# Patient Record
Sex: Female | Born: 1975
Health system: Southern US, Community
[De-identification: ages and names within clinical notes are randomized; demographics above are authoritative.]

## PROBLEM LIST (undated history)

## (undated) DIAGNOSIS — L409 Psoriasis, unspecified: Secondary | ICD-10-CM

## (undated) DIAGNOSIS — R87629 Unspecified abnormal cytological findings in specimens from vagina: Secondary | ICD-10-CM

## (undated) DIAGNOSIS — J45909 Unspecified asthma, uncomplicated: Secondary | ICD-10-CM

## (undated) DIAGNOSIS — R0602 Shortness of breath: Secondary | ICD-10-CM

## (undated) DIAGNOSIS — T8859XA Other complications of anesthesia, initial encounter: Secondary | ICD-10-CM

## (undated) DIAGNOSIS — K219 Gastro-esophageal reflux disease without esophagitis: Secondary | ICD-10-CM

## (undated) DIAGNOSIS — M1990 Unspecified osteoarthritis, unspecified site: Secondary | ICD-10-CM

## (undated) DIAGNOSIS — E119 Type 2 diabetes mellitus without complications: Secondary | ICD-10-CM

## (undated) DIAGNOSIS — N3010 Interstitial cystitis (chronic) without hematuria: Secondary | ICD-10-CM

## (undated) DIAGNOSIS — Z8669 Personal history of other diseases of the nervous system and sense organs: Secondary | ICD-10-CM

## (undated) DIAGNOSIS — T7840XA Allergy, unspecified, initial encounter: Secondary | ICD-10-CM

## (undated) DIAGNOSIS — J302 Other seasonal allergic rhinitis: Secondary | ICD-10-CM

## (undated) DIAGNOSIS — R011 Cardiac murmur, unspecified: Secondary | ICD-10-CM

## (undated) DIAGNOSIS — D860 Sarcoidosis of lung: Secondary | ICD-10-CM

## (undated) DIAGNOSIS — Z87442 Personal history of urinary calculi: Secondary | ICD-10-CM

## (undated) DIAGNOSIS — D219 Benign neoplasm of connective and other soft tissue, unspecified: Secondary | ICD-10-CM

## (undated) DIAGNOSIS — E785 Hyperlipidemia, unspecified: Secondary | ICD-10-CM

## (undated) DIAGNOSIS — Z8719 Personal history of other diseases of the digestive system: Secondary | ICD-10-CM

## (undated) DIAGNOSIS — L309 Dermatitis, unspecified: Secondary | ICD-10-CM

## (undated) DIAGNOSIS — N63 Unspecified lump in unspecified breast: Secondary | ICD-10-CM

## (undated) DIAGNOSIS — F419 Anxiety disorder, unspecified: Secondary | ICD-10-CM

## (undated) HISTORY — DX: Benign neoplasm of connective and other soft tissue, unspecified: D21.9

## (undated) HISTORY — DX: Hyperlipidemia, unspecified: E78.5

## (undated) HISTORY — DX: Unspecified abnormal cytological findings in specimens from vagina: R87.629

## (undated) HISTORY — DX: Other seasonal allergic rhinitis: J30.2

## (undated) HISTORY — DX: Dermatitis, unspecified: L30.9

## (undated) HISTORY — DX: Allergy, unspecified, initial encounter: T78.40XA

## (undated) HISTORY — DX: Anxiety disorder, unspecified: F41.9

## (undated) HISTORY — PX: BREAST CYST ASPIRATION: SHX578

---

## 1995-12-28 HISTORY — PX: DILATION AND CURETTAGE OF UTERUS: SHX78

## 1998-08-20 ENCOUNTER — Emergency Department (HOSPITAL_COMMUNITY): Admission: EM | Admit: 1998-08-20 | Discharge: 1998-08-20 | Payer: Self-pay

## 1998-09-28 ENCOUNTER — Emergency Department (HOSPITAL_COMMUNITY): Admission: EM | Admit: 1998-09-28 | Discharge: 1998-09-28 | Payer: Self-pay | Admitting: Emergency Medicine

## 1998-12-26 ENCOUNTER — Emergency Department (HOSPITAL_COMMUNITY): Admission: EM | Admit: 1998-12-26 | Discharge: 1998-12-26 | Payer: Self-pay | Admitting: Emergency Medicine

## 1999-07-03 ENCOUNTER — Emergency Department (HOSPITAL_COMMUNITY): Admission: EM | Admit: 1999-07-03 | Discharge: 1999-07-03 | Payer: Self-pay

## 1999-09-07 ENCOUNTER — Emergency Department (HOSPITAL_COMMUNITY): Admission: EM | Admit: 1999-09-07 | Discharge: 1999-09-07 | Payer: Self-pay | Admitting: Emergency Medicine

## 1999-09-07 ENCOUNTER — Encounter: Payer: Self-pay | Admitting: Emergency Medicine

## 2000-04-10 ENCOUNTER — Encounter: Payer: Self-pay | Admitting: Emergency Medicine

## 2000-04-10 ENCOUNTER — Emergency Department (HOSPITAL_COMMUNITY): Admission: EM | Admit: 2000-04-10 | Discharge: 2000-04-10 | Payer: Self-pay | Admitting: Emergency Medicine

## 2000-04-11 ENCOUNTER — Emergency Department (HOSPITAL_COMMUNITY): Admission: EM | Admit: 2000-04-11 | Discharge: 2000-04-11 | Payer: Self-pay | Admitting: Emergency Medicine

## 2000-04-16 ENCOUNTER — Emergency Department (HOSPITAL_COMMUNITY): Admission: EM | Admit: 2000-04-16 | Discharge: 2000-04-16 | Payer: Self-pay

## 2000-06-20 ENCOUNTER — Emergency Department (HOSPITAL_COMMUNITY): Admission: EM | Admit: 2000-06-20 | Discharge: 2000-06-20 | Payer: Self-pay

## 2000-12-13 ENCOUNTER — Emergency Department (HOSPITAL_COMMUNITY): Admission: EM | Admit: 2000-12-13 | Discharge: 2000-12-13 | Payer: Self-pay | Admitting: *Deleted

## 2000-12-15 ENCOUNTER — Emergency Department (HOSPITAL_COMMUNITY): Admission: EM | Admit: 2000-12-15 | Discharge: 2000-12-15 | Payer: Self-pay | Admitting: Emergency Medicine

## 2001-03-06 ENCOUNTER — Emergency Department (HOSPITAL_COMMUNITY): Admission: EM | Admit: 2001-03-06 | Discharge: 2001-03-06 | Payer: Self-pay | Admitting: Emergency Medicine

## 2002-05-23 ENCOUNTER — Emergency Department (HOSPITAL_COMMUNITY): Admission: EM | Admit: 2002-05-23 | Discharge: 2002-05-23 | Payer: Self-pay | Admitting: Emergency Medicine

## 2002-08-09 ENCOUNTER — Inpatient Hospital Stay (HOSPITAL_COMMUNITY): Admission: AD | Admit: 2002-08-09 | Discharge: 2002-08-09 | Payer: Self-pay | Admitting: *Deleted

## 2003-09-12 ENCOUNTER — Emergency Department (HOSPITAL_COMMUNITY): Admission: EM | Admit: 2003-09-12 | Discharge: 2003-09-12 | Payer: Self-pay | Admitting: Emergency Medicine

## 2004-03-29 ENCOUNTER — Emergency Department (HOSPITAL_COMMUNITY): Admission: AD | Admit: 2004-03-29 | Discharge: 2004-03-29 | Payer: Self-pay | Admitting: Family Medicine

## 2004-07-31 ENCOUNTER — Emergency Department (HOSPITAL_COMMUNITY): Admission: EM | Admit: 2004-07-31 | Discharge: 2004-07-31 | Payer: Self-pay | Admitting: Family Medicine

## 2004-08-23 ENCOUNTER — Emergency Department (HOSPITAL_COMMUNITY): Admission: EM | Admit: 2004-08-23 | Discharge: 2004-08-23 | Payer: Self-pay | Admitting: Emergency Medicine

## 2004-08-23 ENCOUNTER — Inpatient Hospital Stay (HOSPITAL_COMMUNITY): Admission: AD | Admit: 2004-08-23 | Discharge: 2004-08-23 | Payer: Self-pay | Admitting: Obstetrics and Gynecology

## 2004-10-06 ENCOUNTER — Emergency Department (HOSPITAL_COMMUNITY): Admission: EM | Admit: 2004-10-06 | Discharge: 2004-10-06 | Payer: Self-pay | Admitting: Emergency Medicine

## 2004-10-19 ENCOUNTER — Emergency Department (HOSPITAL_COMMUNITY): Admission: EM | Admit: 2004-10-19 | Discharge: 2004-10-19 | Payer: Self-pay | Admitting: Emergency Medicine

## 2004-11-04 ENCOUNTER — Emergency Department (HOSPITAL_COMMUNITY): Admission: EM | Admit: 2004-11-04 | Discharge: 2004-11-04 | Payer: Self-pay | Admitting: Emergency Medicine

## 2004-12-25 ENCOUNTER — Emergency Department (HOSPITAL_COMMUNITY): Admission: EM | Admit: 2004-12-25 | Discharge: 2004-12-25 | Payer: Self-pay | Admitting: Emergency Medicine

## 2004-12-30 ENCOUNTER — Ambulatory Visit (HOSPITAL_COMMUNITY): Admission: RE | Admit: 2004-12-30 | Discharge: 2004-12-30 | Payer: Self-pay | Admitting: Family Medicine

## 2004-12-30 ENCOUNTER — Emergency Department (HOSPITAL_COMMUNITY): Admission: EM | Admit: 2004-12-30 | Discharge: 2004-12-30 | Payer: Self-pay | Admitting: Family Medicine

## 2005-01-07 ENCOUNTER — Ambulatory Visit: Payer: Self-pay | Admitting: Internal Medicine

## 2005-02-12 ENCOUNTER — Emergency Department (HOSPITAL_COMMUNITY): Admission: EM | Admit: 2005-02-12 | Discharge: 2005-02-12 | Payer: Self-pay | Admitting: Emergency Medicine

## 2005-03-16 ENCOUNTER — Ambulatory Visit: Payer: Self-pay | Admitting: Internal Medicine

## 2005-04-13 ENCOUNTER — Ambulatory Visit: Payer: Self-pay | Admitting: Internal Medicine

## 2005-11-20 ENCOUNTER — Emergency Department (HOSPITAL_COMMUNITY): Admission: EM | Admit: 2005-11-20 | Discharge: 2005-11-20 | Payer: Self-pay | Admitting: Emergency Medicine

## 2006-01-06 ENCOUNTER — Ambulatory Visit: Payer: Self-pay | Admitting: Internal Medicine

## 2006-01-10 ENCOUNTER — Ambulatory Visit: Payer: Self-pay | Admitting: *Deleted

## 2006-01-31 ENCOUNTER — Emergency Department (HOSPITAL_COMMUNITY): Admission: EM | Admit: 2006-01-31 | Discharge: 2006-01-31 | Payer: Self-pay | Admitting: Emergency Medicine

## 2006-02-25 ENCOUNTER — Inpatient Hospital Stay (HOSPITAL_COMMUNITY): Admission: AD | Admit: 2006-02-25 | Discharge: 2006-02-25 | Payer: Self-pay | Admitting: *Deleted

## 2006-02-28 ENCOUNTER — Ambulatory Visit: Payer: Self-pay | Admitting: Internal Medicine

## 2006-03-04 ENCOUNTER — Encounter: Admission: RE | Admit: 2006-03-04 | Discharge: 2006-03-04 | Payer: Self-pay | Admitting: Family Medicine

## 2006-04-15 ENCOUNTER — Encounter: Admission: RE | Admit: 2006-04-15 | Discharge: 2006-04-15 | Payer: Self-pay | Admitting: Internal Medicine

## 2006-05-09 ENCOUNTER — Emergency Department (HOSPITAL_COMMUNITY): Admission: EM | Admit: 2006-05-09 | Discharge: 2006-05-09 | Payer: Self-pay | Admitting: Family Medicine

## 2006-06-24 ENCOUNTER — Emergency Department (HOSPITAL_COMMUNITY): Admission: EM | Admit: 2006-06-24 | Discharge: 2006-06-24 | Payer: Self-pay | Admitting: Emergency Medicine

## 2006-09-12 ENCOUNTER — Emergency Department (HOSPITAL_COMMUNITY): Admission: EM | Admit: 2006-09-12 | Discharge: 2006-09-12 | Payer: Self-pay | Admitting: Family Medicine

## 2006-09-17 ENCOUNTER — Emergency Department (HOSPITAL_COMMUNITY): Admission: EM | Admit: 2006-09-17 | Discharge: 2006-09-17 | Payer: Self-pay | Admitting: Emergency Medicine

## 2006-10-24 ENCOUNTER — Emergency Department (HOSPITAL_COMMUNITY): Admission: EM | Admit: 2006-10-24 | Discharge: 2006-10-24 | Payer: Self-pay | Admitting: Emergency Medicine

## 2006-12-01 ENCOUNTER — Ambulatory Visit: Payer: Self-pay | Admitting: Internal Medicine

## 2006-12-11 ENCOUNTER — Emergency Department (HOSPITAL_COMMUNITY): Admission: EM | Admit: 2006-12-11 | Discharge: 2006-12-11 | Payer: Self-pay | Admitting: Emergency Medicine

## 2007-01-27 ENCOUNTER — Emergency Department (HOSPITAL_COMMUNITY): Admission: EM | Admit: 2007-01-27 | Discharge: 2007-01-27 | Payer: Self-pay | Admitting: Family Medicine

## 2007-03-24 ENCOUNTER — Emergency Department (HOSPITAL_COMMUNITY): Admission: EM | Admit: 2007-03-24 | Discharge: 2007-03-24 | Payer: Self-pay | Admitting: Emergency Medicine

## 2007-04-10 ENCOUNTER — Emergency Department (HOSPITAL_COMMUNITY): Admission: EM | Admit: 2007-04-10 | Discharge: 2007-04-10 | Payer: Self-pay | Admitting: Emergency Medicine

## 2007-10-28 ENCOUNTER — Emergency Department (HOSPITAL_COMMUNITY): Admission: EM | Admit: 2007-10-28 | Discharge: 2007-10-28 | Payer: Self-pay | Admitting: Emergency Medicine

## 2007-11-24 ENCOUNTER — Emergency Department (HOSPITAL_COMMUNITY): Admission: EM | Admit: 2007-11-24 | Discharge: 2007-11-24 | Payer: Self-pay | Admitting: Emergency Medicine

## 2008-01-29 ENCOUNTER — Ambulatory Visit: Payer: Self-pay | Admitting: Family Medicine

## 2008-02-05 ENCOUNTER — Ambulatory Visit: Payer: Self-pay | Admitting: Internal Medicine

## 2008-02-05 LAB — CONVERTED CEMR LAB
ALT: 31 units/L (ref 0–35)
Albumin: 4.3 g/dL (ref 3.5–5.2)
Amphetamine Screen, Ur: NEGATIVE
Barbiturate Quant, Ur: NEGATIVE
Benzodiazepines.: NEGATIVE
Chloride: 103 meq/L (ref 96–112)
Creatinine, Ser: 0.61 mg/dL (ref 0.40–1.20)
Creatinine,U: 255.7 mg/dL
Eosinophils Absolute: 0.4 10*3/uL (ref 0.0–0.7)
HCT: 36.1 % (ref 36.0–46.0)
Hemoglobin: 11.6 g/dL — ABNORMAL LOW (ref 12.0–15.0)
Lymphocytes Relative: 20 % (ref 12–46)
Lymphs Abs: 0.9 10*3/uL (ref 0.7–4.0)
MCV: 80.6 fL (ref 78.0–100.0)
Marijuana Metabolite: NEGATIVE
Monocytes Absolute: 0.4 10*3/uL (ref 0.1–1.0)
Phencyclidine (PCP): NEGATIVE
Platelets: 324 10*3/uL (ref 150–400)
Potassium: 3.7 meq/L (ref 3.5–5.3)
RDW: 13.6 % (ref 11.5–15.5)
Sodium: 138 meq/L (ref 135–145)
Total Protein: 8.2 g/dL (ref 6.0–8.3)

## 2008-03-11 ENCOUNTER — Ambulatory Visit: Payer: Self-pay | Admitting: Internal Medicine

## 2008-04-05 ENCOUNTER — Emergency Department (HOSPITAL_COMMUNITY): Admission: EM | Admit: 2008-04-05 | Discharge: 2008-04-05 | Payer: Self-pay | Admitting: Family Medicine

## 2008-04-15 ENCOUNTER — Encounter: Payer: Self-pay | Admitting: Family Medicine

## 2008-04-15 ENCOUNTER — Ambulatory Visit: Payer: Self-pay | Admitting: Internal Medicine

## 2008-04-15 LAB — CONVERTED CEMR LAB
AST: 12 units/L (ref 0–37)
Alkaline Phosphatase: 53 units/L (ref 39–117)
BUN: 18 mg/dL (ref 6–23)
Glucose, Bld: 83 mg/dL (ref 70–99)
Potassium: 3.8 meq/L (ref 3.5–5.3)

## 2008-04-24 ENCOUNTER — Emergency Department (HOSPITAL_COMMUNITY): Admission: EM | Admit: 2008-04-24 | Discharge: 2008-04-25 | Payer: Self-pay | Admitting: Emergency Medicine

## 2008-05-22 ENCOUNTER — Ambulatory Visit: Payer: Self-pay | Admitting: Internal Medicine

## 2008-05-22 ENCOUNTER — Encounter: Payer: Self-pay | Admitting: Family Medicine

## 2008-05-22 LAB — CONVERTED CEMR LAB
AST: 17 units/L (ref 0–37)
Albumin: 4.2 g/dL (ref 3.5–5.2)
Anti Nuclear Antibody(ANA): NEGATIVE
CO2: 23 meq/L (ref 19–32)
Creatinine, Ser: 0.65 mg/dL (ref 0.40–1.20)
Hep B Core Total Ab: NEGATIVE
Hep B S Ab: POSITIVE — AB
Potassium: 3.5 meq/L (ref 3.5–5.3)
Sodium: 141 meq/L (ref 135–145)
Total Protein: 7 g/dL (ref 6.0–8.3)

## 2008-06-12 ENCOUNTER — Ambulatory Visit: Payer: Self-pay | Admitting: Internal Medicine

## 2008-07-03 ENCOUNTER — Ambulatory Visit: Payer: Self-pay | Admitting: Emergency Medicine

## 2008-07-03 DIAGNOSIS — E119 Type 2 diabetes mellitus without complications: Secondary | ICD-10-CM | POA: Insufficient documentation

## 2008-07-03 DIAGNOSIS — D869 Sarcoidosis, unspecified: Secondary | ICD-10-CM

## 2008-07-04 ENCOUNTER — Ambulatory Visit: Payer: Self-pay | Admitting: Emergency Medicine

## 2008-07-08 ENCOUNTER — Ambulatory Visit: Payer: Self-pay | Admitting: Cardiology

## 2008-07-15 ENCOUNTER — Encounter: Payer: Self-pay | Admitting: Family Medicine

## 2008-07-15 ENCOUNTER — Ambulatory Visit: Payer: Self-pay | Admitting: Internal Medicine

## 2008-07-15 LAB — CONVERTED CEMR LAB
ALT: 29 units/L (ref 0–35)
AST: 15 units/L (ref 0–37)
Alkaline Phosphatase: 47 units/L (ref 39–117)
Calcium: 9.2 mg/dL (ref 8.4–10.5)
Creatinine, Ser: 0.63 mg/dL (ref 0.40–1.20)
Glucose, Bld: 133 mg/dL — ABNORMAL HIGH (ref 70–99)
Hemoglobin: 12 g/dL (ref 12.0–15.0)
Lymphs Abs: 1.3 10*3/uL (ref 0.7–4.0)
MCHC: 32.4 g/dL (ref 30.0–36.0)
Neutrophils Relative %: 71 % (ref 43–77)
Potassium: 3.5 meq/L (ref 3.5–5.3)
RBC: 4.14 M/uL (ref 3.87–5.11)
Sodium: 140 meq/L (ref 135–145)
Total Bilirubin: 0.8 mg/dL (ref 0.3–1.2)
Total Protein: 6.5 g/dL (ref 6.0–8.3)
WBC: 6.5 10*3/uL (ref 4.0–10.5)

## 2008-07-17 ENCOUNTER — Ambulatory Visit: Payer: Self-pay | Admitting: Internal Medicine

## 2008-07-17 ENCOUNTER — Encounter: Payer: Self-pay | Admitting: Emergency Medicine

## 2008-07-18 ENCOUNTER — Ambulatory Visit (HOSPITAL_COMMUNITY): Admission: RE | Admit: 2008-07-18 | Discharge: 2008-07-18 | Payer: Self-pay | Admitting: Family Medicine

## 2008-08-14 ENCOUNTER — Ambulatory Visit: Payer: Self-pay | Admitting: Emergency Medicine

## 2008-08-14 LAB — CONVERTED CEMR LAB: ds DNA Ab: 1 (ref <5)

## 2008-08-23 ENCOUNTER — Emergency Department (HOSPITAL_COMMUNITY): Admission: EM | Admit: 2008-08-23 | Discharge: 2008-08-23 | Payer: Self-pay | Admitting: Family Medicine

## 2008-08-26 ENCOUNTER — Ambulatory Visit: Payer: Self-pay | Admitting: Internal Medicine

## 2008-09-19 ENCOUNTER — Ambulatory Visit: Payer: Self-pay | Admitting: Family Medicine

## 2008-09-25 ENCOUNTER — Ambulatory Visit: Payer: Self-pay | Admitting: Emergency Medicine

## 2008-09-27 ENCOUNTER — Ambulatory Visit: Payer: Self-pay | Admitting: Family Medicine

## 2008-10-03 ENCOUNTER — Ambulatory Visit: Payer: Self-pay | Admitting: Internal Medicine

## 2008-10-22 ENCOUNTER — Ambulatory Visit (HOSPITAL_COMMUNITY): Admission: RE | Admit: 2008-10-22 | Discharge: 2008-10-22 | Payer: Self-pay | Admitting: Family Medicine

## 2008-10-23 ENCOUNTER — Ambulatory Visit: Payer: Self-pay | Admitting: Family Medicine

## 2008-10-25 ENCOUNTER — Ambulatory Visit: Payer: Self-pay | Admitting: Emergency Medicine

## 2008-11-11 ENCOUNTER — Ambulatory Visit: Payer: Self-pay | Admitting: Internal Medicine

## 2008-11-28 ENCOUNTER — Ambulatory Visit: Payer: Self-pay | Admitting: Internal Medicine

## 2008-12-25 ENCOUNTER — Emergency Department (HOSPITAL_COMMUNITY): Admission: EM | Admit: 2008-12-25 | Discharge: 2008-12-25 | Payer: Self-pay | Admitting: Emergency Medicine

## 2009-01-02 ENCOUNTER — Emergency Department (HOSPITAL_COMMUNITY): Admission: EM | Admit: 2009-01-02 | Discharge: 2009-01-02 | Payer: Self-pay | Admitting: Emergency Medicine

## 2009-01-10 ENCOUNTER — Ambulatory Visit: Payer: Self-pay | Admitting: Emergency Medicine

## 2009-01-14 ENCOUNTER — Ambulatory Visit: Payer: Self-pay | Admitting: Internal Medicine

## 2009-01-14 ENCOUNTER — Encounter: Payer: Self-pay | Admitting: Family Medicine

## 2009-01-14 LAB — CONVERTED CEMR LAB
ALT: 15 units/L (ref 0–35)
Albumin: 4 g/dL (ref 3.5–5.2)
Alkaline Phosphatase: 47 units/L (ref 39–117)
Chloride: 108 meq/L (ref 96–112)
Eosinophils Absolute: 0.2 10*3/uL (ref 0.0–0.7)
Eosinophils Relative: 4 % (ref 0–5)
Glucose, Bld: 110 mg/dL — ABNORMAL HIGH (ref 70–99)
HCT: 36.3 % (ref 36.0–46.0)
Hemoglobin: 11.3 g/dL — ABNORMAL LOW (ref 12.0–15.0)
Lymphocytes Relative: 19 % (ref 12–46)
MCHC: 31.1 g/dL (ref 30.0–36.0)
Monocytes Absolute: 0.6 10*3/uL (ref 0.1–1.0)
Platelets: 300 10*3/uL (ref 150–400)
TSH: 1.228 microintl units/mL (ref 0.350–4.50)
Total Protein: 6.9 g/dL (ref 6.0–8.3)

## 2009-01-22 ENCOUNTER — Ambulatory Visit: Payer: Self-pay | Admitting: Internal Medicine

## 2009-02-03 ENCOUNTER — Encounter: Payer: Self-pay | Admitting: Family Medicine

## 2009-02-03 ENCOUNTER — Ambulatory Visit: Payer: Self-pay | Admitting: Internal Medicine

## 2009-02-03 LAB — CONVERTED CEMR LAB
Chlamydia, DNA Probe: NEGATIVE
GC Probe Amp, Genital: NEGATIVE

## 2009-04-09 ENCOUNTER — Ambulatory Visit: Payer: Self-pay | Admitting: Emergency Medicine

## 2009-04-20 ENCOUNTER — Emergency Department (HOSPITAL_COMMUNITY): Admission: EM | Admit: 2009-04-20 | Discharge: 2009-04-20 | Payer: Self-pay | Admitting: Family Medicine

## 2009-05-21 ENCOUNTER — Ambulatory Visit: Payer: Self-pay | Admitting: Emergency Medicine

## 2009-05-21 DIAGNOSIS — R21 Rash and other nonspecific skin eruption: Secondary | ICD-10-CM

## 2009-06-02 ENCOUNTER — Ambulatory Visit: Payer: Self-pay | Admitting: Internal Medicine

## 2009-06-02 ENCOUNTER — Encounter: Payer: Self-pay | Admitting: Family Medicine

## 2009-06-02 LAB — CONVERTED CEMR LAB: Microalb, Ur: 0.5 mg/dL (ref 0.00–1.89)

## 2009-07-01 ENCOUNTER — Ambulatory Visit: Payer: Self-pay | Admitting: Internal Medicine

## 2009-07-22 ENCOUNTER — Emergency Department (HOSPITAL_COMMUNITY): Admission: EM | Admit: 2009-07-22 | Discharge: 2009-07-23 | Payer: Self-pay | Admitting: Emergency Medicine

## 2009-08-13 ENCOUNTER — Emergency Department (HOSPITAL_COMMUNITY): Admission: EM | Admit: 2009-08-13 | Discharge: 2009-08-13 | Payer: Self-pay | Admitting: Family Medicine

## 2009-08-20 ENCOUNTER — Ambulatory Visit: Payer: Self-pay | Admitting: Internal Medicine

## 2009-08-20 ENCOUNTER — Encounter: Payer: Self-pay | Admitting: Family Medicine

## 2009-08-20 LAB — CONVERTED CEMR LAB
ALT: 16 units/L (ref 0–35)
Albumin: 4.4 g/dL (ref 3.5–5.2)
Alkaline Phosphatase: 58 units/L (ref 39–117)
CO2: 23 meq/L (ref 19–32)
Calcium: 9.7 mg/dL (ref 8.4–10.5)
Chloride: 104 meq/L (ref 96–112)
Creatinine, Ser: 0.79 mg/dL (ref 0.40–1.20)
HCT: 36.5 % (ref 36.0–46.0)
Hemoglobin: 11.6 g/dL — ABNORMAL LOW (ref 12.0–15.0)
MCHC: 31.8 g/dL (ref 30.0–36.0)
MCV: 82.8 fL (ref 78.0–100.0)
Monocytes Absolute: 0.6 10*3/uL (ref 0.1–1.0)
Potassium: 3.7 meq/L (ref 3.5–5.3)
RBC: 4.41 M/uL (ref 3.87–5.11)
Sodium: 140 meq/L (ref 135–145)
Total Bilirubin: 1 mg/dL (ref 0.3–1.2)
Total Protein: 7.9 g/dL (ref 6.0–8.3)
WBC: 5.1 10*3/uL (ref 4.0–10.5)

## 2009-08-29 ENCOUNTER — Emergency Department (HOSPITAL_COMMUNITY): Admission: EM | Admit: 2009-08-29 | Discharge: 2009-08-29 | Payer: Self-pay | Admitting: Emergency Medicine

## 2009-09-29 ENCOUNTER — Ambulatory Visit: Payer: Self-pay | Admitting: Internal Medicine

## 2009-10-13 ENCOUNTER — Emergency Department (HOSPITAL_COMMUNITY): Admission: EM | Admit: 2009-10-13 | Discharge: 2009-10-13 | Payer: Self-pay | Admitting: Emergency Medicine

## 2009-11-03 ENCOUNTER — Ambulatory Visit: Payer: Self-pay | Admitting: Family Medicine

## 2009-11-03 LAB — CONVERTED CEMR LAB
Albumin: 4.3 g/dL (ref 3.5–5.2)
Alkaline Phosphatase: 50 units/L (ref 39–117)
Basophils Relative: 1 % (ref 0–1)
CO2: 21 meq/L (ref 19–32)
Calcium: 9.2 mg/dL (ref 8.4–10.5)
Creatinine, Ser: 0.77 mg/dL (ref 0.40–1.20)
Eosinophils Absolute: 0.1 10*3/uL (ref 0.0–0.7)
Folate: >20 ng/mL
Glucose, Bld: 95 mg/dL (ref 70–99)
HDL: 48 mg/dL (ref >39)
Hemoglobin: 11.2 g/dL — ABNORMAL LOW (ref 12.0–15.0)
Iron: 29 ug/dL — ABNORMAL LOW (ref 42–145)
Lymphocytes Relative: 22 % (ref 12–46)
Monocytes Relative: 10 % (ref 3–12)
Neutro Abs: 2.6 10*3/uL (ref 1.7–7.7)
Neutrophils Relative %: 64 % (ref 43–77)
Total Bilirubin: 0.5 mg/dL (ref 0.3–1.2)
WBC: 4 10*3/uL (ref 4.0–10.5)

## 2009-12-04 ENCOUNTER — Ambulatory Visit: Payer: Self-pay | Admitting: Family Medicine

## 2010-02-05 ENCOUNTER — Ambulatory Visit: Payer: Self-pay | Admitting: Internal Medicine

## 2010-03-19 ENCOUNTER — Ambulatory Visit: Payer: Self-pay | Admitting: Internal Medicine

## 2010-03-26 ENCOUNTER — Inpatient Hospital Stay (HOSPITAL_COMMUNITY): Admission: AD | Admit: 2010-03-26 | Discharge: 2010-03-26 | Payer: Self-pay | Admitting: Family Medicine

## 2010-04-10 ENCOUNTER — Ambulatory Visit: Payer: Self-pay | Admitting: Internal Medicine

## 2010-05-04 ENCOUNTER — Ambulatory Visit: Payer: Self-pay | Admitting: Internal Medicine

## 2010-05-08 ENCOUNTER — Ambulatory Visit: Payer: Self-pay | Admitting: Internal Medicine

## 2010-05-13 ENCOUNTER — Ambulatory Visit: Payer: Self-pay | Admitting: Obstetrics and Gynecology

## 2010-05-29 ENCOUNTER — Ambulatory Visit: Payer: Self-pay | Admitting: Internal Medicine

## 2010-06-09 ENCOUNTER — Ambulatory Visit (HOSPITAL_BASED_OUTPATIENT_CLINIC_OR_DEPARTMENT_OTHER): Admission: RE | Admit: 2010-06-09 | Discharge: 2010-06-09 | Payer: Self-pay | Admitting: Urology

## 2010-06-09 HISTORY — PX: OTHER SURGICAL HISTORY: SHX169

## 2010-06-22 ENCOUNTER — Ambulatory Visit: Payer: Self-pay | Admitting: Internal Medicine

## 2010-06-22 LAB — CONVERTED CEMR LAB
AST: 16 units/L (ref 0–37)
Alkaline Phosphatase: 60 units/L (ref 39–117)
Angiotensin 1 Converting Enzyme: 67 units/L (ref 9–67)
BUN: 8 mg/dL (ref 6–23)
CO2: 25 meq/L (ref 19–32)
CRP: 0.5 mg/dL (ref <0.6)
Chloride: 105 meq/L (ref 96–112)
Creatinine, Ser: 0.57 mg/dL (ref 0.40–1.20)
Hep A Total Ab: NEGATIVE
Hep B S Ab: POSITIVE — AB
Hgb A1c MFr Bld: 5.5 % (ref <5.7)
Total Bilirubin: 0.9 mg/dL (ref 0.3–1.2)
Total CK: 58 units/L (ref 7–177)

## 2010-07-16 ENCOUNTER — Ambulatory Visit: Payer: Self-pay | Admitting: Internal Medicine

## 2010-07-29 ENCOUNTER — Ambulatory Visit: Payer: Self-pay | Admitting: Obstetrics and Gynecology

## 2010-08-19 ENCOUNTER — Ambulatory Visit: Payer: Self-pay | Admitting: Obstetrics and Gynecology

## 2010-09-03 ENCOUNTER — Emergency Department (HOSPITAL_COMMUNITY): Admission: EM | Admit: 2010-09-03 | Discharge: 2010-09-03 | Payer: Self-pay | Admitting: Family Medicine

## 2010-11-10 ENCOUNTER — Emergency Department (HOSPITAL_COMMUNITY): Admission: EM | Admit: 2010-11-10 | Discharge: 2010-11-10 | Payer: Self-pay | Admitting: Family Medicine

## 2010-12-30 ENCOUNTER — Emergency Department (HOSPITAL_COMMUNITY)
Admission: EM | Admit: 2010-12-30 | Discharge: 2010-12-30 | Payer: Self-pay | Source: Home / Self Care | Admitting: Family Medicine

## 2011-01-15 ENCOUNTER — Emergency Department (HOSPITAL_COMMUNITY)
Admission: EM | Admit: 2011-01-15 | Discharge: 2011-01-15 | Payer: Self-pay | Source: Home / Self Care | Admitting: Family Medicine

## 2011-01-18 LAB — POCT URINALYSIS DIPSTICK
Bilirubin Urine: NEGATIVE
Nitrite: NEGATIVE
Protein, ur: NEGATIVE mg/dL
Specific Gravity, Urine: 1.02 (ref 1.005–1.030)
pH: 7 (ref 5.0–8.0)

## 2011-03-11 LAB — POCT RAPID STREP A (OFFICE): Streptococcus, Group A Screen (Direct): NEGATIVE

## 2011-03-15 LAB — POCT PREGNANCY, URINE: Preg Test, Ur: NEGATIVE

## 2011-03-21 LAB — DIFFERENTIAL
Basophils Absolute: 0 10*3/uL (ref 0.0–0.1)
Basophils Relative: 1 % (ref 0–1)
Monocytes Relative: 8 % (ref 3–12)
Neutro Abs: 3.3 10*3/uL (ref 1.7–7.7)
Neutrophils Relative %: 63 % (ref 43–77)

## 2011-03-21 LAB — WET PREP, GENITAL
Clue Cells Wet Prep HPF POC: NONE SEEN
Trich, Wet Prep: NONE SEEN

## 2011-03-21 LAB — CBC
MCHC: 33.6 g/dL (ref 30.0–36.0)
Platelets: 320 10*3/uL (ref 150–400)
RBC: 4.38 MIL/uL (ref 3.87–5.11)

## 2011-03-21 LAB — URINALYSIS, ROUTINE W REFLEX MICROSCOPIC
Bilirubin Urine: NEGATIVE
Hgb urine dipstick: NEGATIVE
Protein, ur: NEGATIVE mg/dL
Specific Gravity, Urine: 1.015 (ref 1.005–1.030)
Urobilinogen, UA: 0.2 mg/dL (ref 0.0–1.0)

## 2011-03-21 LAB — GC/CHLAMYDIA PROBE AMP, GENITAL: GC Probe Amp, Genital: NEGATIVE

## 2011-04-01 LAB — DIFFERENTIAL
Basophils Absolute: 0 10*3/uL (ref 0.0–0.1)
Basophils Relative: 1 % (ref 0–1)
Lymphocytes Relative: 16 % (ref 12–46)
Monocytes Absolute: 0.5 10*3/uL (ref 0.1–1.0)
Monocytes Relative: 10 % (ref 3–12)
Neutro Abs: 3.5 10*3/uL (ref 1.7–7.7)
Neutrophils Relative %: 71 % (ref 43–77)

## 2011-04-01 LAB — COMPREHENSIVE METABOLIC PANEL
ALT: 20 U/L (ref 0–35)
AST: 21 U/L (ref 0–37)
Albumin: 3.9 g/dL (ref 3.5–5.2)
Alkaline Phosphatase: 50 U/L (ref 39–117)
CO2: 26 mEq/L (ref 19–32)
Chloride: 104 mEq/L (ref 96–112)
Creatinine, Ser: 0.56 mg/dL (ref 0.4–1.2)
GFR calc Af Amer: >60 mL/min (ref >60)
GFR calc non Af Amer: >60 mL/min (ref >60)
Potassium: 3.6 mEq/L (ref 3.5–5.1)
Total Bilirubin: 1.2 mg/dL (ref 0.3–1.2)

## 2011-04-01 LAB — CBC
Hemoglobin: 11.8 g/dL — ABNORMAL LOW (ref 12.0–15.0)
RBC: 4.23 MIL/uL (ref 3.87–5.11)
RDW: 14.8 % (ref 11.5–15.5)

## 2011-04-01 LAB — URINALYSIS, ROUTINE W REFLEX MICROSCOPIC
Nitrite: NEGATIVE
Protein, ur: NEGATIVE mg/dL
Specific Gravity, Urine: 1.017 (ref 1.005–1.030)
Urobilinogen, UA: 0.2 mg/dL (ref 0.0–1.0)

## 2011-04-01 LAB — POCT I-STAT, CHEM 8
BUN: 5 mg/dL — ABNORMAL LOW (ref 6–23)
BUN: 8 mg/dL (ref 6–23)
Calcium, Ion: 1.11 mmol/L — ABNORMAL LOW (ref 1.12–1.32)
Calcium, Ion: 1.23 mmol/L (ref 1.12–1.32)
Chloride: 103 mEq/L (ref 96–112)
Chloride: 105 mEq/L (ref 96–112)
Creatinine, Ser: 0.5 mg/dL (ref 0.4–1.2)
Glucose, Bld: 79 mg/dL (ref 70–99)
HCT: 39 % (ref 36.0–46.0)
Potassium: 3.6 mEq/L (ref 3.5–5.1)
TCO2: 24 mmol/L (ref 0–100)

## 2011-04-01 LAB — POCT URINALYSIS DIP (DEVICE)
Bilirubin Urine: NEGATIVE
Glucose, UA: NEGATIVE mg/dL
Ketones, ur: NEGATIVE mg/dL
pH: 6.5 (ref 5.0–8.0)

## 2011-04-01 LAB — POCT PREGNANCY, URINE: Preg Test, Ur: NEGATIVE

## 2011-04-01 LAB — URINE MICROSCOPIC-ADD ON

## 2011-04-01 LAB — GC/CHLAMYDIA PROBE AMP, GENITAL
Chlamydia, DNA Probe: NEGATIVE
GC Probe Amp, Genital: NEGATIVE

## 2011-04-01 LAB — WET PREP, GENITAL

## 2011-04-01 LAB — PROTIME-INR: INR: 1.05 (ref 0.00–1.49)

## 2011-04-02 LAB — GC/CHLAMYDIA PROBE AMP, GENITAL: GC Probe Amp, Genital: NEGATIVE

## 2011-04-02 LAB — POCT URINALYSIS DIP (DEVICE)
Glucose, UA: NEGATIVE mg/dL
Nitrite: NEGATIVE
Protein, ur: 100 mg/dL — AB
Specific Gravity, Urine: >=1.03 (ref 1.005–1.030)
Urobilinogen, UA: 1 mg/dL (ref 0.0–1.0)
pH: 5.5 (ref 5.0–8.0)

## 2011-04-02 LAB — WET PREP, GENITAL

## 2011-05-14 NOTE — Assessment & Plan Note (Signed)
[facility]                             PULMONARY OFFICE NOTE   NAME:STEVENSONBlandina, Renaldo                    MRN:          [id number]  DATE:12/01/2006                            DOB:          1976/06/09    HISTORY:  This is a 35 year old black female seen in January 2006 for  left facial weakness consistent with a Bell's palsy associated with  probable neural sarcoid.  A tissue diagnosis was never made, but she had  classic chest x-ray findings, and had improved on prednisone by the time  I saw her.  I had recommended followup every 4 weeks when I last saw her  on April 09, 2005, but she never returned.  She does not remember how  long she took predinsone, but it was not for more than a couple of  months.  Since that time, she did well with no symptoms at all until  February 2007 when she developed rash over her abdomen which was itchy  and painful.  She has already been treated at [facility] for the  rash, but did not know what she took for it, and has not been back.  She  comes in now with increasing symptoms of coughing, dyspnea with  subjective wheeze over the last weeks.  No ocular or articular  complaints or new neurologic complaints, new rash, chest pains, fevers  or sweats.   PHYSICAL EXAMINATION:  She is a slightly hoarse ambulatory black female  in no acute distress.  She has stable vital signs with a weight of 166 pounds, actually up 22  pounds from previous visit.  HEENT:  Unremarkable.  Oropharynx clear.  Her facial muscles appear  symmetric.  NECK:  Supple without cervical adenopathy or tenderness.  Trachea is  midline.  No thyromegaly.  LUNG FIELDS:  Clear bilaterally to auscultation and percussion.  HEART:  Regular rhythm without murmur, gallop or rub.  ABDOMEN:  Soft and benign with a hyperpigmented area of maculopapular  nodular rash about the size of a palm located over the middle of her  abdomen.  There are no other areas of  significant rash.  EXTREMITIES:  Warm without calf tenderness, cyanosis or clubbing.   IMPRESSION:  1. Abdominal rash may well be sarcoid.  I have recommended Lotrisone      cream for now and return to [facility] for consideration for a      dermatologic evaluation.  2. Her symptoms are mostly upper airway in nature, and probably are      not related to sarcoid.  I recommended Tylenol No. 3 p.r.n. cough,      and we will restage her a CMET, CBC and ACE level with followup      here in 2 weeks.  3. She tells me she is having bloody stools when she should be having      her period.  The more I discussed this problem with her the more      confused I became as to exactly what she is talking about.  She      says she could not  be possibly pregnant because she has not had      intercourse in the last year, but does not understand where the      bleeding is coming from,      and only occurs monthly.  I have recommended [facility]      evaluation referral to GYN if needed.     Charlaine Dalton. Sherene Sires, MD, [facility]  Electronically Signed    MBW/MedQ  DD: 12/01/2006  DT: 12/01/2006  Job #: [id number]   cc:   [facility]

## 2011-06-03 ENCOUNTER — Institutional Professional Consult (permissible substitution): Payer: Self-pay | Admitting: Pulmonary Disease

## 2011-07-14 ENCOUNTER — Inpatient Hospital Stay (INDEPENDENT_AMBULATORY_CARE_PROVIDER_SITE_OTHER)
Admission: RE | Admit: 2011-07-14 | Discharge: 2011-07-14 | Disposition: A | Discharge status: Home / Self Care | Payer: Medicare Other | Source: Ambulatory Visit | Attending: Emergency Medicine | Admitting: Emergency Medicine

## 2011-07-14 DIAGNOSIS — G43009 Migraine without aura, not intractable, without status migrainosus: Secondary | ICD-10-CM

## 2011-07-20 ENCOUNTER — Encounter: Payer: Self-pay | Admitting: Pulmonary Disease

## 2011-07-22 ENCOUNTER — Ambulatory Visit (INDEPENDENT_AMBULATORY_CARE_PROVIDER_SITE_OTHER): Payer: Medicare Other | Admitting: Pulmonary Disease

## 2011-07-22 ENCOUNTER — Other Ambulatory Visit (INDEPENDENT_AMBULATORY_CARE_PROVIDER_SITE_OTHER): Payer: Medicare Other

## 2011-07-22 ENCOUNTER — Ambulatory Visit (INDEPENDENT_AMBULATORY_CARE_PROVIDER_SITE_OTHER)
Admission: RE | Admit: 2011-07-22 | Discharge: 2011-07-22 | Disposition: A | Discharge status: Home / Self Care | Payer: Medicare Other | Source: Ambulatory Visit | Attending: Pulmonary Disease | Admitting: Pulmonary Disease

## 2011-07-22 ENCOUNTER — Encounter: Payer: Self-pay | Admitting: Pulmonary Disease

## 2011-07-22 VITALS — BP 126/80 | HR 60 | Temp 98.5°F | Ht 69.0 in | Wt 198.8 lb

## 2011-07-22 DIAGNOSIS — J45909 Unspecified asthma, uncomplicated: Secondary | ICD-10-CM | POA: Insufficient documentation

## 2011-07-22 DIAGNOSIS — R0609 Other forms of dyspnea: Secondary | ICD-10-CM

## 2011-07-22 DIAGNOSIS — R21 Rash and other nonspecific skin eruption: Secondary | ICD-10-CM

## 2011-07-22 DIAGNOSIS — R0989 Other specified symptoms and signs involving the circulatory and respiratory systems: Secondary | ICD-10-CM

## 2011-07-22 DIAGNOSIS — R0600 Dyspnea, unspecified: Secondary | ICD-10-CM

## 2011-07-22 DIAGNOSIS — D869 Sarcoidosis, unspecified: Secondary | ICD-10-CM

## 2011-07-22 LAB — IGE: IgE (Immunoglobulin E), Serum: 71.8 IU/mL (ref 0.0–180.0)

## 2011-07-22 LAB — COMPREHENSIVE METABOLIC PANEL
AST: 18 U/L (ref 0–37)
Albumin: 4.3 g/dL (ref 3.5–5.2)
BUN: 6 mg/dL (ref 6–23)
Calcium: 9.1 mg/dL (ref 8.4–10.5)
Chloride: 101 mEq/L (ref 96–112)
Glucose, Bld: 85 mg/dL (ref 70–99)
Potassium: 3.5 mEq/L (ref 3.5–5.1)

## 2011-07-22 LAB — CBC WITH DIFFERENTIAL/PLATELET
Basophils Relative: 0.7 % (ref 0.0–3.0)
Eosinophils Relative: 5.9 % — ABNORMAL HIGH (ref 0.0–5.0)
Lymphocytes Relative: 24.1 % (ref 12.0–46.0)
Monocytes Relative: 8.1 % (ref 3.0–12.0)
Neutrophils Relative %: 61.2 % (ref 43.0–77.0)
RBC: 4.35 Mil/uL (ref 3.87–5.11)
WBC: 5.6 10*3/uL (ref 4.5–10.5)

## 2011-07-22 LAB — SEDIMENTATION RATE: Sed Rate: 45 mm/hr — ABNORMAL HIGH (ref 0–22)

## 2011-07-22 NOTE — Patient Instructions (Signed)
Chest xray and lab tests today Will schedule breathing test (PFT) Follow up in 2 weeks

## 2011-07-22 NOTE — Progress Notes (Signed)
Subjective:    Patient ID: Rhonda Hester, female    DOB: 09/10/1976, 35 y.o.   MRN: [id number]  HPI CC: [facility]  35 yo female with history of sarcoidosis for evaluation of cough and dyspnea.  She had left Bell's palsy associated with skin rash in 2006.  She had CT chest at that time and was told that she had sarcoidosis.  She never have tissue biopsy to confirm diagnosis.  She was followed by Dr. Sherene Sires in 2007 and was treated with prednisone.  She did not follow up pulmonary again until 2009 when she saw Dr. Delton Coombes.  She was treated with prednisone again.  There was concern from Dr. Delton Coombes about the patient's compliance and self-adjusting of her medications.  As a result Dr. Delton Coombes was reluctant to prescribe steroid sparing agents.  She was not seen in pulmonary office since 2010.  She has not been on prednisone recently.  She did feel like her breathing was better with prednisone.  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  She will also get short of breath and hear herself wheezing.  She is not bringing up much sputum.  She has been using ventolin, and this helps her wheeze/cough/breathing.  She has seasonal allergies, and transition of Spring to Summer seems to be the most difficult season for her.  She has been getting sinus congestion and drainage down her throat.  This seems to trigger her cough.  Seh stopped smoking two weeks ago.  She has pet dogs.  She denies recent travel or sick exposures.  She does get heartburn, and there was concern previously that her cough could be related to GERD.  She has some joint stiffness.  Past Medical History  Diagnosis Date  . Sarcoidosis 2006  . Seasonal allergic rhinitis   . Asthma   . Eczema   . Bell's palsy 2006     Family History  Problem Relation Age of Onset  . Brain cancer Maternal Grandmother      History   Social History  . Marital Status: Single    Spouse Name: N/A    Number of Children:  N/A  . Years of Education: N/A   Occupational History  . Not on file.   Social History Main Topics  . Smoking status: Former Smoker -- 0.3 packs/day for 6 years    Quit date: 06/11/2011  . Smokeless tobacco: Current User  . Alcohol Use: No  . Drug Use: No  . Sexually Active: Not on file   Other Topics Concern  . Not on file   Social History Narrative  . No narrative on file     No Known Allergies   Outpatient Prescriptions Prior to Visit  Medication Sig Dispense Refill  . albuterol (VENTOLIN HFA) 108 (90 BASE) MCG/ACT inhaler Inhale 2 puffs into the lungs every 6 (six) hours as needed.        . hydrOXYzine (ATARAX) 25 MG tablet Take 25 mg by mouth at bedtime.        . traMADol (ULTRAM) 50 MG tablet Take 50 mg by mouth 2 (two) times daily.        . Calcium Carbonate-Vitamin D (OYSTER SHELL CALCIUM 500 + D) 500-125 MG-UNIT TABS Take 1 tablet by mouth daily.        . Glycerin, Laxative, (FLEET LIQUID GLYCERIN SUPP) 5.6 GM/DOSE ENEM as directed.        . hydrochlorothiazide 25 MG tablet Take 12.5 mg  by mouth daily as needed.        . pantoprazole (PROTONIX) 40 MG tablet Take 40 mg by mouth daily.        . polyethylene glycol powder (GLYCOLAX/MIRALAX) powder Take 17 g by mouth daily.        . predniSONE (DELTASONE) 10 MG tablet Take 20 mg by mouth daily.            Review of Systems  Constitutional: Positive for appetite change and unexpected weight change.  HENT: Positive for dental problem.   Respiratory: Positive for cough and shortness of breath.   Cardiovascular: Positive for chest pain.  Gastrointestinal: Positive for abdominal pain.       Heartburn   Musculoskeletal: Positive for arthralgias.  Skin: Positive for rash.  Neurological: Positive for headaches.  Psychiatric/Behavioral: The patient is nervous/anxious.        Objective:   Physical Exam BP 126/80  Pulse 60  Temp(Src) 98.5 F (36.9 C) (Oral)  Ht 5\' 9"  (1.753 m)  Wt 198 lb 12.8 oz (90.175 kg)  BMI  29.36 kg/m2  SpO2 99%  General - healthy HEENT - PERRLA, EOMI, clear nasal discharge, no sinus tenderness, no oral exudate, no LAN, no thyromegaly Cardiac - s1s2 regular, no murmur, pulses symmetric Chest - no wheeze/rales/dullness Abd - soft, non-tender, no organomegaly Ext - no e/c/c Neuro - normal strength, CN intact Psych - normal mood, behavior Skin - no rashes    CT chest 12/30/04>>b/l hilar and mediastinal adenopathy with b/l nodular interstitial opacities CT chest 07/08/08>>decreased lymph nodes, scarring with volume loss PFT 07/04/08>>FEV1 2.55(83%), FEV1% 84, TLC 4.43(83%), DLCO 54%, no BD  Assessment & Plan:   Dyspnea She has a prior history of sarcoidosis.  She has symptoms of allergies, and her current symptoms are suggestive of asthma.    To further assess will arrange for chest xray, lab tests, and pulmonary function testing.  She can continue with prn ventolin for now.    Updated Medication List Outpatient Encounter Prescriptions as of 07/22/2011  Medication Sig Dispense Refill  . albuterol (VENTOLIN HFA) 108 (90 BASE) MCG/ACT inhaler Inhale 2 puffs into the lungs every 6 (six) hours as needed.        Marland Kitchen amoxicillin-clavulanate (AUGMENTIN) 875-125 MG per tablet Take 1 tablet by mouth 2 (two) times daily.        . hydrOXYzine (ATARAX) 25 MG tablet Take 25 mg by mouth at bedtime.        . traMADol (ULTRAM) 50 MG tablet Take 50 mg by mouth 2 (two) times daily.        Marland Kitchen triamcinolone (KENALOG) 0.025 % ointment Apply 1 application topically 2 (two) times daily. Or as directed       . DISCONTD: Calcium Carbonate-Vitamin D (OYSTER SHELL CALCIUM 500 + D) 500-125 MG-UNIT TABS Take 1 tablet by mouth daily.        Marland Kitchen DISCONTD: Glycerin, Laxative, (FLEET LIQUID GLYCERIN SUPP) 5.6 GM/DOSE ENEM as directed.        Marland Kitchen DISCONTD: hydrochlorothiazide 25 MG tablet Take 12.5 mg by mouth daily as needed.        Marland Kitchen DISCONTD: pantoprazole (PROTONIX) 40 MG tablet Take 40 mg by mouth daily.         Marland Kitchen DISCONTD: polyethylene glycol powder (GLYCOLAX/MIRALAX) powder Take 17 g by mouth daily.        Marland Kitchen DISCONTD: predniSONE (DELTASONE) 10 MG tablet Take 20 mg by mouth daily.

## 2011-07-22 NOTE — Assessment & Plan Note (Signed)
She has a prior history of sarcoidosis.  She has symptoms of allergies, and her current symptoms are suggestive of asthma.    To further assess will arrange for chest xray, lab tests, and pulmonary function testing.  She can continue with prn ventolin for now.

## 2011-07-23 LAB — ALLERGY FULL PROFILE
Bahia Grass: 8.71 kU/L — ABNORMAL HIGH (ref <0.35)
Bermuda Grass: 5.21 kU/L — ABNORMAL HIGH (ref <0.35)
Box Elder IgE: 9.15 kU/L — ABNORMAL HIGH (ref <0.35)
Candida Albicans: 0.18 kU/L (ref <0.35)
Curvularia lunata: <0.1 kU/L (ref <0.35)
Dog Dander: 0.28 kU/L (ref <0.35)
Fescue: 15.2 kU/L — ABNORMAL HIGH (ref <0.35)
G005 Rye, Perennial: 14.6 kU/L — ABNORMAL HIGH (ref <0.35)
Goldenrod: 2.58 kU/L — ABNORMAL HIGH (ref <0.35)
Helminthosporium halodes: <0.1 kU/L (ref <0.35)
IgE (Immunoglobulin E), Serum: 78.6 IU/mL (ref 0.0–180.0)
Sycamore Tree: 3.66 kU/L — ABNORMAL HIGH (ref <0.35)

## 2011-07-27 ENCOUNTER — Encounter: Payer: Self-pay | Admitting: Pulmonary Disease

## 2011-08-03 ENCOUNTER — Encounter: Payer: Self-pay | Admitting: Pulmonary Disease

## 2011-08-05 ENCOUNTER — Encounter: Payer: Self-pay | Admitting: Pulmonary Disease

## 2011-08-05 ENCOUNTER — Ambulatory Visit (INDEPENDENT_AMBULATORY_CARE_PROVIDER_SITE_OTHER): Payer: Medicare Other | Admitting: Pulmonary Disease

## 2011-08-05 VITALS — BP 114/76 | HR 84 | Temp 98.2°F | Ht 69.0 in | Wt 202.8 lb

## 2011-08-05 DIAGNOSIS — R0600 Dyspnea, unspecified: Secondary | ICD-10-CM

## 2011-08-05 DIAGNOSIS — D869 Sarcoidosis, unspecified: Secondary | ICD-10-CM

## 2011-08-05 DIAGNOSIS — R0989 Other specified symptoms and signs involving the circulatory and respiratory systems: Secondary | ICD-10-CM

## 2011-08-05 MED ORDER — FLUTICASONE-SALMETEROL 500-50 MCG/DOSE IN AEPB: 1.0000 | INHALATION_SPRAY | Freq: Two times a day (BID) | RESPIRATORY_TRACT | Status: DC

## 2011-08-05 MED ORDER — ZAFIRLUKAST 20 MG PO TABS: 20.0000 mg | ORAL_TABLET | Freq: Two times a day (BID) | ORAL | Status: DC

## 2011-08-05 MED ORDER — LORATADINE 10 MG PO TABS: 10.0000 mg | ORAL_TABLET | Freq: Every day | ORAL | Status: DC

## 2011-08-05 NOTE — Assessment & Plan Note (Addendum)
She has symptoms consistent with allergies and asthma.  Her recent RAST was positive and IgE was 71.8.  Will start her on advair, accolate, and claritin.  She can continue ventolin as needed.

## 2011-08-05 NOTE — Patient Instructions (Signed)
Advair one puff twice per day, and rinse mouth after using Accolate 20 mg twice per day Claritin 10 mg daily Will reschedule breathing test (PFT) Follow up in 4 weeks

## 2011-08-05 NOTE — Assessment & Plan Note (Signed)
Much of her current symptoms seem to be related to allergies and asthma.  She did not have significant change on recent chest xray.  Will treat her asthma first, and then re-assess whether she needs additional intervention for sarcoidosis.

## 2011-08-05 NOTE — Progress Notes (Signed)
  Subjective:    Patient ID: Rhonda Hester, female    DOB: 04-12-1976, 35 y.o.   MRN: [id number]  HPI CC: [facility]  35 yo female with history of sarcoidosis for evaluation of cough and dyspnea.  She continues to have cough and wheeze when exposed to dust or animals.  She is off prednisone.  She has been using her ventolin on a regular basis.   Review of Systems     Objective:   Physical Exam  BP 114/76  Pulse 84  Temp(Src) 98.2 F (36.8 C) (Oral)  Ht 5\' 9"  (1.753 m)  Wt 202 lb 12.8 oz (91.989 kg)  BMI 29.95 kg/m2  SpO2 98%  General - healthy  HEENT - PERRLA, EOMI, clear nasal discharge, no sinus tenderness, no oral exudate, no LAN, no thyromegaly  Cardiac - s1s2 regular, no murmur, pulses symmetric  Chest - b/l wheeze, no rales/dullness  Abd - soft, non-tender, no organomegaly  Ext - no e/c/c  Neuro - normal strength, CN intact  Psych - normal mood, behavior  Skin - no rashes  CHEST - 2 VIEW 07/22/11:  Comparison: Chest x-ray of 01/15/2011  Findings: Linear opacities in the left mid lung and right mid upper  lung field remain most consistent with areas of scarring. No  active infiltrate or effusion is seen. Mediastinal contours are  stable. The heart is within normal limits in size. No bony  abnormality is seen.   IMPRESSION:  Areas of scarring appear stable bilaterally. No active process is  seen.     Assessment & Plan:

## 2011-08-06 ENCOUNTER — Encounter: Payer: Self-pay | Admitting: Pulmonary Disease

## 2011-09-02 ENCOUNTER — Encounter: Payer: Self-pay | Admitting: Pulmonary Disease

## 2011-09-02 ENCOUNTER — Ambulatory Visit (INDEPENDENT_AMBULATORY_CARE_PROVIDER_SITE_OTHER): Payer: Medicare Other | Admitting: Pulmonary Disease

## 2011-09-02 VITALS — BP 122/78 | HR 90 | Temp 98.3°F | Ht 65.0 in | Wt 200.0 lb

## 2011-09-02 DIAGNOSIS — D869 Sarcoidosis, unspecified: Secondary | ICD-10-CM

## 2011-09-02 DIAGNOSIS — Z23 Encounter for immunization: Secondary | ICD-10-CM

## 2011-09-02 DIAGNOSIS — J45909 Unspecified asthma, uncomplicated: Secondary | ICD-10-CM

## 2011-09-02 LAB — PULMONARY FUNCTION TEST

## 2011-09-02 MED ORDER — FLUTICASONE-SALMETEROL 250-50 MCG/DOSE IN AEPB: 1.0000 | INHALATION_SPRAY | Freq: Two times a day (BID) | RESPIRATORY_TRACT | Status: DC

## 2011-09-02 NOTE — Progress Notes (Signed)
  Subjective:    Patient ID: Rhonda Hester, female    DOB: [birth year], 35 y.o.   MRN: [id number]  HPI 35 yo female former smoker with Asthma, Allergic rhinitis and Sarcoidosis.  Her breathing has been doing better.  She has not needed to use ventolin since she has been using advair and accolate.  She denies chest pain, fever, sputum, wheeze, or hemoptysis.  Past Medical History  Diagnosis Date  . Sarcoidosis 2006  . Seasonal allergic rhinitis   . Asthma   . Eczema   . Bell's palsy 2006    Family History  Problem Relation Age of Onset  . Brain cancer Maternal Grandmother     History   Social History  . Marital Status: Single   Social History Main Topics  . Smoking status: Former Smoker -- 0.3 packs/day for 6 years    Quit date: 06/11/2011  . Smokeless tobacco: Current User  . Alcohol Use: No  . Drug Use: No   No Known Allergies   Review of Systems     Objective:   Physical Exam  BP 122/78  Pulse 90  Temp(Src) 98.3 F (36.8 C) (Oral)  Ht 5\' 5"  (1.651 m)  Wt 200 lb (90.719 kg)  BMI 33.28 kg/m2  SpO2 97%  General - healthy  HEENT - PERRLA, EOMI, clear nasal discharge, no sinus tenderness, no oral exudate, no LAN, no thyromegaly  Cardiac - s1s2 regular, no murmur, pulses symmetric  Chest - b/l wheeze, no rales/dullness  Abd - soft, non-tender, no organomegaly  Ext - no e/c/c  Neuro - normal strength, CN intact  Psych - normal mood, behavior  Skin - no rashes  PFT 09/02/11>>FEV1 2.80(93%), FEV1% 82, TLC 4.74(89%), DLCO 59%, DLCO/VA 95%, no BD response.     Assessment & Plan:   Allergic asthma She has improved since being on advair and accolate.  Will see if she can tolerate reduction in dose of advair to 250/50 one puff bid.  Explained importance of rinsing her mouth after using advair.  She is to call if her symptoms get worse.  Sarcoidosis Her recent chest xray was stable, and PFT's did not show significant change from July 2009.  Will continue  clinical monitoring.    Updated Medication List Outpatient Encounter Prescriptions as of 09/02/2011  Medication Sig Dispense Refill  . albuterol (VENTOLIN HFA) 108 (90 BASE) MCG/ACT inhaler Inhale 2 puffs into the lungs every 6 (six) hours as needed.        . loratadine (CLARITIN) 10 MG tablet Take 1 tablet (10 mg total) by mouth daily.  30 tablet  2  . traMADol (ULTRAM) 50 MG tablet Take 50 mg by mouth as needed.       . triamcinolone (KENALOG) 0.025 % ointment Apply 1 application topically 2 (two) times daily. Or as directed       . zafirlukast (ACCOLATE) 20 MG tablet Take 1 tablet (20 mg total) by mouth 2 (two) times daily.  60 tablet  5  . DISCONTD: Fluticasone-Salmeterol (ADVAIR DISKUS) 500-50 MCG/DOSE AEPB Inhale 1 puff into the lungs 2 (two) times daily.  60 each  6  . Fluticasone-Salmeterol (ADVAIR DISKUS) 250-50 MCG/DOSE AEPB Inhale 1 puff into the lungs 2 (two) times daily.  1 each  11

## 2011-09-02 NOTE — Assessment & Plan Note (Signed)
She has improved since being on advair and accolate.  Will see if she can tolerate reduction in dose of advair to 250/50 one puff bid.  Explained importance of rinsing her mouth after using advair.  She is to call if her symptoms get worse.

## 2011-09-02 NOTE — Progress Notes (Signed)
PFT done today. 

## 2011-09-02 NOTE — Assessment & Plan Note (Signed)
Her recent chest xray was stable, and PFT's did not show significant change from July 2009.  Will continue clinical monitoring.

## 2011-09-02 NOTE — Patient Instructions (Signed)
Change advair to 250/50 one puff twice per day>>rinse mouth after each use Zafirlukast 20 mg twice per day Ventolin two puffs as needed for cough, wheeze, or chest congestion Follow up in 4 months Flu shot today

## 2011-09-17 ENCOUNTER — Encounter: Payer: Self-pay | Admitting: Pulmonary Disease

## 2011-10-05 LAB — DIFFERENTIAL
Basophils Absolute: 0
Basophils Relative: 0
Eosinophils Absolute: 0 — ABNORMAL LOW
Monocytes Absolute: 0.5
Neutro Abs: 9.1 — ABNORMAL HIGH

## 2011-10-05 LAB — CBC
Hemoglobin: 11.6 — ABNORMAL LOW
MCHC: 33.8
RDW: 14.2

## 2011-10-05 LAB — URINALYSIS, ROUTINE W REFLEX MICROSCOPIC
Bilirubin Urine: NEGATIVE
Glucose, UA: NEGATIVE
Hgb urine dipstick: NEGATIVE
Ketones, ur: NEGATIVE
Protein, ur: NEGATIVE
pH: 6

## 2011-10-05 LAB — BASIC METABOLIC PANEL
CO2: 29
Calcium: 9.4
Creatinine, Ser: 0.67
Glucose, Bld: 103 — ABNORMAL HIGH
Sodium: 138

## 2011-10-05 LAB — URINE CULTURE: Colony Count: 25000

## 2011-10-20 ENCOUNTER — Inpatient Hospital Stay (HOSPITAL_COMMUNITY)
Admission: AD | Admit: 2011-10-20 | Discharge: 2011-10-20 | Disposition: A | Discharge status: Home / Self Care | Payer: Medicare Other | Source: Ambulatory Visit | Attending: Obstetrics and Gynecology | Admitting: Obstetrics and Gynecology

## 2011-10-20 ENCOUNTER — Encounter (HOSPITAL_COMMUNITY): Payer: Self-pay | Admitting: *Deleted

## 2011-10-20 ENCOUNTER — Other Ambulatory Visit: Payer: Self-pay

## 2011-10-20 DIAGNOSIS — F410 Panic disorder [episodic paroxysmal anxiety] without agoraphobia: Secondary | ICD-10-CM | POA: Insufficient documentation

## 2011-10-20 MED ORDER — GI COCKTAIL ~~LOC~~
30.0000 mL | Freq: Once | ORAL | Status: AC
  Administered 2011-10-20: 30 mL via ORAL
  Filled 2011-10-20: qty 30

## 2011-10-20 NOTE — ED Provider Notes (Signed)
History     No chief complaint on file.  HPI Rhonda Hester 35 y.o. comes to [facility] with chest pain.  Had experienced "fluttering" in her chest when walking to the mailbox.  Came home to lie down and was feeling worse.  Had a sharp stabbing pain under left breast.  Currently stabbing pain has stopped.  Client not having pain.  Feels worse when lying down or when walking.  Client very worried. Tearful.  Had looked on line and saw that chest pain could be a heart attack.  She did not want to lie down and go to sleep and not wake up.  Came for evaluation.  Client of [facility].   OB History    Grav Para Term Preterm Abortions TAB SAB Ect Mult Living   1 0 0 0 1 0 1 0 0 0       Past Medical History  Diagnosis Date  . Sarcoidosis 2006  . Seasonal allergic rhinitis   . Asthma   . Eczema   . Bell's palsy 2006    Past Surgical History  Procedure Date  . Dilation and curettage of uterus 1997  . Cystoscopy 2011    Family History  Problem Relation Age of Onset  . Brain cancer Maternal Grandmother     History  Substance Use Topics  . Smoking status: Former Smoker -- 0.3 packs/day for 6 years    Quit date: 09/20/2011  . Smokeless tobacco: Current User  . Alcohol Use: No    Allergies: No Known Allergies  Prescriptions prior to admission  Medication Sig Dispense Refill  . albuterol (VENTOLIN HFA) 108 (90 BASE) MCG/ACT inhaler Inhale 2 puffs into the lungs every 6 (six) hours as needed.        . Fluticasone-Salmeterol (ADVAIR DISKUS) 250-50 MCG/DOSE AEPB Inhale 1 puff into the lungs 2 (two) times daily.  1 each  11  . loratadine (CLARITIN) 10 MG tablet Take 1 tablet (10 mg total) by mouth daily.  30 tablet  2  . traMADol (ULTRAM) 50 MG tablet Take 50 mg by mouth as needed.       . triamcinolone (KENALOG) 0.025 % ointment Apply 1 application topically 2 (two) times daily. Or as directed       . zafirlukast (ACCOLATE) 20 MG tablet Take 1 tablet (20 mg total) by mouth 2 (two) times  daily.  60 tablet  5    Review of Systems  Cardiovascular:       Fluttering in chest Periodic stabbing pain near left breast.   Physical Exam   Blood pressure 139/81, pulse 69, temperature 98.3 F (36.8 C), temperature source Oral, resp. rate 20, SpO2 99.00%.  Physical Exam  Nursing note and vitals reviewed. Constitutional: She is oriented to person, place, and time. She appears well-developed and well-nourished.  HENT:  Head: Normocephalic.  Eyes: EOM are normal.  Neck: Neck supple.  Cardiovascular: Normal rate and regular rhythm.   Respiratory: Effort normal and breath sounds normal.  GI: Soft.  Musculoskeletal: Normal range of motion.  Neurological: She is alert and oriented to person, place, and time.  Skin: Skin is warm and dry.  Psychiatric: She has a normal mood and affect.    MAU Course  Procedures EKG was normal  MDM Vital signs stable, EKG normal, Client has calmed down and respiratory rate is now 18.  Discussed with her and her mother the possibility of a panic attack.  They are in agreement.  Will send home. GI cocktail  given and client tolerated well but unsure if this has actually helped client.  Assessment and Plan  Panic or anxiety attack  Plan: Make an appointment at [facility] for follow up.   BURLESON,TERRI 10/20/2011, 6:54 PM   Nolene Bernheim, NP 10/20/11 1914  Nolene Bernheim, NP 10/20/11 1923

## 2011-10-20 NOTE — Progress Notes (Signed)
Pt states for the last month or 2 she feels like she is going to pass out, when she is talking. Pt states she has a lack of energy

## 2011-10-26 NOTE — ED Provider Notes (Signed)
Reviewed and agree.

## 2012-01-02 ENCOUNTER — Encounter (HOSPITAL_COMMUNITY): Payer: Self-pay | Admitting: *Deleted

## 2012-01-02 ENCOUNTER — Inpatient Hospital Stay (HOSPITAL_COMMUNITY)
Admission: AD | Admit: 2012-01-02 | Discharge: 2012-01-02 | Disposition: A | Discharge status: Home / Self Care | Payer: Medicare Other | Source: Ambulatory Visit | Attending: Obstetrics & Gynecology | Admitting: Obstetrics & Gynecology

## 2012-01-02 DIAGNOSIS — N390 Urinary tract infection, site not specified: Secondary | ICD-10-CM | POA: Insufficient documentation

## 2012-01-02 DIAGNOSIS — R1031 Right lower quadrant pain: Secondary | ICD-10-CM | POA: Insufficient documentation

## 2012-01-02 LAB — CBC
Hemoglobin: 13.1 g/dL (ref 12.0–15.0)
Platelets: 259 10*3/uL (ref 150–400)
RBC: 4.74 MIL/uL (ref 3.87–5.11)
WBC: 6 10*3/uL (ref 4.0–10.5)

## 2012-01-02 LAB — URINALYSIS, ROUTINE W REFLEX MICROSCOPIC
Bilirubin Urine: NEGATIVE
Ketones, ur: 15 mg/dL — AB
Nitrite: POSITIVE — AB
Protein, ur: >300 mg/dL — AB
Specific Gravity, Urine: >1.03 — ABNORMAL HIGH (ref 1.005–1.030)
Urobilinogen, UA: 2 mg/dL — ABNORMAL HIGH (ref 0.0–1.0)

## 2012-01-02 LAB — WET PREP, GENITAL: Clue Cells Wet Prep HPF POC: NONE SEEN

## 2012-01-02 LAB — URINE MICROSCOPIC-ADD ON

## 2012-01-02 MED ORDER — KETOROLAC TROMETHAMINE 60 MG/2ML IM SOLN
60.0000 mg | Freq: Once | INTRAMUSCULAR | Status: AC
  Administered 2012-01-02: 60 mg via INTRAMUSCULAR
  Filled 2012-01-02: qty 2

## 2012-01-02 MED ORDER — NITROFURANTOIN MONOHYD MACRO 100 MG PO CAPS: 100.0000 mg | ORAL_CAPSULE | Freq: Two times a day (BID) | ORAL | Status: AC

## 2012-01-02 NOTE — Progress Notes (Signed)
Pt reports feeling constipated x 1 month have some mucusy stool and discharge. Alternates between soft and hard.

## 2012-01-02 NOTE — Progress Notes (Addendum)
Pt reports having lower right abd pain and vomiting that started suddenly this morning. Pt reports vaginal bleeding and irritation  as well. Pain made her unable to get off the floor.

## 2012-01-02 NOTE — Progress Notes (Signed)
Pt reports having a large amount of "bleeding but she was not sure where the blood was coming from. No bleeding observed.

## 2012-01-02 NOTE — ED Provider Notes (Signed)
History     Chief Complaint  Patient presents with  . Abdominal Pain   HPI Rhonda Hester 36 y.o. MAU pregnancy test is negative.  Client had pain in RLQ today when she went to the bathroom.  Urine very dark.  Came to MAU for evaluation.  Teary at times with pain.  Client of Health serve.  Has been seen in MAU previously.   OB History    Grav Para Term Preterm Abortions TAB SAB Ect Mult Living   1 0 0 0 1 0 1 0 0 0       Past Medical History  Diagnosis Date  . Sarcoidosis 2006  . Seasonal allergic rhinitis   . Asthma   . Eczema   . Bell's palsy 2006    Past Surgical History  Procedure Date  . Dilation and curettage of uterus 1997  . Cystoscopy 2011    Family History  Problem Relation Age of Onset  . Brain cancer Maternal Grandmother     History  Substance Use Topics  . Smoking status: Current Some Day Smoker -- 0.3 packs/day for 6 years    Last Attempt to Quit: 09/20/2011  . Smokeless tobacco: Never Used  . Alcohol Use: No    Allergies: No Known Allergies  Prescriptions prior to admission  Medication Sig Dispense Refill  . albuterol (VENTOLIN HFA) 108 (90 BASE) MCG/ACT inhaler Inhale 2 puffs into the lungs every 6 (six) hours as needed. For shortness of breath      . ibuprofen (ADVIL,MOTRIN) 200 MG tablet Take 200 mg by mouth daily as needed. For pain       . loratadine (CLARITIN) 10 MG tablet Take 1 tablet (10 mg total) by mouth daily.  30 tablet  2  . traMADol (ULTRAM) 50 MG tablet Take 50 mg by mouth 2 (two) times daily as needed. For pain      . triamcinolone (KENALOG) 0.025 % ointment Apply 1 application topically 2 (two) times daily. Or as directed       . Fluticasone-Salmeterol (ADVAIR DISKUS) 250-50 MCG/DOSE AEPB Inhale 1 puff into the lungs 2 (two) times daily.  1 each  11    Review of Systems  Gastrointestinal: Positive for abdominal pain.  Genitourinary: Positive for dysuria.   Physical Exam   Blood pressure 124/70, pulse 80, temperature 98  F (36.7 C), temperature source Oral, resp. rate 18, last menstrual period 12/12/2011.  Physical Exam  Nursing note and vitals reviewed. Constitutional: She is oriented to person, place, and time. She appears well-developed and well-nourished.  HENT:  Head: Normocephalic.  Eyes: EOM are normal.  Neck: Neck supple.  GI: Soft. There is no tenderness.  Genitourinary:       Speculum exam: Vagina - Small amount of creamy discharge, no odor, no blood seen Cervix - No contact bleeding Bimanual exam: Cervix closed Uterus non tender, normal size Adnexa non tender, no masses bilaterally GC/Chlam, wet prep done Chaperone present for exam.  Musculoskeletal: Normal range of motion.  Neurological: She is alert and oriented to person, place, and time.  Skin: Skin is warm and dry.  Psychiatric: She has a normal mood and affect.    MAU Course  Procedures  MDM Results for orders placed during the hospital encounter of 01/02/12 (from the past 24 hour(s))  URINALYSIS, ROUTINE W REFLEX MICROSCOPIC     Status: Abnormal   Collection Time   01/02/12  1:15 PM      Component Value Range   Color,  Urine BROWN (*) YELLOW    APPearance TURBID (*) CLEAR    Specific Gravity, Urine >1.030 (*) 1.005 - 1.030    pH 7.0  5.0 - 8.0    Glucose, UA 250 (*) NEGATIVE (mg/dL)   Hgb urine dipstick LARGE (*) NEGATIVE    Bilirubin Urine NEGATIVE  NEGATIVE    Ketones, ur 15 (*) NEGATIVE (mg/dL)   Protein, ur >161 (*) NEGATIVE (mg/dL)   Urobilinogen, UA 2.0 (*) 0.0 - 1.0 (mg/dL)   Nitrite POSITIVE (*) NEGATIVE    Leukocytes, UA SMALL (*) NEGATIVE   URINE MICROSCOPIC-ADD ON     Status: Abnormal   Collection Time   01/02/12  1:15 PM      Component Value Range   Squamous Epithelial / LPF FEW (*) RARE    WBC, UA 3-6  <3 (WBC/hpf)   RBC / HPF TOO NUMEROUS TO COUNT  <3 (RBC/hpf)   Bacteria, UA MANY (*) RARE   POCT PREGNANCY, URINE     Status: Normal   Collection Time   01/02/12  1:25 PM      Component Value Range    Preg Test, Ur NEGATIVE    CBC     Status: Normal   Collection Time   01/02/12  1:40 PM      Component Value Range   WBC 6.0  4.0 - 10.5 (K/uL)   RBC 4.74  3.87 - 5.11 (MIL/uL)   Hemoglobin 13.1  12.0 - 15.0 (g/dL)   HCT 09.6  04.5 - 40.9 (%)   MCV 81.6  78.0 - 100.0 (fL)   MCH 27.6  26.0 - 34.0 (pg)   MCHC 33.9  30.0 - 36.0 (g/dL)   RDW 81.1  91.4 - 78.2 (%)   Platelets 259  150 - 400 (K/uL)  WET PREP, GENITAL     Status: Abnormal   Collection Time   01/02/12  2:20 PM      Component Value Range   Yeast, Wet Prep NONE SEEN  NONE SEEN    Trich, Wet Prep NONE SEEN  NONE SEEN    Clue Cells, Wet Prep NONE SEEN  NONE SEEN    WBC, Wet Prep HPF POC FEW (*) NONE SEEN    Toradol 60 mg IM for pain.  Assessment and Plan  UTI  Plan Will give macrobid bid x 5 days Drink at least 8 8-oz glasses of water every day. Get your prescription filled today and take twice a day See your doctor if you are feeling worse - fever, back ache or body aches.  BURLESON,TERRI 01/02/2012, 1:36 PM   Nolene Bernheim, NP 01/02/12 1454

## 2012-01-03 LAB — GC/CHLAMYDIA PROBE AMP, GENITAL: GC Probe Amp, Genital: NEGATIVE

## 2012-05-17 ENCOUNTER — Emergency Department (HOSPITAL_COMMUNITY)
Admission: EM | Admit: 2012-05-17 | Discharge: 2012-05-18 | Disposition: A | Discharge status: Home / Self Care | Payer: Medicare Other | Attending: Emergency Medicine | Admitting: Emergency Medicine

## 2012-05-17 ENCOUNTER — Encounter (HOSPITAL_COMMUNITY): Payer: Self-pay | Admitting: *Deleted

## 2012-05-17 DIAGNOSIS — D869 Sarcoidosis, unspecified: Secondary | ICD-10-CM | POA: Insufficient documentation

## 2012-05-17 DIAGNOSIS — N3090 Cystitis, unspecified without hematuria: Secondary | ICD-10-CM | POA: Insufficient documentation

## 2012-05-17 DIAGNOSIS — R112 Nausea with vomiting, unspecified: Secondary | ICD-10-CM | POA: Insufficient documentation

## 2012-05-17 DIAGNOSIS — M79609 Pain in unspecified limb: Secondary | ICD-10-CM | POA: Insufficient documentation

## 2012-05-17 DIAGNOSIS — L259 Unspecified contact dermatitis, unspecified cause: Secondary | ICD-10-CM | POA: Insufficient documentation

## 2012-05-17 DIAGNOSIS — R300 Dysuria: Secondary | ICD-10-CM | POA: Insufficient documentation

## 2012-05-17 DIAGNOSIS — R450 Nervousness: Secondary | ICD-10-CM | POA: Insufficient documentation

## 2012-05-17 DIAGNOSIS — J45909 Unspecified asthma, uncomplicated: Secondary | ICD-10-CM | POA: Insufficient documentation

## 2012-05-17 DIAGNOSIS — N898 Other specified noninflammatory disorders of vagina: Secondary | ICD-10-CM | POA: Insufficient documentation

## 2012-05-17 DIAGNOSIS — G8929 Other chronic pain: Secondary | ICD-10-CM | POA: Insufficient documentation

## 2012-05-17 DIAGNOSIS — F17200 Nicotine dependence, unspecified, uncomplicated: Secondary | ICD-10-CM | POA: Insufficient documentation

## 2012-05-17 DIAGNOSIS — R109 Unspecified abdominal pain: Secondary | ICD-10-CM | POA: Insufficient documentation

## 2012-05-17 DIAGNOSIS — R197 Diarrhea, unspecified: Secondary | ICD-10-CM | POA: Insufficient documentation

## 2012-05-17 DIAGNOSIS — M549 Dorsalgia, unspecified: Secondary | ICD-10-CM | POA: Insufficient documentation

## 2012-05-17 HISTORY — DX: Interstitial cystitis (chronic) without hematuria: N30.10

## 2012-05-17 LAB — URINALYSIS, ROUTINE W REFLEX MICROSCOPIC
Ketones, ur: NEGATIVE mg/dL
Leukocytes, UA: NEGATIVE
Nitrite: NEGATIVE
Specific Gravity, Urine: 1.04 — ABNORMAL HIGH (ref 1.005–1.030)
Urobilinogen, UA: 1 mg/dL (ref 0.0–1.0)
pH: 5.5 (ref 5.0–8.0)

## 2012-05-17 LAB — DIFFERENTIAL
Basophils Absolute: 0 10*3/uL (ref 0.0–0.1)
Basophils Relative: 1 % (ref 0–1)
Eosinophils Relative: 6 % — ABNORMAL HIGH (ref 0–5)
Lymphocytes Relative: 23 % (ref 12–46)
Neutro Abs: 3.1 10*3/uL (ref 1.7–7.7)

## 2012-05-17 LAB — CBC
MCHC: 33.5 g/dL (ref 30.0–36.0)
Platelets: 234 10*3/uL (ref 150–400)
RDW: 13.2 % (ref 11.5–15.5)
WBC: 5.3 10*3/uL (ref 4.0–10.5)

## 2012-05-17 MED ORDER — ONDANSETRON HCL 4 MG/2ML IJ SOLN
4.0000 mg | Freq: Once | INTRAMUSCULAR | Status: AC
  Administered 2012-05-17: 4 mg via INTRAVENOUS
  Filled 2012-05-17: qty 2

## 2012-05-17 MED ORDER — HYDROMORPHONE HCL PF 1 MG/ML IJ SOLN
1.0000 mg | Freq: Once | INTRAMUSCULAR | Status: AC
  Administered 2012-05-17: 1 mg via INTRAVENOUS
  Filled 2012-05-17: qty 1

## 2012-05-17 MED ORDER — ONDANSETRON HCL 4 MG/2ML IJ SOLN
4.0000 mg | Freq: Once | INTRAMUSCULAR | Status: AC
  Administered 2012-05-18: 4 mg via INTRAVENOUS

## 2012-05-17 MED ORDER — LORAZEPAM 1 MG PO TABS
1.0000 mg | ORAL_TABLET | Freq: Once | ORAL | Status: AC
  Administered 2012-05-18: 1 mg via ORAL
  Filled 2012-05-17: qty 1

## 2012-05-17 NOTE — ED Provider Notes (Signed)
History     CSN: [id number]  Arrival date & time 05/17/12  2128   First MD Initiated Contact with Patient 05/17/12 2220      Chief Complaint  Patient presents with  . Abdominal Pain  . Leg Pain    (Consider location/radiation/quality/duration/timing/severity/associated sxs/prior treatment) The history is provided by the patient. No language interpreter was used.  cc: 36 year old female coming in today with her usual cystitis chronic pain with dysuria. Patient is to [facility] for this problem as it is planned a procedure for June 14. Pain is suprapubic like prior episodes.  She sees nurse practitioner Myrtie Soman is there. Patient is also having lower back pain radiating to right thigh.   she has also had 2 episodes of nausea and vomiting. States that the pain started around 4:00 today. She has taking nothing for the pain. She also states she is on her period x2 days. Denies vaginal discharge fever. Past medical history of sarcoidosis asthma eczema Bell's palsy and interstitial cystitis. She is a smoker. Patient is tearful and anxious.   Past Medical History  Diagnosis Date  . Sarcoidosis 2006  . Seasonal allergic rhinitis   . Asthma   . Eczema   . Bell's palsy 2006  . Interstitial cystitis     Past Surgical History  Procedure Date  . Dilation and curettage of uterus 1997  . Cystoscopy 2011    Family History  Problem Relation Age of Onset  . Brain cancer Maternal Grandmother     History  Substance Use Topics  . Smoking status: Current Some Day Smoker -- 0.3 packs/day for 6 years    Last Attempt to Quit: 09/20/2011  . Smokeless tobacco: Never Used  . Alcohol Use: No    OB History    Grav Para Term Preterm Abortions TAB SAB Ect Mult Living   1 0 0 0 1 0 1 0 0 0       Review of Systems  Constitutional: Negative.  Negative for fever.  HENT: Negative.   Eyes: Negative.   Respiratory: Negative.   Cardiovascular: Negative.   Gastrointestinal: Positive for nausea,  vomiting, abdominal pain and diarrhea.  Genitourinary: Positive for dysuria and vaginal bleeding. Negative for urgency, hematuria, flank pain, vaginal discharge and pelvic pain.  Musculoskeletal: Positive for back pain. Negative for gait problem.  Skin: Negative.   Neurological: Negative.  Negative for dizziness, tremors, syncope, speech difficulty, weakness and headaches.  Psychiatric/Behavioral: The patient is nervous/anxious.   All other systems reviewed and are negative.    Allergies  Review of patient's allergies indicates no known allergies.  Home Medications   Current Outpatient Rx  Name Route Sig Dispense Refill  . ALBUTEROL SULFATE HFA 108 (90 BASE) MCG/ACT IN AERS Inhalation Inhale 2 puffs into the lungs every 6 (six) hours as needed. For shortness of breath    . FLUTICASONE-SALMETEROL 250-50 MCG/DOSE IN AEPB Inhalation Inhale 1 puff into the lungs 2 (two) times daily. 1 each 11  . PENTOSAN POLYSULFATE SODIUM 100 MG PO CAPS Oral Take 100 mg by mouth 2 (two) times daily.      BP 113/96  Pulse 64  Temp(Src) 97.9 F (36.6 C) (Oral)  Resp 14  SpO2 98%  LMP 05/15/2012  Physical Exam  Nursing note and vitals reviewed. Constitutional: She is oriented to person, place, and time. She appears well-developed and well-nourished.  HENT:  Head: Normocephalic and atraumatic.  Eyes: Conjunctivae and EOM are normal. Pupils are equal, round, and reactive to light.  Neck: Normal range of motion. Neck supple.  Cardiovascular: Normal rate.   Pulmonary/Chest: Effort normal and breath sounds normal. No respiratory distress. She has no wheezes.  Abdominal: Soft. Bowel sounds are normal. She exhibits no distension. There is no tenderness. There is no rebound and no guarding.       Suprapubic pain  Musculoskeletal: Normal range of motion. She exhibits no edema and no tenderness.  Neurological: She is alert and oriented to person, place, and time. She has normal reflexes.  Skin: Skin is  warm and dry.  Psychiatric: She has a normal mood and affect.    ED Course  Procedures (including critical care time)   Labs Reviewed  CBC  DIFFERENTIAL  URINALYSIS, ROUTINE W REFLEX MICROSCOPIC   No results found.   No diagnosis found.    MDM  Treated for pain with her usual cystitis episode.  Will follow up with urology tomorrow. Relief in the ER after toradol and dilaudid.  Ativan for anxiety.  Urine Culture pending. Feels better ready for discharge with mom.   Labs Reviewed  CBC - Abnormal; Notable for the following:    Hemoglobin 11.5 (*)    HCT 34.3 (*)    All other components within normal limits  DIFFERENTIAL - Abnormal; Notable for the following:    Eosinophils Relative 6 (*)    All other components within normal limits  URINALYSIS, ROUTINE W REFLEX MICROSCOPIC - Abnormal; Notable for the following:    Color, Urine AMBER (*) BIOCHEMICALS MAY BE AFFECTED BY COLOR   APPearance CLOUDY (*)    Specific Gravity, Urine 1.040 (*)    Bilirubin Urine SMALL (*)    All other components within normal limits  POCT PREGNANCY, URINE  URINE CULTURE         Remi Haggard, NP 05/18/12 1403

## 2012-05-17 NOTE — ED Notes (Addendum)
C/o bilateral lower abd/pelvic pain, also low back pain R>L and radiates down R leg, thinks it is related to "interstitial cystitis", onset this am around 1130. (denies: fever, nd, urinary or vaginal sx), LMP 2d ago. Vomited PTA. Pt restless.

## 2012-05-17 NOTE — ED Notes (Signed)
Pt reports bilat lower abd pain that began approx 1600 today, pt w/ recent dx of interstitial cystitis, states has been given medication for this however pain has not been relieved and gotten progressively worse. Pt currently on her menstrual cycle - states has not been having normal BMs, had an abnormal small one this a.m. However can not recall LNBM. Pt w/ some guarding and grimacing on assessment. Admits to x1 episode of diarrhea today, unsure of fever. Pt in no acute distress on assessment.

## 2012-05-18 LAB — URINE CULTURE
Colony Count: NO GROWTH
Culture  Setup Time: 201305230228
Culture: NO GROWTH

## 2012-05-18 MED ORDER — ONDANSETRON HCL 4 MG PO TABS: 4.0000 mg | ORAL_TABLET | Freq: Four times a day (QID) | ORAL | Status: AC

## 2012-05-18 MED ORDER — ONDANSETRON HCL 4 MG/2ML IJ SOLN
INTRAMUSCULAR | Status: AC
  Administered 2012-05-18: 4 mg via INTRAVENOUS
  Filled 2012-05-18: qty 2

## 2012-05-18 MED ORDER — KETOROLAC TROMETHAMINE 30 MG/ML IJ SOLN
30.0000 mg | Freq: Once | INTRAMUSCULAR | Status: AC
  Administered 2012-05-18: 30 mg via INTRAVENOUS
  Filled 2012-05-18: qty 1

## 2012-05-18 NOTE — ED Notes (Signed)
D/c instructions reviewed w/ pt and family - pt and family deny any further questions or concerns at present. Pt ambulating independently w/ steady gait on d/c - pt in no acute distress, A&Ox4 - pt's family at bedside to drive pt home.

## 2012-05-18 NOTE — Discharge Instructions (Signed)
Rhonda Hester you do not have a UTI today.  We gave dilaudid for pain, Zofran for nausea, and Toradol for pain while you're in the ER today. We also gave you Ativan for anxiety. Followup with your urologists. Make a list of questions you need answered so you can be clear on your treatment.  Return to the ER for severe pain or other concerns.

## 2012-05-19 NOTE — ED Provider Notes (Signed)
Medical screening examination/treatment/procedure(s) were performed by non-physician practitioner and as supervising physician I was immediately available for consultation/collaboration.   Asim Gersten, MD 05/19/12 0406 

## 2012-06-15 ENCOUNTER — Ambulatory Visit: Payer: Medicare Other | Admitting: Cardiology

## 2012-06-15 DIAGNOSIS — R0989 Other specified symptoms and signs involving the circulatory and respiratory systems: Secondary | ICD-10-CM

## 2012-06-15 NOTE — Progress Notes (Signed)
[facility] EKG 

## 2012-07-04 ENCOUNTER — Ambulatory Visit: Payer: Medicare Other | Attending: Adult Health | Admitting: Physical Therapy

## 2012-07-04 DIAGNOSIS — M2420 Disorder of ligament, unspecified site: Secondary | ICD-10-CM | POA: Insufficient documentation

## 2012-07-04 DIAGNOSIS — N949 Unspecified condition associated with female genital organs and menstrual cycle: Secondary | ICD-10-CM | POA: Insufficient documentation

## 2012-07-04 DIAGNOSIS — M629 Disorder of muscle, unspecified: Secondary | ICD-10-CM | POA: Insufficient documentation

## 2012-07-04 DIAGNOSIS — IMO0001 Reserved for inherently not codable concepts without codable children: Secondary | ICD-10-CM | POA: Insufficient documentation

## 2012-07-20 ENCOUNTER — Ambulatory Visit: Payer: Medicare Other | Admitting: Physical Therapy

## 2012-07-27 ENCOUNTER — Ambulatory Visit: Payer: Medicare Other | Attending: Adult Health | Admitting: Physical Therapy

## 2012-07-27 DIAGNOSIS — M629 Disorder of muscle, unspecified: Secondary | ICD-10-CM | POA: Insufficient documentation

## 2012-07-27 DIAGNOSIS — M2420 Disorder of ligament, unspecified site: Secondary | ICD-10-CM | POA: Insufficient documentation

## 2012-07-27 DIAGNOSIS — IMO0001 Reserved for inherently not codable concepts without codable children: Secondary | ICD-10-CM | POA: Insufficient documentation

## 2012-07-27 DIAGNOSIS — N949 Unspecified condition associated with female genital organs and menstrual cycle: Secondary | ICD-10-CM | POA: Insufficient documentation

## 2012-08-03 ENCOUNTER — Encounter: Payer: Medicare Other | Admitting: Physical Therapy

## 2012-08-10 ENCOUNTER — Encounter: Payer: Medicare Other | Admitting: Physical Therapy

## 2012-11-01 ENCOUNTER — Inpatient Hospital Stay (HOSPITAL_COMMUNITY): Payer: Medicare Other

## 2012-11-01 ENCOUNTER — Inpatient Hospital Stay (HOSPITAL_COMMUNITY)
Admission: AD | Admit: 2012-11-01 | Discharge: 2012-11-01 | Disposition: A | Discharge status: Home / Self Care | Payer: Medicare Other | Source: Ambulatory Visit | Attending: Emergency Medicine | Admitting: Emergency Medicine

## 2012-11-01 ENCOUNTER — Encounter (HOSPITAL_COMMUNITY): Payer: Self-pay | Admitting: *Deleted

## 2012-11-01 DIAGNOSIS — Z872 Personal history of diseases of the skin and subcutaneous tissue: Secondary | ICD-10-CM | POA: Insufficient documentation

## 2012-11-01 DIAGNOSIS — R209 Unspecified disturbances of skin sensation: Secondary | ICD-10-CM | POA: Insufficient documentation

## 2012-11-01 DIAGNOSIS — Z3202 Encounter for pregnancy test, result negative: Secondary | ICD-10-CM | POA: Insufficient documentation

## 2012-11-01 DIAGNOSIS — D869 Sarcoidosis, unspecified: Secondary | ICD-10-CM

## 2012-11-01 DIAGNOSIS — R200 Anesthesia of skin: Secondary | ICD-10-CM

## 2012-11-01 DIAGNOSIS — R0602 Shortness of breath: Secondary | ICD-10-CM | POA: Insufficient documentation

## 2012-11-01 DIAGNOSIS — F17200 Nicotine dependence, unspecified, uncomplicated: Secondary | ICD-10-CM | POA: Insufficient documentation

## 2012-11-01 DIAGNOSIS — Z79899 Other long term (current) drug therapy: Secondary | ICD-10-CM | POA: Insufficient documentation

## 2012-11-01 DIAGNOSIS — R0789 Other chest pain: Secondary | ICD-10-CM | POA: Insufficient documentation

## 2012-11-01 DIAGNOSIS — J45909 Unspecified asthma, uncomplicated: Secondary | ICD-10-CM | POA: Insufficient documentation

## 2012-11-01 DIAGNOSIS — G510 Bell's palsy: Secondary | ICD-10-CM | POA: Insufficient documentation

## 2012-11-01 DIAGNOSIS — R079 Chest pain, unspecified: Secondary | ICD-10-CM

## 2012-11-01 LAB — URINALYSIS, ROUTINE W REFLEX MICROSCOPIC
Bilirubin Urine: NEGATIVE
Ketones, ur: 15 mg/dL — AB
Leukocytes, UA: NEGATIVE
Nitrite: NEGATIVE
Specific Gravity, Urine: 1.011 (ref 1.005–1.030)
Urobilinogen, UA: 1 mg/dL (ref 0.0–1.0)
pH: 6.5 (ref 5.0–8.0)

## 2012-11-01 LAB — CBC WITH DIFFERENTIAL/PLATELET
Basophils Absolute: 0 10*3/uL (ref 0.0–0.1)
Basophils Relative: 1 % (ref 0–1)
Eosinophils Absolute: 0.2 10*3/uL (ref 0.0–0.7)
Hemoglobin: 11.6 g/dL — ABNORMAL LOW (ref 12.0–15.0)
MCH: 27.8 pg (ref 26.0–34.0)
MCHC: 32.8 g/dL (ref 30.0–36.0)
Neutro Abs: 3.3 10*3/uL (ref 1.7–7.7)
Neutrophils Relative %: 60 % (ref 43–77)
Platelets: 283 10*3/uL (ref 150–400)

## 2012-11-01 LAB — CK TOTAL AND CKMB (NOT AT ARMC)
CK, MB: 1.4 ng/mL (ref 0.3–4.0)
Relative Index: INVALID (ref 0.0–2.5)

## 2012-11-01 LAB — COMPREHENSIVE METABOLIC PANEL
ALT: 13 U/L (ref 0–35)
AST: 15 U/L (ref 0–37)
Calcium: 9.3 mg/dL (ref 8.4–10.5)
Creatinine, Ser: 0.6 mg/dL (ref 0.50–1.10)
GFR calc non Af Amer: >90 mL/min (ref >90)
Sodium: 136 mEq/L (ref 135–145)
Total Protein: 7.5 g/dL (ref 6.0–8.3)

## 2012-11-01 LAB — POCT PREGNANCY, URINE: Preg Test, Ur: NEGATIVE

## 2012-11-01 MED ORDER — PREDNISONE 20 MG PO TABS: 60.0000 mg | ORAL_TABLET | Freq: Every day | ORAL | Status: DC

## 2012-11-01 MED ORDER — IPRATROPIUM BROMIDE 0.02 % IN SOLN
0.5000 mg | Freq: Once | RESPIRATORY_TRACT | Status: AC
  Administered 2012-11-01: 0.5 mg via RESPIRATORY_TRACT
  Filled 2012-11-01: qty 2.5

## 2012-11-01 MED ORDER — ALBUTEROL SULFATE (5 MG/ML) 0.5% IN NEBU
2.5000 mg | INHALATION_SOLUTION | Freq: Once | RESPIRATORY_TRACT | Status: AC
  Administered 2012-11-01: 2.5 mg via RESPIRATORY_TRACT
  Filled 2012-11-01: qty 20

## 2012-11-01 MED ORDER — ALBUTEROL SULFATE HFA 108 (90 BASE) MCG/ACT IN AERS: 1.0000 | INHALATION_SPRAY | Freq: Four times a day (QID) | RESPIRATORY_TRACT | Status: DC | PRN

## 2012-11-01 MED ORDER — PREDNISONE 10 MG PO TABS: 40.0000 mg | ORAL_TABLET | Freq: Every day | ORAL | Status: AC

## 2012-11-01 MED ORDER — ALBUTEROL SULFATE HFA 108 (90 BASE) MCG/ACT IN AERS: 2.0000 | INHALATION_SPRAY | Freq: Once | RESPIRATORY_TRACT | Status: DC

## 2012-11-01 NOTE — MAU Note (Signed)
Pt able to converse and relay complaints with out pausing or any having any shortness of breath/resp distress.

## 2012-11-01 NOTE — Discharge Instructions (Signed)
Chest Pain (Nonspecific) Chest pain has many causes. Your pain could be caused by something serious, such as a heart attack or a blood clot in the lungs. It could also be caused by something less serious, such as a chest bruise or a virus. Follow up with your doctor. More lab tests or other studies may be needed to find the cause of your pain. Most of the time, nonspecific chest pain will improve within 2 to 3 days of rest and mild pain medicine. HOME CARE  For chest bruises, you may put ice on the sore area for 15 to 20 minutes, 3 to 4 times a day. Do this only if it makes you or your child feel better.  Put ice in a plastic bag.  Place a towel between the skin and the bag.  Rest for the next 2 to 3 days.  Go back to work if the pain improves.  See your doctor if the pain lasts longer than 1 to 2 weeks.  Only take medicine as told by your doctor.  Quit smoking if you smoke. GET HELP RIGHT AWAY IF:   There is more pain or pain that spreads to the arm, neck, jaw, back, or belly (abdomen).  You or your child has shortness of breath.  You or your child coughs more than usual or coughs up blood.  You or your child has very bad back or belly pain, feels sick to his or her stomach (nauseous), or throws up (vomits).  You or your child has very bad weakness.  You or your child passes out (faints).  You or your child has a temperature by mouth above 102 F (38.9 C), not controlled by medicine. Any of these problems may be serious and may be an emergency. Do not wait to see if the problems will go away. Get medical help right away. Call your local emergency services 911 in [place] Do not drive yourself to the hospital. MAKE SURE YOU:   Understand these instructions.  Will watch this condition.  Will get help right away if you or your child is not doing well or gets worse. Document Released: 05/31/2008 Document Revised: 03/06/2012 Document Reviewed: 05/31/2008 [facility] Patient  Information 2013 [place], [place].        RESOURCE GUIDE  Chronic Pain Problems: Contact [facility]  [phone] Patients need to be referred by their primary care doctor.  Insufficient Money for Medicine: Contact United Way:  call "211."   No Primary Care Doctor: - Call Health Connect  [phone] - can help you locate a primary care doctor that  accepts your insurance, provides certain services, etc. - Physician Referral Service- [phone]  Agencies that provide inexpensive medical care: - [facility]  [phone] - [facility]  [phone] - [facility]  [phone] - Women's Clinic  [phone] - [facility]  [phone] [facility]  [phone]  Medicaid-accepting [facility] Providers: - [facility]- [address]  [phone], Mon-Fri 9am-7pm, Sat 9am-1pm - [facility]- [address] [place], Suite [place]  [phone] - [facility]- [address] [place]  [phone] [facility]- [address]  [phone] - Renaye Rakers- [address] [place], [address], [phone]  Only accepts [place] patients after they have their name  applied to their card  Self Pay (no insurance) in [place]: - Sickle Cell  Patients: Dr Willey Blade, [facility]  [address] [place], [phone] - [facility]- [address] [place]  [phone]       Patrcia Dolly [facility] [place]- [address]       -     [facility]- see information above (Speak to [employer] if you do not have insurance)       -  [facility]- [address],  [phone]       -  [facility]- [address], [phone]       -  Dr Julio Sicks-  [address], [place], [phone]       -  [facility] - [address], [phone]       -  [facility]- [address], [phone], also [address] [phone]       -    [facility]- [address] [place], [phone], 1st & 3rd Saturday        every month, 10am-1pm  1) Find a Doctor and Pay Out of Pocket Although you won't have to find out who is covered by your insurance plan, it is a good idea to ask around and get recommendations. You will then need to call the office and see if the doctor you have chosen will accept you as a new patient and what types of options they offer for patients who are self-pay. Some doctors offer discounts or will set up payment plans for their patients who do not have insurance, but you will need to ask so you aren't surprised when you get to your appointment.  2) Contact Your Local Health Department Not all health departments have doctors that can see patients for sick visits, but many do, so it is worth a call to see if yours does. If you don't know where your local health department is, you can check in your phone book. The CDC also has a tool to help you locate your state's health department, and many state websites also have listings of all of their local health departments.  3) Find a Walk-in Clinic If your illness is not likely to be very severe or complicated, you may want to try a walk in clinic. These are popping up all over the country in pharmacies, drugstores, and shopping centers. They're usually staffed by nurse practitioners or physician assistants that have been trained to treat common illnesses and complaints. They're usually fairly quick and inexpensive. However, if you have serious medical issues or chronic medical problems, these are probably not your best option  STD Testing - [facility] Department of [facility], STD Clinic, [address], [place], phone [phone] or [phone].  Monday - Friday, call for an appointment. [facility] Department of [employer], STD  Clinic, [address], [place], phone [phone] or [phone].  Monday - Friday, call for an appointment.  Abuse/Neglect: [facility] Child Abuse Hotline 778-337-5355 [facility] Child Abuse Hotline 351 027 2828 (After Hours)  Emergency Shelter:  [facility] 864-612-6891  Maternity Homes: - [facility] [phone] - [facility] [phone]  MRSA Hotline #:   201-434-2223  [facility] Resources [facility] [place]  [employer] [facility] Dept. [address]                 [address]         [address] [place] [address]  Blondell Reveal Phone:  [phone]                                  Phone:  [phone]                   Phone:  [phone]  [facility], [phone] - [facility] [place]- [phone]       -     [facility] in [place], [address],             [phone], Insurance  [place] Child Abuse Hotline (626) 263-0715 or 9064419480 (After Hours)  Dental Assistance  If unable to pay or uninsured, contact:  [facility]. to become qualified for the adult dental clinic.  Patients with Medicaid: [facility] [phone] [address] Qu[address], [phone] [address] [phone]  If unable to pay, or uninsured, contact [facility] [phone] in [place], [phone] in [facility]) to become qualified for the adult dental clinic  Other Low-Cost Community Dental Services: - [facility]- [address] [place], [place], [place], [address], [phone], Ext. 123, 2nd and 4th Thursday of the month at 6:30am.  10 clients each day by appointment, can sometimes see walk-in patients if someone does not show for an appointment. [facility]- [address] [place], [place], [address], [phone] - [facility] [address] [place], [place], [address], [phone] - [facility]- [phone] [facility]- [phone] [facility] Health [phone]       Behavioral Health Resources in the [facility] - Eat  Intensive Outpatient Programs: [facility]      [address] [place], [place] [phone] Both a day and evening program       [facility]     [address]        [place], [place] [address] [phone]         ADS: [facility] Drug [facility] [address] [place] [phone]  [facility] ACCESS LINE: 305-703-1046 or [phone] [address] [place], [place] [address] [email or web address]  Behavioral Health Services  Substance Abuse Resources: - [facility] Drug [facility]  (440) [phone] - [facility] 531-629-9887 - [facility] ([phone] Floydene Flock [phone] - Residential & Outpatient Substance Abuse Program  ([phone]  Psychological Services: [facility]  ([phone] [facility] Services  (564) [phone] - [facility], [phone] [place]. [address] [place], ACCESS LINE: 463-762-6953 or (920)[phone], [email or web address]  Mobile Crisis Teams:  [facility]rapeutic Alternatives         Mobile Crisis Care Unit (972) [phone]             [facility] [address] Ginette Otto [phone]                                         Interventionist [address] [place] [address] [place] [phone]  Self-Help/Support Groups: Mental Health Assoc. of [employer] of support groups (951)241-4278 (call for more info)   Narcotics Anonymous (NA) Caring Services [address] [place] - 2 meetings at this location  Residential Treatment Programs:  [facility]      [address]        [place]       [phone]         [facility] [address] [place] [address] [place], [place]  [address] [phone]  [facility]  [address] [place], [place] [address] [phone] Admissions: 8am-3pm M-F  [facility]     [address]        [place], [place] [address]       [phone]         [facility] [address] [place], [place] [phone]  [facility] [address] [place], [place] [phone]  [facility] - Intensive Outpatient      [address]     [place], [place]       [phone]         [facility] (Addiction Recovery [facility]     [address] [place], [place] [phone] or [phone]  Residential Treatment Services (RTS), Medicaid [address] [place], [place] [phone]  [facility] [address]                                               [address] [place] [phone]  [facility] Resources: [place] [phone]               General Therapy                                                Angie Fava, PhD        [address] [place], [place] [address]         [phone]   Insurance  [facility] Behavioral   [address] [place], [place] [address] [phone]  [facility] [address] [place], [place] [address] [phone] Insurance/Medicaid/sponsorship through [facility]  Faith and Families                                              [address] [phone]  [place], [place] [address]    Therapy/tele-psych/case         [phone]          [facility] [address], [place]  [address]  Adolescent/group home/case management [phone]                                            Creola Corn PhD       General therapy       Insurance   [phone]         Dr. Lolly Mustache, Insurance, M-F [phone]

## 2012-11-01 NOTE — ED Notes (Signed)
Patient was placed on 2 L  for comfort only.

## 2012-11-01 NOTE — MAU Note (Signed)
Face is symmetrica; smile and tonguel.  No drag/ fall when raises arms.  Speech is clear.

## 2012-11-01 NOTE — ED Notes (Signed)
Patient states she has been having intermittent left sided chest pain described as sharp stabbing pain. She gets shortness of breath. She states she wakes up in the morning feeling like her heart is racing and she becomes anxious. States the chest pain increases with a deep and with activity.

## 2012-11-01 NOTE — ED Provider Notes (Signed)
History     CSN: [id number]  Arrival date & time 11/01/12  1223   None     Chief Complaint  Patient presents with  . facial numbness    . Chest Pain  . Shortness of Breath    (Consider location/radiation/quality/duration/timing/severity/associated sxs/prior treatment) HPI Presents as transfer [facility] hospital. Intermittent left sided chest pain for two months. Pain described as sharp, burn in intensity 5/10. The location of the patient's problem is left side chest with movement to middle.  Onset was two months ago with intermittent course since that time.   Modifying factors:  Worse with exertion, positions. Nothing makes it better.  Associated symptoms: anxiety. She states she wakes up in the morning and her heart beats fast. Also complains of some R side facial tingling.   Past Medical History  Diagnosis Date  . Sarcoidosis 2006  . Seasonal allergic rhinitis   . Asthma   . Eczema   . Bell's palsy 2006  . Interstitial cystitis     Past Surgical History  Procedure Date  . Dilation and curettage of uterus 1997  . Cystoscopy 2011    Family History  Problem Relation Age of Onset  . Brain cancer Maternal Grandmother     History  Substance Use Topics  . Smoking status: Current Some Day Smoker -- 0.3 packs/day for 6 years    Last Attempt to Quit: 09/20/2011  . Smokeless tobacco: Never Used  . Alcohol Use: No    OB History    Grav Para Term Preterm Abortions TAB SAB Ect Mult Living   1 0 0 0 1 0 1 0 0 0       Review of Systems Negative for respiratory distress.  Positive for cough. Clear.  Negative for dysuria. Negative for vomiting, diarrhea. LMP last week.  Positive for blood at end of stool. Has to strain.   All other systems reviewed and negative unless noted in HPI.    Allergies  Dust mite extract and Pollen extract  Home Medications   Current Outpatient Rx  Name  Route  Sig  Dispense  Refill  . IBUPROFEN 200 MG PO TABS   Oral   Take 200 mg  by mouth every 6 (six) hours as needed. pain         . ALBUTEROL SULFATE HFA 108 (90 BASE) MCG/ACT IN AERS   Inhalation   Inhale 2 puffs into the lungs every 6 (six) hours as needed. For shortness of breath           BP 121/67  Pulse 65  Resp 18  Ht 5\' 7"  (1.702 m)  Wt 150 lb (68.04 kg)  BMI 23.49 kg/m2  SpO2 100%  LMP 10/22/2012  Physical Exam Nursing note and vitals reviewed.  Constitutional: Pt is alert and appears stated age. Oropharynx: Airway open without erythema or exudate. Respiratory: No respiratory distress. Equal breathing bilaterally. CV: Extremities warm and well perfused. Neuro: No motor nor sensory deficit. Head: Normocephalic and atraumatic. Eyes: No conjunctivitis, no scleral icterus. Neck: Supple, no mass. Chest: Reproducible pain.  Abdomen: Soft, non-tender MSK: Extremities are atraumatic without deformity. Skin: No rash, no wounds.  ED Course  Procedures (including critical care time)  Labs Reviewed  COMPREHENSIVE METABOLIC PANEL - Abnormal; Notable for the following:    Potassium 3.2 (*)     Glucose, Bld 115 (*)     All other components within normal limits  URINALYSIS, ROUTINE W REFLEX MICROSCOPIC - Abnormal; Notable for the following:  Ketones, ur 15 (*)     All other components within normal limits  CBC WITH DIFFERENTIAL - Abnormal; Notable for the following:    Hemoglobin 11.6 (*)     HCT 35.4 (*)     All other components within normal limits  CK TOTAL AND CKMB  POCT PREGNANCY, URINE  TSH  T4, FREE   Dg Chest 2 View  11/01/2012  *RADIOLOGY REPORT*  Clinical Data: Chest pain and shortness of breath  CHEST - 2 VIEW  Comparison: 07/22/2011  Findings:  Normal heart size.  No pleural effusion or edema.  No airspace consolidation.  Bilateral upper lobe pulmonary parenchymal scarring appears similar to previous exam.  No airspace consolidation.  IMPRESSION:  1.  No acute cardiopulmonary abnormalities. 2.  Stable bilateral upper lobe  scarring.   Original Report Authenticated By: Signa Kell, M.D.      1. Right facial numbness   2. Chest pain   3. Sarcoidosis       MDM  36 y.o. female here chest pain, shortness of breath.  Pertinent past problems include sarcoidosis, asthma, bell's palsy. [facility] hospital due to concern for chest pain and facial weakness. Pt with no signs of acute neurologic event or stroke. Chest pain not c/w ACS. Low risk Well's/PERC neg. Pt given duoneb breathing treatment with resolution of chest pain. Also given steroid.  EKG ordered and interpreted by me: NSR, no axis deviation, no QRS widening, LBBB and no ST segment changes. Lab tests ordered and reviewed by me: CMP, CBC unremarkable. UA neg for infection. Upreg neg. TSH/T4 pending.  I independently viewed the following imaging studies and reviewed radiology's interpretation as summarized: CXR with no acute disease.  Course of care: Discussed results with patient who is not resting comfortably with no complaints. Feel pt is appropriate for d/c with close f/u. Counseling provided regarding diagnosis, treatment plan, follow up recommendations, and return precautions. Questions answered  Medical Decision Making discussed with ED attending Gwyneth Sprout, MD          Charm Barges, MD 11/01/12 [phone]

## 2012-11-01 NOTE — MAU Note (Signed)
With breasts have been having pain... Questioned chest pain states is both..  Pain on left side- throbbing burning in left breast.  Today was hurting straight through.  Chest pain  For past 3 days, rt side of face started going numb 2 days.  Last night became very nauseated.  Hx of bells palsy on rt side - "when her face shut down"

## 2012-11-01 NOTE — MAU Provider Note (Signed)
  History     CSN: [id number]  Arrival date and time: 11/01/12 1223   None     No chief complaint on file.  HPI  Rhonda Hester is a 36 y.o. G1P0010 who presents today with facial numbness and chest pain. She states that the facial numbness began about 2 days ago, and has gotten worse. She has had chest pain off and on for 2 months, but today after walking to pass out fliers it became much worse.   Past Medical History  Diagnosis Date  . Sarcoidosis 2006  . Seasonal allergic rhinitis   . Asthma   . Eczema   . Bell's palsy 2006  . Interstitial cystitis     Past Surgical History  Procedure Date  . Dilation and curettage of uterus 1997  . Cystoscopy 2011    Family History  Problem Relation Age of Onset  . Brain cancer Maternal Grandmother     History  Substance Use Topics  . Smoking status: Current Some Day Smoker -- 0.3 packs/day for 6 years    Last Attempt to Quit: 09/20/2011  . Smokeless tobacco: Never Used  . Alcohol Use: No    Allergies: No Known Allergies  Prescriptions prior to admission  Medication Sig Dispense Refill  . albuterol (VENTOLIN HFA) 108 (90 BASE) MCG/ACT inhaler Inhale 2 puffs into the lungs every 6 (six) hours as needed. For shortness of breath      . Fluticasone-Salmeterol (ADVAIR DISKUS) 250-50 MCG/DOSE AEPB Inhale 1 puff into the lungs 2 (two) times daily.  1 each  11  . pentosan polysulfate (ELMIRON) 100 MG capsule Take 100 mg by mouth 2 (two) times daily.        Review of Systems  Constitutional: Negative for fever.  Respiratory: Negative for cough.   Cardiovascular: Positive for chest pain.  Gastrointestinal: Negative for heartburn, nausea, vomiting and abdominal pain.  Neurological: Positive for sensory change. Negative for headaches.   Physical Exam   Blood pressure 121/67, pulse 65, resp. rate 18, last menstrual period 10/22/2012, SpO2 100.00%.  Physical Exam  Nursing note and vitals reviewed. Constitutional: She is  oriented to person, place, and time. She appears well-developed and well-nourished.  HENT:  Head: Normocephalic.       Right facial drooping and asymmetrical smile.   Neurological: She is alert and oriented to person, place, and time.  Skin: Skin is warm and dry.    MAU Course  Procedures  1258: Dr. Manus Gunning at [facility] ED notified, and will accept the patient.   Assessment and Plan  Right facial numbness Chest pain  Start IV [facility] CMP, CK, CKMB pedning  Tawnya Crook 11/01/2012, 1:03 PM

## 2012-11-02 LAB — TSH: TSH: 1.553 u[IU]/mL (ref 0.350–4.500)

## 2012-11-02 LAB — T4, FREE: Free T4: 1.22 ng/dL (ref 0.80–1.80)

## 2012-11-04 NOTE — ED Provider Notes (Signed)
I saw and evaluated the patient, reviewed the resident's note and I agree with the findings and plan. I have reviewed EKG and agree with the resident interpretation.  you Patient with a history of sarcoidosis coming in complaining of chest pain and wheezing. She has chest wall pain that is not concerning for cardiac nature. Her EKG is within normal limits and chest x-ray is negative. She is PERC negative. After breathing treatment patient is feeling much better. Secondly patient complains of being anxious, unable to sleep and weight loss. Will check thyroid studies which were negative. Patient encouraged to followup with her PMD  Gwyneth Sprout, MD 11/04/12 [phone]

## 2012-12-08 ENCOUNTER — Encounter (HOSPITAL_COMMUNITY): Payer: Self-pay | Admitting: *Deleted

## 2012-12-08 ENCOUNTER — Emergency Department (INDEPENDENT_AMBULATORY_CARE_PROVIDER_SITE_OTHER): Payer: Medicare Other

## 2012-12-08 ENCOUNTER — Emergency Department (INDEPENDENT_AMBULATORY_CARE_PROVIDER_SITE_OTHER)
Admission: EM | Admit: 2012-12-08 | Discharge: 2012-12-08 | Disposition: A | Discharge status: Home / Self Care | Payer: Medicare Other | Source: Home / Self Care

## 2012-12-08 DIAGNOSIS — L923 Foreign body granuloma of the skin and subcutaneous tissue: Secondary | ICD-10-CM

## 2012-12-08 DIAGNOSIS — R071 Chest pain on breathing: Secondary | ICD-10-CM

## 2012-12-08 DIAGNOSIS — R0789 Other chest pain: Secondary | ICD-10-CM

## 2012-12-08 DIAGNOSIS — R0781 Pleurodynia: Secondary | ICD-10-CM

## 2012-12-08 DIAGNOSIS — R079 Chest pain, unspecified: Secondary | ICD-10-CM

## 2012-12-08 MED ORDER — NAPROXEN 500 MG PO TBEC: 500.0000 mg | DELAYED_RELEASE_TABLET | Freq: Two times a day (BID) | ORAL | Status: DC
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
## 2012-12-08 NOTE — ED Provider Notes (Signed)
Medical screening examination/treatment/procedure(s) were performed by resident physician or non-physician practitioner and as supervising physician I was immediately available for consultation/collaboration.   Juelz Claar DOUGLAS MD.    Mazell Aylesworth D Gentle Hoge, MD 12/08/12 1948 

## 2013-01-28 ENCOUNTER — Other Ambulatory Visit: Payer: Self-pay | Admitting: Family Medicine

## 2013-02-21 ENCOUNTER — Encounter (HOSPITAL_COMMUNITY): Payer: Self-pay | Admitting: Emergency Medicine

## 2013-02-21 ENCOUNTER — Emergency Department (HOSPITAL_COMMUNITY)
Admit: 2013-02-21 | Discharge: 2013-02-21 | Disposition: A | Discharge status: Home / Self Care | Payer: Medicare Other | Attending: Emergency Medicine | Admitting: Emergency Medicine

## 2013-02-21 ENCOUNTER — Emergency Department (INDEPENDENT_AMBULATORY_CARE_PROVIDER_SITE_OTHER)
Admission: EM | Admit: 2013-02-21 | Discharge: 2013-02-21 | Disposition: A | Discharge status: Home / Self Care | Payer: Medicare Other | Source: Home / Self Care | Attending: Emergency Medicine | Admitting: Emergency Medicine

## 2013-02-21 DIAGNOSIS — W19XXXA Unspecified fall, initial encounter: Secondary | ICD-10-CM | POA: Insufficient documentation

## 2013-02-21 DIAGNOSIS — S63601A Unspecified sprain of right thumb, initial encounter: Secondary | ICD-10-CM

## 2013-02-21 DIAGNOSIS — S6390XA Sprain of unspecified part of unspecified wrist and hand, initial encounter: Secondary | ICD-10-CM

## 2013-02-21 DIAGNOSIS — M79609 Pain in unspecified limb: Secondary | ICD-10-CM | POA: Insufficient documentation

## 2013-02-21 MED ORDER — MELOXICAM 7.5 MG PO TABS: 7.5000 mg | ORAL_TABLET | Freq: Every day | ORAL | Status: DC

## 2013-02-21 NOTE — ED Provider Notes (Signed)
History     CSN: [id number]  Arrival date & time 02/21/13  1058   First MD Initiated Contact with Patient 02/21/13 1138      Chief Complaint  Patient presents with  . Finger Injury    (Consider location/radiation/quality/duration/timing/severity/associated sxs/prior treatment) HPI Comments: Patient presents urgent care describing about 2 weeks ago she slipped on ice and fell onto concrete hyperextended her right thumb since then her right thumb has been hurting and has been much more swollen than what it is today. He is being applying ice and had been taking some over-the-counter ibuprofen. Denies any numbness or tingling sensation pain is exacerbated with some movements.  The history is provided by the patient.    Past Medical History  Diagnosis Date  . Sarcoidosis 2006  . Seasonal allergic rhinitis   . Asthma   . Eczema   . Bell's palsy 2006  . Interstitial cystitis     Past Surgical History  Procedure Laterality Date  . Dilation and curettage of uterus  1997  . Cystoscopy  2011    Family History  Problem Relation Age of Onset  . Brain cancer Maternal Grandmother     History  Substance Use Topics  . Smoking status: Current Some Day Smoker -- 0.30 packs/day for 6 years    Last Attempt to Quit: 09/20/2011  . Smokeless tobacco: Never Used  . Alcohol Use: No    OB History   Grav Para Term Preterm Abortions TAB SAB Ect Mult Living   1 0 0 0 1 0 1 0 0 0       Review of Systems  Constitutional: Positive for activity change. Negative for fever, chills, diaphoresis, appetite change and fatigue.  Musculoskeletal: Positive for joint swelling. Negative for myalgias, back pain and arthralgias.  Skin: Negative for color change, pallor, rash and wound.  Neurological: Negative for weakness and numbness.    Allergies  Dust mite extract and Pollen extract  Home Medications   Current Outpatient Rx  Name  Route  Sig  Dispense  Refill  . albuterol (PROVENTIL  HFA;VENTOLIN HFA) 108 (90 BASE) MCG/ACT inhaler   Inhalation   Inhale 1-2 puffs into the lungs every 6 (six) hours as needed for wheezing.   1 Inhaler   0   . albuterol (VENTOLIN HFA) 108 (90 BASE) MCG/ACT inhaler   Inhalation   Inhale 2 puffs into the lungs every 6 (six) hours as needed. For shortness of breath         . meloxicam (MOBIC) 7.5 MG tablet   Oral   Take 1 tablet (7.5 mg total) by mouth daily. Take one tablet daily for 2 weeks   14 tablet   0     BP 117/84  Pulse 61  Temp(Src) 98 F (36.7 C) (Oral)  Resp 16  SpO2 100%  LMP 01/18/2013  Physical Exam  Nursing note and vitals reviewed. Constitutional: She appears well-developed and well-nourished.  Musculoskeletal: She exhibits tenderness.       Hands: Neurological: She is alert.  Skin: No rash noted. No erythema.    ED Course  Procedures (including critical care time)  Labs Reviewed - No data to display Dg Hand Complete Right  02/21/2013  *RADIOLOGY REPORT*  Clinical Data: Fall with pain.  RIGHT HAND - COMPLETE 3+ VIEW  Comparison: None.  Findings: No acute osseous or joint abnormality.  No degenerative changes.  IMPRESSION: Negative.   Original Report Authenticated By: Leanna Battles, M.D.  1. Thumb sprain, right, initial encounter       MDM  Right thumb contusion and sprain. Have placed patient in a thumb spica prescribed a anti-inflammatory medicine for 10-14 days and guided to followup with an orthopedic Dr. if pain was to persist for further evaluation and guided physical therapy.       Jimmie Molly, MD 02/21/13 2020

## 2013-02-21 NOTE — ED Notes (Signed)
Pt c/o right thumb inj x2 weeks Recalls slipping on ice and falling onto concrete Sx include: swelling and pain  Denies: head inj/LOC Took advil 45 minutes ago for the pain  She is alert w/no signs of acute distress.

## 2013-06-20 ENCOUNTER — Emergency Department (HOSPITAL_COMMUNITY): Payer: Medicare Other

## 2013-06-20 ENCOUNTER — Emergency Department (HOSPITAL_COMMUNITY)
Admission: EM | Admit: 2013-06-20 | Discharge: 2013-06-20 | Disposition: A | Discharge status: Home / Self Care | Payer: Medicare Other | Attending: Emergency Medicine | Admitting: Emergency Medicine

## 2013-06-20 ENCOUNTER — Encounter (HOSPITAL_COMMUNITY): Payer: Self-pay | Admitting: Emergency Medicine

## 2013-06-20 DIAGNOSIS — M25559 Pain in unspecified hip: Secondary | ICD-10-CM | POA: Insufficient documentation

## 2013-06-20 DIAGNOSIS — Z8619 Personal history of other infectious and parasitic diseases: Secondary | ICD-10-CM | POA: Insufficient documentation

## 2013-06-20 DIAGNOSIS — F17200 Nicotine dependence, unspecified, uncomplicated: Secondary | ICD-10-CM | POA: Insufficient documentation

## 2013-06-20 DIAGNOSIS — J45909 Unspecified asthma, uncomplicated: Secondary | ICD-10-CM | POA: Insufficient documentation

## 2013-06-20 DIAGNOSIS — Z87448 Personal history of other diseases of urinary system: Secondary | ICD-10-CM | POA: Insufficient documentation

## 2013-06-20 DIAGNOSIS — G8929 Other chronic pain: Secondary | ICD-10-CM | POA: Insufficient documentation

## 2013-06-20 DIAGNOSIS — Z872 Personal history of diseases of the skin and subcutaneous tissue: Secondary | ICD-10-CM | POA: Insufficient documentation

## 2013-06-20 DIAGNOSIS — M25569 Pain in unspecified knee: Secondary | ICD-10-CM | POA: Insufficient documentation

## 2013-06-20 DIAGNOSIS — Z8669 Personal history of other diseases of the nervous system and sense organs: Secondary | ICD-10-CM | POA: Insufficient documentation

## 2013-06-20 MED ORDER — HYDROCODONE-ACETAMINOPHEN 5-325 MG PO TABS
1.0000 | ORAL_TABLET | Freq: Once | ORAL | Status: AC
  Administered 2013-06-20: 1 via ORAL
  Filled 2013-06-20: qty 1

## 2013-06-20 MED ORDER — TRAMADOL HCL 50 MG PO TABS: 50.0000 mg | ORAL_TABLET | Freq: Four times a day (QID) | ORAL | Status: DC | PRN

## 2013-06-20 NOTE — ED Provider Notes (Signed)
History    CSN: [id number] Arrival date & time 06/20/13  1011  First MD Initiated Contact with Patient 06/20/13 1049     Chief Complaint  Patient presents with  . Leg Pain   (Consider location/radiation/quality/duration/timing/severity/associated sxs/prior Treatment) HPI Comments: 37 y.o. Female with PMHx of sarcoidosis, bell's palsy, eczema, and asthma presents to day complaining of recurrent right hip and right knee pain. Intermittent over the last 4 months. Denies injury or trauma. Localized. Described as sharp and severe at times. Better with rest. There has been no progression of pain. Pt has not sought medical tx and has taken no interventions.     Patient is a 37 y.o. female presenting with leg pain.  Leg Pain Associated symptoms: no fever and no neck pain    Past Medical History  Diagnosis Date  . Sarcoidosis 2006  . Seasonal allergic rhinitis   . Asthma   . Eczema   . Bell's palsy 2006  . Interstitial cystitis    Past Surgical History  Procedure Laterality Date  . Dilation and curettage of uterus  1997  . Cystoscopy  2011   Family History  Problem Relation Age of Onset  . Brain cancer Maternal Grandmother    History  Substance Use Topics  . Smoking status: Current Some Day Smoker -- 0.30 packs/day for 6 years    Types: Cigarettes    Last Attempt to Quit: 09/20/2011  . Smokeless tobacco: Never Used  . Alcohol Use: No   OB History   Grav Para Term Preterm Abortions TAB SAB Ect Mult Living   1 0 0 0 1 0 1 0 0 0      Review of Systems  Constitutional: Negative for fever and diaphoresis.  HENT: Negative for neck pain and neck stiffness.   Eyes: Negative for visual disturbance.  Respiratory: Negative for apnea, chest tightness and shortness of breath.   Cardiovascular: Negative for chest pain and palpitations.  Gastrointestinal: Negative for nausea, vomiting, diarrhea and constipation.  Genitourinary: Negative for dysuria.  Musculoskeletal: Positive for  arthralgias. Negative for gait problem.       Right hip, right knee  Skin: Negative for rash.  Neurological: Negative for dizziness, weakness, light-headedness, numbness and headaches.    Allergies  Citric acid; Dust mite extract; Pollen extract; and Nickel  Home Medications   Current Outpatient Rx  Name  Route  Sig  Dispense  Refill  . acetaminophen (TYLENOL) 500 MG tablet   Oral   Take 1,000 mg by mouth every 6 (six) hours as needed for pain.         Marland Kitchen albuterol (PROVENTIL HFA;VENTOLIN HFA) 108 (90 BASE) MCG/ACT inhaler   Inhalation   Inhale 1-2 puffs into the lungs every 6 (six) hours as needed for wheezing.   1 Inhaler   0   . ibuprofen (ADVIL,MOTRIN) 200 MG tablet   Oral   Take 400-600 mg by mouth every 6 (six) hours as needed for pain.         Marland Kitchen PRESCRIPTION MEDICATION   Topical   Apply 1 application topically daily.         . traMADol (ULTRAM) 50 MG tablet   Oral   Take 1 tablet (50 mg total) by mouth every 6 (six) hours as needed for pain.   30 tablet   0    BP 117/75  Pulse 61  Temp(Src) 98.4 F (36.9 C) (Oral)  Resp 18  SpO2 98%  LMP 06/14/2013 Physical Exam  Nursing  note and vitals reviewed. Constitutional: She is oriented to person, place, and time. She appears well-developed and well-nourished. No distress.  HENT:  Head: Normocephalic and atraumatic.  Eyes: Conjunctivae and EOM are normal. Pupils are equal, round, and reactive to light.  Neck: Normal range of motion. Neck supple.  No meningeal signs  Cardiovascular: Normal rate, regular rhythm and normal heart sounds.  Exam reveals no gallop and no friction rub.   No murmur heard. Pulmonary/Chest: Effort normal and breath sounds normal. No respiratory distress. She has no wheezes. She has no rales. She exhibits no tenderness.  Abdominal: Soft. Bowel sounds are normal. She exhibits no distension. There is no tenderness. There is no rebound and no guarding.  Musculoskeletal: Normal range of  motion. She exhibits no edema and no tenderness.  5/5 strength throughout. No swelling. No erythema. No warmth. No effusion. Good quadricep strength on straight leg raise. No joint laxity. Good ROM throughout  Neurological: She is alert and oriented to person, place, and time. No cranial nerve deficit.  Speech is clear and goal oriented, follows commands Sensation normal to light touch and two point discrimination Moves extremities without ataxia, coordination intact Normal gait and balance Normal strength in upper and lower extremities bilaterally including dorsiflexion and plantar flexion, strong and equal grip strength   Skin: Skin is warm and dry. She is not diaphoretic. No erythema.  Psychiatric: She has a normal mood and affect.    ED Course  Procedures (including critical care time) Labs Reviewed - No data to display Dg Hip Complete Right  06/20/2013   *RADIOLOGY REPORT*  Clinical Data: Right leg cramping.  No known injury.  RIGHT HIP - COMPLETE 2+ VIEW  Comparison: Right hip MRI 10/22/2008.  Findings: The mineralization and alignment are normal.  There is no evidence of acute fracture or dislocation.  There is no evidence of femoral head avascular necrosis.  The hip and sacroiliac joint spaces are maintained.  Left pelvic calcification is likely a phlebolith.  IMPRESSION: No acute osseous findings or significant arthropathic changes.   Original Report Authenticated By: Carey Bullocks, M.D.   Dg Knee Complete 4 Views Right  06/20/2013   *RADIOLOGY REPORT*  Clinical Data: Right leg cramping.  No known injury.  RIGHT KNEE - COMPLETE 4+ VIEW  Comparison: Radiographs 10/28/2007.  Findings: The mineralization and alignment are normal.  There is no evidence of acute fracture or dislocation.  The joint spaces are maintained.  There is no significant knee joint effusion.  IMPRESSION: Stable normal right knee radiographs.   Original Report Authenticated By: Carey Bullocks, M.D.   1. Knee pain,  chronic, right   2. Chronic right hip pain     MDM  Imaging shows no fracture. Directed pt to ice injury, take acetaminophen or ibuprofen for pain, and to elevate and rest the injury when possible. Provided knee immobilizer and crutches for comfort and directed pt to follow up with ortho. Discussed reasons to seek immediate care. Patient expresses understanding and agrees with plan.   Glade Nurse, PA-C 06/20/13 1656

## 2013-06-20 NOTE — ED Notes (Signed)
Pt reports having right hip and shooting right leg pain. Denies recent injury. Denies loss of bowel/bladder. Moving all extremities well. Speaking in full sentences, alert, NAD at present.

## 2013-06-21 NOTE — ED Provider Notes (Signed)
Medical screening examination/treatment/procedure(s) were performed by non-physician practitioner and as supervising physician I was immediately available for consultation/collaboration.  Zanovia Rotz M Uchechi Denison, MD 06/21/13 1411 

## 2013-08-03 ENCOUNTER — Telehealth: Payer: Self-pay | Admitting: Pulmonary Disease

## 2013-08-03 NOTE — Telephone Encounter (Signed)
I spoke with Pam. She stated pt needing surgical clearance for cysto/hydro distension of the bladder. VS is out until 08/24/13. Pt is scheduled to see MW on 08/06/13 at 3:15. Nothing further needed and pam will make pt aware of appt.

## 2013-08-06 ENCOUNTER — Ambulatory Visit (INDEPENDENT_AMBULATORY_CARE_PROVIDER_SITE_OTHER)
Admission: RE | Admit: 2013-08-06 | Discharge: 2013-08-06 | Disposition: A | Discharge status: Home / Self Care | Payer: Medicare Other | Source: Ambulatory Visit | Attending: Internal Medicine | Admitting: Internal Medicine

## 2013-08-06 ENCOUNTER — Ambulatory Visit (INDEPENDENT_AMBULATORY_CARE_PROVIDER_SITE_OTHER): Payer: Medicare Other | Admitting: Internal Medicine

## 2013-08-06 ENCOUNTER — Encounter: Payer: Self-pay | Admitting: Internal Medicine

## 2013-08-06 VITALS — BP 94/60 | HR 76 | Temp 98.6°F | Ht 68.0 in | Wt 172.0 lb

## 2013-08-06 DIAGNOSIS — D869 Sarcoidosis, unspecified: Secondary | ICD-10-CM

## 2013-08-06 DIAGNOSIS — F17200 Nicotine dependence, unspecified, uncomplicated: Secondary | ICD-10-CM

## 2013-08-06 DIAGNOSIS — J45909 Unspecified asthma, uncomplicated: Secondary | ICD-10-CM

## 2013-08-06 MED ORDER — MOMETASONE FURO-FORMOTEROL FUM 200-5 MCG/ACT IN AERO: INHALATION_SPRAY | RESPIRATORY_TRACT | Status: DC

## 2013-08-06 NOTE — Patient Instructions (Addendum)
Please remember to go to the x-ray department downstairs for your tests - we will call you with the results when they are available.    Start dulera 200 Take 2 puffs first thing in am and then another 2 puffs about 12 hours later.   Work on inhaler technique:  relax and gently blow all the way out then take a nice smooth deep breath back in, triggering the inhaler at same time you start breathing in.  Hold for up to 5 seconds if you can.  Rinse and gargle with water when done  Only use your albuterol as a rescue medication to be used if you can't catch your breath by resting or doing a relaxed purse lip breathing pattern. The less you use it, the better it will work when you need it.   Please schedule a follow up office visit in 2 weeks, sooner if needed with all inhalers in hand

## 2013-08-06 NOTE — Progress Notes (Signed)
  Subjective:    Patient ID: Rhonda Hester, female    DOB: [birth year]    MRN: [id number]  Brief patient profile:  36 yobf  with Asthma, Allergic rhinitis and Sarcoidosis followed by Dr Craige Cotta    HPI 08/06/2013  preop consultation/Hanna Ra active smoking  Re clearance for bladder surgery  Chief Complaint  Patient presents with  . Follow-up    Pt needs surgery clearance. She reports having increased SOB for the past 2 months. She also c/o sore throat- symptoms seem worse in the am.    Totally confused re meds, using "pump" bid but  not really helping and having trouble sleeping. Doe x > slow adls and sleeping poorly due to sob flat  No obvious daytime variabilty or assoc chronic cough or cp or chest tightness, subjective wheeze overt sinus or hb symptoms. No unusual exp hx or h/o childhood pna/ asthma or knowledge of premature birth.   Also denies any obvious fluctuation of symptoms with weather or environmental changes or other aggravating or alleviating factors except as outlined above   Current Medications, Allergies, Past Medical History, Past Surgical History, Family History, and Social History were reviewed in [employer] record.  ROS  The following are not active complaints unless bolded sore throat, dysphagia, dental problems, itching, sneezing,  nasal congestion or excess/ purulent secretions, ear ache,   fever, chills, sweats, unintended wt loss, pleuritic or exertional cp, hemoptysis,  orthopnea pnd or leg swelling, presyncope, palpitations, heartburn, abdominal pain, anorexia, nausea, vomiting, diarrhea  or change in bowel or urinary habits, change in stools or urine, dysuria,hematuria,  rash, arthralgias, visual complaints, headache, numbness weakness or ataxia or problems with walking or coordination,  change in mood/affect or memory.      Past Medical History  Diagnosis Date  . Sarcoidosis 2006  . Seasonal allergic rhinitis   . Asthma   . Eczema   .  Bell's palsy 2006    Family History  Problem Relation Age of Onset  . Brain cancer Maternal Grandmother     History   Social History  . Marital Status: Single   Social History Main Topics  . Smoking status: Former Smoker -- 0.3 packs/day for 6 years    Quit date: 06/11/2011  . Smokeless tobacco: Current User  . Alcohol Use: No  . Drug Use: No         Objective:   Physical Exam  Wt Readings from Last 3 Encounters:  08/06/13 172 lb (78.019 kg)  11/01/12 150 lb (68.04 kg)  09/02/11 200 lb (90.719 kg)      General - healthy  HEENT - PERRLA, EOMI, clear nasal discharge, no sinus tenderness, no oral exudate, no LAN, no thyromegaly  Cardiac - s1s2 regular, no murmur, pulses symmetric  Chest - b/l wheeze, no rales/dullness  Abd - soft, non-tender, no organomegaly  Ext - no e/c/c  Neuro - normal strength, CN intact  Psych - normal mood, behavior  Skin - no rashes  PFT 09/02/11>>FEV1 2.80(93%), FEV1% 82, TLC 4.74(89%), DLCO 59%, DLCO/VA 95%, no BD response.   CXR  08/06/2013 :  Pulmonary parenchymal findings compatible with chronic sarcoidosis. No definite acute superimposed abnormality is identified.     Assessment & Plan:

## 2013-08-07 DIAGNOSIS — Z87891 Personal history of nicotine dependence: Secondary | ICD-10-CM | POA: Insufficient documentation

## 2013-08-07 NOTE — Progress Notes (Signed)
Quick Note:  Spoke with pt and notified of results per Dr. Wert. Pt verbalized understanding and denied any questions.  ______ 

## 2013-08-07 NOTE — Assessment & Plan Note (Signed)
Strongly encouraged to quit x 2 prior to elective surgery

## 2013-08-07 NOTE — Assessment & Plan Note (Signed)
CT chest 12/30/04>>b/l hilar and mediastinal adenopathy with b/l nodular interstitial opacities CT chest 07/08/08>>decreased lymph nodes, scarring with volume loss PFT 07/04/08>>FEV1 2.55(83%), FEV1% 84, TLC 4.43(83%), DLCO 54%, no BD ESR 07/22/11>>45 PFT 09/02/11>>FEV1 2.80(93%), FEV1% 82, TLC 4.74(89%), DLCO 59%, DLCO/VA 95%, no BD response.  No evidence of active sarcoid though could have airways involvement which improve on dulera

## 2013-08-07 NOTE — Assessment & Plan Note (Signed)
IgE 07/22/11>>71.8 RAST 07/22/11>>Multiple allergens PFT 09/02/11>>FEV1 2.80(93%), FEV1% 82, TLC 4.74(89%), DLCO 59%, DLCO/VA 95%, no BD response. - hfa 75% 08/06/2013   DDX of  difficult airways managment all start with A and  include Adherence, Ace Inhibitors, Acid Reflux, Active Sinus Disease, Alpha 1 Antitripsin deficiency, Anxiety masquerading as Airways dz,  ABPA,  allergy(esp in young), Aspiration (esp in elderly), Adverse effects of DPI,  Active smokers, plus two Bs  = Bronchiectasis and Beta blocker use..and one C= CHF  Adherence is always the initial "prime suspect" and is a multilayered concern that requires a "trust but verify" approach in every patient - starting with knowing how to use medications, especially inhalers, correctly, keeping up with refills and understanding the fundamental difference between maintenance and prns vs those medications only taken for a very short course and then stopped and not refilled. The proper method of use, as well as anticipated side effects, of a metered-dose inhaler are discussed and demonstrated to the patient. Improved effectiveness after extensive coaching during this visit to a level of approximately  75% so start dulera 200 2bid and see if can reduce saba (first need to identify what she uses, though, so should return with all inhalers)  Active smoking > strongly encouraged to quit.

## 2013-08-20 ENCOUNTER — Ambulatory Visit (INDEPENDENT_AMBULATORY_CARE_PROVIDER_SITE_OTHER): Payer: Medicare Other | Admitting: Internal Medicine

## 2013-08-20 ENCOUNTER — Encounter: Payer: Self-pay | Admitting: Internal Medicine

## 2013-08-20 VITALS — BP 120/78 | HR 68 | Temp 98.5°F | Ht 67.0 in | Wt 171.2 lb

## 2013-08-20 DIAGNOSIS — J45909 Unspecified asthma, uncomplicated: Secondary | ICD-10-CM

## 2013-08-20 DIAGNOSIS — F17200 Nicotine dependence, unspecified, uncomplicated: Secondary | ICD-10-CM

## 2013-08-20 DIAGNOSIS — D869 Sarcoidosis, unspecified: Secondary | ICD-10-CM

## 2013-08-20 MED ORDER — MOMETASONE FURO-FORMOTEROL FUM 200-5 MCG/ACT IN AERO: INHALATION_SPRAY | RESPIRATORY_TRACT | Status: DC

## 2013-08-20 NOTE — Progress Notes (Signed)
Subjective:    Patient ID: Rhonda Hester, female    DOB: 1976/10/19    MRN: [id number]  Brief patient profile:  36 yobf  with Asthma, Allergic rhinitis and Sarcoidosis followed by Dr Craige Cotta    HPI 08/06/2013  preop consultation/Wert active smoking  Re clearance for bladder surgery  Chief Complaint  Patient presents with  . Follow-up    Pt needs surgery clearance. She reports having increased SOB for the past 2 months. She also c/o sore throat- symptoms seem worse in the am.   Totally confused re meds, using "pump" bid but  not really helping and having trouble sleeping. Doe x > slow adls and sleeping poorly due to sob flat rec  Start dulera 200 Take 2 puffs first thing in am and then another 2 puffs about 12 hours later.  Work on inhaler technique:  Only use your albuterol   08/20/2013 f/u ov/Wert on dulera 200 bid main  Chief Complaint  Patient presents with  . Follow-up    Pt states breathing has improved some since the last visit, but still c/o feeling SOB when she lies down at night.   nasal obst worse at hs but breathing o/w better on dulera   Last took prednisone 2014  For breathing and coughing and stuffy nose - prev rx for nasal steroids but insurance would not cover.  No limiting sob  and no need for saba     No obvious daytime variabilty or assoc chronic cough or cp or chest tightness, subjective wheeze overt   hb symptoms. No unusual exp hx or h/o childhood pna/ asthma or knowledge of premature birth.   Also denies any obvious fluctuation of symptoms with weather or environmental changes or other aggravating or alleviating factors except as outlined above   Current Medications, Allergies, Past Medical History, Past Surgical History, Family History, and Social History were reviewed in [employer] record.  ROS  The following are not active complaints unless bolded sore throat, dysphagia, dental problems, itching, sneezing,  nasal congestion or  excess/ purulent secretions, ear ache,   fever, chills, sweats, unintended wt loss, pleuritic or exertional cp, hemoptysis,  orthopnea pnd or leg swelling, presyncope, palpitations, heartburn, abdominal pain, anorexia, nausea, vomiting, diarrhea  or change in bowel or urinary habits, change in stools or urine, dysuria,hematuria,  rash, arthralgias, visual complaints, headache, numbness weakness or ataxia or problems with walking or coordination,  change in mood/affect or memory.      Past Medical History  Diagnosis Date  . Sarcoidosis 2006  . Seasonal allergic rhinitis   . Asthma   . Eczema   . Bell's palsy 2006    Family History  Problem Relation Age of Onset  . Brain cancer Maternal Grandmother     History   Social History  . Marital Status: Single   Social History Main Topics  . Smoking status: Former Smoker -- 0.3 packs/day for 6 years    Quit date: 06/11/2011  . Smokeless tobacco: Current User  . Alcohol Use: No  . Drug Use: No         Objective:   Physical Exam   08/20/2013       171  Wt Readings from Last 3 Encounters:  08/06/13 172 lb (78.019 kg)  11/01/12 150 lb (68.04 kg)  09/02/11 200 lb (90.719 kg)      General - healthy amb bf nad HEENT - PERRLA, EOMI, clear nasal discharge, no sinus tenderness, no oral exudate, no  LAN, no thyromegaly  Cardiac - s1s2 regular, no murmur, pulses symmetric  Chest - b/l wheeze, no rales/dullness  Abd - soft, non-tender, no organomegaly  Ext - no e/c/c  Neuro - normal strength, CN intact  Psych - normal mood, behavior  Skin - no rashes  PFT 09/02/11>>FEV1 2.80(93%), FEV1% 82, TLC 4.74(89%), DLCO 59%, DLCO/VA 95%, no BD response.   CXR  08/06/2013 :  Pulmonary parenchymal findings compatible with chronic sarcoidosis. No definite acute superimposed abnormality is identified.     Assessment & Plan:

## 2013-08-20 NOTE — Patient Instructions (Addendum)
Continue dulera 200 Take 2 puffs first thing in am and then another 2 puffs about 12 hours later.   Work on inhaler technique:  relax and gently blow all the way out then take a nice smooth deep breath back in, triggering the inhaler at same time you start breathing in.  Hold for up to 5 seconds if you can.  Then out through the nose  You are cleared for surgery   Please schedule a follow up office visit in 4 weeks, sooner if needed with pfts

## 2013-08-20 NOTE — Assessment & Plan Note (Signed)
IgE 07/22/11>>71.8 RAST 07/22/11>>Multiple allergens PFT 09/02/11>>FEV1 2.80(93%), FEV1% 82, TLC 4.74(89%), DLCO 59%, DLCO/VA 95%, no BD response.   DDX of  difficult airways managment all start with A and  include Adherence, Ace Inhibitors, Acid Reflux, Active Sinus Disease, Alpha 1 Antitripsin deficiency, Anxiety masquerading as Airways dz,  ABPA,  allergy(esp in young), Aspiration (esp in elderly), Adverse effects of DPI,  Active smokers, plus two Bs  = Bronchiectasis and Beta blocker use..and one C= CHF Adherence is always the initial "prime suspect" and is a multilayered concern that requires a "trust but verify" approach in every patient - starting with knowing how to use medications, especially inhalers, correctly, keeping up with refills and understanding the fundamental difference between maintenance and prns vs those medications only taken for a very short course and then stopped and not refilled. The proper method of use, as well as anticipated side effects, of a metered-dose inhaler are discussed and demonstrated to the patient. Improved effectiveness after extensive coaching during this visit to a level of approximately  75%  All goals of chronic asthma control met including optimal function and elimination of symptoms with minimal need for rescue therapy.  Contingencies discussed in full including contacting this office immediately if not controlling the symptoms using the rule of two's.     Ok for surgery

## 2013-08-20 NOTE — Assessment & Plan Note (Signed)
CT chest 12/30/04>>b/l hilar and mediastinal adenopathy with b/l nodular interstitial opacities CT chest 07/08/08>>decreased lymph nodes, scarring with volume loss PFT 07/04/08>>FEV1 2.55(83%), FEV1% 84, TLC 4.43(83%), DLCO 54%, no BD ESR 07/22/11 = 45  No evidence of systemic dz - it is possible she has upper and lower resp tract involvement but doubt it at this point doing fine off systemic steroids x 6 months and therefore cleared for surgery

## 2013-08-20 NOTE — Assessment & Plan Note (Signed)
>   3 min discussion  I took an extended  opportunity with this patient to outline the consequences of continued cigarette use  in airway disorders based on all the data we have from the multiple national lung health studies (perfomed over decades at millions of dollars in cost)  indicating that smoking cessation, not choice of inhalers or physicians, is the most important aspect of care.   

## 2013-08-22 ENCOUNTER — Encounter (HOSPITAL_BASED_OUTPATIENT_CLINIC_OR_DEPARTMENT_OTHER): Payer: Self-pay | Admitting: *Deleted

## 2013-08-22 ENCOUNTER — Other Ambulatory Visit: Payer: Self-pay | Admitting: Urology

## 2013-08-22 MED ORDER — PHENAZOPYRIDINE HCL 200 MG PO TABS: Freq: Once | ORAL | Status: DC

## 2013-08-22 NOTE — H&P (Signed)
History of Present Illness             She returns for followup of her interstitial cystitis.  -Jun 2011 underwent cysto/HD -- 550 ml bladder capacity. On Elmiron.  Tried Elavil (stopped it as it was too sedating and she "did not like how it made her body feel")  SP pain and pressure that is constant. Her pain doesn't change with voiding. Tomato products and OJ makes her symptoms worse. She's also had a lot of anxiety recently and trouble sleeping.  -Feb 2013 tried Hydroxyzine 25 mg 1 po QHS;  very tearful today due to "stress and personal issues." -May 2013 tried Elmiron instillations - stopped -- made urethra white, thick and sticky, expensive.  -Jul 2013 - tried cimetidine -Jul 2014 - pelvic pain, ("I'm inappropriate with my friends and family.").  Tried Zyrtec and elmiron Tried DMSo bladder instillation x 2 Tried toradol and Rx for Sprix (too expensive)   Interval hx She returns and complains of SP pain and discomfort. She may have tried an SSRI but she didnt like the way she felt and had to "beat her arms".    She has had two DMSO instillations.     Past Medical History Problems  1. History of  Anxiety (Symptom) 300.00 2. History of  No Medical Problems  Surgical History Problems  1. History of  Cystoscopy With Biopsy 2. History of  Cystoscopy With Dilation Of Bladder 3. History of  Dilation And Curettage  Current Meds 1. Advair Diskus 250-50 MCG/DOSE Inhalation Aerosol Powder Breath Activated; Therapy:  28Sep2012 to 2. ZyrTEC Allergy TABS; Therapy: (Recorded:24Jul2014) to  Allergies Medication  1. Amitriptyline HCl TABS 2. Elmiron CAPS 3. Tramadol  Family History Problems  1. Family history of  Family Health Status - Mother's Age early 72's Denied  2. Family history of  Family Health Status Number Of Children  Social History Problems  1. Former Smoker V15.82 2. Physical Disability: 3. Unemployed  Review of Systems  Respiratory: shortness of breath,  cough and shortness of breath during exertion.    Vitals Vital Signs [Data Includes: Last 1 Day]  07Aug2014 12:58PM  Blood Pressure: 115 / 69 Temperature: 98.9 F Heart Rate: 85  Physical Exam Constitutional: Well nourished and well developed . No acute distress.  Pulmonary: No respiratory distress and normal respiratory rhythm and effort.  Cardiovascular: Heart rate and rhythm are normal . No peripheral edema.  Neuro/Psych:. Mood and affect are appropriate.    Results/Data Urine [Data Includes: Last 1 Day]   07Aug2014  COLOR YELLOW   APPEARANCE CLEAR   SPECIFIC GRAVITY 1.020   pH 6.0   GLUCOSE NEG mg/dL  BILIRUBIN NEG   KETONE NEG mg/dL  BLOOD TRACE   PROTEIN NEG mg/dL  UROBILINOGEN 1 mg/dL  NITRITE NEG   LEUKOCYTE ESTERASE SMALL   SQUAMOUS EPITHELIAL/HPF MODERATE   WBC 3-6 WBC/hpf  RBC 0-2 RBC/hpf  BACTERIA MODERATE   CRYSTALS NONE SEEN   CASTS NONE SEEN   Other MUCUS    Assessment Assessed  1. Chronic Interstitial Cystitis 595.1 2. Urinary Frequency 788.41  Plan Health Maintenance (V70.0)  1. UA With REFLEX  Done: 07Aug2014 12:50PM Urinary Frequency (788.41)  2. Follow-up Schedule Surgery Office  Follow-up  Requested for: 07Aug2014  Discussion/Summary     Discussed with patient nature, R/B of continue with DMSO instillation, repeat Cysto/HD/EUA or referral to [facility] to see Dr. Logan Bores. She elects to proceed with cysto/HD. Dicussed pain may stay same or worsen, bladder  perforation, infection, bleeding, among others.    We'll have her see pulmonologist for some SOB and increase use of her inhalers prior to anesthesia.       Signatures Electronically signed by : Jerilee Field, M.D.; Aug 02 2013  2:03PM   UPDATE - treated with Cipro poss UTI. Repeat UA pre-op.

## 2013-08-22 NOTE — Progress Notes (Addendum)
NPO AFTER MN. ARRIVES AT 0745. NEEDS HG AND URINE PREG. AND UA. WILL DO DULERA INHALER W/ SIPS OF WATER AM OF SURG. AND BRING PROAIR INHALER.  CURRENT CXR IN EPIC AND CHART. PT HAS PULMONARY CLEARANCE BY DR Sherene Sires IN EPIC AND CHART.

## 2013-08-24 ENCOUNTER — Encounter (HOSPITAL_BASED_OUTPATIENT_CLINIC_OR_DEPARTMENT_OTHER): Payer: Self-pay | Admitting: Anesthesiology

## 2013-08-24 ENCOUNTER — Ambulatory Visit (HOSPITAL_BASED_OUTPATIENT_CLINIC_OR_DEPARTMENT_OTHER): Payer: Medicare Other | Admitting: Anesthesiology

## 2013-08-24 ENCOUNTER — Encounter (HOSPITAL_BASED_OUTPATIENT_CLINIC_OR_DEPARTMENT_OTHER)
Admission: RE | Disposition: A | Discharge status: Home / Self Care | Payer: Self-pay | Source: Ambulatory Visit | Attending: Urology

## 2013-08-24 ENCOUNTER — Ambulatory Visit (HOSPITAL_BASED_OUTPATIENT_CLINIC_OR_DEPARTMENT_OTHER)
Admission: RE | Admit: 2013-08-24 | Discharge: 2013-08-24 | Disposition: A | Discharge status: Home / Self Care | Payer: Medicare Other | Source: Ambulatory Visit | Attending: Urology | Admitting: Urology

## 2013-08-24 DIAGNOSIS — N949 Unspecified condition associated with female genital organs and menstrual cycle: Secondary | ICD-10-CM | POA: Insufficient documentation

## 2013-08-24 DIAGNOSIS — R350 Frequency of micturition: Secondary | ICD-10-CM | POA: Insufficient documentation

## 2013-08-24 DIAGNOSIS — F411 Generalized anxiety disorder: Secondary | ICD-10-CM | POA: Insufficient documentation

## 2013-08-24 DIAGNOSIS — R3915 Urgency of urination: Secondary | ICD-10-CM | POA: Insufficient documentation

## 2013-08-24 DIAGNOSIS — F17200 Nicotine dependence, unspecified, uncomplicated: Secondary | ICD-10-CM | POA: Insufficient documentation

## 2013-08-24 DIAGNOSIS — Z79899 Other long term (current) drug therapy: Secondary | ICD-10-CM | POA: Insufficient documentation

## 2013-08-24 DIAGNOSIS — Z888 Allergy status to other drugs, medicaments and biological substances status: Secondary | ICD-10-CM | POA: Insufficient documentation

## 2013-08-24 DIAGNOSIS — N3010 Interstitial cystitis (chronic) without hematuria: Secondary | ICD-10-CM | POA: Insufficient documentation

## 2013-08-24 HISTORY — DX: Personal history of other diseases of the nervous system and sense organs: Z86.69

## 2013-08-24 HISTORY — DX: Shortness of breath: R06.02

## 2013-08-24 HISTORY — DX: Personal history of other diseases of the digestive system: Z87.19

## 2013-08-24 HISTORY — PX: CYSTO WITH HYDRODISTENSION: SHX5453

## 2013-08-24 HISTORY — DX: Sarcoidosis of lung: D86.0

## 2013-08-24 HISTORY — DX: Unspecified asthma, uncomplicated: J45.909

## 2013-08-24 LAB — URINALYSIS, ROUTINE W REFLEX MICROSCOPIC
Glucose, UA: NEGATIVE mg/dL
Hgb urine dipstick: NEGATIVE
pH: 5.5 (ref 5.0–8.0)

## 2013-08-24 LAB — POCT PREGNANCY, URINE: Preg Test, Ur: NEGATIVE

## 2013-08-24 LAB — URINE MICROSCOPIC-ADD ON

## 2013-08-24 LAB — POCT HEMOGLOBIN-HEMACUE: Hemoglobin: 15.3 g/dL — ABNORMAL HIGH (ref 12.0–15.0)

## 2013-08-24 SURGERY — CYSTOSCOPY, WITH BLADDER HYDRODISTENSION
Anesthesia: General | Site: Bladder | Wound class: Clean Contaminated

## 2013-08-24 MED ORDER — FENTANYL CITRATE 0.05 MG/ML IJ SOLN
INTRAMUSCULAR | Status: DC | PRN
  Administered 2013-08-24: 25 ug via INTRAVENOUS
  Administered 2013-08-24: 50 ug via INTRAVENOUS

## 2013-08-24 MED ORDER — PHENAZOPYRIDINE HCL 200 MG PO TABS
ORAL | Status: DC | PRN
  Administered 2013-08-24: 09:00:00 via INTRAVESICAL

## 2013-08-24 MED ORDER — DEXAMETHASONE SODIUM PHOSPHATE 4 MG/ML IJ SOLN
INTRAMUSCULAR | Status: DC | PRN
  Administered 2013-08-24: 8 mg via INTRAVENOUS

## 2013-08-24 MED ORDER — URIBEL 118 MG PO CAPS: 1.0000 | ORAL_CAPSULE | Freq: Four times a day (QID) | ORAL | Status: DC | PRN

## 2013-08-24 MED ORDER — PROMETHAZINE HCL 25 MG/ML IJ SOLN
6.2500 mg | INTRAMUSCULAR | Status: DC | PRN
  Administered 2013-08-24: 6.25 mg via INTRAVENOUS
  Filled 2013-08-24: qty 1

## 2013-08-24 MED ORDER — FENTANYL CITRATE 0.05 MG/ML IJ SOLN
25.0000 ug | INTRAMUSCULAR | Status: DC | PRN
  Filled 2013-08-24: qty 1

## 2013-08-24 MED ORDER — LACTATED RINGERS IV SOLN
INTRAVENOUS | Status: DC
  Administered 2013-08-24: 08:00:00 via INTRAVENOUS
  Filled 2013-08-24: qty 1000

## 2013-08-24 MED ORDER — MELOXICAM 7.5 MG PO TABS: 7.5000 mg | ORAL_TABLET | Freq: Every day | ORAL | Status: DC

## 2013-08-24 MED ORDER — LIDOCAINE HCL (CARDIAC) 20 MG/ML IV SOLN
INTRAVENOUS | Status: DC | PRN
  Administered 2013-08-24: 50 mg via INTRAVENOUS

## 2013-08-24 MED ORDER — STERILE WATER FOR IRRIGATION IR SOLN
Status: DC | PRN
  Administered 2013-08-24: 3000 mL

## 2013-08-24 MED ORDER — CEFAZOLIN SODIUM-DEXTROSE 2-3 GM-% IV SOLR
2.0000 g | INTRAVENOUS | Status: AC
  Administered 2013-08-24: 2 g via INTRAVENOUS
  Filled 2013-08-24: qty 50

## 2013-08-24 MED ORDER — CEFAZOLIN SODIUM 1-5 GM-% IV SOLN
1.0000 g | INTRAVENOUS | Status: DC
  Filled 2013-08-24: qty 50

## 2013-08-24 MED ORDER — MIDAZOLAM HCL 5 MG/5ML IJ SOLN
INTRAMUSCULAR | Status: DC | PRN
  Administered 2013-08-24: 2 mg via INTRAVENOUS

## 2013-08-24 MED ORDER — BELLADONNA ALKALOIDS-OPIUM 16.2-60 MG RE SUPP
RECTAL | Status: DC | PRN
  Administered 2013-08-24: 1 via RECTAL

## 2013-08-24 MED ORDER — PROPOFOL 10 MG/ML IV BOLUS
INTRAVENOUS | Status: DC | PRN
  Administered 2013-08-24: 200 mg via INTRAVENOUS

## 2013-08-24 MED ORDER — ONDANSETRON HCL 4 MG/2ML IJ SOLN
INTRAMUSCULAR | Status: DC | PRN
  Administered 2013-08-24: 4 mg via INTRAVENOUS

## 2013-08-24 SURGICAL SUPPLY — 17 items
BAG DRAIN URO-CYSTO SKYTR STRL (DRAIN) ×2 IMPLANT
BAG DRN UROCATH (DRAIN) ×1
CANISTER SUCT LVC 12 LTR MEDI- (MISCELLANEOUS) ×2 IMPLANT
CATH ROBINSON RED A/P 16FR (CATHETERS) IMPLANT
CLOTH BEACON ORANGE TIMEOUT ST (SAFETY) ×2 IMPLANT
DRAPE CAMERA CLOSED 9X96 (DRAPES) ×2 IMPLANT
ELECT REM PT RETURN 9FT ADLT (ELECTROSURGICAL) ×2
ELECTRODE REM PT RTRN 9FT ADLT (ELECTROSURGICAL) ×1 IMPLANT
GLOVE BIO SURGEON STRL SZ7.5 (GLOVE) ×2 IMPLANT
GLOVE BIOGEL PI IND STRL 7.5 (GLOVE) ×2 IMPLANT
GLOVE BIOGEL PI INDICATOR 7.5 (GLOVE) ×2
GOWN STRL NON-REIN LRG LVL3 (GOWN DISPOSABLE) ×2 IMPLANT
GOWN STRL REIN XL XLG (GOWN DISPOSABLE) ×2 IMPLANT
NEEDLE HYPO 22GX1.5 SAFETY (NEEDLE) IMPLANT
NS IRRIG 500ML POUR BTL (IV SOLUTION) IMPLANT
PACK CYSTOSCOPY (CUSTOM PROCEDURE TRAY) ×2 IMPLANT
WATER STERILE IRR 3000ML UROMA (IV SOLUTION) ×2 IMPLANT

## 2013-08-24 NOTE — Transfer of Care (Signed)
Immediate Anesthesia Transfer of Care Note  Patient: Rhonda Hester  Procedure(s) Performed: Procedure(s) (LRB): CYSTOSCOPY/HYDRODISTENSION with instillation of marcaine and pyridium (N/A)  Patient Location: PACU  Anesthesia Type: General  Level of Consciousness: awake, oriented, sedated and patient cooperative  Airway & Oxygen Therapy: Patient Spontanous Breathing and Patient connected to face mask oxygen  Post-op Assessment: Report given to PACU RN and Post -op Vital signs reviewed and stable  Post vital signs: Reviewed and stable  Complications: No apparent anesthesia complications

## 2013-08-24 NOTE — Interval H&P Note (Signed)
History and Physical Interval Note:  08/24/2013 8:26 AM  Rhonda Hester  has presented today for surgery, with the diagnosis of Interstitial Cystitis  The various methods of treatment have been discussed with the patient and family. After consideration of risks, benefits and other options for treatment, the patient has consented to  Procedure(s): CYSTOSCOPY/HYDRODISTENSION (N/A) as a surgical intervention .  The patient's history has been reviewed, patient examined, no change in status, stable for surgery.  I have reviewed the patient's chart and labs.  Questions were answered to the patient's satisfaction.  U/a looks clear today. I discussed with the patient the nature, potential benefits, risks and alternatives to hydrodistention, including side effects of the proposed treatment, the likelihood of the patient achieving the goals of the procedure, and any potential problems that might occur during the procedure or recuperation. She has no fever or dysuria. She elects to proceed. She saw Dr. Sherene Sires pre-op. I reviewed his notes. I also counseled the patient on the need to stop smoking and the risks of bladder and renal cancers from smoking.     [name]

## 2013-08-24 NOTE — Anesthesia Postprocedure Evaluation (Signed)
  Anesthesia Post-op Note  Patient: Rhonda Hester  Procedure(s) Performed: Procedure(s) (LRB): CYSTOSCOPY/HYDRODISTENSION with instillation of marcaine and pyridium (N/A)  Patient Location: PACU  Anesthesia Type: General  Level of Consciousness: awake and alert   Airway and Oxygen Therapy: Patient Spontanous Breathing  Post-op Pain: mild  Post-op Assessment: Post-op Vital signs reviewed, Patient's Cardiovascular Status Stable, Respiratory Function Stable, Patent Airway and No signs of Nausea or vomiting  Last Vitals:  Filed Vitals:   08/24/13 1041  BP:   Pulse: 51  Temp:   Resp: 13    Post-op Vital Signs: stable   Complications: No apparent anesthesia complications

## 2013-08-24 NOTE — Anesthesia Procedure Notes (Signed)
Procedure Name: LMA Insertion Date/Time: 08/24/2013 8:54 AM Performed by: Renella Cunas D Pre-anesthesia Checklist: Patient identified, Emergency Drugs available, Suction available and Patient being monitored Patient Re-evaluated:Patient Re-evaluated prior to inductionOxygen Delivery Method: Circle System Utilized Preoxygenation: Pre-oxygenation with 100% oxygen Intubation Type: IV induction Ventilation: Mask ventilation without difficulty LMA: LMA inserted LMA Size: 4.0 Number of attempts: 1 Airway Equipment and Method: bite block Placement Confirmation: positive ETCO2 Tube secured with: Tape Dental Injury: Teeth and Oropharynx as per pre-operative assessment

## 2013-08-24 NOTE — Op Note (Signed)
Preoperative diagnosis: Pelvic pain, frequency, urgency Postoperative diagnosis: Same  Procedure: Exam under anesthesia Cystoscopy with low pressure hydrodistention  Surgeon: Mena Goes Anesthesia: Denenny  Type of anesthesia: Gen.  Findings: Exam under anesthesia the abdomen and pelvis were palpably normal without mass. On bimanual exam the bladder, urethra and the surrounding areas were palpably normal without mass. There was good support and no prolapse. On rectal exam there were no masses. A B&O suppository was placed.  On cystoscopy the bladder was easy to examine and showed a normal urethra. The trigone and ureteral orifice were normal with clear reflux bilaterally. The bladder mucosa was normal without lesion, tumor or erythema. There were two pale areas in the posterior bladder consistent with prior biopsies. There were no foreign bodies or stone in the bladder. Following hydrodistention there were diffuse petechiae and submucosal hemorrhage. Hemostasis was excellent.  bladder capacity 500 cc.   Description of procedure: After consent was obtained patient was brought to the operating room. After adequate anesthesia she was placed in lithotomy position and prepped and draped in the usual sterile fashion. A timeout was performed to confirm the patient and procedure. An exam under anesthesia was performed in a B&O suppository placed. Gloves and gowns changed was performed and and a cystoscope was passed per urethra. The bladder was inspected and then distended and 75 cm water pressure. Bladder capacity was 500 cc and the bladder was immediately drained. The bladder was distended again and left distended for 2 minutes. The bladder was drained. Hemostasis was excellent. Operating room and Marcaine were then injected and the scope removed. She was awakened and taken to the recovery room in stable condition.   Specimens: None  Blood loss: Minimal  Drains: None  Complications:

## 2013-08-24 NOTE — Anesthesia Preprocedure Evaluation (Addendum)
Anesthesia Evaluation  Patient identified by MRN, date of birth, ID band Patient awake    Reviewed: Allergy & Precautions, H&P , NPO status , Patient's Chart, lab work & pertinent test results  Airway Mallampati: II TM Distance: >3 FB Neck ROM: Full    Dental no notable dental hx.    Pulmonary neg pulmonary ROS, shortness of breath and lying, asthma , Current Smoker,  Pulmonary clearance Dr. Sherene Sires 08-20-13 breath sounds clear to auscultation  Pulmonary exam normal       Cardiovascular negative cardio ROS  Rhythm:Regular Rate:Normal     Neuro/Psych negative neurological ROS  negative psych ROS   GI/Hepatic negative GI ROS, Neg liver ROS,   Endo/Other  negative endocrine ROS  Renal/GU negative Renal ROS  negative genitourinary   Musculoskeletal negative musculoskeletal ROS (+)   Abdominal   Peds negative pediatric ROS (+)  Hematology negative hematology ROS (+)   Anesthesia Other Findings   Reproductive/Obstetrics negative OB ROS                          Anesthesia Physical Anesthesia Plan  ASA: II  Anesthesia Plan: General   Post-op Pain Management:    Induction: Intravenous  Airway Management Planned: LMA  Additional Equipment:   Intra-op Plan:   Post-operative Plan: Extubation in OR  Informed Consent: I have reviewed the patients History and Physical, chart, labs and discussed the procedure including the risks, benefits and alternatives for the proposed anesthesia with the patient or authorized representative who has indicated his/her understanding and acceptance.   Dental advisory given  Plan Discussed with: CRNA  Anesthesia Plan Comments:         Anesthesia Quick Evaluation

## 2013-08-28 ENCOUNTER — Encounter (HOSPITAL_BASED_OUTPATIENT_CLINIC_OR_DEPARTMENT_OTHER): Payer: Self-pay | Admitting: Urology

## 2013-09-25 ENCOUNTER — Encounter: Payer: Self-pay | Admitting: Internal Medicine

## 2013-09-25 ENCOUNTER — Ambulatory Visit (INDEPENDENT_AMBULATORY_CARE_PROVIDER_SITE_OTHER): Payer: Medicare Other | Admitting: Internal Medicine

## 2013-09-25 VITALS — BP 130/80 | HR 78 | Temp 98.8°F | Ht 67.0 in | Wt 170.0 lb

## 2013-09-25 DIAGNOSIS — D869 Sarcoidosis, unspecified: Secondary | ICD-10-CM

## 2013-09-25 DIAGNOSIS — J45909 Unspecified asthma, uncomplicated: Secondary | ICD-10-CM

## 2013-09-25 LAB — PULMONARY FUNCTION TEST

## 2013-09-25 MED ORDER — MOMETASONE FURO-FORMOTEROL FUM 200-5 MCG/ACT IN AERO: 2.0000 | INHALATION_SPRAY | Freq: Two times a day (BID) | RESPIRATORY_TRACT | Status: DC

## 2013-09-25 NOTE — Patient Instructions (Addendum)
Ok to stop dulera to see if it makes any difference and if worse restart  Please schedule a follow up office visit in 6 weeks, call sooner if needed with Dr Craige Cotta

## 2013-09-25 NOTE — Progress Notes (Signed)
PFT done today. 

## 2013-09-25 NOTE — Progress Notes (Signed)
Subjective:    Patient ID: Rhonda Hester, female    DOB: 11-07-1976    MRN: [id number]  Brief patient profile:  36 yobf  with Asthma, Allergic rhinitis and Sarcoidosis followed by Dr Craige Cotta    HPI 08/06/2013  preop consultation/Lorene Klimas active smoking  Re clearance for bladder surgery  Chief Complaint  Patient presents with  . Follow-up    Pt needs surgery clearance. She reports having increased SOB for the past 2 months. She also c/o sore throat- symptoms seem worse in the am.   Totally confused re meds, using "pump" bid but  not really helping and having trouble sleeping. Doe x > slow adls and sleeping poorly due to sob flat rec  Start dulera 200 Take 2 puffs first thing in am and then another 2 puffs about 12 hours later.  Work on inhaler technique:  Only use your albuterol as rescie   08/20/2013 f/u ov/Ashrith Sagan on dulera 200 bid maint/  Chief Complaint  Patient presents with  . Follow-up    Pt states breathing has improved some since the last visit, but still c/o feeling SOB when she lies down at night.   nasal obst worse at hs but breathing o/w better on dulera   Last took prednisone 2014  For breathing and coughing and stuffy nose - prev rx for nasal steroids but insurance would not cover.  No limiting sob  and no need for saba  rec Continue dulera 200 Take 2 puffs first thing in am and then another 2 puffs about 12 hours later.  Work on inhaler technique:   You are cleared for surgery    09/25/2013 f/u ov/Violette Morneault re: sob  Chief Complaint  Patient presents with  . Follow-up    PFT done today.  Increased SOB and congestionx3 days  has sense of nasal and chest congestion, only using the dulera now bid and no saba     No obvious daytime variabilty or assoc chronic cough or cp or chest tightness, subjective wheeze overt   hb symptoms. No unusual exp hx or h/o childhood pna/ asthma or knowledge of premature birth.   Also denies any obvious fluctuation of symptoms with weather or  environmental changes or other aggravating or alleviating factors except as outlined above   Current Medications, Allergies, Past Medical History, Past Surgical History, Family History, and Social History were reviewed in [employer] record.  ROS  The following are not active complaints unless bolded sore throat, dysphagia, dental problems, itching, sneezing,  nasal congestion or excess/ purulent secretions, ear ache,   fever, chills, sweats, unintended wt loss, pleuritic or exertional cp, hemoptysis,  orthopnea pnd or leg swelling, presyncope, palpitations, heartburn, abdominal pain, anorexia, nausea, vomiting, diarrhea  or change in bowel or urinary habits, change in stools or urine, dysuria,hematuria,  rash, arthralgias, visual complaints, headache, numbness weakness or ataxia or problems with walking or coordination,  change in mood/affect or memory.        Objective:   Physical Exam   08/20/2013       171  > 09/25/2013  170  Wt Readings from Last 3 Encounters:  08/06/13 172 lb (78.019 kg)  11/01/12 150 lb (68.04 kg)  09/02/11 200 lb (90.719 kg)      General - healthy amb bf nad HEENT - PERRLA, EOMI, clear nasal discharge, no sinus tenderness, no oral exudate, no LAN, no thyromegaly  Cardiac - s1s2 regular, no murmur, pulses symmetric  Chest - completely clear bilaterally ,  no rales/dullness  Abd - soft, non-tender, no organomegaly  Ext - no e/c/c  Neuro - normal strength, CN intact  Psych - normal mood, behavior  Skin - no rashes     CXR  08/06/2013 :  Pulmonary parenchymal findings compatible with chronic sarcoidosis. No definite acute superimposed abnormality is identified.     Assessment & Plan:

## 2013-10-11 ENCOUNTER — Encounter: Payer: Self-pay | Admitting: Internal Medicine

## 2013-11-19 ENCOUNTER — Ambulatory Visit (INDEPENDENT_AMBULATORY_CARE_PROVIDER_SITE_OTHER): Payer: Medicare Other | Admitting: Pulmonary Disease

## 2013-11-19 ENCOUNTER — Encounter: Payer: Self-pay | Admitting: Pulmonary Disease

## 2013-11-19 VITALS — BP 100/72 | HR 74 | Ht 67.0 in | Wt 175.0 lb

## 2013-11-19 DIAGNOSIS — D860 Sarcoidosis of lung: Secondary | ICD-10-CM

## 2013-11-19 DIAGNOSIS — J45909 Unspecified asthma, uncomplicated: Secondary | ICD-10-CM

## 2013-11-19 DIAGNOSIS — D869 Sarcoidosis, unspecified: Secondary | ICD-10-CM

## 2013-11-19 DIAGNOSIS — R091 Pleurisy: Secondary | ICD-10-CM

## 2013-11-19 DIAGNOSIS — J99 Respiratory disorders in diseases classified elsewhere: Secondary | ICD-10-CM

## 2013-11-19 DIAGNOSIS — N644 Mastodynia: Secondary | ICD-10-CM | POA: Insufficient documentation

## 2013-11-19 DIAGNOSIS — F17200 Nicotine dependence, unspecified, uncomplicated: Secondary | ICD-10-CM

## 2013-11-19 MED ORDER — PREDNISONE 10 MG PO TABS: ORAL_TABLET | ORAL | Status: DC

## 2013-11-19 MED ORDER — ALBUTEROL SULFATE HFA 108 (90 BASE) MCG/ACT IN AERS: 2.0000 | INHALATION_SPRAY | Freq: Four times a day (QID) | RESPIRATORY_TRACT | Status: DC | PRN

## 2013-11-19 NOTE — Assessment & Plan Note (Signed)
She is to continue dulera and prn proair.

## 2013-11-19 NOTE — Progress Notes (Signed)
Chief Complaint  Patient presents with  . Asthma    Breathing has gotten worse. Reports chest pain (L breast), SOB. Also has a scab on her L knee that won't heal.    History of Present Illness: Rhonda Hester is a 37 y.o. female former smoker with asthma, allergic rhinitis, and sarcoidosis (pulmonary and skin involvement).  I last saw her in September 2012.  She has been getting pleuritic pain on the left.  She restarted dulera, and this helps some.  She still has cough, wheeze, and clear sputum.  She is not having fever or joint swelling.  She wakes up at night feeling like she can't breath and feels anxious.  She has a lesion on her left knee that hasn't resolved for 9 months >> she has been using triamcinolone cream.  She has not been on prednisone or antibiotics recently.  Her last chest xray was in August 2014.  TESTS: CT chest 12/30/04 >> b/l hilar and mediastinal adenopathy with b/l nodular interstitial opacities CT chest 07/08/08 >> decreased lymph nodes, scarring with volume loss RAST 07/22/11 >> Multiple allergens, IgE 71.8 PFT 09/02/11 >> FEV1 2.80(93%), FEV1% 82, TLC 4.74(89%), DLCO 59%, DLCO/VA 95%, no BD response. PFT 09/25/13 >> FEV1 2.75 (95%), FEV1% 81, TLC 4.25 (77%), DLCO 64%, no BD response.  Rhonda Hester  has a past medical history of Seasonal allergic rhinitis; Eczema; Interstitial cystitis; Allergic asthma; Sarcoidosis of lung; Shortness of breath; History of Bell's palsy; History of anal fissures; and History of gastroesophageal reflux (GERD).  Rhonda Hester  has past surgical history that includes Dilation and curettage of uterus (1997); CYSTOSCOPY/ HYDRODISTENTION/ BLADDER BX (06-09-2010); and cysto with hydrodistension (N/A, 08/24/2013).  Prior to Admission medications   Medication Sig Start Date End Date Taking? Authorizing Provider  albuterol (PROAIR HFA) 108 (90 BASE) MCG/ACT inhaler Inhale 2 puffs into the lungs every 6 (six) hours as needed for  shortness of breath.   Yes Historical Provider, MD  mometasone-formoterol (DULERA) 200-5 MCG/ACT AERO Inhale 2 puffs into the lungs 2 (two) times daily. Take 2 puffs first thing in am and then another 2 puffs about 12 hours later. 09/25/13  Yes Nyoka Cowden, MD    Allergies  Allergen Reactions  . Citric Acid Other (See Comments)    AVOIDS DUE TO INTERSTITIAL CYSTITIS  . Dust Mite Extract Itching and Other (See Comments)     coughing  . Pollen Extract Itching and Other (See Comments)     coughing  . Nickel Rash     Physical Exam:  General - No distress ENT - No sinus tenderness, MP 4, no oral exudate, no LAN Cardiac - s1s2 regular, no murmur Chest - scattered rhonchi, no wheeze, limited chest excursion with inspiration due to discomfort Back - No focal tenderness Abd - Soft, non-tender Ext - No edema Neuro - Normal strength Skin - slightly raised, brown/black, 1 cm, non-tender lesion medial aspect Lt knee Psych - normal mood, and behavior   Assessment/Plan:  Coralyn Helling, MD [facility] Pager:  [phone]

## 2013-11-19 NOTE — Assessment & Plan Note (Signed)
Explained importance of complete smoking cessation, and offered assistance with this.       

## 2013-11-19 NOTE — Assessment & Plan Note (Signed)
She has hx of sarcoidosis with pulmonary and skin involvement.  Her Lt leg lesion likely is related to sarcoid >> will give course of prednisone; may need referral to dermatology.  Will further assess status of pulmonary disease with CT chest.

## 2013-11-19 NOTE — Assessment & Plan Note (Signed)
She reports Lt pleuritic chest pain.  Will arrange for CT angiogram chest to further assess.

## 2013-11-19 NOTE — Patient Instructions (Signed)
Prednisone 10 mg pill >> 4 pills for 2 days, 3 pills for 2 days, 2 pills for 2 days, 1 pill for 2 days Continue dulera two puffs twice per day >> rinse mouth after each use Proair two puffs as needed up to four times per day for cough, wheeze, or chest congestion Will schedule CT chest >> will call with results Will arrange for referral to gynecology Follow up in 6 to 8 weeks

## 2013-11-19 NOTE — Assessment & Plan Note (Signed)
She reports intermittent breast pain.  She has not had annual pelvic exam is some time.  Will arrange for referral to Gyn.

## 2013-11-20 ENCOUNTER — Ambulatory Visit (INDEPENDENT_AMBULATORY_CARE_PROVIDER_SITE_OTHER)
Admission: RE | Admit: 2013-11-20 | Discharge: 2013-11-20 | Disposition: A | Discharge status: Home / Self Care | Payer: Medicare Other | Source: Ambulatory Visit | Attending: Pulmonary Disease | Admitting: Pulmonary Disease

## 2013-11-20 ENCOUNTER — Telehealth: Payer: Self-pay | Admitting: Pulmonary Disease

## 2013-11-20 DIAGNOSIS — D860 Sarcoidosis of lung: Secondary | ICD-10-CM

## 2013-11-20 DIAGNOSIS — R091 Pleurisy: Secondary | ICD-10-CM

## 2013-11-20 DIAGNOSIS — D869 Sarcoidosis, unspecified: Secondary | ICD-10-CM

## 2013-11-20 DIAGNOSIS — J99 Respiratory disorders in diseases classified elsewhere: Secondary | ICD-10-CM

## 2013-11-20 MED ORDER — IOHEXOL 350 MG/ML SOLN
80.0000 mL | Freq: Once | INTRAVENOUS | Status: AC | PRN
  Administered 2013-11-20: 80 mL via INTRAVENOUS

## 2013-11-20 NOTE — Telephone Encounter (Signed)
Advised pt that Dr. Craige Cotta needs to address results.  Dr. Craige Cotta please advise on CT.

## 2013-11-21 NOTE — Telephone Encounter (Signed)
11/20/2013    CLINICAL DATA:  Left pleuritic chest pain.  History of sarcoidosis   EXAM: CT ANGIOGRAPHY CHEST WITH CONTRAST   TECHNIQUE: Multidetector CT imaging of the chest was performed using the standard protocol during bolus administration of intravenous contrast. Multiplanar CT image reconstructions including MIPs were obtained to evaluate the vascular anatomy.   CONTRAST:  80mL OMNIPAQUE IOHEXOL 350 MG/ML SOLN   COMPARISON:  Chest x-ray August 06, 2013   FINDINGS:  There is no pulmonary embolus. The great vessels are normal. There is no mediastinal or hilar lymphadenopathy. The heart size is normal. There is no pericardial effusion. There are patchy consolidation in bilateral upper lobes, left lower lobe, right lower lobe. These are least impart due to scarring or atelectasis but underlying pneumonia is are not excluded. There is no pleural effusion. No acute abnormality is identified within the bones.  Review of the MIP images confirms the above findings.   IMPRESSION:  No pulmonary embolus. There are patchy consolidation in bilateral upper lobes, left lower lobe, right lower lobe. These are least impart due to scarring or atelectasis but underlying pneumonia is are not excluded.    Electronically Signed   By: Sherian Rein M.D.   On: 11/20/2013 16:20    Will have my nurse inform pt that CT chest shows changes from sarcoidosis.  No other worrisome findings.  She is to continue with course or prednisone.  Will discuss in more detail at next ROV.

## 2013-11-21 NOTE — Telephone Encounter (Signed)
Pt advised. Levonte Molina, CMA  

## 2013-11-21 NOTE — Telephone Encounter (Signed)
[id number] x1 

## 2014-01-01 ENCOUNTER — Ambulatory Visit: Payer: Medicare Other | Admitting: Pulmonary Disease

## 2014-01-10 ENCOUNTER — Emergency Department (INDEPENDENT_AMBULATORY_CARE_PROVIDER_SITE_OTHER)
Admission: EM | Admit: 2014-01-10 | Discharge: 2014-01-10 | Disposition: A | Discharge status: Home / Self Care | Payer: Medicare Other | Source: Home / Self Care

## 2014-01-10 ENCOUNTER — Encounter (HOSPITAL_COMMUNITY): Payer: Self-pay | Admitting: Emergency Medicine

## 2014-01-10 DIAGNOSIS — R079 Chest pain, unspecified: Secondary | ICD-10-CM

## 2014-01-10 DIAGNOSIS — R0789 Other chest pain: Secondary | ICD-10-CM

## 2014-01-10 DIAGNOSIS — IMO0001 Reserved for inherently not codable concepts without codable children: Secondary | ICD-10-CM

## 2014-01-10 DIAGNOSIS — R071 Chest pain on breathing: Secondary | ICD-10-CM

## 2014-01-10 DIAGNOSIS — M609 Myositis, unspecified: Secondary | ICD-10-CM

## 2014-01-10 DIAGNOSIS — R0781 Pleurodynia: Secondary | ICD-10-CM

## 2014-01-10 MED ORDER — KETOROLAC TROMETHAMINE 60 MG/2ML IM SOLN
60.0000 mg | Freq: Once | INTRAMUSCULAR | Status: AC
Start: 2014-01-10 — End: 2014-01-10
  Administered 2014-01-10: 60 mg via INTRAMUSCULAR

## 2014-01-10 MED ORDER — KETOROLAC TROMETHAMINE 60 MG/2ML IM SOLN
INTRAMUSCULAR | Status: AC
  Filled 2014-01-10: qty 2

## 2014-01-10 MED ORDER — TRAMADOL HCL 50 MG PO TABS: 50.0000 mg | ORAL_TABLET | Freq: Four times a day (QID) | ORAL | Status: DC | PRN

## 2014-01-10 MED ORDER — DICLOFENAC POTASSIUM 50 MG PO TABS: 50.0000 mg | ORAL_TABLET | Freq: Three times a day (TID) | ORAL | Status: DC

## 2014-01-10 NOTE — Discharge Instructions (Signed)
Chest Wall Pain Chest wall pain is pain felt in or around the chest bones and muscles. It may take up to 6 weeks to get better. It may take longer if you are active. Chest wall pain can happen on its own. Other times, things like germs, injury, coughing, or exercise can cause the pain. HOME CARE   Avoid activities that make you tired or cause pain. Try not to use your chest, belly (abdominal), or side muscles. Do not use heavy weights.  Put ice on the sore area.  Put ice in a plastic bag.  Place a towel between your skin and the bag.  Leave the ice on for 15-20 minutes for the first 2 days.  Only take medicine as told by your doctor. GET HELP RIGHT AWAY IF:   You have more pain or are very uncomfortable.  You have a fever.  Your chest pain gets worse.  You have new problems.  You feel sick to your stomach (nauseous) or throw up (vomit).  You start to sweat or feel lightheaded.  You have a cough with mucus (phlegm).  You cough up blood. MAKE SURE YOU:   Understand these instructions.  Will watch your condition.  Will get help right away if you are not doing well or get worse. Document Released: 05/31/2008 Document Revised: 03/06/2012 Document Reviewed: 08/09/2011 [facility] Patient Information 2014 [place], [place].  Costochondritis Costochondritis is a condition in which the tissue (cartilage) that connects your ribs with your breastbone (sternum) becomes irritated. It causes pain in the chest and rib area. It usually goes away on its own over time. HOME CARE  Avoid activities that wear you out.  Do not strain your ribs. Avoid activities that use your:  Chest.  Belly.  Side muscles.  Put ice on the area for the first 2 days after the pain starts.  Put ice in a plastic bag.  Place a towel between your skin and the bag.  Leave the ice on for 20 minutes, 2 3 times a day.  Only take medicine as told by your doctor. GET HELP IF:  You have redness or  puffiness (swelling) in the rib area.  Your pain does not go away with rest or medicine. GET HELP RIGHT AWAY IF:   Your pain gets worse.  You are very uncomfortable.  You have trouble breathing.  You cough up blood.  You start sweating or throwing up (vomiting).  You have a fever or lasting symptoms for more than 2 3 days.  You have a fever and your symptoms suddenly get worse. MAKE SURE YOU:   Understand these instructions.  Will watch your condition.  Will get help right away if you are not doing well or get worse. Document Released: 05/31/2008 Document Revised: 08/15/2013 Document Reviewed: 07/17/2013 [facility] Patient Information 2014 [place].  Myalgia, Adult Myalgia is the medical term for muscle pain. It is a symptom of many things. Nearly everyone at some time in their life has this. The most common cause for muscle pain is overuse or straining and more so when you are not in shape. Injuries and muscle bruises cause myalgias. Muscle pain without a history of injury can also be caused by a virus. It frequently comes along with the flu. Myalgia not caused by muscle strain can be present in a large number of infectious diseases. Some autoimmune diseases like lupus and fibromyalgia can cause muscle pain. Myalgia may be mild, or severe. SYMPTOMS  The symptoms of myalgia  are simply muscle pain. Most of the time this is short lived and the pain goes away without treatment. DIAGNOSIS  Myalgia is diagnosed by your caregiver by taking your history. This means you tell him when the problems began, what they are, and what has been happening. If this has not been a long term problem, your caregiver may want to watch for a while to see what will happen. If it has been long term, they may want to do additional testing. TREATMENT  The treatment depends on what the underlying cause of the muscle pain is. Often anti-inflammatory medications will help. HOME CARE INSTRUCTIONS  If the  pain in your muscles came from overuse, slow down your activities until the problems go away.  Myalgia from overuse of a muscle can be treated with alternating hot and cold packs on the muscle affected or with cold for the first couple days. If either heat or cold seems to make things worse, stop their use.  Apply ice to the sore area for 15-20 minutes, 03-04 times per day, while awake for the first 2 days of muscle soreness, or as directed. Put the ice in a plastic bag and place a towel between the bag of ice and your skin.  Only take over-the-counter or prescription medicines for pain, discomfort, or fever as directed by your caregiver.  Regular gentle exercise may help if you are not active.  Stretching before strenuous exercise can help lower the risk of myalgia. It is normal when beginning an exercise regimen to feel some muscle pain after exercising. Muscles that have not been used frequently will be sore at first. If the pain is extreme, this may mean injury to a muscle. SEEK MEDICAL CARE IF:  You have an increase in muscle pain that is not relieved with medication.  You begin to run a temperature.  You develop nausea and vomiting.  You develop a stiff and painful neck.  You develop a rash.  You develop muscle pain after a tick bite.  You have continued muscle pain while working out even after you are in good condition. SEEK IMMEDIATE MEDICAL CARE IF: Any of your problems are getting worse and medications are not helping. MAKE SURE YOU:   Understand these instructions.  Will watch your condition.  Will get help right away if you are not doing well or get worse. Document Released: 11/04/2006 Document Revised: 03/06/2012 Document Reviewed: 01/24/2007 [facility] Patient Information 2014 [place], [place].  Myofascial Pain Syndrome  Myofascial pain (MP) is one of the most common causes of discomfort. This pain may be felt in the muscles. It may come and go. MP always has trigger  or tender points in the muscle that will cause pain when pressed. HOME CARE   Learn to do gentle exercise. Stretch.  Change your position every hour.  Eat a healthy diet.  Find ways to relax and lessen stress.  Avoid things that make your pain worse.  Get a massage by someone trained to release trigger or tender points.  Only take medicine as told by your doctor.  Get follow-up care as needed. GET HELP RIGHT AWAY IF:   Your pain suddenly gets worse.  You are not able to move your arms or legs.  You have pain in your chest.  It is hard to breathe. MAKE SURE YOU:  Understand these instructions.  Will watch your condition.  Will get help right away if you are not doing well or get worse. Document Released: 11/25/2008  Document Revised: 03/06/2012 Document Reviewed: 11/25/2008 [facility] Patient Information 2014 [place].

## 2014-01-10 NOTE — ED Notes (Signed)
Pt  Reports   l  Shoulder  Pain      With  Pain   Radiating  To  l  Side   Of  Chest     Upwards  To  Neck  With  The  Symptoms  X    3  Days        Pt   Reports  Pain  Worse  On  Movement    Pt  denys  Any    Known  Injury

## 2014-01-10 NOTE — ED Provider Notes (Signed)
CSN: [id number]     Arrival date & time 01/10/14  1133 History   None    Chief Complaint  Patient presents with  . Shoulder Pain   (Consider location/radiation/quality/duration/timing/severity/associated sxs/prior Treatment) HPI Comments: 38 year old female complaining of left anterolateral chest wall pain that radiates toward the proximal shoulder. She is unaware of trauma, repetitive injury or other reason for the pain. It started 2 days ago. Pain is located from the mid scapular line to the anterior axillary line. She states it hurts to move or take deep breaths.   Past Medical History  Diagnosis Date  . Seasonal allergic rhinitis   . Eczema   . Interstitial cystitis   . Allergic asthma   . Sarcoidosis of lung     DX 2006--  PULMOLOGIST--  DR LZJQ  . Shortness of breath   . History of Bell's palsy     2006  RIGHT SIDE--  RESOLVED  . History of anal fissures   . History of gastroesophageal reflux (GERD)    Past Surgical History  Procedure Laterality Date  . Dilation and curettage of uterus  1997  . Cystoscopy/ hydrodistention/ bladder bx  06-09-2010  . Cysto with hydrodistension N/A 08/24/2013    Procedure: CYSTOSCOPY/HYDRODISTENSION with instillation of marcaine and pyridium;  Surgeon: Fredricka Bonine, MD;  Location: [facility];  Service: Urology;  Laterality: N/A;   Family History  Problem Relation Age of Onset  . Brain cancer Maternal Grandmother    History  Substance Use Topics  . Smoking status: Current Some Day Smoker -- 0.30 packs/day for 6 years    Types: Cigarettes  . Smokeless tobacco: Never Used     Comment: ONE CIG. DAILY /  1 PPMONTH  . Alcohol Use: No   OB History   Grav Para Term Preterm Abortions TAB SAB Ect Mult Living   1 0 0 0 1 0 1 0 0 0      Review of Systems  Constitutional: Negative for fever, chills and activity change.  HENT: Negative.   Respiratory: Negative.   Cardiovascular: Negative.   Musculoskeletal: Negative  for joint swelling.       As per HPI  Skin: Negative for color change, pallor and rash.  Neurological: Negative.     Allergies  Citric acid; Dust mite extract; Pollen extract; and Nickel  Home Medications   Current Outpatient Rx  Name  Route  Sig  Dispense  Refill  . albuterol (PROAIR HFA) 108 (90 BASE) MCG/ACT inhaler   Inhalation   Inhale 2 puffs into the lungs every 6 (six) hours as needed for shortness of breath.   1 Inhaler   5   . diclofenac (CATAFLAM) 50 MG tablet   Oral   Take 1 tablet (50 mg total) by mouth 3 (three) times daily.   21 tablet   0   . mometasone-formoterol (DULERA) 200-5 MCG/ACT AERO   Inhalation   Inhale 2 puffs into the lungs 2 (two) times daily. Take 2 puffs first thing in am and then another 2 puffs about 12 hours later.   1 Inhaler   11   . traMADol (ULTRAM) 50 MG tablet   Oral   Take 1 tablet (50 mg total) by mouth every 6 (six) hours as needed.   20 tablet   0    BP 123/67  Pulse 75  Temp(Src) 98.7 F (37.1 C) (Oral)  Resp 20  SpO2 100%  LMP 12/23/2013 Physical Exam  Nursing note and  vitals reviewed. Constitutional: She is oriented to person, place, and time. She appears well-developed and well-nourished. No distress.  HENT:  Head: Normocephalic and atraumatic.  Eyes: EOM are normal.  Neck: Normal range of motion. Neck supple.  Cardiovascular: Normal rate and normal heart sounds.   Pulmonary/Chest: Effort normal and breath sounds normal. No respiratory distress. She has no wheezes. She has no rales.  Musculoskeletal: She exhibits tenderness. She exhibits no edema.  Tenderness to the left anterior chest wall and ribs from the third and fourth rib to the lower costal margin. Pain is exacerbated with arm movement and taking a deep breath. There is also tenderness in the region of the scapula as well as the insertion point along the left side of the neck. No obvious swelling or deformity.  Neurological: She is alert and oriented to  person, place, and time. No cranial nerve deficit.  Skin: Skin is warm and dry.  Psychiatric: She has a normal mood and affect.    ED Course  Procedures (including critical care time) Labs Review Labs Reviewed - No data to display Imaging Review No results found.    MDM   1. Myofasciitis   2. Chest wall pain   3. Rib pain on left side      Reassurance. Apply ice to the sore areas off and on. Instructions on the diagnoses Cataflam 50 mg 3 times a day with food when necessary Tramadol 50 mg every 6 hours when necessary pain Limited use of left arm that exacerbates pain.    Janne Napoleon, NP 01/10/14 [place], NP 01/12/14 1455

## 2014-01-15 NOTE — ED Provider Notes (Signed)
Medical screening examination/treatment/procedure(s) were performed by a resident physician or non-physician practitioner and as the supervising physician I was immediately available for consultation/collaboration.  Nachelle Negrette, MD    Cadynce Garrette S Rahkeem Senft, MD 01/15/14 0749 

## 2014-01-20 ENCOUNTER — Encounter (HOSPITAL_COMMUNITY): Payer: Self-pay | Admitting: Emergency Medicine

## 2014-01-20 ENCOUNTER — Emergency Department (HOSPITAL_COMMUNITY)
Admission: EM | Admit: 2014-01-20 | Discharge: 2014-01-20 | Disposition: A | Discharge status: Home / Self Care | Payer: Medicare Other | Attending: Emergency Medicine | Admitting: Emergency Medicine

## 2014-01-20 ENCOUNTER — Emergency Department (HOSPITAL_COMMUNITY): Payer: Medicare Other

## 2014-01-20 DIAGNOSIS — R0602 Shortness of breath: Secondary | ICD-10-CM | POA: Insufficient documentation

## 2014-01-20 DIAGNOSIS — M62838 Other muscle spasm: Secondary | ICD-10-CM | POA: Insufficient documentation

## 2014-01-20 DIAGNOSIS — Z8719 Personal history of other diseases of the digestive system: Secondary | ICD-10-CM | POA: Insufficient documentation

## 2014-01-20 DIAGNOSIS — Z79899 Other long term (current) drug therapy: Secondary | ICD-10-CM | POA: Insufficient documentation

## 2014-01-20 DIAGNOSIS — F1211 Cannabis abuse, in remission: Secondary | ICD-10-CM | POA: Insufficient documentation

## 2014-01-20 DIAGNOSIS — Z8669 Personal history of other diseases of the nervous system and sense organs: Secondary | ICD-10-CM | POA: Insufficient documentation

## 2014-01-20 DIAGNOSIS — F17200 Nicotine dependence, unspecified, uncomplicated: Secondary | ICD-10-CM | POA: Insufficient documentation

## 2014-01-20 DIAGNOSIS — J3089 Other allergic rhinitis: Secondary | ICD-10-CM | POA: Insufficient documentation

## 2014-01-20 DIAGNOSIS — J45909 Unspecified asthma, uncomplicated: Secondary | ICD-10-CM | POA: Insufficient documentation

## 2014-01-20 DIAGNOSIS — D869 Sarcoidosis, unspecified: Secondary | ICD-10-CM | POA: Insufficient documentation

## 2014-01-20 DIAGNOSIS — J301 Allergic rhinitis due to pollen: Secondary | ICD-10-CM | POA: Insufficient documentation

## 2014-01-20 DIAGNOSIS — L259 Unspecified contact dermatitis, unspecified cause: Secondary | ICD-10-CM | POA: Insufficient documentation

## 2014-01-20 DIAGNOSIS — N3090 Cystitis, unspecified without hematuria: Secondary | ICD-10-CM | POA: Insufficient documentation

## 2014-01-20 DIAGNOSIS — J99 Respiratory disorders in diseases classified elsewhere: Secondary | ICD-10-CM | POA: Insufficient documentation

## 2014-01-20 DIAGNOSIS — R079 Chest pain, unspecified: Secondary | ICD-10-CM | POA: Insufficient documentation

## 2014-01-20 MED ORDER — NAPROXEN 375 MG PO TABS: 375.0000 mg | ORAL_TABLET | Freq: Two times a day (BID) | ORAL | Status: DC

## 2014-01-20 MED ORDER — DIAZEPAM 5 MG PO TABS
5.0000 mg | ORAL_TABLET | Freq: Once | ORAL | Status: AC
  Administered 2014-01-20: 5 mg via ORAL
  Filled 2014-01-20: qty 1

## 2014-01-20 MED ORDER — DIAZEPAM 5 MG PO TABS: 5.0000 mg | ORAL_TABLET | Freq: Three times a day (TID) | ORAL | Status: DC | PRN

## 2014-01-20 MED ORDER — KETOROLAC TROMETHAMINE 30 MG/ML IJ SOLN
30.0000 mg | Freq: Once | INTRAMUSCULAR | Status: AC
  Administered 2014-01-20: 30 mg via INTRAMUSCULAR
  Filled 2014-01-20: qty 1

## 2014-01-20 NOTE — ED Provider Notes (Signed)
CSN: [id number]     Arrival date & time 01/20/14  1746 History   First MD Initiated Contact with Patient 01/20/14 2047     Chief Complaint  Patient presents with  . Shoulder Pain   (Consider location/radiation/quality/duration/timing/severity/associated sxs/prior Treatment) HPI Comments: Patient presents with 2 weeks of left shoulder pain.  States she woke one morning with this pain.  It has been persistent since then.  She has seen urgent care, who diagnosed her with myositis , and gave her prescription for tramadol and Cataflam which has not decreased her discomfort at all She states the only relief.  She gets is sitting in a warm tub of water.  Denies any trauma.  She is right-handed.  She does not work at a position, that requires lifting or pushing or pulling  Patient is a 38 y.o. female presenting with shoulder pain. The history is provided by the patient.  Shoulder Pain This is a recurrent problem. The current episode started 1 to 4 weeks ago. The problem occurs constantly. The problem has been unchanged. Associated symptoms include arthralgias and chest pain. Pertinent negatives include no fever, joint swelling, numbness, rash or weakness. The symptoms are aggravated by exertion. She has tried NSAIDs, position changes, rest, heat and immobilization for the symptoms. The treatment provided no relief.    Past Medical History  Diagnosis Date  . Seasonal allergic rhinitis   . Eczema   . Interstitial cystitis   . Allergic asthma   . Sarcoidosis of lung     DX 2006--  PULMOLOGIST--  DR JJOA  . Shortness of breath   . History of Bell's palsy     2006  RIGHT SIDE--  RESOLVED  . History of anal fissures   . History of gastroesophageal reflux (GERD)    Past Surgical History  Procedure Laterality Date  . Dilation and curettage of uterus  1997  . Cystoscopy/ hydrodistention/ bladder bx  06-09-2010  . Cysto with hydrodistension N/A 08/24/2013    Procedure: CYSTOSCOPY/HYDRODISTENSION with  instillation of marcaine and pyridium;  Surgeon: Fredricka Bonine, MD;  Location: [facility];  Service: Urology;  Laterality: N/A;   Family History  Problem Relation Age of Onset  . Brain cancer Maternal Grandmother    History  Substance Use Topics  . Smoking status: Current Some Day Smoker -- 0.30 packs/day for 6 years    Types: Cigarettes  . Smokeless tobacco: Never Used     Comment: ONE CIG. DAILY /  1 PPMONTH  . Alcohol Use: No   OB History   Grav Para Term Preterm Abortions TAB SAB Ect Mult Living   1 0 0 0 1 0 1 0 0 0      Review of Systems  Constitutional: Negative for fever.  HENT: Negative for trouble swallowing.   Cardiovascular: Positive for chest pain.  Musculoskeletal: Positive for arthralgias. Negative for joint swelling.  Skin: Negative for rash.  Neurological: Negative for weakness and numbness.  All other systems reviewed and are negative.    Allergies  Citric acid; Dust mite extract; Pollen extract; and Nickel  Home Medications   Current Outpatient Rx  Name  Route  Sig  Dispense  Refill  . albuterol (PROAIR HFA) 108 (90 BASE) MCG/ACT inhaler   Inhalation   Inhale 2 puffs into the lungs every 6 (six) hours as needed for shortness of breath.   1 Inhaler   5   . diazepam (VALIUM) 5 MG tablet   Oral  Take 1 tablet (5 mg total) by mouth every 8 (eight) hours as needed for anxiety.   15 tablet   0   . diclofenac (CATAFLAM) 50 MG tablet   Oral   Take 1 tablet (50 mg total) by mouth 3 (three) times daily.   21 tablet   0   . mometasone-formoterol (DULERA) 200-5 MCG/ACT AERO   Inhalation   Inhale 2 puffs into the lungs 2 (two) times daily. Take 2 puffs first thing in am and then another 2 puffs about 12 hours later.   1 Inhaler   11   . naproxen (NAPROSYN) 375 MG tablet   Oral   Take 1 tablet (375 mg total) by mouth 2 (two) times daily with a meal.   30 tablet   0   . traMADol (ULTRAM) 50 MG tablet   Oral   Take 1  tablet (50 mg total) by mouth every 6 (six) hours as needed.   20 tablet   0    BP 133/72  Pulse 85  Temp(Src) 98 F (36.7 C) (Oral)  Resp 18  SpO2 97%  LMP 01/16/2014 Physical Exam  Nursing note and vitals reviewed. Constitutional: She is oriented to person, place, and time. She appears well-developed and well-nourished. No distress.  HENT:  Head: Normocephalic and atraumatic.  Right Ear: External ear normal.  Left Ear: External ear normal.  Mouth/Throat: Oropharynx is clear and moist.  Eyes: Pupils are equal, round, and reactive to light.  Neck: Normal range of motion. Muscular tenderness present. No spinous process tenderness present. No edema and normal range of motion present.    Patient has full range of motion of neck, no cervical spine spinous process tenderness  Cardiovascular: Normal rate.   Pulmonary/Chest: Effort normal. She exhibits no tenderness.  Abdominal: Soft.  Musculoskeletal: Normal range of motion.  Neurological: She is alert and oriented to person, place, and time.  Skin: Skin is warm and dry. No rash noted. No erythema.    ED Course  Procedures (including critical care time) Labs Review Labs Reviewed - No data to display Imaging Review Dg Chest 2 View  01/20/2014   CLINICAL DATA:  Chest pain, history of sarcoidosis  EXAM: CHEST  2 VIEW  COMPARISON:  CT ANGIO CHEST W/CM &/OR WO/CM dated 11/20/2013; DG CHEST 2 VIEW dated 08/06/2013  FINDINGS: Lungs are slightly better aerated than the similar prior exam. Bilateral hilar prominence is stable. Chronic perihilar and upper lobe curvilinear scarring with upward hilar retraction compatible with advanced sarcoidosis is reidentified. No new pulmonary parenchymal consolidation. No pneumothorax. No pleural effusion. Mild leftward curvature of the thoracic spine centered at T5 is noted.  IMPRESSION: Chronic changes of scarring related to sarcoidosis, no new acute finding.   Electronically Signed   By: Conchita Paris  M.D.   On: 01/20/2014 18:57   Dg Shoulder Left  01/20/2014   CLINICAL DATA:  Left shoulder pain, history of sarcoidosis  EXAM: LEFT SHOULDER - 2+ VIEW  COMPARISON:  Chest radiograph same date  FINDINGS: There is no evidence of fracture or dislocation. There is no evidence of arthropathy or other focal bone abnormality. Soft tissues are unremarkable. Left-sided upper lobe predominant pulmonary parenchymal scarring is partly visualized.  IMPRESSION: Negative.   Electronically Signed   By: Conchita Paris M.D.   On: 01/20/2014 18:59    EKG Interpretation   None       MDM   1. Muscle spasm     X-ray of chest  and shoulder.  Negative After Toradol and Valium.  And warm compresses Patient is feeling much better She'll be discharged him with Valium.  Continue taking the tramadol on a regular basis, Towel.  Also, add Naprosyn 325 twice a day for anti-inflammatory effects   Garald Balding, NP 01/20/14 2140

## 2014-01-20 NOTE — Discharge Instructions (Signed)
Heat Therapy Heat therapy can help make painful, stiff muscles and joints feel better. Do not use heat on new injuries. Wait at least 48 hours after an injury to use heat. Do not use heat when you have aches or pains right after an activity. If you still have pain 3 hours after stopping the activity, then you may use heat. HOME CARE Wet heat pack  Soak a clean towel in warm water. Squeeze out the extra water.  Put the warm, wet towel in a plastic bag.  Place a thin, dry towel between your skin and the bag.  Put the heat pack on the area for 5 minutes, and check your skin. Your skin may be pink, but it should not be red.  Leave the heat pack on the area for 15 to 30 minutes.  Repeat this every 2 to 4 hours while awake. Do not use heat while you are sleeping. Warm water bath  Fill a tub with warm water.  Place the affected body part in the tub.  Soak the area for 20 to 40 minutes.  Repeat as needed. Hot water bottle  Fill the water bottle half full with hot water.  Press out the extra air. Close the cap tightly.  Place a dry towel between your skin and the bottle.  Put the bottle on the area for 5 minutes, and check your skin. Your skin may be pink, but it should not be red.  Leave the bottle on the area for 15 to 30 minutes.  Repeat this every 2 to 4 hours while awake. Electric heating pad  Place a dry towel between your skin and the heating pad.  Set the heating pad on low heat.  Put the heating pad on the area for 10 minutes, and check your skin. Your skin may be pink, but it should not be red.  Leave the heating pad on the area for 20 to 40 minutes.  Repeat this every 2 to 4 hours while awake.  Do not lie on the heating pad.  Do not fall asleep while using the heating pad.  Do not use the heating pad near water. GET HELP RIGHT AWAY IF:  You get blisters or red skin.  Your skin is puffy (swollen), or you lose feeling (numbness) in the affected area.  You  have any new problems.  Your problems are getting worse.  You have any questions or concerns. If you have any problems, stop using heat therapy until you see your doctor. MAKE SURE YOU:  Understand these instructions.  Will watch your condition.  Will get help right away if you are not doing well or get worse. Document Released: 03/06/2012 Document Reviewed: 03/06/2012 [facility] Patient Information 2014 [place].  Muscle Cramps and Spasms Muscle cramps and spasms are when muscles tighten by themselves. They usually get better within minutes. Muscle cramps are painful. They are usually stronger and last longer than muscle spasms. Muscle spasms may or may not be painful. They can last a few seconds or much longer. HOME CARE  Drink enough fluid to keep your pee (urine) clear or pale yellow.  Massage, stretch, and relax the muscle.  Use a warm towel, heating pad, or warm shower water on tight muscles.  Place ice on the muscle if it is tender or in pain.  Put ice in a plastic bag.  Place a towel between your skin and the bag.  Leave the ice on for 15-20 minutes, 03-04 times a  day.  Only take medicine as told by your doctor. GET HELP RIGHT AWAY IF:  Your cramps or spasms get worse, happen more often, or do not get better with time. MAKE SURE YOU:  Understand these instructions.  Will watch your condition.  Will get help right away if you are not doing well or get worse. Document Released: 11/25/2008 Document Revised: 04/09/2013 Document Reviewed: 11/29/2012 [facility] Patient Information 2014 [place], [place]. Take medication as directed for the next several days.  He can use moist heat compresses, to the area, as often as needed.  Gentle exercises with slow movement.  Will help you heal faster.  Rather than fast jerking movements.  Please try to avoid heavy lifting or strenuous exercise for the next several days

## 2014-01-20 NOTE — ED Notes (Signed)
Pt here with left shoulder pain that has been ongoing for about 2 weeks , pt went to urgent care and and got a pain shot and scripts for pain and is out of meds and her shoulder stills hurts

## 2014-01-22 NOTE — ED Provider Notes (Signed)
Medical screening examination/treatment/procedure(s) were performed by non-physician practitioner and as supervising physician I was immediately available for consultation/collaboration.  EKG Interpretation   None         Neta Ehlers, MD 01/22/14 [id number]

## 2014-01-31 ENCOUNTER — Encounter: Payer: Self-pay | Admitting: Pulmonary Disease

## 2014-01-31 ENCOUNTER — Ambulatory Visit (INDEPENDENT_AMBULATORY_CARE_PROVIDER_SITE_OTHER): Payer: Medicare Other | Admitting: Pulmonary Disease

## 2014-01-31 VITALS — BP 110/88 | HR 60 | Ht 67.0 in | Wt 184.0 lb

## 2014-01-31 DIAGNOSIS — D863 Sarcoidosis of skin: Secondary | ICD-10-CM

## 2014-01-31 DIAGNOSIS — J45909 Unspecified asthma, uncomplicated: Secondary | ICD-10-CM

## 2014-01-31 DIAGNOSIS — D869 Sarcoidosis, unspecified: Secondary | ICD-10-CM

## 2014-01-31 DIAGNOSIS — F17200 Nicotine dependence, unspecified, uncomplicated: Secondary | ICD-10-CM

## 2014-01-31 NOTE — Progress Notes (Signed)
Chief Complaint  Patient presents with  . Sarcoidosis    Still having chest wall pain. Went to UC and [facility].    History of Present Illness: Rhonda Hester is a 38 y.o. female smoker with asthma, allergic rhinitis, and sarcoidosis (pulmonary and skin involvement).  She was seen in the ER recently for Lt shoulder and back pain.  She reports pain with movement.  She has occasional cough.  She "wheezes" when she goes up stairs.  She denies fever, or hemoptysis.  She continues to have skin lesion on her left knee.  TESTS: CT chest 12/30/04 >> b/l hilar and mediastinal adenopathy with b/l nodular interstitial opacities  CT chest 07/08/08 >> decreased lymph nodes, scarring with volume loss  RAST 07/22/11 >> Multiple allergens, IgE 71.8  PFT 09/02/11 >> FEV1 2.80(93%), FEV1% 82, TLC 4.74(89%), DLCO 59%, DLCO/VA 95%, no BD response.  PFT 09/25/13 >> FEV1 2.75 (95%), FEV1% 81, TLC 4.25 (77%), DLCO 64%, no BD response. CT chest 11/20/13 >>  Scattered areas of ATX   Rhonda Hester  has a past medical history of Seasonal allergic rhinitis; Eczema; Interstitial cystitis; Allergic asthma; Sarcoidosis of lung; Shortness of breath; History of Bell's palsy; History of anal fissures; and History of gastroesophageal reflux (GERD).  Rhonda Hester  has past surgical history that includes Dilation and curettage of uterus (1997); CYSTOSCOPY/ HYDRODISTENTION/ BLADDER BX (06-09-2010); and cysto with hydrodistension (N/A, 08/24/2013).  Prior to Admission medications   Medication Sig Start Date End Date Taking? Authorizing Provider  albuterol (PROAIR HFA) 108 (90 BASE) MCG/ACT inhaler Inhale 2 puffs into the lungs every 6 (six) hours as needed for shortness of breath. 11/19/13  Yes Chesley Mires, MD  diazepam (VALIUM) 5 MG tablet Take 1 tablet (5 mg total) by mouth every 8 (eight) hours as needed for anxiety. 01/20/14  Yes Garald Balding, NP  diclofenac (CATAFLAM) 50 MG tablet Take 1 tablet (50 mg  total) by mouth 3 (three) times daily. 01/10/14  Yes Janne Napoleon, NP  mometasone-formoterol (DULERA) 200-5 MCG/ACT AERO Inhale 2 puffs into the lungs 2 (two) times daily. Take 2 puffs first thing in am and then another 2 puffs about 12 hours later. 09/25/13  Yes Tanda Rockers, MD  naproxen (NAPROSYN) 375 MG tablet Take 1 tablet (375 mg total) by mouth 2 (two) times daily with a meal. 01/20/14  Yes Garald Balding, NP  traMADol (ULTRAM) 50 MG tablet Take 1 tablet (50 mg total) by mouth every 6 (six) hours as needed. 01/10/14  Yes Janne Napoleon, NP    Allergies  Allergen Reactions  . Citric Acid Other (See Comments)    AVOIDS DUE TO INTERSTITIAL CYSTITIS  . Dust Mite Extract Itching and Other (See Comments)     coughing  . Pollen Extract Itching and Other (See Comments)     coughing  . Nickel Rash     Physical Exam:  General - No distress ENT - No sinus tenderness, no oral exudate, no LAN Cardiac - s1s2 regular, no murmur Chest - No wheeze/rales/dullness Back - No focal tenderness Abd - Soft, non-tender Ext - Pain with active movement Lt shoulder Neuro - Normal strength Skin - black/brown plaque Lt medial knee region Psych - normal mood, and behavior   Assessment/Plan:  Chesley Mires, MD [facility] Pager:  [phone]

## 2014-01-31 NOTE — Patient Instructions (Signed)
Will arrange for referral to dermatology Follow up in 4 months

## 2014-02-03 NOTE — Assessment & Plan Note (Signed)
I don't think her current pain symptoms are related to sarcoidosis.  She does have persistent skin lesion on her Lt knee area >> will refer her to dermatology for further assessment.

## 2014-02-03 NOTE — Assessment & Plan Note (Signed)
Explained importance of complete smoking cessation, and offered assistance with this.       

## 2014-02-03 NOTE — Assessment & Plan Note (Signed)
She is to continue dulera and albuterol.

## 2014-03-04 ENCOUNTER — Encounter (HOSPITAL_COMMUNITY): Payer: Self-pay | Admitting: Emergency Medicine

## 2014-03-04 ENCOUNTER — Emergency Department (HOSPITAL_COMMUNITY): Payer: Medicare Other

## 2014-03-04 ENCOUNTER — Emergency Department (HOSPITAL_COMMUNITY)
Admission: EM | Admit: 2014-03-04 | Discharge: 2014-03-05 | Disposition: A | Discharge status: Home / Self Care | Payer: Medicare Other | Attending: Emergency Medicine | Admitting: Emergency Medicine

## 2014-03-04 DIAGNOSIS — Z872 Personal history of diseases of the skin and subcutaneous tissue: Secondary | ICD-10-CM | POA: Insufficient documentation

## 2014-03-04 DIAGNOSIS — IMO0002 Reserved for concepts with insufficient information to code with codable children: Secondary | ICD-10-CM | POA: Insufficient documentation

## 2014-03-04 DIAGNOSIS — Z8719 Personal history of other diseases of the digestive system: Secondary | ICD-10-CM | POA: Insufficient documentation

## 2014-03-04 DIAGNOSIS — M542 Cervicalgia: Secondary | ICD-10-CM | POA: Insufficient documentation

## 2014-03-04 DIAGNOSIS — Z8742 Personal history of other diseases of the female genital tract: Secondary | ICD-10-CM | POA: Insufficient documentation

## 2014-03-04 DIAGNOSIS — R599 Enlarged lymph nodes, unspecified: Secondary | ICD-10-CM

## 2014-03-04 DIAGNOSIS — Z3202 Encounter for pregnancy test, result negative: Secondary | ICD-10-CM | POA: Insufficient documentation

## 2014-03-04 DIAGNOSIS — Z79899 Other long term (current) drug therapy: Secondary | ICD-10-CM | POA: Insufficient documentation

## 2014-03-04 DIAGNOSIS — Z8619 Personal history of other infectious and parasitic diseases: Secondary | ICD-10-CM | POA: Insufficient documentation

## 2014-03-04 DIAGNOSIS — Z8669 Personal history of other diseases of the nervous system and sense organs: Secondary | ICD-10-CM | POA: Insufficient documentation

## 2014-03-04 DIAGNOSIS — F17200 Nicotine dependence, unspecified, uncomplicated: Secondary | ICD-10-CM | POA: Insufficient documentation

## 2014-03-04 DIAGNOSIS — J45909 Unspecified asthma, uncomplicated: Secondary | ICD-10-CM | POA: Insufficient documentation

## 2014-03-04 DIAGNOSIS — Z791 Long term (current) use of non-steroidal anti-inflammatories (NSAID): Secondary | ICD-10-CM | POA: Insufficient documentation

## 2014-03-04 LAB — CBC WITH DIFFERENTIAL/PLATELET
BASOS ABS: 0 10*3/uL (ref 0.0–0.1)
Basophils Relative: 1 % (ref 0–1)
Eosinophils Absolute: 0.3 10*3/uL (ref 0.0–0.7)
Eosinophils Relative: 4 % (ref 0–5)
HEMATOCRIT: 35.3 % — AB (ref 36.0–46.0)
HEMOGLOBIN: 12 g/dL (ref 12.0–15.0)
LYMPHS PCT: 35 % (ref 12–46)
Lymphs Abs: 2.1 10*3/uL (ref 0.7–4.0)
MCH: 28.4 pg (ref 26.0–34.0)
MCHC: 34 g/dL (ref 30.0–36.0)
MCV: 83.5 fL (ref 78.0–100.0)
MONO ABS: 0.4 10*3/uL (ref 0.1–1.0)
MONOS PCT: 8 % (ref 3–12)
NEUTROS ABS: 3.1 10*3/uL (ref 1.7–7.7)
Neutrophils Relative %: 53 % (ref 43–77)
Platelets: 265 10*3/uL (ref 150–400)
RBC: 4.23 MIL/uL (ref 3.87–5.11)
RDW: 13 % (ref 11.5–15.5)
WBC: 5.9 10*3/uL (ref 4.0–10.5)

## 2014-03-04 LAB — I-STAT CHEM 8, ED
BUN: 6 mg/dL (ref 6–23)
CHLORIDE: 104 meq/L (ref 96–112)
Calcium, Ion: 1.23 mmol/L (ref 1.12–1.23)
Creatinine, Ser: 0.6 mg/dL (ref 0.50–1.10)
Glucose, Bld: 92 mg/dL (ref 70–99)
HEMATOCRIT: 37 % (ref 36.0–46.0)
Hemoglobin: 12.6 g/dL (ref 12.0–15.0)
POTASSIUM: 3.5 meq/L — AB (ref 3.7–5.3)
SODIUM: 140 meq/L (ref 137–147)
TCO2: 22 mmol/L (ref 0–100)

## 2014-03-04 LAB — POC URINE PREG, ED: Preg Test, Ur: NEGATIVE

## 2014-03-04 MED ORDER — ACETAMINOPHEN 325 MG PO TABS
650.0000 mg | ORAL_TABLET | Freq: Once | ORAL | Status: AC
  Administered 2014-03-04: 650 mg via ORAL
  Filled 2014-03-04: qty 2

## 2014-03-04 MED ORDER — ONDANSETRON HCL 4 MG/2ML IJ SOLN
4.0000 mg | Freq: Once | INTRAMUSCULAR | Status: AC
  Administered 2014-03-04: 4 mg via INTRAVENOUS
  Filled 2014-03-04: qty 2

## 2014-03-04 MED ORDER — HYDROMORPHONE HCL PF 1 MG/ML IJ SOLN
0.5000 mg | Freq: Once | INTRAMUSCULAR | Status: AC
  Administered 2014-03-04: 0.5 mg via INTRAVENOUS
  Filled 2014-03-04: qty 1

## 2014-03-04 MED ORDER — IOHEXOL 300 MG/ML  SOLN
100.0000 mL | Freq: Once | INTRAMUSCULAR | Status: AC | PRN
  Administered 2014-03-04: 100 mL via INTRAVENOUS

## 2014-03-04 NOTE — ED Provider Notes (Signed)
CSN: [id number]     Arrival date & time 03/04/14  1907 History   This chart was scribed for non-physician practitioner Garald Balding, NP,  working with Dot Lanes, MD, by Neta Ehlers, ED Scribe. This patient was seen in room WTR8/WTR8 and the patient's care was started at 9:03 PM.  First MD Initiated Contact with Patient 03/04/14 2057     Chief Complaint  Patient presents with  . Arm Pain    The history is provided by the patient. No language interpreter was used.   HPI Comments: Rhonda Hester is a 38 y.o. female, with a h/o sarcodosis, who presents to the Emergency Department complaining of left shoulder pain which has persisted for approximately two months. The pt reports the pain disrupts her sleep, and the pain radiates from her shoulder up her neck to her eye. She denies recent trauma, injuries, or falls which may have precipitated the shoulder pain. Additionally, she is unemployed, and thus denies any work-related injuries.  She has visited the pulmonologist concerning the pain. She has also visited an urgent care twice as well  as [facility] and an urologist. She reports the area is more swollen   Past Medical History  Diagnosis Date  . Seasonal allergic rhinitis   . Eczema   . Interstitial cystitis   . Allergic asthma   . Sarcoidosis of lung     DX 2006--  PULMOLOGIST--  DR PYPP  . Shortness of breath   . History of Bell's palsy     2006  RIGHT SIDE--  RESOLVED  . History of anal fissures   . History of gastroesophageal reflux (GERD)    Past Surgical History  Procedure Laterality Date  . Dilation and curettage of uterus  1997  . Cystoscopy/ hydrodistention/ bladder bx  06-09-2010  . Cysto with hydrodistension N/A 08/24/2013    Procedure: CYSTOSCOPY/HYDRODISTENSION with instillation of marcaine and pyridium;  Surgeon: Fredricka Bonine, MD;  Location: [facility];  Service: Urology;  Laterality: N/A;   Family History  Problem Relation Age  of Onset  . Brain cancer Maternal Grandmother    History  Substance Use Topics  . Smoking status: Current Some Day Smoker -- 0.30 packs/day for 6 years    Types: Cigarettes  . Smokeless tobacco: Never Used     Comment: ONE CIG. DAILY /  1 PPMONTH  . Alcohol Use: No   OB History   Grav Para Term Preterm Abortions TAB SAB Ect Mult Living   1 0 0 0 1 0 1 0 0 0      Review of Systems  Constitutional: Negative for fever.  HENT: Negative for facial swelling, sore throat and trouble swallowing.   Respiratory: Negative for shortness of breath.   Cardiovascular: Negative for chest pain.  Musculoskeletal: Positive for arthralgias and neck pain. Negative for neck stiffness.  Skin: Negative for rash and wound.  Neurological: Negative for dizziness, weakness, numbness and headaches.  All other systems reviewed and are negative.    Allergies  Citric acid; Dust mite extract; Pollen extract; and Nickel  Home Medications   Current Outpatient Rx  Name  Route  Sig  Dispense  Refill  . albuterol (PROAIR HFA) 108 (90 BASE) MCG/ACT inhaler   Inhalation   Inhale 2 puffs into the lungs every 6 (six) hours as needed for shortness of breath.   1 Inhaler   5   . diazepam (VALIUM) 5 MG tablet   Oral  Take 1 tablet (5 mg total) by mouth every 8 (eight) hours as needed for anxiety.   15 tablet   0   . diclofenac (CATAFLAM) 50 MG tablet   Oral   Take 50 mg by mouth 3 (three) times daily.         . mometasone-formoterol (DULERA) 200-5 MCG/ACT AERO   Inhalation   Inhale 2 puffs into the lungs 2 (two) times daily. Take 2 puffs first thing in am and then another 2 puffs about 12 hours later.   1 Inhaler   11   . naproxen (NAPROSYN) 375 MG tablet   Oral   Take 1 tablet (375 mg total) by mouth 2 (two) times daily with a meal.   30 tablet   0   . HYDROcodone-acetaminophen (NORCO/VICODIN) 5-325 MG per tablet   Oral   Take 1-2 tablets by mouth every 4 (four) hours as needed.   14 tablet    0    Triage Vitals: BP 143/68  Pulse 65  Temp(Src) 98.6 F (37 C) (Oral)  Resp 18  SpO2 98%  Physical Exam  Nursing note and vitals reviewed. Constitutional: She is oriented to person, place, and time. She appears well-developed and well-nourished. No distress.  HENT:  Head: Normocephalic and atraumatic.  Eyes: EOM are normal.  Neck: Neck supple. No tracheal deviation present.  Cardiovascular: Normal rate.   Pulmonary/Chest: Effort normal. No respiratory distress.  Musculoskeletal: Normal range of motion. She exhibits tenderness.  A very tender mass from the clavicle to the apex of shoulder, radiating to base of the jaw.   Neurological: She is alert and oriented to person, place, and time.  Skin: Skin is warm and dry.  Psychiatric: She has a normal mood and affect. Her behavior is normal.    ED Course  Procedures (including critical care time)  DIAGNOSTIC STUDIES: Oxygen Saturation is 98% on room air, normal by my interpretation.    COORDINATION OF CARE:  9:08 PM- Discussed treatment plan with patient, which includes imaging and pain medication, and the patient agreed to the plan.   Labs Review Labs Reviewed  CBC WITH DIFFERENTIAL - Abnormal; Notable for the following:    HCT 35.3 (*)    All other components within normal limits  I-STAT CHEM 8, ED - Abnormal; Notable for the following:    Potassium 3.5 (*)    All other components within normal limits  POC URINE PREG, ED   Imaging Review Ct Soft Tissue Neck W Contrast  03/04/2014   CLINICAL DATA:  Neck pain, left shoulder swelling. History of sarcoidosis.  EXAM: CT NECK WITH CONTRAST  TECHNIQUE: Multidetector CT imaging of the neck was performed using the standard protocol following the bolus administration of intravenous contrast.  CONTRAST:  155mL OMNIPAQUE IOHEXOL 300 MG/ML  SOLN  COMPARISON:  CT scan of head of October 06, 2004.  FINDINGS: Interstitial densities are noted in the visualized portions of both upper  lobes consistent with history of sarcoidosis. Epiglottis appears normal. Thyroid gland and larynx appear normal. Paranasal sinuses appear normal. Parapharyngeal spaces appear normal. Parotid glands appear normal bilaterally. No abnormal fluid collection is noted. Visualized vasculature appears normal. Enlarged lymph node measuring 19 x 14 mm is noted in left supraclavicular region. Airway appears patent. Globes and orbits appear normal.  IMPRESSION: Interstitial densities are noted in the visualized portions of both upper lobes consistent with history of sarcoidosis.  Enlarged left supraclavicular lymph node measuring 19 x 14 mm is noted which  may be related to patient's history of sarcoidosis, but neoplasm cannot be excluded.   Electronically Signed   By: Sabino Dick M.D.   On: 03/04/2014 23:42     EKG Interpretation None     Discuss with radiology, the best way to use this area, and he recommends CT soft tissue of the neck I spoke with Dr. due to getting from pulmonary critical care to arrange followup.  She is recommends that the patient.  Go to [place] [facility] for biopsy with report sent to Dr. Halford Chessman.  She will be given Vicodin for pain control, and information on arranging followup MDM   Final diagnoses:  Lymph node enlargement      I personally performed the services described in this document, which was scribed in my presence. The recorded information has been reviewed and is accurate.     Garald Balding, NP 03/05/14 0010

## 2014-03-04 NOTE — ED Notes (Signed)
Waiting on CT results

## 2014-03-04 NOTE — ED Notes (Signed)
Pt c/o left shoulder pain. Pt states pain has been going on since about 2 months. Pt [facility]. Pt also went to her urologist this am and dermatologist for her sarcodosis. Pt states she just wants to stop hurting.

## 2014-03-05 MED ORDER — HYDROCODONE-ACETAMINOPHEN 5-325 MG PO TABS: 1.0000 | ORAL_TABLET | ORAL | Status: DC | PRN

## 2014-03-05 NOTE — Discharge Instructions (Signed)
Swollen Lymph Nodes The lymphatic system filters fluid from around cells. It is like a system of blood vessels. These channels carry lymph instead of blood. The lymphatic system is an important part of the immune (disease fighting) system. When people talk about "swollen glands in the neck," they are usually talking about swollen lymph nodes. The lymph nodes are like the little traps for infection. You and your caregiver may be able to feel lymph nodes, especially swollen nodes, in these common areas: the groin (inguinal area), armpits (axilla), and above the clavicle (supraclavicular). You may also feel them in the neck (cervical) and the back of the head just above the hairline (occipital). Swollen glands occur when there is any condition in which the body responds with an allergic type of reaction. For instance, the glands in the neck can become swollen from insect bites or any type of minor infection on the head. These are very noticeable in children with only minor problems. Lymph nodes may also become swollen when there is a tumor or problem with the lymphatic system, such as Hodgkin's disease. TREATMENT   Most swollen glands do not require treatment. They can be observed (watched) for a short period of time, if your caregiver feels it is necessary. Most of the time, observation is not necessary.  Antibiotics (medicines that kill germs) may be prescribed by your caregiver. Your caregiver may prescribe these if he or she feels the swollen glands are due to a bacterial (germ) infection. Antibiotics are not used if the swollen glands are caused by a virus. HOME CARE INSTRUCTIONS   Take medications as directed by your caregiver. Only take over-the-counter or prescription medicines for pain, discomfort, or fever as directed by your caregiver. SEEK MEDICAL CARE IF:   If you begin to run a temperature greater than 102 F (38.9 C), or as your caregiver suggests. MAKE SURE YOU:   Understand these  instructions.  Will watch your condition.  Will get help right away if you are not doing well or get worse. Document Released: 12/03/2002 Document Revised: 03/06/2012 Document Reviewed: 12/13/2005 [facility] Patient Information 2014 [place]. CT scan, shows that you have quite a large node please call [facility] [place] [facility] to schedule an appointment for a biopsy.You have  been given a prescription for Vicodin to help control.  Your pain

## 2014-03-05 NOTE — ED Provider Notes (Signed)
Medical screening examination/treatment/procedure(s) were performed by non-physician practitioner and as supervising physician I was immediately available for consultation/collaboration.   Alveta Quintela L Norene Oliveri, MD 03/05/14 1248 

## 2014-03-10 ENCOUNTER — Encounter (HOSPITAL_COMMUNITY): Payer: Self-pay | Admitting: Emergency Medicine

## 2014-03-10 ENCOUNTER — Emergency Department (HOSPITAL_COMMUNITY)
Admission: EM | Admit: 2014-03-10 | Discharge: 2014-03-10 | Disposition: A | Discharge status: Home / Self Care | Payer: Medicare Other | Attending: Emergency Medicine | Admitting: Emergency Medicine

## 2014-03-10 DIAGNOSIS — R599 Enlarged lymph nodes, unspecified: Secondary | ICD-10-CM | POA: Insufficient documentation

## 2014-03-10 DIAGNOSIS — D869 Sarcoidosis, unspecified: Secondary | ICD-10-CM | POA: Insufficient documentation

## 2014-03-10 DIAGNOSIS — Z8719 Personal history of other diseases of the digestive system: Secondary | ICD-10-CM | POA: Insufficient documentation

## 2014-03-10 DIAGNOSIS — J45901 Unspecified asthma with (acute) exacerbation: Secondary | ICD-10-CM | POA: Insufficient documentation

## 2014-03-10 DIAGNOSIS — J99 Respiratory disorders in diseases classified elsewhere: Secondary | ICD-10-CM | POA: Insufficient documentation

## 2014-03-10 DIAGNOSIS — M542 Cervicalgia: Secondary | ICD-10-CM | POA: Insufficient documentation

## 2014-03-10 DIAGNOSIS — F17200 Nicotine dependence, unspecified, uncomplicated: Secondary | ICD-10-CM | POA: Insufficient documentation

## 2014-03-10 DIAGNOSIS — Z792 Long term (current) use of antibiotics: Secondary | ICD-10-CM | POA: Insufficient documentation

## 2014-03-10 DIAGNOSIS — IMO0002 Reserved for concepts with insufficient information to code with codable children: Secondary | ICD-10-CM | POA: Insufficient documentation

## 2014-03-10 DIAGNOSIS — Z8669 Personal history of other diseases of the nervous system and sense organs: Secondary | ICD-10-CM | POA: Insufficient documentation

## 2014-03-10 DIAGNOSIS — Z8742 Personal history of other diseases of the female genital tract: Secondary | ICD-10-CM | POA: Insufficient documentation

## 2014-03-10 DIAGNOSIS — Z79899 Other long term (current) drug therapy: Secondary | ICD-10-CM | POA: Insufficient documentation

## 2014-03-10 DIAGNOSIS — D860 Sarcoidosis of lung: Secondary | ICD-10-CM

## 2014-03-10 DIAGNOSIS — Z872 Personal history of diseases of the skin and subcutaneous tissue: Secondary | ICD-10-CM | POA: Insufficient documentation

## 2014-03-10 LAB — COMPREHENSIVE METABOLIC PANEL
ALT: 29 U/L (ref 0–35)
AST: 27 U/L (ref 0–37)
Albumin: 3.9 g/dL (ref 3.5–5.2)
Alkaline Phosphatase: 63 U/L (ref 39–117)
BUN: 11 mg/dL (ref 6–23)
CO2: 21 mEq/L (ref 19–32)
Calcium: 9.8 mg/dL (ref 8.4–10.5)
Chloride: 101 mEq/L (ref 96–112)
Creatinine, Ser: 0.65 mg/dL (ref 0.50–1.10)
GFR calc Af Amer: >90 mL/min (ref >90)
GFR calc non Af Amer: >90 mL/min (ref >90)
Glucose, Bld: 96 mg/dL (ref 70–99)
Potassium: 4.2 mEq/L (ref 3.7–5.3)
Sodium: 137 mEq/L (ref 137–147)
TOTAL PROTEIN: 7.9 g/dL (ref 6.0–8.3)
Total Bilirubin: 0.3 mg/dL (ref 0.3–1.2)

## 2014-03-10 LAB — CBC WITH DIFFERENTIAL/PLATELET
BASOS PCT: 1 % (ref 0–1)
Basophils Absolute: 0.1 10*3/uL (ref 0.0–0.1)
EOS ABS: 0.3 10*3/uL (ref 0.0–0.7)
Eosinophils Relative: 5 % (ref 0–5)
HEMATOCRIT: 36.3 % (ref 36.0–46.0)
Hemoglobin: 12.4 g/dL (ref 12.0–15.0)
Lymphocytes Relative: 31 % (ref 12–46)
Lymphs Abs: 1.7 10*3/uL (ref 0.7–4.0)
MCH: 28.8 pg (ref 26.0–34.0)
MCHC: 34.2 g/dL (ref 30.0–36.0)
MCV: 84.2 fL (ref 78.0–100.0)
MONO ABS: 0.3 10*3/uL (ref 0.1–1.0)
MONOS PCT: 6 % (ref 3–12)
NEUTROS PCT: 57 % (ref 43–77)
Neutro Abs: 3.2 10*3/uL (ref 1.7–7.7)
Platelets: 251 10*3/uL (ref 150–400)
RBC: 4.31 MIL/uL (ref 3.87–5.11)
RDW: 12.9 % (ref 11.5–15.5)
WBC: 5.6 10*3/uL (ref 4.0–10.5)

## 2014-03-10 LAB — D-DIMER, QUANTITATIVE (NOT AT ARMC): D-Dimer, Quant: <0.27 ug/mL-FEU (ref 0.00–0.48)

## 2014-03-10 LAB — TROPONIN I

## 2014-03-10 MED ORDER — HYDROCODONE-ACETAMINOPHEN 5-325 MG PO TABS
1.0000 | ORAL_TABLET | Freq: Once | ORAL | Status: AC
  Administered 2014-03-10: 1 via ORAL
  Filled 2014-03-10: qty 1

## 2014-03-10 MED ORDER — HYDROCODONE-ACETAMINOPHEN 5-325 MG PO TABS: 1.0000 | ORAL_TABLET | Freq: Four times a day (QID) | ORAL | Status: DC | PRN

## 2014-03-10 NOTE — ED Notes (Signed)
She is here for an evaluation of a sore, left-sided supraclavicular node.  She tells me she has an impending app't. With a surgeon ~2 weeks.  She becomes so frustrated with my routine questions at triage, that I abridge my interview.  She is in no distress.

## 2014-03-10 NOTE — ED Provider Notes (Signed)
CSN: [id number]     Arrival date & time 03/10/14  1446 History   First MD Initiated Contact with Patient 03/10/14 1505     Chief Complaint  Patient presents with  . Shoulder Pain     (Consider location/radiation/quality/duration/timing/severity/associated sxs/prior Treatment) The history is provided by the patient. No language interpreter was used.  Rhonda Hester is a 38 y/o F with PMhx of sarcoidosis of the lungs and continues to smoke, bell's palsy, GERD, allergic asthma, eczema presenting to the ED with left supraclavicular pain that has been ongoing for the past 2 months. Patient reported that she was seen numerous times in the ED setting reported that she was at first diagnosed with URI and then muscle spasm - stated that the last time she was seen here she was diagnosed with an enlarged lymph node. Reported that she was referred to [facility] and Pulmonologist, Dr. Halford Chessman - recommended to have a biopsy. Patient reported that she is trying to get in to see the [facility] [place] to get the referral, but cannot until be seen until 2 weeks and patient is in pain and wants to see if the process and be quickened. Patient reported that she was discharged with Vicodin the last time which aided in her pain - made the pain bearable. Stated that the pain is localized to the left supraclavicular region described as a sharp, throbbing sensation that is worse with motion and applying pressure. Stated that she has noticed increase in swelling with associated chest pain and shortness of breath. Stated that she is constantly in pain with this and wants to be out of pain. Stated that the pain radiates up her neck and left shoulder. Denied fever, chills, weakness, numbness, fall, injury, dizziness. [facility] Pulmon[facility] Dr. Halford Chessman  Past Medical History  Diagnosis Date  . Seasonal allergic rhinitis   . Eczema   . Interstitial cystitis   . Allergic asthma   . Sarcoidosis of lung    DX 2006--  PULMOLOGIST--  DR [id number]  . Shortness of breath   . History of Bell's palsy     2006  RIGHT SIDE--  RESOLVED  . History of anal fissures   . History of gastroesophageal reflux (GERD)    Past Surgical History  Procedure Laterality Date  . Dilation and curettage of uterus  1997  . Cystoscopy/ hydrodistention/ bladder bx  06-09-2010  . Cysto with hydrodistension N/A 08/24/2013    Procedure: CYSTOSCOPY/HYDRODISTENSION with instillation of marcaine and pyridium;  Surgeon: Fredricka Bonine, MD;  Location: [facility];  Service: Urology;  Laterality: N/A;   Family History  Problem Relation Age of Onset  . Brain cancer Maternal Grandmother    History  Substance Use Topics  . Smoking status: Current Some Day Smoker -- 0.30 packs/day for 6 years    Types: Cigarettes  . Smokeless tobacco: Never Used     Comment: ONE CIG. DAILY /  1 PPMONTH  . Alcohol Use: No   OB History   Grav Para Term Preterm Abortions TAB SAB Ect Mult Living   1 0 0 0 1 0 1 0 0 0      Review of Systems  Constitutional: Negative for fever and chills.  Respiratory: Positive for shortness of breath.   Cardiovascular: Positive for chest pain.  Musculoskeletal: Positive for neck pain.  Neurological: Negative for weakness and numbness.  All other systems reviewed and are negative.      Allergies  Citric  acid; Dust mite extract; Pollen extract; and Nickel  Home Medications   Current Outpatient Rx  Name  Route  Sig  Dispense  Refill  . albuterol (PROAIR HFA) 108 (90 BASE) MCG/ACT inhaler   Inhalation   Inhale 2 puffs into the lungs every 6 (six) hours as needed for shortness of breath.   1 Inhaler   5   . HYDROcodone-acetaminophen (NORCO/VICODIN) 5-325 MG per tablet   Oral   Take 1 tablet by mouth every 6 (six) hours as needed for moderate pain.         . hydrocortisone valerate cream (WESTCORT) 0.2 %   Topical   Apply 1 application topically 2 (two) times daily.          . hydrOXYzine (ATARAX/VISTARIL) 10 MG tablet   Oral   Take 10 mg by mouth at bedtime as needed for itching.         . mometasone-formoterol (DULERA) 200-5 MCG/ACT AERO   Inhalation   Inhale 2 puffs into the lungs 2 (two) times daily.         . penicillin v potassium (VEETID) 500 MG tablet   Oral   Take 500 mg by mouth 4 (four) times daily.         . polyethylene glycol (MIRALAX / GLYCOLAX) packet   Oral   Take 17 g by mouth daily.         Marland Kitchen HYDROcodone-acetaminophen (NORCO/VICODIN) 5-325 MG per tablet   Oral   Take 1 tablet by mouth every 6 (six) hours as needed.   7 tablet   0    BP 131/70  Pulse 77  Temp(Src) 98.6 F (37 C) (Oral)  Resp 18  SpO2 96% Physical Exam  Nursing note and vitals reviewed. Constitutional: She is oriented to person, place, and time. She appears well-developed and well-nourished. No distress.  HENT:  Head: Normocephalic and atraumatic.  Mouth/Throat: Oropharynx is clear and moist. No oropharyngeal exudate.  Eyes: Conjunctivae and EOM are normal. Pupils are equal, round, and reactive to light. Right eye exhibits no discharge. Left eye exhibits no discharge.  Neck: Normal range of motion. Neck supple. No tracheal deviation present. No mass present.    Tender mass upon palpation to the left supraclavicular region.   Cardiovascular: Normal rate, regular rhythm and normal heart sounds.   Cap refill < 3 seconds  Pulmonary/Chest: Effort normal and breath sounds normal. No respiratory distress. She has no wheezes. She has no rales.  Musculoskeletal: Normal range of motion.  Full ROM to upper and lower extremities without difficulty noted, negative ataxia noted.  Lymphadenopathy:    She has no cervical adenopathy.  Neurological: She is alert and oriented to person, place, and time. No cranial nerve deficit. She exhibits normal muscle tone. Coordination normal.  Cranial nerves III-XII grossly intact Strength 5+/5+ to upper and lower  extremities bilaterally with resistance applied, equal distribution noted Strength intact to MCP, PIP, DIP joints of bilateral hands Negative arm drift Gait proper, proper balance - negative sway, negative drift, negative step-offs  Skin: Skin is warm and dry. No rash noted. She is not diaphoretic. No erythema.  Psychiatric: She has a normal mood and affect. Her behavior is normal. Thought content normal.    ED Course  Procedures (including critical care time)  4:18 PM This provider spoke with Dr. Marcello Moores, [place] surgeon - discussed case, history, presentation and situation of the patient. Reported that patient does need to be seen by PCP and get referral  for biopsy to be performed. Reported that referral cannot be from ED, has to be from PCP.   5:42 PM This provider had a long discussion with the patient regarding what the surgeon said and the importance of keeping appointment with PCP and getting referral. Patient reported that she has not followed-up with her pulmonologist - discussed with patient that she should call and see her physician and that her pulmonologist could possible give her a referral.   Results for orders placed during the hospital encounter of 03/10/14  CBC WITH DIFFERENTIAL      Result Value Ref Range   WBC 5.6  4.0 - 10.5 K/uL   RBC 4.31  3.87 - 5.11 MIL/uL   Hemoglobin 12.4  12.0 - 15.0 g/dL   HCT 36.3  36.0 - 46.0 %   MCV 84.2  78.0 - 100.0 fL   MCH 28.8  26.0 - 34.0 pg   MCHC 34.2  30.0 - 36.0 g/dL   RDW 12.9  11.5 - 15.5 %   Platelets 251  150 - 400 K/uL   Neutrophils Relative % 57  43 - 77 %   Neutro Abs 3.2  1.7 - 7.7 K/uL   Lymphocytes Relative 31  12 - 46 %   Lymphs Abs 1.7  0.7 - 4.0 K/uL   Monocytes Relative 6  3 - 12 %   Monocytes Absolute 0.3  0.1 - 1.0 K/uL   Eosinophils Relative 5  0 - 5 %   Eosinophils Absolute 0.3  0.0 - 0.7 K/uL   Basophils Relative 1  0 - 1 %   Basophils Absolute 0.1  0.0 - 0.1 K/uL  COMPREHENSIVE METABOLIC PANEL      Result  Value Ref Range   Sodium 137  137 - 147 mEq/L   Potassium 4.2  3.7 - 5.3 mEq/L   Chloride 101  96 - 112 mEq/L   CO2 21  19 - 32 mEq/L   Glucose, Bld 96  70 - 99 mg/dL   BUN 11  6 - 23 mg/dL   Creatinine, Ser 0.65  0.50 - 1.10 mg/dL   Calcium 9.8  8.4 - 10.5 mg/dL   Total Protein 7.9  6.0 - 8.3 g/dL   Albumin 3.9  3.5 - 5.2 g/dL   AST 27  0 - 37 U/L   ALT 29  0 - 35 U/L   Alkaline Phosphatase 63  39 - 117 U/L   Total Bilirubin 0.3  0.3 - 1.2 mg/dL   GFR calc non Af Amer >90  >90 mL/min   GFR calc Af Amer >90  >90 mL/min  TROPONIN I      Result Value Ref Range   Troponin I <0.30  <0.30 ng/mL  D-DIMER, QUANTITATIVE      Result Value Ref Range   D-Dimer, Quant <0.27  0.00 - 0.48 ug/mL-FEU     Labs Review Labs Reviewed  CBC WITH DIFFERENTIAL  COMPREHENSIVE METABOLIC PANEL  TROPONIN I  D-DIMER, QUANTITATIVE   Imaging Review No results found.   EKG Interpretation   Date/Time:  Sunday March 10 2014 15:59:50 EDT Ventricular Rate:  66 PR Interval:  165 QRS Duration: 75 QT Interval:  374 QTC Calculation: 392 R Axis:   64 Text Interpretation:  Sinus rhythm Probable left atrial enlargement  Diffuse ST elevation, likely repol These elevations are not new compared  to 2013 Confirmed by GOLDSTON  MD, [place] ([id number]) on 03/10/2014 4:03:40 PM      MDM  Final diagnoses:  Enlarged lymph node  Sarcoidosis of lung   Medications  HYDROcodone-acetaminophen (NORCO/VICODIN) 5-325 MG per tablet 1 tablet (1 tablet Oral Given 03/10/14 1643)   Filed Vitals:   03/10/14 1451  BP: 131/70  Pulse: 77  Temp: 98.6 F (37 C)  TempSrc: Oral  Resp: 18  SpO2: 96%    Patient presenting to the ED with left supraclavicular pain that has been ongoing for the past 2 months - stated that the pain is a constant sharp, stabbing sensation. Reported that the mass in her left supraclavicular region has gotten larger. Reported that she was discharged with Vicodin the last time which aided in her pain  to be bearable. Stated that she ran out of the Vicodin. Patient reported that she has an appointment with [facility] in 2 weeks to be seen and get referral to the [facility], reported that she cannot see [facility] unless she has a referral from her PCP.  This provider reviewed the patient's chart. Patient has been seen and assessed numerous times in the ED regarding this left shoulder pain. Was at diagnosed with URI and then with a muscle spasm. Patient was seen on 03/04/2014 where a CT scan soft tissue neck was performed that identified a 19 x 14 mm enlarged lymph node of th left supraclavicular region. Patient was recommended to follow-up with Dr. Halford Chessman and [facility]. Patient was discharged with Vicodin. Alert and oriented. [facility]CS 15. Heart rate and rhythm normal. Lungs clear to auscultation to upper and lower lobes bilaterally. Radial pulses 2+ bilaterally. Cap refill < 3 seconds. Swelling and tenderness upon palpation to mass in the left supraclavicular region. Full ROM to LUE without difficulty or ataxia.  This provider spoke with Dr. Marcello Moores, [place] - discussed case and situation. As per physician, reported that patient does need to be seen by PCP and get a full work-up to be recommended and have a referral to General Surgery for possible biopsy to be performed.   EKG noted sinus rhythm with heart rate of 66 bpm - diffuse ST elevation that are likely repolarizations that were seen on EKG from 2013. Troponin negative elevation. CBC negative WBC noted - negative left shift or leukocytosis noted. CMP negative findings. D-dimer negative elevation noted.  Doubt cardiac issue. Doubt PE. Suspicion of enlarged lymph node that was diagnosed on 03/04/2014 to be possible sarcoidosis versus neoplasm that cannot be ruled out. High recommended biopsy to be performed. Patient stable, afebrile. Patient aseptic. Pain controlled in ED setting. Discharged patient. Discussed with patient to rest and stay hydrated. Discharged patient with  small dose of pain medications - discussed course, precautions, disposal technique. Referred patient to pulmonologist-discussed with patient to call the physician first thing tomorrow morning to let the physician know about the CT scan of the soft tissue neck. Discussed with patient to keep appointment with [facility]. Discussed with patient to closely monitor symptoms and if symptoms are to worsen or change to report back to the ED - strict return instructions given.  Patient agreed to plan of care, understood, all questions answered.   Jamse Mead, PA-C 03/11/14 [phone]

## 2014-03-10 NOTE — Discharge Instructions (Signed)
Please call your doctor for a followup appointment within 24-48 hours. When you talk to your doctor please let them know that you were seen in the emergency department and have them acquire all of your records so that they can discuss the findings with you and formulate a treatment plan to fully care for your new and ongoing problems. Please call Dr. Halford Chessman first thing tomorrow morning regarding results found on the CT scan of the soft tissue neck Please keep appointment with [facility] Please take pain medications as prescribed. While on pain medications there is to be no drinking alcohol, driving, operating any heavy machinery. If there is extra please dispose in a proper manner. Please do not take any extra Tylenol for this can lead to Tylenol overdose and liver failure. Please rest and stay hydrated Please continue to monitor symptoms closely and if symptoms are to worsen or change (fever greater than 101, chills, neck pain, neck stiffness, chest pain, shortness of breath, difficulty breathing, numbness, tingling, weakness to the arm, enlargement of the growth, increased swelling, inability to swallow, throat closing sensation) please report back to the ED immediately  Lymphadenopathy Lymphadenopathy means "disease of the lymph glands." But the term is usually used to describe swollen or enlarged lymph glands, also called lymph nodes. These are the bean-shaped organs found in many locations including the neck, underarm, and groin. Lymph glands are part of the immune system, which fights infections in your body. Lymphadenopathy can occur in just one area of the body, such as the neck, or it can be generalized, with lymph node enlargement in several areas. The nodes found in the neck are the most common sites of lymphadenopathy. CAUSES  When your immune system responds to germs (such as viruses or bacteria ), infection-fighting cells and fluid build up. This causes the glands to grow in  size. This is usually not something to worry about. Sometimes, the glands themselves can become infected and inflamed. This is called lymphadenitis. Enlarged lymph nodes can be caused by many diseases:  Bacterial disease, such as strep throat or a skin infection.  Viral disease, such as a common cold.  Other germs, such as lyme disease, tuberculosis, or sexually transmitted diseases.  Cancers, such as lymphoma (cancer of the lymphatic system) or leukemia (cancer of the white blood cells).  Inflammatory diseases such as lupus or rheumatoid arthritis.  Reactions to medications. Many of the diseases above are rare, but important. This is why you should see your caregiver if you have lymphadenopathy. SYMPTOMS   Swollen, enlarged lumps in the neck, back of the head or other locations.  Tenderness.  Warmth or redness of the skin over the lymph nodes.  Fever. DIAGNOSIS  Enlarged lymph nodes are often near the source of infection. They can help healthcare providers diagnose your illness. For instance:   Swollen lymph nodes around the jaw might be caused by an infection in the mouth.  Enlarged glands in the neck often signal a throat infection.  Lymph nodes that are swollen in more than one area often indicate an illness caused by a virus. Your caregiver most likely will know what is causing your lymphadenopathy after listening to your history and examining you. Blood tests, x-rays or other tests may be needed. If the cause of the enlarged lymph node cannot be found, and it does not go away by itself, then a biopsy may be needed. Your caregiver will discuss this with you. TREATMENT  Treatment for your enlarged  lymph nodes will depend on the cause. Many times the nodes will shrink to normal size by themselves, with no treatment. Antibiotics or other medicines may be needed for infection. Only take over-the-counter or prescription medicines for pain, discomfort or fever as directed by your  caregiver. HOME CARE INSTRUCTIONS  Swollen lymph glands usually return to normal when the underlying medical condition goes away. If they persist, contact your health-care provider. He/she might prescribe antibiotics or other treatments, depending on the diagnosis. Take any medications exactly as prescribed. Keep any follow-up appointments made to check on the condition of your enlarged nodes.  SEEK MEDICAL CARE IF:   Swelling lasts for more than two weeks.  You have symptoms such as weight loss, night sweats, fatigue or fever that does not go away.  The lymph nodes are hard, seem fixed to the skin or are growing rapidly.  Skin over the lymph nodes is red and inflamed. This could mean there is an infection. SEEK IMMEDIATE MEDICAL CARE IF:   Fluid starts leaking from the area of the enlarged lymph node.  You develop a fever of 102 F (38.9 C) or greater.  Severe pain develops (not necessarily at the site of a large lymph node).  You develop chest pain or shortness of breath.  You develop worsening abdominal pain. MAKE SURE YOU:   Understand these instructions.  Will watch your condition.  Will get help right away if you are not doing well or get worse. Document Released: 09/21/2008 Document Revised: 03/06/2012 Document Reviewed: 09/21/2008 [facility] Patient Information 2014 [place].   Emergency Department Resource Guide 1) Find a Doctor and Pay Out of Pocket Although you won't have to find out who is covered by your insurance plan, it is a good idea to ask around and get recommendations. You will then need to call the office and see if the doctor you have chosen will accept you as a new patient and what types of options they offer for patients who are self-pay. Some doctors offer discounts or will set up payment plans for their patients who do not have insurance, but you will need to ask so you aren't surprised when you get to your appointment.  2) Contact Your Local  Health Department Not all health departments have doctors that can see patients for sick visits, but many do, so it is worth a call to see if yours does. If you don't know where your local health department is, you can check in your phone book. The CDC also has a tool to help you locate your state's health department, and many state websites also have listings of all of their local health departments.  3) Find a [facility] If your illness is not likely to be very severe or complicated, you may want to try a walk in clinic. These are popping up all over the country in pharmacies, drugstores, and shopping centers. They're usually staffed by nurse practitioners or physician assistants that have been trained to treat common illnesses and complaints. They're usually fairly quick and inexpensive. However, if you have serious medical issues or chronic medical problems, these are probably not your best option.  No Primary Care Doctor: - Call Health Connect at  [phone] - they can help you locate a primary care doctor that  accepts your insurance, provides certain services, etc. - Physician Referral Service- [phone]  Chronic Pain Problems: Organization         Address  Phone   Notes  [facility]  [phone] Patients need to be referred by their primary care doctor.   Medication Assistance: Organization         Address  Phone   Notes  [facility] Medication [facility] [address] [place], [place] [address] [phone] --Must be a resident of [facility] -- Must have NO insurance coverage whatsoever (no Medicaid/ Medicare, etc.) -- The pt. MUST have a primary care doctor that directs their care regularly and follows them in the community   Med[facility]ist  [phone]   [employer]  [phone]    Agencies that provide inexpensive medical care: Organization         Address  Phone   Notes  [place]  [phone]    [facility]    [phone]   [facility] [place], [place] [address] [phone]   [place] [address], [place] [phone]   Planned Parenthood    [phone]   [facility]    [phone]   [place] and [place] [address], [place] Phone:  [phone], Fax:  [phone] Hours of Operation:  9 am - 6 pm, M-F.  Also accepts Medicaid/Medicare and self-pay.  [facility] for [place], [address], [place] Phone: [phone], Fax: [phone]. Hours of Operation:  8:30 am - 5:30 pm, M-F.  Also accepts Medicaid and self-pay.  [facility] [place] [address], [place] Phone: [phone]   [place], [place], [place] [phone], Ext. [phone] Mondays & Thursdays: 7-9 AM.  First 15 patients are seen on a first come, first serve basis.    [place] Providers:  Organization         Address  Phone   Notes  [facility] [address], [place] [phone] Also accepts self-pay patients.  [facility] [id number] [place], [place]  [phone]   [place], [address], [place] [phone]   [facility] [address], [place] [phone]   Lucianne Lei [address], [place]   [phone] Only accepts [place] Access [place] patients after they have their name applied to their card.   Self-Pay (no insurance) in [facility]:  Organization         Address  Phone   Notes  Sickle Cell Patients, [place] [facility] Internal Medicine [place] [phone]   [facility] Urgent Care [place] [phone]   [facility] [place]  [place], [place],  [place] [phone]   [facility]/Dr. Osei-Bonsu  [address] [place] or [address], [place] [phone] Phone number for both [place] and [place] locations is the same.  Urgent Medical and [facility] [address] [place] [phone]   [facility] [address], [place] or [address] [phone]   [facility] [address] [place] [phone], phone; [phone], fax Sees patients 1st and 3rd Saturday of every month.  Must not qualify for public or private insurance (i.e. Medicaid, Medicare, [place]  Health Choice, Veterans' Benefits)  Household income should be no more than 200% of the poverty level The clinic cannot treat you if you are pregnant or think you are pregnant  Sexually transmitted diseases are not treated at the clinic.    Dental Care: Organization         Address  Phone  Notes  [facility] Department of [facility] [phone] Accepts children up to age 43 who are enrolled in [place] or [place]; pregnant women with a Medicaid card; and children who have applied for Medicaid or [place] Health Choice, but were declined, whose parents can pay a reduced fee at time of service.  [facility] Department of [facility]  [address], [place] [phone] Accepts children up to age 60 who are enrolled in [place] or [place]; pregnant women with a Medicaid card; and children who have applied for Medicaid or [place] Health Choice, but were declined, whose parents can pay a reduced fee at time of service.  [place] Adult Dental Access PROGRAM  [employer] [phone] Patients are seen by appointment only. Walk-ins are not accepted. [place] will see patients 79 years of age and older. Monday - Tuesday (8am-5pm) Most Wednesdays  (8:30-5pm) $30 per visit, cash only  [facility] Adult Dental Access PROGRAM  [address], [facility] [phone] Patients are seen by appointment only. Walk-ins are not accepted. [place] will see patients 33 years of age and older. One Wednesday Evening (Monthly: Volunteer Based).  $30 per visit, cash only  [place]  [phone] for adults; Children under age 22, call Graduate Pediatric Dentistry at [phone]. Children aged 64-14, please call [phone] to request a pediatric application.  Dental services are provided in all areas of dental care including fillings, crowns and bridges, complete and partial dentures, implants, gum treatment, root canals, and extractions. Preventive care is also provided. Treatment is provided to both adults and children. Patients are selected via a lottery and there is often a waiting list.   [facility] [address], [place]  [phone] [email or web address]   [facility] [address] [place], [place] [phone], Ext. [phone] Second and Fourth Thursday of each month, opens at 6:30 AM; Clinic ends at 9 AM.  Patients are seen on a first-come first-served basis, and a limited number are seen during each clinic.   [facility]  [address] [place] Danker [place], [place] [phone]   Eligibility Requirements You must have lived in [place], [place], or [place] counties for at least the last three months.   You cannot be eligible for state or federal sponsored [employer], including [employer], [place], or [employer].   You generally cannot be eligible for healthcare insurance through your employer.    How to apply: Eligibility screenings are held every Tuesday and Wednesday afternoon from 1:00 pm until 4:00 pm. You do not need an appointment for the interview!  [facility] [address], [place], [place]   [place]  [place] Department  [place]  [phone]    Behavioral Health Resources in the Community: Intensive Outpatient Programs Organization         Address  Phone  Notes  [place] [phone]  [address], [place], [place] [phone]   [facility] Outpatient [address] [place], [place]   ADS: Alcohol & Drug Svcs [address], [place], [place]   [place] [address]  [place], [place] or [phone]   Substance Abuse Resources Organization         Address  Phone  Notes  Alcohol and Drug Services  [phone]   [place]  [phone]   [facility] [place]   Chinita Pester  [phone]   Residential & Outpatient Substance Abuse Program  [phone]   Psychological Services Organization         Address  Phone  Notes  [facility] [place]  [place]  [phone]   [place] [address], [place] or [phone]    Mobile Crisis Teams Organization         Address  Phone  Notes  [facility]atives, Mobile Crisis Care Unit  [phone]   Assertive Psychotherapeutic Services  [address] [place], [place]   [place] [address] [place] [phone]    Self-Help/Support Groups Organization         Address  Phone             Notes  [place]. of [place] - variety of support groups  [place] Call for more information  Narcotics Anonymous (NA), [facility]aring [facility] [address], [employer] [place]  2 meetings at this location   Special educational needs teacher         Address  Phone  Notes  ASAP Residential Treatment [place],    [place]  [phone]   [place] [facility]  [address] [place] [id number], [place], [place]   [place], [place] [phone] Admissions: 8am-3pm M-F  Incentives Substance [place] [address]    [place], [place] [phone]   [facility] [address] [place], [place], [place]   [facility] [address],  [place], [place]   Insight Programs - Intensive Outpatient [address], [place], [place]   [facility] ([place].) [place].,  [place], [place] [phone] or [phone]   [facility] Services (RTS) [address] [place], [place] Accepts Medicaid  [facility] [place] [address]  [place] [phone] Substance Abuse/Addiction Treatment   [facility] Organization         Address  Phone  Notes  [facility]  [phone]   Domenic Schwab, PhD [address] Arlis [place], [place]   [phone] or [phone]   [place], [place] [phone]   [facility] [address] [place], [place] [phone] Insurance/Medicaid/sponsorship through [facility] and Families [address] [place]                                    [place], [place] [phone] [place] [address], [place] [phone]    Dr. Adele Schilder  [phone]   [facility] [place] Dept. 1) [address]  [place] 2) [place] 3)  [place] [address], [place] [phone]  [phone]   [place] [phone] or [phone] (After Hours)

## 2014-03-11 NOTE — ED Provider Notes (Signed)
Medical screening examination/treatment/procedure(s) were performed by non-physician practitioner and as supervising physician I was immediately available for consultation/collaboration.   EKG Interpretation   Date/Time:  Sunday March 10 2014 15:59:50 EDT Ventricular Rate:  66 PR Interval:  165 QRS Duration: 75 QT Interval:  374 QTC Calculation: 392 R Axis:   64 Text Interpretation:  Sinus rhythm Probable left atrial enlargement  Diffuse ST elevation, likely repol These elevations are not new compared  to 2013 Confirmed by Samy Ryner  MD, [place] ([id number]) on 03/10/2014 4:03:40 PM        Ephraim Hamburger, MD 03/11/14 1148

## 2014-03-12 ENCOUNTER — Encounter (INDEPENDENT_AMBULATORY_CARE_PROVIDER_SITE_OTHER): Payer: Self-pay | Admitting: General Surgery

## 2014-03-12 ENCOUNTER — Ambulatory Visit (INDEPENDENT_AMBULATORY_CARE_PROVIDER_SITE_OTHER): Payer: Medicare Other | Admitting: General Surgery

## 2014-03-12 VITALS — BP 122/80 | HR 74 | Temp 97.9°F | Resp 14 | Ht 67.0 in | Wt 182.0 lb

## 2014-03-12 DIAGNOSIS — M899 Disorder of bone, unspecified: Secondary | ICD-10-CM

## 2014-03-12 DIAGNOSIS — M898X1 Other specified disorders of bone, shoulder: Secondary | ICD-10-CM

## 2014-03-12 DIAGNOSIS — R590 Localized enlarged lymph nodes: Secondary | ICD-10-CM

## 2014-03-12 DIAGNOSIS — M949 Disorder of cartilage, unspecified: Secondary | ICD-10-CM

## 2014-03-12 DIAGNOSIS — R599 Enlarged lymph nodes, unspecified: Secondary | ICD-10-CM

## 2014-03-12 NOTE — Patient Instructions (Signed)
You have an enlarged lymph node that is deep behind your left clavicle. I cannot feel this on exam.  It is possible that it is pushing on nerves that might cause  the left neck pain.  It might be due to your sarcoidosis or there may be some other diagnosis.  Dr. Halford Chessman has asked for a tissue biopsy.  This is in an area that is not accessible to a Education officer, environmental.  You will be referred to a cardiothoracic surgeon to see if they have a way to get  this lymph node biopsied.

## 2014-03-12 NOTE — Progress Notes (Signed)
Patient ID: Rhonda Hester, female   DOB: [birth year], 38 y.o.   MRN: [id number]  Chief Complaint  Patient presents with  . Adenopathy    lt shoulder    HPI Rhonda Hester is a 38 y.o. female.  She is referred by Junius Creamer, NP at the emergency department for evaluation of left neck pain and a supraclavicular mass. Her pulmonologist is Dr. Halford Chessman.  She has a history of sarcoidosis and is followed by lumbar pulmonary medicine. She states this he developed left neck pain about 3 months ago and has been getting progressively worse. The pain is somewhat disrupting her sleep. Radiates from her lateral supraclavicular area upper left neck. It does not radiate down her arm. There is no exacerbation of pain with range of motion of the left shoulder. Denies trauma to her neck. She is not working and has no work-related injury.  She went to the emergency department on 03/04/2014. She was evaluated by one of the nurse practitioners, Junius Creamer .Marland KitchenMarland KitchenCosigned by Jaymes Graff. Audie Pinto.   I reviewed her CT scan with Dr. Sabino Dick, who read the film. He notes that she has densities in the upper lung fields consistent with her known sarcosis . The parotid glands and the oral pharyngeal airway area looks normal. There is a 19 mm mass which is actually deep and posterior to the clavicle and sitting on top, or just superior to the subclavian vein. He states that this is not accessible by interventional radiology.She went to the emergency department on 03/04/2014. She was evaluated by one of the nurse practitioners. Cosigned by Jaymes Graff. Audie Pinto.  She is frustrated because the pain has been progressive and it has been hurting so long. It is not clear whether the mass behind her clavicle is related to her sarcoidosis or another diagnosis. It is not clear whether it is causing her pain.  HPI  Past Medical History  Diagnosis Date  . Seasonal allergic rhinitis   . Eczema   . Interstitial cystitis   . Allergic asthma   .  Sarcoidosis of lung     DX 2006--  PULMOLOGIST--  DR XTKW  . Shortness of breath   . History of Bell's palsy     2006  RIGHT SIDE--  RESOLVED  . History of anal fissures   . History of gastroesophageal reflux (GERD)     Past Surgical History  Procedure Laterality Date  . Dilation and curettage of uterus  1997  . Cystoscopy/ hydrodistention/ bladder bx  06-09-2010  . Cysto with hydrodistension N/A 08/24/2013    Procedure: CYSTOSCOPY/HYDRODISTENSION with instillation of marcaine and pyridium;  Surgeon: Fredricka Bonine, MD;  Location: [facility];  Service: Urology;  Laterality: N/A;    Family History  Problem Relation Age of Onset  . Brain cancer Maternal Grandmother     Social History History  Substance Use Topics  . Smoking status: Current Some Day Smoker -- 0.30 packs/day for 6 years    Types: Cigarettes  . Smokeless tobacco: Never Used     Comment: ONE CIG. DAILY /  1 PPMONTH  . Alcohol Use: No    Allergies  Allergen Reactions  . Citric Acid Other (See Comments)    AVOIDS DUE TO INTERSTITIAL CYSTITIS  . Dust Mite Extract Itching and Other (See Comments)     coughing  . Pollen Extract Itching and Other (See Comments)     coughing  . Nickel Rash    Current Outpatient Prescriptions  Medication  Sig Dispense Refill  . albuterol (PROAIR HFA) 108 (90 BASE) MCG/ACT inhaler Inhale 2 puffs into the lungs every 6 (six) hours as needed for shortness of breath.  1 Inhaler  5  . HYDROcodone-acetaminophen (NORCO/VICODIN) 5-325 MG per tablet Take 1 tablet by mouth every 6 (six) hours as needed for moderate pain.      Marland Kitchen HYDROcodone-acetaminophen (NORCO/VICODIN) 5-325 MG per tablet Take 1 tablet by mouth every 6 (six) hours as needed.  7 tablet  0  . hydrocortisone valerate cream (WESTCORT) 0.2 % Apply 1 application topically 2 (two) times daily.      . hydrOXYzine (ATARAX/VISTARIL) 10 MG tablet Take 10 mg by mouth at bedtime as needed for itching.      .  mometasone-formoterol (DULERA) 200-5 MCG/ACT AERO Inhale 2 puffs into the lungs 2 (two) times daily.      . penicillin v potassium (VEETID) 500 MG tablet Take 500 mg by mouth 4 (four) times daily.      . polyethylene glycol (MIRALAX / GLYCOLAX) packet Take 17 g by mouth daily.       No current facility-administered medications for this visit.   Facility-Administered Medications Ordered in Other Visits  Medication Dose Route Frequency Provider Last Rate Last Dose  . bupivacaine (MARCAINE) 0.5 % 15 mL, phenazopyridine (PYRIDIUM) 400 mg bladder mixture   Bladder Instillation Once Fredricka Bonine, MD        Review of Systems Review of Systems  Constitutional: Negative for fever, chills and unexpected weight change.  HENT: Negative for congestion, hearing loss, sore throat, trouble swallowing and voice change.   Eyes: Negative for visual disturbance.  Respiratory: Negative for cough and wheezing.   Cardiovascular: Negative for chest pain, palpitations and leg swelling.  Gastrointestinal: Negative for nausea, vomiting, abdominal pain, diarrhea, constipation, blood in stool, abdominal distention and anal bleeding.  Genitourinary: Negative for hematuria, vaginal bleeding and difficulty urinating.  Musculoskeletal: Negative for arthralgias.  Skin: Negative for rash and wound.  Neurological: Negative for seizures, syncope and headaches.  Hematological: Negative for adenopathy. Does not bruise/bleed easily.  Psychiatric/Behavioral: Negative for confusion.    Blood pressure 122/80, pulse 74, temperature 97.9 F (36.6 C), temperature source Oral, resp. rate 14, height 5\' 7"  (1.702 m), weight 182 lb (82.555 kg).  Physical Exam Physical Exam  Constitutional: She is oriented to person, place, and time. She appears well-developed and well-nourished. No distress.  HENT:  Head: Normocephalic and atraumatic.  Nose: Nose normal.  Mouth/Throat: No oropharyngeal exudate.  Eyes: Conjunctivae and  EOM are normal. Pupils are equal, round, and reactive to light. Left eye exhibits no discharge. No scleral icterus.  Neck: Neck supple. No JVD present. No tracheal deviation present. No thyromegaly present.  She localizes her pain to the anterior border of the trapezius muscle all left and the soft tissue between that and the lateral third of the clavicle. There is no significant palpable adenopathy with particular attention being paid to the supraclavicular area and the anterior and posterior triangles of the neck. Trachea is midline.  Cardiovascular: Normal rate, regular rhythm, normal heart sounds and intact distal pulses.   No murmur heard. Pulmonary/Chest: Effort normal and breath sounds normal. No respiratory distress. She has no wheezes. She has no rales. She exhibits no tenderness.  Abdominal: Soft. Bowel sounds are normal. She exhibits no distension and no mass. There is no tenderness. There is no rebound and no guarding.  Musculoskeletal: She exhibits no edema and no tenderness.  Lymphadenopathy:  She has no cervical adenopathy.  Neurological: She is alert and oriented to person, place, and time. She exhibits normal muscle tone. Coordination normal.  Skin: Skin is warm. No rash noted. She is not diaphoretic. No erythema. No pallor.  Psychiatric: She has a normal mood and affect. Her behavior is normal. Judgment and thought content normal.    Data Reviewed Emergency department notes. Nurse notes from [place]. CT scan. Phone conversation with Dr. Nyoka Cowden in radiology  Assessment    Left neck pain. Etiology unclear, possibly related to sarcoidosis  19 mm retroclavicular mass. Adenopathy suggested. Sarcoidosis versus other diagnoses. Tissue diagnosis requested by Dr. Halford Chessman of [facility]  Known sarcoidosis  Interstitial cystitis  Allergic asthma  GERD     Plan    The retroclavicular mass is nonpalpable and is not accessible by general surgical  techniques.  To obtain a tissue diagnosis, the patient be referred to cardiothoracic surgery to see if they can access this area safely.        Chosen Garron M 03/12/2014, 5:02 PM

## 2014-03-14 ENCOUNTER — Institutional Professional Consult (permissible substitution) (INDEPENDENT_AMBULATORY_CARE_PROVIDER_SITE_OTHER): Payer: Medicare Other | Admitting: Thoracic Surgery (Cardiothoracic Vascular Surgery)

## 2014-03-14 ENCOUNTER — Other Ambulatory Visit: Payer: Self-pay | Admitting: *Deleted

## 2014-03-14 ENCOUNTER — Encounter: Payer: Self-pay | Admitting: Thoracic Surgery (Cardiothoracic Vascular Surgery)

## 2014-03-14 VITALS — BP 103/75 | HR 75 | Resp 20 | Ht 67.0 in | Wt 182.0 lb

## 2014-03-14 DIAGNOSIS — D869 Sarcoidosis, unspecified: Secondary | ICD-10-CM

## 2014-03-14 DIAGNOSIS — R599 Enlarged lymph nodes, unspecified: Secondary | ICD-10-CM

## 2014-03-14 DIAGNOSIS — D860 Sarcoidosis of lung: Secondary | ICD-10-CM | POA: Insufficient documentation

## 2014-03-14 DIAGNOSIS — J99 Respiratory disorders in diseases classified elsewhere: Secondary | ICD-10-CM

## 2014-03-14 MED ORDER — PREDNISONE 20 MG PO TABS: 40.0000 mg | ORAL_TABLET | Freq: Every day | ORAL | Status: DC

## 2014-03-14 NOTE — Progress Notes (Signed)
PCP is Default, Provider, MD Referring Provider is No ref. provider found  Chief Complaint  Patient presents with  . Adenopathy    Surgical eval on cervical adenopathy, CT of neck 03/04/14    HPI: 38 year old woman sent for consultation regarding left supraclavicular lymph node.  Rhonda Hester is a 38 year old woman with a history of sarcoidosis diagnosed in 2006. She has a chief complaint of left shoulder and neck pain which dates back to January of this year. She says that she just woke up one morning with the pain and it has been persistent. She describes this as a constant aching and throbbing pain. She has tried nonsteroidals without any improvement. She did have some relief with Vicodin but was only given 7 tablets and has used them all.  She complains of chills and hot and cold intolerance, but has not had any fevers or night sweats. She says that she gets short of breath with exertion such as walking up stairs. She also has some shortness of breath at night lying on her back. She has a Dulera inhaler but does not use it on regular basis. She says that he sometimes has nausea when she gets up in the mornings but it denies any chance that she could be pregnant. She denies weight loss.  She has been to the emergency room for this pain a couple of occasions most recently on March 9. She had a CT of the neck done at that time. Showed changes consistent with sarcoid in the apices of the lungs. There also was a 1.9 x 1.3 x 1.1 cm left supraclavicular node. There was no mention of any abnormalities with the cervical spine.     Past Medical History  Diagnosis Date  . Seasonal allergic rhinitis   . Eczema   . Interstitial cystitis   . Allergic asthma   . Sarcoidosis of lung     DX 2006--  PULMOLOGIST--  DR WUJW  . Shortness of breath   . History of Bell's palsy     2006  RIGHT SIDE--  RESOLVED  . History of anal fissures   . History of gastroesophageal reflux (GERD)     Past Surgical  History  Procedure Laterality Date  . Dilation and curettage of uterus  1997  . Cystoscopy/ hydrodistention/ bladder bx  06-09-2010  . Cysto with hydrodistension N/A 08/24/2013    Procedure: CYSTOSCOPY/HYDRODISTENSION with instillation of marcaine and pyridium;  Surgeon: Fredricka Bonine, MD;  Location: [facility]  Service: Urology;  Laterality: N/A;    Family History  Problem Relation Age of Onset  . Brain cancer Maternal Grandmother     Social History History  Substance Use Topics  . Smoking status: Current Some Day Smoker -- 0.30 packs/day for 6 years    Types: Cigarettes  . Smokeless tobacco: Never Used     Comment: ONE CIG. DAILY /  1 PPMONTH  . Alcohol Use: No    Current Outpatient Prescriptions  Medication Sig Dispense Refill  . albuterol (PROAIR HFA) 108 (90 BASE) MCG/ACT inhaler Inhale 2 puffs into the lungs every 6 (six) hours as needed for shortness of breath.  1 Inhaler  5  . HYDROcodone-acetaminophen (NORCO/VICODIN) 5-325 MG per tablet Take 1 tablet by mouth every 6 (six) hours as needed.  7 tablet  0  . hydrocortisone valerate cream (WESTCORT) 0.2 % Apply 1 application topically 2 (two) times daily.      . hydrOXYzine (ATARAX/VISTARIL) 10 MG tablet Take 10  mg by mouth at bedtime as needed for itching.      . mometasone-formoterol (DULERA) 200-5 MCG/ACT AERO Inhale 2 puffs into the lungs 2 (two) times daily.      . penicillin v potassium (VEETID) 500 MG tablet Take 500 mg by mouth 4 (four) times daily.      . polyethylene glycol (MIRALAX / GLYCOLAX) packet Take 17 g by mouth daily.      . predniSONE (DELTASONE) 20 MG tablet Take 2 tablets (40 mg total) by mouth daily with breakfast.  20 tablet  1   No current facility-administered medications for this visit.   Facility-Administered Medications Ordered in Other Visits  Medication Dose Route Frequency Provider Last Rate Last Dose  . bupivacaine (MARCAINE) 0.5 % 15 mL, phenazopyridine (PYRIDIUM)  400 mg bladder mixture   Bladder Instillation Once Fredricka Bonine, MD        Allergies  Allergen Reactions  . Citric Acid Other (See Comments)    AVOIDS DUE TO INTERSTITIAL CYSTITIS  . Dust Mite Extract Itching and Other (See Comments)     coughing  . Pollen Extract Itching and Other (See Comments)     coughing  . Nickel Rash    Review of Systems  Constitutional: Positive for chills. Negative for fever.  HENT:       Filling came out of tooth- on penicillin  Respiratory: Positive for cough and shortness of breath (with exertion, + orthopnea).   Gastrointestinal: Positive for nausea.  Musculoskeletal: Positive for arthralgias, myalgias and neck pain.       Right shoulder and neck pain  Allergic/Immunologic: Positive for environmental allergies.  All other systems reviewed and are negative.    BP 103/75  Pulse 75  Resp 20  Ht 5\' 7"  (1.702 m)  Wt 182 lb (82.555 kg)  BMI 28.50 kg/m2  SpO2 97% Physical Exam  Vitals reviewed. Constitutional: She is oriented to person, place, and time. She appears well-developed and well-nourished. No distress.  HENT:  Head: Normocephalic and atraumatic.  Eyes: EOM are normal. Pupils are equal, round, and reactive to light.  Neck: Neck supple. No thyromegaly present.  Cardiovascular: Normal rate, regular rhythm and normal heart sounds.  Exam reveals no gallop and no friction rub.   No murmur heard. Pulmonary/Chest: Effort normal. She has no wheezes. She has no rales.  Abdominal: Soft. There is no tenderness.  Musculoskeletal: Normal range of motion.  Can lift left arm above head but does so gingerly. Strength not tested.  Tender to palpation of trapezius and posterior sternocleidomastoid. Fullness noted left supraclavicular fossa but no discrete mass palpable in region of LN  Lymphadenopathy:    She has no cervical adenopathy.  Neurological: She is alert and oriented to person, place, and time. No cranial nerve deficit.  Skin:  Skin is warm and dry.     Diagnostic Tests: CT NECK 3/9 /2015 ADDENDUM:  The enlarged lymph node that is described in the main body and  conclusion of the report is actually more retroclavicular in  position, and is superior to the left subclavian vein.  Electronically Signed  By: Sabino Dick M.D.  On: 03/12/2014 16:51       Study Result    CLINICAL DATA: Neck pain, left shoulder swelling. History of  sarcoidosis.  EXAM:  CT NECK WITH CONTRAST  TECHNIQUE:  Multidetector CT imaging of the neck was performed using the  standard protocol following the bolus administration of intravenous  contrast.  CONTRAST: 150mL OMNIPAQUE IOHEXOL  300 MG/ML SOLN  COMPARISON: CT scan of head of October 06, 2004.  FINDINGS:  Interstitial densities are noted in the visualized portions of both  upper lobes consistent with history of sarcoidosis. Epiglottis  appears normal. Thyroid gland and larynx appear normal. Paranasal  sinuses appear normal. Parapharyngeal spaces appear normal. Parotid  glands appear normal bilaterally. No abnormal fluid collection is  noted. Visualized vasculature appears normal. Enlarged lymph node  measuring 19 x 14 mm is noted in left supraclavicular region. Airway  appears patent. Globes and orbits appear normal.  IMPRESSION:  Interstitial densities are noted in the visualized portions of both  upper lobes consistent with history of sarcoidosis.  Enlarged left supraclavicular lymph node measuring 19 x 14 mm is  noted which may be related to patient's history of sarcoidosis, but  neoplasm cannot be excluded.  Electronically Signed:  By: Sabino Dick M.D.  On:  03/04/2014 23:42     Impression: 38 year old woman with left shoulder and neck pain for about 6-8 weeks now. Workup revealed a moderately enlarged left supraclavicular lymph node. She has a history of sarcoidosis dating back to 2006. She has not been on any active treatment for that. She does have a bilirubin  inhaler but only uses it sporadically.   My concern is that her pain does not seem to be related to the adenopathy at all. The pain is more with PDS in the posterior aspect of the sternocleidomastoid. She really has no pain or tenderness to palpation at the clavicle or scalene node area. I think it is highly unlikely that the node in that location is causing the pain where she is experiencing it.  I discussed our options with Rhonda Hester and her mother. One option would be to go ahead and do a supraclavicular node biopsy under local. I am not sure why radiology feels they cannot do a needle of this lesion, but with lymphoma in the differential, it would probably be better to do a excisional biopsy anyway. This could be done under local anesthesia with intravenous sedation on an outpatient basis. I made it clear that I do not think a biopsy will do anything to relieve her pain. The second option given her history of sarcoidosis would be to treat her with prednisone to see if that makes any difference either in pain or the size of the node. The downside to that option is that if this were to turn out to be some other type of malignancy he could delay diagnosis. She is also reluctant to try prednisone due to side effects she had when she was previously on it.  After discussion Rhonda Hester was frustrated by the fact that I was not offering a resolution to her pain. She reluctantly wishes to try prednisone. I think we need to give a CT of the chest to see if there is any other adenopathy. In the meantime on him start her on prednisone 40 mg a day and plan to see her back in 10 days to check on her progress and review the CT.   Plan: Prednisone 40 mg daily  CT of chest to assess for adenopathy  Return in 10 days to review CT of chest

## 2014-04-02 ENCOUNTER — Ambulatory Visit
Admission: RE | Admit: 2014-04-02 | Discharge: 2014-04-02 | Disposition: A | Discharge status: Home / Self Care | Payer: Medicare Other | Source: Ambulatory Visit | Attending: Thoracic Surgery (Cardiothoracic Vascular Surgery) | Admitting: Thoracic Surgery (Cardiothoracic Vascular Surgery)

## 2014-04-02 ENCOUNTER — Ambulatory Visit: Payer: Medicare Other | Admitting: Thoracic Surgery (Cardiothoracic Vascular Surgery)

## 2014-04-02 DIAGNOSIS — D869 Sarcoidosis, unspecified: Secondary | ICD-10-CM

## 2014-04-02 MED ORDER — IOHEXOL 300 MG/ML  SOLN
75.0000 mL | Freq: Once | INTRAMUSCULAR | Status: AC | PRN
  Administered 2014-04-02: 75 mL via INTRAVENOUS

## 2014-04-09 ENCOUNTER — Ambulatory Visit (INDEPENDENT_AMBULATORY_CARE_PROVIDER_SITE_OTHER): Payer: Medicare Other | Admitting: Thoracic Surgery (Cardiothoracic Vascular Surgery)

## 2014-04-09 ENCOUNTER — Encounter: Payer: Self-pay | Admitting: Thoracic Surgery (Cardiothoracic Vascular Surgery)

## 2014-04-09 VITALS — BP 113/72 | HR 69 | Resp 16 | Ht 67.0 in | Wt 182.0 lb

## 2014-04-09 DIAGNOSIS — D869 Sarcoidosis, unspecified: Secondary | ICD-10-CM

## 2014-04-09 DIAGNOSIS — R599 Enlarged lymph nodes, unspecified: Secondary | ICD-10-CM

## 2014-04-09 NOTE — Patient Instructions (Signed)
Take 1 prednisone tablet (20 mg) twice daily for 1 week then take 1 tablet daily until they are all gone  Follow up with Dr. Halford Chessman to see if any additional treatment for sarcoid is necessary  Return in 3 months with repeat CT

## 2014-04-09 NOTE — Progress Notes (Signed)
HPI:  Rhonda Hester is a 38 year old woman with a history of sarcoidosis, who has been bothered by left-sided neck and shoulder pain recently. She had multiple visits to the emergency room. On one of her visits a CT of the neck was done. It showed a rather prominent the left retroclavicular lymph node.  The node was not easily palpable on exam. Given her history of sarcoidosis of a trial of steroids would be reasonable. We also ordered a chest CT. She did note significant improvement in her pain shortly after starting the steroids. She does still have some pain in that area but tends to be off and on rather than constant, and it is not nearly as severe rating 2-3/10 versus 10 out of 10 previously.  We have prescribed 40 mg of prednisone daily, but she apparently was taking 40 mg twice daily. She was supposed to a followed up after 10 days, but missed that appointment. She has not yet gotten her refill of prednisone.  Past Medical History  Diagnosis Date  . Seasonal allergic rhinitis   . Eczema   . Interstitial cystitis   . Allergic asthma   . Sarcoidosis of lung     DX 2006--  PULMOLOGIST--  DR ZOXW  . Shortness of breath   . History of Bell's palsy     2006  RIGHT SIDE--  RESOLVED  . History of anal fissures   . History of gastroesophageal reflux (GERD)       Current Outpatient Prescriptions  Medication Sig Dispense Refill  . albuterol (PROAIR HFA) 108 (90 BASE) MCG/ACT inhaler Inhale 2 puffs into the lungs every 6 (six) hours as needed for shortness of breath.  1 Inhaler  5  . HYDROcodone-acetaminophen (NORCO/VICODIN) 5-325 MG per tablet Take 1 tablet by mouth every 6 (six) hours as needed.  7 tablet  0  . hydrocortisone valerate cream (WESTCORT) 0.2 % Apply 1 application topically 2 (two) times daily.      . hydrOXYzine (ATARAX/VISTARIL) 10 MG tablet Take 10 mg by mouth at bedtime as needed for itching.      . mometasone-formoterol (DULERA) 200-5 MCG/ACT AERO Inhale 2 puffs into  the lungs 2 (two) times daily.      . penicillin v potassium (VEETID) 500 MG tablet Take 500 mg by mouth 4 (four) times daily.      . polyethylene glycol (MIRALAX / GLYCOLAX) packet Take 17 g by mouth daily.      . predniSONE (DELTASONE) 20 MG tablet Take 2 tablets (40 mg total) by mouth daily with breakfast.  20 tablet  1   No current facility-administered medications for this visit.   Facility-Administered Medications Ordered in Other Visits  Medication Dose Route Frequency Provider Last Rate Last Dose  . bupivacaine (MARCAINE) 0.5 % 15 mL, phenazopyridine (PYRIDIUM) 400 mg bladder mixture   Bladder Instillation Once Fredricka Bonine, MD        Physical Exam BP 113/72  Pulse 69  Resp 16  Ht 5\' 7"  (1.702 m)  Wt 182 lb (82.555 kg)  BMI 28.50 kg/m2  SpO2 99%  LMP 03/18/2014 38 year old woman in no acute distress Well-developed well-nourished No palpable supraclavicular mass Mild tenderness along the trapezius  Diagnostic Tests: CT chest 04/02/2014 CT CHEST WITH CONTRAST  TECHNIQUE:  Multidetector CT imaging of the chest was performed during  intravenous contrast administration.  CONTRAST: 23mL OMNIPAQUE IOHEXOL 300 MG/ML SOLN IV  COMPARISON: 11/20/2013, 07/08/2008  FINDINGS:  Thoracic vascular structures grossly normal appearance.  Visualized portion of upper abdomen normal appearance.  Calcified right hilar lymph nodes.  No thoracic adenopathy.  Scattered areas of parenchymal scarring in both lungs consistent  with history of sarcoidosis.  Some of these areas demonstrate mild stable soft tissue attenuation.  No evidence of enlarging pulmonary mass/nodule.  No acute infiltrate, pleural effusion, or pneumothorax.  Osseous structures unremarkable.  IMPRESSION:  Scattered parenchymal lung scarring and calcified right hilar  adenopathy consistent with history of sarcoidosis.  No acute intrathoracic abnormalities.  Electronically Signed  By: Lavonia Dana M.D.  On:  04/02/2014 10:51   Impression: 38 year old woman with left neck and shoulder pain. She was found on CT to have an enlarged node behind her left clavicle. The node was otherwise normal in appearance with smooth margins and a normal shape. Her pain seemed more consistent with musculoskeletal origin, but was not responding to pain medication. We elected to give her a trial of prednisone. That resulted in marked improvement of her pain. She then had a CT of the chest last week. It showed evidence of old treated sarcoidosis. The radiologist made no mention of the supraclavicular node. In reviewing the films there is some variability of the appearance of the anatomy due to her arms being raised in one of the CTs and at her side during the other. However, if anything the node appears to be smaller than it had been in March and certainly no larger.  Is unclear if this enlarged node was to do sarcoidosis or reactive to some other process in the neck. It also is unclear if the prednisone helped because the node get smaller or because of its anti-inflammatory effects.  I recommended that she refill the prednisone and take 1 tablet by mouth twice daily for a week and then take 1 tablet daily until they're gone. In the meantime I recommended that she followup with Dr. Halford Chessman who has been managing her sarcoid to see if she needs to remain on steroids be on that time.  Plan: Return in 3 months with CT of chest to followup left supraclavicular lymph node.

## 2014-04-29 ENCOUNTER — Ambulatory Visit (INDEPENDENT_AMBULATORY_CARE_PROVIDER_SITE_OTHER): Payer: Medicare Other | Admitting: Pulmonary Disease

## 2014-04-29 ENCOUNTER — Encounter: Payer: Self-pay | Admitting: Pulmonary Disease

## 2014-04-29 VITALS — BP 118/70 | HR 80 | Ht 66.0 in | Wt 193.2 lb

## 2014-04-29 DIAGNOSIS — D869 Sarcoidosis, unspecified: Secondary | ICD-10-CM

## 2014-04-29 DIAGNOSIS — J99 Respiratory disorders in diseases classified elsewhere: Secondary | ICD-10-CM

## 2014-04-29 DIAGNOSIS — J45909 Unspecified asthma, uncomplicated: Secondary | ICD-10-CM

## 2014-04-29 DIAGNOSIS — D860 Sarcoidosis of lung: Secondary | ICD-10-CM

## 2014-04-29 MED ORDER — AEROCHAMBER MV MISC: Status: DC

## 2014-04-29 MED ORDER — PREDNISONE 10 MG PO TABS: ORAL_TABLET | ORAL | Status: DC

## 2014-04-29 NOTE — Patient Instructions (Signed)
Dulera two puffs twice per day >> rinse mouth after each use Prednisone 10 mg pill >> 1.5 pills daily for 2 weeks, then 1 pill daily for 2 weeks, then 1/2 pill daily for 2 weeks, then stop prednisone Follow up in 8 weeks

## 2014-04-29 NOTE — Assessment & Plan Note (Signed)
Explained importance of complete smoking cessation, and offered assistance with this.       

## 2014-04-29 NOTE — Assessment & Plan Note (Signed)
I am still not convinced her symptoms of Lt should pain are related to her sarcoid.  Will gradually wean off prednisone as tolerated.  If her pain symptoms recur/persist, then she may need MRI of C and T spine and Lt shoulder region.

## 2014-04-29 NOTE — Assessment & Plan Note (Signed)
She is to continue dulera and albuterol.  Will have her use spacer device.

## 2014-04-29 NOTE — Progress Notes (Signed)
Chief Complaint  Patient presents with  . Follow-up    f/u per Dr Roxan Hockey - increased SOB for past 2 weeks - On pred 20mg /day now - Diff sleeping due to SOB - Chrest feesl heavy - Prod cough (brown) - HAd chest CT on 45/7/15 in EMR    History of Present Illness: Rhonda Hester is a 38 y.o. female smoker with asthma, allergic rhinitis, and sarcoidosis (pulmonary and skin involvement).  Since last visit she was seen by [facility].  She was put on prednisone.  This helped some, but she still has pain.  This is in left shoulder, and left axilla.  She is on prednisone 20 mg daily.    She is not using dulera >> she thought she was supposed to stop this.  She is using proair, but this makes her cough.  She continues to smoke.  She brings up brown to green sputum.  She denies fever, hemoptysis.  She has noticed being more fatigued, and concerned about weight gain.  TESTS: CT chest 12/30/04 >> b/l hilar and mediastinal adenopathy with b/l nodular interstitial opacities  CT chest 07/08/08 >> decreased lymph nodes, scarring with volume loss  RAST 07/22/11 >> Multiple allergens, IgE 71.8  PFT 09/02/11 >> FEV1 2.80(93%), FEV1% 82, TLC 4.74(89%), DLCO 59%, DLCO/VA 95%, no BD response.  PFT 09/25/13 >> FEV1 2.75 (95%), FEV1% 81, TLC 4.25 (77%), DLCO 64%, no BD response. CT chest 11/20/13 >>  Scattered areas of ATX CT chest 04/02/14 >> calcified Rt hilar LAN, scattered areas of scarring   Rhonda Hester  has a past medical history of Seasonal allergic rhinitis; Eczema; Interstitial cystitis; Allergic asthma; Sarcoidosis of lung; Shortness of breath; History of Bell's palsy; History of anal fissures; and History of gastroesophageal reflux (GERD).  Rhonda Hester  has past surgical history that includes Dilation and curettage of uterus (1997); CYSTOSCOPY/ HYDRODISTENTION/ BLADDER BX (06-09-2010); and cysto with hydrodistension (N/A, 08/24/2013).  Prior to Admission medications   Medication Sig  Start Date End Date Taking? Authorizing Provider  albuterol (PROAIR HFA) 108 (90 BASE) MCG/ACT inhaler Inhale 2 puffs into the lungs every 6 (six) hours as needed for shortness of breath. 11/19/13  Yes Chesley Mires, MD  diazepam (VALIUM) 5 MG tablet Take 1 tablet (5 mg total) by mouth every 8 (eight) hours as needed for anxiety. 01/20/14  Yes Garald Balding, NP  diclofenac (CATAFLAM) 50 MG tablet Take 1 tablet (50 mg total) by mouth 3 (three) times daily. 01/10/14  Yes Janne Napoleon, NP  mometasone-formoterol (DULERA) 200-5 MCG/ACT AERO Inhale 2 puffs into the lungs 2 (two) times daily. Take 2 puffs first thing in am and then another 2 puffs about 12 hours later. 09/25/13  Yes Tanda Rockers, MD  naproxen (NAPROSYN) 375 MG tablet Take 1 tablet (375 mg total) by mouth 2 (two) times daily with a meal. 01/20/14  Yes Garald Balding, NP  traMADol (ULTRAM) 50 MG tablet Take 1 tablet (50 mg total) by mouth every 6 (six) hours as needed. 01/10/14  Yes Janne Napoleon, NP    Allergies  Allergen Reactions  . Citric Acid Other (See Comments)    AVOIDS DUE TO INTERSTITIAL CYSTITIS  . Dust Mite Extract Itching and Other (See Comments)     coughing  . Pollen Extract Itching and Other (See Comments)     coughing  . Nickel Rash     Physical Exam:  General - No distress ENT - No sinus tenderness, no oral exudate, no LAN  Cardiac - s1s2 regular, no murmur Chest - No wheeze/rales/dullness Back - No focal tenderness Abd - Soft, non-tender Ext - no edema Neuro - Normal strength Skin - healed lesion on Lt medial knee Psych - normal mood, and behavior   Assessment/Plan:  Chesley Mires, MD [facility] Pager:  [phone]

## 2014-06-07 ENCOUNTER — Other Ambulatory Visit (HOSPITAL_COMMUNITY)
Admission: RE | Admit: 2014-06-07 | Discharge: 2014-06-07 | Disposition: A | Discharge status: Home / Self Care | Payer: Medicare Other | Source: Ambulatory Visit | Attending: Emergency Medicine | Admitting: Emergency Medicine

## 2014-06-07 ENCOUNTER — Encounter (HOSPITAL_COMMUNITY): Payer: Self-pay | Admitting: Emergency Medicine

## 2014-06-07 ENCOUNTER — Other Ambulatory Visit: Payer: Self-pay | Admitting: *Deleted

## 2014-06-07 ENCOUNTER — Emergency Department (INDEPENDENT_AMBULATORY_CARE_PROVIDER_SITE_OTHER)
Admission: EM | Admit: 2014-06-07 | Discharge: 2014-06-07 | Disposition: A | Discharge status: Home / Self Care | Payer: Medicare Other | Source: Home / Self Care | Attending: Family Medicine | Admitting: Family Medicine

## 2014-06-07 DIAGNOSIS — N760 Acute vaginitis: Secondary | ICD-10-CM | POA: Insufficient documentation

## 2014-06-07 DIAGNOSIS — Z8742 Personal history of other diseases of the female genital tract: Secondary | ICD-10-CM

## 2014-06-07 DIAGNOSIS — Z113 Encounter for screening for infections with a predominantly sexual mode of transmission: Secondary | ICD-10-CM | POA: Insufficient documentation

## 2014-06-07 DIAGNOSIS — R109 Unspecified abdominal pain: Secondary | ICD-10-CM

## 2014-06-07 LAB — POCT URINALYSIS DIP (DEVICE)
Bilirubin Urine: NEGATIVE
GLUCOSE, UA: NEGATIVE mg/dL
Hgb urine dipstick: NEGATIVE
Ketones, ur: NEGATIVE mg/dL
Nitrite: NEGATIVE
Protein, ur: NEGATIVE mg/dL
Specific Gravity, Urine: 1.02 (ref 1.005–1.030)
Urobilinogen, UA: 0.2 mg/dL (ref 0.0–1.0)
pH: 7 (ref 5.0–8.0)

## 2014-06-07 LAB — POCT PREGNANCY, URINE: PREG TEST UR: NEGATIVE

## 2014-06-07 MED ORDER — HYDROCODONE-ACETAMINOPHEN 5-325 MG PO TABS: 1.0000 | ORAL_TABLET | ORAL | Status: DC | PRN

## 2014-06-07 NOTE — ED Provider Notes (Addendum)
CSN: [id number]     Arrival date & time 06/07/14  1400 History   First MD Initiated Contact with Patient 06/07/14 1548     Chief Complaint  Patient presents with  . Abdominal Pain   (Consider location/radiation/quality/duration/timing/severity/associated sxs/prior Treatment) HPI Comments: 38 year old female presents for evaluation of right lower quadrant abdominal/pelvic pain. This started 2 days ago and has gotten progressively worse. The pain is constant and is worse with any movement. She has had a similar episode in the past that was associated with an ovarian cyst. She denies any fever, chills, nausea, vomiting, vaginal bleeding, vaginal discharge. She is not sexually active.  Patient is a 38 y.o. female presenting with abdominal pain.  Abdominal Pain   Past Medical History  Diagnosis Date  . Seasonal allergic rhinitis   . Eczema   . Interstitial cystitis   . Allergic asthma   . Sarcoidosis of lung     DX 2006--  PULMOLOGIST--  DR YTKZ  . Shortness of breath   . History of Bell's palsy     2006  RIGHT SIDE--  RESOLVED  . History of anal fissures   . History of gastroesophageal reflux (GERD)    Past Surgical History  Procedure Laterality Date  . Dilation and curettage of uterus  1997  . Cystoscopy/ hydrodistention/ bladder bx  06-09-2010  . Cysto with hydrodistension N/A 08/24/2013    Procedure: CYSTOSCOPY/HYDRODISTENSION with instillation of marcaine and pyridium;  Surgeon: Fredricka Bonine, MD;  Location: [facility];  Service: Urology;  Laterality: N/A;   Family History  Problem Relation Age of Onset  . Brain cancer Maternal Grandmother    History  Substance Use Topics  . Smoking status: Current Some Day Smoker -- 0.30 packs/day for 6 years    Types: Cigarettes  . Smokeless tobacco: Never Used     Comment: ONE CIG. DAILY /  1 PPMONTH  . Alcohol Use: No   OB History   Grav Para Term Preterm Abortions TAB SAB Ect Mult Living   1 0 0 0 1 0 1 0  0 0     Review of Systems  Gastrointestinal: Positive for abdominal pain.  Genitourinary: Positive for pelvic pain.  All other systems reviewed and are negative.   Allergies  Citric acid; Dust mite extract; Pollen extract; and Nickel  Home Medications   Prior to Admission medications   Medication Sig Start Date End Date Taking? Authorizing Provider  albuterol (PROAIR HFA) 108 (90 BASE) MCG/ACT inhaler Inhale 2 puffs into the lungs every 6 (six) hours as needed for shortness of breath. 11/19/13   Chesley Mires, MD  HYDROcodone-acetaminophen (NORCO) 5-325 MG per tablet Take 1 tablet by mouth every 4 (four) hours as needed for moderate pain. 06/07/14   Liam Graham, PA-C  HYDROcodone-acetaminophen (NORCO/VICODIN) 5-325 MG per tablet Take 1 tablet by mouth every 6 (six) hours as needed. 03/10/14   Marissa Sciacca, PA-C  hydrocortisone valerate cream (WESTCORT) 0.2 % Apply 1 application topically 2 (two) times daily.    Historical Provider, MD  hydrOXYzine (ATARAX/VISTARIL) 10 MG tablet Take 10 mg by mouth at bedtime as needed for itching.    Historical Provider, MD  mometasone-formoterol (DULERA) 200-5 MCG/ACT AERO Inhale 2 puffs into the lungs 2 (two) times daily.    Historical Provider, MD  polyethylene glycol (MIRALAX / GLYCOLAX) packet Take 17 g by mouth daily.    Historical Provider, MD  predniSONE (DELTASONE) 10 MG tablet 1.5 pills daily for 2  weeks, then 1 pill daily for 2 weeks, then 1/2 pill daily for 2 weeks 04/29/14   Chesley Mires, MD  predniSONE (DELTASONE) 20 MG tablet Take 20 mg by mouth daily with breakfast. 03/14/14   Melrose Nakayama, MD  Spacer/Aero-Holding Chambers (AEROCHAMBER MV) inhaler Use as instructed 04/29/14   Chesley Mires, MD   There were no vitals taken for this visit. Physical Exam  Nursing note and vitals reviewed. Constitutional: She is oriented to person, place, and time. Vital signs are normal. She appears well-developed and well-nourished. No distress.  HENT:   Head: Normocephalic and atraumatic.  Cardiovascular: Normal rate, regular rhythm and normal heart sounds.   Pulmonary/Chest: Effort normal and breath sounds normal. No respiratory distress.  Abdominal: Soft. She exhibits no mass. There is no hepatosplenomegaly. There is tenderness in the right lower quadrant. There is no rigidity, no rebound, no guarding, no CVA tenderness, no tenderness at McBurney's point and negative Murphy's sign.    Genitourinary: Vagina normal and uterus normal. There is no tenderness or lesion on the right labia. There is no tenderness or lesion on the left labia. Cervix exhibits no motion tenderness. Right adnexum displays tenderness and fullness. Right adnexum displays no mass. Left adnexum displays no mass, no tenderness and no fullness.  Lymphadenopathy:       Right: No inguinal adenopathy present.       Left: No inguinal adenopathy present.  Neurological: She is alert and oriented to person, place, and time. She has normal strength. Coordination normal.  Skin: Skin is warm and dry. No rash noted. She is not diaphoretic.  Psychiatric: She has a normal mood and affect. Judgment normal.    ED Course  Procedures (including critical care time) Labs Review Labs Reviewed  POCT URINALYSIS DIP (DEVICE) - Abnormal; Notable for the following:    Leukocytes, UA TRACE (*)    All other components within normal limits  POCT PREGNANCY, URINE    Imaging Review No results found.   MDM   1. Abdominal pain   2. History of ovarian cyst    Case discussed with the attending Dr. Jake Michaelis. We will discharge this patient was a medication take as needed with instructions to go to the emergency department if worsening pain, or she develops fever, nausea, vomiting. Otherwise followup with GYN at the beginning of the week.   Meds ordered this encounter  Medications  . HYDROcodone-acetaminophen (NORCO) 5-325 MG per tablet    Sig: Take 1 tablet by mouth every 4 (four) hours as  needed for moderate pain.    Dispense:  20 tablet    Refill:  0    Order Specific Question:  Supervising Provider    Answer:  Jake Michaelis, DAVID C [[id number]]    Liam Graham, PA-C 06/07/14 1649   Affirm came back positive for BV, metronidazole sent in to pharmacy.  Still waiting on STD tests   Liam Graham, PA-C 06/10/14 1527

## 2014-06-07 NOTE — Discharge Instructions (Signed)
Abdominal Pain, Women °Abdominal (stomach, pelvic, or belly) pain can be caused by many things. It is important to tell your doctor: °· The location of the pain. °· Does it come and go or is it present all the time? °· Are there things that start the pain (eating certain foods, exercise)? °· Are there other symptoms associated with the pain (fever, nausea, vomiting, diarrhea)? °All of this is helpful to know when trying to find the cause of the pain. °CAUSES  °· Stomach: virus or bacteria infection, or ulcer. °· Intestine: appendicitis (inflamed appendix), regional ileitis (Crohn's disease), ulcerative colitis (inflamed colon), irritable bowel syndrome, diverticulitis (inflamed diverticulum of the colon), or cancer of the stomach or intestine. °· Gallbladder disease or stones in the gallbladder. °· Kidney disease, kidney stones, or infection. °· Pancreas infection or cancer. °· Fibromyalgia (pain disorder). °· Diseases of the female organs: °· Uterus: fibroid (non-cancerous) tumors or infection. °· Fallopian tubes: infection or tubal pregnancy. °· Ovary: cysts or tumors. °· Pelvic adhesions (scar tissue). °· Endometriosis (uterus lining tissue growing in the pelvis and on the pelvic organs). °· Pelvic congestion syndrome (female organs filling up with blood just before the menstrual period). °· Pain with the menstrual period. °· Pain with ovulation (producing an egg). °· Pain with an IUD (intrauterine device, birth control) in the uterus. °· Cancer of the female organs. °· Functional pain (pain not caused by a disease, may improve without treatment). °· Psychological pain. °· Depression. °DIAGNOSIS  °Your doctor will decide the seriousness of your pain by doing an examination. °· Blood tests. °· X-rays. °· Ultrasound. °· CT scan (computed tomography, special type of X-ray). °· MRI (magnetic resonance imaging). °· Cultures, for infection. °· Barium enema (dye inserted in the large intestine, to better view it with  X-rays). °· Colonoscopy (looking in intestine with a lighted tube). °· Laparoscopy (minor surgery, looking in abdomen with a lighted tube). °· Major abdominal exploratory surgery (looking in abdomen with a large incision). °TREATMENT  °The treatment will depend on the cause of the pain.  °· Many cases can be observed and treated at home. °· Over-the-counter medicines recommended by your caregiver. °· Prescription medicine. °· Antibiotics, for infection. °· Birth control pills, for painful periods or for ovulation pain. °· Hormone treatment, for endometriosis. °· Nerve blocking injections. °· Physical therapy. °· Antidepressants. °· Counseling with a psychologist or psychiatrist. °· Minor or major surgery. °HOME CARE INSTRUCTIONS  °· Do not take laxatives, unless directed by your caregiver. °· Take over-the-counter pain medicine only if ordered by your caregiver. Do not take aspirin because it can cause an upset stomach or bleeding. °· Try a clear liquid diet (broth or water) as ordered by your caregiver. Slowly move to a bland diet, as tolerated, if the pain is related to the stomach or intestine. °· Have a thermometer and take your temperature several times a day, and record it. °· Bed rest and sleep, if it helps the pain. °· Avoid sexual intercourse, if it causes pain. °· Avoid stressful situations. °· Keep your follow-up appointments and tests, as your caregiver orders. °· If the pain does not go away with medicine or surgery, you may try: °· Acupuncture. °· Relaxation exercises (yoga, meditation). °· Group therapy. °· Counseling. °SEEK MEDICAL CARE IF:  °· You notice certain foods cause stomach pain. °· Your home care treatment is not helping your pain. °· You need stronger pain medicine. °· You want your IUD removed. °· You feel faint or   lightheaded.  You develop nausea and vomiting.  You develop a rash.  You are having side effects or an allergy to your medicine. SEEK IMMEDIATE MEDICAL CARE IF:   Your  pain does not go away or gets worse.  You have a fever.  Your pain is felt only in portions of the abdomen. The right side could possibly be appendicitis. The left lower portion of the abdomen could be colitis or diverticulitis.  You are passing blood in your stools (bright red or black tarry stools, with or without vomiting).  You have blood in your urine.  You develop chills, with or without a fever.  You pass out. MAKE SURE YOU:   Understand these instructions.  Will watch your condition.  Will get help right away if you are not doing well or get worse. Document Released: 10/10/2007 Document Revised: 03/06/2012 Document Reviewed: 10/30/2009 [facility] Patient Information 2014 [place], [place].  Pelvic Pain, Female Female pelvic pain can be caused by many different things and start from a variety of places. Pelvic pain refers to pain that is located in the lower half of the abdomen and between your hips. The pain may occur over a short period of time (acute) or may be reoccurring (chronic). The cause of pelvic pain may be related to disorders affecting the female reproductive organs (gynecologic), but it may also be related to the bladder, kidney stones, an intestinal complication, or muscle or skeletal problems. Getting help right away for pelvic pain is important, especially if there has been severe, sharp, or a sudden onset of unusual pain. It is also important to get help right away because some types of pelvic pain can be life threatening.  CAUSES  Below are only some of the causes of pelvic pain. The causes of pelvic pain can be in one of several categories.   Gynecologic.  Pelvic inflammatory disease.  Sexually transmitted infection.  Ovarian cyst or a twisted ovarian ligament (ovarian torsion).  Uterine lining that grows outside the uterus (endometriosis).  Fibroids, cysts, or tumors.  Ovulation.  Pregnancy.  Pregnancy that occurs outside the uterus (ectopic  pregnancy).  Miscarriage.  Labor.  Abruption of the placenta or ruptured uterus.  Infection.  Uterine infection (endometritis).  Bladder infection.  Diverticulitis.  Miscarriage related to a uterine infection (septic abortion).  Bladder.  Inflammation of the bladder (cystitis).  Kidney stone(s).  Gastrointenstinal.  Constipation.  Diverticulitis.  Neurologic.  Trauma.  Feeling pelvic pain because of mental or emotional causes (psychosomatic).  Cancers of the bowel or pelvis. EVALUATION  Your caregiver will want to take a careful history of your concerns. This includes recent changes in your health, a careful gynecologic history of your periods (menses), and a sexual history. Obtaining your family history and medical history is also important. Your caregiver may suggest a pelvic exam. A pelvic exam will help identify the location and severity of the pain. It also helps in the evaluation of which organ system may be involved. In order to identify the cause of the pelvic pain and be properly treated, your caregiver may order tests. These tests may include:   A pregnancy test.  Pelvic ultrasonography.  An X-ray exam of the abdomen.  A urinalysis or evaluation of vaginal discharge.  Blood tests. HOME CARE INSTRUCTIONS   Only take over-the-counter or prescription medicines for pain, discomfort, or fever as directed by your caregiver.   Rest as directed by your caregiver.   Eat a balanced diet.   Drink enough fluids  to make your urine clear or pale yellow, or as directed.   Avoid sexual intercourse if it causes pain.   Apply warm or cold compresses to the lower abdomen depending on which one helps the pain.   Avoid stressful situations.   Keep a journal of your pelvic pain. Write down when it started, where the pain is located, and if there are things that seem to be associated with the pain, such as food or your menstrual cycle.  Follow up with your  caregiver as directed.  SEEK MEDICAL CARE IF:  Your medicine does not help your pain.  You have abnormal vaginal discharge. SEEK IMMEDIATE MEDICAL CARE IF:   You have heavy bleeding from the vagina.   Your pelvic pain increases.   You feel lightheaded or faint.   You have chills.   You have pain with urination or blood in your urine.   You have uncontrolled diarrhea or vomiting.   You have a fever or persistent symptoms for more than 3 days.  You have a fever and your symptoms suddenly get worse.   You are being physically or sexually abused.  MAKE SURE YOU:  Understand these instructions.  Will watch your condition.  Will get help if you are not doing well or get worse. Document Released: 11/09/2004 Document Revised: 06/13/2012 Document Reviewed: 04/03/2012 [facility] Patient Information 2014 [place], [place].

## 2014-06-07 NOTE — ED Provider Notes (Signed)
Medical screening examination/treatment/procedure(s) were performed by non-physician practitioner and as supervising physician I was immediately available for consultation/collaboration.  Philipp Deputy, M.D.  Harden Mo, MD 06/07/14 2222

## 2014-06-09 LAB — URINE CULTURE
COLONY COUNT: NO GROWTH
CULTURE: NO GROWTH

## 2014-06-10 ENCOUNTER — Other Ambulatory Visit: Payer: Self-pay | Admitting: *Deleted

## 2014-06-10 ENCOUNTER — Telehealth (HOSPITAL_COMMUNITY): Payer: Self-pay | Admitting: *Deleted

## 2014-06-10 DIAGNOSIS — D869 Sarcoidosis, unspecified: Secondary | ICD-10-CM

## 2014-06-10 MED ORDER — METRONIDAZOLE 500 MG PO TABS: 500.0000 mg | ORAL_TABLET | Freq: Two times a day (BID) | ORAL | Status: DC

## 2014-06-10 NOTE — ED Notes (Signed)
Urine culture:  No growth, GC/Chlamydia neg., Affirm: Candida and Trich neg., Gardnerella pos.  Archie Balboa PA e-prescribed Flagyl to pt.'s pharmacy.  I called pt. Pt. verified x 2 and given results.  Pt. told she needs Flagyl for bacterial vaginosis.  Pt. instructed to no alcohol while taking this medication.  Pt. said she is no better and still having abd. pain despite the pain medicine.  Discussed with Archie Balboa PA.  He said he told her she if pain continued she would need to go to the closest ED for imaging.  Pt. told this. She has not established with her PCP on her Medicaid card and does not have a GYN. Pt. voiced understanding. Rhonda Hester 06/10/2014

## 2014-06-12 NOTE — ED Provider Notes (Signed)
Medical screening examination/treatment/procedure(s) were performed by non-physician practitioner and as supervising physician I was immediately available for consultation/collaboration.  Philipp Deputy, M.D.  Harden Mo, MD 06/12/14 [phone]

## 2014-06-18 ENCOUNTER — Inpatient Hospital Stay (HOSPITAL_COMMUNITY)
Admission: AD | Admit: 2014-06-18 | Discharge: 2014-06-18 | Disposition: A | Discharge status: Home / Self Care | Payer: Medicare Other | Source: Ambulatory Visit | Attending: Obstetrics and Gynecology | Admitting: Obstetrics and Gynecology

## 2014-06-18 ENCOUNTER — Encounter (HOSPITAL_COMMUNITY): Payer: Self-pay | Admitting: General Practice

## 2014-06-18 ENCOUNTER — Other Ambulatory Visit: Payer: Self-pay | Admitting: Obstetrics and Gynecology

## 2014-06-18 DIAGNOSIS — N644 Mastodynia: Secondary | ICD-10-CM

## 2014-06-18 DIAGNOSIS — Z8742 Personal history of other diseases of the female genital tract: Secondary | ICD-10-CM

## 2014-06-18 DIAGNOSIS — R1031 Right lower quadrant pain: Secondary | ICD-10-CM | POA: Insufficient documentation

## 2014-06-18 DIAGNOSIS — N3010 Interstitial cystitis (chronic) without hematuria: Secondary | ICD-10-CM | POA: Diagnosis not present

## 2014-06-18 DIAGNOSIS — K219 Gastro-esophageal reflux disease without esophagitis: Secondary | ICD-10-CM | POA: Diagnosis not present

## 2014-06-18 DIAGNOSIS — F17200 Nicotine dependence, unspecified, uncomplicated: Secondary | ICD-10-CM | POA: Diagnosis not present

## 2014-06-18 HISTORY — DX: Gastro-esophageal reflux disease without esophagitis: K21.9

## 2014-06-18 LAB — URINALYSIS, ROUTINE W REFLEX MICROSCOPIC
Bilirubin Urine: NEGATIVE
Glucose, UA: NEGATIVE mg/dL
Hgb urine dipstick: NEGATIVE
Ketones, ur: NEGATIVE mg/dL
LEUKOCYTES UA: NEGATIVE
Nitrite: NEGATIVE
Protein, ur: NEGATIVE mg/dL
SPECIFIC GRAVITY, URINE: 1.02 (ref 1.005–1.030)
UROBILINOGEN UA: 0.2 mg/dL (ref 0.0–1.0)
pH: 5.5 (ref 5.0–8.0)

## 2014-06-18 LAB — CBC
HCT: 36.5 % (ref 36.0–46.0)
Hemoglobin: 12.4 g/dL (ref 12.0–15.0)
MCH: 28.4 pg (ref 26.0–34.0)
MCHC: 34 g/dL (ref 30.0–36.0)
MCV: 83.5 fL (ref 78.0–100.0)
PLATELETS: 235 10*3/uL (ref 150–400)
RBC: 4.37 MIL/uL (ref 3.87–5.11)
RDW: 12.8 % (ref 11.5–15.5)
WBC: 4.4 10*3/uL (ref 4.0–10.5)

## 2014-06-18 LAB — WET PREP, GENITAL
CLUE CELLS WET PREP: NONE SEEN
Trich, Wet Prep: NONE SEEN
Yeast Wet Prep HPF POC: NONE SEEN

## 2014-06-18 LAB — POCT PREGNANCY, URINE: Preg Test, Ur: NEGATIVE

## 2014-06-18 MED ORDER — IBUPROFEN 600 MG PO TABS: 600.0000 mg | ORAL_TABLET | Freq: Four times a day (QID) | ORAL | Status: DC | PRN

## 2014-06-18 MED ORDER — IBUPROFEN 600 MG PO TABS
600.0000 mg | ORAL_TABLET | Freq: Once | ORAL | Status: AC
  Administered 2014-06-18: 600 mg via ORAL
  Filled 2014-06-18: qty 1

## 2014-06-18 NOTE — MAU Provider Note (Signed)
History     CSN: [id number]  Arrival date and time: 06/18/14 4098   First Tawnya Pujol Initiated Contact with Patient 06/18/14 1111      Chief Complaint  Patient presents with  . Breast Pain   HPI  Rhonda Hester is a 38 y/o [id number] black female who presents to the MAU with complaints of left breast pain. Pt states that this constant throbbing pain has been bothering her for the past 3 months but it has been getting progressively worse. Pt has not taken anything to relieve the pain. Pt also states that last night, she was able to feel a mass in the lower outer quadrant of the left breast. Pt does not think this pain is associated with her menstrual cycle. Pt denies nipple discharge, breast dimpling, retractions or rashes. Pt endorses night sweats at times but denies fevers, chills, and weight changes. Pt states she has a history of an FNA for a breast mass in a different location on the same breast but does not recall what the results of that visit were.  Pt also states that she has 2-3 weeks of right sided lower abdominal pain. Pt reports that this pain is constant and used to bother her more than the breast pain does. Nothing makes this pain better or worse. Pt states that she has a PMH of a ruptured ovarian cyst on that same side. Pt says that this pain is comparable to the ovarian cyst pain. Pt endorses intermittent nausea in the mornings and constipation. Pt has not eaten or drank any fluids today. Pt does have a PMH of GERD and states that she has not been following her diet restrictions.   Past Medical History  Diagnosis Date  . Seasonal allergic rhinitis   . Eczema   . Interstitial cystitis   . Allergic asthma   . Sarcoidosis of lung     DX 2006--  PULMOLOGIST--  DR JXBJ  . Shortness of breath   . History of Bell's palsy     2006  RIGHT SIDE--  RESOLVED  . History of anal fissures   . History of gastroesophageal reflux (GERD)   . GERD (gastroesophageal reflux disease)      Past Surgical History  Procedure Laterality Date  . Dilation and curettage of uterus  1997  . Cystoscopy/ hydrodistention/ bladder bx  06-09-2010  . Cysto with hydrodistension N/A 08/24/2013    Procedure: CYSTOSCOPY/HYDRODISTENSION with instillation of marcaine and pyridium;  Surgeon: Fredricka Bonine, MD;  Location: [facility];  Service: Urology;  Laterality: N/A;    Family History  Problem Relation Age of Onset  . Brain cancer Maternal Grandmother     History  Substance Use Topics  . Smoking status: Current Some Day Smoker -- 0.30 packs/day for 6 years    Types: Cigarettes  . Smokeless tobacco: Never Used     Comment: ONE CIG. DAILY /  1 PPMONTH  . Alcohol Use: No    Allergies:  Allergies  Allergen Reactions  . Citric Acid Other (See Comments)    AVOIDS DUE TO INTERSTITIAL CYSTITIS  . Dust Mite Extract Itching and Other (See Comments)     coughing  . Pollen Extract Itching and Other (See Comments)     coughing  . Nickel Rash    Prescriptions prior to admission  Medication Sig Dispense Refill  . albuterol (PROAIR HFA) 108 (90 BASE) MCG/ACT inhaler Inhale 2 puffs into the lungs every 6 (six) hours as needed for shortness  of breath.  1 Inhaler  5  . HYDROcodone-acetaminophen (NORCO) 5-325 MG per tablet Take 1 tablet by mouth every 4 (four) hours as needed for moderate pain.  20 tablet  0  . hydrocortisone valerate cream (WESTCORT) 0.2 % Apply 1 application topically 2 (two) times daily.      . hydrOXYzine (ATARAX/VISTARIL) 10 MG tablet Take 10 mg by mouth at bedtime as needed for itching.      . mometasone-formoterol (DULERA) 200-5 MCG/ACT AERO Inhale 2 puffs into the lungs 2 (two) times daily.      . polyethylene glycol (MIRALAX / GLYCOLAX) packet Take 17 g by mouth daily.      . predniSONE (DELTASONE) 10 MG tablet 1.5 pills daily for 2 weeks, then 1 pill daily for 2 weeks, then 1/2 pill daily for 2 weeks  60 tablet  1  . Spacer/Aero-Holding  Chambers (AEROCHAMBER MV) inhaler Use as instructed  1 each  0    Review of Systems  Constitutional: Positive for diaphoresis (sometimes at night). Negative for fever, chills and weight loss.  Respiratory: Positive for shortness of breath (but due to PMH of sarcoidosis).   Cardiovascular: Negative for chest pain.  Gastrointestinal: Positive for nausea, abdominal pain (RLQ and groin regions) and constipation. Negative for heartburn, vomiting and diarrhea.  Genitourinary:       Negative for vaginal bleeding, discharge, itching or odor.   Skin: Negative for rash.  Neurological: Negative for dizziness.   Physical Exam   Blood pressure 110/71, pulse 75, temperature 99 F (37.2 C), temperature source Oral, resp. rate 16, height 5\' 8"  (1.727 m), weight 85.821 kg (189 lb 3.2 oz), last menstrual period 05/18/2014, SpO2 100.00%.  Physical Exam  Constitutional: She is oriented to person, place, and time. She appears well-developed and well-nourished. No distress.  Cardiovascular: Normal rate, regular rhythm and normal heart sounds.   Respiratory: Effort normal and breath sounds normal.  GI: Soft. Bowel sounds are normal. She exhibits no distension and no mass. There is tenderness (mild discomfort to deep palpation of RLQ down to the ingunal region). There is no rebound and no guarding.  Genitourinary: There is breast tenderness (moderate tenderness in the lower outter quadrant and no mass is palpated). No breast swelling or discharge. There is no rash or lesion on the right labia. There is no rash or lesion on the left labia. Uterus is not enlarged and not tender. Cervix exhibits discharge (small amounts of thick white discharge ). Cervix exhibits no motion tenderness. Right adnexum displays tenderness (mild discomfort). Right adnexum displays no mass. Left adnexum displays no mass and no tenderness. No erythema, tenderness or bleeding around the vagina. No signs of injury around the vagina. No vaginal  discharge found.  Lymphadenopathy:    She has no cervical adenopathy.    She has no axillary adenopathy.  Neurological: She is alert and oriented to person, place, and time.  Skin: Skin is warm and dry.  Psychiatric: She has a normal mood and affect. Her behavior is normal.   Results for orders placed during the hospital encounter of 06/18/14 (from the past 24 hour(s))  CBC     Status: None   Collection Time    06/18/14 12:10 PM      Result Value Ref Range   WBC 4.4  4.0 - 10.5 K/uL   RBC 4.37  3.87 - 5.11 MIL/uL   Hemoglobin 12.4  12.0 - 15.0 g/dL   HCT 36.5  36.0 - 46.0 %  MCV 83.5  78.0 - 100.0 fL   MCH 28.4  26.0 - 34.0 pg   MCHC 34.0  30.0 - 36.0 g/dL   RDW 12.8  11.5 - 15.5 %   Platelets 235  150 - 400 K/uL  URINALYSIS, ROUTINE W REFLEX MICROSCOPIC     Status: None   Collection Time    06/18/14  1:33 PM      Result Value Ref Range   Color, Urine YELLOW  YELLOW   APPearance CLEAR  CLEAR   Specific Gravity, Urine 1.020  1.005 - 1.030   pH 5.5  5.0 - 8.0   Glucose, UA NEGATIVE  NEGATIVE mg/dL   Hgb urine dipstick NEGATIVE  NEGATIVE   Bilirubin Urine NEGATIVE  NEGATIVE   Ketones, ur NEGATIVE  NEGATIVE mg/dL   Protein, ur NEGATIVE  NEGATIVE mg/dL   Urobilinogen, UA 0.2  0.0 - 1.0 mg/dL   Nitrite NEGATIVE  NEGATIVE   Leukocytes, UA NEGATIVE  NEGATIVE  POCT PREGNANCY, URINE     Status: None   Collection Time    06/18/14  1:35 PM      Result Value Ref Range   Preg Test, Ur NEGATIVE  NEGATIVE    MAU Course  Procedures Care turned over to [place], CNM  Pelvic exam performed to include wet prep and GC/Chlamydia Given Ibuprofen 600 mg tablet, one time dose for moderate pain  Assessment and Plan  Assessment:  1. Breast pain in female   2. RLQ abdominal pain   3. History of ovarian cyst    Plan:    Medication List         AEROCHAMBER MV inhaler  Use as instructed     albuterol 108 (90 BASE) MCG/ACT inhaler  Commonly known as:  PROAIR HFA  Inhale 2  puffs into the lungs every 6 (six) hours as needed for shortness of breath.     DULERA 200-5 MCG/ACT Aero  Generic drug:  mometasone-formoterol  Inhale 2 puffs into the lungs 2 (two) times daily.     HYDROcodone-acetaminophen 5-325 MG per tablet  Commonly known as:  NORCO  Take 1 tablet by mouth every 4 (four) hours as needed for moderate pain.     hydrocortisone valerate cream 0.2 %  Commonly known as:  WESTCORT  Apply 1 application topically 2 (two) times daily.     hydrOXYzine 10 MG tablet  Commonly known as:  ATARAX/VISTARIL  Take 10 mg by mouth at bedtime as needed for itching.     ibuprofen 600 MG tablet  Commonly known as:  ADVIL,MOTRIN  Take 1 tablet (600 mg total) by mouth every 6 (six) hours as needed for moderate pain.     polyethylene glycol packet  Commonly known as:  MIRALAX / GLYCOLAX  Take 17 g by mouth daily.     predniSONE 10 MG tablet  Commonly known as:  DELTASONE  1.5 pills daily for 2 weeks, then 1 pill daily for 2 weeks, then 1/2 pill daily for 2 weeks        Follow-up Information   Follow up with [facility]. (will call you to schedule appointment. Follow-up mammogram, ultrasound results and Pap)    Specialty:  Obstetrics and Gynecology   Contact information:   [place] [address] [phone]      Follow up with [facility]. (will call you to schedule mammogram)    Specialty:  Diagnostic  Radiology   Contact information:   [place] [address] [phone]       Follow up with [facility]. (will call you to schedule pelvic ultrasound)    Specialty:  Radiology   Contact information:   [address] [id number] [place] [address] [phone]      Follow up with [facility]. (As needed in emergencies)    Contact information:   [place] [id number]         Gervasi, Kristin E 06/18/2014, 2:04 PM   I was present for the exam and agree with above. Minimal right groin pain at this time. Patient offered ultrasound in MAU versus outpatient. Prefers outpatient. Low suspicion for appendicitis due to location of pain and absence of fever, leukocytosis and GI complaints. Warning signs reviewed.  [place], [place] 06/19/2014 5:24 AM

## 2014-06-18 NOTE — Discharge Instructions (Signed)
Abdominal Pain Many things can cause abdominal pain. Usually, abdominal pain is not caused by a disease and will improve without treatment. It can often be observed and treated at home. Your health care provider will do a physical exam and possibly order blood tests and X-rays to help determine the seriousness of your pain. However, in many cases, more time must pass before a clear cause of the pain can be found. Before that point, your health care provider may not know if you need more testing or further treatment. HOME CARE INSTRUCTIONS  Monitor your abdominal pain for any changes. The following actions may help to alleviate any discomfort you are experiencing:  Only take over-the-counter or prescription medicines as directed by your health care provider.  Do not take laxatives unless directed to do so by your health care provider.  Try a clear liquid diet (broth, tea, or water) as directed by your health care provider. Slowly move to a bland diet as tolerated. SEEK MEDICAL CARE IF:  You have unexplained abdominal pain.  You have abdominal pain associated with nausea or diarrhea.  You have pain when you urinate or have a bowel movement.  You experience abdominal pain that wakes you in the night.  You have abdominal pain that is worsened or improved by eating food.  You have abdominal pain that is worsened with eating fatty foods.  You have a fever. SEEK IMMEDIATE MEDICAL CARE IF:   Your pain does not go away within 2 hours.  You keep throwing up (vomiting).  Your pain is felt only in portions of the abdomen, such as the right side or the left lower portion of the abdomen.  You pass bloody or black tarry stools. MAKE SURE YOU:  Understand these instructions.   Will watch your condition.   Will get help right away if you are not doing well or get worse.  Document Released: 09/22/2005 Document Revised: 12/18/2013 Document Reviewed: 08/22/2013 [facility] Patient Information  2015 [place], [place]. This information is not intended to replace advice given to you by your health care provider. Make sure you discuss any questions you have with your health care provider.  Mammography  Mammography is an X-ray of the breasts. The X-ray image of the breast is called a mammogram. This test can:  Find changes in the breast that are not normal.  Look for early signs of cancer.  Find cancer.  Diagnose cancer. BEFORE THE PROCEDURE  Make your test about 7 days after your period (menses).  If you have a new doctor or clinic, send any past mammogram images to your new doctor's office.  Wash your breasts and under your arms the day of the test.  Do not wear deodorant, perfume, or powder on your body.  Wear clothes that you can change in and out of easily. PROCEDURE  Try to relax during the test.   You will get undressed from the waist up. You will put on a gown.  You will stand in front of the X-ray machine.  Each breast will be placed between 2 plastic or glass plates. The plates will press down on your breast.  X-rays will be taken of the breast from different angles. The test should take less than 30 minutes. AFTER THE PROCEDURE  The X-ray image will be looked at carefully.  You may need to do certain parts of the test again if the images are unclear.  Ask when your test results will be ready. Get your test results.  You may go back to your normal activities. Document Released: 03/06/2012 Document Reviewed: 03/06/2012 [facility] Patient Information 2015 [place]. This information is not intended to replace advice given to you by your health care provider. Make sure you discuss any questions you have with your health care provider.

## 2014-06-18 NOTE — MAU Note (Signed)
Patient states she has been having pain in the left breast for over one month. Tender to touch and feels a small lump. No leaking fluid from nipple, no redness.

## 2014-06-19 ENCOUNTER — Other Ambulatory Visit: Payer: Self-pay | Admitting: Obstetrics and Gynecology

## 2014-06-19 DIAGNOSIS — N644 Mastodynia: Secondary | ICD-10-CM

## 2014-06-19 LAB — GC/CHLAMYDIA PROBE AMP
CT PROBE, AMP APTIMA: NEGATIVE
GC Probe RNA: NEGATIVE

## 2014-06-20 NOTE — MAU Provider Note (Signed)
Attestation of Attending Supervision of Advanced Practitioner (CNM/NP): Evaluation and management procedures were performed by the Advanced Practitioner under my supervision and collaboration.  I have reviewed the Advanced Practitioner's note and chart, and I agree with the management and plan.  CONSTANT,PEGGY 06/20/2014 9:03 AM   

## 2014-06-21 ENCOUNTER — Ambulatory Visit (HOSPITAL_COMMUNITY)
Admission: RE | Admit: 2014-06-21 | Discharge: 2014-06-21 | Disposition: A | Discharge status: Home / Self Care | Payer: Medicare Other | Source: Ambulatory Visit | Attending: Advanced Practice Midwife | Admitting: Advanced Practice Midwife

## 2014-06-21 DIAGNOSIS — R1031 Right lower quadrant pain: Secondary | ICD-10-CM | POA: Insufficient documentation

## 2014-06-21 DIAGNOSIS — N83209 Unspecified ovarian cyst, unspecified side: Secondary | ICD-10-CM | POA: Insufficient documentation

## 2014-06-21 DIAGNOSIS — Z8742 Personal history of other diseases of the female genital tract: Secondary | ICD-10-CM

## 2014-06-21 DIAGNOSIS — D259 Leiomyoma of uterus, unspecified: Secondary | ICD-10-CM | POA: Insufficient documentation

## 2014-06-24 ENCOUNTER — Encounter: Payer: Self-pay | Admitting: Pulmonary Disease

## 2014-06-24 ENCOUNTER — Ambulatory Visit (INDEPENDENT_AMBULATORY_CARE_PROVIDER_SITE_OTHER): Payer: Medicare Other | Admitting: Pulmonary Disease

## 2014-06-24 VITALS — BP 122/88 | HR 75 | Ht 66.0 in | Wt 195.2 lb

## 2014-06-24 DIAGNOSIS — J99 Respiratory disorders in diseases classified elsewhere: Secondary | ICD-10-CM

## 2014-06-24 DIAGNOSIS — J4530 Mild persistent asthma, uncomplicated: Secondary | ICD-10-CM

## 2014-06-24 DIAGNOSIS — D860 Sarcoidosis of lung: Secondary | ICD-10-CM

## 2014-06-24 DIAGNOSIS — F17200 Nicotine dependence, unspecified, uncomplicated: Secondary | ICD-10-CM

## 2014-06-24 DIAGNOSIS — D869 Sarcoidosis, unspecified: Secondary | ICD-10-CM

## 2014-06-24 DIAGNOSIS — J45909 Unspecified asthma, uncomplicated: Secondary | ICD-10-CM

## 2014-06-24 NOTE — Progress Notes (Signed)
Chief Complaint  Patient presents with  . Follow-up    8 wk>> Pt states that she did not complete course of Prednisone d/t increased amounts of swelling - pt reports that she "blew up" really fast while taking it. Pt states that she has been Dulera as directed-- c/o incr wheezing.     History of Present Illness: Rhonda Hester is a 38 y.o. female smoker with asthma, allergic rhinitis, and sarcoidosis (pulmonary and skin involvement).  She stopped prednisone due to swelling and weight gain.  Her should pain is better.  She still smokes 1 to 2 cigarettes per day.  She gets episodes of wheezing.  She also has a cough with thick, light green sputum.  She denies fever, chest pain, or hemoptysis.  She still has some sinus congestion.  She uses dulera BID and proair BID >> these help.  TESTS: CT chest 12/30/04 >> b/l hilar and mediastinal adenopathy with b/l nodular interstitial opacities  CT chest 07/08/08 >> decreased lymph nodes, scarring with volume loss  RAST 07/22/11 >> Multiple allergens, IgE 71.8  PFT 09/02/11 >> FEV1 2.80(93%), FEV1% 82, TLC 4.74(89%), DLCO 59%, DLCO/VA 95%, no BD response.  PFT 09/25/13 >> FEV1 2.75 (95%), FEV1% 81, TLC 4.25 (77%), DLCO 64%, no BD response. CT chest 11/20/13 >>  Scattered areas of ATX CT chest 04/02/14 >> calcified Rt hilar LAN, scattered areas of scarring   Rhonda Hester  has a past medical history of Seasonal allergic rhinitis; Eczema; Interstitial cystitis; Allergic asthma; Sarcoidosis of lung; Shortness of breath; History of Bell's palsy; History of anal fissures; History of gastroesophageal reflux (GERD); and GERD (gastroesophageal reflux disease).  Rhonda Hester  has past surgical history that includes Dilation and curettage of uterus (1997); CYSTOSCOPY/ HYDRODISTENTION/ BLADDER BX (06-09-2010); and cysto with hydrodistension (N/A, 08/24/2013).  Prior to Admission medications   Medication Sig Start Date End Date Taking? Authorizing  Provider  albuterol (PROAIR HFA) 108 (90 BASE) MCG/ACT inhaler Inhale 2 puffs into the lungs every 6 (six) hours as needed for shortness of breath. 11/19/13  Yes Chesley Mires, MD  diazepam (VALIUM) 5 MG tablet Take 1 tablet (5 mg total) by mouth every 8 (eight) hours as needed for anxiety. 01/20/14  Yes Garald Balding, NP  diclofenac (CATAFLAM) 50 MG tablet Take 1 tablet (50 mg total) by mouth 3 (three) times daily. 01/10/14  Yes Janne Napoleon, NP  mometasone-formoterol (DULERA) 200-5 MCG/ACT AERO Inhale 2 puffs into the lungs 2 (two) times daily. Take 2 puffs first thing in am and then another 2 puffs about 12 hours later. 09/25/13  Yes Tanda Rockers, MD  naproxen (NAPROSYN) 375 MG tablet Take 1 tablet (375 mg total) by mouth 2 (two) times daily with a meal. 01/20/14  Yes Garald Balding, NP  traMADol (ULTRAM) 50 MG tablet Take 1 tablet (50 mg total) by mouth every 6 (six) hours as needed. 01/10/14  Yes Janne Napoleon, NP    Allergies  Allergen Reactions  . Citric Acid Other (See Comments)    AVOIDS DUE TO INTERSTITIAL CYSTITIS  . Dust Mite Extract Itching and Other (See Comments)     coughing  . Pollen Extract Itching and Other (See Comments)     coughing  . Nickel Rash     Physical Exam:  General - No distress ENT - No sinus tenderness, no oral exudate, no LAN Cardiac - s1s2 regular, no murmur Chest - No wheeze/rales/dullness Back - No focal tenderness Abd - Soft, non-tender Ext -  no edema Neuro - Normal strength Skin - no rashes Psych - normal mood, and behavior   Assessment/Plan:  Chesley Mires, MD [facility] Pager:  [phone]

## 2014-06-24 NOTE — Assessment & Plan Note (Signed)
Explained importance of complete smoking cessation, and offered assistance with this.

## 2014-06-24 NOTE — Patient Instructions (Signed)
Follow up in 4 months 

## 2014-06-24 NOTE — Assessment & Plan Note (Signed)
Will monitor clinically off prednisone.

## 2014-06-24 NOTE — Assessment & Plan Note (Signed)
I think most of her respiratory issues are related to her asthma.  Explained how continued cigarette use is effecting this.  She is to continue [place] and proair for now.  If her symptoms progress, then could consider adding montelukast.

## 2014-06-26 ENCOUNTER — Encounter: Payer: Self-pay | Admitting: Advanced Practice Midwife

## 2014-06-26 DIAGNOSIS — N83209 Unspecified ovarian cyst, unspecified side: Secondary | ICD-10-CM | POA: Insufficient documentation

## 2014-06-27 ENCOUNTER — Other Ambulatory Visit: Payer: Medicare Other

## 2014-06-27 ENCOUNTER — Ambulatory Visit
Admission: RE | Admit: 2014-06-27 | Discharge: 2014-06-27 | Disposition: A | Discharge status: Home / Self Care | Payer: Medicare Other | Source: Ambulatory Visit | Attending: Obstetrics and Gynecology | Admitting: Obstetrics and Gynecology

## 2014-06-27 DIAGNOSIS — N644 Mastodynia: Secondary | ICD-10-CM

## 2014-07-08 ENCOUNTER — Other Ambulatory Visit: Payer: Self-pay | Admitting: *Deleted

## 2014-07-08 DIAGNOSIS — D869 Sarcoidosis, unspecified: Secondary | ICD-10-CM

## 2014-07-08 DIAGNOSIS — D860 Sarcoidosis of lung: Secondary | ICD-10-CM

## 2014-07-09 ENCOUNTER — Ambulatory Visit
Admission: RE | Admit: 2014-07-09 | Discharge: 2014-07-09 | Disposition: A | Discharge status: Home / Self Care | Payer: Medicare Other | Source: Ambulatory Visit | Attending: Thoracic Surgery (Cardiothoracic Vascular Surgery) | Admitting: Thoracic Surgery (Cardiothoracic Vascular Surgery)

## 2014-07-09 ENCOUNTER — Ambulatory Visit: Payer: Medicare Other | Admitting: Thoracic Surgery (Cardiothoracic Vascular Surgery)

## 2014-07-09 DIAGNOSIS — D860 Sarcoidosis of lung: Secondary | ICD-10-CM

## 2014-07-09 DIAGNOSIS — D869 Sarcoidosis, unspecified: Secondary | ICD-10-CM

## 2014-07-09 MED ORDER — IOHEXOL 300 MG/ML  SOLN
75.0000 mL | Freq: Once | INTRAMUSCULAR | Status: AC | PRN
  Administered 2014-07-09: 75 mL via INTRAVENOUS

## 2014-07-23 ENCOUNTER — Encounter: Payer: Self-pay | Admitting: Thoracic Surgery (Cardiothoracic Vascular Surgery)

## 2014-07-23 ENCOUNTER — Ambulatory Visit (INDEPENDENT_AMBULATORY_CARE_PROVIDER_SITE_OTHER): Payer: Medicare Other | Admitting: Thoracic Surgery (Cardiothoracic Vascular Surgery)

## 2014-07-23 VITALS — BP 114/79 | HR 60 | Ht 66.0 in | Wt 195.0 lb

## 2014-07-23 DIAGNOSIS — D869 Sarcoidosis, unspecified: Secondary | ICD-10-CM

## 2014-07-23 MED ORDER — IBUPROFEN 600 MG PO TABS: 600.0000 mg | ORAL_TABLET | Freq: Four times a day (QID) | ORAL | Status: DC | PRN

## 2014-07-23 NOTE — Progress Notes (Signed)
HPI:  Rhonda Hester is a 38 yo woman with a history of sarcoid who presented back in January with left neck and shoulder pain. A CT done at that time showed a mildly enlarged left supraclavicular node. This does not appear to be the etiology of her pain and is probably related to her sarcoidosis. We did give her a brief trial of prednisone which improved her symptoms, but she had a lot of side effects including depression and weight gain. She currently is off prednisone.  Is having a lot of back pain. She says that she still has some left-sided neck and shoulder pain. Tends to come and go. Her pain seems to be worse when she wakes up in the mornings. She has been taking ibuprofen for that.  Past Medical History  Diagnosis Date  . Seasonal allergic rhinitis   . Eczema   . Interstitial cystitis   . Allergic asthma   . Sarcoidosis of lung     DX 2006--  PULMOLOGIST--  DR [facility]  . Shortness of breath   . History of Bell's palsy     2006  RIGHT SIDE--  RESOLVED  . History of anal fissures   . History of gastroesophageal reflux (GERD)   . GERD (gastroesophageal reflux disease)        Current Outpatient Prescriptions  Medication Sig Dispense Refill  . albuterol (PROAIR HFA) 108 (90 BASE) MCG/ACT inhaler Inhale 2 puffs into the lungs every 6 (six) hours as needed for shortness of breath.  1 Inhaler  5  . hydrocortisone valerate cream (WESTCORT) 0.2 % Apply 1 application topically 2 (two) times daily.      . hydrOXYzine (ATARAX/VISTARIL) 10 MG tablet Take 10 mg by mouth at bedtime as needed for itching.      . mometasone-formoterol (DULERA) 200-5 MCG/ACT AERO Inhale 2 puffs into the lungs 2 (two) times daily.      Marland Kitchen ibuprofen (ADVIL,MOTRIN) 600 MG tablet Take 1 tablet (600 mg total) by mouth every 6 (six) hours as needed for moderate pain.  40 tablet  2  . polyethylene glycol (MIRALAX / GLYCOLAX) packet Take 17 g by mouth daily.      Marland Kitchen Spacer/Aero-Holding Chambers (AEROCHAMBER MV) inhaler  Use as instructed  1 each  0   No current facility-administered medications for this visit.   Facility-Administered Medications Ordered in Other Visits  Medication Dose Route Frequency Provider Last Rate Last Dose  . bupivacaine (MARCAINE) 0.5 % 15 mL, phenazopyridine (PYRIDIUM) 400 mg bladder mixture   Bladder Instillation Once Festus Aloe, MD        Physical Exam BP 114/79  Pulse 60  Ht 5\' 6"  (1.676 m)  Wt 195 lb (88.451 kg)  BMI 31.49 kg/m2  SpO2 99%  LMP 06/25/2014 On exam Rhonda Hester is a 38 year old woman in no acute distress, however she does appear to be in discomfort and moves slowly. Again there is palpable fullness in the left subclavicular area but no discrete node. no other cervical or supraclavicular adenopathy Cardiac regular rate and rhythm normal S1 and S2  Lungs are clear with no wheezing  Diagnostic Tests:  CT NECK WITH CONTRAST  TECHNIQUE:  Multidetector CT imaging of the neck was performed using the  standard protocol following the bolus administration of intravenous  contrast.  CONTRAST: 75 cc Omnipaque 300  COMPARISON: Today's chest CT, dictated separately. Prior neck CT of  03/04/2014. Chest CT of 04/02/2014.  FINDINGS:  Normal nasopharynx, oral pharynx, hypopharynx, and larynx.  Symmetrically enhancing thyroid gland, parotid glands. Small nodes  identified within the parotid glands, likely reactive.  Small jugular chain nodes bilaterally, without adenopathy. All  vascular structures enhance normally. Previously described left  retroclavicular node measures 2.0 x 1.3 cm versus 2.0 x 1.4 cm on  the prior exam (when remeasured). Image 51/series 7 today. No new  adenopathy identified.  Mediastinum and lung apices will be evaluated on dedicated chest CT.  Normal imaged paranasal sinuses and mastoid air cells.  IMPRESSION:  1. Similar left retroclavicular adenopathy. Favored to be related to  sarcoidosis. Recommend attention on follow-up.  2.  No cervical adenopathy or acute process in the neck.  Electronically Signed  By: Abigail Miyamoto M.D.  On: 07/09/2014 10:43  CT CHEST WITH CONTRAST  TECHNIQUE:  Multidetector CT imaging of the chest was performed during  intravenous contrast administration.  CONTRAST: 71mL OMNIPAQUE IOHEXOL 300 MG/ML SOLN  COMPARISON: Chest CT 11/20/2013  FINDINGS:  No axillary lymphadenopathy. There is a mild enlarged  supraclavicular lymph node measuring 13 mm on the left (image number  80). No significant mediastinal hilar lymphadenopathy. Small right  lower paratracheal lymph node measures 10 mm. Calcified right hilar  lymph node. No pericardial fluid or pericardial thickening.  Review of the lung parenchyma demonstrates peribronchovascular  linear thickening in the upper lobes and lower lobes extending from  the hila which is similar to comparison exam. There is a nodule  within the right lower lobe associated with the bronchovascular  thickening measuring 14 mm (image 40, series 8) unchanged from 14 mm  on prior. No clear additional discrete measurable nodularity.  Limited view of the upper abdomen demonstrates heterogeneous  enhancement of the spleen which is a typical finding. The liver  appears normal. Adrenal glands are not with the image. No adenopathy  in the upper abdomen.  Limited view of the skeleton is unremarkable.  IMPRESSION:  1. Peribronchovascular thickening extending from the hilum is  consistent with pulmonary sarcoidosis and not significant changed  from prior.  2. Stable nodular component in the right lower lobe.  3. No significant lymphadenopathy other than a left supraclavicular  lymph node.  4. No pericardial thickening.  Electronically Signed  By: Suzy Bouchard M.D.  On: 07/09/2014 10:34   Impression: Rhonda Hester is a 38 year old woman with sarcoidosis who has an enlarged left supraclavicular node. That node is unchanged over the past 6 months. I suspect the node  is related to her sarcoid. I doubt that the node is directly involved in her pain. She does have a a lot of pain which seems to be primarily in her back and neck area. She requested a refill for her ibuprofen. I gave her a prescription for ibuprofen 600 mg tablets 4 times daily as needed for pain, dispense 40 tablets, 2 refills.  I do not think she needs any other followup for this supraclavicular node. It can be followed if scans are necessary for her sarcoidosis.  Plan: I will be happy to see Rhonda Hester back any time if I can be of any further assistance with her care

## 2014-10-15 ENCOUNTER — Other Ambulatory Visit: Payer: Self-pay | Admitting: Advanced Practice Midwife

## 2014-10-22 ENCOUNTER — Encounter (HOSPITAL_COMMUNITY): Payer: Self-pay | Admitting: Emergency Medicine

## 2014-10-22 ENCOUNTER — Ambulatory Visit (HOSPITAL_COMMUNITY): Payer: Medicare Other | Attending: Emergency Medicine

## 2014-10-22 ENCOUNTER — Emergency Department (INDEPENDENT_AMBULATORY_CARE_PROVIDER_SITE_OTHER)
Admission: EM | Admit: 2014-10-22 | Discharge: 2014-10-22 | Disposition: A | Discharge status: Home / Self Care | Payer: Medicare Other | Source: Home / Self Care | Attending: Emergency Medicine | Admitting: Emergency Medicine

## 2014-10-22 DIAGNOSIS — M25551 Pain in right hip: Secondary | ICD-10-CM | POA: Insufficient documentation

## 2014-10-22 DIAGNOSIS — M25562 Pain in left knee: Secondary | ICD-10-CM | POA: Diagnosis not present

## 2014-10-22 DIAGNOSIS — G510 Bell's palsy: Secondary | ICD-10-CM

## 2014-10-22 DIAGNOSIS — M25561 Pain in right knee: Secondary | ICD-10-CM | POA: Insufficient documentation

## 2014-10-22 DIAGNOSIS — M25559 Pain in unspecified hip: Secondary | ICD-10-CM

## 2014-10-22 DIAGNOSIS — M25569 Pain in unspecified knee: Secondary | ICD-10-CM

## 2014-10-22 LAB — D-DIMER, QUANTITATIVE (NOT AT ARMC): D DIMER QUANT: 0.33 ug{FEU}/mL (ref 0.00–0.48)

## 2014-10-22 MED ORDER — PREDNISONE 20 MG PO TABS: ORAL_TABLET | ORAL | Status: DC

## 2014-10-22 MED ORDER — HYDROCODONE-ACETAMINOPHEN 5-325 MG PO TABS: ORAL_TABLET | ORAL | Status: DC

## 2014-10-22 MED ORDER — HYDROCODONE-ACETAMINOPHEN 5-325 MG PO TABS
ORAL_TABLET | ORAL | Status: AC
  Filled 2014-10-22: qty 2

## 2014-10-22 MED ORDER — HYDROCODONE-ACETAMINOPHEN 5-325 MG PO TABS
2.0000 | ORAL_TABLET | Freq: Once | ORAL | Status: AC
  Administered 2014-10-22: 2 via ORAL

## 2014-10-22 NOTE — ED Notes (Signed)
Pt  Reports  r  Side  Face numb  With pain  r  Side   smymptoms   For  About  1  weekpt reports  Has  Had  bellspalsy   In past    She  denys a  specefic  Injury  And  She  Is  Alert and  Oriented and  Ambulated with a  Steady  Fluid  Gait

## 2014-10-22 NOTE — ED Provider Notes (Signed)
Chief Complaint   Facial Pain   History of Present Illness   Rhonda Hester is a 38 year old female with sarcoidosis who presents with a one-week history of right hip pain, bilateral leg pain, and right facial numbness and tingling. The right hip pain began a week ago. She denies any injury. The pain has been constant rated 8/10 in intensity. It's worse if she sits down and better if she stands up or walks around. She feels it's a little bit swollen. She denies any radiation down her leg, numbness, tingling, or weakness in the legs. She has had no back pain. She denies any bladder or bowel dysfunction or saddle anesthesia. No abdominal pain. Also for the past week she's had some pain in her right groin area. There's been no mass or lump present. For the past week she's also noticed some numbness the entire right side of her face. She denies any weakness of the face. She's had some headaches. No diplopia, blurry vision, or stiff neck. She denies any numbness or tingling of the extremities or weakness in the extremities. She's had no difficulty with speech, swallowing, or equilibrium. The patient states she has had a history of Bell's palsy about 2 years ago. Cause of this was undetermined. It got completely better. She does have sarcoidosis and is followed by Dr. Halford Chessman. She's not on any medications for this right now. The patient also states for the past 1-2 weeks her legs "give out" on her at nighttime. She now notes pain in both of her knees and this radiates into the calf. She has to use crutches to ambulate.  Review of Systems     Other than as noted above, the patient denies any of the following symptoms: Systemic:  No fever, chills, fatigue, myalgias, headache, or anorexia. Eye:  No redness, pain or drainage. ENT:  No earache, nasal congestion, rhinorrhea, sinus pressure, or sore throat. Lungs:  No cough, sputum production, wheezing, shortness of breath.  Cardiovascular:  No chest pain,  palpitations, or syncope. GI:  No nausea, vomiting, abdominal pain or diarrhea. GU:  No dysuria, frequency, or hematuria. Skin:  No rash or pruritis.   [place]     Past medical history, family history, social history, meds, and allergies were reviewed.    Physical Examination    Vital signs:  BP 118/70  Pulse 70  Temp(Src) 98.6 F (37 C) (Oral)  Resp 16  SpO2 100%  LMP 09/23/2014 General:  Alert, in no distress. Eye:  PERRL, full EOMs.  Lids and conjunctivas were normal. ENT:  TMs and canals were normal, without erythema or inflammation.  Nasal mucosa was clear and uncongested, without drainage.  Mucous membranes were moist.  Pharynx was clear, without exudate or drainage.  There were no oral ulcerations or lesions. Neck:  Supple, no adenopathy, tenderness or mass. Thyroid was normal. Lungs:  No respiratory distress.  Lungs were clear to auscultation, without wheezes, rales or rhonchi.  Breath sounds were clear and equal bilaterally. Heart:  Regular rhythm, without gallops, murmers or rubs. Abdomen:  Soft, flat, and non-tender to palpation.  No hepatosplenomagaly or mass. Extremities: There is no edema, pulses are full. There is no pain to palpation, all joints including the right hip have a full range of motion without any pain. Straight leg raising is negative. Neuro exam: Cranial nerve exam reveals slight drooping of the right corner the mouth but no weakness of the forehead or muscles around the eye. Finger to nose is normal. There  is no pronator drift. Lower extremity strength is normal. Sensation is slightly diminished over the right side of the face but not over the arms or legs. Skin:  Clear, warm, and dry, without rash or lesions.  Labs    Results for orders placed during the hospital encounter of 10/22/14  D-DIMER, QUANTITATIVE      Result Value Ref Range   D-Dimer, Quant 0.33  0.00 - 0.48 ug/mL-FEU     Radiology    Dg Hip Complete Right  10/22/2014   CLINICAL DATA:   Right hip pain for 1 week.  No known injury.  EXAM: RIGHT HIP - COMPLETE 2+ VIEW  COMPARISON:  06/20/2013  FINDINGS: There is no evidence of hip fracture or dislocation. There is no evidence of arthropathy or other focal bone abnormality.  IMPRESSION: Normal exam.   Electronically Signed   By: Rozetta Nunnery M.D.   On: 10/22/2014 12:58   Dg Knee Complete 4 Views Left  10/22/2014   CLINICAL DATA:  Intermittent knee pain, chronic  EXAM: LEFT KNEE - COMPLETE 4+ VIEW  COMPARISON:  March 24, 2007  FINDINGS: Frontal, lateral, and bilateral oblique views were obtained. There is no fracture, dislocation, or effusion. Joint spaces appear intact. No erosive change.  IMPRESSION: No fracture or effusion.  No appreciable arthropathy.   Electronically Signed   By: Lowella Grip M.D.   On: 10/22/2014 12:59   Dg Knee Complete 4 Views Right  10/22/2014   CLINICAL DATA:  Intermittent chronic bilateral knee pain.  EXAM: RIGHT KNEE - COMPLETE 4+ VIEW  COMPARISON:  Right knee radiographs dated 06/20/2013  FINDINGS: There is no evidence of fracture, dislocation, or joint effusion. There is no evidence of arthropathy or other focal bone abnormality. Soft tissues are unremarkable.  IMPRESSION: Normal exam.   Electronically Signed   By: Rozetta Nunnery M.D.   On: 10/22/2014 12:59   Course in Urgent [place]   The following medications were given:  Medications  HYDROcodone-acetaminophen (NORCO/VICODIN) 5-325 MG per tablet 2 tablet (2 tablets Oral Given 10/22/14 1151)   Assessment   The primary encounter diagnosis was Hip pain. Diagnoses of Knee pain and Bell's palsy were also pertinent to this visit.  Differential diagnosis hip pain includes impingement syndrome, labral tear, osteoarthritis, tendinitis, bursitis, or aseptic necrosis. Differential diagnosis of the knee and calf pain includes osteoarthritis, cartilage injury, tendinitis, or bursitis. It's also possible the patient could have fibromyalgia syndrome. Her  Bell's palsy is very mild. Since his been recurrent, sarcoidosis could be a possible causative factor. She has no appointment to see Dr. Halford Chessman in 3 days. I suggested she follow-up with him and mentioned these symptoms to him as well.  Plan     1.  Meds:  The following meds were prescribed:   Discharge Medication List as of 10/22/2014  2:11 PM    START taking these medications   Details  HYDROcodone-acetaminophen (NORCO/VICODIN) 5-325 MG per tablet 1 to 2 tabs every 4 to 6 hours as needed for pain., Print    predniSONE (DELTASONE) 20 MG tablet Take 3 daily for 5 days, 2 daily for 5 days, 1 daily for 5 days., Normal        2.  Patient Education/Counseling:  The patient was given appropriate handouts, self care instructions, and instructed in symptomatic relief.  Given exercises for the knees and hip.  3.  Follow up:  The patient was told to follow up here if no better in 3 to 4  days, or sooner if becoming worse in any way, and given some red flag symptoms such as worsening pain, fever, or new neurological symptoms which would prompt immediate return.  Follow up with Dr. Halford Chessman later on this week.        Harden Mo, MD 10/22/14 [phone]

## 2014-10-22 NOTE — Discharge Instructions (Signed)
Most hip pain is caused by osteoarthritis, bursitis, or tendonitis.  Simple measures plus regular gentle exercises can help.  Do not do the following:  Avoid squatting and doing deep knee bends.  This puts too much of load on your cartiledges and tendons.  If you do a knee bend, go only half way down, flexing your knee no more than 90 degrees.  Avoid sleeping on the side that hurts.  Do the following:  If you are overweight or obese, lose weight.  This makes for a lot less load on your hip joints.  If you use tobacco, quit.  Nicotine causes spasm of the small arteries, decreases blood flow, and impairs your body's normal ability to repair damage.  If your hip is acutely inflamed, use the principles of RICE (rest, ice, compression, and elevation).  Use of over the counter pain meds can be of help.  Tylenol (or acetaminophen) is the safest to use.  It often helps to take this regularly.  You can take up to 2 325 mg tablets 5 times daily, but it best to start out much lower that that, perhaps 2 325 mg tablets twice daily, then increase from there. People who are on the blood thinner warfarin have to be careful about taking high doses of Tylenol.  For people who are able to tolerate them, ibuprofen and naproxyn can also help with the pain.  You should discuss these agents with your physician before taking them.  People with chronic kidney disease, hypertension, peptic ulcer disease, and reflux can suffer adverse side effects. They should not be taken with warfarin. The maximum dosage of ibuprofen is 800 mg 3 times daily with meals.  The maximum dosage of naprosyn is 2 and 1/2 tablets twice daily with food, but again, start out low and gradually increase the dose until adequate pain relief is achieved. Ibuprofen and naprosyn should always be taken with food.  People with cartiledge injury or osteoarthritis may find glucosamine to be helpful.  This is an over-the-counter supplement that helps nourish and  repair cartiledge.  The dose is 500 mg 3 times daily or 1500 mg taken in a single dose. This can take several months to work and it doesn't always work.    For people with hip pain on just one side, use of a cane held in the hand on the same side as the hip pain takes some of the stress off the hip joint and can make a big difference in hip pain.  Wearing good shoes with adequate arch support is essential. Use of an orthotic insert can be very helpful.  These can be purchased at a shoe store or inexpensive inserts can be gotten at the drug store.  Regular exercise is of utmost importance.  Swimming, water aerobics, low impact aerobics, yoga or tai chi are helpful.  Use of an elliptical exerciser put the least stress on the hips of any type of exercise machine.  Finally doing the exercises below can be very helpful. Try to do them twice a day followed by ice for 10 minutes.         Knee pain can be caused by many conditions:  Osteoarthritis, gout, bursitis, tendonitis, cartiledge damage, condromalacia patella, patellofemoral syndrome, and ligament sprain to name just a few.  Often some simple conservative measures can help alleviate the pain.  Do not do the following:  Avoid squatting and doing deep knee bends.  This puts too much of load on your cartiledges  and tendons.  If you do a knee bend, go only half way down, flexing your knee no more than 90 degrees.  Do the following:  If you are overweight or obese, lose weight.  This makes for a lot less load on your knee joints.  If you use tobacco, quit.  Nicotine causes spasm of the small arteries, decreases blood flow, and impairs your body's normal ability to repair damage.  If your knee is acutely inflamed, use the principles of RICE (rest, ice, compression, and elevation).  Wearing a knee brace can help.  These are usually made of neoprene and can be purchased over the counter at the drug store.  Use of over the counter pain meds  can be of help.  Tylenol (or acetaminophen) is the safest to use.  It often helps to take this regularly.  You can take up to 2 325 mg tablets 5 times daily, but it best to start out much lower that that, perhaps 2 325 mg tablets twice daily, then increase from there. People who are on the blood thinner warfarin have to be careful about taking high doses of Tylenol.  For people who are able to tolerate them, ibuprofen and naproxyn can also help with the pain.  You should discuss these agents with your physician before taking them.  People with chronic kidney disease, hypertension, peptic ulcer disease, and reflux can suffer adverse side effects. They should not be taken with warfarin. The maximum dosage of ibuprofen is 800 mg 3 times daily with meals.  The maximum dosage of naprosyn is 2 and 1/2 tablets twice daily with food, but again, start out low and gradually increase the dose until adequate pain relief is achieved. Ibuprofen and naprosyn should always be taken with food.  People with cartiledge injury or osteoarthritis may find glucosamine to be helpful.  This is an over-the-counter supplement that helps nourish and repair cartiledge.  The dose is 500 mg 3 times daily or 1500 mg taken in a single dose. This can take several months to work and it doesn't always work.    For people with knee pain on just one side, use of a cane held in the hand on the same side as the knee pain takes some of the stress off the knee joint and can make a big difference in knee pain.  Wearing good shoes with adequate arch support is essential.  Regular exercise is of utmost importance.  Swimming, water aerobics, or use of an elliptical exerciser put the least stress on the knees of any exercise.  Finally doing the exercises below can be very helpful.  They tend to strengthen the muscles around the knee and provide extra support and stability.  Try to do them twice a day followed by ice for 10  minutes.        Bell's Palsy Bell's palsy is a condition in which the muscles on one side of the face cannot move (paralysis). This is because the nerves in the face are paralyzed. It is most often thought to be caused by a virus. The virus causes swelling of the nerve that controls movement on one side of the face. The nerve travels through a tight space surrounded by bone. When the nerve swells, it can be compressed by the bone. This results in damage to the protective covering around the nerve. This damage interferes with how the nerve communicates with the muscles of the face. As a result, it can cause weakness or  paralysis of the facial muscles.  Injury (trauma), tumor, and surgery may cause Bell's palsy, but most of the time the cause is unknown. It is a relatively common condition. It starts suddenly (abrupt onset) with the paralysis usually ending within 2 days. Bell's palsy is not dangerous. But because the eye does not close properly, you may need care to keep the eye from getting dry. This can include splinting (to keep the eye shut) or moistening with artificial tears. Bell's palsy very seldom occurs on both sides of the face at the same time. SYMPTOMS   Eyebrow sagging.  Drooping of the eyelid and corner of the mouth.  Inability to close one eye.  Loss of taste on the front of the tongue.  Sensitivity to loud noises. TREATMENT  The treatment is usually non-surgical. If the patient is seen within the first 24 to 48 hours, a short course of steroids may be prescribed, in an attempt to shorten the length of the condition. Antiviral medicines may also be used with the steroids, but it is unclear if they are helpful.  You will need to protect your eye, if you cannot close it. The cornea (clear covering over your eye) will become dry and can be damaged. Artificial tears can be used to keep your eye moist. Glasses or an eye patch should be worn to protect your eye. PROGNOSIS  Recovery  is variable, ranging from days to months. Although the problem usually goes away completely (about 80% of cases resolve), predicting the outcome is impossible. Most people improve within 3 weeks of when the symptoms began. Improvement may continue for 3 to 6 months. A small number of people have moderate to severe weakness that is permanent.  HOME CARE INSTRUCTIONS   If your caregiver prescribed medication to reduce swelling in the nerve, use as directed. Do not stop taking the medication unless directed by your caregiver.  Use moisturizing eye drops as needed to prevent drying of your eye, as directed by your caregiver.  Protect your eye, as directed by your caregiver.  Use facial massage and exercises, as directed by your caregiver.  Perform your normal activities, and get your normal rest. SEEK IMMEDIATE MEDICAL CARE IF:   There is pain, redness or irritation in the eye.  You or your child has an oral temperature above 102 F (38.9 C), not controlled by medicine. MAKE SURE YOU:   Understand these instructions.  Will watch your condition.  Will get help right away if you are not doing well or get worse. Document Released: 12/13/2005 Document Revised: 03/06/2012 Document Reviewed: 03/22/2014 [facility] Patient Information 2015 [place], [place]. This information is not intended to replace advice given to you by your health care provider. Make sure you discuss any questions you have with your health care provider. Sarcoidosis, Schaumann's Disease, Sarcoid of [place] Sarcoidosis appears briefly and heals naturally in 53 to 70 percent of cases, often without the patient knowing or doing anything about it. 20 to 30 percent of patients with sarcoidosis are left with some permanent lung damage. In 10 to 15 percent of the patients, sarcoidosis can become chronic (long lasting). When either the granulomas or fibrosis seriously affect the function of a vital organ (lungs, heart, nervous system, liver,  or kidneys), sarcoidosis can be fatal. This occurs 5 to 10 percent of the time. No one can predict how sarcoidosis will progress in an individual patient. The symptoms the patient experiences, the caregiver's findings, and the patient's race can give some  clues. Sarcoidosis was once considered a rare disease. We now know that it is a common chronic illness that appears all over the world. It is the most common of the fibrotic (scarring) lung disorders. Anyone can get sarcoidosis. It occurs in all races and in both sexes. The risk is greater if you are a young black adult, especially a black woman, or are of [place], [place], [place], or [place] origin. In sarcoidosis, small lumps (also called nodules or granulomas) develop in multiple organs of the body. These granulomas are small collections of inflamed cells. They commonly appear in the lungs. This is the most common organ affected. They also occur in the lymph nodes (your glands), skin, liver, and eyes. The granulomas vary in the amount of disease they produce from very little with no problems (symptoms) to causing severe illness. The cause of sarcoidosis is not known. It may be due to an abnormal immune reaction in the body. Most people will recover. A few people will develop long lasting conditions that may get worse. Women are affected more often than men. The majority of those affected are under 82 years of age. Because we do not know the cause, we do not have ways to prevent it. SYMPTOMS   Fever.  Loss of appetite.  Night sweats.  Joint pain.  Aching muscles Symptoms vary because the disease affects different parts of the body in different people. Most people who see their caregiver with sarcoidosis have lung problems. The first signs are usually a dry cough and shortness of breath. There may also be wheezing, chest pain, or a cough that brings up bloody mucus. In severe cases, lung function may become so poor that the person cannot  perform even the simple routine tasks of daily life. Other symptoms of sarcoidosis are less common than lung symptoms. They can include:  Skin symptoms. Sarcoidosis can appear as a collection of tender, red bumps called erythema nodosum. These bumps usually occur on the face, shins, and arms. They can also occur as a scaly, purplish discoloration on the nose, cheeks, and ears. This is called lupus pernio. Less often, sarcoidosis causes cysts, pimples, or disfiguring over growths of skin. In many cases, the disfiguring over growths develop in areas of scars or tattoos.  Eye symptoms. These include redness, eye pain, and sensitivity to light.  Heart symptoms. These include irregular heartbeat and heart failure.  Other symptoms. A person may have paralyzed facial muscles, seizures, psychiatric symptoms, swollen salivary glands, or bone pain. DIAGNOSIS  Even when there are no symptoms, your caregiver can sometimes pick up signs of sarcoidosis during a routine examination, usually through a chest x-ray or when checking other complaints. The patient's age and race or ethnic group can raise an additional red flag that a sign or symptom could be related to sarcoidosis.   Enlargement of the salivary or tear glands and cysts in bone tissue may also be caused by sarcoidosis.  You may have had a biopsy done that shows signs of sarcoidosis. A biopsy is a small tissue sample that is removed for laboratory testing. This tissue sample can be taken from your lung, skin, lip, or another inflamed or abnormal area of the body.  You may have had an abnormal chest X-ray. Although you appear healthy, a chest X-ray ordered for other reasons may turn up abnormalities that suggest sarcoidosis.  Other tests may be needed. These tests may be done to rule out other illnesses or to determine the amount of  organ damage caused by sarcoidosis. Some of the most common tests are:  Blood levels of calcium or angiotensin-converting  enzyme may be high in people with sarcoidosis.  Blood tests to evaluate how well your liver is functioning.  Lung function tests to measure how well you are breathing.  A complete eye examination. TREATMENT  If sarcoidosis does not cause any problems, treatment may not be necessary. Your caregiver may decide to simply monitor your condition. As part of this monitoring process, you may have frequent office visits, follow-up chest X-rays, and tests of your lung function.If you have signs of moderate or severe lung disease, your doctor may recommend:  A corticosteroid drug, such as prednisone (sold under several brand names).  Corticosteroids also are used to treat sarcoidosis of the eyes, joints, skin, nerves, or heart.  Corticosteroid eye drops may be used for the eyes.  Over-the-counter medications like nonsteroidal anti-inflammatory drugs (NSAID) often are used to treat joint pain first before corticosteroids, which tend to have more side effects.  If corticosteroids are not effective or cause serious side effects, other drugs that alter or suppress the immune system may be used.  In rare cases, when sarcoidosis causes life-threatening lung disease, a lung transplant may be necessary. However, there is some risk that the new lungs also will be attacked by sarcoidosis. SEEK IMMEDIATE MEDICAL CARE IF:   You suffer from shortness of breath or a lingering cough.  You develop new problems that may be related to the disease. Remember this disease can affect almost all organs of the body and cause many different problems. Document Released: 10/13/2004 Document Revised: 03/06/2012 Document Reviewed: 04/10/2014 [facility] Patient Information 2015 [place], [place]. This information is not intended to replace advice given to you by your health care provider. Make sure you discuss any questions you have with your health care provider.

## 2014-10-25 ENCOUNTER — Ambulatory Visit (INDEPENDENT_AMBULATORY_CARE_PROVIDER_SITE_OTHER): Payer: Medicare Other | Admitting: Pulmonary Disease

## 2014-10-25 VITALS — BP 140/92 | HR 64 | Temp 98.3°F | Ht 67.0 in | Wt 199.0 lb

## 2014-10-25 DIAGNOSIS — D860 Sarcoidosis of lung: Secondary | ICD-10-CM

## 2014-10-25 DIAGNOSIS — J4520 Mild intermittent asthma, uncomplicated: Secondary | ICD-10-CM

## 2014-10-25 DIAGNOSIS — M25551 Pain in right hip: Secondary | ICD-10-CM

## 2014-10-25 DIAGNOSIS — Z87891 Personal history of nicotine dependence: Secondary | ICD-10-CM

## 2014-10-25 DIAGNOSIS — Z720 Tobacco use: Secondary | ICD-10-CM

## 2014-10-25 NOTE — Progress Notes (Signed)
Chief Complaint  Patient presents with  . Follow-up    Pt reports quitting smoking x 2-3 months. Pt reports left eye blurriness and leg weakness x 3 months (seen by Eye Dr to eval). Pt state that she was seen in ED 10/22/14 for pain in hip and kidneys--was adivsed possibly something to do with Sarcoid.     History of Present Illness: Claudett Bayly is a 38 y.o. female former smoker with asthma, allergic rhinitis, and sarcoidosis (pulmonary, skin involvement, Bell's palsy) firsts dx in 2006.  She has not smoked for 3 months.  She feels her breathing has improved.  She is not longer having left shoulder pain.  Her main issue currently relates to Rt hip and flank pain.  She was seen in ER and told this could be related to sarcoidosis, but her hip xray was negative.  She was started on prednisone by the ER w/o improvement.  She was having headaches and blurred vision.  She saw eye doctor and was given reading glasses.  She has leakage of water in her walls.  She has noticed mildew build up >> her cough and headaches are worse when she is at home with this exposure.  TESTS: CT chest 12/30/04 >> b/l hilar and mediastinal adenopathy with b/l nodular interstitial opacities  CT chest 07/08/08 >> decreased lymph nodes, scarring with volume loss  RAST 07/22/11 >> Multiple allergens, IgE 71.8  PFT 09/02/11 >> FEV1 2.80(93%), FEV1% 82, TLC 4.74(89%), DLCO 59%, DLCO/VA 95%, no BD response.  PFT 09/25/13 >> FEV1 2.75 (95%), FEV1% 81, TLC 4.25 (77%), DLCO 64%, no BD response. CT chest 11/20/13 >>  Scattered areas of ATX CT chest 04/02/14 >> calcified Rt hilar LAN, scattered areas of scarring  Physical Exam:  General - No distress ENT - No sinus tenderness, no oral exudate, no LAN Cardiac - s1s2 regular, no murmur Chest - No wheeze/rales/dullness Back - No focal tenderness Abd - Soft, non-tender Ext - no edema Neuro - Normal strength Skin - no rashes Psych - normal mood, and  behavior   Assessment/Plan:  Chesley Mires, MD [place] Pager:  [phone]

## 2014-10-25 NOTE — Patient Instructions (Signed)
Follow up in 6 months 

## 2014-10-27 ENCOUNTER — Encounter: Payer: Self-pay | Admitting: Pulmonary Disease

## 2014-10-27 DIAGNOSIS — M25551 Pain in right hip: Secondary | ICD-10-CM | POA: Insufficient documentation

## 2014-10-27 NOTE — Assessment & Plan Note (Signed)
I do not think this is related to her hx of sarcoidosis.  She was previously seen by PCP, and will try to arrange for follow up with PCP to further assess.  Advised her to contact my office for assistance if she is not able to arrange PCP follow up on her own.

## 2014-10-27 NOTE — Assessment & Plan Note (Signed)
I don't think this is an active issue at present.

## 2014-10-27 NOTE — Assessment & Plan Note (Signed)
Her symptoms have improved since she stopped smoking.  Her main difficulty now relates to possible mildew exposure in her home.  She can continue with prn proair.

## 2014-10-27 NOTE — Assessment & Plan Note (Signed)
Congratulated her on smoking cessation.  

## 2014-10-28 ENCOUNTER — Encounter: Payer: Self-pay | Admitting: Pulmonary Disease

## 2014-11-18 ENCOUNTER — Emergency Department (HOSPITAL_COMMUNITY): Payer: Medicare Other

## 2014-11-18 ENCOUNTER — Encounter (HOSPITAL_COMMUNITY): Payer: Self-pay | Admitting: Emergency Medicine

## 2014-11-18 ENCOUNTER — Emergency Department (HOSPITAL_COMMUNITY)
Admission: EM | Admit: 2014-11-18 | Discharge: 2014-11-18 | Disposition: A | Discharge status: Home / Self Care | Payer: Medicare Other | Attending: Emergency Medicine | Admitting: Emergency Medicine

## 2014-11-18 DIAGNOSIS — Z8719 Personal history of other diseases of the digestive system: Secondary | ICD-10-CM | POA: Diagnosis not present

## 2014-11-18 DIAGNOSIS — Z7952 Long term (current) use of systemic steroids: Secondary | ICD-10-CM | POA: Diagnosis not present

## 2014-11-18 DIAGNOSIS — Z862 Personal history of diseases of the blood and blood-forming organs and certain disorders involving the immune mechanism: Secondary | ICD-10-CM | POA: Diagnosis not present

## 2014-11-18 DIAGNOSIS — Z87891 Personal history of nicotine dependence: Secondary | ICD-10-CM | POA: Diagnosis not present

## 2014-11-18 DIAGNOSIS — Z79899 Other long term (current) drug therapy: Secondary | ICD-10-CM | POA: Insufficient documentation

## 2014-11-18 DIAGNOSIS — Z8742 Personal history of other diseases of the female genital tract: Secondary | ICD-10-CM | POA: Insufficient documentation

## 2014-11-18 DIAGNOSIS — Y9389 Activity, other specified: Secondary | ICD-10-CM | POA: Insufficient documentation

## 2014-11-18 DIAGNOSIS — Z872 Personal history of diseases of the skin and subcutaneous tissue: Secondary | ICD-10-CM | POA: Diagnosis not present

## 2014-11-18 DIAGNOSIS — S6991XA Unspecified injury of right wrist, hand and finger(s), initial encounter: Secondary | ICD-10-CM | POA: Diagnosis not present

## 2014-11-18 DIAGNOSIS — J45909 Unspecified asthma, uncomplicated: Secondary | ICD-10-CM | POA: Diagnosis not present

## 2014-11-18 DIAGNOSIS — Y998 Other external cause status: Secondary | ICD-10-CM | POA: Diagnosis not present

## 2014-11-18 DIAGNOSIS — Z8669 Personal history of other diseases of the nervous system and sense organs: Secondary | ICD-10-CM | POA: Diagnosis not present

## 2014-11-18 DIAGNOSIS — Y9289 Other specified places as the place of occurrence of the external cause: Secondary | ICD-10-CM | POA: Insufficient documentation

## 2014-11-18 MED ORDER — HYDROCODONE-ACETAMINOPHEN 5-325 MG PO TABS
1.0000 | ORAL_TABLET | Freq: Once | ORAL | Status: AC
  Administered 2014-11-18: 1 via ORAL
  Filled 2014-11-18: qty 1

## 2014-11-18 MED ORDER — NAPROXEN 500 MG PO TABS: 500.0000 mg | ORAL_TABLET | Freq: Two times a day (BID) | ORAL | Status: DC

## 2014-11-18 NOTE — Discharge Instructions (Signed)
Your evaluated today in the ED for your right wrist pain. There does not appear to be an emergent cause for your pain. Your x-ray showed no evidence of broken bones, dislocations or other deformities. He may take naproxen as directed for pain and inflammation management. Please follow-up with your primary care for further evaluation and management. You have been provided a referral to Dr. Sharol Given in orthopedic surgery, you may follow-up with him as needed
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
## 2015-03-10 ENCOUNTER — Encounter (HOSPITAL_COMMUNITY): Payer: Self-pay

## 2015-03-10 ENCOUNTER — Emergency Department (INDEPENDENT_AMBULATORY_CARE_PROVIDER_SITE_OTHER)
Admission: EM | Admit: 2015-03-10 | Discharge: 2015-03-10 | Disposition: A | Discharge status: Nursing Home (Includes ICF) | Payer: Medicare Other | Source: Home / Self Care | Attending: Family Medicine | Admitting: Family Medicine

## 2015-03-10 ENCOUNTER — Encounter (HOSPITAL_COMMUNITY): Payer: Self-pay | Admitting: Emergency Medicine

## 2015-03-10 ENCOUNTER — Emergency Department (HOSPITAL_COMMUNITY)
Admission: EM | Admit: 2015-03-10 | Discharge: 2015-03-11 | Disposition: A | Discharge status: Home / Self Care | Payer: Medicare Other | Attending: Emergency Medicine | Admitting: Emergency Medicine

## 2015-03-10 DIAGNOSIS — Z872 Personal history of diseases of the skin and subcutaneous tissue: Secondary | ICD-10-CM | POA: Insufficient documentation

## 2015-03-10 DIAGNOSIS — R112 Nausea with vomiting, unspecified: Secondary | ICD-10-CM | POA: Diagnosis not present

## 2015-03-10 DIAGNOSIS — J45909 Unspecified asthma, uncomplicated: Secondary | ICD-10-CM | POA: Insufficient documentation

## 2015-03-10 DIAGNOSIS — Z3202 Encounter for pregnancy test, result negative: Secondary | ICD-10-CM | POA: Insufficient documentation

## 2015-03-10 DIAGNOSIS — R42 Dizziness and giddiness: Secondary | ICD-10-CM

## 2015-03-10 DIAGNOSIS — Z79899 Other long term (current) drug therapy: Secondary | ICD-10-CM | POA: Insufficient documentation

## 2015-03-10 DIAGNOSIS — Z8669 Personal history of other diseases of the nervous system and sense organs: Secondary | ICD-10-CM | POA: Insufficient documentation

## 2015-03-10 DIAGNOSIS — Z8719 Personal history of other diseases of the digestive system: Secondary | ICD-10-CM | POA: Diagnosis not present

## 2015-03-10 DIAGNOSIS — Z7952 Long term (current) use of systemic steroids: Secondary | ICD-10-CM | POA: Diagnosis not present

## 2015-03-10 DIAGNOSIS — Z87448 Personal history of other diseases of urinary system: Secondary | ICD-10-CM | POA: Insufficient documentation

## 2015-03-10 DIAGNOSIS — Z87891 Personal history of nicotine dependence: Secondary | ICD-10-CM | POA: Insufficient documentation

## 2015-03-10 LAB — CBC WITH DIFFERENTIAL/PLATELET
Basophils Absolute: 0.1 10*3/uL (ref 0.0–0.1)
Basophils Relative: 1 % (ref 0–1)
EOS ABS: 0.3 10*3/uL (ref 0.0–0.7)
EOS PCT: 5 % (ref 0–5)
HCT: 36.9 % (ref 36.0–46.0)
HEMOGLOBIN: 11.8 g/dL — AB (ref 12.0–15.0)
LYMPHS ABS: 1.3 10*3/uL (ref 0.7–4.0)
LYMPHS PCT: 24 % (ref 12–46)
MCH: 25.5 pg — ABNORMAL LOW (ref 26.0–34.0)
MCHC: 32 g/dL (ref 30.0–36.0)
MCV: 79.7 fL (ref 78.0–100.0)
Monocytes Absolute: 0.5 10*3/uL (ref 0.1–1.0)
Monocytes Relative: 10 % (ref 3–12)
Neutro Abs: 3.1 10*3/uL (ref 1.7–7.7)
Neutrophils Relative %: 60 % (ref 43–77)
Platelets: 277 10*3/uL (ref 150–400)
RBC: 4.63 MIL/uL (ref 3.87–5.11)
RDW: 13 % (ref 11.5–15.5)
WBC: 5.1 10*3/uL (ref 4.0–10.5)

## 2015-03-10 LAB — BASIC METABOLIC PANEL
Anion gap: 7 (ref 5–15)
BUN: 7 mg/dL (ref 6–23)
CO2: 24 mmol/L (ref 19–32)
Calcium: 9.6 mg/dL (ref 8.4–10.5)
Chloride: 107 mmol/L (ref 96–112)
Creatinine, Ser: 0.58 mg/dL (ref 0.50–1.10)
GLUCOSE: 129 mg/dL — AB (ref 70–99)
POTASSIUM: 3.3 mmol/L — AB (ref 3.5–5.1)
Sodium: 138 mmol/L (ref 135–145)

## 2015-03-10 LAB — POC URINE PREG, ED: PREG TEST UR: NEGATIVE

## 2015-03-10 MED ORDER — ONDANSETRON HCL 4 MG/2ML IJ SOLN
4.0000 mg | Freq: Once | INTRAMUSCULAR | Status: AC
  Administered 2015-03-10: 4 mg via INTRAVENOUS
  Filled 2015-03-10: qty 2

## 2015-03-10 MED ORDER — SODIUM CHLORIDE 0.9 % IJ SOLN
INTRAMUSCULAR | Status: AC
Start: 2015-03-10 — End: 2015-03-10
  Filled 2015-03-10: qty 6

## 2015-03-10 MED ORDER — MECLIZINE HCL 25 MG PO TABS
25.0000 mg | ORAL_TABLET | Freq: Once | ORAL | Status: AC
  Administered 2015-03-10: 25 mg via ORAL
  Filled 2015-03-10: qty 1

## 2015-03-10 MED ORDER — SODIUM CHLORIDE 0.9 % IV BOLUS (SEPSIS)
1000.0000 mL | Freq: Once | INTRAVENOUS | Status: AC
  Administered 2015-03-10: 1000 mL via INTRAVENOUS

## 2015-03-10 MED ORDER — SODIUM CHLORIDE 0.9 % IV SOLN: Freq: Once | INTRAVENOUS | Status: DC

## 2015-03-10 MED ORDER — MECLIZINE HCL 25 MG PO TABS: 25.0000 mg | ORAL_TABLET | Freq: Three times a day (TID) | ORAL | Status: DC | PRN

## 2015-03-10 NOTE — Discharge Instructions (Signed)

## 2015-03-10 NOTE — ED Provider Notes (Signed)
CSN: [id number]     Arrival date & time 03/10/15  2036 History   First MD Initiated Contact with Patient 03/10/15 2038     Chief Complaint  Patient presents with  . Dizziness     (Consider location/radiation/quality/duration/timing/severity/associated sxs/prior Treatment) Patient is a 39 y.o. female presenting with dizziness.  Dizziness Quality:  Head spinning Severity:  Severe Onset quality:  Sudden Duration:  16 hours Timing:  Intermittent Progression:  Waxing and waning Chronicity:  New Context: bending over, eye movement and head movement   Context: not with ear pain and not with loss of consciousness   Relieved by:  Being still Worsened by:  Nothing Ineffective treatments:  None tried Associated symptoms: nausea and vomiting   Associated symptoms: no chest pain, no diarrhea, no shortness of breath and no weakness   Vomiting:    Quality:  Stomach contents   Severity:  Mild   Duration:  3 days   Timing:  Intermittent   Progression:  Unable to specify Risk factors: no hx of stroke, no hx of vertigo, no Meniere's disease and no multiple medications     Past Medical History  Diagnosis Date  . Seasonal allergic rhinitis   . Eczema   . Interstitial cystitis   . Allergic asthma   . Sarcoidosis of lung     DX 2006--  PULMOLOGIST--  DR CNOB  . Shortness of breath   . History of Bell's palsy     2006  RIGHT SIDE--  RESOLVED  . History of anal fissures   . History of gastroesophageal reflux (GERD)   . GERD (gastroesophageal reflux disease)    Past Surgical History  Procedure Laterality Date  . Dilation and curettage of uterus  1997  . Cystoscopy/ hydrodistention/ bladder bx  06-09-2010  . Cysto with hydrodistension N/A 08/24/2013    Procedure: CYSTOSCOPY/HYDRODISTENSION with instillation of marcaine and pyridium;  Surgeon: Fredricka Bonine, MD;  Location: [facility];  Service: Urology;  Laterality: N/A;   Family History  Problem Relation Age of  Onset  . Brain cancer Maternal Grandmother    History  Substance Use Topics  . Smoking status: Former Smoker -- 0.30 packs/day for 6 years    Types: Cigarettes  . Smokeless tobacco: Never Used     Comment: ONE CIG. DAILY /  1 PPMONTH  . Alcohol Use: No   OB History    Gravida Para Term Preterm AB TAB SAB Ectopic Multiple Living   1 0 0 0 1 0 1 0 0 0      Review of Systems  Constitutional: Negative for fever and chills.  HENT: Negative for nosebleeds.   Eyes: Negative for visual disturbance.  Respiratory: Negative for cough and shortness of breath.   Cardiovascular: Negative for chest pain.  Gastrointestinal: Positive for nausea and vomiting. Negative for abdominal pain, diarrhea and constipation.  Genitourinary: Negative for dysuria.  Skin: Negative for rash.  Neurological: Positive for dizziness. Negative for weakness.  All other systems reviewed and are negative.     Allergies  Citric acid; Dust mite extract; Pollen extract; and Nickel  Home Medications   Prior to Admission medications   Medication Sig Start Date End Date Taking? Authorizing Provider  albuterol (PROAIR HFA) 108 (90 BASE) MCG/ACT inhaler Inhale 2 puffs into the lungs every 6 (six) hours as needed for shortness of breath. 11/19/13   Chesley Mires, MD  HYDROcodone-acetaminophen (NORCO/VICODIN) 5-325 MG per tablet 1 to 2 tabs every 4 to 6 hours  as needed for pain. 10/22/14   Harden Mo, MD  hydrocortisone valerate cream (WESTCORT) 0.2 % Apply 1 application topically 2 (two) times daily.    Historical Provider, MD  hydrOXYzine (ATARAX/VISTARIL) 10 MG tablet Take 10 mg by mouth at bedtime as needed for itching.    Historical Provider, MD  ibuprofen (ADVIL,MOTRIN) 600 MG tablet TAKE ONE TABLET BY MOUTH EVERY 6 HOURS AS NEEDED FOR MODERATE TO SEVERE PAIN 10/15/14   Manya Silvas, CNM  naproxen (NAPROSYN) 500 MG tablet Take 1 tablet (500 mg total) by mouth 2 (two) times daily. 11/18/14   Comer Locket, PA-C   polyethylene glycol (MIRALAX / GLYCOLAX) packet Take 17 g by mouth daily.    Historical Provider, MD  predniSONE (DELTASONE) 20 MG tablet Take 3 daily for 5 days, 2 daily for 5 days, 1 daily for 5 days. 10/22/14   Harden Mo, MD  Spacer/Aero-Holding Chambers (AEROCHAMBER MV) inhaler Use as instructed 04/29/14   Chesley Mires, MD  traMADol (ULTRAM) 50 MG tablet Take 50 mg by mouth every 6 (six) hours as needed.    Historical Provider, MD   BP 103/62 mmHg  Pulse 63  Temp(Src) 98.3 F (36.8 C) (Oral)  Resp 16  Ht 5\' 7"  (1.702 m)  Wt 174 lb (78.926 kg)  BMI 27.25 kg/m2  SpO2 97%  LMP 03/07/2015 Physical Exam  Constitutional: She is oriented to person, place, and time. No distress.  HENT:  Head: Normocephalic and atraumatic.  Eyes: EOM are normal. Pupils are equal, round, and reactive to light.  Neck: Normal range of motion. Neck supple.  Cardiovascular: Normal rate and intact distal pulses.   Pulmonary/Chest: No respiratory distress.  Abdominal: Soft. There is no tenderness.  Musculoskeletal: Normal range of motion.  Neurological: She is alert and oriented to person, place, and time.  Skin: No rash noted. She is not diaphoretic.  Psychiatric: She has a normal mood and affect.    Cranial Nerves  II:  Visual fields Intact, pupillary equal round and reactive to light III, IV, VI: Full eye movement without nystagmus  V Facial Sensation: Normal. No weakness of masticatory muscles  VII: No facial weakness or asymmetry  VIII Auditory Acuity: Grossly normal  IX/X: The uvula is midline; the palate elevates symmetrically  XI: Normal sternocleidomastoid and trapezius strength  XII: The tongue is midline. No atrophy or fasciculations.  Motor System: Muscle Strength: 5/5 and symmetric in the upper and lower extremities. No pronation or drift.  Muscle Tone: Tone and muscle bulk are normal in the upper and lower extremities.  Reflexes: DTRs: 2+ and symmetrical in all four extremities.   Coordination: Intact finger-to-nose, heel-to-shin, and rapid alternating movements. No tremor.  Sensation: Intact to light touch. Negative Romberg test.  Gait: Routine and tandem gait are normal     ED Course  Procedures (including critical care time) Labs Review Labs Reviewed  CBC WITH DIFFERENTIAL/PLATELET - Abnormal; Notable for the following:    Hemoglobin 11.8 (*)    MCH 25.5 (*)    All other components within normal limits  BASIC METABOLIC PANEL - Abnormal; Notable for the following:    Potassium 3.3 (*)    Glucose, Bld 129 (*)    All other components within normal limits  POC URINE PREG, ED    Imaging Review No results found.   EKG Interpretation None      MDM   Final diagnoses:  None    39 y/o female w/ no sig PMH presents  with intermittent dizziness since this morning.  For the past three days she has had multiple episodes of NBNB emesis.  This morning she started having intermittent periods of room spinning that seem to be made worse with head movement.  Patient presented to urgent care and was sent to the Ed.  Exam as above, VSS.  EKG obtained and unremarkable.  Basic labs obtained and unremarkable.  Will give antivert.  After antivert and fluids, patient feeling better.  Continues to have benign exam.  Not feeling dizzy at rest, doubt central cause of her vertigo and feel safe for d/c at this time.  I have discussed the results, Dx and Tx plan with the patient. They expressed understanding and agree with the plan and were told to return to ED with any worsening of condition or concern.    Disposition: Discharge  Condition: Good  Discharge Medication List as of 03/10/2015 11:29 PM    START taking these medications   Details  meclizine (ANTIVERT) 25 MG tablet Take 1 tablet (25 mg total) by mouth 3 (three) times daily as needed for dizziness., Starting 03/10/2015, Until Discontinued, Print        Follow Up: No follow-up provider specified.  Pt  seen in conjunction with Dr. Alecia Lemming, MD 03/12/15 [place], MD 03/12/15 [phone]

## 2015-03-10 NOTE — ED Notes (Signed)
[facility] was called for transport to the ED.  ED Charge RN, Randall Hiss was called and given report.

## 2015-03-10 NOTE — ED Notes (Signed)
MD at bedside. 

## 2015-03-10 NOTE — ED Provider Notes (Signed)
Rhonda Hester is a 39 y.o. female who presents to Urgent Care today for vertigo. Patient awoke this morning with severe vertigo. She's had several falls due to dizziness. She denies hitting her head. The vertigo is persistent throughout the entire day. The vertigo is worsened with head motion. She's vomited several times. She denies any hearing change or tinnitus. No treatment tried yet.    Past Medical History  Diagnosis Date  . Seasonal allergic rhinitis   . Eczema   . Interstitial cystitis   . Allergic asthma   . Sarcoidosis of lung     DX 2006--  PULMOLOGIST--  DR QQPY  . Shortness of breath   . History of Bell's palsy     2006  RIGHT SIDE--  RESOLVED  . History of anal fissures   . History of gastroesophageal reflux (GERD)   . GERD (gastroesophageal reflux disease)    Past Surgical History  Procedure Laterality Date  . Dilation and curettage of uterus  1997  . Cystoscopy/ hydrodistention/ bladder bx  06-09-2010  . Cysto with hydrodistension N/A 08/24/2013    Procedure: CYSTOSCOPY/HYDRODISTENSION with instillation of marcaine and pyridium;  Surgeon: Fredricka Bonine, MD;  Location: [facility];  Service: Urology;  Laterality: N/A;   History  Substance Use Topics  . Smoking status: Former Smoker -- 0.30 packs/day for 6 years    Types: Cigarettes  . Smokeless tobacco: Never Used     Comment: ONE CIG. DAILY /  1 PPMONTH  . Alcohol Use: No   ROS as above Medications: No current facility-administered medications for this encounter.   Current Outpatient Prescriptions  Medication Sig Dispense Refill  . traMADol (ULTRAM) 50 MG tablet Take 50 mg by mouth every 6 (six) hours as needed.    Marland Kitchen albuterol (PROAIR HFA) 108 (90 BASE) MCG/ACT inhaler Inhale 2 puffs into the lungs every 6 (six) hours as needed for shortness of breath. 1 Inhaler 5  . HYDROcodone-acetaminophen (NORCO/VICODIN) 5-325 MG per tablet 1 to 2 tabs every 4 to 6 hours as needed for pain. 20  tablet 0  . hydrocortisone valerate cream (WESTCORT) 0.2 % Apply 1 application topically 2 (two) times daily.    . hydrOXYzine (ATARAX/VISTARIL) 10 MG tablet Take 10 mg by mouth at bedtime as needed for itching.    Marland Kitchen ibuprofen (ADVIL,MOTRIN) 600 MG tablet TAKE ONE TABLET BY MOUTH EVERY 6 HOURS AS NEEDED FOR MODERATE TO SEVERE PAIN 30 tablet 0  . naproxen (NAPROSYN) 500 MG tablet Take 1 tablet (500 mg total) by mouth 2 (two) times daily. 30 tablet 0  . polyethylene glycol (MIRALAX / GLYCOLAX) packet Take 17 g by mouth daily.    . predniSONE (DELTASONE) 20 MG tablet Take 3 daily for 5 days, 2 daily for 5 days, 1 daily for 5 days. 30 tablet 0  . Spacer/Aero-Holding Chambers (AEROCHAMBER MV) inhaler Use as instructed 1 each 0   Facility-Administered Medications Ordered in Other Encounters  Medication Dose Route Frequency Provider Last Rate Last Dose  . bupivacaine (MARCAINE) 0.5 % 15 mL, phenazopyridine (PYRIDIUM) 400 mg bladder mixture   Bladder Instillation Once Festus Aloe, MD       Allergies  Allergen Reactions  . Citric Acid Other (See Comments)    AVOIDS DUE TO INTERSTITIAL CYSTITIS  . Dust Mite Extract Itching and Other (See Comments)     coughing  . Pollen Extract Itching and Other (See Comments)     coughing  . Nickel Rash  Exam:  BP 129/92 mmHg  Pulse 75  Temp(Src) 98.1 F (36.7 C) (Oral)  Resp 20  SpO2 99%  LMP 03/07/2015 (Approximate) Gen: Well NAD HEENT: EOMI,  MMM PERRLA Lungs: Normal work of breathing. CTABL Heart: RRR no MRG Abd: NABS, Soft. Nondistended, Nontender Exts: Brisk capillary refill, warm and well perfused.  Neuro: Patient is unable to sit up by herself due to vertigo. Face is symmetrical  No results found for this or any previous visit (from the past 24 hour(s)). No results found.  Assessment and Plan: 39 y.o. female with vertigo. Vertigo is persistent and severe. We'll transfer to the emergency department via EMS as patient is a fall risk  at the time. Will place an IV. I'm concerned about labyrinthitis or CVA.  Discussed warning signs or symptoms. Please see discharge instructions. Patient expresses understanding.     Gregor Hams, MD 03/10/15 [phone]

## 2015-03-10 NOTE — ED Notes (Signed)
Per [facility] pt has been vomiting for the past 3 days started getting dizzy spells this am at 5:30pm; Pt went to UC with c/o dizzy and was sent to ER from UC. Pt states she has been drinking a lot of fluids today but sometimes fluids has come back up; pt c/o 8/10 in temples;

## 2015-03-10 NOTE — ED Notes (Signed)
Pt states she has had a cough for several weeks, but she woke up this morning and was unable to walk around due to extreme dizziness.

## 2015-05-15 ENCOUNTER — Ambulatory Visit (INDEPENDENT_AMBULATORY_CARE_PROVIDER_SITE_OTHER)
Admission: RE | Admit: 2015-05-15 | Discharge: 2015-05-15 | Disposition: A | Discharge status: Home / Self Care | Payer: Medicare Other | Source: Ambulatory Visit | Attending: Pulmonary Disease | Admitting: Pulmonary Disease

## 2015-05-15 ENCOUNTER — Ambulatory Visit (INDEPENDENT_AMBULATORY_CARE_PROVIDER_SITE_OTHER): Payer: Medicare Other | Admitting: Pulmonary Disease

## 2015-05-15 ENCOUNTER — Encounter (INDEPENDENT_AMBULATORY_CARE_PROVIDER_SITE_OTHER): Payer: Self-pay

## 2015-05-15 ENCOUNTER — Encounter: Payer: Self-pay | Admitting: Pulmonary Disease

## 2015-05-15 VITALS — BP 110/78 | HR 87 | Ht 67.0 in | Wt 195.2 lb

## 2015-05-15 DIAGNOSIS — D869 Sarcoidosis, unspecified: Secondary | ICD-10-CM | POA: Diagnosis not present

## 2015-05-15 DIAGNOSIS — F17200 Nicotine dependence, unspecified, uncomplicated: Secondary | ICD-10-CM

## 2015-05-15 DIAGNOSIS — Z720 Tobacco use: Secondary | ICD-10-CM | POA: Diagnosis not present

## 2015-05-15 DIAGNOSIS — J4530 Mild persistent asthma, uncomplicated: Secondary | ICD-10-CM | POA: Diagnosis not present

## 2015-05-15 MED ORDER — ALBUTEROL SULFATE HFA 108 (90 BASE) MCG/ACT IN AERS: 2.0000 | INHALATION_SPRAY | Freq: Four times a day (QID) | RESPIRATORY_TRACT | Status: DC | PRN

## 2015-05-15 MED ORDER — PREDNISONE 10 MG PO TABS: ORAL_TABLET | ORAL | Status: DC

## 2015-05-15 MED ORDER — MOMETASONE FURO-FORMOTEROL FUM 100-5 MCG/ACT IN AERO: 2.0000 | INHALATION_SPRAY | Freq: Two times a day (BID) | RESPIRATORY_TRACT | Status: DC

## 2015-05-15 NOTE — Patient Instructions (Signed)
Prednisone 10 mg pills >> 3 pills daily for 2 days, 2 pills daily for 2 days, 1 pill daily for 2 days Dulera two puffs twice per day, and rinse mouth after each use Proair two puffs every four to six hours as needed Chest xray today Follow up in 2 weeks with Dr. Halford Chessman or Tammy Parrett

## 2015-05-15 NOTE — Progress Notes (Signed)
Chief Complaint  Patient presents with  . Follow-up    Pt c/o increased cough, wheezing and SOB. Pt states that she has a lot of light green phlegm. Feels tense in the mornings and lungs are tight. Unable to sleep on back bc of diffculty breathing.    History of Present Illness: Rhonda Hester is a 39 y.o. female former smoker with asthma, allergic rhinitis, and sarcoidosis (pulmonary, skin involvement, Bell's palsy) firsts dx in 2006.  She has been getting more cough and chest congestion.  She has some wheezing and notices feeling short of breath with activity.  She is coughing up clear to green/yellow sputum.    She denies fever, sinus congestion, sore throat, hemoptysis, abdominal pain, skin rash, or leg swelling.  She has not been using inhalers or taking prednisone.  TESTS: CT chest 12/30/04 >> b/l hilar and mediastinal adenopathy with b/l nodular interstitial opacities  CT chest 07/08/08 >> decreased lymph nodes, scarring with volume loss  RAST 07/22/11 >> Multiple allergens, IgE 71.8  PFT 09/02/11 >> FEV1 2.80(93%), FEV1% 82, TLC 4.74(89%), DLCO 59%, DLCO/VA 95%, no BD response.  PFT 09/25/13 >> FEV1 2.75 (95%), FEV1% 81, TLC 4.25 (77%), DLCO 64%, no BD response. CT chest 11/20/13 >>  Scattered areas of ATX CT chest 04/02/14 >> calcified Rt hilar LAN, scattered areas of scarring  Physical Exam: Blood pressure 110/78, pulse 87, height 5\' 7"  (1.702 m), weight 195 lb 3.2 oz (88.542 kg), SpO2 98 %. Body mass index is 30.57 kg/(m^2).  General - No distress ENT - No sinus tenderness, no oral exudate, no LAN Cardiac - s1s2 regular, no murmur Chest - b/l faint expiratory wheezing Back - No focal tenderness Abd - Soft, non-tender Ext - no edema Neuro - Normal strength Skin - no rashes Psych - normal mood, and behavior   Assessment/Plan:  Acute asthma exacerbation. Plan: - will give tapering course of prednisone - I don't think she needs antibiotics for bacterial infection  at this time - will have her use dulera >> finances have been an issue with her getting medications - proair as needed  Sarcoidosis. I don't think her current symptoms are related to sarcoidosis flare. Plan: - will get chest xray to further assess   Chesley Mires, MD [place] [facility]Critical [facility]/Sleep Pager:  [phone]

## 2015-05-16 ENCOUNTER — Telehealth: Payer: Self-pay | Admitting: Pulmonary Disease

## 2015-05-16 NOTE — Telephone Encounter (Signed)
Pt returned call [phone]

## 2015-05-16 NOTE — Telephone Encounter (Signed)
Called and spoke to pt. Pt stated she needs [place] to call back. WCB.

## 2015-05-16 NOTE — Telephone Encounter (Signed)
Results have been explained to patient, pt expressed understanding. Nothing further needed.  

## 2015-05-16 NOTE — Telephone Encounter (Signed)
Called pt, stated that she could not hear me and would have to call us back at a later time.  Will await call.

## 2015-05-16 NOTE — Telephone Encounter (Signed)
Dg Chest 2 View  05/16/2015   CLINICAL DATA:  Substernal chest pain for 1 month, history of sarcoidosis  EXAM: CHEST  2 VIEW  COMPARISON:  01/20/2014, 07/09/2014  FINDINGS: Cardiac shadow is within normal limits. Stable scarring is noted in the left perihilar region as well as within the right lung apex. This is consistent with the patient's given clinical history of sarcoidosis. No new focal infiltrate is seen. No sizable effusion is noted. No acute bony abnormality is noted.  IMPRESSION: Stable scarring bilaterally.  No acute abnormality seen.   Electronically Signed   By: Inez Catalina M.D.   On: 05/16/2015 09:25    Will have my nurse inform pt that CXR looks stable.  No evidence for progression of sarcoidosis.  No change to tx plan.

## 2015-05-29 ENCOUNTER — Ambulatory Visit (INDEPENDENT_AMBULATORY_CARE_PROVIDER_SITE_OTHER): Payer: Medicare Other | Admitting: Adult Health

## 2015-05-29 ENCOUNTER — Encounter: Payer: Self-pay | Admitting: Adult Health

## 2015-05-29 VITALS — BP 122/72 | HR 93 | Ht 67.0 in | Wt 194.0 lb

## 2015-05-29 DIAGNOSIS — J45909 Unspecified asthma, uncomplicated: Secondary | ICD-10-CM

## 2015-05-29 DIAGNOSIS — D860 Sarcoidosis of lung: Secondary | ICD-10-CM

## 2015-05-29 MED ORDER — AEROCHAMBER MV MISC: Status: DC

## 2015-05-29 NOTE — Assessment & Plan Note (Signed)
Recent flare now resolve  Cont on current regimen  follow up Dr. Halford Chessman  In 3 months and As needed

## 2015-05-29 NOTE — Progress Notes (Signed)
   Subjective:    Patient ID: Rhonda Hester, female    DOB: Feb 10, 1976, 39 y.o.   MRN: [id number]  HPI  39 y.o. female former smoker with asthma, allergic rhinitis, and sarcoidosis (pulmonary, skin involvement, Bell's palsy) firsts dx in 2006.  05/29/2015 Follow up -2 week follow up for sarcoid and asthma  Seen last ov with sarcoid/asthma flare , tx w/ steroid taper.  She is feeling better with less dyspnea .  Does have some lingering symptoms  wheezing; prod cough in am and before bed w/white mucus But seems to be going away  CXR last chronic changes .  No fever, discolored mucus or chest pain.  No hemoptysis , or joint swelling  Has had some visual changes has eye appointment today .    TESTS: CT chest 12/30/04 >> b/l hilar and mediastinal adenopathy with b/l nodular interstitial opacities  CT chest 07/08/08 >> decreased lymph nodes, scarring with volume loss  RAST 07/22/11 >> Multiple allergens, IgE 71.8  PFT 09/02/11 >> FEV1 2.80(93%), FEV1% 82, TLC 4.74(89%), DLCO 59%, DLCO/VA 95%, no BD response.  PFT 09/25/13 >> FEV1 2.75 (95%), FEV1% 81, TLC 4.25 (77%), DLCO 64%, no BD response. CT chest 11/20/13 >>  Scattered areas of ATX CT chest 04/02/14 >> calcified Rt hilar LAN, scattered areas of scarring    Review of Systems Constitutional:   No  weight loss, night sweats,  Fevers, chills, fatigue, or  lassitude.  HEENT:   No headaches,  Difficulty swallowing,  Tooth/dental problems, or  Sore throat,                No sneezing, itching, ear ache, nasal congestion, post nasal drip,   CV:  No chest pain,  Orthopnea, PND, swelling in lower extremities, anasarca, dizziness, palpitations, syncope.   GI  No heartburn, indigestion, abdominal pain, nausea, vomiting, diarrhea, change in bowel habits, loss of appetite, bloody stools.   Resp:  .  No chest wall deformity  Skin: no new rash or lesions.  GU: no dysuria, change in color of urine, no urgency or frequency.  No flank pain, no  hematuria   MS:  No joint pain or swelling.  No decreased range of motion.  No back pain.  Psych:  No change in mood or affect. No depression or anxiety.  No memory loss.         Objective:   Physical Exam GEN: A/Ox3; pleasant , NAD  HEENT:  [place]/AT,  EACs-clear, TMs-wnl, NOSE-clear, THROAT-clear, no lesions, no postnasal drip or exudate noted.   NECK:  Supple w/ fair ROM; no JVD; normal carotid impulses w/o bruits; no thyromegaly or nodules palpated; no lymphadenopathy.  RESP  Clear  P & A; w/o, wheezes/ rales/ or rhonchi.no accessory muscle use, no dullness to percussion  CARD:  RRR, no m/r/g  , no peripheral edema, pulses intact, no cyanosis or clubbing.  GI:   Soft & nt; nml bowel sounds; no organomegaly or masses detected.  Musco: Warm bil, no deformities or joint swelling noted.   Neuro: alert, no focal deficits noted.    Skin: Warm         Assessment & Plan:

## 2015-05-29 NOTE — Patient Instructions (Signed)
Dulera two puffs twice per day, and rinse mouth after each use Proair two puffs every four to six hours as needed Follow up in 3-4 months and As needed

## 2015-05-29 NOTE — Assessment & Plan Note (Signed)
Recent flare now resolved  cxr stable changes Keep eye appointment .   Plan  Dulera two puffs twice per day, and rinse mouth after each use Proair two puffs every four to six hours as needed Follow up in 3-4 months and As needed

## 2015-05-29 NOTE — Progress Notes (Signed)
Reviewed and agree with assessment/plan. 

## 2015-08-28 ENCOUNTER — Telehealth: Payer: Self-pay | Admitting: Pulmonary Disease

## 2015-08-28 NOTE — Telephone Encounter (Signed)
Spoke with pt. She is asking if VS will renew her handicap sticker. She reports he has never done this for her but her PCP did but she is no longer in practice. Per pt she has this bc she can't walk far without getting tired and SOB. Please advise Dr. Halford Chessman thanks

## 2015-08-29 NOTE — Telephone Encounter (Signed)
Patient's last OV: 05/29/15 with TP Patient wants to know if she can have a handicap sticker form completed. Dr. Halford Chessman, ok to complete handicap forms for your signature?  Please advise.

## 2015-08-29 NOTE — Telephone Encounter (Signed)
Spoke with pt, aware of below recs.  Nothing further needed.  

## 2015-08-29 NOTE — Telephone Encounter (Signed)
Please explain to her that the severity of her lung disease is not bad enough for her to meet criteria to qualify for handicap parking sticker.  She can check with her primary care doctor if there are other health issues that would qualify her for handicap parking sticker.

## 2015-09-02 ENCOUNTER — Telehealth: Payer: Self-pay | Admitting: Pulmonary Disease

## 2015-09-02 NOTE — Telephone Encounter (Signed)
Chesley Mires, MD at 08/29/2015 1:04 PM     Status: Signed       Expand All Collapse All   Please explain to her that the severity of her lung disease is not bad enough for her to meet criteria to qualify for handicap parking sticker.  She can check with her primary care doctor if there are other health issues that would qualify her for handicap parking sticker.       Pt is aware of statement from VS above-states her PCP gave her Handicap placard due to Sarcoidosis causing her legs to cramp(pt states she was told that Sarcoid is affecting her joints as well) and give out-states she was unable to walk well enough recently due to leg swelling. Pt is aware that VS is not back until Monday-pt continued to state she needs to get this handicap placard asap as she has 10 days to get one again as she parked in handicap parking and got a ticket (was given ticket on 08-27-15). Pt did not want to wait until Monday for VS to return.   Pt is trying to get a new PCP in the meantime.   Will forward to BQ as DOD for pm office since VS is not back until Monday.

## 2015-09-03 NOTE — Telephone Encounter (Signed)
Called and spoke with pt and she is aware of BQ recs.  Will forward to VS to find out about handicap placard per pts request.  She is aware that VS is not back in the office until Monday.  VS please advise. Thanks  Allergies  Allergen Reactions  . Citric Acid Other (See Comments)    AVOIDS DUE TO INTERSTITIAL CYSTITIS  . Dust Mite Extract Itching and Other (See Comments)     coughing  . Pollen Extract Itching and Other (See Comments)     coughing  . Nickel Rash   Current Outpatient Prescriptions on File Prior to Visit  Medication Sig Dispense Refill  . albuterol (PROAIR HFA) 108 (90 BASE) MCG/ACT inhaler Inhale 2 puffs into the lungs every 6 (six) hours as needed for shortness of breath. 1 Inhaler 5  . hydrocortisone valerate cream (WESTCORT) 0.2 % Apply 1 application topically 2 (two) times daily.    . hydrOXYzine (ATARAX/VISTARIL) 10 MG tablet Take 10 mg by mouth at bedtime as needed for itching.    . mometasone-formoterol (DULERA) 100-5 MCG/ACT AERO Inhale 2 puffs into the lungs 2 (two) times daily. 1 Inhaler 5  . polyethylene glycol (MIRALAX / GLYCOLAX) packet Take 17 g by mouth daily.    Marland Kitchen Spacer/Aero-Holding Chambers (AEROCHAMBER MV) inhaler Use as instructed 1 each 0  . traMADol (ULTRAM) 50 MG tablet Take 50 mg by mouth every 6 (six) hours as needed.     Current Facility-Administered Medications on File Prior to Visit  Medication Dose Route Frequency Provider Last Rate Last Dose  . bupivacaine (MARCAINE) 0.5 % 15 mL, phenazopyridine (PYRIDIUM) 400 mg bladder mixture   Bladder Instillation Once Festus Aloe, MD

## 2015-09-03 NOTE — Telephone Encounter (Signed)
Agree with VS statement, the criteria for getting a handicap sticker for a lung problem are very strict.  From the surface it doesn't look like she meets that criteria.  Can wait for VS to respond when he is back.

## 2015-09-04 ENCOUNTER — Ambulatory Visit (INDEPENDENT_AMBULATORY_CARE_PROVIDER_SITE_OTHER): Payer: Medicare Other

## 2015-09-04 ENCOUNTER — Ambulatory Visit (INDEPENDENT_AMBULATORY_CARE_PROVIDER_SITE_OTHER): Payer: Medicare Other | Admitting: Family Medicine

## 2015-09-04 VITALS — BP 152/90 | HR 100 | Temp 98.8°F | Resp 18 | Ht 68.5 in | Wt 190.0 lb

## 2015-09-04 DIAGNOSIS — M25561 Pain in right knee: Secondary | ICD-10-CM

## 2015-09-04 DIAGNOSIS — D869 Sarcoidosis, unspecified: Secondary | ICD-10-CM

## 2015-09-04 DIAGNOSIS — Z23 Encounter for immunization: Secondary | ICD-10-CM | POA: Diagnosis not present

## 2015-09-04 DIAGNOSIS — M25461 Effusion, right knee: Secondary | ICD-10-CM

## 2015-09-04 DIAGNOSIS — D649 Anemia, unspecified: Secondary | ICD-10-CM | POA: Diagnosis not present

## 2015-09-04 LAB — POCT CBC
Granulocyte percent: 65.1 %G (ref 37–80)
HCT, POC: 35.2 % — AB (ref 37.7–47.9)
Hemoglobin: 10.7 g/dL — AB (ref 12.2–16.2)
Lymph, poc: 1.3 (ref 0.6–3.4)
MCH, POC: 24.2 pg — AB (ref 27–31.2)
MCHC: 30.3 g/dL — AB (ref 31.8–35.4)
MCV: 80 fL (ref 80–97)
MID (cbc): 0.3 (ref 0–0.9)
MPV: 7.2 fL (ref 0–99.8)
POC Granulocyte: 3.1 (ref 2–6.9)
POC LYMPH PERCENT: 27.7 %L (ref 10–50)
POC MID %: 7.2 % (ref 0–12)
Platelet Count, POC: 263 10*3/uL (ref 142–424)
RBC: 4.4 M/uL (ref 4.04–5.48)
RDW, POC: 14.8 %
WBC: 4.7 10*3/uL (ref 4.6–10.2)

## 2015-09-04 MED ORDER — TRAMADOL HCL 50 MG PO TABS: ORAL_TABLET | ORAL | Status: DC

## 2015-09-04 NOTE — Progress Notes (Signed)
Chief Complaint:  Chief Complaint  Patient presents with  . Knee Pain    Bialteral knee pain & swelling x 2 weeks. Thought it would get better, but worse at nights  . Flu Vaccine    Has been wheezing some at night, otherwise no problems with getting a flu shot    HPI: Rhonda Hester is a 39 y.o. female who reports to [facility] today complaining of 2 week history of constant pain in her bilateral knees, she has had it before on both sides, she has had them on the left and now on the right, she is having pain in her sleep and she has swellign and pain which wakes her up, she has sharp diffuse right  anterior and posterior knee pain, if she walks alittle bit more she states it gets worse. She can;t hang her legs fopr prolong periods or walk for prolong periods without knees swelling . She has no hx gout, RA, lupus. She has pulmonary sarcoidosis, she is wheezing some but not now, she ahs been using albuterol and dulera prn , not daily.  Deneis calf pain or swelling. She is here with handicap placard forms as well, she got a ticket for having expired tags  She has had anemia , her menses are 2-3 days, has some clots , q 30 days.   Past Medical History  Diagnosis Date  . Seasonal allergic rhinitis   . Eczema   . Interstitial cystitis   . Allergic asthma   . Sarcoidosis of lung     DX 2006--  PULMOLOGIST--  DR HBZJ  . Shortness of breath   . History of Bell's palsy     2006  RIGHT SIDE--  RESOLVED  . History of anal fissures   . History of gastroesophageal reflux (GERD)   . GERD (gastroesophageal reflux disease)    Past Surgical History  Procedure Laterality Date  . Dilation and curettage of uterus  1997  . Cystoscopy/ hydrodistention/ bladder bx  06-09-2010  . Cysto with hydrodistension N/A 08/24/2013    Procedure: CYSTOSCOPY/HYDRODISTENSION with instillation of marcaine and pyridium;  Surgeon: Fredricka Bonine, MD;  Location: [facility];  Service: Urology;   Laterality: N/A;   Social History   Social History  . Marital Status: Single    Spouse Name: N/A  . Number of Children: N/A  . Years of Education: N/A   Social History Main Topics  . Smoking status: Former Smoker -- 0.30 packs/day for 6 years    Types: Cigarettes  . Smokeless tobacco: Never Used     Comment: ONE CIG. DAILY /  1 PPMONTH  . Alcohol Use: No  . Drug Use: No     Comment: HX MARIJUANA USE  . Sexual Activity: Not Asked   Other Topics Concern  . None   Social History Narrative   Family History  Problem Relation Age of Onset  . Brain cancer Maternal Grandmother    Allergies  Allergen Reactions  . Citric Acid Other (See Comments)    AVOIDS DUE TO INTERSTITIAL CYSTITIS  . Dust Mite Extract Itching and Other (See Comments)     coughing  . Pollen Extract Itching and Other (See Comments)     coughing  . Nickel Rash   Prior to Admission medications   Medication Sig Start Date End Date Taking? Authorizing Provider  albuterol (PROAIR HFA) 108 (90 BASE) MCG/ACT inhaler Inhale 2 puffs into the lungs every 6 (six) hours as  needed for shortness of breath. 05/15/15  Yes Chesley Mires, MD  hydrocortisone valerate cream (WESTCORT) 0.2 % Apply 1 application topically 2 (two) times daily.   Yes Historical Provider, MD  hydrOXYzine (ATARAX/VISTARIL) 10 MG tablet Take 10 mg by mouth at bedtime as needed for itching.   Yes Historical Provider, MD  mometasone-formoterol (DULERA) 100-5 MCG/ACT AERO Inhale 2 puffs into the lungs 2 (two) times daily. 05/15/15  Yes Chesley Mires, MD  polyethylene glycol (MIRALAX / GLYCOLAX) packet Take 17 g by mouth daily.   Yes Historical Provider, MD  Spacer/Aero-Holding Chambers (AEROCHAMBER MV) inhaler Use as instructed 05/29/15  Yes Tammy S Parrett, NP  traMADol (ULTRAM) 50 MG tablet Take 50 mg by mouth every 6 (six) hours as needed.    Historical Provider, MD     ROS: The patient denies fevers, chills, night sweats, unintentional weight loss, chest  pain, palpitations, wheezing, dyspnea on exertion, nausea, vomiting, abdominal pain, dysuria, hematuria, melena, numbness, weakness, or tingling.   All other systems have been reviewed and were otherwise negative with the exception of those mentioned in the HPI and as above.    PHYSICAL EXAM: Filed Vitals:   09/04/15 1753  BP: 152/90  Pulse: 100  Temp: 98.8 F (37.1 C)  Resp: 18   Body mass index is 28.47 kg/(m^2).   General: Alert, no acute distress HEENT:  Normocephalic, atraumatic, oropharynx patent. EOMI, PERRLA Cardiovascular:  Regular rate and rhythm, no rubs murmurs or gallops.  No Carotid bruits, radial pulse intact. No pedal edema.  Respiratory: Clear to auscultation bilaterally.  No wheezes, rales, or rhonchi.  No cyanosis, no use of accessory musculature Abdominal: No organomegaly, abdomen is soft and non-tender, positive bowel sounds. No masses. Skin: No rashes. Neurologic: Facial musculature symmetric. Psychiatric: Patient acts appropriately throughout our interaction. Lymphatic: No cervical or submandibular lymphadenopathy Musculoskeletal: Gait antalgic, using crutches. + edema right lateral knee, + warmth, no erythema 5/5 strength, good DP  LABS: Results for orders placed or performed in visit on 09/04/15  POCT CBC  Result Value Ref Range   WBC 4.7 4.6 - 10.2 K/uL   Lymph, poc 1.3 0.6 - 3.4   POC LYMPH PERCENT 27.7 10 - 50 %L   MID (cbc) 0.3 0 - 0.9   POC MID % 7.2 0 - 12 %M   POC Granulocyte 3.1 2 - 6.9   Granulocyte percent 65.1 37 - 80 %G   RBC 4.40 4.04 - 5.48 M/uL   Hemoglobin 10.7 (A) 12.2 - 16.2 g/dL   HCT, POC 35.2 (A) 37.7 - 47.9 %   MCV 80.0 80 - 97 fL   MCH, POC 24.2 (A) 27 - 31.2 pg   MCHC 30.3 (A) 31.8 - 35.4 g/dL   RDW, POC 14.8 %   Platelet Count, POC 263 142 - 424 K/uL   MPV 7.2 0 - 99.8 fL     EKG/XRAY:   Primary read interpreted by Dr. Marin Comment at [facility]. + soft tissue swelling in medial aspect , neg for fracture or  dislocation   ASSESSMENT/PLAN: Encounter Diagnoses  Name Primary?  . Right knee pain Yes  . Knee swelling, right   . Sarcoidosis   . Anemia, unspecified anemia type   . Flu vaccine need    Labs pending Patient is here with handicap placard forms for m e to fill out, she has a tickt fromt he police sicne her placard expired Cont with crutcha nd home knee brace prn Refer to ortho Rx Tramadol  [place] controlled Substance DB pulled , no illegal activites  Gross sideeffects, risk and benefits, and alternatives of medications d/w patient. Patient is aware that all medications have potential sideeffects and we are unable to predict every sideeffect or drug-drug interaction that may occur.  Thao Le DO  09/04/2015 7:31 PM

## 2015-09-05 LAB — IRON: Iron: 28 ug/dL — ABNORMAL LOW (ref 40–190)

## 2015-09-05 LAB — FERRITIN: Ferritin: 32 ng/mL (ref 10–291)

## 2015-09-05 LAB — RHEUMATOID FACTOR: Rheumatoid fact SerPl-aCnc: <10 [IU]/mL (ref <=14)

## 2015-09-05 LAB — URIC ACID: Uric Acid, Serum: 4.6 mg/dL (ref 2.4–7.0)

## 2015-09-05 NOTE — Telephone Encounter (Signed)
Called spoke with pt. She reports she has already spoke with PCP and needs nothing further

## 2015-09-05 NOTE — Telephone Encounter (Signed)
Please inform pt that I have not assessed her functional status related to joint problems.  This is outside my area of expertise.  She would need to contact her previous PCP to see if this can be addressed, or arrange for new PCP to have re-assessment.  Based on her lung function she does not qualify for handicap parking.

## 2015-09-08 ENCOUNTER — Ambulatory Visit (INDEPENDENT_AMBULATORY_CARE_PROVIDER_SITE_OTHER): Payer: Medicare Other | Admitting: Emergency Medicine

## 2015-09-08 VITALS — BP 122/80 | HR 106 | Temp 97.9°F | Resp 16 | Ht 68.0 in | Wt 192.6 lb

## 2015-09-08 DIAGNOSIS — D869 Sarcoidosis, unspecified: Secondary | ICD-10-CM | POA: Diagnosis not present

## 2015-09-08 DIAGNOSIS — M2550 Pain in unspecified joint: Secondary | ICD-10-CM

## 2015-09-08 MED ORDER — NAPROXEN SODIUM 550 MG PO TABS: 550.0000 mg | ORAL_TABLET | Freq: Two times a day (BID) | ORAL | Status: DC

## 2015-09-08 NOTE — Progress Notes (Signed)
Subjective:  Patient ID: Rhonda Hester, female    DOB: [birth year]  Age: 39 y.o. MRN: [id number]  CC: Back Pain; Foot Pain; and Knee Pain   HPI Rhonda Hester presents  with a history of sarcoidosis. She was seen multiple times since mid 2015 for only arthralgias. Prominently in her knees and now she's come in complaining of pain in her knees right foot and ankle. She has no history of injury or overuse. She has no history of arthritis or gout. She has no fever chills. She has no antecedent infection.  History Rhonda Hester has a past medical history of Seasonal allergic rhinitis; Eczema; Interstitial cystitis; Allergic asthma; Sarcoidosis of lung; Shortness of breath; History of Bell's palsy; History of anal fissures; History of gastroesophageal reflux (GERD); and GERD (gastroesophageal reflux disease).   She has past surgical history that includes Dilation and curettage of uterus (1997); CYSTOSCOPY/ HYDRODISTENTION/ BLADDER BX (06-09-2010); and cysto with hydrodistension (N/A, 08/24/2013).   Her  family history includes Brain cancer in her maternal grandmother.  She   reports that she has quit smoking. Her smoking use included Cigarettes. She has a 1.8 pack-year smoking history. She has never used smokeless tobacco. She reports that she does not drink alcohol or use illicit drugs.  Outpatient Prescriptions Prior to Visit  Medication Sig Dispense Refill  . albuterol (PROAIR HFA) 108 (90 BASE) MCG/ACT inhaler Inhale 2 puffs into the lungs every 6 (six) hours as needed for shortness of breath. 1 Inhaler 5  . hydrocortisone valerate cream (WESTCORT) 0.2 % Apply 1 application topically 2 (two) times daily.    . mometasone-formoterol (DULERA) 100-5 MCG/ACT AERO Inhale 2 puffs into the lungs 2 (two) times daily. 1 Inhaler 5  . Spacer/Aero-Holding Chambers (AEROCHAMBER MV) inhaler Use as instructed 1 each 0  . hydrOXYzine (ATARAX/VISTARIL) 10 MG tablet Take 10 mg by mouth at bedtime as needed  for itching.    . polyethylene glycol (MIRALAX / GLYCOLAX) packet Take 17 g by mouth daily.    . traMADol (ULTRAM) 50 MG tablet 1-2 tabs po prn for pain, take with stool softener (Patient not taking: Reported on 09/08/2015) 30 tablet 0   Facility-Administered Medications Prior to Visit  Medication Dose Route Frequency Provider Last Rate Last Dose  . bupivacaine (MARCAINE) 0.5 % 15 mL, phenazopyridine (PYRIDIUM) 400 mg bladder mixture   Bladder Instillation Once Festus Aloe, MD        Social History   Social History  . Marital Status: Single    Spouse Name: N/A  . Number of Children: N/A  . Years of Education: N/A   Social History Main Topics  . Smoking status: Former Smoker -- 0.30 packs/day for 6 years    Types: Cigarettes  . Smokeless tobacco: Never Used     Comment: ONE CIG. DAILY /  1 PPMONTH  . Alcohol Use: No  . Drug Use: No     Comment: HX MARIJUANA USE  . Sexual Activity: Not Asked   Other Topics Concern  . None   Social History Narrative     Review of Systems  Constitutional: Negative for fever, chills and appetite change.  HENT: Negative for congestion, ear pain, postnasal drip, sinus pressure and sore throat.   Eyes: Negative for pain and redness.  Respiratory: Negative for cough, shortness of breath and wheezing.   Cardiovascular: Negative for leg swelling.  Gastrointestinal: Negative for nausea, vomiting, abdominal pain, diarrhea, constipation and blood in stool.  Endocrine: Negative for polyuria.  Genitourinary: Negative  for dysuria, urgency, frequency and flank pain.  Musculoskeletal: Positive for arthralgias. Negative for gait problem.  Skin: Negative for rash.  Neurological: Negative for weakness and headaches.  Psychiatric/Behavioral: Negative for confusion and decreased concentration. The patient is not nervous/anxious.     Objective:  BP 122/80 mmHg  Pulse 106  Temp(Src) 97.9 F (36.6 C) (Oral)  Resp 16  Ht 5\' 8"  (1.727 m)  Wt 192 lb 9.6  oz (87.363 kg)  BMI 29.29 kg/m2  SpO2 96%  LMP 07/29/2015 (Approximate)  Physical Exam  Constitutional: She is oriented to person, place, and time. She appears well-developed and well-nourished.  HENT:  Head: Normocephalic and atraumatic.  Eyes: Conjunctivae are normal. Pupils are equal, round, and reactive to light.  Pulmonary/Chest: Effort normal.  Musculoskeletal: She exhibits no edema.       Right knee: Tenderness found.       Left knee: Tenderness found.       Right ankle: Deformity: I believe this isSarcoid related. Tenderness.       Right foot: There is tenderness.  Neurological: She is alert and oriented to person, place, and time.  Skin: Skin is dry.  Psychiatric: She has a normal mood and affect. Her behavior is normal. Thought content normal.      Assessment & Plan:   Rhonda Hester was seen today for back pain, foot pain and knee pain.  Diagnoses and all orders for this visit:  Sarcoidosis -     Ambulatory referral to Rheumatology  Polyarthralgia -     Ambulatory referral to Rheumatology  Other orders -     naproxen sodium (ANAPROX DS) 550 MG tablet; Take 1 tablet (550 mg total) by mouth 2 (two) times daily with a meal.   I have discontinued Rhonda Hester's polyethylene glycol, hydrOXYzine, and traMADol. I am also having her start on naproxen sodium. Additionally, I am having her maintain her hydrocortisone valerate cream, mometasone-formoterol, albuterol, and AEROCHAMBER MV.  Meds ordered this encounter  Medications  . naproxen sodium (ANAPROX DS) 550 MG tablet    Sig: Take 1 tablet (550 mg total) by mouth 2 (two) times daily with a meal.    Dispense:  60 tablet    Refill:  0   Should put on a non-steroidal and referred to rheumatology  Appropriate red flag conditions were discussed with the patient as well as actions that should be taken.  Patient expressed his understanding.  Follow-up: Return if symptoms worsen or fail to improve.  Roselee Culver,  MD

## 2015-09-08 NOTE — Patient Instructions (Signed)
Sarcoidosis, Schaumann's Disease, Sarcoid of [place] Sarcoidosis appears briefly and heals naturally in 60 to 70 percent of cases, often without the patient knowing or doing anything about it. 20 to 30 percent of patients with sarcoidosis are left with some permanent lung damage. In 10 to 15 percent of the patients, sarcoidosis can become chronic (long lasting). When either the granulomas or fibrosis seriously affect the function of a vital organ (lungs, heart, nervous system, liver, or kidneys), sarcoidosis can be fatal. This occurs 5 to 10 percent of the time. No one can predict how sarcoidosis will progress in an individual patient. The symptoms the patient experiences, the caregiver's findings, and the patient's race can give some clues. Sarcoidosis was once considered a rare disease. We now know that it is a common chronic illness that appears all over the world. It is the most common of the fibrotic (scarring) lung disorders. Anyone can get sarcoidosis. It occurs in all races and in both sexes. The risk is greater if you are a young black adult, especially a black woman, or are of Scandinavian, German, Irish, or Puerto [place] origin. In sarcoidosis, small lumps (also called nodules or granulomas) develop in multiple organs of the body. These granulomas are small collections of inflamed cells. They commonly appear in the lungs. This is the most common organ affected. They also occur in the lymph nodes (your glands), skin, liver, and eyes. The granulomas vary in the amount of disease they produce from very little with no problems (symptoms) to causing severe illness. The cause of sarcoidosis is not known. It may be due to an abnormal immune reaction in the body. Most people will recover. A few people will develop long lasting conditions that may get worse. Women are affected more often than men. The majority of those affected are under forty years of age. Because we do not know the cause, we do not have ways to  prevent it. SYMPTOMS   Fever.  Loss of appetite.  Night sweats.  Joint pain.  Aching muscles Symptoms vary because the disease affects different parts of the body in different people. Most people who see their caregiver with sarcoidosis have lung problems. The first signs are usually a dry cough and shortness of breath. There may also be wheezing, chest pain, or a cough that brings up bloody mucus. In severe cases, lung function may become so poor that the person cannot perform even the simple routine tasks of daily life. Other symptoms of sarcoidosis are less common than lung symptoms. They can include:  Skin symptoms. Sarcoidosis can appear as a collection of tender, red bumps called erythema nodosum. These bumps usually occur on the face, shins, and arms. They can also occur as a scaly, purplish discoloration on the nose, cheeks, and ears. This is called lupus pernio. Less often, sarcoidosis causes cysts, pimples, or disfiguring over growths of skin. In many cases, the disfiguring over growths develop in areas of scars or tattoos.  Eye symptoms. These include redness, eye pain, and sensitivity to light.  Heart symptoms. These include irregular heartbeat and heart failure.  Other symptoms. A person may have paralyzed facial muscles, seizures, psychiatric symptoms, swollen salivary glands, or bone pain. DIAGNOSIS  Even when there are no symptoms, your caregiver can sometimes pick up signs of sarcoidosis during a routine examination, usually through a chest x-ray or when checking other complaints. The patient's age and race or ethnic group can raise an additional red flag that a sign or symptom could   be related to sarcoidosis.   Enlargement of the salivary or tear glands and cysts in bone tissue may also be caused by sarcoidosis.  You may have had a biopsy done that shows signs of sarcoidosis. A biopsy is a small tissue sample that is removed for laboratory testing. This tissue sample can  be taken from your lung, skin, lip, or another inflamed or abnormal area of the body.  You may have had an abnormal chest X-ray. Although you appear healthy, a chest X-ray ordered for other reasons may turn up abnormalities that suggest sarcoidosis.  Other tests may be needed. These tests may be done to rule out other illnesses or to determine the amount of organ damage caused by sarcoidosis. Some of the most common tests are:  Blood levels of calcium or angiotensin-converting enzyme may be high in people with sarcoidosis.  Blood tests to evaluate how well your liver is functioning.  Lung function tests to measure how well you are breathing.  A complete eye examination. TREATMENT  If sarcoidosis does not cause any problems, treatment may not be necessary. Your caregiver may decide to simply monitor your condition. As part of this monitoring process, you may have frequent office visits, follow-up chest X-rays, and tests of your lung function.If you have signs of moderate or severe lung disease, your doctor may recommend:  A corticosteroid drug, such as prednisone (sold under several brand names).  Corticosteroids also are used to treat sarcoidosis of the eyes, joints, skin, nerves, or heart.  Corticosteroid eye drops may be used for the eyes.  Over-the-counter medications like nonsteroidal anti-inflammatory drugs (NSAID) often are used to treat joint pain first before corticosteroids, which tend to have more side effects.  If corticosteroids are not effective or cause serious side effects, other drugs that alter or suppress the immune system may be used.  In rare cases, when sarcoidosis causes life-threatening lung disease, a lung transplant may be necessary. However, there is some risk that the new lungs also will be attacked by sarcoidosis. SEEK IMMEDIATE MEDICAL CARE IF:   You suffer from shortness of breath or a lingering cough.  You develop new problems that may be related to the  disease. Remember this disease can affect almost all organs of the body and cause many different problems. Document Released: 10/13/2004 Document Revised: 03/06/2012 Document Reviewed: 04/10/2014 ExitCare Patient Information 2015 [employer]. This information is not intended to replace advice given to you by your health care provider. Make sure you discuss any questions you have with your health care provider.  

## 2015-10-06 ENCOUNTER — Encounter: Payer: Self-pay | Admitting: Family Medicine

## 2015-10-23 ENCOUNTER — Ambulatory Visit: Payer: Medicare Other | Admitting: Pulmonary Disease

## 2015-10-29 ENCOUNTER — Ambulatory Visit: Payer: Medicare Other | Admitting: Pulmonary Disease

## 2016-04-21 ENCOUNTER — Ambulatory Visit (INDEPENDENT_AMBULATORY_CARE_PROVIDER_SITE_OTHER)
Admission: RE | Admit: 2016-04-21 | Discharge: 2016-04-21 | Disposition: A | Discharge status: Home / Self Care | Payer: Medicare Other | Source: Ambulatory Visit | Attending: Acute Care | Admitting: Acute Care

## 2016-04-21 ENCOUNTER — Ambulatory Visit (INDEPENDENT_AMBULATORY_CARE_PROVIDER_SITE_OTHER): Payer: Medicare Other | Admitting: Acute Care

## 2016-04-21 ENCOUNTER — Encounter: Payer: Self-pay | Admitting: Acute Care

## 2016-04-21 ENCOUNTER — Telehealth: Payer: Self-pay | Admitting: Pulmonary Disease

## 2016-04-21 VITALS — BP 122/66 | HR 94 | Ht 66.0 in | Wt 193.0 lb

## 2016-04-21 DIAGNOSIS — J45909 Unspecified asthma, uncomplicated: Secondary | ICD-10-CM

## 2016-04-21 DIAGNOSIS — D860 Sarcoidosis of lung: Secondary | ICD-10-CM

## 2016-04-21 MED ORDER — ALBUTEROL SULFATE HFA 108 (90 BASE) MCG/ACT IN AERS: 2.0000 | INHALATION_SPRAY | Freq: Four times a day (QID) | RESPIRATORY_TRACT | Status: DC | PRN

## 2016-04-21 MED ORDER — MOMETASONE FURO-FORMOTEROL FUM 100-5 MCG/ACT IN AERO: 2.0000 | INHALATION_SPRAY | Freq: Two times a day (BID) | RESPIRATORY_TRACT | Status: DC

## 2016-04-21 NOTE — Assessment & Plan Note (Signed)
Non-Compliance with maintenance and rescue medications Complains of SOB with exertion only  Plan: You need to take your [facility]  2 puffs twice daily. Rhonda Hester is your maintenance medication, and needs to be taken every day without fail. We will phone in a prescription for the [facility]. We will give you some samples of Dulera to help with cost. Continue taking your Zyrtec once daily, it is ok to get generic which is Cetirizine. We will send in a prescription for Dynegy The Dynegy is your rescue inhaler. Use this for breakthrough shortness of breath or wheezing every 6 hours as needed. Follow up in 1 month with Dr. Halford Chessman to ensure compliance. Please contact office for sooner follow up if symptoms do not improve or worsen or seek emergency care

## 2016-04-21 NOTE — Patient Instructions (Addendum)
It is nice to meet you today.  Dermatology referral for evaluation of new acne to evaluate if sarcoid related. CXR today You need to take your [facility]  2 puffs twice daily.Rinse mouth after use. Ruthe Mannan is your maintenance medication, and needs to be taken every day without fail. We will phone in a prescription for the [facility]. We will give you some samples of Dulera to help with cost. Continue taking your Zyrtec, it is ok to get generic which is Cetirizine. We will send in a prescription for Dynegy The Dynegy is your rescue inhaler. Use this for breakthrough shortness of breath or wheezing every 6 hours as needed for shortness of breath or wheezing. Follow up in 1 month with Dr. Halford Chessman. Please contact office for sooner follow up if symptoms do not improve or worsen or seek emergency care

## 2016-04-21 NOTE — Telephone Encounter (Signed)
Called, spoke with pt.  Pt states she has noticed increased DOE, wheezing with exertion, and increased cough x 1 month.  Pt states she started working out at a gym approx 1 month ago and has noticed symptoms worsening with exertion since.  She also reports acne on face and questioning if this is r/t sarcoidosis.  OV scheduled for today at 3:30 pm with SG.  Pt to discuss symptoms during OV.  Pt verbalized understanding, is in agreement with plan, and voiced no further questions or concerns at this time.

## 2016-04-21 NOTE — Progress Notes (Signed)
Subjective:    Patient ID: Rhonda Hester, female    DOB: 27-Jul-1976, 40 y.o.   MRN: [id number]  HPI 40 y.o. female former smoker with asthma, allergic rhinitis, and sarcoidosis (pulmonary, skin involvement, Bell's palsy) first dx in 2006. Last Seen by Dr. Halford Chessman  05/15/2015.      TESTS: CT chest 12/30/04 >> b/l hilar and mediastinal adenopathy with b/l nodular interstitial opacities  CT chest 07/08/08 >> decreased lymph nodes, scarring with volume loss  RAST 07/22/11 >> Multiple allergens, IgE 71.8  PFT 09/02/11 >> FEV1 2.80(93%), FEV1% 82, TLC 4.74(89%), DLCO 59%, DLCO/VA 95%, no BD response.  PFT 09/25/13 >> FEV1 2.75 (95%), FEV1% 81, TLC 4.25 (77%), DLCO 64%, no BD response. CT chest 11/20/13 >> Scattered areas of ATX CT chest 04/02/14 >> calcified Rt hilar LAN, scattered areas of scarring   04/21/2016  Acute OV: Pt. Presents to the office today with worsening shortness of breath with exertion/ climbing stairs for about 3 months.She has been working out at [employer].No chest pain or tightness. She has not been compliant using either the [facility] or the Dynegy inhaler.She states she  does not have Dulera or ProAir inhaler. She states she does not have money for medicine.She does have both [employer]  Medicare and Medicaid. We discussed that she needs to take her medication as prescribed. We spent significant time discussing the difference between maintenance and rescue medications. She also is complaining of acne, which she is concerned may be sarcoid related.We will refer to dermatology. We will also check a CXR today. She denies fever, sinus congestion, sore throat, hemoptysis, abdominal pain, or leg swelling.    Current outpatient prescriptions:  .  albuterol (PROAIR HFA) 108 (90 BASE) MCG/ACT inhaler, Inhale 2 puffs into the lungs every 6 (six) hours as needed for shortness of breath., Disp: 1 Inhaler, Rfl: 5 .  hydrocortisone valerate cream (WESTCORT) 0.2 %, Apply 1 application  topically 2 (two) times daily., Disp: , Rfl:  .  mometasone-formoterol (DULERA) 100-5 MCG/ACT AERO, Inhale 2 puffs into the lungs 2 (two) times daily., Disp: 1 Inhaler, Rfl: 5 .  naproxen sodium (ANAPROX DS) 550 MG tablet, Take 1 tablet (550 mg total) by mouth 2 (two) times daily with a meal., Disp: 60 tablet, Rfl: 0 .  Spacer/Aero-Holding Chambers (AEROCHAMBER MV) inhaler, Use as instructed, Disp: 1 each, Rfl: 0 .  albuterol (PROAIR HFA) 108 (90 Base) MCG/ACT inhaler, Inhale 2 puffs into the lungs every 6 (six) hours as needed for wheezing or shortness of breath., Disp: 8.8 g, Rfl: 3 .  mometasone-formoterol (DULERA) 100-5 MCG/ACT AERO, Inhale 2 puffs into the lungs 2 (two) times daily., Disp: 1 Inhaler, Rfl: 0 .  mometasone-formoterol (DULERA) 100-5 MCG/ACT AERO, Inhale 2 puffs into the lungs 2 (two) times daily., Disp: 1 Inhaler, Rfl: 3 No current facility-administered medications for this visit.  Facility-Administered Medications Ordered in Other Visits:  .  bupivacaine (MARCAINE) 0.5 % 15 mL, phenazopyridine (PYRIDIUM) 400 mg bladder mixture, , Bladder Instillation, Once, Festus Aloe, MD   Past Medical History  Diagnosis Date  . Seasonal allergic rhinitis   . Eczema   . Interstitial cystitis   . Allergic asthma   . Sarcoidosis of lung ([place])     DX 2006--  PULMOLOGIST--  DR [id number]  . Shortness of breath   . History of Bell's palsy     2006  RIGHT SIDE--  RESOLVED  . History of anal fissures   . History of gastroesophageal reflux (  GERD)   . GERD (gastroesophageal reflux disease)     Allergies  Allergen Reactions  . Citric Acid Other (See Comments)    AVOIDS DUE TO INTERSTITIAL CYSTITIS  . Dust Mite Extract Itching and Other (See Comments)     coughing  . Pollen Extract Itching and Other (See Comments)     coughing  . Nickel Rash     Review of Systems Constitutional:   No  weight loss, night sweats,  Fevers, chills, fatigue, or  lassitude.  HEENT:   No headaches,   Difficulty swallowing,  Tooth/dental problems, or  Sore throat,                No sneezing, itching, ear ache, nasal congestion, post nasal drip,   CV:  No chest pain,  Orthopnea, PND, swelling in lower extremities, anasarca, dizziness, palpitations, syncope.   GI  No heartburn, indigestion, abdominal pain, nausea, vomiting, diarrhea, change in bowel habits, loss of appetite, bloody stools.   Resp: + shortness of breath with exertion not at rest.  + excess mucus, no productive cough,  No non-productive cough,  No coughing up of blood.  No change in color of mucus.  No wheezing.  No chest wall deformity  Skin: no rash or lesions.  GU: no dysuria, change in color of urine, no urgency or frequency.  No flank pain, no hematuria   MS:  No joint pain or swelling.  No decreased range of motion.  No back pain.  Psych:  No change in mood or affect. No depression or anxiety.  No memory loss.        Objective:   Physical Exam BP 122/66 mmHg  Pulse 94  Ht 5\' 6"  (1.676 m)  Wt 193 lb (87.544 kg)  BMI 31.17 kg/m2  SpO2 99%  LMP 04/07/2016  Physical Exam:  General- No distress,  A&Ox3 ENT: No sinus tenderness, TM clear, pale nasal mucosa, no oral exudate,no post nasal drip, no LAN Cardiac: S1, S2, regular rate and rhythm, no murmur Chest: No wheeze/ rales/ +coarse breath sounds ; no accessory muscle use, no nasal flaring, no sternal retractions Abd.: Soft Non-tender Ext: No clubbing cyanosis, edema Neuro:  normal strength Skin: No rashes, warm and dry Psych: normal mood and behavior  Magdalen Spatz, AGACNP-BC [facility] 04/21/2016    Assessment & Plan:

## 2016-04-21 NOTE — Assessment & Plan Note (Addendum)
?   Flare/ non-compliance  Worsening acne ? Sarcoid related. Plan: CXR today Dermatology referral for evaluation of worsening acne/ sarcoid. Dulera 2 puffs twice daily, rinse mouth after each use Follow up with Dr. Halford Chessman in 1 month for compliance Please contact office for sooner follow up if symptoms do not improve or worsen or seek emergency care

## 2016-04-22 NOTE — Progress Notes (Signed)
Dg Chest 2 View  04/22/2016  CLINICAL DATA:  Cough, intermittent shortness of breath for 3 months, history of sarcoidosis EXAM: CHEST  2 VIEW COMPARISON:  05/15/2015 FINDINGS: The heart size and vascular pattern are normal. There is mild diffuse interstitial change particularly in the right upper lobe. There is an oval 3 x 1 cm perihilar opacity on the left. This is stable. There are no new opacities. There are no pleural effusions. IMPRESSION: Stable bilateral parenchymal fibrosis consistent with sarcoidosis. No acute findings. Electronically Signed   By: Skipper Cliche M.D.   On: 04/22/2016 08:18    CXR shows chronic changes from sarcoidosis.  Will re-assess her status at [facility] and then determine if she needs repeat PFT and CT chest.  Chesley Mires, MD [place] 04/22/2016, 10:40 AM Pager:  [phone]

## 2016-05-03 ENCOUNTER — Telehealth: Payer: Self-pay | Admitting: *Deleted

## 2016-05-03 NOTE — Telephone Encounter (Signed)
Initiated PA for [employer] CMM. Key: [id number]  [facility] (p) [phone]  ([id number]  Submitted for review.

## 2016-05-03 NOTE — Telephone Encounter (Signed)
Ruthe Mannan has been approved until 12/26/2016. Pharmacy informed.  [id number].

## 2016-05-10 ENCOUNTER — Other Ambulatory Visit: Payer: Self-pay | Admitting: Family Medicine

## 2016-05-10 ENCOUNTER — Ambulatory Visit (INDEPENDENT_AMBULATORY_CARE_PROVIDER_SITE_OTHER): Payer: Medicare Other | Admitting: Family Medicine

## 2016-05-10 ENCOUNTER — Encounter: Payer: Self-pay | Admitting: Family Medicine

## 2016-05-10 VITALS — BP 132/74 | HR 81 | Temp 98.2°F | Resp 16 | Ht 67.0 in | Wt 200.0 lb

## 2016-05-10 DIAGNOSIS — Z6831 Body mass index (BMI) 31.0-31.9, adult: Secondary | ICD-10-CM | POA: Diagnosis not present

## 2016-05-10 DIAGNOSIS — R635 Abnormal weight gain: Secondary | ICD-10-CM

## 2016-05-10 DIAGNOSIS — Z23 Encounter for immunization: Secondary | ICD-10-CM

## 2016-05-10 DIAGNOSIS — R52 Pain, unspecified: Secondary | ICD-10-CM | POA: Diagnosis not present

## 2016-05-10 DIAGNOSIS — N3010 Interstitial cystitis (chronic) without hematuria: Secondary | ICD-10-CM

## 2016-05-10 DIAGNOSIS — L700 Acne vulgaris: Secondary | ICD-10-CM | POA: Diagnosis not present

## 2016-05-10 DIAGNOSIS — R82998 Other abnormal findings in urine: Secondary | ICD-10-CM

## 2016-05-10 DIAGNOSIS — N390 Urinary tract infection, site not specified: Secondary | ICD-10-CM

## 2016-05-10 LAB — COMPLETE METABOLIC PANEL WITH GFR
ALBUMIN: 3.7 g/dL (ref 3.6–5.1)
ALK PHOS: 153 U/L — AB (ref 33–115)
ALT: 46 U/L — ABNORMAL HIGH (ref 6–29)
AST: 38 U/L — ABNORMAL HIGH (ref 10–30)
BUN: 7 mg/dL (ref 7–25)
CALCIUM: 9.5 mg/dL (ref 8.6–10.2)
CO2: 23 mmol/L (ref 20–31)
Chloride: 103 mmol/L (ref 98–110)
Creat: 0.62 mg/dL (ref 0.50–1.10)
Glucose, Bld: 76 mg/dL (ref 65–99)
POTASSIUM: 3.5 mmol/L (ref 3.5–5.3)
Sodium: 136 mmol/L (ref 135–146)
Total Bilirubin: 0.7 mg/dL (ref 0.2–1.2)
Total Protein: 7.6 g/dL (ref 6.1–8.1)

## 2016-05-10 LAB — POCT URINALYSIS DIP (DEVICE)
BILIRUBIN URINE: NEGATIVE
GLUCOSE, UA: NEGATIVE mg/dL
Hgb urine dipstick: NEGATIVE
Ketones, ur: NEGATIVE mg/dL
NITRITE: NEGATIVE
Protein, ur: NEGATIVE mg/dL
Specific Gravity, Urine: 1.015 (ref 1.005–1.030)
UROBILINOGEN UA: 0.2 mg/dL (ref 0.0–1.0)
pH: 7.5 (ref 5.0–8.0)

## 2016-05-10 LAB — CBC WITH DIFFERENTIAL/PLATELET
BASOS PCT: 1 %
Basophils Absolute: 48 cells/uL (ref 0–200)
EOS PCT: 6 %
Eosinophils Absolute: 288 cells/uL (ref 15–500)
HCT: 33.8 % — ABNORMAL LOW (ref 35.0–45.0)
Hemoglobin: 10.9 g/dL — ABNORMAL LOW (ref 11.7–15.5)
LYMPHS PCT: 32 %
Lymphs Abs: 1536 cells/uL (ref 850–3900)
MCH: 25.4 pg — ABNORMAL LOW (ref 27.0–33.0)
MCHC: 32.2 g/dL (ref 32.0–36.0)
MCV: 78.8 fL — ABNORMAL LOW (ref 80.0–100.0)
MONO ABS: 528 {cells}/uL (ref 200–950)
MONOS PCT: 11 %
MPV: 9.6 fL (ref 7.5–12.5)
Neutro Abs: 2400 cells/uL (ref 1500–7800)
Neutrophils Relative %: 50 %
PLATELETS: 287 10*3/uL (ref 140–400)
RBC: 4.29 MIL/uL (ref 3.80–5.10)
RDW: 15.8 % — AB (ref 11.0–15.0)
WBC: 4.8 10*3/uL (ref 3.8–10.8)

## 2016-05-10 LAB — TSH: TSH: 1.14 mIU/L

## 2016-05-10 NOTE — Patient Instructions (Addendum)
Recommend a lowfat, low carbohydrate diet divided over 5-6 small meals, increase water intake to 6-8 glasses, and 150 minutes per week of cardiovascular exercise.   Fat and Cholesterol Restricted Diet High levels of fat and cholesterol in your blood may lead to various health problems, such as diseases of the heart, blood vessels, gallbladder, liver, and pancreas. Fats are concentrated sources of energy that come in various forms. Certain types of fat, including saturated fat, may be harmful in excess. Cholesterol is a substance needed by your body in small amounts. Your body makes all the cholesterol it needs. Excess cholesterol comes from the food you eat. When you have high levels of cholesterol and saturated fat in your blood, health problems can develop because the excess fat and cholesterol will gather along the walls of your blood vessels, causing them to narrow. Choosing the right foods will help you control your intake of fat and cholesterol. This will help keep the levels of these substances in your blood within normal limits and reduce your risk of disease. WHAT IS MY PLAN? Your health care provider recommends that you:  Get no more than __________ % of the total calories in your daily diet from fat.  Limit your intake of saturated fat to less than ______% of your total calories each day.  Limit the amount of cholesterol in your diet to less than _________mg per day. WHAT TYPES OF FAT SHOULD I CHOOSE?  Choose healthy fats more often. Choose monounsaturated and polyunsaturated fats, such as olive and canola oil, flaxseeds, walnuts, almonds, and seeds.  Eat more omega-3 fats. Good choices include salmon, mackerel, sardines, tuna, flaxseed oil, and ground flaxseeds. Aim to eat fish at least two times a week.  Limit saturated fats. Saturated fats are primarily found in animal products, such as meats, butter, and cream. Plant sources of saturated fats include palm oil, palm kernel oil, and  coconut oil.  Avoid foods with partially hydrogenated oils in them. These contain trans fats. Examples of foods that contain trans fats are stick margarine, some tub margarines, cookies, crackers, and other baked goods. WHAT GENERAL GUIDELINES DO I NEED TO FOLLOW? These guidelines for healthy eating will help you control your intake of fat and cholesterol:  Check food labels carefully to identify foods with trans fats or high amounts of saturated fat.  Fill one half of your plate with vegetables and green salads.  Fill one fourth of your plate with whole grains. Look for the word "whole" as the first word in the ingredient list.  Fill one fourth of your plate with lean protein foods.  Limit fruit to two servings a day. Choose fruit instead of juice.  Eat more foods that contain soluble fiber. Examples of foods that contain this type of fiber are apples, broccoli, carrots, beans, peas, and barley. Aim to get 20-30 g of fiber per day.  Eat more home-cooked food and less restaurant, buffet, and fast food.  Limit or avoid alcohol.  Limit foods high in starch and sugar.  Limit fried foods.  Cook foods using methods other than frying. Baking, boiling, grilling, and broiling are all great options.  Lose weight if you are overweight. Losing just 5-10% of your initial body weight can help your overall health and prevent diseases such as diabetes and heart disease. WHAT FOODS CAN I EAT? Grains Whole grains, such as whole wheat or whole grain breads, crackers, cereals, and pasta. Unsweetened oatmeal, bulgur, barley, quinoa, or brown rice. Corn or whole  wheat flour tortillas. Vegetables Fresh or frozen vegetables (raw, steamed, roasted, or grilled). Green salads. Fruits All fresh, canned (in natural juice), or frozen fruits. Meat and Other Protein Products Ground beef (85% or leaner), grass-fed beef, or beef trimmed of fat. Skinless chicken or [place]. Ground chicken or [place]. Pork trimmed  of fat. All fish and seafood. Eggs. Dried beans, peas, or lentils. Unsalted nuts or seeds. Unsalted canned or dry beans. Dairy Low-fat dairy products, such as skim or 1% milk, 2% or reduced-fat cheeses, low-fat ricotta or cottage cheese, or plain low-fat yogurt. Fats and Oils Tub margarines without trans fats. Light or reduced-fat mayonnaise and salad dressings. Avocado. Olive, canola, sesame, or safflower oils. Natural peanut or almond butter (choose ones without added sugar and oil). The items listed above may not be a complete list of recommended foods or beverages. Contact your dietitian for more options. WHAT FOODS ARE NOT RECOMMENDED? Grains White bread. White pasta. White rice. Cornbread. Bagels, pastries, and croissants. Crackers that contain trans fat. Vegetables White potatoes. Corn. Creamed or fried vegetables. Vegetables in a cheese sauce. Fruits Dried fruits. Canned fruit in light or heavy syrup. Fruit juice. Meat and Other Protein Products Fatty cuts of meat. Ribs, chicken wings, bacon, sausage, bologna, salami, chitterlings, fatback, hot dogs, bratwurst, and packaged luncheon meats. Liver and organ meats. Dairy Whole or 2% milk, cream, half-and-half, and cream cheese. Whole milk cheeses. Whole-fat or sweetened yogurt. Full-fat cheeses. Nondairy creamers and whipped toppings. Processed cheese, cheese spreads, or cheese curds. Sweets and Desserts Corn syrup, sugars, honey, and molasses. Candy. Jam and jelly. Syrup. Sweetened cereals. Cookies, pies, cakes, donuts, muffins, and ice cream. Fats and Oils Butter, stick margarine, lard, shortening, ghee, or bacon fat. Coconut, palm kernel, or palm oils. Beverages Alcohol. Sweetened drinks (such as sodas, lemonade, and fruit drinks or punches). The items listed above may not be a complete list of foods and beverages to avoid. Contact your dietitian for more information.   This information is not intended to replace advice given to you  by your health care provider. Make sure you discuss any questions you have with your health care provider.   Document Released: 12/13/2005 Document Revised: 01/03/2015 Document Reviewed: 03/13/2014 Elsevier Interactive Patient Education [employer].

## 2016-05-10 NOTE — Progress Notes (Signed)
Subjective:    Patient ID: Rhonda Hester, female    DOB: 1975-12-29, 40 y.o.   MRN: [id number]  HPI Rhonda Hester, a 40 year old female with a history of sarcodoisis and interstitial cystitis presents to establish care. She maintains that she was a patient of Sharlotte Alamo and has been lost to follow up. Rhonda Hester is followed by Dr. Chesley Mires, [facility] for sarcoidosis. Symptoms are controlled on Dulera daily. She is also followed by [facility] for interstitial cystitis, she has a follow up scheduled for 05/12/2016. She says that she continues to have bladder spasms. Current pain intensity is 2/10. She completed antibiotic therapy per urology 2 weeks ago. She reports occasional dysuria. She denies fatigue, fever, chest pains, urinary frequency, urgency, or constipation.   Patient is also complaining of acne for greater than 4 months. She says that acne has worsened over the past several months. She has tried Clearisil and OTC acne medications. Symptoms have gradually worsened. Lesions are described as open comedones and scars.   Past Medical History  Diagnosis Date  . Seasonal allergic rhinitis   . Eczema   . Interstitial cystitis   . Allergic asthma   . Sarcoidosis of lung ([place])     DX 2006--  PULMOLOGIST--  DR [id number]  . Shortness of breath   . History of Bell's palsy     2006  RIGHT SIDE--  RESOLVED  . History of anal fissures   . History of gastroesophageal reflux (GERD)   . GERD (gastroesophageal reflux disease)    Immunization History  Administered Date(s) Administered  . Influenza Whole 09/02/2011, 09/26/2012  . Influenza,inj,Quad PF,36+ Mos 12/27/2012, 09/04/2015   Social History   Social History  . Marital Status: Single    Spouse Name: N/A  . Number of Children: N/A  . Years of Education: N/A   Occupational History  . Not on file.   Social History Main Topics  . Smoking status: Former Smoker -- 0.30 packs/day for 6 years    Types: Cigarettes   . Smokeless tobacco: Never Used     Comment: ONE CIG. DAILY /  1 PPMONTH  . Alcohol Use: No  . Drug Use: No     Comment: HX MARIJUANA USE  . Sexual Activity: Not on file   Other Topics Concern  . Not on file   Social History Narrative   Review of Systems  Constitutional: Positive for unexpected weight change (weight gain). Negative for fatigue.  HENT: Negative.   Eyes: Negative.   Respiratory: Negative.  Negative for shortness of breath.   Cardiovascular: Negative.  Negative for chest pain, palpitations and leg swelling.  Gastrointestinal: Negative.   Endocrine: Negative.  Negative for polydipsia, polyphagia and polyuria.  Genitourinary: Negative.   Musculoskeletal: Negative.   Skin: Positive for rash (facial acne).  Allergic/Immunologic: Negative.   Neurological: Negative.   Hematological: Negative.   Psychiatric/Behavioral: Negative.  Negative for suicidal ideas and sleep disturbance.       Objective:   Physical Exam  Constitutional: She is oriented to person, place, and time. She appears well-developed and well-nourished.  HENT:  Head: Normocephalic and atraumatic.  Right Ear: External ear normal.  Left Ear: External ear normal.  Mouth/Throat: Oropharynx is clear and moist.  Eyes: Conjunctivae and EOM are normal. Pupils are equal, round, and reactive to light.  Neck: Normal range of motion. Neck supple.  Pulmonary/Chest: Effort normal and breath sounds normal.  Abdominal: Soft. Bowel sounds are normal.  Musculoskeletal:  Normal range of motion.  Neurological: She is alert and oriented to person, place, and time. She has normal reflexes.  Skin: Skin is warm and dry.  Varying stages: open comedones, hyperpigmentation to face, appears dry.  Psychiatric: She has a normal mood and affect. Her behavior is normal. Judgment and thought content normal.      BP 132/74 mmHg  Pulse 81  Temp(Src) 98.2 F (36.8 C) (Oral)  Resp 16  Ht 5\' 7"  (1.702 m)  Wt 200 lb (90.719 kg)   BMI 31.32 kg/m2  SpO2 98%  LMP 04/07/2016 Assessment & Plan:   1. Acne vulgaris  - Ambulatory referral to Dermatology  2. BMI 31.0-31.9,adult Recommend a lowfat, low carbohydrate diet divided over 5-6 small meals, increase water intake to 6-8 glasses, and 150 minutes per week of cardiovascular exercise.    3. Weight gain - TSH - COMPLETE METABOLIC PANEL WITH GFR - Lipid Panel; Future  4. Pain due to interstitial cystitis Will fax labs to [facility] as they come available.  - CBC with Differential  5. Need for Tdap vaccination  - Tdap vaccine greater than or equal to 7yo IM  6. Immunization due  - Pneumococcal polysaccharide vaccine 23-valent greater than or equal to 2yo subcutaneous/IM  7. Urine leukocytes - Urine culture  Routine Maintenance Exam:   Last pap smear: Next month  Recommend monthly self-breast exam.  Patient will need mammogram in December.  Patient is up to date with vaccinations    RTC: Patient will follow up in office in 1 month for pap smear.     Dorena Dew, FNP

## 2016-05-11 LAB — HEPATITIS PANEL, ACUTE
HCV Ab: NEGATIVE
HEP B S AG: NEGATIVE
Hep A IgM: NONREACTIVE
Hep B C IgM: NONREACTIVE

## 2016-05-12 ENCOUNTER — Other Ambulatory Visit: Payer: Self-pay | Admitting: Family Medicine

## 2016-05-12 DIAGNOSIS — R1084 Generalized abdominal pain: Secondary | ICD-10-CM

## 2016-05-12 DIAGNOSIS — R748 Abnormal levels of other serum enzymes: Secondary | ICD-10-CM

## 2016-05-12 LAB — URINE CULTURE
COLONY COUNT: NO GROWTH
ORGANISM ID, BACTERIA: NO GROWTH

## 2016-05-12 NOTE — Progress Notes (Signed)
Called and spoke with patient, advised of  Elevated liver enzymes and need to do a Abdominal ultrasound. It is scheduled for 05/14/2016 @9 :00, patient was made aware. Thanks!

## 2016-05-14 ENCOUNTER — Ambulatory Visit (HOSPITAL_COMMUNITY): Admission: RE | Admit: 2016-05-14 | Payer: Medicare Other | Source: Ambulatory Visit

## 2016-05-18 ENCOUNTER — Ambulatory Visit (HOSPITAL_COMMUNITY)
Admission: RE | Admit: 2016-05-18 | Discharge: 2016-05-18 | Disposition: A | Discharge status: Home / Self Care | Payer: Medicare Other | Source: Ambulatory Visit | Attending: Family Medicine | Admitting: Family Medicine

## 2016-05-18 DIAGNOSIS — R161 Splenomegaly, not elsewhere classified: Secondary | ICD-10-CM | POA: Insufficient documentation

## 2016-05-18 DIAGNOSIS — R1084 Generalized abdominal pain: Secondary | ICD-10-CM | POA: Insufficient documentation

## 2016-05-18 DIAGNOSIS — N1330 Unspecified hydronephrosis: Secondary | ICD-10-CM | POA: Diagnosis not present

## 2016-05-18 DIAGNOSIS — R748 Abnormal levels of other serum enzymes: Secondary | ICD-10-CM | POA: Insufficient documentation

## 2016-06-01 ENCOUNTER — Encounter: Payer: Self-pay | Admitting: Pulmonary Disease

## 2016-06-01 ENCOUNTER — Ambulatory Visit (INDEPENDENT_AMBULATORY_CARE_PROVIDER_SITE_OTHER): Payer: Medicare Other | Admitting: Pulmonary Disease

## 2016-06-01 VITALS — BP 136/84 | HR 77 | Ht 66.0 in | Wt 193.4 lb

## 2016-06-01 DIAGNOSIS — D860 Sarcoidosis of lung: Secondary | ICD-10-CM | POA: Diagnosis not present

## 2016-06-01 DIAGNOSIS — J45909 Unspecified asthma, uncomplicated: Secondary | ICD-10-CM | POA: Diagnosis not present

## 2016-06-01 NOTE — Patient Instructions (Addendum)
It is good to see you again Continue your Dulera twice daily Rhonda Hester is your maintenance medication, and needs to be taken every day without fail. We have phoned in a prescription. We will give you some samples of Dulera to help with cost. Continue taking your Zyrtec once daily, it is ok to get generic which is Cetirizine. We will send in a prescription for Dynegy The Dynegy is your rescue inhaler. Use this for breakthrough shortness of breath or wheezing every 6 hours as needed. Talk to your primary doctor about enlarged spleen seen on ultrasound Follow up in 6 month with Dr. Halford Chessman to ensure continued compliance. Please contact office for sooner follow up if symptoms do not improve or worsen or seek emergency care

## 2016-06-01 NOTE — Assessment & Plan Note (Signed)
Continued non-compliance with maintenance medication, but better more compliant than previous visit. Plan: Continue your Dulera twice daily Ruthe Mannan is your maintenance medication, and needs to be taken every day without fail. We have phoned in a prescription. We will give you some samples of Dulera to help with cost. Continue taking your Zyrtec once daily, it is ok to get generic which is Cetirizine. We will send in a prescription for Dynegy The Dynegy is your rescue inhaler. Use this for breakthrough shortness of breath or wheezing every 6 hours as needed. Follow up in 6 month with Dr. Halford Chessman to ensure continued compliance. Please contact office for sooner follow up if symptoms do not improve or worsen or seek emergency care

## 2016-06-01 NOTE — Assessment & Plan Note (Signed)
Follow up with dermatologist appointment in July for acne she is concerned may be related to sarcoid.

## 2016-06-01 NOTE — Progress Notes (Signed)
History of Present Illness Rhonda Hester is a 40 y.o. female with asthma, allergic rhinitis, and sarcoidosis (pulmonary, skin involvement, Bell's palsy) first dx in 2006. Last Seen by Dr. Halford Chessman 05/15/2015.    6/6/20171 month follow up: Pt. Presents today feeling better. She asthma and dyspnea is better as she has re-started using her Maintenance Dulera on a more regular basis. She states she has dyspnea only when climbing stairs while  carrying something heavy. She rarely needs her rescue inhaler.[facility] is [facility] by [facility] for new bladder / abdominal pain. Ultrasound in May indicated splenomegaly.She has an appointment with a dermatologist in July for follow up of acne that she is concerned may be sarcoid related.  Tests  CT chest 12/30/04 >> b/l hilar and mediastinal adenopathy with b/l nodular interstitial opacities  CT chest 07/08/08 >> decreased lymph nodes, scarring with volume loss  RAST 07/22/11 >> Multiple allergens, IgE 71.8  PFT 09/02/11 >> FEV1 2.80(93%), FEV1% 82, TLC 4.74(89%), DLCO 59%, DLCO/VA 95%, no BD response.  PFT 09/25/13 >> FEV1 2.75 (95%), FEV1% 81, TLC 4.25 (77%), DLCO 64%, no BD response. CT chest 11/20/13 >> Scattered areas of ATX CT chest 04/02/14 >> calcified Rt hilar LAN, scattered areas of scarring  05/18/2016: Abdominal Ultra Sound IMPRESSION: 1. Splenomegaly with an accessory spleen. The spleen was noted to have masslike parenchymal abnormalities on the previous CT scan that are not clearly evident on today's ultrasound. 2. Mild left-sided hydronephrosis of uncertain etiology.  Past medical hx Past Medical History  Diagnosis Date  . Seasonal allergic rhinitis   . Eczema   . Interstitial cystitis   . Allergic asthma   . Sarcoidosis of lung ([place])     DX 2006--  PULMOLOGIST--  DR [id number]  . Shortness of breath   . History of Bell's palsy     2006  RIGHT SIDE--  RESOLVED  . History of anal fissures   . History of gastroesophageal  reflux (GERD)   . GERD (gastroesophageal reflux disease)      Past surgical hx, Family hx, Social hx all reviewed.  Current Outpatient Prescriptions on File Prior to Visit  Medication Sig  . albuterol (PROAIR HFA) 108 (90 BASE) MCG/ACT inhaler Inhale 2 puffs into the lungs every 6 (six) hours as needed for shortness of breath.  . hydrocortisone valerate cream (WESTCORT) 0.2 % Apply 1 application topically 2 (two) times daily. Reported on 05/10/2016  . mometasone-formoterol (DULERA) 100-5 MCG/ACT AERO Inhale 2 puffs into the lungs 2 (two) times daily.  . naproxen sodium (ANAPROX DS) 550 MG tablet Take 1 tablet (550 mg total) by mouth 2 (two) times daily with a meal.  . Spacer/Aero-Holding Chambers (AEROCHAMBER MV) inhaler Use as instructed   Current Facility-Administered Medications on File Prior to Visit  Medication  . bupivacaine (MARCAINE) 0.5 % 15 mL, phenazopyridine (PYRIDIUM) 400 mg bladder mixture     Allergies  Allergen Reactions  . Citric Acid Other (See Comments)    AVOIDS DUE TO INTERSTITIAL CYSTITIS  . Dust Mite Extract Itching and Other (See Comments)     coughing  . Pollen Extract Itching and Other (See Comments)     coughing  . Nickel Rash    Review Of Systems:  Constitutional:   No  weight loss, night sweats,  Fevers, chills, +fatigue, or  lassitude.  HEENT:   No headaches,  Difficulty swallowing,  Tooth/dental problems, or  Sore throat,  No sneezing, itching, ear ache, nasal congestion, post nasal drip,   CV:  No chest pain,  Orthopnea, PND, swelling in lower extremities, anasarca, dizziness, palpitations, syncope.   GI  No heartburn, indigestion, +abdominal pain, nausea, vomiting, diarrhea, change in bowel habits, loss of appetite, bloody stools.   Resp: + shortness of breath with exertion not  at rest.  No excess mucus, + productive cough,  No non-productive cough,  No coughing up of blood.  +change in color of mucus.  No wheezing.  No chest  wall deformity  Skin: no rash or lesions.  GU: no dysuria, change in color of urine, no urgency or frequency.  No flank pain, no hematuria   MS:  No joint pain or swelling.  No decreased range of motion.  No back pain.  Psych:  No change in mood or affect. No depression or anxiety.  No memory loss.   Vital Signs BP 136/84 mmHg  Pulse 77  Ht 5\' 6"  (1.676 m)  Wt 193 lb 6.4 oz (87.726 kg)  BMI 31.23 kg/m2  SpO2 97%   Physical Exam:  General- No distress,  A&Ox3, pleasant ENT: No sinus tenderness, TM clear, pale nasal mucosa, no oral exudate,no post nasal drip, no LAN Cardiac: S1, S2, regular rate and rhythm, no murmur Chest: No wheeze/ rales/ dullness; no accessory muscle use, no nasal flaring, no sternal retractions Abd.: Soft, Non-tender, lower abdominal pain. Ext: No clubbing cyanosis, edema Neuro:  normal strength Skin: No rashes, warm and dry Psych: normal mood and behavior   Assessment/Plan  Allergic asthma Continued non-compliance with maintenance medication, but better more compliant than previous visit. Plan: Continue your Dulera twice daily Ruthe Mannan is your maintenance medication, and needs to be taken every day without fail. We have phoned in a prescription. We will give you some samples of Dulera to help with cost. Continue taking your Zyrtec once daily, it is ok to get generic which is Cetirizine. We will send in a prescription for Dynegy The Dynegy is your rescue inhaler. Use this for breakthrough shortness of breath or wheezing every 6 hours as needed. Follow up in 6 month with Dr. Halford Chessman to ensure continued compliance. Please contact office for sooner follow up if symptoms do not improve or worsen or seek emergency care   Sarcoidosis of lung Follow up with dermatologist appointment in July for acne she is concerned may be related to sarcoid.     Magdalen Spatz, NP 06/01/2016  4:51 PM  Breathing better.  Not on prednisone any more.    She gets  intermittent sharp pain in Lt side.  She has abd u/s >> showed splenomegaly.  No sinus tenderness, HR regular, no wheeze.  Assessment/plan:  Asthma. - continue dulera  Pulmonary sarcoidosis. - stable >> monitor off prednisone  Acne with possible skin involvement with sarcoid. - she has dermatology appt in July  Splenomegaly on abdominal u/s from May 2017. - advised her to d/w her PCP  Chesley Mires, MD [place] 06/01/2016, 5:06 PM Pager:  [phone] After 3pm call: [phone]

## 2016-06-09 ENCOUNTER — Encounter (HOSPITAL_COMMUNITY): Payer: Self-pay | Admitting: Emergency Medicine

## 2016-06-09 ENCOUNTER — Ambulatory Visit (HOSPITAL_COMMUNITY)
Admission: EM | Admit: 2016-06-09 | Discharge: 2016-06-09 | Disposition: A | Discharge status: Home / Self Care | Payer: Medicare Other | Attending: Emergency Medicine | Admitting: Emergency Medicine

## 2016-06-09 DIAGNOSIS — B029 Zoster without complications: Secondary | ICD-10-CM | POA: Diagnosis not present

## 2016-06-09 MED ORDER — GABAPENTIN 100 MG PO CAPS: ORAL_CAPSULE | ORAL | Status: DC

## 2016-06-09 MED ORDER — IBUPROFEN 600 MG PO TABS: 600.0000 mg | ORAL_TABLET | Freq: Four times a day (QID) | ORAL | Status: DC | PRN

## 2016-06-09 MED ORDER — HYDROCODONE-ACETAMINOPHEN 5-325 MG PO TABS: 1.0000 | ORAL_TABLET | Freq: Four times a day (QID) | ORAL | Status: DC | PRN

## 2016-06-09 MED ORDER — VALACYCLOVIR HCL 1 G PO TABS: 1000.0000 mg | ORAL_TABLET | Freq: Three times a day (TID) | ORAL | Status: DC

## 2016-06-09 NOTE — Discharge Instructions (Signed)
Norco/Vicodin (hydrocodone-acetaminophen) is a narcotic pain medication, do not combine these medications with others containing tylenol. While taking, do not drink alcohol, drive, or perform any other activities that requires focus while taking these medications.    Shingles Shingles is an infection that causes a painful skin rash and fluid-filled blisters. Shingles is caused by the same virus that causes chickenpox. Shingles only develops in people who:  Have had chickenpox.  Have gotten the chickenpox vaccine. (This is rare.) The first symptoms of shingles may be itching, tingling, or pain in an area on your skin. A rash will follow in a few days or weeks. The rash is usually on one side of the body in a bandlike or beltlike pattern. Over time, the rash turns into fluid-filled blisters that break open, scab over, and dry up. Medicines may:  Help you manage pain.  Help you recover more quickly.  Help to prevent long-term problems. HOME CARE Medicines  Take medicines only as told by your doctor.  Apply an anti-itch or numbing cream to the affected area as told by your doctor. Blister and Rash Care  Take a cool bath or put cool compresses on the area of the rash or blisters as told by your doctor. This may help with pain and itching.  Keep your rash covered with a loose bandage (dressing). Wear loose-fitting clothing.  Keep your rash and blisters clean with mild soap and cool water or as told by your doctor.  Check your rash every day for signs of infection. These include redness, swelling, and pain that lasts or gets worse.  Do not pick your blisters.  Do not scratch your rash. General Instructions  Rest as told by your doctor.  Keep all follow-up visits as told by your doctor. This is important.  Until your blisters scab over, your infection can cause chickenpox in people who have never had it or been vaccinated against it. To prevent this from happening, avoid touching  other people or being around other people, especially:  Babies.  Pregnant women.  Children who have eczema.  Elderly people who have transplants.  People who have chronic illnesses, such as leukemia or AIDS. GET HELP IF:  Your pain does not get better with medicine.  Your pain does not get better after the rash heals.  Your rash looks infected. Signs of infection include:  Redness.  Swelling.  Pain that lasts or gets worse. GET HELP RIGHT AWAY IF:  The rash is on your face or nose.  You have pain in your face, pain around your eye area, or loss of feeling on one side of your face.  You have ear pain or you have ringing in your ear.  You have loss of taste.  Your condition gets worse.   This information is not intended to replace advice given to you by your health care provider. Make sure you discuss any questions you have with your health care provider.   Document Released: 05/31/2008 Document Revised: 01/03/2015 Document Reviewed: 09/24/2014 Elsevier Interactive Patient Education [employer].

## 2016-06-09 NOTE — ED Notes (Signed)
PT thinks she was bit by something 3 days ago. PT has a patchy rash that starts on left abdomen and makes it's way around left side of back. Rash does not pass midline onto right side of body. PT reports rash is itchy and painful.

## 2016-06-09 NOTE — ED Provider Notes (Signed)
CSN: [id number]     Arrival date & time 06/09/16  1410 History   First MD Initiated Contact with Patient 06/09/16 1527     Chief Complaint  Patient presents with  . Rash   (Consider location/radiation/quality/duration/timing/severity/associated sxs/prior Treatment) HPI  Rhonda Hester is a 40 y.o. female presenting to UC with c/o painful pruritic erythematous rash on the left side of her abdomen, now worsening across her Left side and into her back.  She is concerned she was bit by something 3 days ago when symptoms started as she recalls "flicking something white off my abdomen." pain is moderate to severe, even with light touch. She has taken tylenol and used hydrocortisone cream w/o relief.  Denies fever, chills, n/v/d. She does have hx of sarcoidosis. She did have chicken pox when she was younger. No exposure to others with similar rash. No new soaps, lotions, or medications.     Past Medical History  Diagnosis Date  . Seasonal allergic rhinitis   . Eczema   . Interstitial cystitis   . Allergic asthma   . Sarcoidosis of lung ([place])     DX 2006--  PULMOLOGIST--  DR [id number]  . Shortness of breath   . History of Bell's palsy     2006  RIGHT SIDE--  RESOLVED  . History of anal fissures   . History of gastroesophageal reflux (GERD)   . GERD (gastroesophageal reflux disease)    Past Surgical History  Procedure Laterality Date  . Dilation and curettage of uterus  1997  . Cystoscopy/ hydrodistention/ bladder bx  06-09-2010  . Cysto with hydrodistension N/A 08/24/2013    Procedure: CYSTOSCOPY/HYDRODISTENSION with instillation of marcaine and pyridium;  Surgeon: Fredricka Bonine, MD;  [facility];  Service: Urology;  Laterality: N/A;   Family History  Problem Relation Age of Onset  . Brain cancer Maternal Grandmother    Social History  Substance Use Topics  . Smoking status: Former Smoker -- 0.30 packs/day for 6 years    Types: Cigarettes  . Smokeless  tobacco: Never Used     Comment: ONE CIG. DAILY /  1 PPMONTH  . Alcohol Use: No   OB History    Gravida Para Term Preterm AB TAB SAB Ectopic Multiple Living   1 0 0 0 1 0 1 0 0 0      Review of Systems  Constitutional: Negative for fever and chills.  Respiratory: Negative for chest tightness and shortness of breath.   Cardiovascular: Negative for chest pain and palpitations.  Musculoskeletal: Negative for myalgias and back pain.  Skin: Positive for color change and rash. Negative for wound.    Allergies  Citric acid; Dust mite extract; Pollen extract; and Nickel  Home Medications   Prior to Admission medications   Medication Sig Start Date End Date Taking? Authorizing Provider  albuterol (PROAIR HFA) 108 (90 BASE) MCG/ACT inhaler Inhale 2 puffs into the lungs every 6 (six) hours as needed for shortness of breath. 05/15/15   Chesley Mires, MD  gabapentin (NEURONTIN) 100 MG capsule Take 1 tab on day 1, take 2 tabs on day 2, may increase to 3 tabs daily for pain 06/09/16   Noland Fordyce, PA-C  HYDROcodone-acetaminophen (NORCO/VICODIN) 5-325 MG tablet Take 1 tablet by mouth every 6 (six) hours as needed for moderate pain or severe pain. 06/09/16   Noland Fordyce, PA-C  hydrocortisone valerate cream (WESTCORT) 0.2 % Apply 1 application topically 2 (two) times daily. Reported on 05/10/2016  Historical Provider, MD  ibuprofen (ADVIL,MOTRIN) 600 MG tablet Take 1 tablet (600 mg total) by mouth every 6 (six) hours as needed. 06/09/16   Noland Fordyce, PA-C  mometasone-formoterol (DULERA) 100-5 MCG/ACT AERO Inhale 2 puffs into the lungs 2 (two) times daily. 05/15/15   Chesley Mires, MD  naproxen sodium (ANAPROX DS) 550 MG tablet Take 1 tablet (550 mg total) by mouth 2 (two) times daily with a meal. 09/08/15 09/07/16  Roselee Culver, MD  Spacer/Aero-Holding Chambers (AEROCHAMBER MV) inhaler Use as instructed 05/29/15   Tammy S Parrett, NP  valACYclovir (VALTREX) 1000 MG tablet Take 1 tablet (1,000 mg total)  by mouth 3 (three) times daily. 06/09/16   Noland Fordyce, PA-C   Meds Ordered and Administered this Visit  Medications - No data to display  BP 102/80 mmHg  Pulse 90  Temp(Src) 98.1 F (36.7 C) (Oral)  Resp 16  SpO2 100%  LMP 05/19/2016 No data found.   Physical Exam  Constitutional: She is oriented to person, place, and time. She appears well-developed and well-nourished.  HENT:  Head: Normocephalic and atraumatic.  Eyes: EOM are normal.  Neck: Normal range of motion.  Cardiovascular: Normal rate, regular rhythm and normal heart sounds.   Pulmonary/Chest: Effort normal and breath sounds normal. No respiratory distress. She has no wheezes. She has no rales.   She exhibits tenderness ( rash (see skin exam)).    Musculoskeletal: Normal range of motion.  Neurological: She is alert and oriented to person, place, and time.  Skin: Skin is warm and dry. Rash noted. There is erythema.  Left upper abdomen, Left flank, and Left mid back: erythematous papular rash with faints vesicles along dermatomal distribution. Tenderness to light touch. No active drainage.   Psychiatric: She has a normal mood and affect. Her behavior is normal.  Nursing note and vitals reviewed.   ED Course  Procedures (including critical care time)  Labs Review Labs Reviewed - No data to display  Imaging Review No results found.    MDM   1. Shingles rash    Rash c/w shingles rash w/o underlying infection.  Rx: Valtrex, ibuprofen, norco and gabapentin.  Pt has had prednisone in the past but states it makes her feel anxious. Home care instructions provided. Encouraged f/u with PCP in 1-2 weeks for recheck of symptoms if not improving, sooner if worsening.    Noland Fordyce, PA-C 06/09/16 1711

## 2016-06-14 ENCOUNTER — Ambulatory Visit (INDEPENDENT_AMBULATORY_CARE_PROVIDER_SITE_OTHER): Payer: Medicare Other | Admitting: Family Medicine

## 2016-06-14 ENCOUNTER — Other Ambulatory Visit (HOSPITAL_COMMUNITY)
Admission: RE | Admit: 2016-06-14 | Discharge: 2016-06-14 | Disposition: A | Discharge status: Home / Self Care | Payer: Medicare Other | Source: Ambulatory Visit | Attending: Family Medicine | Admitting: Family Medicine

## 2016-06-14 ENCOUNTER — Encounter: Payer: Self-pay | Admitting: Family Medicine

## 2016-06-14 VITALS — BP 125/79 | HR 95 | Temp 98.6°F | Resp 16 | Ht 67.0 in | Wt 194.0 lb

## 2016-06-14 DIAGNOSIS — R935 Abnormal findings on diagnostic imaging of other abdominal regions, including retroperitoneum: Secondary | ICD-10-CM

## 2016-06-14 DIAGNOSIS — Z113 Encounter for screening for infections with a predominantly sexual mode of transmission: Secondary | ICD-10-CM | POA: Insufficient documentation

## 2016-06-14 DIAGNOSIS — Z01419 Encounter for gynecological examination (general) (routine) without abnormal findings: Secondary | ICD-10-CM

## 2016-06-14 NOTE — Progress Notes (Signed)
Subjective:    Patient ID: Rhonda Hester, female    DOB: 1976-10-08, 40 y.o.   MRN: [id number]  Gynecologic Exam The patient's pertinent negatives include no genital itching, genital lesions, genital odor, genital rash, missed menses, pelvic pain, vaginal bleeding or vaginal discharge. Primary symptoms comment: Pt presents for a routine pap smear. She is not pregnant. Associated symptoms include abdominal pain (Ongoing left side abdominal pain). Pertinent negatives include no anorexia, back pain, chills, constipation, diarrhea, discolored urine, dysuria, fever, flank pain, frequency, headaches, hematuria, joint pain, joint swelling, nausea, painful intercourse, rash, sore throat, urgency or vomiting. She is not sexually active. She uses nothing for contraception. Her menstrual history has been regular. There is no history of a gynecological surgery, herpes simplex, menorrhagia, metrorrhagia, miscarriage, ovarian cysts, perineal abscess, an STD, a terminated pregnancy or vaginosis.   Past Medical History  Diagnosis Date  . Seasonal allergic rhinitis   . Eczema   . Interstitial cystitis   . Allergic asthma   . Sarcoidosis of lung ([place])     DX 2006--  PULMOLOGIST--  DR [id number]  . Shortness of breath   . History of Bell's palsy     2006  RIGHT SIDE--  RESOLVED  . History of anal fissures   . History of gastroesophageal reflux (GERD)   . GERD (gastroesophageal reflux disease)    Social History   Social History  . Marital Status: Single    Spouse Name: N/A  . Number of Children: N/A  . Years of Education: N/A   Occupational History  . Not on file.   Social History Main Topics  . Smoking status: Former Smoker -- 0.30 packs/day for 6 years    Types: Cigarettes  . Smokeless tobacco: Never Used     Comment: ONE CIG. DAILY /  1 PPMONTH  . Alcohol Use: No  . Drug Use: No     Comment: HX MARIJUANA USE  . Sexual Activity: Not on file   Other Topics Concern  . Not on file   Social  History Narrative   Allergies  Allergen Reactions  . Citric Acid Other (See Comments)    AVOIDS DUE TO INTERSTITIAL CYSTITIS  . Dust Mite Extract Itching and Other (See Comments)     coughing  . Pollen Extract Itching and Other (See Comments)     coughing  . Nickel Rash   Review of Systems  Constitutional: Negative for fever and chills.  HENT: Negative for sore throat.   Gastrointestinal: Positive for abdominal pain (Ongoing left side abdominal pain). Negative for nausea, vomiting, diarrhea, constipation and anorexia.  Genitourinary: Negative for dysuria, urgency, frequency, hematuria, flank pain, vaginal discharge, pelvic pain, menorrhagia and missed menses.  Musculoskeletal: Negative for back pain and joint pain.  Skin: Negative for rash.  Neurological: Negative for headaches.       Objective:   Physical Exam  Constitutional: She is oriented to person, place, and time.  HENT:  Head: Normocephalic and atraumatic.  Right Ear: External ear normal.  Left Ear: External ear normal.  Mouth/Throat: Oropharynx is clear and moist.  Eyes: Conjunctivae and EOM are normal. Pupils are equal, round, and reactive to light.  Neck: Normal range of motion. Neck supple.  Cardiovascular: Normal rate, regular rhythm, normal heart sounds and intact distal pulses.   Pulmonary/Chest: Effort normal. Right breast exhibits no inverted nipple, no mass, no nipple discharge, no skin change and no tenderness. Left breast exhibits no inverted nipple, no mass, no nipple discharge, no  skin change and no tenderness. Breasts are symmetrical.  Abdominal: Soft. Bowel sounds are normal. There is tenderness.  Genitourinary: There is no rash or tenderness on the right labia. There is no rash or tenderness on the left labia. Cervix exhibits discharge (Moderate, white, thick discharge). Cervix exhibits no motion tenderness and no friability. Vaginal discharge found.  Musculoskeletal: Normal range of motion.   Neurological: She is alert and oriented to person, place, and time. She has normal reflexes.  Skin: Skin is warm and dry.  Psychiatric: She has a normal mood and affect. Her behavior is normal. Judgment and thought content normal.     BP 125/79 mmHg  Pulse 95  Temp(Src) 98.6 F (37 C) (Oral)  Resp 16  Ht 5\' 7"  (1.702 m)  Wt 194 lb (87.998 kg)  BMI 30.38 kg/m2  SpO2 100%  LMP 05/19/2016 Assessment & Plan:  1. Encounter for routine gynecological examination Recommend monthly self breast examination.  Recommend barrier protection with sexual intercourse Will notify patient with laboratory results - Cytology - PAP [employer]  2. Abnormal abdominal ultrasound Reviewed abdominal ultrasound: Splenomegaly with an accessory spleen. The spleen was noted to have masslike parenchymal abnormalities that are not clearly evident on ultrasound. Will order a abdominal and pelvic CT as recommended.   - CT ABDOMEN W WO CONTRAST; Future - CT ABDOMEN PELVIS W WO CONTRAST; Future   RTC: Will follow up in office following CT scan   Dorena Dew, FNP

## 2016-06-14 NOTE — Patient Instructions (Signed)
Pap Test WHY AM I HAVING THIS TEST? A pap test is sometimes called a pap smear. It is a screening test that is used to check for signs of cancer of the vagina, cervix, and uterus. The test can also identify the presence of infection or precancerous changes. Your health care provider will likely recommend you have this test done on a regular basis. This test may be done:  Every 3 years, starting at age 40.  Every 5 years, in combination with testing for the presence of human papillomavirus (HPV).  More or less often depending on other medical conditions.  WHAT KIND OF SAMPLE IS TAKEN? Using a small cotton swab, plastic spatula, or brush, your health care provider will collect a sample of cells from the surface of your cervix. Your cervix is the opening to your uterus, also called a womb. Secretions from the cervix and vagina may also be collected. HOW DO I PREPARE FOR THE TEST?  Be aware of where you are in your menstrual cycle. You may be asked to reschedule the test if you are menstruating on the day of the test.  You may need to reschedule if you have a known vaginal infection on the day of the test.  You may be asked to avoid douching or taking a bath the day before or the day of the test.  Some medicines can cause abnormal test results, such as digitalis and tetracycline. Talk with your health care provider before your test if you take one of these medicines. WHAT DO THE RESULTS MEAN? Abnormal test results may indicate a number of health conditions. These may include:  Cancer. Although pap test results cannot be used to diagnose cancer of the cervix, vagina, or uterus, they may suggest the possibility of cancer. Further tests would be required to determine if cancer is present.  Sexually transmitted disease.  Fungal infection.  Parasite infection.  Herpes infection.  A condition causing or contributing to infertility. It is your responsibility to obtain your test results. Ask  the lab or department performing the test when and how you will get your results. Contact your health care provider to discuss any questions you have about your results.   This information is not intended to replace advice given to you by your health care provider. Make sure you discuss any questions you have with your health care provider.   Document Released: 03/05/2003 Document Revised: 01/03/2015 Document Reviewed: 05/06/2014 Elsevier Interactive Patient Education 2016 [employer] Inc.  

## 2016-06-16 ENCOUNTER — Other Ambulatory Visit: Payer: Self-pay | Admitting: Family Medicine

## 2016-06-16 DIAGNOSIS — R935 Abnormal findings on diagnostic imaging of other abdominal regions, including retroperitoneum: Secondary | ICD-10-CM

## 2016-06-16 LAB — CYTOLOGY - PAP

## 2016-06-18 LAB — CERVICOVAGINAL ANCILLARY ONLY: Candida vaginitis: NEGATIVE

## 2016-06-21 ENCOUNTER — Other Ambulatory Visit: Payer: Self-pay | Admitting: Family Medicine

## 2016-06-21 DIAGNOSIS — N760 Acute vaginitis: Principal | ICD-10-CM

## 2016-06-21 DIAGNOSIS — B9689 Other specified bacterial agents as the cause of diseases classified elsewhere: Secondary | ICD-10-CM

## 2016-06-21 LAB — CERVICOVAGINAL ANCILLARY ONLY: Herpes: NEGATIVE

## 2016-06-21 MED ORDER — METRONIDAZOLE 500 MG PO TABS: 500.0000 mg | ORAL_TABLET | Freq: Two times a day (BID) | ORAL | Status: DC

## 2016-06-21 NOTE — Progress Notes (Signed)
Called, spoke with patient. Advised of normal pap and of bacterial vaginosis. Advised patient to pick up metronidazole and take as directed twice daily for 1 week. Advised patient to not drink any alcohol when taking medication. Gave recommendations on avoiding certain clothing. Thanks!

## 2016-06-22 ENCOUNTER — Encounter (HOSPITAL_COMMUNITY): Payer: Self-pay

## 2016-06-22 ENCOUNTER — Ambulatory Visit (HOSPITAL_COMMUNITY)
Admission: RE | Admit: 2016-06-22 | Discharge: 2016-06-22 | Disposition: A | Discharge status: Home / Self Care | Payer: Medicare Other | Source: Ambulatory Visit | Attending: Family Medicine | Admitting: Family Medicine

## 2016-06-22 DIAGNOSIS — D259 Leiomyoma of uterus, unspecified: Secondary | ICD-10-CM | POA: Diagnosis not present

## 2016-06-22 DIAGNOSIS — R161 Splenomegaly, not elsewhere classified: Secondary | ICD-10-CM | POA: Diagnosis not present

## 2016-06-22 DIAGNOSIS — R918 Other nonspecific abnormal finding of lung field: Secondary | ICD-10-CM | POA: Diagnosis not present

## 2016-06-22 DIAGNOSIS — R935 Abnormal findings on diagnostic imaging of other abdominal regions, including retroperitoneum: Secondary | ICD-10-CM | POA: Insufficient documentation

## 2016-06-22 DIAGNOSIS — D869 Sarcoidosis, unspecified: Secondary | ICD-10-CM | POA: Diagnosis not present

## 2016-06-22 MED ORDER — IOPAMIDOL (ISOVUE-300) INJECTION 61%
100.0000 mL | Freq: Once | INTRAVENOUS | Status: AC | PRN
  Administered 2016-06-22: 100 mL via INTRAVENOUS

## 2016-07-12 ENCOUNTER — Telehealth: Payer: Self-pay

## 2016-07-12 NOTE — Telephone Encounter (Signed)
Rhonda Hester,  Patient is requesting results from Ct done the end of June. Can you please advise? Thanks!

## 2016-07-12 NOTE — Telephone Encounter (Signed)
Pt called requesting the results of her CT scan that was done during her last visit at the clinic. Pt is requesting a call back. Thanks!

## 2016-07-21 ENCOUNTER — Encounter (HOSPITAL_COMMUNITY): Payer: Self-pay | Admitting: Family Medicine

## 2016-07-21 ENCOUNTER — Ambulatory Visit (HOSPITAL_COMMUNITY)
Admission: EM | Admit: 2016-07-21 | Discharge: 2016-07-21 | Disposition: A | Discharge status: Home / Self Care | Payer: Medicare Other | Attending: Family Medicine | Admitting: Family Medicine

## 2016-07-21 DIAGNOSIS — R1012 Left upper quadrant pain: Secondary | ICD-10-CM | POA: Diagnosis not present

## 2016-07-21 DIAGNOSIS — R161 Splenomegaly, not elsewhere classified: Secondary | ICD-10-CM | POA: Diagnosis not present

## 2016-07-21 DIAGNOSIS — D869 Sarcoidosis, unspecified: Secondary | ICD-10-CM

## 2016-07-21 MED ORDER — HYDROCODONE-ACETAMINOPHEN 5-325 MG PO TABS: 2.0000 | ORAL_TABLET | ORAL | 0 refills | Status: DC | PRN

## 2016-07-21 NOTE — ED Provider Notes (Signed)
CSN: [id number]     Arrival date & time 07/21/16  1556 History   None    Chief Complaint  Patient presents with  . Abdominal Pain   (Consider location/radiation/quality/duration/timing/severity/associated sxs/prior Treatment) Patient is having LUQ abdominal pain.  She states she is having enlarged spleen problems and she had CT of abdomen last week and nobody has called her with results.   She states the pain in her left abdomen is getting worse.   The history is provided by the patient.  Abdominal Pain  Pain location:  LUQ Pain quality: aching   Pain radiates to:  Does not radiate Pain severity:  Severe Duration:  2 weeks Timing:  Constant Progression:  Worsening Chronicity:  Recurrent Relieved by:  Nothing Worsened by:  Nothing Ineffective treatments:  None tried   Past Medical History:  Diagnosis Date  . Allergic asthma   . Eczema   . GERD (gastroesophageal reflux disease)   . History of anal fissures   . History of Bell's palsy    2006  RIGHT SIDE--  RESOLVED  . History of gastroesophageal reflux (GERD)   . Interstitial cystitis   . Sarcoidosis of lung ([place])    DX 2006--  PULMOLOGIST--  DR [id number]  . Seasonal allergic rhinitis   . Shortness of breath    Past Surgical History:  Procedure Laterality Date  . CYSTO WITH HYDRODISTENSION N/A 08/24/2013   Procedure: CYSTOSCOPY/HYDRODISTENSION with instillation of marcaine and pyridium;  Surgeon: Fredricka Bonine, MD;  [facility]: [facility];  Service: Urology;  Laterality: N/A;  . CYSTOSCOPY/ HYDRODISTENTION/ BLADDER BX  06-09-2010  . DILATION AND CURETTAGE OF UTERUS  1997   Family History  Problem Relation Age of Onset  . Brain cancer Maternal Grandmother    Social History  Substance Use Topics  . Smoking status: Former Smoker    Packs/day: 0.30    Years: 6.00    Types: Cigarettes  . Smokeless tobacco: Never Used     Comment: ONE CIG. DAILY /  1 PPMONTH  . Alcohol use No   OB History    Gravida Para Term Preterm AB Living   1 0 0 0 1 0   SAB TAB Ectopic Multiple Live Births   1 0 0 0       Review of Systems  Constitutional: Negative.   HENT: Negative.   Eyes: Negative.   Respiratory: Negative.   Cardiovascular: Negative.   Gastrointestinal: Positive for abdominal pain.  Endocrine: Negative.   Genitourinary: Negative.   Musculoskeletal: Negative.   Skin: Negative.   Allergic/Immunologic: Negative.   Neurological: Negative.   Hematological: Negative.   Psychiatric/Behavioral: Negative.     Allergies  Citric acid; Dust mite extract; Pollen extract; and Nickel  Home Medications   Prior to Admission medications   Medication Sig Start Date End Date Taking? Authorizing Provider  albuterol (PROAIR HFA) 108 (90 BASE) MCG/ACT inhaler Inhale 2 puffs into the lungs every 6 (six) hours as needed for shortness of breath. 05/15/15   Chesley Mires, MD  gabapentin (NEURONTIN) 100 MG capsule Take 1 tab on day 1, take 2 tabs on day 2, may increase to 3 tabs daily for pain 06/09/16   Noland Fordyce, PA-C  HYDROcodone-acetaminophen (NORCO/VICODIN) 5-325 MG tablet Take 1 tablet by mouth every 6 (six) hours as needed for moderate pain or severe pain. 06/09/16   Noland Fordyce, PA-C  HYDROcodone-acetaminophen (NORCO/VICODIN) 5-325 MG tablet Take 2 tablets by mouth every 4 (four) hours as needed.  07/21/16   Lysbeth Penner, FNP  hydrocortisone valerate cream (WESTCORT) 0.2 % Apply 1 application topically 2 (two) times daily. Reported on 06/14/2016    Historical Provider, MD  ibuprofen (ADVIL,MOTRIN) 600 MG tablet Take 1 tablet (600 mg total) by mouth every 6 (six) hours as needed. 06/09/16   Noland Fordyce, PA-C  metroNIDAZOLE (FLAGYL) 500 MG tablet Take 1 tablet (500 mg total) by mouth 2 (two) times daily. 06/21/16   Dorena Dew, FNP  mometasone-formoterol (DULERA) 100-5 MCG/ACT AERO Inhale 2 puffs into the lungs 2 (two) times daily. 05/15/15   Chesley Mires, MD  naproxen sodium (ANAPROX DS)  550 MG tablet Take 1 tablet (550 mg total) by mouth 2 (two) times daily with a meal. 09/08/15 09/07/16  Roselee Culver, MD  Spacer/Aero-Holding Chambers (AEROCHAMBER MV) inhaler Use as instructed 05/29/15   Tammy S Parrett, NP  valACYclovir (VALTREX) 1000 MG tablet Take 1 tablet (1,000 mg total) by mouth 3 (three) times daily. 06/09/16   Noland Fordyce, PA-C   Meds Ordered and Administered this Visit  Medications - No data to display  BP 126/92 (BP Location: Left Arm)   Pulse 75   Temp 97.8 F (36.6 C) (Oral)   Resp 20   Ht 5\' 7"  (1.702 m)   Wt 194 lb (88 kg)   LMP 06/19/2016 Comment: pregnancy waiver signed 06-22-2016  SpO2 96%   BMI 30.38 kg/m  No data found.   Physical Exam  Urgent Care Course   Clinical Course    Procedures (including critical care time)  Labs Review Labs Reviewed - No data to display  Imaging Review No results found.   Visual Acuity Review  Right Eye Distance:   Left Eye Distance:   Bilateral Distance:    Right Eye Near:   Left Eye Near:    Bilateral Near:         MDM   1. Spleen enlarged   2. LUQ abdominal pain   3. Sarcoidosis ([place])    Norco 5mg  one po q 6 hours prn pain #6 Patient states she got a phone call from her PCP office and they told her results of her CT and splenomegaly are due to sarcoidosis and she is made and appointment to follow up with a doctor at the practice tomorrow at 1430. Follow up with PCP Dr. Marylene Buerger for enlarged spleen due to sarcoidosis.    Lysbeth Penner, FNP 07/21/16 1730

## 2016-07-21 NOTE — ED Triage Notes (Signed)
Pt here pr LUQ pain that started over a month ago. sts she was seen by her doctor and they performed a CT scan which showed sarcoidosis. sts that the pain is unbearable and now she is having swelling in that area.

## 2016-07-22 ENCOUNTER — Encounter: Payer: Self-pay | Admitting: Adult Health

## 2016-07-22 ENCOUNTER — Ambulatory Visit (INDEPENDENT_AMBULATORY_CARE_PROVIDER_SITE_OTHER)
Admission: RE | Admit: 2016-07-22 | Discharge: 2016-07-22 | Disposition: A | Discharge status: Home / Self Care | Payer: Medicare Other | Source: Ambulatory Visit | Attending: Adult Health | Admitting: Adult Health

## 2016-07-22 ENCOUNTER — Ambulatory Visit (INDEPENDENT_AMBULATORY_CARE_PROVIDER_SITE_OTHER): Payer: Medicare Other | Admitting: Adult Health

## 2016-07-22 VITALS — BP 134/88 | HR 93 | Temp 98.5°F | Ht 66.0 in | Wt 193.0 lb

## 2016-07-22 DIAGNOSIS — D860 Sarcoidosis of lung: Secondary | ICD-10-CM

## 2016-07-22 MED ORDER — PREDNISONE 10 MG PO TABS: ORAL_TABLET | ORAL | 0 refills | Status: DC

## 2016-07-22 NOTE — Progress Notes (Signed)
Dg Chest 2 View  Result Date: 07/22/2016 CLINICAL DATA:  History of sarcoidosis, smoking. Left lower lobe pain, cough, headaches for 3 days. EXAM: CHEST  2 VIEW COMPARISON:  04/21/2016 FINDINGS: Heart size is normal. Patchy densities are again identified in the lungs, most notably in the left perihilar, right apical region, and right lower lobe. Density in these regions appear stable. There is slight increased opacity at the left lung base. Acute infectious infiltrate in this region cannot be entirely excluded. Air bronchograms are identified throughout the areas of opacity. No pleural effusions or definite pulmonary edema identified. IMPRESSION: Increased Left lower lobe opacity which may indicate acute infectious process versus progression of sarcoidosis. Electronically Signed   By: Nolon Nations M.D.   On: 07/22/2016 16:03   Agree with plan for prednisone trial.  Will need f/u CT chest/abdomen to monitor response.  Will need to go slow with prednisone taper.  Chesley Mires, MD [facility] 07/22/2016, 4:34 PM Pager:  [phone]

## 2016-07-22 NOTE — Progress Notes (Signed)
Subjective:    Patient ID: Rhonda Hester, female    DOB: 05-22-1976, 40 y.o.   MRN: [id number]  HPI 40 year old female, smoker with asthma, allergic rhinitis and sarcoid with pulmonary and skin involvement  07/22/2016 follow-up sarcoid Patient returns for a two-month follow-up. Patient continues to have intermittent shortness of breath, dry cough, sinus drainage, intermittent wheezing and headaches. Denies any sinus drainage, fever, nausea or vomiting.  Seen by dermatology did not think facial acne was from Sarcoid.  Has been having some lower abdominal pain. Has been seen by her gynecologist Recent CT abdomen and pelvis showed multiple small uterine fibroids, increased interstitial densities along the lung bases consistent with her history of sarcoid and severe splenomegaly with multiple lobular low densities around the parenchyma which are increased from previous exam. Patient feels that her symptoms have increased over the last few weeks. Spirometry today shows normal lung function with an FEV1 at 79%, ratio 81, FVC 80%. He denies any fever, hemoptysis, rash, orthopnea, increased leg swelling, vomiting, visual changes.    CT chest 12/30/04 >> b/l hilar and mediastinal adenopathy with b/l nodular interstitial opacities  CT chest 07/08/08 >> decreased lymph nodes, scarring with volume loss  RAST 07/22/11 >> Multiple allergens, IgE 71.8  PFT 09/02/11 >> FEV1 2.80(93%), FEV1% 82, TLC 4.74(89%), DLCO 59%, DLCO/VA 95%, no BD response.  PFT 09/25/13 >> FEV1 2.75 (95%), FEV1% 81, TLC 4.25 (77%), DLCO 64%, no BD response. CT chest 11/20/13 >> Scattered areas of ATX CT chest 04/02/14 >> calcified Rt hilar LAN, scattered areas of scarring   Review of Systems Constitutional:   No  weight loss, night sweats,  Fevers, chills, + fatigue, or  lassitude.  HEENT:   No headaches,  Difficulty swallowing,  Tooth/dental problems, or  Sore throat,                No sneezing, itching, ear ache,  nasal congestion, post nasal drip,   CV:  No chest pain,  Orthopnea, PND, swelling in lower extremities, anasarca, dizziness, palpitations, syncope.   GI  No heartburn, indigestion, abdominal pain, nausea, vomiting, diarrhea, change in bowel habits, loss of appetite, bloody stools.   Resp:    No chest wall deformity  Skin: no rash or lesions.  GU: no dysuria, change in color of urine, no urgency or frequency.  No flank pain, no hematuria   MS: + joint pain   Psych:  No change in mood or affect. No depression or anxiety.  No memory loss.         Objective:   Physical Exam  Vitals:   07/22/16 1419  BP: 134/88  Pulse: 93  Temp: 98.5 F (36.9 C)  TempSrc: Oral  SpO2: 97%  Weight: 193 lb (87.5 kg)  Height: 5\' 6"  (1.676 m)  Body mass index is 31.15 kg/m.   GEN: A/Ox3; pleasant , NAD, obese    HEENT:  [place]/AT,  EACs-clear, TMs-wnl, NOSE-clear, THROAT-clear, no lesions, no postnasal drip or exudate noted.   NECK:  Supple w/ fair ROM; no JVD; normal carotid impulses w/o bruits; no thyromegaly or nodules palpated; no lymphadenopathy.    RESP  Clear  P & A; w/o, wheezes/ rales/ or rhonchi. no accessory muscle use, no dullness to percussion  CARD:  RRR, no m/r/g  , no peripheral edema, pulses intact, no cyanosis or clubbing.  GI:   Soft & nt; nml bowel sounds; no organomegaly or masses detected.   Musco: Warm bil, no deformities or joint swelling  noted.   Neuro: alert, no focal deficits noted.    Skin: Warm, no lesions or rashes  Elvira Langston NP-C  [facility]  07/22/2016

## 2016-07-22 NOTE — Patient Instructions (Signed)
Begin prednisone 20 mg daily for 2 weeks, then 10 mg daily for 1 week, then 5mg  daily for 1 week and stop .  Chest x-ray and labs today Continue on Dulera 2 puffs twice daily, rinse after use Follow-up with primary care doctor and gynecologist for abdominal pain, and ongoing pain requirements Follow up Dr. Halford Chessman  In 2 months and As needed   Please contact office for sooner follow up if symptoms do not improve or worsen or seek emergency care

## 2016-07-22 NOTE — Addendum Note (Signed)
Addended by: Osa Craver on: 07/22/2016 03:13 PM   Modules accepted: Orders

## 2016-07-22 NOTE — Assessment & Plan Note (Signed)
?  flare will try short course of steroid challenge  Check cxr , CBC , LFT and ACE level.   Plan  Begin prednisone 20 mg daily for 2 weeks, then 10 mg daily for 1 week, then 5mg  daily for 1 week and stop .  Chest x-ray and labs today Continue on Dulera 2 puffs twice daily, rinse after use Follow-up with primary care doctor and gynecologist for abdominal pain, and ongoing pain requirements Follow up Dr. Halford Chessman  In 2 months and As needed   Please contact office for sooner follow up if symptoms do not improve or worsen or seek emergency care

## 2016-07-26 NOTE — Progress Notes (Signed)
Called spoke with pt. Reviewed results and recs. Pt voiced understanding and had no further questions.

## 2016-08-12 ENCOUNTER — Ambulatory Visit (HOSPITAL_COMMUNITY)
Admission: EM | Admit: 2016-08-12 | Discharge: 2016-08-12 | Disposition: A | Discharge status: Home / Self Care | Payer: Medicare Other | Attending: Family Medicine | Admitting: Family Medicine

## 2016-08-12 ENCOUNTER — Encounter (HOSPITAL_COMMUNITY): Payer: Self-pay | Admitting: Emergency Medicine

## 2016-08-12 DIAGNOSIS — A499 Bacterial infection, unspecified: Secondary | ICD-10-CM | POA: Diagnosis not present

## 2016-08-12 DIAGNOSIS — Z87891 Personal history of nicotine dependence: Secondary | ICD-10-CM | POA: Insufficient documentation

## 2016-08-12 DIAGNOSIS — N760 Acute vaginitis: Secondary | ICD-10-CM | POA: Insufficient documentation

## 2016-08-12 DIAGNOSIS — N898 Other specified noninflammatory disorders of vagina: Secondary | ICD-10-CM

## 2016-08-12 DIAGNOSIS — B9689 Other specified bacterial agents as the cause of diseases classified elsewhere: Secondary | ICD-10-CM | POA: Insufficient documentation

## 2016-08-12 LAB — POCT URINALYSIS DIP (DEVICE)
BILIRUBIN URINE: NEGATIVE
Glucose, UA: NEGATIVE mg/dL
KETONES UR: NEGATIVE mg/dL
Leukocytes, UA: NEGATIVE
Nitrite: NEGATIVE
PH: 6 (ref 5.0–8.0)
Protein, ur: 30 mg/dL — AB
Specific Gravity, Urine: >=1.03 (ref 1.005–1.030)
Urobilinogen, UA: 1 mg/dL (ref 0.0–1.0)

## 2016-08-12 MED ORDER — METRONIDAZOLE 500 MG PO TABS: 500.0000 mg | ORAL_TABLET | Freq: Two times a day (BID) | ORAL | 0 refills | Status: DC

## 2016-08-12 MED ORDER — FLUCONAZOLE 150 MG PO TABS: ORAL_TABLET | ORAL | 0 refills | Status: DC

## 2016-08-12 NOTE — ED Provider Notes (Signed)
CSN: [id number]     Arrival date & time 08/12/16  1000 History   First MD Initiated Contact with Patient 08/12/16 1052     Chief Complaint  Patient presents with  . Vaginitis   (Consider location/radiation/quality/duration/timing/severity/associated sxs/prior Treatment) 40 year old female complaining of vaginal irritation for 3 days. It is associated with a vaginal discharge. Last evening she experienced some burning with urination. Denies pelvic pain. LMP is May July. She has not missed any menstrual periods. Denies abdominal pain, fever or chills.      Past Medical History:  Diagnosis Date  . Allergic asthma   . Eczema   . GERD (gastroesophageal reflux disease)   . History of anal fissures   . History of Bell's palsy    2006  RIGHT SIDE--  RESOLVED  . History of gastroesophageal reflux (GERD)   . Interstitial cystitis   . Sarcoidosis of lung ([place])    DX 2006--  PULMOLOGIST--  DR [id number]  . Seasonal allergic rhinitis   . Shortness of breath    Past Surgical History:  Procedure Laterality Date  . CYSTO WITH HYDRODISTENSION N/A 08/24/2013   Procedure: CYSTOSCOPY/HYDRODISTENSION with instillation of marcaine and pyridium;  Surgeon: Fredricka Bonine, MD;  Location: [facility];  Service: Urology;  Laterality: N/A;  . CYSTOSCOPY/ HYDRODISTENTION/ BLADDER BX  06-09-2010  . DILATION AND CURETTAGE OF UTERUS  1997   Family History  Problem Relation Age of Onset  . Brain cancer Maternal Grandmother    Social History  Substance Use Topics  . Smoking status: Former Smoker    Packs/day: 0.30    Years: 6.00    Types: Cigarettes  . Smokeless tobacco: Never Used     Comment: ONE CIG. DAILY /  1 PPMONTH  . Alcohol use No   OB History    Gravida Para Term Preterm AB Living   1 0 0 0 1 0   SAB TAB Ectopic Multiple Live Births   1 0 0 0       Review of Systems  Constitutional: Negative.  Negative for activity change, chills, diaphoresis, fatigue and fever.   HENT: Negative.   Respiratory: Negative for cough and shortness of breath.   Cardiovascular: Negative for chest pain.  Gastrointestinal: Negative.   Genitourinary: Positive for dysuria, vaginal discharge and vaginal pain. Negative for frequency and pelvic pain.  Musculoskeletal: Negative.   Skin: Negative.   Neurological: Negative.   All other systems reviewed and are negative.   Allergies  Citric acid; Dust mite extract; Pollen extract; and Nickel  Home Medications   Prior to Admission medications   Medication Sig Start Date End Date Taking? Authorizing Provider  benzocaine-resorcinol (VAGISIL) 5-2 % vaginal cream Place vaginally at bedtime.   Yes Historical Provider, MD  albuterol (PROAIR HFA) 108 (90 BASE) MCG/ACT inhaler Inhale 2 puffs into the lungs every 6 (six) hours as needed for shortness of breath. 05/15/15   Chesley Mires, MD  fluconazole (DIFLUCAN) 150 MG tablet 1 tab po x 1. May repeat in 72 hours if no improvement 08/12/16   Janne Napoleon, NP  gabapentin (NEURONTIN) 100 MG capsule Take 1 tab on day 1, take 2 tabs on day 2, may increase to 3 tabs daily for pain 06/09/16   Noland Fordyce, PA-C  HYDROcodone-acetaminophen (NORCO/VICODIN) 5-325 MG tablet Take 2 tablets by mouth every 4 (four) hours as needed. 07/21/16   Lysbeth Penner, FNP  hydrocortisone valerate cream (WESTCORT) 0.2 % Apply 1 application topically 2 (two)  times daily. Reported on 06/14/2016    Historical Provider, MD  ibuprofen (ADVIL,MOTRIN) 600 MG tablet Take 1 tablet (600 mg total) by mouth every 6 (six) hours as needed. Patient not taking: Reported on 07/22/2016 06/09/16   Noland Fordyce, PA-C  metroNIDAZOLE (FLAGYL) 500 MG tablet Take 1 tablet (500 mg total) by mouth 2 (two) times daily. X 7 days 08/12/16   Janne Napoleon, NP  mometasone-formoterol (DULERA) 100-5 MCG/ACT AERO Inhale 2 puffs into the lungs 2 (two) times daily. 05/15/15   Chesley Mires, MD  naproxen sodium (ANAPROX DS) 550 MG tablet Take 1 tablet (550 mg  total) by mouth 2 (two) times daily with a meal. Patient not taking: Reported on 07/22/2016 09/08/15 09/07/16  Roselee Culver, MD  predniSONE (DELTASONE) 10 MG tablet 2 tabs daily for 2 week , 1 tab daliy for 1 week and 1/2 tab daily for 1 week and stop . 07/22/16   Melvenia Needles, NP  Spacer/Aero-Holding Chambers (AEROCHAMBER MV) inhaler Use as instructed 05/29/15   Melvenia Needles, NP   Meds Ordered and Administered this Visit  Medications - No data to display  BP 112/73 (BP Location: Left Arm) Comment (BP Location): large cuff  Pulse 74   Temp 98 F (36.7 C) (Oral)   Resp 16   LMP 07/21/2016 Comment: pregnancy waiver signed 06-22-2016  SpO2 97%  No data found.   Physical Exam  Constitutional: She is oriented to person, place, and time. She appears well-developed and well-nourished. No distress.  HENT:  Head: Normocephalic and atraumatic.  Eyes: EOM are normal.  Neck: Normal range of motion. Neck supple.  Cardiovascular: Normal rate.   Pulmonary/Chest: Effort normal. No respiratory distress.  Abdominal: Soft. She exhibits no distension and no mass. There is no tenderness. There is no guarding.  Genitourinary:  Genitourinary Comments: Normal external female genitalia Intravaginal discharge. Pearline Cables to green discharge. Ectocervix with patches of erythema and rough texture.  Patient notes that she had a Pap smear in the past few months and was treated with a medication.  Musculoskeletal: She exhibits no edema.  Neurological: She is alert and oriented to person, place, and time. She exhibits normal muscle tone.  Skin: Skin is warm and dry.  Psychiatric: She has a normal mood and affect.  Nursing note and vitals reviewed.   Urgent Care Course   Clinical Course    Procedures (including critical care time)  Labs Review Labs Reviewed  POCT URINALYSIS DIP (DEVICE) - Abnormal; Notable for the following:       Result Value   Hgb urine dipstick TRACE (*)    Protein, ur 30 (*)     All other components within normal limits  CERVICOVAGINAL ANCILLARY ONLY   Results for orders placed or performed during the hospital encounter of 08/12/16  POCT urinalysis dip (device)  Result Value Ref Range   Glucose, UA NEGATIVE NEGATIVE mg/dL   Bilirubin Urine NEGATIVE NEGATIVE   Ketones, ur NEGATIVE NEGATIVE mg/dL   Specific Gravity, Urine >=1.030 1.005 - 1.030   Hgb urine dipstick TRACE (A) NEGATIVE   pH 6.0 5.0 - 8.0   Protein, ur 30 (A) NEGATIVE mg/dL   Urobilinogen, UA 1.0 0.0 - 1.0 mg/dL   Nitrite NEGATIVE NEGATIVE   Leukocytes, UA NEGATIVE NEGATIVE    Imaging Review No results found.   Visual Acuity Review  Right Eye Distance:   Left Eye Distance:   Bilateral Distance:    Right Eye Near:   Left Eye Near:  Bilateral Near:         MDM   1. Vaginitis   2. Vaginal discharge   3. BV (bacterial vaginosis)    Meds ordered this encounter  Medications  . benzocaine-resorcinol (VAGISIL) 5-2 % vaginal cream    Sig: Place vaginally at bedtime.  . metroNIDAZOLE (FLAGYL) 500 MG tablet    Sig: Take 1 tablet (500 mg total) by mouth 2 (two) times daily. X 7 days    Dispense:  14 tablet    Refill:  0    Order Specific Question:   Supervising Provider    Answer:   Sherlene Shams [id number]  . fluconazole (DIFLUCAN) 150 MG tablet    Sig: 1 tab po x 1. May repeat in 72 hours if no improvement    Dispense:  2 tablet    Refill:  0    Order Specific Question:   Supervising Provider    Answer:   Sherlene Shams [id number]   Cervical cytology pending. May also apply Monistat to external genitalia Follow with PCP    Janne Napoleon, NP 08/12/16 [place], NP 08/12/16 [place], NP 08/12/16 1505

## 2016-08-12 NOTE — ED Triage Notes (Signed)
Patient reports vaginal irritation, itching labia.  Patient reports vaginal discharge.  Reports burning with urination and this is primary reason for coming today

## 2016-08-13 LAB — CERVICOVAGINAL ANCILLARY ONLY
CHLAMYDIA, DNA PROBE: NEGATIVE
NEISSERIA GONORRHEA: NEGATIVE
Wet Prep (BD Affirm): POSITIVE — AB

## 2016-08-20 ENCOUNTER — Telehealth (HOSPITAL_COMMUNITY): Payer: Self-pay | Admitting: Emergency Medicine

## 2016-08-20 NOTE — Telephone Encounter (Signed)
-----   Message from Sherlene Shams, MD sent at 08/13/2016  7:36 PM EDT ----- Please let patient know that test for candida (yeast) was positive; rx for fluconazole was given at [facility] visit 08/12/16.  Recheck or followup pcp/Lachina Hollis for further evaluation if symptoms persist.  LM

## 2016-08-20 NOTE — Telephone Encounter (Signed)
LM on pt's VM [phone] Need to see how pt is doing and to give lab results from recent visit on 8/17 Also let pt know labs can be obtained from [facility]

## 2016-09-06 ENCOUNTER — Ambulatory Visit (INDEPENDENT_AMBULATORY_CARE_PROVIDER_SITE_OTHER): Payer: Medicare Other

## 2016-09-06 ENCOUNTER — Encounter (HOSPITAL_COMMUNITY): Payer: Self-pay | Admitting: Family Medicine

## 2016-09-06 ENCOUNTER — Ambulatory Visit (HOSPITAL_COMMUNITY)
Admission: EM | Admit: 2016-09-06 | Discharge: 2016-09-06 | Disposition: A | Discharge status: Home / Self Care | Payer: Medicare Other | Attending: Family Medicine | Admitting: Family Medicine

## 2016-09-06 DIAGNOSIS — J209 Acute bronchitis, unspecified: Secondary | ICD-10-CM | POA: Diagnosis not present

## 2016-09-06 DIAGNOSIS — D860 Sarcoidosis of lung: Secondary | ICD-10-CM

## 2016-09-06 MED ORDER — LEVOFLOXACIN 500 MG PO TABS: 500.0000 mg | ORAL_TABLET | Freq: Every day | ORAL | 0 refills | Status: DC

## 2016-09-06 NOTE — ED Provider Notes (Signed)
[place]    CSN: [id number] Arrival date & time: 09/06/16  1043  First Provider Contact:  First MD Initiated Contact with Patient 09/06/16 1223        History   Chief Complaint Chief Complaint  Patient presents with  . Cough    HPI Rhonda Hester is a 40 y.o. female.    Cough  Cough characteristics:  Productive Sputum characteristics:  Yellow Severity:  Mild Onset quality:  Gradual Duration:  4 weeks Progression:  Worsening Chronicity:  New Smoker: yes   Relieved by:  None tried Worsened by:  Nothing Ineffective treatments:  Cough suppressants Associated symptoms: chest pain and sore throat   Associated symptoms: no fever, no rhinorrhea, no shortness of breath and no wheezing     Past Medical History:  Diagnosis Date  . Allergic asthma   . Eczema   . GERD (gastroesophageal reflux disease)   . History of anal fissures   . History of Bell's palsy    2006  RIGHT SIDE--  RESOLVED  . History of gastroesophageal reflux (GERD)   . Interstitial cystitis   . Sarcoidosis of lung ([place])    DX 2006--  PULMOLOGIST--  DR [id number]  . Seasonal allergic rhinitis   . Shortness of breath     Patient Active Problem List   Diagnosis Date Noted  . Acne vulgaris 05/10/2016  . BMI 31.0-31.9,adult 05/10/2016  . Weight gain 05/10/2016  . Right hip pain 10/27/2014  . Hemorrhagic ovarian cyst 06/26/2014  . Sarcoidosis of lung ([place])   . Cervical adenopathy 03/12/2014  . History of smoking 08/07/2013  . Allergic asthma 07/22/2011    Past Surgical History:  Procedure Laterality Date  . CYSTO WITH HYDRODISTENSION N/A 08/24/2013   Procedure: CYSTOSCOPY/HYDRODISTENSION with instillation of marcaine and pyridium;  Surgeon: Fredricka Bonine, MD;  Location: [facility];  Service: Urology;  Laterality: N/A;  . CYSTOSCOPY/ HYDRODISTENTION/ BLADDER BX  06-09-2010  . DILATION AND CURETTAGE OF UTERUS  1997    OB History    Gravida Para Term Preterm  AB Living   1 0 0 0 1 0   SAB TAB Ectopic Multiple Live Births   1 0 0 0         Home Medications    Prior to Admission medications   Medication Sig Start Date End Date Taking? Authorizing Provider  albuterol (PROAIR HFA) 108 (90 BASE) MCG/ACT inhaler Inhale 2 puffs into the lungs every 6 (six) hours as needed for shortness of breath. 05/15/15   Chesley Mires, MD  mometasone-formoterol (DULERA) 100-5 MCG/ACT AERO Inhale 2 puffs into the lungs 2 (two) times daily. 05/15/15   Chesley Mires, MD  Spacer/Aero-Holding Chambers (AEROCHAMBER MV) inhaler Use as instructed 05/29/15   Melvenia Needles, NP    Family History Family History  Problem Relation Age of Onset  . Brain cancer Maternal Grandmother     Social History Social History  Substance Use Topics  . Smoking status: Former Smoker    Packs/day: 0.30    Years: 6.00    Types: Cigarettes  . Smokeless tobacco: Never Used     Comment: ONE CIG. DAILY /  1 PPMONTH  . Alcohol use No     Allergies   Citric acid; Dust mite extract; Pollen extract; and Nickel   Review of Systems Review of Systems  Constitutional: Negative.  Negative for fever.  HENT: Positive for congestion and sore throat. Negative for postnasal drip and rhinorrhea.   Respiratory:  Positive for cough. Negative for shortness of breath and wheezing.   Cardiovascular: Positive for chest pain.  Gastrointestinal: Negative.   All other systems reviewed and are negative.    Physical Exam Triage Vital Signs ED Triage Vitals  Enc Vitals Group     BP 09/06/16 1206 120/74     Pulse Rate 09/06/16 1206 64     Resp 09/06/16 1206 15     Temp 09/06/16 1206 98.7 F (37.1 C)     Temp Source 09/06/16 1206 Oral     SpO2 09/06/16 1206 100 %     Weight --      Height --      Head Circumference --      Peak Flow --      Pain Score 09/06/16 1214 10     Pain Loc --      Pain Edu? --      Excl. in [place]? --    No data found.   Updated Vital Signs BP 120/74 (BP Location:  Left Arm)   Pulse 64   Temp 98.7 F (37.1 C) (Oral)   Resp 15   SpO2 100%   Visual Acuity Right Eye Distance:   Left Eye Distance:   Bilateral Distance:    Right Eye Near:   Left Eye Near:    Bilateral Near:     Physical Exam  Constitutional: She appears well-developed and well-nourished.  HENT:  Right Ear: External ear normal.  Left Ear: External ear normal.  Mouth/Throat: Oropharynx is clear and moist.  Neck: Normal range of motion. Neck supple.  Cardiovascular: Normal rate, regular rhythm, normal heart sounds and intact distal pulses.   Pulmonary/Chest: Effort normal. She has rhonchi. She exhibits tenderness.  Lymphadenopathy:    She has no cervical adenopathy.  Nursing note and vitals reviewed.    UC Treatments / Results  Labs (all labs ordered are listed, but only abnormal results are displayed) Labs Reviewed - No data to display  EKG  EKG Interpretation None       Radiology No results found. X-rays reviewed and report per radiologist.  Procedures Procedures (including critical care time)  Medications Ordered in UC Medications - No data to display   Initial Impression / Assessment and Plan / UC Course  I have reviewed the triage vital signs and the nursing notes.  Pertinent labs & imaging results that were available during my care of the patient were reviewed by me and considered in my medical decision making (see chart for details).  Clinical Course      Final Clinical Impressions(s) / UC Diagnoses   Final diagnoses:  None    New Prescriptions New Prescriptions   No medications on file     Billy Fischer, MD 09/06/16 2119

## 2016-09-06 NOTE — Discharge Instructions (Signed)
No more smoking EVER. Take all of medicine, drink lots of water.

## 2016-09-06 NOTE — ED Triage Notes (Signed)
Pt here for cough x 1 month. sts this morning she saw some traces of blood. sts chest pain and sore throat with coughing.

## 2016-09-08 NOTE — Telephone Encounter (Signed)
Seen on 9/11 here at the [facility]

## 2016-09-24 ENCOUNTER — Ambulatory Visit (INDEPENDENT_AMBULATORY_CARE_PROVIDER_SITE_OTHER): Payer: Medicare Other | Admitting: Pulmonary Disease

## 2016-09-24 ENCOUNTER — Other Ambulatory Visit (INDEPENDENT_AMBULATORY_CARE_PROVIDER_SITE_OTHER): Payer: Medicare Other

## 2016-09-24 ENCOUNTER — Encounter: Payer: Self-pay | Admitting: Pulmonary Disease

## 2016-09-24 VITALS — BP 108/72 | HR 74 | Ht 66.0 in | Wt 195.4 lb

## 2016-09-24 DIAGNOSIS — Z720 Tobacco use: Secondary | ICD-10-CM

## 2016-09-24 DIAGNOSIS — F17200 Nicotine dependence, unspecified, uncomplicated: Secondary | ICD-10-CM

## 2016-09-24 DIAGNOSIS — J45909 Unspecified asthma, uncomplicated: Secondary | ICD-10-CM | POA: Diagnosis not present

## 2016-09-24 DIAGNOSIS — D860 Sarcoidosis of lung: Secondary | ICD-10-CM | POA: Diagnosis not present

## 2016-09-24 LAB — COMPREHENSIVE METABOLIC PANEL
ALBUMIN: 3.8 g/dL (ref 3.5–5.2)
ALK PHOS: 118 U/L — AB (ref 39–117)
ALT: 36 U/L — AB (ref 0–35)
AST: 27 U/L (ref 0–37)
BILIRUBIN TOTAL: 0.8 mg/dL (ref 0.2–1.2)
BUN: 6 mg/dL (ref 6–23)
CO2: 28 mEq/L (ref 19–32)
CREATININE: 0.68 mg/dL (ref 0.40–1.20)
Calcium: 8.9 mg/dL (ref 8.4–10.5)
Chloride: 105 mEq/L (ref 96–112)
GFR: 123.35 mL/min (ref >60.00)
Glucose, Bld: 74 mg/dL (ref 70–99)
POTASSIUM: 3.7 meq/L (ref 3.5–5.1)
SODIUM: 138 meq/L (ref 135–145)
TOTAL PROTEIN: 8.1 g/dL (ref 6.0–8.3)

## 2016-09-24 LAB — CBC WITH DIFFERENTIAL/PLATELET
BASOS ABS: 0.1 10*3/uL (ref 0.0–0.1)
Basophils Relative: 1 % (ref 0.0–3.0)
EOS PCT: 6.3 % — AB (ref 0.0–5.0)
Eosinophils Absolute: 0.4 10*3/uL (ref 0.0–0.7)
HCT: 37 % (ref 36.0–46.0)
HEMOGLOBIN: 12.4 g/dL (ref 12.0–15.0)
Lymphocytes Relative: 32.5 % (ref 12.0–46.0)
Lymphs Abs: 2 10*3/uL (ref 0.7–4.0)
MCHC: 33.7 g/dL (ref 30.0–36.0)
MCV: 81.3 fl (ref 78.0–100.0)
MONO ABS: 0.5 10*3/uL (ref 0.1–1.0)
MONOS PCT: 7.7 % (ref 3.0–12.0)
Neutro Abs: 3.2 10*3/uL (ref 1.4–7.7)
Neutrophils Relative %: 52.5 % (ref 43.0–77.0)
Platelets: 264 10*3/uL (ref 150.0–400.0)
RBC: 4.55 Mil/uL (ref 3.87–5.11)
RDW: 14.9 % (ref 11.5–15.5)
WBC: 6.2 10*3/uL (ref 4.0–10.5)

## 2016-09-24 LAB — HEPATIC FUNCTION PANEL
ALBUMIN: 3.8 g/dL (ref 3.5–5.2)
ALT: 36 U/L — AB (ref 0–35)
AST: 29 U/L (ref 0–37)
Alkaline Phosphatase: 119 U/L — ABNORMAL HIGH (ref 39–117)
BILIRUBIN DIRECT: 0.1 mg/dL (ref 0.0–0.3)
Total Bilirubin: 0.8 mg/dL (ref 0.2–1.2)
Total Protein: 8 g/dL (ref 6.0–8.3)

## 2016-09-24 NOTE — Patient Instructions (Signed)
Will schedule CT chest with contrast Will schedule pulmonary function test CBC with differential and CMET today  Follow up in 2 months

## 2016-09-24 NOTE — Progress Notes (Signed)
Current Outpatient Prescriptions on File Prior to Visit  Medication Sig  . albuterol (PROAIR HFA) 108 (90 BASE) MCG/ACT inhaler Inhale 2 puffs into the lungs every 6 (six) hours as needed for shortness of breath.  . mometasone-formoterol (DULERA) 100-5 MCG/ACT AERO Inhale 2 puffs into the lungs 2 (two) times daily.  Marland Kitchen Spacer/Aero-Holding Chambers (AEROCHAMBER MV) inhaler Use as instructed   Current Facility-Administered Medications on File Prior to Visit  Medication  . bupivacaine (MARCAINE) 0.5 % 15 mL, phenazopyridine (PYRIDIUM) 400 mg bladder mixture    Chief Complaint  Patient presents with  . Follow-up    Pt c/o still having wheezing and coughing at night. Mucus production - clear-white in color.     Pulmonary tests CT chest 12/30/04 >> b/l hilar and mediastinal adenopathy with b/l nodular interstitial opacities  CT chest 07/08/08 >> decreased lymph nodes, scarring with volume loss  RAST 07/22/11 >> Multiple allergens, IgE 71.8  PFT 09/02/11 >> FEV1 2.80(93%), FEV1% 82, TLC 4.74(89%), DLCO 59%, DLCO/VA 95%, no BD response.  PFT 09/25/13 >> FEV1 2.75 (95%), FEV1% 81, TLC 4.25 (77%), DLCO 64%, no BD response. CT chest 11/20/13 >>  Scattered areas of ATX CT chest 04/02/14 >> calcified Rt hilar LAN, scattered areas of scarring   History of Present Illness: Rhonda Hester is a 40 y.o. female smoker with asthma, allergic rhinitis, and sarcoidosis (pulmonary, skin involvement, Bell's palsy) firsts dx in 2006.  She started smoking again.  She still has cough with clear to yellow sputum.  This got worse after she started smoking.  She felt her breathing was better on prednisone, but this made her feel depressed and gain weight.  She does not want to use prednisone again.  She was told her spleen enlargement was from sarcoidosis.  She gets some stiffness in her MCP joints b/l.  She denies fever or hemoptysis.  She used dulera and this helps.  She saw a video about a woman who go the flu  shot, and then could only walk backwards.  As a result she is scared to get the flu shot.  Physical Exam:  General - No distress ENT - No sinus tenderness, no oral exudate, no LAN Cardiac - s1s2 regular, no murmur Chest - b/l faint expiratory wheezing Back - No focal tenderness Abd - Soft, non-tender Ext - no edema Neuro - Normal strength Skin - no rashes Psych - normal mood, and behavior   Assessment/Plan:  Pulmonary sarcoidosis. - will repeat CT chest, labs, and PFT - if she has disease progression, will then need to resume therapy >> she is reluctant to use prednisone again  Allergic asthma. - continue dulera, proair  Tobacco abuse. - discussed need to stop smoking   Patient Instructions  Will schedule CT chest with contrast Will schedule pulmonary function test CBC with differential and CMET today  Follow up in 2 months   Chesley Mires, MD [place] [facility]/Sleep Pager:  [phone] 09/24/2016, 3:48 PM

## 2016-09-28 LAB — ANGIOTENSIN CONVERTING ENZYME: ANGIOTENSIN-CONVERTING ENZYME: 219 U/L — AB (ref 9–67)

## 2016-10-01 ENCOUNTER — Ambulatory Visit (INDEPENDENT_AMBULATORY_CARE_PROVIDER_SITE_OTHER)
Admission: RE | Admit: 2016-10-01 | Discharge: 2016-10-01 | Disposition: A | Discharge status: Home / Self Care | Payer: Medicare Other | Source: Ambulatory Visit | Attending: Adult Health | Admitting: Adult Health

## 2016-10-01 DIAGNOSIS — D860 Sarcoidosis of lung: Secondary | ICD-10-CM

## 2016-10-01 DIAGNOSIS — F17200 Nicotine dependence, unspecified, uncomplicated: Secondary | ICD-10-CM

## 2016-10-01 DIAGNOSIS — J45909 Unspecified asthma, uncomplicated: Secondary | ICD-10-CM

## 2016-10-01 MED ORDER — IOPAMIDOL (ISOVUE-300) INJECTION 61%
80.0000 mL | Freq: Once | INTRAVENOUS | Status: AC | PRN
  Administered 2016-10-01: 80 mL via INTRAVENOUS

## 2016-10-21 ENCOUNTER — Telehealth: Payer: Self-pay | Admitting: Pulmonary Disease

## 2016-10-21 MED ORDER — PREDNISONE 10 MG PO TABS: ORAL_TABLET | ORAL | 1 refills | Status: DC

## 2016-10-21 NOTE — Telephone Encounter (Signed)
CMP Latest Ref Rng & Units 09/24/2016 09/24/2016 05/10/2016  Glucose 70 - 99 mg/dL - 74 76  BUN 6 - 23 mg/dL - 6 7  Creatinine 0.40 - 1.20 mg/dL - 0.68 0.62  Sodium 135 - 145 mEq/L - 138 136  Potassium 3.5 - 5.1 mEq/L - 3.7 3.5  Chloride 96 - 112 mEq/L - 105 103  CO2 19 - 32 mEq/L - 28 23  Calcium 8.4 - 10.5 mg/dL - 8.9 9.5  Total Protein 6.0 - 8.3 g/dL 8.0 8.1 7.6  Total Bilirubin 0.2 - 1.2 mg/dL 0.8 0.8 0.7  Alkaline Phos 39 - 117 U/L 119(H) 118(H) 153(H)  AST 0 - 37 U/L 29 27 38(H)  ALT 0 - 35 U/L 36(H) 36(H) 46(H)    CBC Latest Ref Rng & Units 09/24/2016 05/10/2016 09/04/2015  WBC 4.0 - 10.5 K/uL 6.2 4.8 4.7  Hemoglobin 12.0 - 15.0 g/dL 12.4 10.9(L) 10.7(A)  Hematocrit 36.0 - 46.0 % 37.0 33.8(L) 35.2(A)  Platelets 150.0 - 400.0 K/uL 264.0 287 -    ACE 09/24/16 >> 219  CT chest 10/01/16 >> stable scarring and traction BTX   Results d/w pt.  She is still having dyspnea, wheeze, cough, sputum, joint pain and stiffness.  Will start her on prednisone 20 mg daily for 2 weeks, then 10 mg daily until next ROV.

## 2016-11-15 ENCOUNTER — Encounter: Payer: Self-pay | Admitting: Family Medicine

## 2016-11-15 ENCOUNTER — Other Ambulatory Visit: Payer: Self-pay | Admitting: Family Medicine

## 2016-11-15 ENCOUNTER — Ambulatory Visit (INDEPENDENT_AMBULATORY_CARE_PROVIDER_SITE_OTHER): Payer: Medicare Other | Admitting: Family Medicine

## 2016-11-15 VITALS — BP 140/86 | HR 73 | Temp 98.6°F | Resp 18 | Ht 66.0 in | Wt 199.0 lb

## 2016-11-15 DIAGNOSIS — N644 Mastodynia: Secondary | ICD-10-CM

## 2016-11-15 DIAGNOSIS — Z1239 Encounter for other screening for malignant neoplasm of breast: Secondary | ICD-10-CM

## 2016-11-17 NOTE — Progress Notes (Signed)
Rhonda Hester, is a 40 y.o. female  [id number]  [id number]  DOB - May 07, 1976  CC:  Chief Complaint  Patient presents with  . Breast Pain    left breast pain x 1 month        HPI: Corrine Hester is a 40 y.o. female presents after trying to schedule a mammogram. She was told she would need to be seen to determine whether screening or diagnostic mammogram needed.  She is complaining of a month long history of pain and tenderness in her left breast from around 3-6 on clock.   Allergies  Allergen Reactions  . Citric Acid Other (See Comments)    AVOIDS DUE TO INTERSTITIAL CYSTITIS  . Dust Mite Extract Itching and Other (See Comments)     coughing  . Pollen Extract Itching and Other (See Comments)     coughing  . Nickel Rash   Past Medical History:  Diagnosis Date  . Allergic asthma   . Eczema   . GERD (gastroesophageal reflux disease)   . History of anal fissures   . History of Bell's palsy    2006  RIGHT SIDE--  RESOLVED  . History of gastroesophageal reflux (GERD)   . Interstitial cystitis   . Sarcoidosis of lung ([place])    DX 2006--  PULMOLOGIST--  DR [id number]  . Seasonal allergic rhinitis   . Shortness of breath    Current Outpatient Prescriptions on File Prior to Visit  Medication Sig Dispense Refill  . albuterol (PROAIR HFA) 108 (90 BASE) MCG/ACT inhaler Inhale 2 puffs into the lungs every 6 (six) hours as needed for shortness of breath. 1 Inhaler 5  . mometasone-formoterol (DULERA) 100-5 MCG/ACT AERO Inhale 2 puffs into the lungs 2 (two) times daily. 1 Inhaler 5  . predniSONE (DELTASONE) 10 MG tablet 2 pills daily for 2 weeks, then 1 pill daily 60 tablet 1  . Spacer/Aero-Holding Chambers (AEROCHAMBER MV) inhaler Use as instructed (Patient not taking: Reported on 11/15/2016) 1 each 0   Current Facility-Administered Medications on File Prior to Visit  Medication Dose Route Frequency Provider Last Rate Last Dose  . bupivacaine (MARCAINE) 0.5 % 15 mL,  phenazopyridine (PYRIDIUM) 400 mg bladder mixture   Bladder Instillation Once Festus Aloe, MD       Family History  Problem Relation Age of Onset  . Brain cancer Maternal Grandmother    Social History   Social History  . Marital status: Single    Spouse name: N/A  . Number of children: N/A  . Years of education: N/A   Occupational History  . Not on file.   Social History Main Topics  . Smoking status: Current Every Day Smoker    Packs/day: 0.30    Years: 6.00    Types: Cigarettes  . Smokeless tobacco: Never Used     Comment: ONE CIG. DAILY /  1 PPMONTH  . Alcohol use No  . Drug use: No     Comment: HX MARIJUANA USE  . Sexual activity: Not on file   Other Topics Concern  . Not on file   Social History Narrative  . No narrative on file     Objective:   Vitals:   11/15/16 1511  BP: 140/86  Pulse: 73  Resp: 18  Temp: 98.6 F (37 C)    Physical Exam:   There is tenderness of the left lower, outer quadrant of the breast. No lumps appreciated.  Lab Results  Component Value Date   WBC 6.2  09/24/2016   HGB 12.4 09/24/2016   HCT 37.0 09/24/2016   MCV 81.3 09/24/2016   PLT 264.0 09/24/2016   Lab Results  Component Value Date   CREATININE 0.68 09/24/2016   BUN 6 09/24/2016   NA 138 09/24/2016   K 3.7 09/24/2016   CL 105 09/24/2016   CO2 28 09/24/2016    Lab Results  Component Value Date   HGBA1C 5.5 06/22/2010   Lipid Panel     Component Value Date/Time   HDL 48 11/03/2009 2201        Assessment and plan:   1. Breast pain  - MM Digital Diagnostic Unilat L; Future   Return if symptoms worsen or fail to improve.  The patient was given clear instructions to go to ER or return to medical center if symptoms don't improve, worsen or new problems develop. The patient verbalized understanding.    Micheline Chapman FNP  11/17/2016, 1:20 PM

## 2016-11-24 ENCOUNTER — Ambulatory Visit (INDEPENDENT_AMBULATORY_CARE_PROVIDER_SITE_OTHER): Payer: Medicare Other | Admitting: Pulmonary Disease

## 2016-11-24 ENCOUNTER — Encounter: Payer: Self-pay | Admitting: Pulmonary Disease

## 2016-11-24 VITALS — BP 98/70 | HR 81 | Ht 67.0 in | Wt 200.0 lb

## 2016-11-24 DIAGNOSIS — D860 Sarcoidosis of lung: Secondary | ICD-10-CM

## 2016-11-24 DIAGNOSIS — J45909 Unspecified asthma, uncomplicated: Secondary | ICD-10-CM

## 2016-11-24 DIAGNOSIS — R05 Cough: Secondary | ICD-10-CM | POA: Diagnosis not present

## 2016-11-24 DIAGNOSIS — D869 Sarcoidosis, unspecified: Secondary | ICD-10-CM | POA: Diagnosis not present

## 2016-11-24 DIAGNOSIS — R058 Other specified cough: Secondary | ICD-10-CM

## 2016-11-24 DIAGNOSIS — J4530 Mild persistent asthma, uncomplicated: Secondary | ICD-10-CM | POA: Diagnosis not present

## 2016-11-24 DIAGNOSIS — M2550 Pain in unspecified joint: Secondary | ICD-10-CM

## 2016-11-24 DIAGNOSIS — F17200 Nicotine dependence, unspecified, uncomplicated: Secondary | ICD-10-CM

## 2016-11-24 DIAGNOSIS — Z720 Tobacco use: Secondary | ICD-10-CM

## 2016-11-24 LAB — PULMONARY FUNCTION TEST
DL/VA % PRED: 70 %
DL/VA: 3.65 ml/min/mmHg/L
DLCO COR % PRED: 54 %
DLCO UNC % PRED: 56 %
DLCO UNC: 15.92 ml/min/mmHg
DLCO cor: 15.47 ml/min/mmHg
FEF 25-75 POST: 2.42 L/s
FEF 25-75 PRE: 2.1 L/s
FEF2575-%CHANGE-POST: 14 %
FEF2575-%PRED-POST: 79 %
FEF2575-%Pred-Pre: 68 %
FEV1-%CHANGE-POST: 4 %
FEV1-%PRED-POST: 89 %
FEV1-%Pred-Pre: 85 %
FEV1-PRE: 2.42 L
FEV1-Post: 2.52 L
FEV1FVC-%CHANGE-POST: 2 %
FEV1FVC-%PRED-PRE: 93 %
FEV6-%Change-Post: 0 %
FEV6-%Pred-Post: 92 %
FEV6-%Pred-Pre: 91 %
FEV6-PRE: 3.09 L
FEV6-Post: 3.12 L
FEV6FVC-%Change-Post: 0 %
FEV6FVC-%PRED-PRE: 101 %
FEV6FVC-%Pred-Post: 101 %
FVC-%Change-Post: 1 %
FVC-%Pred-Post: 91 %
FVC-%Pred-Pre: 90 %
FVC-PRE: 3.09 L
FVC-Post: 3.13 L
POST FEV1/FVC RATIO: 80 %
PRE FEV6/FVC RATIO: 100 %
Post FEV6/FVC ratio: 100 %
Pre FEV1/FVC ratio: 78 %
RV % PRED: 87 %
RV: 1.52 L
TLC % pred: 84 %
TLC: 4.65 L

## 2016-11-24 NOTE — Patient Instructions (Signed)
Change prednisone to 5 mg daily for next two weeks, and then stop prednisone  Use salt water spray for sinuses daily for next two weeks, then as needed  Use nasacort one spray in each nostril daily for next two weeks, then as needed  Will arrange for referral to rheumatologist  Follow up in 2 months with Dr. Halford Chessman or Nurse Practitioner

## 2016-11-24 NOTE — Progress Notes (Signed)
Current Outpatient Prescriptions on File Prior to Visit  Medication Sig  . albuterol (PROAIR HFA) 108 (90 BASE) MCG/ACT inhaler Inhale 2 puffs into the lungs every 6 (six) hours as needed for shortness of breath.  . mometasone-formoterol (DULERA) 100-5 MCG/ACT AERO Inhale 2 puffs into the lungs 2 (two) times daily.  . predniSONE (DELTASONE) 10 MG tablet 2 pills daily for 2 weeks, then 1 pill daily  . Spacer/Aero-Holding Chambers (AEROCHAMBER MV) inhaler Use as instructed   Current Facility-Administered Medications on File Prior to Visit  Medication  . bupivacaine (MARCAINE) 0.5 % 15 mL, phenazopyridine (PYRIDIUM) 400 mg bladder mixture    Chief Complaint  Patient presents with  . Follow-up    Review PFT today. Pt c/o wheezing and coughing with mucus production, light brown in color. States that she feels tightness in her lungs, not sure if any tightness in her chest. Worse at night when laying down to sleep. Pt sleeps on right side.  Discuss Prednisone and weight gain.    Pulmonary tests RAST 07/22/11 >> Multiple allergens, IgE 71.8  PFT 09/02/11 >> FEV1 2.80(93%), FEV1% 82, TLC 4.74(89%), DLCO 59%, no BD PFT 09/25/13 >> FEV1 2.75 (95%), FEV1% 81, TLC 4.25 (77%), DLCO 64%, no BD ACE 09/24/16 >> 219 PFT 11/24/16 >> FEV1 2.52 (89%), FEV1% 80, TLC 4.65 (84%), DLCO 56%, no BD  CT chests CT chest 12/30/04 >> b/l hilar and mediastinal adenopathy with b/l nodular interstitial opacities  CT chest 07/08/08 >> decreased lymph nodes, scarring with volume loss  CT chest 11/20/13 >>  Scattered areas of ATX CT chest 04/02/14 >> calcified Rt hilar LAN, scattered areas of scarring CT chest 10/01/16 >> stable scarring and traction BTX  Past medical history GERD  Past surgical history, Family history, Social history, Allergies reviewed  Vital signs BP 98/70 (BP Location: Left Arm, Cuff Size: Normal)   Pulse 81   Ht 5\' 7"  (1.702 m)   Wt 200 lb (90.7 kg)   SpO2 98%   BMI 31.32 kg/m    History  of Present Illness: Rhonda Hester is a 40 y.o. female smoker with asthma, allergic rhinitis, and sarcoidosis (pulmonary, skin involvement, Bell's palsy) firsts dx in 2006.  She doesn't feel like prednisone has helped.  She feels more depressed from weight gain.  She still smokes >> she feels bored, and then smokes.  She continues to have pain in her left side, and in her joints.  She takes ibuprofen and this helps some.    She gets sinus drainage, especially at night.  She gets wheeze from her throat and cough with clear sputum.  She clears her throat frequently.  PFT today didn't show significant progression compared to 2014.  Physical Exam:  General - No distress ENT - Boggy nasal mucosa, no sinus tenderness, no oral exudate, no LAN Cardiac - s1s2 regular, no murmur Chest - no wheeze Back - No focal tenderness Abd - Soft, non-tender Ext - no edema Neuro - Normal strength Skin - no rashes Psych - normal mood, and behavior   Assessment/Plan:  Pulmonary sarcoidosis. - she has noticed improvement with trial of prednisone - will have her gradually wean off prednisone and monitor response  Upper airway cough syndrome from post nasal drip. - nasal irrigation and nasacort  Allergic asthma. - continue dulera, proair  Tobacco abuse. - discussed need to stop smoking  Arthralgias. - difficult to determine whether this is related to sarcoidosis, but this is a persistent issue - will arrange for  referral to rheumatology to further assess   Patient Instructions  Change prednisone to 5 mg daily for next two weeks, and then stop prednisone  Use salt water spray for sinuses daily for next two weeks, then as needed  Use nasacort one spray in each nostril daily for next two weeks, then as needed  Will arrange for referral to rheumatologist  Follow up in 2 months with Dr. Halford Chessman or Nurse Practitioner   Chesley Mires, MD [place] Pager:   [phone] 11/24/2016, 12:52 PM

## 2016-11-30 ENCOUNTER — Other Ambulatory Visit: Payer: Self-pay | Admitting: Family Medicine

## 2016-12-05 ENCOUNTER — Emergency Department (HOSPITAL_COMMUNITY)
Admission: EM | Admit: 2016-12-05 | Discharge: 2016-12-05 | Disposition: A | Discharge status: Home / Self Care | Payer: Medicare Other | Attending: Emergency Medicine | Admitting: Emergency Medicine

## 2016-12-05 ENCOUNTER — Emergency Department (HOSPITAL_COMMUNITY): Payer: Medicare Other

## 2016-12-05 ENCOUNTER — Encounter (HOSPITAL_COMMUNITY): Payer: Self-pay | Admitting: *Deleted

## 2016-12-05 ENCOUNTER — Encounter (HOSPITAL_COMMUNITY): Payer: Self-pay | Admitting: Emergency Medicine

## 2016-12-05 ENCOUNTER — Ambulatory Visit (HOSPITAL_COMMUNITY)
Admission: EM | Admit: 2016-12-05 | Discharge: 2016-12-05 | Disposition: A | Discharge status: Nursing Home (Includes ICF) | Payer: Medicare Other | Attending: Emergency Medicine | Admitting: Emergency Medicine

## 2016-12-05 DIAGNOSIS — F17210 Nicotine dependence, cigarettes, uncomplicated: Secondary | ICD-10-CM | POA: Diagnosis not present

## 2016-12-05 DIAGNOSIS — N83511 Torsion of right ovary and ovarian pedicle: Secondary | ICD-10-CM | POA: Insufficient documentation

## 2016-12-05 DIAGNOSIS — M5441 Lumbago with sciatica, right side: Secondary | ICD-10-CM

## 2016-12-05 DIAGNOSIS — M79604 Pain in right leg: Secondary | ICD-10-CM

## 2016-12-05 DIAGNOSIS — R102 Pelvic and perineal pain: Secondary | ICD-10-CM | POA: Diagnosis not present

## 2016-12-05 DIAGNOSIS — R1024 Suprapubic pain: Secondary | ICD-10-CM

## 2016-12-05 DIAGNOSIS — R1030 Lower abdominal pain, unspecified: Secondary | ICD-10-CM | POA: Diagnosis present

## 2016-12-05 DIAGNOSIS — N83519 Torsion of ovary and ovarian pedicle, unspecified side: Secondary | ICD-10-CM

## 2016-12-05 LAB — COMPREHENSIVE METABOLIC PANEL
ALT: 49 U/L (ref 14–54)
ANION GAP: 7 (ref 5–15)
AST: 39 U/L (ref 15–41)
Albumin: 4.1 g/dL (ref 3.5–5.0)
Alkaline Phosphatase: 123 U/L (ref 38–126)
BILIRUBIN TOTAL: 0.8 mg/dL (ref 0.3–1.2)
BUN: 9 mg/dL (ref 6–20)
CO2: 26 mmol/L (ref 22–32)
Calcium: 9.7 mg/dL (ref 8.9–10.3)
Chloride: 104 mmol/L (ref 101–111)
Creatinine, Ser: 0.65 mg/dL (ref 0.44–1.00)
GFR calc Af Amer: >60 mL/min (ref >60)
Glucose, Bld: 84 mg/dL (ref 65–99)
POTASSIUM: 4.1 mmol/L (ref 3.5–5.1)
Sodium: 137 mmol/L (ref 135–145)
TOTAL PROTEIN: 7.9 g/dL (ref 6.5–8.1)

## 2016-12-05 LAB — CBC
HEMATOCRIT: 39.1 % (ref 36.0–46.0)
HEMOGLOBIN: 13.3 g/dL (ref 12.0–15.0)
MCH: 28.2 pg (ref 26.0–34.0)
MCHC: 34 g/dL (ref 30.0–36.0)
MCV: 83 fL (ref 78.0–100.0)
Platelets: 266 10*3/uL (ref 150–400)
RBC: 4.71 MIL/uL (ref 3.87–5.11)
RDW: 13.3 % (ref 11.5–15.5)
WBC: 6.7 10*3/uL (ref 4.0–10.5)

## 2016-12-05 LAB — PROTIME-INR
INR: 0.96
Prothrombin Time: 12.8 seconds (ref 11.4–15.2)

## 2016-12-05 LAB — WET PREP, GENITAL
Clue Cells Wet Prep HPF POC: NONE SEEN
SPERM: NONE SEEN
TRICH WET PREP: NONE SEEN
YEAST WET PREP: NONE SEEN

## 2016-12-05 LAB — POCT URINALYSIS DIP (DEVICE)
Bilirubin Urine: NEGATIVE
Glucose, UA: NEGATIVE mg/dL
HGB URINE DIPSTICK: NEGATIVE
Ketones, ur: NEGATIVE mg/dL
LEUKOCYTES UA: NEGATIVE
NITRITE: NEGATIVE
PH: 5.5 (ref 5.0–8.0)
PROTEIN: NEGATIVE mg/dL
Specific Gravity, Urine: >=1.03 (ref 1.005–1.030)
UROBILINOGEN UA: 0.2 mg/dL (ref 0.0–1.0)

## 2016-12-05 LAB — URINALYSIS, ROUTINE W REFLEX MICROSCOPIC
Bilirubin Urine: NEGATIVE
Glucose, UA: NEGATIVE mg/dL
Hgb urine dipstick: NEGATIVE
KETONES UR: NEGATIVE mg/dL
LEUKOCYTES UA: NEGATIVE
NITRITE: NEGATIVE
PH: 5 (ref 5.0–8.0)
Protein, ur: NEGATIVE mg/dL
SPECIFIC GRAVITY, URINE: 1.02 (ref 1.005–1.030)

## 2016-12-05 LAB — I-STAT BETA HCG BLOOD, ED (MC, WL, AP ONLY)

## 2016-12-05 LAB — LIPASE, BLOOD: Lipase: 24 U/L (ref 11–51)

## 2016-12-05 MED ORDER — SODIUM CHLORIDE 0.9 % IV BOLUS (SEPSIS)
1000.0000 mL | Freq: Once | INTRAVENOUS | Status: AC
  Administered 2016-12-05: 1000 mL via INTRAVENOUS

## 2016-12-05 MED ORDER — HYDROCODONE-ACETAMINOPHEN 5-325 MG PO TABS
ORAL_TABLET | ORAL | Status: AC
  Filled 2016-12-05: qty 1

## 2016-12-05 MED ORDER — ACETAMINOPHEN 500 MG PO TABS
1000.0000 mg | ORAL_TABLET | Freq: Once | ORAL | Status: AC
  Administered 2016-12-05: 1000 mg via ORAL
  Filled 2016-12-05: qty 2

## 2016-12-05 MED ORDER — OXYCODONE HCL 5 MG PO TABS
5.0000 mg | ORAL_TABLET | Freq: Once | ORAL | Status: AC
Start: 2016-12-05 — End: 2016-12-05
  Administered 2016-12-05: 5 mg via ORAL
  Filled 2016-12-05: qty 1

## 2016-12-05 MED ORDER — ONDANSETRON HCL 4 MG/2ML IJ SOLN: 4.0000 mg | Freq: Once | INTRAMUSCULAR | Status: DC

## 2016-12-05 MED ORDER — DIAZEPAM 5 MG PO TABS
5.0000 mg | ORAL_TABLET | Freq: Once | ORAL | Status: AC
  Administered 2016-12-05: 5 mg via ORAL
  Filled 2016-12-05: qty 1

## 2016-12-05 MED ORDER — HYDROCODONE-ACETAMINOPHEN 5-325 MG PO TABS
1.0000 | ORAL_TABLET | Freq: Once | ORAL | Status: AC
  Administered 2016-12-05: 1 via ORAL

## 2016-12-05 MED ORDER — MORPHINE SULFATE (PF) 4 MG/ML IV SOLN: 2.0000 mg | Freq: Once | INTRAVENOUS | Status: DC

## 2016-12-05 MED ORDER — KETOROLAC TROMETHAMINE 30 MG/ML IJ SOLN
15.0000 mg | Freq: Once | INTRAMUSCULAR | Status: AC
  Administered 2016-12-05: 15 mg via INTRAVENOUS
  Filled 2016-12-05: qty 1

## 2016-12-05 NOTE — ED Notes (Signed)
Pt reports unable to obtain urine sample at triage.

## 2016-12-05 NOTE — ED Triage Notes (Signed)
Pt sent here from [facility] due to lower abd pain that radiates down right leg. Denies any n/v/d, urinary or vaginal symptoms.

## 2016-12-05 NOTE — ED Provider Notes (Signed)
[facility]    CSN: [id number] Arrival date & time: 12/05/16  1308     History   Chief Complaint Chief Complaint  Patient presents with  . Leg Pain  . Abdominal Pain    HPI Rhonda Hester is a 40 y.o. female.   HPI  She is a 40 year old woman here for evaluation of abdominal and leg pain. Her symptoms started 4 days ago with pain across the pelvis and lower abdomen. She describes it as sharp and stabbing pains. It is fairly constant. She has tried heat without improvement. She denies any urinary symptoms. She had a normal bowel movement this morning but did not affect the pain. No nausea or vomiting. No vaginal discharge or new sexual partners.  A day after the abdominal pain started, she developed sharp pains going down the right leg. Initially the pains were in the lateral calf, but now they are present above the knee and in the lateral hip and right lower back. The pain is pretty constant. The leg pain is a little bit worse if she is lying still and slightly better if she is walking around. No difference to palpation or rubbing. She has been taking ibuprofen and some Goody powders without improvement.  No fevers. She does have a history of interstitial cystitis.  Past Medical History:  Diagnosis Date  . Allergic asthma   . Eczema   . GERD (gastroesophageal reflux disease)   . History of anal fissures   . History of Bell's palsy    2006  RIGHT SIDE--  RESOLVED  . History of gastroesophageal reflux (GERD)   . Interstitial cystitis   . Sarcoidosis of lung ([place])    DX 2006--  PULMOLOGIST--  DR [id number]  . Seasonal allergic rhinitis   . Shortness of breath     Patient Active Problem List   Diagnosis Date Noted  . Acne vulgaris 05/10/2016  . BMI 31.0-31.9,adult 05/10/2016  . Weight gain 05/10/2016  . Right hip pain 10/27/2014  . Hemorrhagic ovarian cyst 06/26/2014  . Sarcoidosis of lung ([place])   . Cervical adenopathy 03/12/2014  . History of smoking  08/07/2013  . Allergic asthma 07/22/2011    Past Surgical History:  Procedure Laterality Date  . CYSTO WITH HYDRODISTENSION N/A 08/24/2013   Procedure: CYSTOSCOPY/HYDRODISTENSION with instillation of marcaine and pyridium;  Surgeon: Fredricka Bonine, MD;  Location: [facility];  Service: Urology;  Laterality: N/A;  . CYSTOSCOPY/ HYDRODISTENTION/ BLADDER BX  06-09-2010  . DILATION AND CURETTAGE OF UTERUS  1997    OB History    Gravida Para Term Preterm AB Living   1 0 0 0 1 0   SAB TAB Ectopic Multiple Live Births   1 0 0 0         Home Medications    Prior to Admission medications   Medication Sig Start Date End Date Taking? Authorizing Provider  albuterol (PROAIR HFA) 108 (90 BASE) MCG/ACT inhaler Inhale 2 puffs into the lungs every 6 (six) hours as needed for shortness of breath. 05/15/15   Chesley Mires, MD  mometasone-formoterol (DULERA) 100-5 MCG/ACT AERO Inhale 2 puffs into the lungs 2 (two) times daily. 05/15/15   Chesley Mires, MD  predniSONE (DELTASONE) 10 MG tablet 2 pills daily for 2 weeks, then 1 pill daily 10/21/16   Chesley Mires, MD  Spacer/Aero-Holding Chambers (AEROCHAMBER MV) inhaler Use as instructed 05/29/15   Melvenia Needles, NP    Family History Family History  Problem Relation  Age of Onset  . Brain cancer Maternal Grandmother     Social History Social History  Substance Use Topics  . Smoking status: Current Every Day Smoker    Packs/day: 0.30    Years: 6.00    Types: Cigarettes  . Smokeless tobacco: Never Used     Comment: ONE CIG. DAILY /  1 PPMONTH  . Alcohol use No     Allergies   Citric acid; Dust mite extract; Pollen extract; and Nickel   Review of Systems Review of Systems As in history of present illness  Physical Exam Triage Vital Signs ED Triage Vitals  Enc Vitals Group     BP 12/05/16 1346 115/81     Pulse Rate 12/05/16 1346 74     Resp 12/05/16 1346 16     Temp 12/05/16 1346 98.4 F (36.9 C)     Temp  Source 12/05/16 1346 Oral     SpO2 12/05/16 1346 97 %     Weight 12/05/16 1346 194 lb (88 kg)     Height 12/05/16 1346 5\' 6"  (1.676 m)     Head Circumference --      Peak Flow --      Pain Score 12/05/16 1349 10     Pain Loc --      Pain Edu? --      Excl. in [place]? --    No data found.   Updated Vital Signs BP 115/81 (BP Location: Right Arm)   Pulse 74   Temp 98.4 F (36.9 C) (Oral)   Resp 16   Ht 5\' 6"  (1.676 m)   Wt 194 lb (88 kg)   LMP 11/26/2016   SpO2 97%   BMI 31.31 kg/m   Visual Acuity Right Eye Distance:   Left Eye Distance:   Bilateral Distance:    Right Eye Near:   Left Eye Near:    Bilateral Near:     Physical Exam  Constitutional: She is oriented to person, place, and time. She appears well-developed and well-nourished. She appears distressed (writhing slightly on exam table.  Constantly rubbing leg.).  Cardiovascular: Normal rate.   Pulmonary/Chest: Effort normal.  Abdominal: Soft. Bowel sounds are normal. She exhibits no distension and no mass. There is tenderness (RLQ and suprapubic). There is guarding (voluntary). There is no rebound.  Musculoskeletal:  Right leg: No significant swelling or erythema. No point tenderness. Negative psoas sign. Perhaps slight swelling in the right posterior hip compared to the left.  Neurological: She is alert and oriented to person, place, and time.     UC Treatments / Results  Labs (all labs ordered are listed, but only abnormal results are displayed) Labs Reviewed  POCT URINALYSIS DIP (DEVICE)    EKG  EKG Interpretation None       Radiology No results found.  Procedures Procedures (including critical care time)  Medications Ordered in UC Medications  HYDROcodone-acetaminophen (NORCO/VICODIN) 5-325 MG per tablet 1 tablet (1 tablet Oral Given 12/05/16 1406)     Initial Impression / Assessment and Plan / UC Course  I have reviewed the triage vital signs and the nursing notes.  Pertinent labs &  imaging results that were available during my care of the patient were reviewed by me and considered in my medical decision making (see chart for details).  Clinical Course     I don't have a clear etiology for her pain. No urinary symptoms to suggest acute infection and UA is clear. No reported vaginal discharge or  symptoms to suggest pelvic inflammatory disease. No back pain to suggest radicular pain to the right leg.  Concern for something acute going on in the pelvis given degree of pain.  Hydrocodone given here and we'll transfer to [facility] ED via shuttle for additional evaluation and management.  Final Clinical Impressions(s) / UC Diagnoses   Final diagnoses:  Pelvic pain  Right leg pain    New Prescriptions Discharge Medication List as of 12/05/2016  2:08 PM       Melony Overly, MD 12/05/16 1420

## 2016-12-05 NOTE — Discharge Instructions (Signed)
I don't have a clear cause for ear pain. Given that it started in your belly and spread to your leg, I am concerned about something going on in your lower abdomen. Please go to the emergency room so they can do some additional testing to rule out bad things.

## 2016-12-05 NOTE — Discharge Instructions (Signed)
Take 4 over the counter ibuprofen tablets 3 times a day or 2 over-the-counter naproxen tablets twice a day for pain. Also take tylenol 1000mg(2 extra strength) four times a day.    

## 2016-12-05 NOTE — ED Triage Notes (Signed)
PT has history of interstitial cystitis. PT reports generalized lower abdominal pain. PT had a BM this AM. PT denies N/V/D. PT also reports sharp shooting pains down right leg.

## 2016-12-05 NOTE — ED Provider Notes (Signed)
[facility] DEPT Provider Note   CSN: [id number] Arrival date & time: 12/05/16  1414     History   Chief Complaint Chief Complaint  Patient presents with  . Abdominal Pain  . Leg Pain    HPI Rhonda Hester is a 40 y.o. female.  40 yo F with a cc of R low back pain.  Going on for about a week.  Denies injury, worse when lying flat.  Denies vomiting, diarrhea, fevers.  Mild constipation.  Not associated with food.  Denies urinary symptoms.     The history is provided by the patient.  Abdominal Pain   This is a new problem. The current episode started more than 2 days ago. The problem occurs constantly. The problem has not changed since onset.The pain is associated with an unknown factor. The pain is located in the suprapubic region. The quality of the pain is sharp, pressure-like and throbbing. The pain is at a severity of 8/10. The pain is moderate. Associated symptoms include arthralgias and myalgias. Pertinent negatives include fever, nausea, vomiting, dysuria and headaches. Nothing aggravates the symptoms. Nothing relieves the symptoms.  Leg Pain      Past Medical History:  Diagnosis Date  . Allergic asthma   . Eczema   . GERD (gastroesophageal reflux disease)   . History of anal fissures   . History of Bell's palsy    2006  RIGHT SIDE--  RESOLVED  . History of gastroesophageal reflux (GERD)   . Interstitial cystitis   . Sarcoidosis of lung ([place])    DX 2006--  PULMOLOGIST--  DR [id number]  . Seasonal allergic rhinitis   . Shortness of breath     Patient Active Problem List   Diagnosis Date Noted  . Acne vulgaris 05/10/2016  . BMI 31.0-31.9,adult 05/10/2016  . Weight gain 05/10/2016  . Right hip pain 10/27/2014  . Hemorrhagic ovarian cyst 06/26/2014  . Sarcoidosis of lung ([place])   . Cervical adenopathy 03/12/2014  . History of smoking 08/07/2013  . Allergic asthma 07/22/2011    Past Surgical History:  Procedure Laterality Date  . CYSTO WITH HYDRODISTENSION  N/A 08/24/2013   Procedure: CYSTOSCOPY/HYDRODISTENSION with instillation of marcaine and pyridium;  Surgeon: Fredricka Bonine, MD;  [facility];  Service: Urology;  Laterality: N/A;  . CYSTOSCOPY/ HYDRODISTENTION/ BLADDER BX  06-09-2010  . DILATION AND CURETTAGE OF UTERUS  1997    OB History    Gravida Para Term Preterm AB Living   1 0 0 0 1 0   SAB TAB Ectopic Multiple Live Births   1 0 0 0         Home Medications    Prior to Admission medications   Medication Sig Start Date End Date Taking? Authorizing Provider  albuterol (PROAIR HFA) 108 (90 BASE) MCG/ACT inhaler Inhale 2 puffs into the lungs every 6 (six) hours as needed for shortness of breath. 05/15/15  Yes Chesley Mires, MD  LUTEIN PO Take 1 tablet by mouth daily.   Yes Historical Provider, MD  mometasone (NASONEX) 50 MCG/ACT nasal spray Place 2 sprays into the nose daily.   Yes Historical Provider, MD  mometasone-formoterol (DULERA) 100-5 MCG/ACT AERO Inhale 2 puffs into the lungs 2 (two) times daily. 05/15/15  Yes Chesley Mires, MD  Multiple Vitamin (MULTIVITAMIN WITH MINERALS) TABS tablet Take 1 tablet by mouth daily.   Yes Historical Provider, MD  Multiple Vitamins-Minerals (BODY/HAIR/SKIN/NAILS PO) Take 1 tablet by mouth daily.   Yes Historical Provider, MD  Omega-3 Fatty Acids (FISH OIL) 1000 MG CAPS Take 1,000 mg by mouth daily.   Yes Historical Provider, MD  predniSONE (DELTASONE) 10 MG tablet 2 pills daily for 2 weeks, then 1 pill daily Patient taking differently: Take 5 mg by mouth daily with breakfast.  10/21/16  Yes Chesley Mires, MD  sodium chloride (OCEAN) 0.65 % SOLN nasal spray Place 1 spray into both nostrils as needed for congestion.   Yes Historical Provider, MD    Family History Family History  Problem Relation Age of Onset  . Brain cancer Maternal Grandmother     Social History Social History  Substance Use Topics  . Smoking status: Current Every Day Smoker    Packs/day:  0.30    Years: 6.00    Types: Cigarettes  . Smokeless tobacco: Never Used     Comment: ONE CIG. DAILY /  1 PPMONTH  . Alcohol use No     Allergies   Citric acid; Dust mite extract; Pollen extract; and Nickel   Review of Systems Review of Systems  Constitutional: Negative for chills and fever.  HENT: Negative for congestion and rhinorrhea.   Eyes: Negative for redness and visual disturbance.  Respiratory: Negative for shortness of breath and wheezing.   Cardiovascular: Negative for chest pain and palpitations.  Gastrointestinal: Positive for abdominal pain. Negative for nausea and vomiting.  Genitourinary: Negative for dysuria and urgency.  Musculoskeletal: Positive for arthralgias and myalgias.  Skin: Negative for pallor and wound.  Neurological: Negative for dizziness and headaches.     Physical Exam Updated Vital Signs BP 141/100 (BP Location: Left Arm)   Pulse 74   Temp 98.2 F (36.8 C) (Oral)   Resp 14   LMP 11/26/2016   SpO2 100%   Physical Exam  Constitutional: She is oriented to person, place, and time. She appears well-developed and well-nourished. No distress.  HENT:  Head: Normocephalic and atraumatic.  Eyes: EOM are normal. Pupils are equal, round, and reactive to light.  Neck: Normal range of motion. Neck supple.  Cardiovascular: Normal rate and regular rhythm.  Exam reveals no gallop and no friction rub.   No murmur heard. Pulmonary/Chest: Effort normal. She has no wheezes. She has no rales.  Abdominal: Soft. She exhibits no distension and no mass. There is tenderness (worst to the suprapubic region). There is no guarding.  Genitourinary:  Genitourinary Comments: Whitish thick discharge with mucousy discharge and friability at the cervix.  TTP with palpation of the uterus and +CMT.  R adnexal ttp without mass.   Musculoskeletal: She exhibits tenderness (TTP about the right SI joint.  -slr test bilaterally.  PMS intact distally). She exhibits no edema.    Neurological: She is alert and oriented to person, place, and time.  Skin: Skin is warm and dry. She is not diaphoretic.  Psychiatric: She has a normal mood and affect. Her behavior is normal.  Nursing note and vitals reviewed.    ED Treatments / Results  Labs (all labs ordered are listed, but only abnormal results are displayed) Labs Reviewed  WET PREP, GENITAL - Abnormal; Notable for the following:       Result Value   WBC, Wet Prep HPF POC MANY (*)    All other components within normal limits  URINALYSIS, ROUTINE W REFLEX MICROSCOPIC - Abnormal; Notable for the following:    APPearance HAZY (*)    All other components within normal limits  LIPASE, BLOOD  COMPREHENSIVE METABOLIC PANEL  CBC  PROTIME-INR  I-STAT  BETA HCG BLOOD, ED ([facility], [facility], [facility] ONLY)  GC/CHLAMYDIA PROBE AMP ([place]) NOT AT [facility]    EKG  EKG Interpretation None       Radiology US Transvaginal Non-ob  Result Date: 12/05/2016 CLINICAL DATA:  Pelvic pain for several days EXAM: TRANSABDOMINAL AND TRANSVAGINAL ULTRASOUND OF PELVIS DOPPLER ULTRASOUND OF OVARIES TECHNIQUE: Both transabdominal and transvaginal ultrasound examinations of the pelvis were performed. Transabdominal technique was performed for global imaging of the pelvis including uterus, ovaries, adnexal regions, and pelvic cul-de-sac. It was necessary to proceed with endovaginal exam following the transabdominal exam to visualize the ovaries. Color and duplex Doppler ultrasound was utilized to evaluate blood flow to the ovaries. COMPARISON:  06/22/2016 FINDINGS: Uterus Measurements: 9.3 x 5.6 x 4.8 cm. Multiple small fibroids are identified. The largest of these lies in the fundus measuring 3.9 cm. These are similar to that seen on prior CT examination. Endometrium Thickness: 6.6 mm.  No focal abnormality visualized. Right ovary Measurements: 3.6 x 2.5 x 2.4 cm. 1.9 cm cyst is noted within the right ovary. Left ovary Measurements: 2.9 x 1.5 x 1.6 cm.  Normal appearance/no adnexal mass. Pulsed Doppler evaluation of both ovaries demonstrates normal low-resistance arterial and venous waveforms. Other findings Small amount of free pelvic fluid is noted. This may be physiologic in nature. IMPRESSION: Multiple uterine fibroids. Right ovarian cyst. Minimal free fluid. Electronically Signed   By: Inez Catalina M.D.   On: 12/05/2016 18:41   US Pelvis Complete  Result Date: 12/05/2016 CLINICAL DATA:  Pelvic pain for several days EXAM: TRANSABDOMINAL AND TRANSVAGINAL ULTRASOUND OF PELVIS DOPPLER ULTRASOUND OF OVARIES TECHNIQUE: Both transabdominal and transvaginal ultrasound examinations of the pelvis were performed. Transabdominal technique was performed for global imaging of the pelvis including uterus, ovaries, adnexal regions, and pelvic cul-de-sac. It was necessary to proceed with endovaginal exam following the transabdominal exam to visualize the ovaries. Color and duplex Doppler ultrasound was utilized to evaluate blood flow to the ovaries. COMPARISON:  06/22/2016 FINDINGS: Uterus Measurements: 9.3 x 5.6 x 4.8 cm. Multiple small fibroids are identified. The largest of these lies in the fundus measuring 3.9 cm. These are similar to that seen on prior CT examination. Endometrium Thickness: 6.6 mm.  No focal abnormality visualized. Right ovary Measurements: 3.6 x 2.5 x 2.4 cm. 1.9 cm cyst is noted within the right ovary. Left ovary Measurements: 2.9 x 1.5 x 1.6 cm. Normal appearance/no adnexal mass. Pulsed Doppler evaluation of both ovaries demonstrates normal low-resistance arterial and venous waveforms. Other findings Small amount of free pelvic fluid is noted. This may be physiologic in nature. IMPRESSION: Multiple uterine fibroids. Right ovarian cyst. Minimal free fluid. Electronically Signed   By: Inez Catalina M.D.   On: 12/05/2016 18:41   [place] Art/ven Flow Abd Pelv Doppler  Result Date: 12/05/2016 CLINICAL DATA:  Pelvic pain for several days EXAM:  TRANSABDOMINAL AND TRANSVAGINAL ULTRASOUND OF PELVIS DOPPLER ULTRASOUND OF OVARIES TECHNIQUE: Both transabdominal and transvaginal ultrasound examinations of the pelvis were performed. Transabdominal technique was performed for global imaging of the pelvis including uterus, ovaries, adnexal regions, and pelvic cul-de-sac. It was necessary to proceed with endovaginal exam following the transabdominal exam to visualize the ovaries. Color and duplex Doppler ultrasound was utilized to evaluate blood flow to the ovaries. COMPARISON:  06/22/2016 FINDINGS: Uterus Measurements: 9.3 x 5.6 x 4.8 cm. Multiple small fibroids are identified. The largest of these lies in the fundus measuring 3.9 cm. These are similar to that seen on prior CT examination. Endometrium  Thickness: 6.6 mm.  No focal abnormality visualized. Right ovary Measurements: 3.6 x 2.5 x 2.4 cm. 1.9 cm cyst is noted within the right ovary. Left ovary Measurements: 2.9 x 1.5 x 1.6 cm. Normal appearance/no adnexal mass. Pulsed Doppler evaluation of both ovaries demonstrates normal low-resistance arterial and venous waveforms. Other findings Small amount of free pelvic fluid is noted. This may be physiologic in nature. IMPRESSION: Multiple uterine fibroids. Right ovarian cyst. Minimal free fluid. Electronically Signed   By: Inez Catalina M.D.   On: 12/05/2016 18:41    Procedures Procedures (including critical care time)  Medications Ordered in ED Medications  morphine 4 MG/ML injection 2 mg (2 mg Intravenous Not Given 12/05/16 1932)  ondansetron (ZOFRAN) injection 4 mg (4 mg Intravenous Not Given 12/05/16 1932)  sodium chloride 0.9 % bolus 1,000 mL (0 mLs Intravenous Stopped 12/05/16 1925)  ketorolac (TORADOL) 30 MG/ML injection 15 mg (15 mg Intravenous Given 12/05/16 1629)  acetaminophen (TYLENOL) tablet 1,000 mg (1,000 mg Oral Given 12/05/16 1628)  oxyCODONE (Oxy IR/ROXICODONE) immediate release tablet 5 mg (5 mg Oral Given 12/05/16 1628)  diazepam  (VALIUM) tablet 5 mg (5 mg Oral Given 12/05/16 1628)     Initial Impression / Assessment and Plan / ED Course  I have reviewed the triage vital signs and the nursing notes.  Pertinent labs & imaging results that were available during my care of the patient were reviewed by me and considered in my medical decision making (see chart for details).  Clinical Course     40 yo F With a chief complaint of right-sided low back pain. This reproduced with palpation of the SI joint area on that side. Patient doesn't say that it's worse with movement or when she moves it in the room she grimaces. She also is complaining of some suprapubic tenderness. She has a history of interstitial cystitis and thinks this may feel the same though never had a low back pain with it. I suspect these are 2 different etiologies. Labs are reassuring. We'll obtain a pelvic exam.  Exam concerning with right adnexal tenderness. Patient also with significant discharge and friability to the cervix. I discussed in her treatment with antibiotics for sex and transmitted diseases. Patient states that she has not had sex in quite some time and adamantly denies risk of STD. She is currently declining antibiotic therapy. Obtain pelvic US.   Pelvic ultrasound with no significant abnormalities. UA negative. I discussed results with the patient and suggested that she do Tylenol and ibuprofen around-the-clock and follow-up with her family physician. Patient became angry that I was not to prescribe her narcotics. She stated that she came here for nothing. I told her that I evaluated her for emergent medical condition and I suggested that she follow-up with her family physician. I discussed that prescription pain medicines would not improve her low back pain and that has been proven research studies.  7:37 PM:  I have discussed the diagnosis/risks/treatment options with the patient and believe the pt to be eligible for discharge home to follow-up  with PCP. We also discussed returning to the ED immediately if new or worsening sx occur. We discussed the sx which are most concerning (e.g., sudden worsening pain, fever, inability to tolerate by mouth) that necessitate immediate return. Medications administered to the patient during their visit and any new prescriptions provided to the patient are listed below.  Medications given during this visit Medications  morphine 4 MG/ML injection 2 mg (2 mg Intravenous Not  Given 12/05/16 1932)  ondansetron (ZOFRAN) injection 4 mg (4 mg Intravenous Not Given 12/05/16 1932)  sodium chloride 0.9 % bolus 1,000 mL (0 mLs Intravenous Stopped 12/05/16 1925)  ketorolac (TORADOL) 30 MG/ML injection 15 mg (15 mg Intravenous Given 12/05/16 1629)  acetaminophen (TYLENOL) tablet 1,000 mg (1,000 mg Oral Given 12/05/16 1628)  oxyCODONE (Oxy IR/ROXICODONE) immediate release tablet 5 mg (5 mg Oral Given 12/05/16 1628)  diazepam (VALIUM) tablet 5 mg (5 mg Oral Given 12/05/16 1628)     The patient appears reasonably screen and/or stabilized for discharge and I doubt any other medical condition or other [facility] requiring further screening, evaluation, or treatment in the ED at this time prior to discharge.    Final Clinical Impressions(s) / ED Diagnoses   Final diagnoses:  Ovarian torsion  Acute right-sided low back pain with right-sided sciatica  Suprapubic pain    New Prescriptions Discharge Medication List as of 12/05/2016  7:02 PM       Deno Etienne, DO 12/05/16 1937

## 2016-12-06 ENCOUNTER — Encounter: Payer: Self-pay | Admitting: Family Medicine

## 2016-12-06 ENCOUNTER — Other Ambulatory Visit: Payer: Self-pay | Admitting: Family Medicine

## 2016-12-06 ENCOUNTER — Ambulatory Visit (INDEPENDENT_AMBULATORY_CARE_PROVIDER_SITE_OTHER): Payer: Medicare Other | Admitting: Family Medicine

## 2016-12-06 VITALS — BP 129/88 | HR 76 | Temp 98.2°F | Resp 16 | Ht 66.0 in | Wt 202.0 lb

## 2016-12-06 DIAGNOSIS — N83209 Unspecified ovarian cyst, unspecified side: Secondary | ICD-10-CM | POA: Insufficient documentation

## 2016-12-06 DIAGNOSIS — M545 Low back pain, unspecified: Secondary | ICD-10-CM

## 2016-12-06 DIAGNOSIS — R102 Pelvic and perineal pain: Secondary | ICD-10-CM

## 2016-12-06 DIAGNOSIS — N920 Excessive and frequent menstruation with regular cycle: Secondary | ICD-10-CM | POA: Insufficient documentation

## 2016-12-06 DIAGNOSIS — D259 Leiomyoma of uterus, unspecified: Secondary | ICD-10-CM

## 2016-12-06 DIAGNOSIS — N83201 Unspecified ovarian cyst, right side: Secondary | ICD-10-CM

## 2016-12-06 MED ORDER — KETOROLAC TROMETHAMINE 60 MG/2ML IM SOLN
60.0000 mg | Freq: Once | INTRAMUSCULAR | Status: AC
  Administered 2016-12-06: 60 mg via INTRAMUSCULAR

## 2016-12-06 MED ORDER — IBUPROFEN 600 MG PO TABS: 600.0000 mg | ORAL_TABLET | Freq: Three times a day (TID) | ORAL | 0 refills | Status: DC | PRN

## 2016-12-06 MED ORDER — TRAMADOL HCL 50 MG PO TABS: 50.0000 mg | ORAL_TABLET | Freq: Three times a day (TID) | ORAL | 0 refills | Status: DC | PRN

## 2016-12-06 NOTE — Progress Notes (Signed)
Subjective:    Patient ID: Rhonda Hester, female    DOB: 1976-03-27, 40 y.o.   MRN: [id number] Ms. Vala, a 40 year old female with a history of sarcodoisis and interstitial cystitis presents complaining of right hip pain and pelvic pain. She was evaluated in the emergency department on 12/05/2016. She had a transvaginal ultrasound, which shows multiple uterine fibroids and a cyst to right ovary. She states that she is currently having 10/10 pain to pelvic area, unrelieved by Ibuprofen Ms. Shinnick is followed by Dr. Chesley Mires, [facility] for sarcoidosis. Symptoms are controlled on Dulera daily. She is also followed by [facility] for interstitial cystitis. She says that she continues to have bladder spasms. She reports occasional dysuria. She denies fatigue, fever, chest pains, urinary frequency, urgency, or constipation.  Pelvic Pain  The patient's primary symptoms include pelvic pain. The patient's pertinent negatives include no genital itching, genital lesions, genital odor, vaginal bleeding or vaginal discharge. This is a recurrent problem. The current episode started 1 to 4 weeks ago. The problem occurs constantly. The problem has been rapidly worsening. The pain is severe. The problem affects the right side. Associated symptoms include abdominal pain, back pain, joint pain and rash (facial acne). Pertinent negatives include no chills, constipation, diarrhea, dysuria, fever, flank pain, frequency, headaches, hematuria, joint swelling, nausea, sore throat, urgency or vomiting. She has tried heating pads and NSAIDs for the symptoms. The treatment provided no relief. She is not sexually active. She uses nothing for contraception. Her menstrual history has been irregular. There is no history of an abdominal surgery, a Cesarean section, an ectopic pregnancy, endometriosis, a gynecological surgery, herpes simplex, menorrhagia, metrorrhagia, miscarriage, ovarian cysts, perineal  abscess, PID, an STD, a terminated pregnancy or vaginosis.  Hip Pain   The pain is at a severity of 8/10. The pain is moderate. The pain has been intermittent since onset. Pertinent negatives include no inability to bear weight, loss of motion, loss of sensation, muscle weakness, numbness or tingling. She has tried NSAIDs for the symptoms. The treatment provided mild relief.    Past Medical History:  Diagnosis Date  . Allergic asthma   . Eczema   . GERD (gastroesophageal reflux disease)   . History of anal fissures   . History of Bell's palsy    2006  RIGHT SIDE--  RESOLVED  . History of gastroesophageal reflux (GERD)   . Interstitial cystitis   . Sarcoidosis of lung ([place])    DX 2006--  PULMOLOGIST--  DR [id number]  . Seasonal allergic rhinitis   . Shortness of breath    Immunization History  Administered Date(s) Administered  . Influenza Whole 09/02/2011, 09/26/2012  . Influenza,inj,Quad PF,36+ Mos 12/27/2012, 09/04/2015  . Pneumococcal Polysaccharide-23 05/10/2016  . Tdap 05/10/2016   Social History   Social History  . Marital status: Single    Spouse name: N/A  . Number of children: N/A  . Years of education: N/A   Occupational History  . Not on file.   Social History Main Topics  . Smoking status: Current Every Day Smoker    Packs/day: 0.30    Years: 6.00    Types: Cigarettes  . Smokeless tobacco: Never Used     Comment: ONE CIG. DAILY /  1 PPMONTH  . Alcohol use No  . Drug use: No     Comment: HX MARIJUANA USE  . Sexual activity: Not on file   Other Topics Concern  . Not on file   Social History  Narrative  . No narrative on file   Review of Systems  Constitutional: Positive for unexpected weight change (weight gain). Negative for chills, fatigue and fever.  HENT: Negative.  Negative for sore throat.   Eyes: Negative.   Respiratory: Negative.  Negative for shortness of breath.   Cardiovascular: Negative.  Negative for chest pain, palpitations and leg  swelling.  Gastrointestinal: Positive for abdominal pain. Negative for constipation, diarrhea, nausea and vomiting.  Endocrine: Negative.  Negative for polydipsia, polyphagia and polyuria.  Genitourinary: Positive for pelvic pain. Negative for dysuria, flank pain, frequency, hematuria, menorrhagia, urgency and vaginal discharge.  Musculoskeletal: Positive for back pain and joint pain.  Skin: Positive for rash (facial acne).  Allergic/Immunologic: Negative.   Neurological: Negative.  Negative for tingling, numbness and headaches.  Hematological: Negative.   Psychiatric/Behavioral: Negative.  Negative for sleep disturbance and suicidal ideas.       Objective:   Physical Exam  Constitutional: She is oriented to person, place, and time. She appears well-developed and well-nourished.  HENT:  Head: Normocephalic and atraumatic.  Right Ear: External ear normal.  Left Ear: External ear normal.  Mouth/Throat: Oropharynx is clear and moist.  Eyes: Conjunctivae and EOM are normal. Pupils are equal, round, and reactive to light.  Neck: Normal range of motion. Neck supple.  Pulmonary/Chest: Effort normal and breath sounds normal.  Abdominal: Soft. Bowel sounds are normal. She exhibits distension. There is tenderness in the right lower quadrant and left lower quadrant.  Musculoskeletal: Normal range of motion.       Lumbar back: She exhibits tenderness and pain.  Neurological: She is alert and oriented to person, place, and time. She has normal reflexes.  Skin: Skin is warm and dry.  Psychiatric: She has a normal mood and affect. Her behavior is normal. Judgment and thought content normal.      BP 129/88 (BP Location: Right Arm, Patient Position: Sitting, Cuff Size: Normal)   Pulse 76   Temp 98.2 F (36.8 C) (Oral)   Resp 16   Ht 5\' 6"  (1.676 m)   Wt 202 lb (91.6 kg)   LMP 11/26/2016   SpO2 98%   BMI 32.60 kg/m  Assessment & Plan:  1. Cyst of right ovary Transbaginal ultrasound shows a  1.9 cm cyst. Will defer to gynecology for further work up - Ambulatory referral to Gynecology  2. Menorrhagia with regular cycle  - Ambulatory referral to Gynecology  3. Pelvic pain in female Reviewed transvaginal ultrasound. Small amount of free pelvic fluid is noted. Also multiple uterine fibroids. Will give Tramadol # 30 and defer to gynecology for further work-up - traMADol (ULTRAM) 50 MG tablet; Take 1 tablet (50 mg total) by mouth every 8 (eight) hours as needed.  Dispense: 30 tablet; Refill: 0  4. Low back pain without sciatica, unspecified back pain laterality, unspecified chronicity - ibuprofen (ADVIL,MOTRIN) 600 MG tablet; Take 1 tablet (600 mg total) by mouth every 8 (eight) hours as needed.  Dispense: 30 tablet; Refill: 0 - ketorolac (TORADOL) injection 60 mg; Inject 2 mLs (60 mg total) into the muscle once.  5. Uterine leiomyoma, unspecified location Multiple uterine fibroids per transvaginal ultrasound - Ambulatory referral to Gynecology   RTC: To scheduled follow up following gynecology evaluation Dorena Dew, FNP

## 2016-12-07 ENCOUNTER — Other Ambulatory Visit: Payer: Self-pay | Admitting: Family Medicine

## 2016-12-07 DIAGNOSIS — N644 Mastodynia: Secondary | ICD-10-CM

## 2016-12-07 LAB — GC/CHLAMYDIA PROBE AMP (~~LOC~~) NOT AT ARMC
Chlamydia: NEGATIVE
Neisseria Gonorrhea: NEGATIVE

## 2016-12-22 ENCOUNTER — Telehealth: Payer: Self-pay

## 2016-12-22 ENCOUNTER — Ambulatory Visit
Admission: RE | Admit: 2016-12-22 | Discharge: 2016-12-22 | Disposition: A | Discharge status: Home / Self Care | Payer: Medicare Other | Source: Ambulatory Visit | Attending: Family Medicine | Admitting: Family Medicine

## 2016-12-22 DIAGNOSIS — N644 Mastodynia: Secondary | ICD-10-CM

## 2016-12-22 DIAGNOSIS — R102 Pelvic and perineal pain: Secondary | ICD-10-CM

## 2016-12-23 ENCOUNTER — Other Ambulatory Visit: Payer: Self-pay

## 2016-12-23 DIAGNOSIS — M545 Low back pain, unspecified: Secondary | ICD-10-CM

## 2016-12-23 MED ORDER — TRAMADOL HCL 50 MG PO TABS: 50.0000 mg | ORAL_TABLET | Freq: Three times a day (TID) | ORAL | 0 refills | Status: DC | PRN

## 2016-12-23 MED ORDER — IBUPROFEN 600 MG PO TABS: 600.0000 mg | ORAL_TABLET | Freq: Three times a day (TID) | ORAL | 0 refills | Status: DC | PRN

## 2016-12-23 NOTE — Telephone Encounter (Signed)
Reviewed [place] Substance Reporting system prior to prescribing opiate medications, no inconsistencies noted.   Meds ordered this encounter  Medications  . traMADol (ULTRAM) 50 MG tablet    Sig: Take 1 tablet (50 mg total) by mouth every 8 (eight) hours as needed.    Dispense:  30 tablet    Refill:  0    Order Specific Question:   Supervising Provider    Answer:   Tresa Garter [id number]    Dorena Dew, FNP

## 2016-12-23 NOTE — Telephone Encounter (Signed)
Patient is requesting a refill on Tramadol, lov was 12/06/2016

## 2016-12-23 NOTE — Telephone Encounter (Signed)
Refill for ibuprofen sent into pharmacy. Thanks!  

## 2017-01-17 ENCOUNTER — Other Ambulatory Visit: Payer: Self-pay | Admitting: *Deleted

## 2017-01-17 ENCOUNTER — Encounter: Payer: Self-pay | Admitting: Obstetrics and Gynecology

## 2017-01-17 ENCOUNTER — Ambulatory Visit (INDEPENDENT_AMBULATORY_CARE_PROVIDER_SITE_OTHER): Payer: Medicare Other | Admitting: Obstetrics and Gynecology

## 2017-01-17 VITALS — BP 129/87 | HR 65 | Ht 66.0 in | Wt 207.8 lb

## 2017-01-17 DIAGNOSIS — N3010 Interstitial cystitis (chronic) without hematuria: Secondary | ICD-10-CM

## 2017-01-17 DIAGNOSIS — D251 Intramural leiomyoma of uterus: Secondary | ICD-10-CM

## 2017-01-17 DIAGNOSIS — D259 Leiomyoma of uterus, unspecified: Secondary | ICD-10-CM | POA: Insufficient documentation

## 2017-01-17 DIAGNOSIS — N920 Excessive and frequent menstruation with regular cycle: Secondary | ICD-10-CM | POA: Diagnosis not present

## 2017-01-17 MED ORDER — URIBEL 118 MG PO CAPS: 1.0000 | ORAL_CAPSULE | Freq: Three times a day (TID) | ORAL | 2 refills | Status: DC

## 2017-01-17 MED ORDER — PENTOSAN POLYSULFATE SODIUM 100 MG PO CAPS: 100.0000 mg | ORAL_CAPSULE | Freq: Three times a day (TID) | ORAL | 3 refills | Status: DC

## 2017-01-17 NOTE — Progress Notes (Signed)
Patient is in the office in relation to pelvis pain for about 3 months, pain is worse at night and when she is menstruating.

## 2017-01-17 NOTE — Patient Instructions (Signed)
Interstitial Cystitis Introduction Interstitial cystitis is a condition that causes inflammation of the bladder. The bladder is a hollow organ in the lower part of your abdomen. It stores urine after the urine is made by your kidneys. With interstitial cystitis, you may have pain in the bladder area. You may also have a frequent and urgent need to urinate. The severity of interstitial cystitis can vary from person to person. You may have flare-ups of the condition, and then it may go away for a while. For many people who have this condition, it becomes a long-term problem. What are the causes? The cause of this condition is not known. What increases the risk? This condition is more likely to develop in women. What are the signs or symptoms? Symptoms of interstitial cystitis vary, and they can change over time. Symptoms may include:  Discomfort or pain in the bladder area. This can range from mild to severe. The pain may change in intensity as the bladder fills with urine or as it empties.  Pelvic pain.  An urgent need to urinate.  Frequent urination.  Pain during sexual intercourse.  Pinpoint bleeding on the bladder wall. For women, the symptoms often get worse during menstruation. How is this diagnosed? This condition is diagnosed by evaluating your symptoms and ruling out other causes. A physical exam will be done. Various tests may be done to rule out other conditions. Common tests include:  Urine tests.  Cystoscopy. In this test, a tool that is like a very thin telescope is used to look into your bladder.  Biopsy. This involves taking a sample of tissue from the bladder wall to be examined under a microscope. How is this treated? There is no cure for interstitial cystitis, but treatment methods are available to control your symptoms. Work closely with your health care provider to find the treatments that will be most effective for you. Treatment options may include:  Medicines  to relieve pain and to help reduce the number of times that you feel the need to urinate.  Bladder training. This involves learning ways to control when you urinate, such as:  Urinating at scheduled times.  Training yourself to delay urination.  Doing exercises (Kegel exercises) to strengthen the muscles that control urine flow.  Lifestyle changes, such as changing your diet or taking steps to control stress.  Use of a device that provides electrical stimulation in order to reduce pain.  A procedure that stretches your bladder by filling it with air or fluid.  Surgery. This is rare. It is only done for extreme cases if other treatments do not help. Follow these instructions at home:  Take medicines only as directed by your health care provider.  Use bladder training techniques as directed.  Keep a bladder diary to find out which foods, liquids, or activities make your symptoms worse.  Use your bladder diary to schedule bathroom trips. If you are away from home, plan to be near a bathroom at each of your scheduled times.  Make sure you urinate just before you leave the house and just before you go to bed.  Do Kegel exercises as directed by your health care provider.  Do not drink alcohol.  Do not use any tobacco products, including cigarettes, chewing tobacco, or electronic cigarettes. If you need help quitting, ask your health care provider.  Make dietary changes as directed by your health care provider. You may need to avoid spicy foods and foods that contain a high amount of potassium.    Limit your drinking of beverages that stimulate urination. These include soda, coffee, and tea.  Keep all follow-up visits as directed by your health care provider. This is important. Contact a health care provider if:  Your symptoms do not get better after treatment.  Your pain and discomfort are getting worse.  You have more frequent urges to urinate.  You have a fever. Get help  right away if:  You are not able to control your bladder at all. This information is not intended to replace advice given to you by your health care provider. Make sure you discuss any questions you have with your health care provider. Document Released: 08/13/2004 Document Revised: 05/20/2016 Document Reviewed: 08/20/2014  2017 Elsevier  

## 2017-01-17 NOTE — Progress Notes (Signed)
Patient ID: Rhonda Hester, female   DOB: 02/29/1976, 41 y.o.   MRN: [id number]   Ms Carrier presents for eval of uterine fibroids and right ovarian cyst. Pt with known fibroids by CT scan last May/June. Presented to ER mid December with c/o pain in back, hip and pelvis. U/S confirmed fibroids, largest 3.9 cm and 1.9 cm right ovarian cyst.  Pt reports the her pain is better but still has pain particularly when she awakens in the morning. LMP 12/17/16. Cycles monthly, last 3-4 days, heavy with cramps, changes pads q1hr on days 1-2 and then tapers off Not sexual active. No contraception  H/O IC but has not seen urology in over a year. No medication but has had bladder instillation in the past. Tries to follow IC diet.  H/O sarcoidosis and followed by pulmonary  POB [id number]  PE AF VSS Lungs clear Heart RRR Abd soft + BS Pelvic bladder non tender, uterus 8-10 wks size, mobile, non tender, no adnexal masses or tenderness  A/P Uterine Fibroids        Ovarian Cyst        Menorrhagia        IC  I doubt pt's pain is related to her fibroids and cyst ( physiological in nature).  Fibroids may be contributing to pt's cycle Sx. Suspect pt's pain is related to her Ic. Discussed IC and its relationship to pelvic pain. Discussed trying Uribel and Elmiron. Pt is agreeable. Pt also provided information on IC and instructed to visit IC site for additional information and support group location. F/U in 2 months.

## 2017-01-17 NOTE — Progress Notes (Signed)
Change in Uribel quantity due to coverage.

## 2017-01-24 ENCOUNTER — Ambulatory Visit (INDEPENDENT_AMBULATORY_CARE_PROVIDER_SITE_OTHER): Payer: Medicare Other | Admitting: Acute Care

## 2017-01-24 ENCOUNTER — Encounter: Payer: Self-pay | Admitting: Acute Care

## 2017-01-24 DIAGNOSIS — D860 Sarcoidosis of lung: Secondary | ICD-10-CM | POA: Diagnosis not present

## 2017-01-24 DIAGNOSIS — Z720 Tobacco use: Secondary | ICD-10-CM | POA: Insufficient documentation

## 2017-01-24 DIAGNOSIS — F17210 Nicotine dependence, cigarettes, uncomplicated: Secondary | ICD-10-CM | POA: Diagnosis not present

## 2017-01-24 DIAGNOSIS — J45909 Unspecified asthma, uncomplicated: Secondary | ICD-10-CM

## 2017-01-24 NOTE — Assessment & Plan Note (Signed)
Stable interval Plan Continue Dulera daily Pro Air rescue inhaler as needed for shortness of breath or wheezing 

## 2017-01-24 NOTE — Patient Instructions (Addendum)
It is good to see you today. I am very proud of you for quitting smoking. Continue your Dulera two puffs twice daily as you have been doing. Use your pro Air rescue inhaler for shortness of breath or wheezing up to every 6 hours as needed. Ok to start exercising again, start slow and work your way up. If you become short of breath stop. Referral to dietitian. Follow up with Dr. Halford Chessman or Eric Form in 3 months Please contact office for sooner follow up if symptoms do not improve or worsen or seek emergency care

## 2017-01-24 NOTE — Progress Notes (Signed)
History of Present Illness Rhonda Hester is a 41 y.o. female current smoker with pulmonary sarcoidosis and chronic asthma. She is followed by Dr. Halford Chessman.   01/24/2017 Follow up: Pt. Returns for follow up. She has successfully weaned herself off the prednisone. She states that she has had no further prednisone in the last month. She cannot handle the weight gain. It makes her very anxious. She has quit smoking since the last time she was here. She states she rarely has a cough, and that her shortness of breath is better. I have told her how proud I am of her.She is compliant with her [facility] and ProAir inhaler. She did go and see the rheumatoidologist. She does not feel the her body aches are due to sarcoid. She has been started on Ibuprofen 600 mg three times daily as needed. She denies any chest pain, othopnea, fever or hemoptysis.She states she would like to start exercising again, as she feels it helps prevent her from getting depressed.  I spent 3 minutes counseling on smoking cessation and abstinence.  Tests  Pulmonary tests RAST 07/22/11 >> Multiple allergens, IgE 71.8  PFT 09/02/11 >> FEV1 2.80(93%), FEV1% 82, TLC 4.74(89%), DLCO 59%, no BD PFT 09/25/13 >> FEV1 2.75 (95%), FEV1% 81, TLC 4.25 (77%), DLCO 64%, no BD ACE 09/24/16 >> 219 PFT 11/24/16 >> FEV1 2.52 (89%), FEV1% 80, TLC 4.65 (84%), DLCO 56%, no BD  CT chests CT chest 12/30/04 >> b/l hilar and mediastinal adenopathy with b/l nodular interstitial opacities  CT chest 07/08/08 >> decreased lymph nodes, scarring with volume loss  CT chest 11/20/13 >>  Scattered areas of ATX CT chest 04/02/14 >> calcified Rt hilar LAN, scattered areas of scarring CT chest 10/01/16 >> stable scarring and traction BTX  Past medical history GERD  Past medical hx Past Medical History:  Diagnosis Date  . Allergic asthma   . Eczema   . GERD (gastroesophageal reflux disease)   . History of anal fissures   . History of Bell's palsy    2006   RIGHT SIDE--  RESOLVED  . History of gastroesophageal reflux (GERD)   . Interstitial cystitis   . Sarcoidosis of lung ([place])    DX 2006--  PULMOLOGIST--  DR [id number]  . Seasonal allergic rhinitis   . Shortness of breath      Past surgical hx, Family hx, Social hx all reviewed.  Current Outpatient Prescriptions on File Prior to Visit  Medication Sig  . albuterol (PROAIR HFA) 108 (90 BASE) MCG/ACT inhaler Inhale 2 puffs into the lungs every 6 (six) hours as needed for shortness of breath.  Marland Kitchen ibuprofen (ADVIL,MOTRIN) 600 MG tablet Take 1 tablet (600 mg total) by mouth every 8 (eight) hours as needed.  . LUTEIN PO Take 1 tablet by mouth daily.  . Meth-Hyo-M Bl-Na Phos-Ph Sal (URIBEL) 118 MG CAPS Take 1 capsule (118 mg total) by mouth 3 (three) times daily.  . mometasone (NASONEX) 50 MCG/ACT nasal spray Place 2 sprays into the nose daily.  . mometasone-formoterol (DULERA) 100-5 MCG/ACT AERO Inhale 2 puffs into the lungs 2 (two) times daily.  . Multiple Vitamin (MULTIVITAMIN WITH MINERALS) TABS tablet Take 1 tablet by mouth daily.  . Multiple Vitamins-Minerals (BODY/HAIR/SKIN/NAILS PO) Take 1 tablet by mouth daily.  . Omega-3 Fatty Acids (FISH OIL) 1000 MG CAPS Take 1,000 mg by mouth daily.  . pentosan polysulfate (ELMIRON) 100 MG capsule Take 1 capsule (100 mg total) by mouth 3 (three) times daily.  . predniSONE (DELTASONE)  10 MG tablet 2 pills daily for 2 weeks, then 1 pill daily  . sodium chloride (OCEAN) 0.65 % SOLN nasal spray Place 1 spray into both nostrils as needed for congestion.  . traMADol (ULTRAM) 50 MG tablet Take 1 tablet (50 mg total) by mouth every 8 (eight) hours as needed.   Current Facility-Administered Medications on File Prior to Visit  Medication  . bupivacaine (MARCAINE) 0.5 % 15 mL, phenazopyridine (PYRIDIUM) 400 mg bladder mixture     Allergies  Allergen Reactions  . Citric Acid Other (See Comments)    AVOIDS DUE TO INTERSTITIAL CYSTITIS  . Dust Mite Extract Itching  and Other (See Comments)     coughing  . Pollen Extract Itching and Other (See Comments)     coughing  . Nickel Rash    Review Of Systems:  Constitutional:   No  weight loss, night sweats,  Fevers, chills, fatigue, or  lassitude.  HEENT:   No headaches,  Difficulty swallowing,  Tooth/dental problems, or  Sore throat,                No sneezing, itching, ear ache, nasal congestion, post nasal drip,   CV:  No chest pain,  Orthopnea, PND, swelling in lower extremities, anasarca, dizziness, palpitations, syncope.   GI  No heartburn, indigestion, abdominal pain, nausea, vomiting, diarrhea, change in bowel habits, loss of appetite, bloody stools.   Resp: less shortness of breath with exertion none at rest.  No excess mucus, no productive cough,  + non-productive cough,  No coughing up of blood.  No change in color of mucus.  Occasional  wheezing.  No chest wall deformity  Skin: no rash or lesions.  GU: no dysuria, change in color of urine, no urgency or frequency.  No flank pain, no hematuria   MS:  No joint pain or swelling.  No decreased range of motion.  No back pain.  Psych:  No change in mood or affect. sligh depression or anxiety.  No memory loss.   Vital Signs BP 120/66 (BP Location: Left Arm, Cuff Size: Normal)   Pulse 61   Ht 5\' 7"  (1.702 m)   Wt 207 lb 3.2 oz (94 kg)   SpO2 98%   BMI 32.45 kg/m    Physical Exam:  General- No distress,  A&Ox3, pleasant ENT: No sinus tenderness, TM clear, pale nasal mucosa, no oral exudate,no post nasal drip, no LAN Cardiac: S1, S2, regular rate and rhythm, no murmur Chest: No wheeze/ rales/ dullness; no accessory muscle use, no nasal flaring, no sternal retractions Abd.: Soft Non-tender Ext: No clubbing cyanosis, edema Neuro:  normal strength Skin: No rashes, warm and dry Psych: normal mood and behavior   Assessment/Plan  Sarcoidosis of lung Continued improvement after weaning off prednisone. 7 lb weight gain on  prednisone Plan: Continue your Dulera two puffs twice daily as you have been doing. Use your pro Air rescue inhaler for shortness of breath or wheezing up to every 6 hours as needed. Ok to start exercising again, start slow and work your way up. If you become short of breath stop. Referral to dietitian. Follow up with Dr. Halford Chessman or Eric Form in 3 months Please contact office for sooner follow up if symptoms do not improve or worsen or seek emergency care     Allergic asthma Stable interval Plan: Continue Dulera daily. Pro Air rescue inhaler as needed for shortness of breath or wheezing.  Tobacco abuse Pt. States she has quit smoking  since last visit. Plan: Encouraged patient to remain smoke free. Congratulated her on her success.    Magdalen Spatz, NP 01/24/2017  10:59 AM

## 2017-01-24 NOTE — Assessment & Plan Note (Signed)
Pt. States she has quit smoking since last visit. Plan: Encouraged patient to remain smoke free. Congratulated her on her success.

## 2017-01-24 NOTE — Assessment & Plan Note (Signed)
Continued improvement after weaning off prednisone. 7 lb weight gain on prednisone Plan: Continue your Dulera two puffs twice daily as you have been doing. Use your pro Air rescue inhaler for shortness of breath or wheezing up to every 6 hours as needed. Ok to start exercising again, start slow and work your way up. If you become short of breath stop. Referral to dietitian. Follow up with Dr. Halford Chessman or Eric Form in 3 months Please contact office for sooner follow up if symptoms do not improve or worsen or seek emergency care

## 2017-01-25 NOTE — Progress Notes (Signed)
I have reviewed and agree with assessment/plan.  Chesley Mires, MD [facility] Pulmonary/Critical Care 01/25/2017, 10:08 AM Pager:  [phone]

## 2017-02-11 ENCOUNTER — Telehealth: Payer: Self-pay

## 2017-02-11 NOTE — Telephone Encounter (Signed)
Patient called in stating that rx samples for uribel that she received were expired. Advised patient that we have more in the office and stated that she would pick them up on Monday.

## 2017-03-10 ENCOUNTER — Encounter (HOSPITAL_COMMUNITY): Payer: Self-pay | Admitting: Emergency Medicine

## 2017-03-10 ENCOUNTER — Ambulatory Visit (HOSPITAL_COMMUNITY)
Admission: EM | Admit: 2017-03-10 | Discharge: 2017-03-10 | Disposition: A | Discharge status: Home / Self Care | Payer: Medicare Other

## 2017-03-10 DIAGNOSIS — B028 Zoster with other complications: Secondary | ICD-10-CM | POA: Diagnosis not present

## 2017-03-10 MED ORDER — NAPROXEN 500 MG PO TABS: 500.0000 mg | ORAL_TABLET | Freq: Two times a day (BID) | ORAL | 0 refills | Status: DC

## 2017-03-10 MED ORDER — ACYCLOVIR 400 MG PO TABS: 400.0000 mg | ORAL_TABLET | Freq: Four times a day (QID) | ORAL | 0 refills | Status: DC

## 2017-03-10 MED ORDER — LIDOCAINE 5 % EX PTCH: 1.0000 | MEDICATED_PATCH | CUTANEOUS | 0 refills | Status: DC

## 2017-03-10 MED ORDER — KETOROLAC TROMETHAMINE 30 MG/ML IJ SOLN
INTRAMUSCULAR | Status: AC
  Filled 2017-03-10: qty 1

## 2017-03-10 MED ORDER — KETOROLAC TROMETHAMINE 30 MG/ML IJ SOLN
30.0000 mg | Freq: Once | INTRAMUSCULAR | Status: AC
  Administered 2017-03-10: 30 mg via INTRAMUSCULAR

## 2017-03-10 NOTE — ED Triage Notes (Signed)
Pt has one spot on her left side on her rib cage that has a lot of throbbing pain.  The pain is reproducible.

## 2017-03-10 NOTE — ED Provider Notes (Signed)
CSN: [id number]     Arrival date & time 03/10/17  1537 History   First MD Initiated Contact with Patient 03/10/17 1658     Chief Complaint  Patient presents with  . Chest Pain   (Consider location/radiation/quality/duration/timing/severity/associated sxs/prior Treatment) Pt c/o LT upper shoulder blade pain that radiates to LT breast states this pain feels like shingles pain she had 1 yr ago on the other side. Pain is more with rebound pressure. Denies any chest pain, no sob, no hemoptysis.denies any viral sx.  Took motrin with no relief. Pt pacing the room in discomfort. Pt did not want to be placed back on a steriods.       Past Medical History:  Diagnosis Date  . Allergic asthma   . Eczema   . GERD (gastroesophageal reflux disease)   . History of anal fissures   . History of Bell's palsy    2006  RIGHT SIDE--  RESOLVED  . History of gastroesophageal reflux (GERD)   . Interstitial cystitis   . Sarcoidosis of lung ([employer])    DX 2006--  PULMOLOGIST--  DR QION  . Seasonal allergic rhinitis   . Shortness of breath    Past Surgical History:  Procedure Laterality Date  . CYSTO WITH HYDRODISTENSION N/A 08/24/2013   Procedure: CYSTOSCOPY/HYDRODISTENSION with instillation of marcaine and pyridium;  Surgeon: Fredricka Bonine, MD;  Location: [facility];  Service: Urology;  Laterality: N/A;  . CYSTOSCOPY/ HYDRODISTENTION/ BLADDER BX  06-09-2010  . DILATION AND CURETTAGE OF UTERUS  1997   Family History  Problem Relation Age of Onset  . Brain cancer Maternal Grandmother    Social History  Substance Use Topics  . Smoking status: Former Smoker    Packs/day: 0.30    Years: 6.00    Types: Cigarettes, Cigars    Quit date: 11/2016  . Smokeless tobacco: Never Used     Comment: ONE CIG. DAILY /  1 PPMONTH  . Alcohol use No   OB History    Gravida Para Term Preterm AB Living   1 0 0 0 1 0   SAB TAB Ectopic Multiple Live Births   1 0 0 0       Review of Systems   Constitutional: Negative.   HENT: Negative.   Eyes: Negative.   Respiratory: Negative.   Cardiovascular: Negative.   Gastrointestinal: Negative.   Genitourinary: Negative.   Skin: Positive for rash.       Red rash area on LT shoulder blade,   Neurological:       Tingling pain to lt shoulder blade radiates to lt breast. No breast discharge.     Allergies  Citric acid; Dust mite extract; Pollen extract; and Nickel  Home Medications   Prior to Admission medications   Medication Sig Start Date End Date Taking? Authorizing Provider  ibuprofen (ADVIL,MOTRIN) 800 MG tablet Take 800 mg by mouth every 8 (eight) hours as needed.   Yes Historical Provider, MD  acyclovir (ZOVIRAX) 400 MG tablet Take 1 tablet (400 mg total) by mouth 4 (four) times daily. 03/10/17   Melanee Left, NP  albuterol (PROAIR HFA) 108 (90 BASE) MCG/ACT inhaler Inhale 2 puffs into the lungs every 6 (six) hours as needed for shortness of breath. 05/15/15   Chesley Mires, MD  ibuprofen (ADVIL,MOTRIN) 600 MG tablet Take 1 tablet (600 mg total) by mouth every 8 (eight) hours as needed. 12/23/16   Dorena Dew, FNP  lidocaine (LIDODERM) 5 % Place  1 patch onto the skin daily. Remove & Discard patch within 12 hours or as directed by MD 03/10/17   Melanee Left, NP  LUTEIN PO Take 1 tablet by mouth daily.    Historical Provider, MD  Meth-Hyo-M Bl-Na Phos-Ph Sal (URIBEL) 118 MG CAPS Take 1 capsule (118 mg total) by mouth 3 (three) times daily. 01/17/17   Chancy Milroy, MD  mometasone (NASONEX) 50 MCG/ACT nasal spray Place 2 sprays into the nose daily.    Historical Provider, MD  mometasone-formoterol (DULERA) 100-5 MCG/ACT AERO Inhale 2 puffs into the lungs 2 (two) times daily. 05/15/15   Chesley Mires, MD  Multiple Vitamin (MULTIVITAMIN WITH MINERALS) TABS tablet Take 1 tablet by mouth daily.    Historical Provider, MD  Multiple Vitamins-Minerals (BODY/HAIR/SKIN/NAILS PO) Take 1 tablet by mouth daily.    Historical  Provider, MD  naproxen (NAPROSYN) 500 MG tablet Take 1 tablet (500 mg total) by mouth 2 (two) times daily. 03/10/17   Melanee Left, NP  Omega-3 Fatty Acids (FISH OIL) 1000 MG CAPS Take 1,000 mg by mouth daily.    Historical Provider, MD  pentosan polysulfate (ELMIRON) 100 MG capsule Take 1 capsule (100 mg total) by mouth 3 (three) times daily. 01/17/17   Chancy Milroy, MD  predniSONE (DELTASONE) 10 MG tablet 2 pills daily for 2 weeks, then 1 pill daily 10/21/16   Chesley Mires, MD  sodium chloride (OCEAN) 0.65 % SOLN nasal spray Place 1 spray into both nostrils as needed for congestion.    Historical Provider, MD  traMADol (ULTRAM) 50 MG tablet Take 1 tablet (50 mg total) by mouth every 8 (eight) hours as needed. 12/23/16   Dorena Dew, FNP   Meds Ordered and Administered this Visit   Medications  ketorolac (TORADOL) 30 MG/ML injection 30 mg (not administered)    BP 123/82 (BP Location: Right Arm)   Pulse 73   Temp 98.8 F (37.1 C) (Oral)   LMP 02/22/2017 (Approximate)   SpO2 97%  No data found.   Physical Exam  Constitutional: She appears well-developed.  Pacing floor crying in pain   Eyes: Pupils are equal, round, and reactive to light.  Neck: Normal range of motion.  Cardiovascular: Normal rate and regular rhythm.   Pulmonary/Chest: Effort normal and breath sounds normal.  Abdominal: Soft. Bowel sounds are normal.  Musculoskeletal: She exhibits tenderness.  Palpation to lt shoulder blade radiates to lt breast more axillary area.   Neurological: She is alert.  Skin: Rash noted. There is erythema.  No noted pustula's, mild light erythema noted     Urgent Care Course     Procedures (including critical care time)  Labs Review Labs Reviewed - No data to display  Imaging Review No results found.           MDM   1. Herpes zoster with other complication    Pt did not want to be placed on steroids due to hx of wt gain and not feeling good on them. We  discussed concerns of risk for getting shingles and treatment. Expressed that this may linger for several weeks.  Pain meds will help but will not take away the pain Reviewed previous charts on resp issues. Pt denies any sx from this.      Melanee Left, NP 03/10/17 1727

## 2017-03-17 ENCOUNTER — Ambulatory Visit: Payer: Medicare Other | Admitting: Obstetrics & Gynecology

## 2017-03-18 ENCOUNTER — Ambulatory Visit (INDEPENDENT_AMBULATORY_CARE_PROVIDER_SITE_OTHER): Payer: Medicare Other | Admitting: Family Medicine

## 2017-03-18 VITALS — BP 125/75 | HR 67 | Temp 98.0°F | Resp 16 | Ht 66.0 in | Wt 216.0 lb

## 2017-03-18 DIAGNOSIS — E8881 Metabolic syndrome: Secondary | ICD-10-CM | POA: Diagnosis not present

## 2017-03-18 DIAGNOSIS — T781XXA Other adverse food reactions, not elsewhere classified, initial encounter: Secondary | ICD-10-CM | POA: Diagnosis not present

## 2017-03-18 DIAGNOSIS — R52 Pain, unspecified: Secondary | ICD-10-CM | POA: Diagnosis not present

## 2017-03-18 DIAGNOSIS — E669 Obesity, unspecified: Secondary | ICD-10-CM

## 2017-03-18 DIAGNOSIS — Z79899 Other long term (current) drug therapy: Secondary | ICD-10-CM | POA: Diagnosis not present

## 2017-03-18 DIAGNOSIS — E66811 Obesity, class 1: Secondary | ICD-10-CM

## 2017-03-18 LAB — COMPLETE METABOLIC PANEL WITH GFR
ALT: 60 U/L — AB (ref 6–29)
AST: 39 U/L — AB (ref 10–30)
Albumin: 4.2 g/dL (ref 3.6–5.1)
Alkaline Phosphatase: 114 U/L (ref 33–115)
BILIRUBIN TOTAL: 0.9 mg/dL (ref 0.2–1.2)
BUN: 14 mg/dL (ref 7–25)
CO2: 25 mmol/L (ref 20–31)
CREATININE: 0.66 mg/dL (ref 0.50–1.10)
Calcium: 9.7 mg/dL (ref 8.6–10.2)
Chloride: 103 mmol/L (ref 98–110)
GFR, Est African American: >89 mL/min (ref >=60)
GLUCOSE: 73 mg/dL (ref 65–99)
Potassium: 4.1 mmol/L (ref 3.5–5.3)
SODIUM: 138 mmol/L (ref 135–146)
TOTAL PROTEIN: 7.5 g/dL (ref 6.1–8.1)

## 2017-03-18 NOTE — Progress Notes (Signed)
Subjective:    Patient ID: Rhonda Hester, female    DOB: 10/10/1976, 41 y.o.   MRN: [id number]  HPI Rhonda Hester, a 41 year old female with a history of sarcodoisis and interstitial cystitis presents for a follow up of chronic conditions.Rhonda Hester is followed by Dr. Chesley Mires, [facility] for sarcoidosis. Symptoms are controlled on Dulera daily. She is also followed by [facility] for interstitial cystitis, she has a follow up scheduled.  She says that she continues to have bladder spasms, but takes prescribed medications infrequently. Current pain intensity is 2/10.  She says that she was recently seen in the emergency department and diagnosed with shingles. She says that she was prescribed Acyclovir. She took medication for 2 days and experienced throat itching and shortness of breath.  She also endorses generalized pain. She denies headache, fatigue, shortness of breath, urinary frequency, dysuria, nausea, vomiting, or diarrhea.  Past Medical History:  Diagnosis Date  . Allergic asthma   . Eczema   . GERD (gastroesophageal reflux disease)   . History of anal fissures   . History of Bell's palsy    2006  RIGHT SIDE--  RESOLVED  . History of gastroesophageal reflux (GERD)   . Interstitial cystitis   . Sarcoidosis of lung ([place])    DX 2006--  PULMOLOGIST--  DR VHQI  . Seasonal allergic rhinitis   . Shortness of breath    Immunization History  Administered Date(s) Administered  . Influenza Whole 09/02/2011, 09/26/2012  . Influenza,inj,Quad PF,36+ Mos 12/27/2012, 09/04/2015  . Pneumococcal Polysaccharide-23 05/10/2016  . Tdap 05/10/2016   Social History   Social History  . Marital status: Single    Spouse name: N/A  . Number of children: N/A  . Years of education: N/A   Occupational History  . Not on file.   Social History Main Topics  . Smoking status: Former Smoker    Packs/day: 0.30    Years: 6.00    Types: Cigarettes, Cigars    Quit date:  11/2016  . Smokeless tobacco: Never Used     Comment: ONE CIG. DAILY /  1 PPMONTH  . Alcohol use No  . Drug use: No     Comment: HX MARIJUANA USE  . Sexual activity: No   Other Topics Concern  . Not on file   Social History Narrative  . No narrative on file   Review of Systems  Constitutional: Positive for unexpected weight change (weight gain). Negative for fatigue.  HENT: Negative.   Eyes: Negative.   Respiratory: Negative.  Negative for shortness of breath.   Cardiovascular: Negative.  Negative for chest pain, palpitations and leg swelling.  Gastrointestinal: Negative.   Endocrine: Negative.  Negative for polydipsia, polyphagia and polyuria.  Genitourinary: Negative.   Musculoskeletal: Positive for arthralgias and myalgias.  Skin: Positive for rash.  Allergic/Immunologic: Positive for environmental allergies and food allergies.  Neurological: Positive for weakness.  Hematological: Negative.   Psychiatric/Behavioral: Negative.  Negative for sleep disturbance and suicidal ideas.       Objective:   Physical Exam  Constitutional: She is oriented to person, place, and time. She appears well-developed and well-nourished.  HENT:  Head: Normocephalic and atraumatic.  Right Ear: External ear normal.  Left Ear: External ear normal.  Mouth/Throat: Oropharynx is clear and moist.  Eyes: Conjunctivae and EOM are normal. Pupils are equal, round, and reactive to light.  Neck: Normal range of motion. Neck supple.  Cardiovascular: Normal rate, regular rhythm and normal heart sounds.  Pulmonary/Chest: Effort normal and breath sounds normal.  Abdominal: Soft. Bowel sounds are normal.  Musculoskeletal: Normal range of motion.  Neurological: She is alert and oriented to person, place, and time. She has normal reflexes.  Skin: Skin is warm and dry.  Psychiatric: She has a normal mood and affect. Her behavior is normal. Judgment and thought content normal.      BP 125/75 (BP Location:  Left Arm, Patient Position: Sitting, Cuff Size: Large)   Pulse 67   Temp 98 F (36.7 C) (Oral)   Resp 16   Ht 5\' 6"  (1.676 m)   Wt 216 lb (98 kg)   LMP 03/15/2017   SpO2 100%   BMI 34.86 kg/m  Assessment & Plan:  1. Allergic reaction to food, initial encounter Patient had throat closing after taking Acyclovir and shortness of breath. She also had throat closing after eating grapes. Both were listed as allergies and patient was advised to discontinue medication.   2. Generalized pain - Rheumatoid factor - ANA - Sedimentation Rate  3. Obesity (BMI 30.0-34.9) Recommend a lowfat, low carbohydrate diet divided over 5-6 small meals, increase water intake to 6-8 glasses, and 150 minutes per week of cardiovascular exercise.    4. Metabolic syndrome  - HgB B7S - CBC with Differential - COMPLETE METABOLIC PANEL WITH GFR  5. Medication management Patient was advised to bring all medications for review. Ms. Gangi is a poor historian and is unsure of medication regimen.    RTC: Schedule follow up for medication management   Rhonda Pounds  MSN, FNP-C [facility] [place], [place] [id number] [phone]

## 2017-03-18 NOTE — Patient Instructions (Addendum)
Food Allergy A food allergy is when your body reacts to a food in a way that is not normal. The reaction can be gentle or very bad. Signs of a Gentle Reaction   Stuffy nose.  Tingling in the mouth.  An itchy, red rash.  Throwing up (vomiting).  Watery poop (diarrhea). Signs of a Very Bad Reaction   Puffiness (swelling). This may be on the lips, face, or tongue, or in the mouth or throat.  Breathing loudly (wheezing).  A hoarse voice.  Itchy, red, swollen areas of skin (hives).  Dizziness or light-headedness.  Fainting.  Trouble breathing or swallowing.  A tight feeling in the chest.  A very fast heartbeat. Follow these instructions at home: General instructions   Avoid the foods that you are allergic to.  Read food labels. Look for ingredients that you are allergic to.  When you are at a restaurant, tell your server that you have an allergy. Ask if your meal has an ingredient that you are allergic to.  Take medicines only as told by your doctor. Do not drive until the medicine has worn off, unless your doctor says it is okay.  Tell all people who care for you that you have a food allergy. This includes your doctor and dentist.  If you think that you might be allergic to something else, talk with your doctor. Do not eat a food to see if you are allergic to it without talking with your doctor first. If you have a very bad allergy:   Wear a bracelet or necklace that says you have an allergy.  Carry your allergy kit (anaphylaxis kit) or an allergy shot (epinephrine injection) with you all the time. Use them as told by your doctor.  Make sure that you, your family, and your boss know:  How to use your allergy kit.  How to give you an allergy shot.  If you use the medicine epinephrine:  Get more right away in case you have another reaction.  Get help. You can have a life-threatening reaction after taking the medicine. If you are being tested for an allergy   :  Follow a diet as told by your doctor.  Keep a food diary as told by your doctor. Every day, write down:  What you eat and drink and when.  What problems you have and when. Contact a doctor if:  The signs of your reaction have not gone away within 2 days.  The signs of your reaction get worse.  You have new signs of a reaction. Get help right away if:  You use the medicine epinephrine.  You are having a very bad reaction. Signs of a very bad reaction are:  Puffiness. This may be on the lips, face, or tongue, or in the mouth or throat.  Breathing loudly.  A hoarse voice.  Itchy, red swollen areas of skin.  Dizziness or light-headedness.  Fainting.  Trouble breathing or swallowing.  A tight feeling in the chest.  A very fast heartbeat. This information is not intended to replace advice given to you by your health care provider. Make sure you discuss any questions you have with your health care provider. Document Released: 06/02/2010 Document Revised: 05/20/2016 Document Reviewed: 09/24/2014 Elsevier Interactive Patient Education  2017 Elsevier Inc.  

## 2017-03-19 ENCOUNTER — Encounter: Payer: Self-pay | Admitting: Family Medicine

## 2017-03-19 DIAGNOSIS — T781XXA Other adverse food reactions, not elsewhere classified, initial encounter: Secondary | ICD-10-CM | POA: Insufficient documentation

## 2017-03-19 DIAGNOSIS — E669 Obesity, unspecified: Secondary | ICD-10-CM | POA: Insufficient documentation

## 2017-03-19 DIAGNOSIS — Z79899 Other long term (current) drug therapy: Secondary | ICD-10-CM | POA: Insufficient documentation

## 2017-03-19 DIAGNOSIS — E8881 Metabolic syndrome: Secondary | ICD-10-CM | POA: Insufficient documentation

## 2017-03-19 DIAGNOSIS — R52 Pain, unspecified: Secondary | ICD-10-CM | POA: Insufficient documentation

## 2017-03-19 LAB — CBC WITH DIFFERENTIAL/PLATELET
BASOS PCT: 1 %
Basophils Absolute: 58 cells/uL (ref 0–200)
EOS PCT: 6 %
Eosinophils Absolute: 348 cells/uL (ref 15–500)
HCT: 39.5 % (ref 35.0–45.0)
Hemoglobin: 12.7 g/dL (ref 11.7–15.5)
Lymphocytes Relative: 27 %
Lymphs Abs: 1566 cells/uL (ref 850–3900)
MCH: 28 pg (ref 27.0–33.0)
MCHC: 32.2 g/dL (ref 32.0–36.0)
MCV: 87 fL (ref 80.0–100.0)
MONOS PCT: 7 %
MPV: 9.3 fL (ref 7.5–12.5)
Monocytes Absolute: 406 cells/uL (ref 200–950)
NEUTROS ABS: 3422 {cells}/uL (ref 1500–7800)
Neutrophils Relative %: 59 %
PLATELETS: 284 10*3/uL (ref 140–400)
RBC: 4.54 MIL/uL (ref 3.80–5.10)
RDW: 14.1 % (ref 11.0–15.0)
WBC: 5.8 10*3/uL (ref 3.8–10.8)

## 2017-03-19 LAB — SEDIMENTATION RATE: SED RATE: 2 mm/h (ref 0–20)

## 2017-03-21 LAB — RHEUMATOID FACTOR

## 2017-03-22 LAB — ANA: Anti Nuclear Antibody(ANA): NEGATIVE

## 2017-03-28 ENCOUNTER — Encounter: Payer: Self-pay | Admitting: Obstetrics & Gynecology

## 2017-03-28 ENCOUNTER — Ambulatory Visit (INDEPENDENT_AMBULATORY_CARE_PROVIDER_SITE_OTHER): Payer: Medicare Other | Admitting: Obstetrics & Gynecology

## 2017-03-28 VITALS — BP 118/83 | HR 72 | Wt 220.0 lb

## 2017-03-28 DIAGNOSIS — D251 Intramural leiomyoma of uterus: Secondary | ICD-10-CM

## 2017-03-28 DIAGNOSIS — R102 Pelvic and perineal pain: Secondary | ICD-10-CM | POA: Insufficient documentation

## 2017-03-28 DIAGNOSIS — Z3202 Encounter for pregnancy test, result negative: Secondary | ICD-10-CM

## 2017-03-28 LAB — POCT URINE PREGNANCY: Preg Test, Ur: NEGATIVE

## 2017-03-28 MED ORDER — LEUPROLIDE ACETATE 3.75 MG IM KIT
3.7500 mg | PACK | Freq: Once | INTRAMUSCULAR | Status: AC
  Administered 2017-03-28: 3.75 mg via INTRAMUSCULAR

## 2017-03-28 NOTE — Progress Notes (Signed)
   Subjective:    Patient ID: Rhonda Hester, female    DOB: [birth year], 41 y.o.   MRN: [id number]  HPI 41 yo S AAP0 here today to discuss her pelvic pain. This has been present for 4-5 years. She had a CT and u/s last year that showed several fibroids, the largest was 3.9 cm. She also is treated for IC.  She has a monthly periods, lasting 3-4 days. She has extreme pain even prior to her periods, "I ball up in the bed with pain".   Review of Systems Abstinent for 20+ years On disability for sarcoidosis Negative pap 6/17 FH- no breast/gyn/colon cancer    Objective:   Physical Exam [id number] Breathing, conversing, and ambulating normally Narrow pubic arch Uterus ULN size, NT, no palpable adnexal masses       Assessment & Plan:  Pelvic pain- It is very hard to distinguish how much of a role her fibroids are playing in her pelvic pain due to her IC.  I have discussed the possibility of a hysterectomy, however, she may want children in the future. I also offered depo provera or depo [name]. She does not want to use depo provera due to her weight.   IC- rec urology. She has seen them but not in along time as she doesn't want to diet management.  She will get a 1 month depo [name] shot today and I will have a 3 month shot ordered for her next visit. RTC 4 weeks

## 2017-03-28 NOTE — Progress Notes (Signed)
Lower abdominal pains

## 2017-03-28 NOTE — Addendum Note (Signed)
Addended by: Lewie Loron D on: 03/28/2017 10:45 AM   Modules accepted: Orders

## 2017-03-29 ENCOUNTER — Telehealth: Payer: Self-pay

## 2017-03-29 NOTE — Telephone Encounter (Signed)
[place],  Patient is requesting a referral to pain management for her ongoing pain. Can this be done? Please advise.

## 2017-03-30 NOTE — Telephone Encounter (Signed)
Called patient no answer. Left a message that patient will need to be assessed here in office before we can refer to pain management. She has an upcoming appointment in June if this is too far out for her she can make appointment sooner. Thanks!

## 2017-04-01 ENCOUNTER — Ambulatory Visit (INDEPENDENT_AMBULATORY_CARE_PROVIDER_SITE_OTHER): Payer: Medicare Other | Admitting: Family Medicine

## 2017-04-01 ENCOUNTER — Encounter: Payer: Self-pay | Admitting: Family Medicine

## 2017-04-01 VITALS — BP 127/75 | HR 73 | Temp 98.5°F | Resp 18 | Ht 66.0 in | Wt 218.0 lb

## 2017-04-01 DIAGNOSIS — F0631 Mood disorder due to known physiological condition with depressive features: Secondary | ICD-10-CM

## 2017-04-01 DIAGNOSIS — M79605 Pain in left leg: Secondary | ICD-10-CM | POA: Diagnosis not present

## 2017-04-01 DIAGNOSIS — Z79899 Other long term (current) drug therapy: Secondary | ICD-10-CM | POA: Diagnosis not present

## 2017-04-01 DIAGNOSIS — F329 Major depressive disorder, single episode, unspecified: Secondary | ICD-10-CM

## 2017-04-01 DIAGNOSIS — R52 Pain, unspecified: Secondary | ICD-10-CM

## 2017-04-01 DIAGNOSIS — M79604 Pain in right leg: Secondary | ICD-10-CM | POA: Diagnosis not present

## 2017-04-01 DIAGNOSIS — D860 Sarcoidosis of lung: Secondary | ICD-10-CM

## 2017-04-01 MED ORDER — DULOXETINE HCL 20 MG PO CPEP: 20.0000 mg | ORAL_CAPSULE | Freq: Every day | ORAL | 5 refills | Status: DC

## 2017-04-01 NOTE — Patient Instructions (Addendum)
Start Cymbalta 20 mg daily for depression. Referral to psychology.   Sarcoidosis Sarcoidosis is a disease that causes inflammation in your organs and other areas of your body. The lungs are most often affected (pulmonary sarcoidosis). Sarcoidosis can also affect your lymph nodes, liver, eyes, skin, or any other body tissue. When you have sarcoidosis, small clumps of tissue (granulomas) form in the affected area of your body. Granulomas are made up of your body's defense (immune) cells. Inflammation results when your body reacts to a harmful substance. Normally, inflammation goes away after immune cells get rid of the harmful substance. In sarcoidosis, the immune cells form granulomas instead. What are the causes? The exact cause of sarcoidosis is not known. Something triggers the immune system to respond, such as dust, chemicals, bacteria, or a virus. What increases the risk? You may be at a greater risk for sarcoidosis if you:  Have a family history of the disease.  Are African American.  Are of Northern European ancestry.  Are 60-41 years old.  Are female. What are the signs or symptoms? Many people with sarcoidosis have no symptoms. Others have very mild symptoms. Sarcoidosis most often affects the lungs. Symptoms include:  Chest pain.  Coughing.  Wheezing.  Shortness of breath. Other common symptoms include:  Night sweats.  Weight loss.  Fatigue.  Depression.  A sense of uneasiness. How is this diagnosed? Sarcoidosis may be diagnosed by:  Medical history and physical exam.  Chest X-ray. This looks for granulomas in your lungs.  Lung function tests. These measure your breathing and look for problems related to sarcoidosis.  Examining a sample of tissue under a microscope (biopsy). How is this treated? Sarcoidosis usually clears up without treatment. You may take medicines to reduce inflammation or relieve symptoms. These may include:  Prednisone. This steroid  reduces inflammation related to sarcoidosis.  Chloroquine or hydroxychloroquine. These are antimalarial medicines used to treat sarcoidosis that affects the skin or brain.  Methotrexate, leflunomide, or azathioprine. These medicines affect the immune system and can help with sarcoidosis in the joints, eyes, skin, or lungs.  Inhalers. Inhaled medicines can help you breathe if sarcoidosis is affecting your lungs. Follow these instructions at home:  Do not use any tobacco products, including cigarettes, chewing tobacco, or electronic cigarettes. If you need help quitting, ask your health care provider.  Avoid secondhand smoke.  Avoid irritating dust and chemicals. Stay indoors on days when air quality is poor in your area.  Take medicines only as directed by your health care provider. Contact a health care provider if:  You have vision problems.  You have shortness of breath.  You have a dry, persistent cough.  You have an irregular heartbeat.  You have pain or ache in your joints, hands, or feet.  You have an unexplained rash. Get help right away if: You have chest pain. This information is not intended to replace advice given to you by your health care provider. Make sure you discuss any questions you have with your health care provider. Document Released: 10/13/2004 Document Revised: 05/20/2016 Document Reviewed: 04/10/2014 Elsevier Interactive Patient Education  2017 [employer].

## 2017-04-02 DIAGNOSIS — F32A Depression, unspecified: Secondary | ICD-10-CM | POA: Insufficient documentation

## 2017-04-02 DIAGNOSIS — F0631 Mood disorder due to known physiological condition with depressive features: Secondary | ICD-10-CM | POA: Insufficient documentation

## 2017-04-02 NOTE — Progress Notes (Addendum)
Subjective:    Patient ID: Rhonda Hester, female    DOB: [birth year], 41 y.o.   MRN: [id number]  HPI [name], a 41 year old female with a history of sarcoidosis and generalized pain presents for a medication management appointment. Patient was last evaluated in the office on 03/18/2017 and was asked to follow up for medication management. Patient was very unsure of medication regimen and why she had been prescribed specific medications. She has all prescribed medications in a shopping bag. She is also followed by a number of specialists and has been non adherent to medication regimen due to the fact that she does not understand diagnoses or medication(s). Patient states that she has been complaining of the same symptoms, but medical providers are not listening to her.  She is complaining of generalized pain primarily to lower extremities.  Patient complains of arthralgias and myalgias for which has been present for several months. Pain is located in multiple joints, is described as aching and throbbing, and is moderate .  Associated symptoms include: decreased range of motion and tenderness.  The patient has tried NSAIDs and rest for pain, with no relief. She was previously prescribed analgesics for pain, but has not taken medications consistently. She is asking for a referral to pain management for pain control.  Outpatient Encounter Prescriptions as of 04/01/2017  Medication Sig  . albuterol (PROAIR HFA) 108 (90 BASE) MCG/ACT inhaler Inhale 2 puffs into the lungs every 6 (six) hours as needed for shortness of breath.  . cetirizine (ZYRTEC) 10 MG tablet Take 10 mg by mouth daily.  . clindamycin (CLEOCIN T) 1 % lotion Apply topically 2 (two) times daily.  . hydrOXYzine (ATARAX/VISTARIL) 10 MG tablet Take 10 mg by mouth at bedtime as needed.  . LUTEIN PO Take 1 tablet by mouth daily.  . mometasone (NASONEX) 50 MCG/ACT nasal spray Place 2 sprays into the nose daily.  .  mometasone-formoterol (DULERA) 100-5 MCG/ACT AERO Inhale 2 puffs into the lungs 2 (two) times daily.  . sodium chloride (OCEAN) 0.65 % SOLN nasal spray Place 1 spray into both nostrils as needed for congestion.  Marland Kitchen tretinoin (RETIN-A) 0.05 % cream Apply topically at bedtime.  . [DISCONTINUED] ibuprofen (ADVIL,MOTRIN) 800 MG tablet Take 800 mg by mouth every 8 (eight) hours as needed.  . DULoxetine (CYMBALTA) 20 MG capsule Take 1 capsule (20 mg total) by mouth daily.  . Multiple Vitamin (MULTIVITAMIN WITH MINERALS) TABS tablet Take 1 tablet by mouth daily.  . Multiple Vitamins-Minerals (BODY/HAIR/SKIN/NAILS PO) Take 1 tablet by mouth daily.  . Omega-3 Fatty Acids (FISH OIL) 1000 MG CAPS Take 1,000 mg by mouth daily.  . [DISCONTINUED] acyclovir (ZOVIRAX) 400 MG tablet Take 1 tablet (400 mg total) by mouth 4 (four) times daily. (Patient not taking: Reported on 03/18/2017)  . [DISCONTINUED] ibuprofen (ADVIL,MOTRIN) 600 MG tablet Take 1 tablet (600 mg total) by mouth every 8 (eight) hours as needed. (Patient not taking: Reported on 03/18/2017)  . [DISCONTINUED] Meth-Hyo-M Bl-Na Phos-Ph Sal (URIBEL) 118 MG CAPS Take 1 capsule (118 mg total) by mouth 3 (three) times daily. (Patient not taking: Reported on 04/01/2017)  . [DISCONTINUED] pentosan polysulfate (ELMIRON) 100 MG capsule Take 1 capsule (100 mg total) by mouth 3 (three) times daily. (Patient not taking: Reported on 04/01/2017)   Facility-Administered Encounter Medications as of 04/01/2017  Medication  . bupivacaine (MARCAINE) 0.5 % 15 mL, phenazopyridine (PYRIDIUM) 400 mg bladder mixture   Past Medical History:  Diagnosis Date  .  Allergic asthma   . Eczema   . GERD (gastroesophageal reflux disease)   . History of anal fissures   . History of Bell's palsy    2006  RIGHT SIDE--  RESOLVED  . History of gastroesophageal reflux (GERD)   . Interstitial cystitis   . Sarcoidosis of lung ([place])    DX 2006--  PULMOLOGIST--  DR PPJK  . Seasonal allergic  rhinitis   . Shortness of breath     Review of Systems  Constitutional: Positive for activity change (Due to increased pain) and fatigue. Negative for appetite change.  HENT: Negative.   Eyes: Negative.   Respiratory: Negative.  Negative for chest tightness and shortness of breath.   Cardiovascular: Negative.  Negative for leg swelling.  Gastrointestinal: Negative.   Endocrine: Negative for polydipsia, polyphagia and polyuria.  Genitourinary: Negative.   Musculoskeletal: Positive for arthralgias, joint swelling and myalgias.  Neurological: Positive for weakness (Lower extremities).       Objective:   Physical Exam  Constitutional: She is oriented to person, place, and time. She appears well-developed and well-nourished.  HENT:  Head: Normocephalic and atraumatic.  Right Ear: External ear normal.  Left Ear: External ear normal.  Mouth/Throat: Oropharynx is clear and moist.  Eyes: Conjunctivae and EOM are normal. Pupils are equal, round, and reactive to light.  Neck: Normal range of motion. Neck supple.  Cardiovascular: Normal rate, regular rhythm, normal heart sounds and intact distal pulses.   Pulmonary/Chest: Effort normal and breath sounds normal.  Abdominal: Soft. Bowel sounds are normal.  Musculoskeletal: Normal range of motion. She exhibits tenderness.  Neurological: She is alert and oriented to person, place, and time. She has normal reflexes.  Skin: Skin is warm and dry.  Psychiatric: She has a normal mood and affect. Her behavior is normal. Judgment and thought content normal.      BP 127/75 (BP Location: Left Arm, Patient Position: Sitting, Cuff Size: Large)   Pulse 73   Temp 98.5 F (36.9 C) (Oral)   Resp 18   Ht 5\' 6"  (1.676 m)   Wt 218 lb (98.9 kg)   LMP 03/15/2017   SpO2 100%   BMI 35.19 kg/m     Assessment & Plan:  1. Medication management Reviewed medications at length. Separated old medication bottles to decrease confusion. Also, labeled  medications according to diagnosis. She expressed understanding of medications. Also, discussed the importance of medication adherence. She is call the nurses line whenever she is unsure of medications or regimen. Explained that her updated medication list will be attached to her after visit summary.   2. Generalized pain I will not send a referral to pain management at this time. Patient did not have a clear understanding of medications and only tried prescribed analgesics once. If she did not achieve desired relief after one or two doses she would discontinue medication. Explained to patient that medications have to be taken consistently in order to achieve positive outcomes.  Also, Ms. Sundby does not understand the nature of her chronic conditions. Explained Sarcoidosis. Sarcoidosis is an inflammatory autoimmune illness that primarily affects the lungs. However, due to its inflammatory properties, it can cause wide spread joint pain.   3. Bilateral lower extremity pain Recommend analgesics as previously prescribed.   4. Depression due to physical illness Depression screen [facility] 2/9 04/02/2017 04/01/2017 11/15/2016 06/14/2016 05/10/2016  Decreased Interest 2 1 0 0 0  Down, Depressed, Hopeless 1 0 1 0 0  PHQ - 2 Score 3 1  1 0 0  Altered sleeping 1 - - - -  Tired, decreased energy 2 - - - -  Change in appetite 0 - - - -  Feeling bad or failure about yourself  1 - - - -  Trouble concentrating 1 - - - -  Moving slowly or fidgety/restless 0 - - - -  Suicidal thoughts 0 - - - -  PHQ-9 Score 8 - - - -   - DULoxetine (CYMBALTA) 20 MG capsule; Take 1 capsule (20 mg total) by mouth daily.  Dispense: 30 capsule; Refill: 5 - Ambulatory referral to Psychology  5. Sarcoidosis of lung [facility]) Patient is followed by Dr. Melvyn Novas. Discussed the importance of following up with Dr. Melvyn Novas as scheduled in order to manage sarcoidosis appropriately. Ms. Omura expressed understanding.    RTC: 1 month for  depression.  Greater than 50% of time was spent counseling about medication management    [name]  MSN, FNP-C [facility] [place], [place] [phone]

## 2017-04-25 ENCOUNTER — Encounter: Payer: Self-pay | Admitting: Acute Care

## 2017-04-25 ENCOUNTER — Ambulatory Visit (INDEPENDENT_AMBULATORY_CARE_PROVIDER_SITE_OTHER): Payer: Medicare Other | Admitting: Acute Care

## 2017-04-25 ENCOUNTER — Other Ambulatory Visit: Payer: Medicare Other

## 2017-04-25 VITALS — BP 116/72 | HR 72 | Ht 67.0 in | Wt 218.0 lb

## 2017-04-25 DIAGNOSIS — Z6831 Body mass index (BMI) 31.0-31.9, adult: Secondary | ICD-10-CM

## 2017-04-25 DIAGNOSIS — J45909 Unspecified asthma, uncomplicated: Secondary | ICD-10-CM

## 2017-04-25 DIAGNOSIS — D860 Sarcoidosis of lung: Secondary | ICD-10-CM

## 2017-04-25 MED ORDER — PREDNISONE 10 MG PO TABS: ORAL_TABLET | ORAL | 0 refills | Status: DC

## 2017-04-25 NOTE — Assessment & Plan Note (Addendum)
Patient had weaned herself off prednisone No current Pulmonary complaints. Patient complains of rash around her ears that itches ACE Level  04/25/2017>> 233U/L Plan We will try a short prednisone taper for the rash. Return to your dermatologist for follow up. Prednisone taper; 10 mg tablets: 4 tabs x 2 days, 3 tabs x 2 days, 2 tabs x 2 days 1 tab x 2 days then stop. We will check an ACE level today. ( Blood work) We will check on referral for dietitian.( Please send with PCP) Continue Dulera twice daily as you have been doing Rinse after use. Continue using your nasal sprays Continue using your Zyrtec. Follow up with with Dr. Halford Chessman at first available. Please contact office for sooner follow up if symptoms do not improve or worsen or seek emergency care  Addendum: Will extend prednisone taper to 7 additional days of 5 mg after completion of 10 mg dosage of original taper, and have her follow up in 4 weeks with CXR prior.

## 2017-04-25 NOTE — Progress Notes (Addendum)
History of Present Illness Rhonda Hester is a 41 y.o. female current smoker with pulmonary sarcoidosis and chronic asthma. She is followed by Dr.Sood   04/26/2017 3 month follow up for sarcoid Pt.presents for 3 month follow up. She states she had been doing well with the exception of seeing double after exerting herself running up the stairs. She feels this is due to lightheadedness. She said this was a one-time event that self resolved. She has been to see an opthalmologist in 2018. We discussed the importance of annual ophthalmic appointments to maintain eye health with the diagnosis of sarcoidosis. She states she has no body aches, but she is disturbed by rash behind her ears. It is a fine raised rash which itches.She was given medication for this by dermatology and was told to follow up in 3 months if it did not resolve. She has not followed up with dermatology. Additionally we had referred her for dietitian for weight loss. She states she was never contacted, but she did not follow up. She does not like taking prednisone, but wants to try a short taper to see if the rash improves. She is compliant with Dulera daily.She is using her nasal sprays and Zyrtec as directed as she feels she is having seasonal allergies at the present time.She states she rarely uses her rescue inhaler. She denies fever, chest pain, orthopnea, or hemoptysis. She states that she has no pulmonary issues, that her primary concern right now is the rash around her ears that is not resolving with dermatological treatment.  Test Results:  04/25/2017> ACE Level: 233 Pulmonary tests RAST 07/22/11 >>Multiple allergens, IgE 71.8  PFT 09/02/11 >> FEV1 2.80(93%), FEV1% 82, TLC 4.74(89%), DLCO 59%, no BD PFT 09/25/13 >> FEV1 2.75 (95%), FEV1% 81, TLC 4.25 (77%), DLCO 64%, no BD ACE 09/24/16 >> 219 PFT 11/24/16 >> FEV1 2.52 (89%), FEV1% 80, TLC 4.65 (84%), DLCO 56%, no BD  CT chests CT chest 12/30/04 >>b/l hilar and  mediastinal adenopathy with b/l nodular interstitial opacities  CT chest 07/08/08 >>decreased lymph nodes, scarring with volume loss  CT chest 11/20/13 >>Scattered areas of ATX CT chest 04/02/14 >> calcified Rt hilar LAN, scattered areas of scarring CT chest 10/01/16 >> stable scarring and traction BTX  CBC Latest Ref Rng & Units 03/18/2017 12/05/2016 09/24/2016  WBC 3.8 - 10.8 K/uL 5.8 6.7 6.2  Hemoglobin 11.7 - 15.5 g/dL 12.7 13.3 12.4  Hematocrit 35.0 - 45.0 % 39.5 39.1 37.0  Platelets 140 - 400 K/uL 284 266 264.0    BMP Latest Ref Rng & Units 03/18/2017 12/05/2016 09/24/2016  Glucose 65 - 99 mg/dL 73 84 74  BUN 7 - 25 mg/dL 14 9 6   Creatinine 0.50 - 1.10 mg/dL 0.66 0.65 0.68  Sodium 135 - 146 mmol/L 138 137 138  Potassium 3.5 - 5.3 mmol/L 4.1 4.1 3.7  Chloride 98 - 110 mmol/L 103 104 105  CO2 20 - 31 mmol/L 25 26 28   Calcium 8.6 - 10.2 mg/dL 9.7 9.7 8.9      PFT    Component Value Date/Time   FEV1PRE 2.42 11/24/2016 1041   FEV1POST 2.52 11/24/2016 1041   FVCPRE 3.09 11/24/2016 1041   FVCPOST 3.13 11/24/2016 1041   TLC 4.65 11/24/2016 1041   DLCOUNC 15.92 11/24/2016 1041   PREFEV1FVCRT 78 11/24/2016 1041   PSTFEV1FVCRT 80 11/24/2016 1041    No results found.   Past medical hx Past Medical History:  Diagnosis Date  . Allergic asthma   .  Eczema   . GERD (gastroesophageal reflux disease)   . History of anal fissures   . History of Bell's palsy    2006  RIGHT SIDE--  RESOLVED  . History of gastroesophageal reflux (GERD)   . Interstitial cystitis   . Sarcoidosis of lung ([place])    DX 2006--  PULMOLOGIST--  DR TOIZ  . Seasonal allergic rhinitis   . Shortness of breath      Social History  Substance Use Topics  . Smoking status: Former Smoker    Packs/day: 0.30    Years: 6.00    Types: Cigarettes, Cigars    Quit date: 11/2016  . Smokeless tobacco: Never Used     Comment: ONE CIG. DAILY /  1 PPMONTH  . Alcohol use No    Tobacco Cessation: I have spent 3  minutes counseling patient on smoking cessation this visit, and the health risks of continued tobacco abuse.   Past surgical hx, Family hx, Social hx all reviewed.  Current Outpatient Prescriptions on File Prior to Visit  Medication Sig  . albuterol (PROAIR HFA) 108 (90 BASE) MCG/ACT inhaler Inhale 2 puffs into the lungs every 6 (six) hours as needed for shortness of breath.  . cetirizine (ZYRTEC) 10 MG tablet Take 10 mg by mouth daily.  . clindamycin (CLEOCIN T) 1 % lotion Apply topically 2 (two) times daily.  . DULoxetine (CYMBALTA) 20 MG capsule Take 1 capsule (20 mg total) by mouth daily.  . hydrOXYzine (ATARAX/VISTARIL) 10 MG tablet Take 10 mg by mouth at bedtime as needed.  . LUTEIN PO Take 1 tablet by mouth daily.  . mometasone (NASONEX) 50 MCG/ACT nasal spray Place 2 sprays into the nose daily.  . mometasone-formoterol (DULERA) 100-5 MCG/ACT AERO Inhale 2 puffs into the lungs 2 (two) times daily.  . Multiple Vitamin (MULTIVITAMIN WITH MINERALS) TABS tablet Take 1 tablet by mouth daily.  . Multiple Vitamins-Minerals (BODY/HAIR/SKIN/NAILS PO) Take 1 tablet by mouth daily.  . Omega-3 Fatty Acids (FISH OIL) 1000 MG CAPS Take 1,000 mg by mouth daily.  . sodium chloride (OCEAN) 0.65 % SOLN nasal spray Place 1 spray into both nostrils as needed for congestion.  Marland Kitchen tretinoin (RETIN-A) 0.05 % cream Apply topically at bedtime.   Current Facility-Administered Medications on File Prior to Visit  Medication  . bupivacaine (MARCAINE) 0.5 % 15 mL, phenazopyridine (PYRIDIUM) 400 mg bladder mixture     Allergies  Allergen Reactions  . Acyclovir And Related     Patient states it "made me feel like I can't breath"   . Citric Acid Other (See Comments)    AVOIDS DUE TO INTERSTITIAL CYSTITIS  . Dust Mite Extract Itching and Other (See Comments)     coughing  . Grapeseed Extract [Nutritional Supplements]     Throat itching and burning  . Pollen Extract Itching and Other (See Comments)      coughing  . Nickel Rash    Review Of Systems:  Constitutional:   No  weight loss, night sweats,  Fevers, chills, fatigue, or  lassitude.  HEENT:   No headaches,  Difficulty swallowing,  Tooth/dental problems, or  Sore throat,                No sneezing,+ itching, ear ache, nasal congestion,+ post nasal drip,   CV:  No chest pain,  Orthopnea, PND, swelling in lower extremities, anasarca, dizziness, palpitations, syncope.   GI  No heartburn, indigestion, abdominal pain, nausea, vomiting, diarrhea, change in bowel habits, loss of  appetite, bloody stools.   Resp: + shortness of breath with exertion not at rest.  No excess mucus, no productive cough,  No non-productive cough,  No coughing up of blood.  No change in color of mucus.  Rare occasional wheezing.  No chest wall deformity  Skin: no rash or lesions.  GU: no dysuria, change in color of urine, no urgency or frequency.  No flank pain, no hematuria   MS:  No joint pain or swelling.  No decreased range of motion.  No back pain.  Psych:  No change in mood or affect. No depression or anxiety.  No memory loss.   Vital Signs BP 116/72 (BP Location: Left Arm, Cuff Size: Normal)   Pulse 72   Ht 5\' 7"  (1.702 m)   Wt 218 lb (98.9 kg)   SpO2 98%   BMI 34.14 kg/m    Physical Exam:  General- No distress,  A&Ox 3, pleasant ENT: No sinus tenderness, TM clear, pale nasal mucosa, no oral exudate,no post nasal drip, no LAN Cardiac: S1, S2, regular rate and rhythm, no murmur Chest: No wheeze/ rales/ dullness; no accessory muscle use, no nasal flaring, no sternal retractions Abd.: Soft Non-tender, obese Ext: No clubbing cyanosis, edema Neuro:  normal strength Skin: No rashes, warm and dry Psych: Appears anxious   Assessment/Plan  Sarcoidosis of lung Patient had weaned herself off prednisone No current Pulmonary complaints. Patient complains of rash around her ears that itches ACE Level  04/25/2017>> 233U/L Plan We will try a  short prednisone taper for the rash. Return to your dermatologist for follow up. Prednisone taper; 10 mg tablets: 4 tabs x 2 days, 3 tabs x 2 days, 2 tabs x 2 days 1 tab x 2 days then stop. We will check an ACE level today. ( Blood work) We will check on referral for dietitian.( Please send with PCP) Continue Dulera twice daily as you have been doing Rinse after use. Continue using your nasal sprays Continue using your Zyrtec. Follow up with with Dr. Halford Chessman at first available. Please contact office for sooner follow up if symptoms do not improve or worsen or seek emergency care  Addendum: Will extend prednisone taper to 7 additional days of 5 mg after completion of 10 mg dosage of original taper, and have her follow up in 4 weeks with CXR prior.   Allergic asthma Stable interval Plan Continue Dulera daily Pro Air rescue inhaler as needed for shortness of breath or wheezing    Magdalen Spatz, NP 04/26/2017  3:45 PM

## 2017-04-25 NOTE — Assessment & Plan Note (Signed)
Stable interval Plan Continue Dulera daily Pro Air rescue inhaler as needed for shortness of breath or wheezing

## 2017-04-25 NOTE — Patient Instructions (Addendum)
It is good to see you today. We will try a short prednisone taper for the rash. Return to your dermatologist for follow up. Prednisone taper; 10 mg tablets: 4 tabs x 2 days, 3 tabs x 2 days, 2 tabs x 2 days 1 tab x 2 days then stop. We will check an ACE level today. ( Blood work) We will check on referral for dietitian.( Please send with PCP) Continue Dulera twice daily as you have been doing Rinse after use. Continue using your nasal sprays Continue using your Zyrtec. Follow up with with Dr. Halford Chessman at first available. Please contact office for sooner follow up if symptoms do not improve or worsen or seek emergency care

## 2017-04-26 LAB — ANGIOTENSIN CONVERTING ENZYME: Angiotensin-Converting Enzyme: 233 U/L — ABNORMAL HIGH (ref 9–67)

## 2017-04-27 ENCOUNTER — Other Ambulatory Visit: Payer: Self-pay

## 2017-04-27 ENCOUNTER — Telehealth: Payer: Self-pay

## 2017-04-27 DIAGNOSIS — D860 Sarcoidosis of lung: Secondary | ICD-10-CM

## 2017-04-27 MED ORDER — PREDNISONE 5 MG PO TABS: 5.0000 mg | ORAL_TABLET | Freq: Every day | ORAL | 0 refills | Status: DC

## 2017-04-27 NOTE — Telephone Encounter (Signed)
Pt called asking about Depo [name]. Pt aware medication arrived yesterday. Pt due for injection today but is not able to present to the office today. Pt available tomorrow for injection.

## 2017-04-27 NOTE — Progress Notes (Signed)
I have reviewed and agree with assessment/plan.  Chesley Mires, MD [facility] Pulmonary/Critical Care 04/27/2017, 8:44 AM Pager:  [phone]

## 2017-04-28 ENCOUNTER — Other Ambulatory Visit: Payer: Self-pay

## 2017-04-28 MED ORDER — LEUPROLIDE ACETATE 3.75 MG IM KIT: 3.7500 mg | PACK | Freq: Once | INTRAMUSCULAR | Status: DC

## 2017-04-28 MED ORDER — LEUPROLIDE ACETATE (3 MONTH) 11.25 MG IM KIT
11.2500 mg | PACK | INTRAMUSCULAR | Status: DC
Start: 2017-04-28 — End: 2020-04-23
  Administered 2017-04-28 – 2018-03-01 (×3): 11.25 mg via INTRAMUSCULAR

## 2017-05-09 ENCOUNTER — Encounter: Payer: Self-pay | Admitting: Registered"

## 2017-05-09 ENCOUNTER — Encounter: Payer: Medicare Other | Attending: Family Medicine | Admitting: Registered"

## 2017-05-09 DIAGNOSIS — Z6831 Body mass index (BMI) 31.0-31.9, adult: Secondary | ICD-10-CM | POA: Insufficient documentation

## 2017-05-09 DIAGNOSIS — Z713 Dietary counseling and surveillance: Secondary | ICD-10-CM | POA: Insufficient documentation

## 2017-05-09 NOTE — Progress Notes (Signed)
Medical Nutrition Therapy:  Appt start time: 1740 end time:  1055.   Assessment:  Primary concerns today: Pt referred for obesity. Pt says she would like information regarding how she should eat to avoid bladder issues and also pt says she is worried about her weight gain. Pt says she has been depressed on and off due to physical pain but says she has been referred for counseling and is scheduled to see a counselor. Pt also says diabetes runs in her family as her mother was recently dx with it and that she is concerned about developing diabetes.   Preferred Learning Style:  No preference indicated   Learning Readiness:  Not ready  Contemplating  Ready  Change in progress  MEDICATIONS: See list.    DIETARY INTAKE:  Usual eating pattern includes 1 meal and several snacks per day.  Everyday foods include chicken, salad, cereal, eggs, [place] sausage.  Avoided foods include spicy foods, pineapple, grapes, and tomatoes.  Pt says fresh pineapple makes her face burn and eating raw, whole grapes makes her throat swell.   Pt says she often does not feel like fixing dinner when she gets home from work (work schedule:11-7 pm) and so she often just grabs something to eat before going to bed.   24-hr recall:  B ( AM): 1 cup coffee with cream, sometimes fruit smoothie with meal replacement powder Snk ( AM): none indicated L ( PM): peanut butter crackers or sometimes cheese pizza, fruit smoothie with meal replacement powder Snk ( PM): chips D ( PM): 2 PB&J and chips  Snk ( PM): Not usually-sometimes snack foods Beverages: water  Pt says meals are usually eaten on the sofa with the TV on. Pt says she sometimes eats fast.   Usual physical activity: Pt says she always hurts afterward if she goes to the gym. Pt says she has a treadmill at home that she could start using to walk more.   Progress Towards Goal(s):  In progress.   Nutritional Diagnosis:  NI-5.11.1 Predicted suboptimal  nutrient intake As related to inconsistent and unbalanced meals.  As evidenced by pt's diet recall.    Intervention:  Nutrition counseling provided. Dietitian counseled pt regarding the importance of eating consistent and balanced meals and how skipping meals can lead to slowed metabolism and weight gain. Dietitian counseled pt regarding foods that may worsen bladder irritation with pt's interstitial cystitis. Dietitian encouraged pt to keep a food log to better evaluate which foods may be causing irritation. Dietitian discussed the importance of mindful eating and encouraged pt to eat at a table and without the distraction of electronics to better listen to hunger/satiety cues. Dietitian encourage pt to include regular physical activity. Dietitian encouraged pt to attend counseling appointment scheduled by MD to help with depression.   Goals:   Try to get in 3 meals per day. Try to eat balanced meals (see handout)-include protein and carbohydrates at each meal.   Try to eat mindfully so that you can better notice hunger and fullness cues. Try to sit down in a quiet environment at the table without electronics/distraction.   Might want to try to track what you are eating to see what may be causing bladder irritation.   Try to get in regular physical activity as able and recommended by MD. Maybe try walking more.  Teaching Method Utilized: Auditory  Handouts given during visit include:  Balanced Plate   My Plate Planner  Barriers to learning/adherence to lifestyle change: None indicated.  Demonstrated degree of understanding via:  Teach Back   Monitoring/Evaluation:  Dietary intake, exercise, and body weight in 2 month(s).

## 2017-05-09 NOTE — Patient Instructions (Addendum)
Try to get in 3 meals per day. Try to eat balanced meals (see handout)-include protein and carbohydrates at each meal.   Try to eat mindfully so that you can better notice hunger and fullness cues. Try to sit down in a quiet environment at the table without electronics/distraction.   Might want to try to track what you are eating to see what may be causing bladder irritation.   Try to get in regular physical activity as able and recommended by MD. Maybe try walking more.

## 2017-05-18 ENCOUNTER — Encounter (HOSPITAL_COMMUNITY): Payer: Self-pay | Admitting: Psychiatry

## 2017-05-18 ENCOUNTER — Ambulatory Visit (INDEPENDENT_AMBULATORY_CARE_PROVIDER_SITE_OTHER): Payer: 59 | Admitting: Psychiatry

## 2017-05-18 VITALS — BP 130/78 | HR 72 | Ht 67.0 in | Wt 219.0 lb

## 2017-05-18 DIAGNOSIS — Z87891 Personal history of nicotine dependence: Secondary | ICD-10-CM

## 2017-05-18 DIAGNOSIS — Z818 Family history of other mental and behavioral disorders: Secondary | ICD-10-CM | POA: Diagnosis not present

## 2017-05-18 DIAGNOSIS — F331 Major depressive disorder, recurrent, moderate: Secondary | ICD-10-CM | POA: Diagnosis not present

## 2017-05-18 DIAGNOSIS — G5601 Carpal tunnel syndrome, right upper limb: Secondary | ICD-10-CM

## 2017-05-18 MED ORDER — FLUOXETINE HCL 20 MG PO CAPS: 20.0000 mg | ORAL_CAPSULE | Freq: Every day | ORAL | 1 refills | Status: DC
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
## 2017-05-18 NOTE — Patient Instructions (Signed)
STOP Duloxetine  START Prozac 20 mg daily  Orthopedic surgery will call about your wrist and scheduling a visit if you would like

## 2017-05-27 ENCOUNTER — Telehealth: Payer: Self-pay | Admitting: Acute Care

## 2017-05-27 DIAGNOSIS — N63 Unspecified lump in unspecified breast: Secondary | ICD-10-CM

## 2017-05-27 HISTORY — DX: Unspecified lump in unspecified breast: N63.0

## 2017-05-27 NOTE — Telephone Encounter (Signed)
Spoke with patient and informed her that SG wanted a xray prior to her appointment. She was informed to be here at 930 to have xray performed and was given clinic address. She verbalized understanding. Nothing further is needed.

## 2017-05-30 ENCOUNTER — Ambulatory Visit (INDEPENDENT_AMBULATORY_CARE_PROVIDER_SITE_OTHER)
Admission: RE | Admit: 2017-05-30 | Discharge: 2017-05-30 | Disposition: A | Discharge status: Home / Self Care | Payer: 59 | Source: Ambulatory Visit | Attending: Acute Care | Admitting: Acute Care

## 2017-05-30 ENCOUNTER — Ambulatory Visit (INDEPENDENT_AMBULATORY_CARE_PROVIDER_SITE_OTHER): Payer: Medicare Other | Admitting: Acute Care

## 2017-05-30 ENCOUNTER — Encounter: Payer: Self-pay | Admitting: Acute Care

## 2017-05-30 VITALS — BP 100/72 | HR 73 | Ht 67.0 in | Wt 216.6 lb

## 2017-05-30 DIAGNOSIS — M79673 Pain in unspecified foot: Secondary | ICD-10-CM

## 2017-05-30 DIAGNOSIS — D860 Sarcoidosis of lung: Secondary | ICD-10-CM

## 2017-05-30 NOTE — Patient Instructions (Signed)
It is good to see you today. Continue your Zyrtec and Dulera daily. Please schedule an appointment with your eye doctor for an eye exam. Follow up with Dermatologist for rash. Referral to  Pediatrist  for foot pain. Follow up with Dr. Halford Chessman in 3 months. Please contact office for sooner follow up if symptoms do not improve or worsen or seek emergency care

## 2017-05-30 NOTE — Progress Notes (Signed)
I have reviewed and agree with assessment/plan.  Chesley Mires, MD [facility] Pulmonary/Critical Care 05/30/2017, 3:32 PM Pager:  [phone]

## 2017-05-30 NOTE — Progress Notes (Signed)
History of Present Illness Rhonda Hester is a 41 y.o. female current smoker with pulmonary sarcoidosis and chronic asthma. She is followed by Dr.Sood   05/30/2017 Follow Up Ov: Pt. Returns for follow up. She states she is doing well from a breathing standpoint.She is compliant with her Dulera daily, and her Zyrtec daily.She continues to complain of facial rash that itches and rash to her abdomen and beneath her breasts.She states this itches also.She has not had her annual eye exam. She has had 2 near falls due to foot pain. She denies any fever, chest pain, orthopnea or hemoptysis.  Test Results:  CBC Latest Ref Rng & Units 03/18/2017 12/05/2016 09/24/2016  WBC 3.8 - 10.8 K/uL 5.8 6.7 6.2  Hemoglobin 11.7 - 15.5 g/dL 12.7 13.3 12.4  Hematocrit 35.0 - 45.0 % 39.5 39.1 37.0  Platelets 140 - 400 K/uL 284 266 264.0    BMP Latest Ref Rng & Units 03/18/2017 12/05/2016 09/24/2016  Glucose 65 - 99 mg/dL 73 84 74  BUN 7 - 25 mg/dL 14 9 6   Creatinine 0.50 - 1.10 mg/dL 0.66 0.65 0.68  Sodium 135 - 146 mmol/L 138 137 138  Potassium 3.5 - 5.3 mmol/L 4.1 4.1 3.7  Chloride 98 - 110 mmol/L 103 104 105  CO2 20 - 31 mmol/L 25 26 28   Calcium 8.6 - 10.2 mg/dL 9.7 9.7 8.9     PFT    Component Value Date/Time   FEV1PRE 2.42 11/24/2016 1041   FEV1POST 2.52 11/24/2016 1041   FVCPRE 3.09 11/24/2016 1041   FVCPOST 3.13 11/24/2016 1041   TLC 4.65 11/24/2016 1041   DLCOUNC 15.92 11/24/2016 1041   PREFEV1FVCRT 78 11/24/2016 1041   PSTFEV1FVCRT 80 11/24/2016 1041     Past medical hx Past Medical History:  Diagnosis Date  . Allergic asthma   . Eczema   . GERD (gastroesophageal reflux disease)   . History of anal fissures   . History of Bell's palsy    2006  RIGHT SIDE--  RESOLVED  . History of gastroesophageal reflux (GERD)   . Hyperlipidemia   . Interstitial cystitis   . Sarcoidosis of lung ([place])    DX 2006--  PULMOLOGIST--  DR ZESP  . Seasonal allergic rhinitis   . Shortness of breath       Social History  Substance Use Topics  . Smoking status: Former Smoker    Packs/day: 0.30    Years: 6.00    Types: Cigarettes, Cigars    Quit date: 11/2016  . Smokeless tobacco: Never Used     Comment: ONE CIG. DAILY /  1 PPMONTH  . Alcohol use No    Tobacco Cessation: Counseled to quit smoking for 4 minutes this visit  Past surgical hx, Family hx, Social hx all reviewed.  Current Outpatient Prescriptions on File Prior to Visit  Medication Sig  . albuterol (PROAIR HFA) 108 (90 BASE) MCG/ACT inhaler Inhale 2 puffs into the lungs every 6 (six) hours as needed for shortness of breath.  . cetirizine (ZYRTEC) 10 MG tablet Take 10 mg by mouth daily.  . clindamycin (CLEOCIN T) 1 % lotion Apply topically 2 (two) times daily.  Marland Kitchen FLUoxetine (PROZAC) 20 MG capsule Take 1 capsule (20 mg total) by mouth daily.  . LUTEIN PO Take 1 tablet by mouth daily.  . mometasone (NASONEX) 50 MCG/ACT nasal spray Place 2 sprays into the nose daily.  . mometasone-formoterol (DULERA) 100-5 MCG/ACT AERO Inhale 2 puffs into the lungs 2 (two) times daily.  Marland Kitchen  Multiple Vitamin (MULTIVITAMIN WITH MINERALS) TABS tablet Take 1 tablet by mouth daily.  . Multiple Vitamins-Minerals (BODY/HAIR/SKIN/NAILS PO) Take 1 tablet by mouth daily.  . sodium chloride (OCEAN) 0.65 % SOLN nasal spray Place 1 spray into both nostrils as needed for congestion.  Marland Kitchen tretinoin (RETIN-A) 0.05 % cream Apply topically at bedtime.  . predniSONE (DELTASONE) 5 MG tablet Take 1 tablet (5 mg total) by mouth daily with breakfast. (Patient not taking: Reported on 05/30/2017)   Current Facility-Administered Medications on File Prior to Visit  Medication  . bupivacaine (MARCAINE) 0.5 % 15 mL, phenazopyridine (PYRIDIUM) 400 mg bladder mixture  . leuprolide ([name]) injection 11.25 mg     Allergies  Allergen Reactions  . Acyclovir And Related     Patient states it "made me feel like I can't breath"   . Citric Acid Other (See Comments)     AVOIDS DUE TO INTERSTITIAL CYSTITIS  . Dust Mite Extract Itching and Other (See Comments)     coughing  . Grapeseed Extract [Nutritional Supplements]     Throat itching and burning  . Pineapple   . Pollen Extract Itching and Other (See Comments)     coughing  . Nickel Rash    Review Of Systems:  Constitutional:   No  weight loss, night sweats,  Fevers, chills, fatigue, or  lassitude.  HEENT:   No headaches,  Difficulty swallowing,  Tooth/dental problems, or  Sore throat,                No sneezing, itching, ear ache, nasal congestion, post nasal drip,   CV:  No chest pain,  Orthopnea, PND, swelling in lower extremities, anasarca, dizziness, palpitations, syncope.   GI  No heartburn, indigestion, abdominal pain, nausea, vomiting, diarrhea, change in bowel habits, loss of appetite, bloody stools.   Resp: No shortness of breath with exertion or at rest.  No excess mucus, no productive cough,  No non-productive cough,  No coughing up of blood.  No change in color of mucus.  No wheezing.  No chest wall deformity  Skin: no rash or lesions.  GU: no dysuria, change in color of urine, no urgency or frequency.  No flank pain, no hematuria   MS:  No joint pain or swelling.  No decreased range of motion.  No back pain.  Psych:  No change in mood or affect. No depression or anxiety.  No memory loss.   Vital Signs BP 100/72 (BP Location: Left Arm, Patient Position: Sitting, Cuff Size: Normal)   Pulse 73   Ht 5\' 7"  (1.702 m)   Wt 216 lb 9.6 oz (98.2 kg)   SpO2 98%   BMI 33.92 kg/m    Physical Exam:  General- No distress,  A&Ox3 ENT: No sinus tenderness, TM clear, pale nasal mucosa, no oral exudate,no post nasal drip, no LAN Cardiac: S1, S2, regular rate and rhythm, no murmur Chest: No wheeze/ rales/ dullness; no accessory muscle use, no nasal flaring, no sternal retractions Abd.: Soft Non-tender Ext: No clubbing cyanosis, edema Neuro:  normal strength Skin: + rash behind her ears  and under her breasts, skin otherwise warm and dry. Pierced eye brow Psych: normal mood and behavior   Assessment/Plan  Sarcoidosis of lung Stable interval Plan: Continue your Zyrtec and Dulera daily. Please schedule an appointment with your eye doctor for an annual  eye exam. Follow up with Dermatologist for rash. Referral to  Pediatrist  for foot pain. Follow up with Dr. Halford Chessman in 3  months. Please contact office for sooner follow up if symptoms do not improve or worsen or seek emergency care      Magdalen Spatz, NP 05/30/2017  2:05 PM

## 2017-05-30 NOTE — Assessment & Plan Note (Signed)
Stable interval Plan: Continue your Zyrtec and Dulera daily. Please schedule an appointment with your eye doctor for an annual  eye exam. Follow up with Dermatologist for rash. Referral to  Pediatrist  for foot pain. Follow up with Dr. Halford Chessman in 3 months. Please contact office for sooner follow up if symptoms do not improve or worsen or seek emergency care

## 2017-06-01 NOTE — Progress Notes (Signed)
Spoke with patient and informed her of results. She verbalized understanding and did not have any questions. Nothing further is needed.

## 2017-06-03 ENCOUNTER — Encounter (INDEPENDENT_AMBULATORY_CARE_PROVIDER_SITE_OTHER): Payer: Self-pay | Admitting: Orthopaedic Surgery

## 2017-06-03 ENCOUNTER — Ambulatory Visit (INDEPENDENT_AMBULATORY_CARE_PROVIDER_SITE_OTHER): Payer: Medicare Other

## 2017-06-03 ENCOUNTER — Other Ambulatory Visit (INDEPENDENT_AMBULATORY_CARE_PROVIDER_SITE_OTHER): Payer: Self-pay

## 2017-06-03 ENCOUNTER — Ambulatory Visit (INDEPENDENT_AMBULATORY_CARE_PROVIDER_SITE_OTHER): Payer: Medicare Other | Admitting: Orthopaedic Surgery

## 2017-06-03 VITALS — Ht 66.0 in | Wt 214.0 lb

## 2017-06-03 DIAGNOSIS — M5440 Lumbago with sciatica, unspecified side: Secondary | ICD-10-CM

## 2017-06-03 DIAGNOSIS — M79604 Pain in right leg: Secondary | ICD-10-CM | POA: Diagnosis not present

## 2017-06-03 DIAGNOSIS — M79605 Pain in left leg: Secondary | ICD-10-CM | POA: Diagnosis not present

## 2017-06-03 NOTE — Progress Notes (Signed)
Office Visit Note   Patient: Rhonda Hester           Date of Birth: [birth year]           MRN: [id number] Visit Date: 06/03/2017              Requested by: Aundra Dubin, MD [place], [place] [address] PCP: Dorena Dew, FNP   Assessment & Plan: Visit Diagnoses:  1. Low back pain with sciatica, sciatica laterality unspecified, unspecified back pain laterality, unspecified chronicity   2. Pain in left leg   3. Pain in right leg   No specific diagnosis per office visit today  Plan: MRI scan lumbar spine looking for some pathology that may create bilateral leg pain. She seems to have some suggestion of claudication typically when she is up on her feet for a length of time. Would also consider EMGs and nerve conduction studies. Also entertained a diagnosis of fibromyalgia.  Follow-Up Instructions: No Follow-up on file.   Orders:  Orders Placed This Encounter  Procedures  . XR Lumbar Spine 2-3 Views  . XR Pelvis 1-2 Views   No orders of the defined types were placed in this encounter.     Procedures: No procedures performed   Clinical Data: No additional findings.   Subjective: Chief Complaint  Patient presents with  . Right Foot - Pain    Rhonda Hester is a 41 yo that presents with bilateral feet pain. She relates this was sudden onset of the pain on the bottoms of the feet. She also states he almost falls because the pain is too great to stand.  . Left Foot - Pain    She also has calf pain, no fever, denies injury. No lumbar pain, left hip pain, R shoulder pain. She also has sarcoidosis and chronic asthma. She is followed by Dr.Sood  Rhonda Hester relates that she's been experiencing bilateral leg and foot pain for approximately 2 months. She denies any fever or chills. As a problem when she stands or walks for a length of time. Also has trouble when she sleeps. The pain is described as an ache rather than a burning or tingling. She'll  experience in the posterior aspect of both of the legs below her knee and into her feet. There is no redness or ecchymosis. She does have a history of sarcoidosis and being treated that looked our pulmonology. She had been on prednisone but this was discontinued about 3-4 weeks ago. The onset of her symptoms predated discontinuing the prednisone. Her pain was no better or worse while she was on that medicine. She will use a crutch for her ambulation. She is really not experiencing any significant back pain or thigh discomfort. As I review her chart she's had lab studies that were normal except for elevated liver function studies. I'm not sure of the significance. She is not diabetic. Her pulmonary status is under "control". RA factor and sedimentation rate were normal Has been on disability for approximately 10 years based on her respiratory issues.  HPI  Review of Systems   Objective: Vital Signs: Ht 5\' 6"  (1.676 m)   Wt 214 lb (97.1 kg)   BMI 34.54 kg/m   Physical Exam  Ortho Exam straight leg exam is positive only for calf discomfort bilaterally. No thigh or back pain. Reflexes are symmetrical. Skin intact. No swelling in either leg ankle or foot. Number of localized areas of tenderness in the plantar aspect of both  of her feet. Good pulses. Feet were nice and warm. Either medial lateral ankle tendons. Achilles tendon intact. No knee pain. No pain range of motion of either hip. No percussible tenderness lumbar spine. Multiple areas of tenderness along left greater than right scapula. Trigger point areas of tenderness were greater than 10. Experiencing some subjective pain in the left shoulder but impingement and empty can testing were negative. Full overhead motion. Neurovascular exam intact in both upper extremities. No pain with range of motion of cervical spine.  Specialty Comments:  No specialty comments available.  Imaging: No results found.   PMFS History: Patient Active Problem  List   Diagnosis Date Noted  . Depression due to physical illness 04/02/2017  . Pelvic pain 03/28/2017  . Allergic reaction to food 03/19/2017  . Obesity (BMI 30.0-34.9) 03/19/2017  . Generalized pain 03/19/2017  . Metabolic syndrome 07/37/1062  . Medication management 03/19/2017  . Tobacco abuse 01/24/2017  . Uterine fibroid 01/17/2017  . Interstitial cystitis 01/17/2017  . Menorrhagia 12/06/2016  . Acne vulgaris 05/10/2016  . BMI 31.0-31.9,adult 05/10/2016  . Weight gain 05/10/2016  . Right hip pain 10/27/2014  . Sarcoidosis of lung ([place])   . Cervical adenopathy 03/12/2014  . History of smoking 08/07/2013  . Allergic asthma 07/22/2011   Past Medical History:  Diagnosis Date  . Allergic asthma   . Eczema   . GERD (gastroesophageal reflux disease)   . History of anal fissures   . History of Bell's palsy    2006  RIGHT SIDE--  RESOLVED  . History of gastroesophageal reflux (GERD)   . Hyperlipidemia   . Interstitial cystitis   . Sarcoidosis of lung ([place])    DX 2006--  PULMOLOGIST--  DR IRSW  . Seasonal allergic rhinitis   . Shortness of breath     Family History  Problem Relation Age of Onset  . Brain cancer Maternal Grandmother   . Hyperlipidemia Other   . Sleep apnea Other   . Depression Maternal Aunt   . Seizures Maternal Uncle     Past Surgical History:  Procedure Laterality Date  . CYSTO WITH HYDRODISTENSION N/A 08/24/2013   Procedure: CYSTOSCOPY/HYDRODISTENSION with instillation of marcaine and pyridium;  Surgeon: Fredricka Bonine, MD;  Location: [facility];  Service: Urology;  Laterality: N/A;  . CYSTOSCOPY/ HYDRODISTENTION/ BLADDER BX  06-09-2010  . DILATION AND CURETTAGE OF UTERUS  1997   Social History   Occupational History  . Not on file.   Social History Main Topics  . Smoking status: Former Smoker    Packs/day: 0.30    Years: 6.00    Types: Cigarettes, Cigars    Quit date: 11/2016  . Smokeless tobacco: Never Used      Comment: ONE CIG. DAILY /  1 PPMONTH  . Alcohol use No  . Drug use: No     Comment: HX MARIJUANA USE  . Sexual activity: No     Garald Balding, MD   Note - This record has been created using [employer].  Chart creation errors have been sought, but may not always  have been located. Such creation errors do not reflect on  the standard of medical care.

## 2017-06-08 ENCOUNTER — Ambulatory Visit (INDEPENDENT_AMBULATORY_CARE_PROVIDER_SITE_OTHER): Payer: Medicare Other | Admitting: Family Medicine

## 2017-06-08 ENCOUNTER — Encounter: Payer: Self-pay | Admitting: Family Medicine

## 2017-06-08 VITALS — BP 116/70 | HR 92 | Temp 98.6°F | Resp 16 | Ht 66.0 in | Wt 215.0 lb

## 2017-06-08 DIAGNOSIS — R52 Pain, unspecified: Secondary | ICD-10-CM

## 2017-06-08 DIAGNOSIS — R748 Abnormal levels of other serum enzymes: Secondary | ICD-10-CM | POA: Diagnosis not present

## 2017-06-08 LAB — COMPLETE METABOLIC PANEL WITH GFR
ALBUMIN: 3.9 g/dL (ref 3.6–5.1)
ALK PHOS: 140 U/L — AB (ref 33–115)
ALT: 41 U/L — ABNORMAL HIGH (ref 6–29)
AST: 34 U/L — ABNORMAL HIGH (ref 10–30)
BUN: 10 mg/dL (ref 7–25)
CO2: 26 mmol/L (ref 20–31)
Calcium: 9.4 mg/dL (ref 8.6–10.2)
Chloride: 102 mmol/L (ref 98–110)
Creat: 0.61 mg/dL (ref 0.50–1.10)
GFR, Est African American: >89 mL/min (ref >=60)
GLUCOSE: 89 mg/dL (ref 65–99)
POTASSIUM: 4.1 mmol/L (ref 3.5–5.3)
SODIUM: 137 mmol/L (ref 135–146)
Total Bilirubin: 0.8 mg/dL (ref 0.2–1.2)
Total Protein: 7.5 g/dL (ref 6.1–8.1)

## 2017-06-08 MED ORDER — GABAPENTIN 300 MG PO CAPS: 300.0000 mg | ORAL_CAPSULE | Freq: Three times a day (TID) | ORAL | 3 refills | Status: DC

## 2017-06-08 NOTE — Patient Instructions (Signed)
Gabapentin 300 mg every 8 hours for generalized pain. Refrain from drinking, driving,or operating machinery while taking gabapentin.  Gabapentin capsules or tablets What is this medicine? GABAPENTIN (GA ba pen tin) is used to control partial seizures in adults with epilepsy. It is also used to treat certain types of nerve pain. This medicine may be used for other purposes; ask your health care provider or pharmacist if you have questions. COMMON BRAND NAME(S): Active-PAC with Gabapentin, Gabarone, Neurontin What should I tell my health care provider before I take this medicine? They need to know if you have any of these conditions: -kidney disease -suicidal thoughts, plans, or attempt; a previous suicide attempt by you or a family member -an unusual or allergic reaction to gabapentin, other medicines, foods, dyes, or preservatives -pregnant or trying to get pregnant -breast-feeding How should I use this medicine? Take this medicine by mouth with a glass of water. Follow the directions on the prescription label. You can take it with or without food. If it upsets your stomach, take it with food.Take your medicine at regular intervals. Do not take it more often than directed. Do not stop taking except on your doctor's advice. If you are directed to break the 600 or 800 mg tablets in half as part of your dose, the extra half tablet should be used for the next dose. If you have not used the extra half tablet within 28 days, it should be thrown away. A special MedGuide will be given to you by the pharmacist with each prescription and refill. Be sure to read this information carefully each time. Talk to your pediatrician regarding the use of this medicine in children. Special care may be needed. Overdosage: If you think you have taken too much of this medicine contact a poison control center or emergency room at once. NOTE: This medicine is only for you. Do not share this medicine with others. What if  I miss a dose? If you miss a dose, take it as soon as you can. If it is almost time for your next dose, take only that dose. Do not take double or extra doses. What may interact with this medicine? Do not take this medicine with any of the following medications: -other gabapentin products This medicine may also interact with the following medications: -alcohol -antacids -antihistamines for allergy, cough and cold -certain medicines for anxiety or sleep -certain medicines for depression or psychotic disturbances -homatropine; hydrocodone -naproxen -narcotic medicines (opiates) for pain -phenothiazines like chlorpromazine, mesoridazine, prochlorperazine, thioridazine This list may not describe all possible interactions. Give your health care provider a list of all the medicines, herbs, non-prescription drugs, or dietary supplements you use. Also tell them if you smoke, drink alcohol, or use illegal drugs. Some items may interact with your medicine. What should I watch for while using this medicine? Visit your doctor or health care professional for regular checks on your progress. You may want to keep a record at home of how you feel your condition is responding to treatment. You may want to share this information with your doctor or health care professional at each visit. You should contact your doctor or health care professional if your seizures get worse or if you have any new types of seizures. Do not stop taking this medicine or any of your seizure medicines unless instructed by your doctor or health care professional. Stopping your medicine suddenly can increase your seizures or their severity. Wear a medical identification bracelet or chain if you are  taking this medicine for seizures, and carry a card that lists all your medications. You may get drowsy, dizzy, or have blurred vision. Do not drive, use machinery, or do anything that needs mental alertness until you know how this medicine  affects you. To reduce dizzy or fainting spells, do not sit or stand up quickly, especially if you are an older patient. Alcohol can increase drowsiness and dizziness. Avoid alcoholic drinks. Your mouth may get dry. Chewing sugarless gum or sucking hard candy, and drinking plenty of water will help. The use of this medicine may increase the chance of suicidal thoughts or actions. Pay special attention to how you are responding while on this medicine. Any worsening of mood, or thoughts of suicide or dying should be reported to your health care professional right away. Women who become pregnant while using this medicine may enroll in the [place] Pregnancy Registry by calling [phone]. This registry collects information about the safety of antiepileptic drug use during pregnancy. What side effects may I notice from receiving this medicine? Side effects that you should report to your doctor or health care professional as soon as possible: -allergic reactions like skin rash, itching or hives, swelling of the face, lips, or tongue -worsening of mood, thoughts or actions of suicide or dying Side effects that usually do not require medical attention (report to your doctor or health care professional if they continue or are bothersome): -constipation -difficulty walking or controlling muscle movements -dizziness -nausea -slurred speech -tiredness -tremors -weight gain This list may not describe all possible side effects. Call your doctor for medical advice about side effects. You may report side effects to FDA at 1-800-FDA-1088. Where should I keep my medicine? Keep out of reach of children. This medicine may cause accidental overdose and death if it taken by other adults, children, or pets. Mix any unused medicine with a substance like cat litter or coffee grounds. Then throw the medicine away in a sealed container like a sealed bag or a coffee can with a lid. Do not use  the medicine after the expiration date. Store at room temperature between 15 and 30 degrees C (59 and 86 degrees F). NOTE: This sheet is a summary. It may not cover all possible information. If you have questions about this medicine, talk to your doctor, pharmacist, or health care provider.  2018 Elsevier/Gold Standard (2014-02-08 15:26:50)

## 2017-06-10 NOTE — Progress Notes (Addendum)
Rhonda Hester, a 41 year old female with a history of sarcoidosis and generalized pain presents for a 3 month follow up of chronic conditions. Rhonda Hester is complaining of generalized pain. Pain is primarily to left shoulder and bilateral lower extremities. She was recently referred to orthopedic services for left hand pain. However, she was evaluated for low back pain with sciatica and lower extremity pain. She has an MRI scheduled to look for pathology that may create bilateral leg pain. She says that she is unable to sleep throughout the night due to increased pain. She is also ambulating with crutches. She says that it is difficult to apply full weight to lower extremities. She says the onset of symptoms was several months ago.   They are made worse by  kneeling, lying down, raising arm over head, sitting, standing and walking. She has not identified any alleviating factors. Patient denies associated alopecia, fevers, memory loss, morning stiffness, new headache, nodules, oral ulcers, polydypsia, polyuria and rashes/photosensitive.    Past Medical History:  Diagnosis Date  . Allergic asthma   . Eczema   . GERD (gastroesophageal reflux disease)   . History of anal fissures   . History of Bell's palsy    2006  RIGHT SIDE--  RESOLVED  . History of gastroesophageal reflux (GERD)   . Hyperlipidemia   . Interstitial cystitis   . Sarcoidosis of lung ([place])    DX 2006--  PULMOLOGIST--  DR BMWU  . Seasonal allergic rhinitis   . Shortness of breath    Social History   Social History  . Marital status: Single    Spouse name: N/A  . Number of children: N/A  . Years of education: N/A   Occupational History  . Not on file.   Social History Main Topics  . Smoking status: Former Smoker    Packs/day: 0.30    Years: 6.00    Types: Cigarettes, Cigars    Quit date: 11/2016  . Smokeless tobacco: Never Used     Comment: ONE CIG. DAILY /  1 PPMONTH  . Alcohol use No  . Drug use: No     Comment:  HX MARIJUANA USE  . Sexual activity: No   Other Topics Concern  . Not on file   Social History Narrative  . No narrative on file  Review of Systems  Constitutional: Negative.  Negative for fever.  HENT: Negative.   Eyes: Negative.   Respiratory: Negative.   Cardiovascular: Negative for chest pain and palpitations.  Gastrointestinal: Negative for constipation and diarrhea.  Genitourinary: Negative.   Musculoskeletal: Positive for joint pain and myalgias.  Skin: Negative.   Neurological: Negative.   Endo/Heme/Allergies: Negative.   Psychiatric/Behavioral: Negative.    Physical Exam  Constitutional: She is oriented to person, place, and time and well-developed, well-nourished, and in no distress.  HENT:  Head: Normocephalic and atraumatic.  Right Ear: External ear normal.  Left Ear: External ear normal.  Nose: Nose normal.  Mouth/Throat: Oropharynx is clear and moist.  Eyes: Pupils are equal, round, and reactive to light.  Neck: Normal range of motion. Neck supple.  Cardiovascular: Normal rate, regular rhythm, normal heart sounds and intact distal pulses.   Pulmonary/Chest: Effort normal and breath sounds normal.  Abdominal: Soft. Bowel sounds are normal.  Musculoskeletal:       Left shoulder: She exhibits decreased range of motion, tenderness, pain and decreased strength (3/5 strength). She exhibits no swelling and no spasm.       Right knee:  She exhibits decreased range of motion. She exhibits no swelling, no ecchymosis, no deformity and no erythema.       Left knee: She exhibits decreased range of motion. She exhibits no swelling and no erythema.  Neurological: She is alert and oriented to person, place, and time. Gait normal.  Skin: Skin is warm and dry.  Psychiatric: Mood, memory and judgment normal.  Anxiety related to health   BP 116/70 (BP Location: Right Arm, Patient Position: Sitting, Cuff Size: Large)   Pulse 92   Temp 98.6 F (37 C) (Oral)   Resp 16   Ht 5'  6" (1.676 m)   Wt 215 lb (97.5 kg)   SpO2 95%   BMI 34.70 kg/m  Plan   1. Generalized pain Rhonda Hester has pain to multiple areas. She is a poor historian and it is difficult to determine pain source. I suspect that Rhonda Hester may have fibromyalgia.  She is sensitive to palpation primarily along left shoulder and bilateral lower extremities. I will start a trial of gabapentin to assist with wide spread pain.  She also has an MRI scheduled. Patient and I also discussed sedentary lifestyle. She says that she has been attempting to increase daily activity.  - gabapentin (NEURONTIN) 300 MG capsule; Take 1 capsule (300 mg total) by mouth 3 (three) times daily.  Dispense: 90 capsule; Refill: 3  2. Elevated liver enzymes - COMPLETE METABOLIC PANEL WITH GFR  RTC: F/U in 1 month for generalized pain.    [name]  MSN, FNP-C [facility] [address] [place], [place] [address] [phone]

## 2017-06-20 ENCOUNTER — Ambulatory Visit: Payer: Medicare Other | Admitting: Family Medicine

## 2017-06-23 ENCOUNTER — Ambulatory Visit
Admission: RE | Admit: 2017-06-23 | Discharge: 2017-06-23 | Disposition: A | Discharge status: Home / Self Care | Payer: Medicare Other | Source: Ambulatory Visit | Attending: Orthopaedic Surgery | Admitting: Orthopaedic Surgery

## 2017-06-23 DIAGNOSIS — M5440 Lumbago with sciatica, unspecified side: Secondary | ICD-10-CM

## 2017-06-27 ENCOUNTER — Ambulatory Visit: Payer: Medicare Other

## 2017-06-28 ENCOUNTER — Ambulatory Visit (INDEPENDENT_AMBULATORY_CARE_PROVIDER_SITE_OTHER): Payer: Medicare Other | Admitting: Orthopaedic Surgery

## 2017-06-28 ENCOUNTER — Encounter (INDEPENDENT_AMBULATORY_CARE_PROVIDER_SITE_OTHER): Payer: Self-pay | Admitting: Orthopaedic Surgery

## 2017-06-28 ENCOUNTER — Ambulatory Visit (INDEPENDENT_AMBULATORY_CARE_PROVIDER_SITE_OTHER): Payer: Medicare Other

## 2017-06-28 VITALS — BP 118/81 | HR 96 | Ht 66.0 in | Wt 214.0 lb

## 2017-06-28 DIAGNOSIS — G8929 Other chronic pain: Secondary | ICD-10-CM | POA: Diagnosis not present

## 2017-06-28 DIAGNOSIS — M545 Low back pain, unspecified: Secondary | ICD-10-CM

## 2017-06-28 DIAGNOSIS — M25561 Pain in right knee: Secondary | ICD-10-CM

## 2017-06-28 MED ORDER — LIDOCAINE HCL 2 % IJ SOLN
4.0000 mL | INTRAMUSCULAR | Status: AC | PRN
  Administered 2017-06-28: 4 mL

## 2017-06-28 MED ORDER — METHYLPREDNISOLONE ACETATE 40 MG/ML IJ SUSP
80.0000 mg | INTRAMUSCULAR | Status: AC | PRN
  Administered 2017-06-28: 80 mg

## 2017-06-28 MED ORDER — BUPIVACAINE HCL 0.5 % IJ SOLN
3.0000 mL | INTRAMUSCULAR | Status: AC | PRN
  Administered 2017-06-28: 3 mL via INTRA_ARTICULAR

## 2017-06-28 NOTE — Progress Notes (Signed)
Office Visit Note   Patient: Rhonda Hester           Date of Birth: 1976/09/26           MRN: [id number] Visit Date: 06/28/2017              Requested by: Dorena Dew, FNP [address], [place] [address] PCP: Dorena Dew, FNP   Assessment & Plan: Visit Diagnoses:  1. Chronic pain of right knee   2. Chronic bilateral low back pain without sciatica   Low back pain. MRI scan below. Chronic right knee pain could be related to her sarcoidosis and x-rays are negative  Plan: Cortisone injection lateral compartment right knee, follow-up in 2 weeks. No specific treatment for low back pain is presently is asymptomatic.  Follow-Up Instructions: No Follow-up on file.   Orders:  Orders Placed This Encounter  Procedures  . XR KNEE 3 VIEW RIGHT   No orders of the defined types were placed in this encounter.     Procedures: Large Joint Inj Date/Time: 06/28/2017 2:26 PM Performed by: Garald Balding Authorized by: Garald Balding   Consent Given by:  Patient Timeout: prior to procedure the correct patient, procedure, and site was verified   Indications:  Pain and joint swelling Location:  Knee Site:  R knee Prep: patient was prepped and draped in usual sterile fashion   Needle Size:  25 G Needle Length:  1.5 inches Approach:  Anteromedial Ultrasound Guidance: No   Fluoroscopic Guidance: No   Arthrogram: No   Medications:  80 mg methylPREDNISolone acetate 40 MG/ML; 3 mL bupivacaine 0.5 %; 4 mL lidocaine 2 % Aspiration Attempted: No   Patient tolerance:  Patient tolerated the procedure well with no immediate complications     Clinical Data: No additional findings.   Subjective: Chief Complaint  Patient presents with  . Lower Back - Results  Rhonda Hester returns for reevaluation having had an MRI scan of her lumbar spine. There was some marrow changes diffusely consistent with smoking, anemia or obesity. She is overweight. And she  has a history of smoking . Scan did not reveal any evidence of nerve root compression or stenosis. She is feeling better. There is no referred pain to either lower extremity. Rhonda Hester also relates recurrent episodes of right knee pain without injury or trauma. She feels that her knee may be "swollen on occasion". Her knees will be stiff and sometimes it is associated with other joint pain. She's had prior films of both hips without evidence of arthritis  HPI  Review of Systems   Objective: Vital Signs: BP 118/81   Pulse 96   Ht 5\' 6"  (1.676 m)   Wt 214 lb (97.1 kg)   BMI 34.54 kg/m   Physical Exam  Ortho Exam straight leg raise negative bilaterally. Sent motor and sensory exam intact. Painless range of motion both hips. Right knee without an effusion. The knee was not hot red or swollen. Mild lateral joint pain. None medially. No patella crepitation. Normal range of motion without instability. No popliteal pain. No calf discomfort.  Specialty Comments:  No specialty comments available.  Imaging: No results found.   PMFS History: Patient Active Problem List   Diagnosis Date Noted  . Depression due to physical illness 04/02/2017  . Pelvic pain 03/28/2017  . Allergic reaction to food 03/19/2017  . Obesity (BMI 30.0-34.9) 03/19/2017  . Generalized pain 03/19/2017  . Metabolic syndrome 16/38/4665  .  Medication management 03/19/2017  . Tobacco abuse 01/24/2017  . Uterine fibroid 01/17/2017  . Interstitial cystitis 01/17/2017  . Menorrhagia 12/06/2016  . Acne vulgaris 05/10/2016  . BMI 31.0-31.9,adult 05/10/2016  . Weight gain 05/10/2016  . Right hip pain 10/27/2014  . Sarcoidosis of lung ([place])   . Cervical adenopathy 03/12/2014  . History of smoking 08/07/2013  . Allergic asthma 07/22/2011   Past Medical History:  Diagnosis Date  . Allergic asthma   . Eczema   . GERD (gastroesophageal reflux disease)   . History of anal fissures   . History of Bell's palsy     2006  RIGHT SIDE--  RESOLVED  . History of gastroesophageal reflux (GERD)   . Hyperlipidemia   . Interstitial cystitis   . Sarcoidosis of lung ([place])    DX 2006--  PULMOLOGIST--  DR QIWL  . Seasonal allergic rhinitis   . Shortness of breath     Family History  Problem Relation Age of Onset  . Brain cancer Maternal Grandmother   . Hyperlipidemia Other   . Sleep apnea Other   . Depression Maternal Aunt   . Seizures Maternal Uncle     Past Surgical History:  Procedure Laterality Date  . CYSTO WITH HYDRODISTENSION N/A 08/24/2013   Procedure: CYSTOSCOPY/HYDRODISTENSION with instillation of marcaine and pyridium;  Surgeon: Fredricka Bonine, MD;  Location: [facility];  Service: Urology;  Laterality: N/A;  . CYSTOSCOPY/ HYDRODISTENTION/ BLADDER BX  06-09-2010  . DILATION AND CURETTAGE OF UTERUS  1997   Social History   Occupational History  . Not on file.   Social History Main Topics  . Smoking status: Former Smoker    Packs/day: 0.30    Years: 6.00    Types: Cigarettes, Cigars    Quit date: 11/2016  . Smokeless tobacco: Never Used     Comment: ONE CIG. DAILY /  1 PPMONTH  . Alcohol use No  . Drug use: No     Comment: HX MARIJUANA USE  . Sexual activity: No     Garald Balding, MD   Note - This record has been created using [employer].  Chart creation errors have been sought, but may not always  have been located. Such creation errors do not reflect on  the standard of medical care.

## 2017-07-04 ENCOUNTER — Ambulatory Visit: Payer: Medicare Other | Admitting: Pulmonary Disease

## 2017-07-06 ENCOUNTER — Encounter: Payer: Medicare Other | Attending: Family Medicine | Admitting: Registered"

## 2017-07-06 DIAGNOSIS — Z6831 Body mass index (BMI) 31.0-31.9, adult: Secondary | ICD-10-CM | POA: Insufficient documentation

## 2017-07-06 DIAGNOSIS — Z713 Dietary counseling and surveillance: Secondary | ICD-10-CM | POA: Diagnosis present

## 2017-07-06 DIAGNOSIS — E669 Obesity, unspecified: Secondary | ICD-10-CM

## 2017-07-06 NOTE — Progress Notes (Signed)
Medical Nutrition Therapy:  Appt start time: 1010 end time:  8502.   Assessment:  Primary concerns today: Pt referred for obesity. Follow-Up: Pt says nothing has changed since last visit regarding her diet or weight. Pt says she would like to know the signs of diabetes because she is concerned because her weight has not changed and she thinks her vision gets blurry sometimes when she eats foods high in sugar. Pt says she has not experienced any changes in thirst or urination, but she is often tired. Pt says she does not think she gets enough fluids. Pt says she has been having issues with having some loose stools in her clothing but she does not think it is related to what/when she eats. Pt denies any GI problems following meals/after eating. Pt says her bladder irritation from the interstitial cystitis has been better since last appointment. Pt says she is still skipping meals and says she does not feel like preparing or eating dinner when she gets home from work. Pt says she has been seeing a counselor and she feels getting out more has helped reduce her stress.   Preferred Learning Style:  No preference indicated   Learning Readiness:  Contemplating  MEDICATIONS: See list.    DIETARY INTAKE:  Usual eating pattern includes 1 meal and several snacks per day.  Everyday foods include chicken, salad, cereal, eggs, [place] sausage.  Avoided foods include spicy foods, pineapple, grapes, and tomatoes.  Pt says fresh pineapple makes her face burn and eating raw, whole grapes makes her throat swell.   Pt says she often does not feel like fixing dinner when she gets home from work (work schedule:11-7 pm) and so she often just grabs something to eat before going to bed.   24-hr recall:  B ( AM): 1 cup coffee with cream Snk ( AM): None reported.  L ( PM):  2 vegetable egg rolls, 2 crackers, popsicles  Snk ( PM): None reported.  D ( PM): Pot pie with [place] and vegetables, smoothie (strawberries,  pineapple, mango, peaches, juice, apple sauce) Snk ( PM): None reported.  Beverages: water  Pt says meals are usually eaten on the sofa with the TV on. Pt says she sometimes eats fast.   Usual physical activity: Pt says she always hurts afterward if she goes to the gym. Pt says she has a treadmill at home that she could start using to walk more.   Progress Towards Goal(s):  In progress.   Nutritional Diagnosis:  NI-5.11.1 Predicted suboptimal nutrient intake As related to inconsistent and unbalanced meals.  As evidenced by pt's diet recall.    Intervention:  Nutrition counseling provided. Dietitian counseled pt regarding the importance of eating consistent and balanced meals and how skipping meals can lead to slowed metabolism and weight gain. Dietitian discussed with pt that eating whole foods over protein smoothies or meal replacement shakes is preferred but that drinking a meal replacement/protein shake is better than skipping a meal. Dietitian discussed strategies to make getting in three meals easier for pt such as having easy to prepare/ready to eat foods in the refrigerator ahead of time for breakfast and dinner as those are the two meals pt struggles with the most. Dietitian discussed common signs of diabetes with pt. Dietitian encouraged pt to discuss trouble with having loose stools in clothing with her doctor. Dietitian encouraged pt to continue attending counseling appointments. Dietitian encouraged pt to follow her doctor's recommendations regarding physical activity.   Goals:  Try to get in three meals per day. For breakfast and dinner try to have something easy to prepare or ready to eat in the fridge in the morning and when you get home after work to help prevent you from skipping meals. Getting in regular meals will help provide the body with adequate energy it needs and keep metabolism at a healthy rate.   Try to have balanced meals like the plate example.   Try to continue  working toward getting in some physical activity as recommended by your doctor.   Teaching Method Utilized: Auditory  Barriers to learning/adherence to lifestyle change: None indicated.   Demonstrated degree of understanding via:  Teach Back   Monitoring/Evaluation:  Dietary intake, exercise, and body weight in 3 month(s).

## 2017-07-06 NOTE — Patient Instructions (Signed)
Try to get in three meals per day. For breakfast and dinner try to have something easy to prepare or ready to eat in the fridge in the morning and when you get home after work to help prevent you from skipping meals.  Getting in regular meals will help provide the body with adequate energy it needs and keep metabolism at a healthy rate.   Try to have balanced meals like the plate example.   Try to continue working toward getting in some physical activity as recommended by your doctor.

## 2017-07-11 ENCOUNTER — Ambulatory Visit (INDEPENDENT_AMBULATORY_CARE_PROVIDER_SITE_OTHER): Payer: Medicare Other | Admitting: Orthopaedic Surgery

## 2017-07-21 ENCOUNTER — Ambulatory Visit (HOSPITAL_COMMUNITY): Payer: 59 | Admitting: Psychiatry

## 2017-07-29 ENCOUNTER — Ambulatory Visit: Payer: Medicare Other

## 2017-07-29 VITALS — BP 112/79 | HR 70 | Wt 208.0 lb

## 2017-07-29 DIAGNOSIS — N809 Endometriosis, unspecified: Secondary | ICD-10-CM

## 2017-07-29 MED ORDER — LEUPROLIDE ACETATE 3.75 MG IM KIT: 3.7500 mg | PACK | Freq: Once | INTRAMUSCULAR | Status: DC

## 2017-07-29 NOTE — Progress Notes (Signed)
Patient presents for Depo [name]. Given in [place]. Tolerated well.  Administrations This Visit    leuprolide ([name]) injection 11.25 mg    Admin Date 07/29/2017 Action Given Dose 11.25 mg Route Intramuscular Administered By Tamela Oddi, RMA

## 2017-08-01 ENCOUNTER — Encounter (INDEPENDENT_AMBULATORY_CARE_PROVIDER_SITE_OTHER): Payer: Self-pay | Admitting: Orthopaedic Surgery

## 2017-08-01 ENCOUNTER — Ambulatory Visit (INDEPENDENT_AMBULATORY_CARE_PROVIDER_SITE_OTHER): Payer: Medicare Other | Admitting: Orthopaedic Surgery

## 2017-08-01 VITALS — BP 108/72 | HR 82 | Resp 14 | Ht 66.0 in | Wt 208.0 lb

## 2017-08-01 DIAGNOSIS — M25561 Pain in right knee: Secondary | ICD-10-CM | POA: Diagnosis not present

## 2017-08-01 DIAGNOSIS — G8929 Other chronic pain: Secondary | ICD-10-CM | POA: Insufficient documentation

## 2017-08-01 DIAGNOSIS — M545 Low back pain, unspecified: Secondary | ICD-10-CM | POA: Insufficient documentation

## 2017-08-01 NOTE — Progress Notes (Signed)
Office Visit Note   Patient: Rhonda Hester           Date of Birth: May 27, 1976           MRN: [id number] Visit Date: 08/01/2017              Requested by: Rhonda Dew, FNP [address], [place] [address] PCP: Rhonda Dew, FNP   Assessment & Plan: Visit Diagnoses: Musculoligamentous low back pain. Right knee pain possibly related to osteoarthritis and sarcoid  Plan: Long discussion regarding treatment options. Presently doing well with the right knee. I she is seeing a dietitian for weight loss. Suggested a course of exercises for her low back and we'll provide exercises and plan to see her back as needed  Follow-Up Instructions: No Follow-up on file.   Orders:  No orders of the defined types were placed in this encounter.  No orders of the defined types were placed in this encounter.     Procedures: No procedures performed   Clinical Data: No additional findings.   Subjective: Chief Complaint  Patient presents with  . Right Shoulder - Pain    [name] is a 41 y o that is here for a follow up of R knee. The injection really helped her pain.  Status was recently seen for evaluation of low back pain and right knee discomfort. Films of her knee were negative. I did inject the knee with cortisone it's made a big difference. She has a history of sarcoidosis. She relates her back pain is present when she gets up from a sitting position or even in the morning when she first gets out of bed. With exercises and stretching and actually feels better. No referred pain to either buttock or either lower extremity. She denies any numbness or tingling shortness of breath or chest pain. No fever or chills  HPI  Review of Systems  Constitutional: Negative for chills, fatigue and fever.  Eyes: Negative for itching.  Respiratory: Negative for chest tightness and shortness of breath.   Cardiovascular: Negative for chest pain, palpitations and leg  swelling.  Gastrointestinal: Positive for blood in stool. Negative for constipation and diarrhea.  Musculoskeletal: Positive for back pain. Negative for joint swelling, neck pain and neck stiffness.  Neurological: Negative for dizziness and numbness.  Hematological: Does not bruise/bleed easily.  Psychiatric/Behavioral: The patient is not nervous/anxious.      Objective: Vital Signs: BP 108/72   Pulse 82   Resp 14   Ht 5\' 6"  (1.676 m)   Wt 208 lb (94.3 kg)   BMI 33.57 kg/m   Physical Exam  Ortho Exam straight leg raise negative bilaterally reflexes symmetrical. No effusion right knee. No significant joint pain or patellar discomfort. Full extension and flexed over 105 without instability. No distal edema. Skin intact. Neurovascular exam intact. Straight leg raise negative. Painless range of motion both hips. No percussible tenderness lumbosacral spine  Specialty Comments:  No specialty comments available.  Imaging: No results found.   PMFS History: Patient Active Problem List   Diagnosis Date Noted  . Depression due to physical illness 04/02/2017  . Pelvic pain 03/28/2017  . Allergic reaction to food 03/19/2017  . Obesity (BMI 30.0-34.9) 03/19/2017  . Generalized pain 03/19/2017  . Metabolic syndrome 42/70/6237  . Medication management 03/19/2017  . Tobacco abuse 01/24/2017  . Uterine fibroid 01/17/2017  . Interstitial cystitis 01/17/2017  . Menorrhagia 12/06/2016  . Acne vulgaris 05/10/2016  . BMI  31.0-31.9,adult 05/10/2016  . Weight gain 05/10/2016  . Right hip pain 10/27/2014  . Sarcoidosis of lung ([place])   . Cervical adenopathy 03/12/2014  . History of smoking 08/07/2013  . Allergic asthma 07/22/2011   Past Medical History:  Diagnosis Date  . Allergic asthma   . Eczema   . GERD (gastroesophageal reflux disease)   . History of anal fissures   . History of Bell's palsy    2006  RIGHT SIDE--  RESOLVED  . History of gastroesophageal reflux (GERD)   .  Hyperlipidemia   . Interstitial cystitis   . Sarcoidosis of lung ([place])    DX 2006--  PULMOLOGIST--  DR TMYT  . Seasonal allergic rhinitis   . Shortness of breath     Family History  Problem Relation Age of Onset  . Brain cancer Maternal Grandmother   . Hyperlipidemia Other   . Sleep apnea Other   . Depression Maternal Aunt   . Seizures Maternal Uncle     Past Surgical History:  Procedure Laterality Date  . CYSTO WITH HYDRODISTENSION N/A 08/24/2013   Procedure: CYSTOSCOPY/HYDRODISTENSION with instillation of marcaine and pyridium;  Surgeon: Fredricka Bonine, MD;  Location[facility];  Service: Urology;  Laterality: N/A;  . CYSTOSCOPY/ HYDRODISTENTION/ BLADDER BX  06-09-2010  . DILATION AND CURETTAGE OF UTERUS  1997   Social History   Occupational History  . Not on file.   Social History Main Topics  . Smoking status: Former Smoker    Packs/day: 0.30    Years: 6.00    Types: Cigarettes, Cigars    Quit date: 11/2016  . Smokeless tobacco: Never Used     Comment: ONE CIG. DAILY /  1 PPMONTH  . Alcohol use No  . Drug use: No     Comment: HX MARIJUANA USE  . Sexual activity: No

## 2017-08-01 NOTE — Patient Instructions (Signed)

## 2017-08-09 ENCOUNTER — Encounter: Payer: Self-pay | Admitting: Family Medicine

## 2017-08-09 ENCOUNTER — Ambulatory Visit (INDEPENDENT_AMBULATORY_CARE_PROVIDER_SITE_OTHER): Payer: Medicare Other | Admitting: Family Medicine

## 2017-08-09 VITALS — BP 127/80 | HR 67 | Temp 98.7°F | Resp 14 | Ht 66.0 in | Wt 206.2 lb

## 2017-08-09 DIAGNOSIS — N644 Mastodynia: Secondary | ICD-10-CM | POA: Diagnosis not present

## 2017-08-09 DIAGNOSIS — Z131 Encounter for screening for diabetes mellitus: Secondary | ICD-10-CM

## 2017-08-09 DIAGNOSIS — Z13220 Encounter for screening for lipoid disorders: Secondary | ICD-10-CM

## 2017-08-09 DIAGNOSIS — K625 Hemorrhage of anus and rectum: Secondary | ICD-10-CM | POA: Diagnosis not present

## 2017-08-09 LAB — POCT URINALYSIS DIP (DEVICE)
Bilirubin Urine: NEGATIVE
Glucose, UA: NEGATIVE mg/dL
HGB URINE DIPSTICK: NEGATIVE
KETONES UR: NEGATIVE mg/dL
Leukocytes, UA: NEGATIVE
Nitrite: NEGATIVE
PH: 7 (ref 5.0–8.0)
PROTEIN: NEGATIVE mg/dL
Specific Gravity, Urine: 1.015 (ref 1.005–1.030)
Urobilinogen, UA: 4 mg/dL — ABNORMAL HIGH (ref 0.0–1.0)

## 2017-08-09 LAB — CBC WITH DIFFERENTIAL/PLATELET
Basophils Absolute: 50 cells/uL (ref 0–200)
Basophils Relative: 1 %
EOS PCT: 5 %
Eosinophils Absolute: 250 cells/uL (ref 15–500)
HEMATOCRIT: 35.3 % (ref 35.0–45.0)
Hemoglobin: 11.3 g/dL — ABNORMAL LOW (ref 11.7–15.5)
LYMPHS PCT: 27 %
Lymphs Abs: 1350 cells/uL (ref 850–3900)
MCH: 25.2 pg — ABNORMAL LOW (ref 27.0–33.0)
MCHC: 32 g/dL (ref 32.0–36.0)
MCV: 78.8 fL — ABNORMAL LOW (ref 80.0–100.0)
MONOS PCT: 10 %
MPV: 9.1 fL (ref 7.5–12.5)
Monocytes Absolute: 500 cells/uL (ref 200–950)
NEUTROS PCT: 57 %
Neutro Abs: 2850 cells/uL (ref 1500–7800)
PLATELETS: 297 10*3/uL (ref 140–400)
RBC: 4.48 MIL/uL (ref 3.80–5.10)
RDW: 14.4 % (ref 11.0–15.0)
WBC: 5 10*3/uL (ref 3.8–10.8)

## 2017-08-09 MED ORDER — HYDROCORTISONE ACETATE 25 MG RE SUPP: 25.0000 mg | Freq: Two times a day (BID) | RECTAL | 0 refills | Status: DC

## 2017-08-09 NOTE — Progress Notes (Signed)
Patient ID: Clare Fennimore, female    DOB: 1976-03-03, 41 y.o.   MRN: [id number]  PCP: Dorena Dew, FNP  Chief Complaint  Patient presents with  . Follow-up    2 MONTH  . Breast Pain    Under left breast  . Rectal Bleeding    Subjective:  HPI Eola Waldrep is a 41 y.o. female presents for evaluation of left breast pain and rectal bleeding.  Medical problems include: Sarcoidosis of lung, Asthma, and Depression.  Irie complains today of left breast painful nodule present for over the last 2 months.  She reports that the nodule is located at the 4 o'clock position under her areole of the left breast. She reports a history of breast abscess which required incision and drainage. She denies associated skin erythema or increased warmth. She is current with her mammogram screening and denies a family history of BCA. Litisha also complains of recently noticing blood after defecation when wiping. She reports noticing what she describes as "extra skin" when wiping her skin. Claretha also complains of excess stool remaining in her rectum post defecation resulting in soiling of underwear. She has no known history if hemorrhoids or prior episodes of rectal bleeding.  Social History   Social History  . Marital status: Single    Spouse name: N/A  . Number of children: N/A  . Years of education: N/A   Occupational History  . Not on file.   Social History Main Topics  . Smoking status: Former Smoker    Packs/day: 0.30    Years: 6.00    Types: Cigarettes, Cigars    Quit date: 11/2016  . Smokeless tobacco: Never Used     Comment: ONE CIG. DAILY /  1 PPMONTH  . Alcohol use No  . Drug use: No     Comment: HX MARIJUANA USE  . Sexual activity: No   Other Topics Concern  . Not on file   Social History Narrative  . No narrative on file    Family History  Problem Relation Age of Onset  . Brain cancer Maternal Grandmother   . Hyperlipidemia Other   . Sleep apnea Other   .  Depression Maternal Aunt   . Seizures Maternal Uncle    Review of Systems See HPI  Patient Active Problem List   Diagnosis Date Noted  . Chronic bilateral low back pain without sciatica 08/01/2017  . Chronic pain of right knee 08/01/2017  . Depression due to physical illness 04/02/2017  . Pelvic pain 03/28/2017  . Allergic reaction to food 03/19/2017  . Obesity (BMI 30.0-34.9) 03/19/2017  . Generalized pain 03/19/2017  . Metabolic syndrome 87/56/4332  . Medication management 03/19/2017  . Tobacco abuse 01/24/2017  . Uterine fibroid 01/17/2017  . Interstitial cystitis 01/17/2017  . Menorrhagia 12/06/2016  . Acne vulgaris 05/10/2016  . BMI 31.0-31.9,adult 05/10/2016  . Weight gain 05/10/2016  . Right hip pain 10/27/2014  . Sarcoidosis of lung ([place])   . Cervical adenopathy 03/12/2014  . History of smoking 08/07/2013  . Allergic asthma 07/22/2011    Allergies  Allergen Reactions  . Acyclovir And Related     Patient states it "made me feel like I can't breath"   . Citric Acid Other (See Comments)    AVOIDS DUE TO INTERSTITIAL CYSTITIS  . Dust Mite Extract Itching and Other (See Comments)     coughing  . Grapeseed Extract [Nutritional Supplements]     Throat itching and burning  . Pineapple   .  Pollen Extract Itching and Other (See Comments)     coughing  . Nickel Rash    Prior to Admission medications   Medication Sig Start Date End Date Taking? Authorizing Provider  albuterol (PROAIR HFA) 108 (90 BASE) MCG/ACT inhaler Inhale 2 puffs into the lungs every 6 (six) hours as needed for shortness of breath. 05/15/15  Yes Chesley Mires, MD  cetirizine (ZYRTEC) 10 MG tablet Take 10 mg by mouth daily.   Yes [provider]  FLUoxetine (PROZAC) 20 MG capsule Take 1 capsule (20 mg total) by mouth daily. 05/18/17 05/18/18 Yes Eksir, Richard Miu, MD  gabapentin (NEURONTIN) 300 MG capsule Take 1 capsule (300 mg total) by mouth 3 (three) times daily. 06/08/17  Yes Dorena Dew, FNP  ibuprofen (ADVIL,MOTRIN) 600 MG tablet  05/19/17  Yes [provider]  LUTEIN PO Take 1 tablet by mouth daily.   Yes [provider]  mometasone (NASONEX) 50 MCG/ACT nasal spray Place 2 sprays into the nose daily.   Yes [provider]  mometasone-formoterol (DULERA) 100-5 MCG/ACT AERO Inhale 2 puffs into the lungs 2 (two) times daily. 05/15/15  Yes Chesley Mires, MD  Multiple Vitamin (MULTIVITAMIN WITH MINERALS) TABS tablet Take 1 tablet by mouth daily.   Yes [provider]  Multiple Vitamins-Minerals (BODY/HAIR/SKIN/NAILS PO) Take 1 tablet by mouth daily.   Yes [provider]  predniSONE (DELTASONE) 5 MG tablet Take 1 tablet (5 mg total) by mouth daily with breakfast. 04/27/17  Yes Magdalen Spatz, NP  sodium chloride (OCEAN) 0.65 % SOLN nasal spray Place 1 spray into both nostrils as needed for congestion.   Yes [provider]  tretinoin (RETIN-A) 0.05 % cream Apply topically at bedtime.   Yes [provider]  clindamycin (CLEOCIN T) 1 % lotion Apply topically 2 (two) times daily.    [provider]    Past Medical, Surgical Family and Social History reviewed and updated.    Objective:   Today's Vitals   08/09/17 0941  BP: 127/80  Pulse: 67  Resp: 14  Temp: 98.7 F (37.1 C)  TempSrc: Oral  SpO2: 96%  Weight: 206 lb 3.2 oz (93.5 kg)  Height: 5\' 6"  (1.676 m)    Wt Readings from Last 3 Encounters:  08/09/17 206 lb 3.2 oz (93.5 kg)  08/01/17 208 lb (94.3 kg)  07/29/17 208 lb (94.3 kg)   Physical Exam  Constitutional: She is oriented to person, place, and time. She appears well-developed and well-nourished.  HENT:  Head: Normocephalic and atraumatic.  Cardiovascular: Normal rate, regular rhythm, normal heart sounds and intact distal pulses.   Pulmonary/Chest: Effort normal and breath sounds normal.    Neurological: She is alert and oriented to person, place, and time.  Skin: Skin is warm  and dry.  Psychiatric: She has a normal mood and affect. Her behavior is normal. Judgment and thought content normal.   Assessment & Plan:  1. Breast tenderness in female, will evaluate source of localized tenderness with a diagnostic mammogram.  - MM Digital Diagnostic Unilat L; Future  2. Rectal bleeding, rectal exam revealed small, hemorrhoid at the sphincter of the rectum which is likely the cause of occasional rectal bleeding. In office FOBT was negative. -Will trial patient with hydrocortisone 25 mg, 2 times daily, suppositories.    -Encouraged to add fiber rich foods to diet (recommended 25 grams daily) and increase water intake 6-8 glass per day.  3. Screening for diabetes mellitus - COMPLETE METABOLIC  PANEL WITH GFR - Hemoglobin A1c  4. Screening, lipid - Lipid panel  RTC: 6 months with Cammie Sickle, FNP-C   Carroll Sage. Kenton Kingfisher, MSN, FNP-C [facility]  [address] [place], [place] [address] [phone]

## 2017-08-10 LAB — COMPLETE METABOLIC PANEL WITH GFR
ALT: 27 U/L (ref 6–29)
AST: 27 U/L (ref 10–30)
Albumin: 3.6 g/dL (ref 3.6–5.1)
Alkaline Phosphatase: 144 U/L — ABNORMAL HIGH (ref 33–115)
BUN: 9 mg/dL (ref 7–25)
CALCIUM: 8.9 mg/dL (ref 8.6–10.2)
CO2: 21 mmol/L (ref 20–32)
Chloride: 104 mmol/L (ref 98–110)
Creat: 0.65 mg/dL (ref 0.50–1.10)
GFR, Est African American: >89 mL/min (ref >=60)
GFR, Est Non African American: >89 mL/min (ref >=60)
Glucose, Bld: 84 mg/dL (ref 65–99)
Potassium: 4.1 mmol/L (ref 3.5–5.3)
Sodium: 140 mmol/L (ref 135–146)
Total Bilirubin: 0.6 mg/dL (ref 0.2–1.2)
Total Protein: 7.5 g/dL (ref 6.1–8.1)

## 2017-08-10 LAB — LIPID PANEL
CHOL/HDL RATIO: 4 ratio (ref <5.0)
CHOLESTEROL: 152 mg/dL (ref <200)
HDL: 38 mg/dL — ABNORMAL LOW (ref >50)
LDL Cholesterol: 96 mg/dL (ref <100)
TRIGLYCERIDES: 90 mg/dL (ref <150)
VLDL: 18 mg/dL (ref <30)

## 2017-08-10 LAB — HEMOGLOBIN A1C
Hgb A1c MFr Bld: 5.6 % (ref <5.7)
MEAN PLASMA GLUCOSE: 114 mg/dL

## 2017-08-14 ENCOUNTER — Telehealth: Payer: Self-pay | Admitting: Family Medicine

## 2017-08-14 NOTE — Telephone Encounter (Signed)
Morey Hummingbird,  Could you please schedule a diagnostic mammogram of the left breast for this patient at the [facility].   Carroll Sage. Kenton Kingfisher, MSN, FNP-C [facility]  [address] [place] [address] [place], [place] [address] [phone]

## 2017-08-15 ENCOUNTER — Other Ambulatory Visit: Payer: Self-pay | Admitting: Family Medicine

## 2017-08-15 ENCOUNTER — Other Ambulatory Visit: Payer: Self-pay

## 2017-08-15 DIAGNOSIS — N644 Mastodynia: Secondary | ICD-10-CM

## 2017-08-15 NOTE — Telephone Encounter (Signed)
Got patient schedule for 08/18/2017 and she states that she will be out of town. Patient will [facility] and schedule a time that works for her.

## 2017-08-16 ENCOUNTER — Ambulatory Visit (HOSPITAL_COMMUNITY): Payer: Self-pay | Admitting: Psychiatry

## 2017-08-17 ENCOUNTER — Telehealth: Payer: Self-pay

## 2017-08-18 ENCOUNTER — Other Ambulatory Visit: Payer: Self-pay

## 2017-08-19 NOTE — Telephone Encounter (Signed)
Called and spoke with patient, advised that labs were back and if any changes needed to be made to medication regiment we would give her a call back. Patient verbalized understanding. Thanks!

## 2017-08-23 ENCOUNTER — Ambulatory Visit
Admission: RE | Admit: 2017-08-23 | Discharge: 2017-08-23 | Disposition: A | Discharge status: Home / Self Care | Payer: Medicare Other | Source: Ambulatory Visit | Attending: Family Medicine | Admitting: Family Medicine

## 2017-08-23 DIAGNOSIS — N644 Mastodynia: Secondary | ICD-10-CM

## 2017-08-23 HISTORY — DX: Unspecified lump in unspecified breast: N63.0

## 2017-09-07 ENCOUNTER — Other Ambulatory Visit (HOSPITAL_COMMUNITY): Payer: Self-pay | Admitting: Psychiatry

## 2017-09-07 DIAGNOSIS — F331 Major depressive disorder, recurrent, moderate: Secondary | ICD-10-CM

## 2017-09-20 ENCOUNTER — Encounter: Payer: Self-pay | Admitting: Pulmonary Disease

## 2017-09-20 ENCOUNTER — Ambulatory Visit (INDEPENDENT_AMBULATORY_CARE_PROVIDER_SITE_OTHER): Payer: Medicare Other | Admitting: Pulmonary Disease

## 2017-09-20 VITALS — BP 118/74 | HR 73 | Ht 66.0 in | Wt 200.0 lb

## 2017-09-20 DIAGNOSIS — B0229 Other postherpetic nervous system involvement: Secondary | ICD-10-CM | POA: Diagnosis not present

## 2017-09-20 DIAGNOSIS — D860 Sarcoidosis of lung: Secondary | ICD-10-CM | POA: Diagnosis not present

## 2017-09-20 DIAGNOSIS — Z23 Encounter for immunization: Secondary | ICD-10-CM

## 2017-09-20 DIAGNOSIS — J4530 Mild persistent asthma, uncomplicated: Secondary | ICD-10-CM

## 2017-09-20 MED ORDER — MOMETASONE FURO-FORMOTEROL FUM 100-5 MCG/ACT IN AERO: 2.0000 | INHALATION_SPRAY | Freq: Two times a day (BID) | RESPIRATORY_TRACT | 5 refills | Status: DC

## 2017-09-20 NOTE — Patient Instructions (Signed)
Dulera two puffs twice per day, and rinse mouth after each use  Flu shot today  Follow up in 4 months

## 2017-09-20 NOTE — Progress Notes (Signed)
Current Outpatient Prescriptions on File Prior to Visit  Medication Sig  . albuterol (PROAIR HFA) 108 (90 BASE) MCG/ACT inhaler Inhale 2 puffs into the lungs every 6 (six) hours as needed for shortness of breath.  Marland Kitchen FLUoxetine (PROZAC) 20 MG capsule TAKE 1 CAPSULE BY MOUTH ONCE DAILY  . gabapentin (NEURONTIN) 300 MG capsule Take 1 capsule (300 mg total) by mouth 3 (three) times daily.  . hydrocortisone (ANUSOL-HC) 25 MG suppository Place 1 suppository (25 mg total) rectally 2 (two) times daily.  Marland Kitchen ibuprofen (ADVIL,MOTRIN) 600 MG tablet   . LUTEIN PO Take 1 tablet by mouth daily.  . mometasone (NASONEX) 50 MCG/ACT nasal spray Place 2 sprays into the nose daily.  . sodium chloride (OCEAN) 0.65 % SOLN nasal spray Place 1 spray into both nostrils as needed for congestion.   Current Facility-Administered Medications on File Prior to Visit  Medication  . bupivacaine (MARCAINE) 0.5 % 15 mL, phenazopyridine (PYRIDIUM) 400 mg bladder mixture  . leuprolide ([name]) injection 11.25 mg  . leuprolide ([name]) injection 3.75 mg    Chief Complaint  Patient presents with  . Follow-up    Pt is not feeling well, having severe pain on left lung and chest for last couple months pain is 9; hurts to touch. Pt has productive cough, most times is light green mucus and has some wheezing with some SOB. Pt requests flu shot today.     Pulmonary tests RAST 07/22/11 >> Multiple allergens, IgE 71.8  PFT 09/02/11 >> FEV1 2.80(93%), FEV1% 82, TLC 4.74(89%), DLCO 59%, no BD PFT 09/25/13 >> FEV1 2.75 (95%), FEV1% 81, TLC 4.25 (77%), DLCO 64%, no BD ACE 09/24/16 >> 219 PFT 11/24/16 >> FEV1 2.52 (89%), FEV1% 80, TLC 4.65 (84%), DLCO 56%, no BD ACE 04/25/17 >> 233  CT chests CT chest 12/30/04 >> b/l hilar and mediastinal adenopathy with b/l nodular interstitial opacities  CT chest 07/08/08 >> decreased lymph nodes, scarring with volume loss  CT chest 11/20/13 >>  Scattered areas of ATX CT chest 04/02/14 >> calcified Rt  hilar LAN, scattered areas of scarring CT chest 10/01/16 >> stable scarring and traction BTX  Past medical history GERD, HLD  Past surgical history, Family history, Social history, Allergies reviewed  Vital signs BP 118/74 (BP Location: Left Arm, Cuff Size: Normal)   Pulse 73   Ht 5\' 6"  (1.676 m)   Wt 200 lb (90.7 kg)   SpO2 96%   BMI 32.28 kg/m    History of Present Illness: Rhonda Hester is a 41 y.o. female smoker with asthma, allergic rhinitis, and sarcoidosis (pulmonary, skin involvement, Bell's palsy) firsts dx in 2006.  She is not using inhalers, and is off prednisone.  She no longer smokes cigarettes.  She is getting cough with green sputum.  She has hx of shingles.  She has pain in her left rib cage that follows dermatomal pattern.  She is not having rash.  She gets hot flashes >> on lupron for fibroids.  Denies fever.  Sinuses okay.  Gets fullness in her Lt upper abdomen sporadically.  Physical Exam:  General - pleasant Eyes - pupils reactive ENT - no sinus tenderness, no oral exudate, no LAN Cardiac - regular, no murmur Chest - no wheeze, rales Abd - soft, non tender, no organomegaly Ext - no edema Skin - no rashes, tender on palpation in Lt chest area Neuro - normal strength Psych - normal mood   Assessment/Plan:  Pulmonary sarcoidosis. - if symptoms persists after restarting inhalers,  then consider restarting prednisone  Upper airway cough syndrome from post nasal drip. - prn nasacort  Allergic asthma. - resume dulera  Tobacco abuse. - congratulated her on smoking cessation  Post-herpetic neuralgia. - explained how her pain symptoms are likely not related to sarcoidosis - advised her to f/u with her PCP   Patient Instructions  Dulera two puffs twice per day, and rinse mouth after each use  Flu shot today  Follow up in 4 months  Time spent 27 minutes  Chesley Mires, MD [facility] Pager:   [phone] 09/20/2017, 11:02 AM

## 2017-09-21 ENCOUNTER — Encounter: Payer: Self-pay | Admitting: Family Medicine

## 2017-09-21 ENCOUNTER — Ambulatory Visit (INDEPENDENT_AMBULATORY_CARE_PROVIDER_SITE_OTHER): Payer: Medicare Other | Admitting: Family Medicine

## 2017-09-21 VITALS — BP 139/86 | HR 92 | Temp 98.1°F | Resp 16 | Ht 66.0 in | Wt 202.0 lb

## 2017-09-21 DIAGNOSIS — B0229 Other postherpetic nervous system involvement: Secondary | ICD-10-CM

## 2017-09-21 DIAGNOSIS — R52 Pain, unspecified: Secondary | ICD-10-CM

## 2017-09-21 DIAGNOSIS — N644 Mastodynia: Secondary | ICD-10-CM | POA: Diagnosis not present

## 2017-09-21 MED ORDER — TRAMADOL HCL 50 MG PO TABS: 50.0000 mg | ORAL_TABLET | Freq: Three times a day (TID) | ORAL | 0 refills | Status: DC | PRN

## 2017-09-21 MED ORDER — GABAPENTIN 300 MG PO CAPS: 300.0000 mg | ORAL_CAPSULE | Freq: Three times a day (TID) | ORAL | 3 refills | Status: DC

## 2017-09-21 NOTE — Patient Instructions (Addendum)
Apply warm, moist compressed to left breast.   PATIENT INSTRUCTIONCAUSE:  Many women have some lumpiness within their breasts and these areas at times can become tender during certain times in your menstrual cycle.  These areas tend to feel like a firm rubber ball as compared to a cancer which will more commonly feel hard and almost rock-like.  Fibrocystic breast disease does not in and of itself increase your risk for breast cancer but you should be sure to examine yiour breasts at the same time of the month on a monthly basis.  If there are a lot of areas of lumpiness you should tape a piece of paper on the mirror with a diagram of your breasts, noting where the areas of lumpiness are and their relative size.  You can then refer to this diagram on a monthly basis to keep better track of any changes should they occur.  DIET:  ACTIVITY:  You may want to wear a bra that offers additional support, and/or consider a sports bra, especially during those times when your breasts are more tender.   Tramadol 50 mg every 8 hours for moderate to severe pain.   Continue gabapentin 300 mg every 8 hours for moderate to severe pain  Postherpetic Neuralgia Postherpetic neuralgia (PHN) is nerve pain that occurs after a shingles infection. Shingles is a painful rash that appears on one side of the body, usually on your trunk or face. Shingles is caused by the varicella-zoster virus. This is the same virus that causes chickenpox. In people who have had chickenpox, the virus can resurface years later and cause shingles. You may have PHN if you continue to have pain for 3 months after your shingles rash has gone away. PHN appears in the same area where you had the shingles rash. For most people, PHN goes away within 1 year. Getting a vaccination for shingles can prevent PHN. This vaccine is recommended for people older than 50. It may prevent shingles and may also lower your risk of PHN if you do get shingles. What are  the causes? PHN is caused by damage to your nerves from the varicella-zoster virus. This damage makes your nerves overly sensitive. What increases the risk? Aging is the biggest risk factor for developing PHN. Most people who get PHN are older than 79. Other risk factors include:  Having very bad pain before your shingles rash starts.  Having a very bad rash.  Having shingles in the nerve that supplies your face and eye (trigeminal nerve).  What are the signs or symptoms? Pain is the main symptom of PHN. The pain is often very bad and may be described as stabbing, burning, or feeling like an electric shock. The pain may come and go or may be there all the time. Pain may be triggered by light touches on the skin or changes in temperature. You may have itching along with the pain. How is this diagnosed? Your health care provider may diagnose PHN based on your symptoms and your history of shingles. Lab studies and other diagnostic tests are usually not needed. How is this treated? There is no cure for PHN. Treatment for PHN will focus on pain relief. Over-the-counter pain relievers do not usually relieve PHN pain. You may need to work with a pain specialist. Treatment may include:  Antidepressant medicines to help with pain and improve sleep.  Antiseizure medicines to relieve nerve pain.  Strong pain relievers (opioids).  A numbing patch worn on the skin (lidocaine  patch).  Follow these instructions at home: It may take a long time to recover from PHN. Work closely with your health care provider, and have a good support system at home.  Take all medicines as directed by your health care provider.  Wear loose, comfortable clothing.  Cover sensitive areas with a dressing to reduce friction from clothing rubbing on the area.  If cold does not make your pain worse, try applying a cool compress or cooling gel pack to the area.  Talk to your health care provider if you feel depressed or  desperate. Living with long-term pain can be depressing.  Contact a health care provider if:  Your medicine is not helping.  You are struggling to manage your pain at home. This information is not intended to replace advice given to you by your health care provider. Make sure you discuss any questions you have with your health care provider. Document Released: 03/05/2003 Document Revised: 05/20/2016 Document Reviewed: 12/04/2013 Elsevier Interactive Patient Education  [employer].

## 2017-09-24 NOTE — Progress Notes (Signed)
HPI    Chief Complaint  Patient presents with  . Mass    painful mass under left breast. Pain radiates from front to back.     Rhonda Hester, a 41 year old female with a history of sarcoidosis and asthma presents complaining of left breast pain and left upper back pain. She complains of a mass to left breast. She was referred for a mammogram and breast ultrasound 1 month ago. Mammogram and breast ultrasound were negative for malignancy. She continues to have pain. She routinely does self breast exams. She does not have a family history of breast cancer. She is currently taking [name] consistently for menorrhagia. She denies fever, breast discoloration, or nipple discharge.   Rhonda Hester is also complaining of pain to left upper back. She describes pain as burning and intermittent. She says that pain has been occurring intermittently over the past 3-4 weeks.   Past Medical History:  Diagnosis Date  . Allergic asthma   . Breast mass 05/2017   lt breast lump  . Eczema   . GERD (gastroesophageal reflux disease)   . History of anal fissures   . History of Bell's palsy    2006  RIGHT SIDE--  RESOLVED  . History of gastroesophageal reflux (GERD)   . Hyperlipidemia   . Interstitial cystitis   . Sarcoidosis of lung ([place])    DX 2006--  PULMOLOGIST--  DR [facility]  . Seasonal allergic rhinitis   . Shortness of breath    Social History   Social History  . Marital status: Single    Spouse name: N/A  . Number of children: N/A  . Years of education: N/A   Occupational History  . Not on file.   Social History Main Topics  . Smoking status: Former Smoker    Packs/day: 0.30    Years: 6.00    Types: Cigarettes, Cigars    Quit date: 11/2016  . Smokeless tobacco: Never Used     Comment: ONE CIG. DAILY /  1 PPMONTH  . Alcohol use No  . Drug use: No     Comment: HX MARIJUANA USE  . Sexual activity: No   Other Topics Concern  . Not on file   Social History Narrative  . No narrative on  file   Immunization History  Administered Date(s) Administered  . Influenza Whole 09/02/2011, 09/26/2012  . Influenza,inj,Quad PF,6+ Mos 12/27/2012, 09/04/2015, 09/20/2017  . Pneumococcal Polysaccharide-23 05/10/2016  . Tdap 05/10/2016   Review of Systems  Constitutional: Negative.   HENT: Negative.   Respiratory: Negative.   Cardiovascular: Negative.   Gastrointestinal: Negative.   Genitourinary: Negative.        Left breast pain  Musculoskeletal: Positive for back pain.  Skin: Negative.   Neurological: Negative.   Endo/Heme/Allergies: Negative.   Psychiatric/Behavioral: Negative.     Physical Exam  HENT:  Head: Normocephalic.  Right Ear: External ear normal.  Mouth/Throat: Oropharynx is clear and moist.  Eyes: Pupils are equal, round, and reactive to light.  Neck: Normal range of motion. Neck supple.  Cardiovascular: Normal rate, regular rhythm, normal heart sounds and intact distal pulses.   Pulmonary/Chest: Effort normal and breath sounds normal. Left breast exhibits tenderness. Left breast exhibits no inverted nipple, no mass, no nipple discharge and no skin change.    Abdominal: Soft. Bowel sounds are normal.  Neurological: She is alert. Gait normal.  Skin: Skin is warm and dry.    Plan   1. Pain of left breast Discussed recent  mammogram and ultrasound IMPRESSION: No evidence of malignancy in the left breast. The patient is encouraged to perform breast exams while lying supine with her ipsilateral arm raised She continues to have breast tenderness. Patient is on [name] and I explained that a side effect is breast pain and breast tenderness.  You should try and avoid foods, or at least minimize foods, such as chocolate and caffeine which may cause the symptoms of tenderness to become worse.   2. Generalized pain Reviewed [facility] Reporting system prior to prescribing opiate medications. No inconsistencies noted.   - traMADol (ULTRAM) 50 MG tablet; Take  1 tablet (50 mg total) by mouth every 8 (eight) hours as needed.  Dispense: 15 tablet; Refill: 0  3. Post herpetic neuralgia Patient has burning pain to upper back.  Recommend gabapentin 300 mg TID due to history of shingles - gabapentin (NEURONTIN) 300 MG capsule; Take 1 capsule (300 mg total) by mouth 3 (three) times daily.  Dispense: 90 capsule; Refill: 3   RTC: 3 months for chronic conditions   Alpha  MSN, FNP-C Patient [facility] [address] [place], [place] [address] [phone]

## 2017-10-03 ENCOUNTER — Ambulatory Visit (INDEPENDENT_AMBULATORY_CARE_PROVIDER_SITE_OTHER): Payer: Medicare Other | Admitting: Psychiatry

## 2017-10-03 DIAGNOSIS — Z79899 Other long term (current) drug therapy: Secondary | ICD-10-CM | POA: Diagnosis not present

## 2017-10-03 DIAGNOSIS — G4700 Insomnia, unspecified: Secondary | ICD-10-CM | POA: Diagnosis not present

## 2017-10-03 DIAGNOSIS — Z87891 Personal history of nicotine dependence: Secondary | ICD-10-CM

## 2017-10-03 DIAGNOSIS — Z818 Family history of other mental and behavioral disorders: Secondary | ICD-10-CM

## 2017-10-03 DIAGNOSIS — F331 Major depressive disorder, recurrent, moderate: Secondary | ICD-10-CM

## 2017-10-03 MED ORDER — FLUOXETINE HCL 20 MG PO CAPS: 20.0000 mg | ORAL_CAPSULE | Freq: Every day | ORAL | 1 refills | Status: DC

## 2017-10-03 NOTE — Progress Notes (Signed)
[facility] MD/PA/NP OP Progress Note  10/03/2017 1:20 PM Rhonda Hester  MRN:  [id number]  Chief Complaint: med check  HPI: Rhonda Hester presents for follow-up 5 months after her initial intake.   She reports that she went to a trip to [place], and reports that she kept rescheduling due to life conflicts, but she kept taking the Prozac 20 mg. She reports that she ran out about 1 and 1/2 months ago. She has been more depressed, less energy, and less interested in activities that she usually likes. Continues to struggle with significant alexithymia, and has trouble spontaneously describing her feelings. She thinks that she was doing better on the Prozac, and in hearing about the past few months for she stopped, I expressed agreement that it sounds that she was doing better on Prozac.    Reviewing the record, we electronically sent Prozac refill in September, but the patient reports that she was never told I [facility] that it was ready, so she assumed that the refill was not approved. She agrees to restart the Prozac 20 mg daily which was very hopeful for her, and follow-up in 4-6 months or sooner if needed. Educated her that she should continue Prozac, and if she needs a refill in the interim, please call the office and make sure that she picks up a refill from [facility].  She denies any substance abuse or suicidal thinking. She reports that the kids are back at school, so she is still searching for things to do during the day around the house. She tends to like to spend time with the dogs, but admits that she has not been doing this as often as she used to.  Visit Diagnosis:    ICD-10-CM   1. Moderate episode of recurrent major depressive disorder (HCC) F33.1 FLUoxetine (PROZAC) 20 MG capsule    Past Psychiatric History: See intake H&P for full details. Reviewed, with no updates at this time.  Past Medical History:  Past Medical History:  Diagnosis Date  . Allergic asthma   . Breast mass  05/2017   lt breast lump  . Eczema   . GERD (gastroesophageal reflux disease)   . History of anal fissures   . History of Bell's palsy    2006  RIGHT SIDE--  RESOLVED  . History of gastroesophageal reflux (GERD)   . Hyperlipidemia   . Interstitial cystitis   . Sarcoidosis of lung ([place])    DX 2006--  PULMOLOGIST--  DR [facility]  . Seasonal allergic rhinitis   . Shortness of breath     Past Surgical History:  Procedure Laterality Date  . CYSTO WITH HYDRODISTENSION N/A 08/24/2013   Procedure: CYSTOSCOPY/HYDRODISTENSION with instillation of marcaine and pyridium;  Surgeon: Fredricka Bonine, MD;  Location[facility];  Service: Urology;  Laterality: N/A;  . CYSTOSCOPY/ HYDRODISTENTION/ BLADDER BX  06-09-2010  . DILATION AND CURETTAGE OF UTERUS  1997    Family Psychiatric History: See intake H&P for full details. Reviewed, with no updates at this time.   Family History:  Family History  Problem Relation Age of Onset  . Brain cancer Maternal Grandmother   . Hyperlipidemia Other   . Sleep apnea Other   . Depression Maternal Aunt   . Seizures Maternal Uncle     Social History:  Social History   Social History  . Marital status: Single    Spouse name: N/A  . Number of children: N/A  . Years of education: N/A   Social History  Main Topics  . Smoking status: Former Smoker    Packs/day: 0.30    Years: 6.00    Types: Cigarettes, Cigars    Quit date: 11/2016  . Smokeless tobacco: Never Used     Comment: ONE CIG. DAILY /  1 PPMONTH  . Alcohol use No  . Drug use: No     Comment: HX MARIJUANA USE  . Sexual activity: No   Other Topics Concern  . Not on file   Social History Narrative  . No narrative on file    Allergies:  Allergies  Allergen Reactions  . Acyclovir And Related     Patient states it "made me feel like I can't breath"   . Citric Acid Other (See Comments)    AVOIDS DUE TO INTERSTITIAL CYSTITIS  . Dust Mite Extract Itching and Other  (See Comments)     coughing  . Grapeseed Extract [Nutritional Supplements]     Throat itching and burning  . Pineapple   . Pollen Extract Itching and Other (See Comments)     coughing  . Nickel Rash    Metabolic Disorder Labs: Lab Results  Component Value Date   HGBA1C 5.6 08/09/2017   MPG 114 08/09/2017   No results found for: PROLACTIN Lab Results  Component Value Date   CHOL 152 08/09/2017   TRIG 90 08/09/2017   HDL 38 (L) 08/09/2017   CHOLHDL 4.0 08/09/2017   VLDL 18 08/09/2017   LDLCALC 96 08/09/2017   Lab Results  Component Value Date   TSH 1.14 05/10/2016   TSH 1.553 11/01/2012    Therapeutic Level Labs: No results found for: LITHIUM No results found for: VALPROATE No components found for:  CBMZ  Current Medications: Current Outpatient Prescriptions  Medication Sig Dispense Refill  . albuterol (PROAIR HFA) 108 (90 BASE) MCG/ACT inhaler Inhale 2 puffs into the lungs every 6 (six) hours as needed for shortness of breath. 1 Inhaler 5  . FLUoxetine (PROZAC) 20 MG capsule Take 1 capsule (20 mg total) by mouth daily. 90 capsule 1  . gabapentin (NEURONTIN) 300 MG capsule Take 1 capsule (300 mg total) by mouth 3 (three) times daily. 90 capsule 3  . hydrocortisone (ANUSOL-HC) 25 MG suppository Place 1 suppository (25 mg total) rectally 2 (two) times daily. (Patient not taking: Reported on 09/21/2017) 12 suppository 0  . ibuprofen (ADVIL,MOTRIN) 600 MG tablet     . LUTEIN PO Take 1 tablet by mouth daily.    . mometasone (NASONEX) 50 MCG/ACT nasal spray Place 2 sprays into the nose daily.    . mometasone-formoterol (DULERA) 100-5 MCG/ACT AERO Inhale 2 puffs into the lungs 2 (two) times daily. 1 Inhaler 5  . sodium chloride (OCEAN) 0.65 % SOLN nasal spray Place 1 spray into both nostrils as needed for congestion.    . traMADol (ULTRAM) 50 MG tablet Take 1 tablet (50 mg total) by mouth every 8 (eight) hours as needed. 15 tablet 0   Current Facility-Administered Medications   Medication Dose Route Frequency Provider Last Rate Last Dose  . leuprolide ([name]) injection 11.25 mg  11.25 mg Intramuscular Q90 days Dove, Myra C, MD   11.25 mg at 07/29/17 1019  . leuprolide ([name]) injection 3.75 mg  3.75 mg Intramuscular Once Denney, Rachelle A, CNM       Facility-Administered Medications Ordered in Other Visits  Medication Dose Route Frequency Provider Last Rate Last Dose  . bupivacaine (MARCAINE) 0.5 % 15 mL, phenazopyridine (PYRIDIUM) 400 mg bladder mixture  Bladder Instillation Once Festus Aloe, MD         Musculoskeletal: Strength & Muscle Tone: within normal limits Gait & Station: normal Patient leans: N/A  Psychiatric Specialty Exam: Review of Systems  Constitutional: Negative.   HENT: Negative.   Respiratory: Positive for cough and shortness of breath.   Cardiovascular: Negative.   Gastrointestinal: Negative.   Musculoskeletal: Negative.   Neurological: Negative.   Psychiatric/Behavioral: Positive for depression. The patient has insomnia.     There were no vitals taken for this visit.There is no height or weight on file to calculate BMI.  General Appearance: Casual and Fairly Groomed  Eye Contact:  Fair  Speech:  Clear and Coherent  Volume:  Decreased  Mood:  Depressed and Dysphoric  Affect:  Depressed and Flat  Thought Process:  Goal Directed  Orientation:  Full (Time, Place, and Person)  Thought Content: Logical   Suicidal Thoughts:  No  Homicidal Thoughts:  No  Memory:  Immediate;   Fair  Judgement:  Fair  Insight:  Fair  Psychomotor Activity:  Normal  Concentration:  Attention Span: Fair  Recall:  Good  Fund of Knowledge: Good  Language: Good  Akathisia:  Negative  Handed:  Right  AIMS (if indicated): not done  Assets:  Communication Skills Desire for Improvement Financial Resources/Insurance Housing Social Support Transportation  ADL's:  Intact  Cognition: WNL  Sleep:  Fair   Screenings: GAD-7     Office Visit  from 04/01/2017 in [facility]  Total GAD-7 Score  6    PHQ2-9     Office Visit from 09/21/2017 in [facility] Office Visit from 08/09/2017 in [facility] Office Visit from 06/08/2017 in [facility] Nutrition [facility]2017 [facility] Office Visit from 04/01/2017 in [facility]  PHQ-2 Total Score  0  0  2  2  3   PHQ-9 Total Score  -  -  -  -  8       Assessment and Plan:  Rhonda Hester is a 41 year old female with multiple medical problems, who presents today for psychiatric follow-up for major depressive disorder. It appears that she had improvement and possibly remission of her symptoms on Prozac 20 mg, but for unclear reasons and miscommunication with the pharmacy, she discontinued Prozac 20 mg about one month ago. She has had a return of her depressive symptoms, and we have agreed to proceed as below and follow up in 4 months or sooner. His not present any acute safety issues and does not engage in any substance abuse.  1. Moderate episode of recurrent major depressive disorder (HCC)    Restart Prozac 20 mg daily; okay to increase to 40 mg daily if her depressive symptoms do not respond within 1 month Follow-up in 4 months  I spent 20 minutes with the patient in direct care and counseling. We discussed improvement of her symptoms on Prozac, and some of the positive steps she had noticed prior to discontinuing Prozac. Reviewed treatment planning and medication adherence.      Aundra Dubin, MD 10/03/2017, 1:20 PM

## 2017-10-06 ENCOUNTER — Ambulatory Visit: Payer: Self-pay | Admitting: Registered"

## 2017-10-11 ENCOUNTER — Telehealth: Payer: Self-pay

## 2017-10-12 ENCOUNTER — Other Ambulatory Visit: Payer: Self-pay | Admitting: Family Medicine

## 2017-10-12 DIAGNOSIS — R52 Pain, unspecified: Secondary | ICD-10-CM

## 2017-10-12 DIAGNOSIS — N644 Mastodynia: Secondary | ICD-10-CM

## 2017-10-12 MED ORDER — TRAMADOL HCL 50 MG PO TABS: 50.0000 mg | ORAL_TABLET | Freq: Three times a day (TID) | ORAL | 0 refills | Status: DC | PRN

## 2017-10-12 NOTE — Telephone Encounter (Signed)
[place] PLEASE ADVISE IF PATIENT CAN HAVE A REFILL ON TRAMADOL. THANKS!

## 2017-10-12 NOTE — Progress Notes (Signed)
Meds ordered this encounter  Medications  . traMADol (ULTRAM) 50 MG tablet    Sig: Take 1 tablet (50 mg total) by mouth every 8 (eight) hours as needed.    Dispense:  30 tablet    Refill:  0    Order Specific Question:   Supervising Provider    Answer:   Tresa Garter [[id number]]    [name]  MSN, FNP-C Patient [facility] [address] [place], [place] [address] [phone]

## 2017-10-20 ENCOUNTER — Encounter: Payer: Medicare Other | Attending: Acute Care | Admitting: Registered"

## 2017-10-20 NOTE — Progress Notes (Signed)
Pt states she doesn't know why she was contacted by our office with a referral because she has already been here and met with another RD, last visit was in July.   RD and front office did not find an additional referral and RD believes she was contacted by mistake.   Pt was not interested in getting additional nutritional counseling and RD did not provide further service today or charge for this check-in.

## 2017-11-09 ENCOUNTER — Ambulatory Visit (INDEPENDENT_AMBULATORY_CARE_PROVIDER_SITE_OTHER): Payer: Medicare Other

## 2017-11-09 DIAGNOSIS — Z3202 Encounter for pregnancy test, result negative: Secondary | ICD-10-CM | POA: Diagnosis not present

## 2017-11-09 DIAGNOSIS — D251 Intramural leiomyoma of uterus: Secondary | ICD-10-CM

## 2017-11-09 DIAGNOSIS — Z3042 Encounter for surveillance of injectable contraceptive: Secondary | ICD-10-CM

## 2017-11-09 DIAGNOSIS — N809 Endometriosis, unspecified: Secondary | ICD-10-CM

## 2017-11-09 LAB — POCT URINE PREGNANCY: PREG TEST UR: NEGATIVE

## 2017-11-09 MED ORDER — LEUPROLIDE ACETATE (3 MONTH) 11.25 MG IM KIT
11.2500 mg | PACK | Freq: Once | INTRAMUSCULAR | Status: AC
  Administered 2017-11-09: 11.25 mg via INTRAMUSCULAR

## 2017-11-09 NOTE — Progress Notes (Addendum)
Nurse visit for UPT to restart Depo [name]. Pt is late receiving injection; Depo restart protocol initiated. Pt is upset. She said being late was not her fault because at last visit, checkout told her we would call her to make an appt but no one called. Pt adamantly requesing injection today. She said she is "saved" and has not had IC in years. Consulted with Dr. Jodi Mourning. We will accept pt's honesty; she may receive [name] today. UPT neg. Pt informed since she receives inj every 3 months, she should keep up with injections as well and not rely on phone calls. Pt agrees and voices understanding. Pt said she does not know if she wants to continue [name] because it's causing breast pain. Pt has an appt at [facility] for further evaluation. Pt to follow up with [place] if needed.   Administrations This Visit    leuprolide ([name]) injection 11.25 mg    Admin Date 11/09/2017 Action Given Dose 11.25 mg Route Intramuscular Administered By Hollice Gong, CMA

## 2017-11-18 ENCOUNTER — Ambulatory Visit (HOSPITAL_COMMUNITY)
Admission: EM | Admit: 2017-11-18 | Discharge: 2017-11-18 | Disposition: A | Discharge status: Home / Self Care | Payer: Medicare Other | Attending: Internal Medicine | Admitting: Internal Medicine

## 2017-11-18 ENCOUNTER — Encounter (HOSPITAL_COMMUNITY): Payer: Self-pay | Admitting: Emergency Medicine

## 2017-11-18 ENCOUNTER — Ambulatory Visit (INDEPENDENT_AMBULATORY_CARE_PROVIDER_SITE_OTHER): Payer: Medicare Other

## 2017-11-18 DIAGNOSIS — M25511 Pain in right shoulder: Secondary | ICD-10-CM

## 2017-11-18 MED ORDER — NAPROXEN 375 MG PO TABS: 375.0000 mg | ORAL_TABLET | Freq: Two times a day (BID) | ORAL | 0 refills | Status: DC

## 2017-11-18 NOTE — ED Provider Notes (Signed)
[place]    CSN: [id number] Arrival date & time: 11/18/17  [place]     History   Chief Complaint Chief Complaint  Patient presents with  . Shoulder Pain    HPI Rhonda Hester is a 41 y.o. female with history of sarcoidosis coming in for right shoulder pain. Her shoulder pain began 2 weeks ago and has failed to improve. She cannot recall any specific injury, but feels like one night while she was sleeping she lifted herself up and "it did not feel right". Movement worsens pain especially overhead, behind the back movements. She has had difficulty putting clothes on and requires assistance. Shoulder limiting her ability to interact with her god children. She has tried Ibuprofen and Icy-Hot patches without relief. Denies history of diabetes. No numbness or tingling. No loss of sensation.   HPI  Past Medical History:  Diagnosis Date  . Allergic asthma   . Breast mass 05/2017   lt breast lump  . Eczema   . GERD (gastroesophageal reflux disease)   . History of anal fissures   . History of Bell's palsy    2006  RIGHT SIDE--  RESOLVED  . History of gastroesophageal reflux (GERD)   . Hyperlipidemia   . Interstitial cystitis   . Sarcoidosis of lung ([place])    DX 2006--  PULMOLOGIST--  DR AYTK  . Seasonal allergic rhinitis   . Shortness of breath     Patient Active Problem List   Diagnosis Date Noted  . Chronic bilateral low back pain without sciatica 08/01/2017  . Chronic pain of right knee 08/01/2017  . Depression due to physical illness 04/02/2017  . Pelvic pain 03/28/2017  . Allergic reaction to food 03/19/2017  . Obesity (BMI 30.0-34.9) 03/19/2017  . Generalized pain 03/19/2017  . Metabolic syndrome 16/12/930  . Medication management 03/19/2017  . Tobacco abuse 01/24/2017  . Uterine fibroid 01/17/2017  . Interstitial cystitis 01/17/2017  . Menorrhagia 12/06/2016  . Acne vulgaris 05/10/2016  . BMI 31.0-31.9,adult 05/10/2016  . Weight gain 05/10/2016  .  Right hip pain 10/27/2014  . Sarcoidosis of lung ([place])   . Cervical adenopathy 03/12/2014  . History of smoking 08/07/2013  . Allergic asthma 07/22/2011    Past Surgical History:  Procedure Laterality Date  . CYSTO WITH HYDRODISTENSION N/A 08/24/2013   Procedure: CYSTOSCOPY/HYDRODISTENSION with instillation of marcaine and pyridium;  Surgeon: Fredricka Bonine, MD;  Location: [facility];  Service: Urology;  Laterality: N/A;  . CYSTOSCOPY/ HYDRODISTENTION/ BLADDER BX  06-09-2010  . DILATION AND CURETTAGE OF UTERUS  1997    OB History    Gravida Para Term Preterm AB Living   1 0 0 0 1 0   SAB TAB Ectopic Multiple Live Births   1 0 0 0         Home Medications    Prior to Admission medications   Medication Sig Start Date End Date Taking? Authorizing Provider  albuterol (PROAIR HFA) 108 (90 BASE) MCG/ACT inhaler Inhale 2 puffs into the lungs every 6 (six) hours as needed for shortness of breath. 05/15/15   Chesley Mires, MD  FLUoxetine (PROZAC) 20 MG capsule Take 1 capsule (20 mg total) by mouth daily. 10/03/17   Eksir, Richard Miu, MD  gabapentin (NEURONTIN) 300 MG capsule Take 1 capsule (300 mg total) by mouth 3 (three) times daily. 09/21/17   Dorena Dew, FNP  hydrocortisone (ANUSOL-HC) 25 MG suppository Place 1 suppository (25 mg total) rectally 2 (two) times  daily. 08/09/17   Scot Jun, FNP  ibuprofen (ADVIL,MOTRIN) 600 MG tablet  05/19/17   [provider]  LUTEIN PO Take 1 tablet by mouth daily.    [provider]  mometasone (NASONEX) 50 MCG/ACT nasal spray Place 2 sprays into the nose daily.    [provider]  mometasone-formoterol (DULERA) 100-5 MCG/ACT AERO Inhale 2 puffs into the lungs 2 (two) times daily. 09/20/17   Chesley Mires, MD  naproxen (NAPROSYN) 375 MG tablet Take 1 tablet (375 mg total) by mouth 2 (two) times daily for 10 days. 11/18/17 11/28/17  Luevenia Mcavoy C, PA-C  sodium chloride (OCEAN) 0.65 %  SOLN nasal spray Place 1 spray into both nostrils as needed for congestion.    [provider]  traMADol (ULTRAM) 50 MG tablet Take 1 tablet (50 mg total) by mouth every 8 (eight) hours as needed. 10/12/17   Dorena Dew, FNP    Family History Family History  Problem Relation Age of Onset  . Brain cancer Maternal Grandmother   . Hyperlipidemia Other   . Sleep apnea Other   . Depression Maternal Aunt   . Seizures Maternal Uncle     Social History Social History   Tobacco Use  . Smoking status: Former Smoker    Packs/day: 0.30    Years: 6.00    Pack years: 1.80    Types: Cigarettes, Cigars    Last attempt to quit: 11/2016    Years since quitting: 0.9  . Smokeless tobacco: Never Used  . Tobacco comment: ONE CIG. DAILY /  1 PPMONTH  Substance Use Topics  . Alcohol use: No    Alcohol/week: 0.0 oz  . Drug use: No    Comment: HX MARIJUANA USE     Allergies   Acyclovir and related; Citric acid; Dust mite extract; Grapeseed extract [nutritional supplements]; Pineapple; Pollen extract; and Nickel   Review of Systems Review of Systems  Constitutional: Negative for fever.  Musculoskeletal: Negative for neck stiffness.       Right Shoulder pain  Skin: Negative for rash.  Neurological: Positive for weakness. Negative for numbness.     Physical Exam Triage Vital Signs ED Triage Vitals  Enc Vitals Group     BP 11/18/17 1618 129/74     Pulse Rate 11/18/17 1618 80     Resp 11/18/17 1618 18     Temp 11/18/17 1618 99 F (37.2 C)     Temp Source 11/18/17 1618 Oral     SpO2 11/18/17 1618 100 %     Weight --      Height --      Head Circumference --      Peak Flow --      Pain Score 11/18/17 1619 10     Pain Loc --      Pain Edu? --      Excl. in [place]? --    No data found.  Updated Vital Signs BP 129/74 (BP Location: Left Arm)   Pulse 80   Temp 99 F (37.2 C) (Oral)   Resp 18   SpO2 100%   Physical Exam  Constitutional: She is oriented to person,  place, and time. She appears well-developed and well-nourished.  Female holding shoulder in moderate distress.  Musculoskeletal: She exhibits tenderness. She exhibits no edema.  Active ROM limited due to pain. Tenderness to palpation diffusely across anterior shoulder and down lateral aspect. Special tests deferred as patient was resistant to any movement.  Right Radial pulse 2+.  Neurological: She is alert and oriented to person, place, and time. No sensory deficit.      UC Treatments / Results  Labs (all labs ordered are listed, but only abnormal results are displayed) Labs Reviewed - No data to display  EKG  EKG Interpretation None      Advised that I did not think imaging would be necessary. Patient urged and felt like she needed imaging as her pain was 10/10 and it is affecting her life.   Radiology No results found.  Procedures Procedures (including critical care time)  Medications Ordered in UC Medications - No data to display   Initial Impression / Assessment and Plan / UC Course  I have reviewed the triage vital signs and the nursing notes.  Pertinent labs & imaging results that were available during my care of the patient were reviewed by me and considered in my medical decision making (see chart for details).    No acute pathology revealed on Xray. Advised patient to continue pain control. May try naprosyn 375 mg, 1 tablet twice a day. Try Ice and heating pad. Sling provided for comfort, advised not to wear all the time or shoulder may get stiff  Advised to follow-up with PCP or orthopedics for further evaluation of the cause of her shoulder pain.  Final Clinical Impressions(s) / UC Diagnoses   Final diagnoses:  Acute pain of right shoulder    ED Discharge Orders        Ordered    naproxen (NAPROSYN) 375 MG tablet  2 times daily     11/18/17 1810       Controlled Substance Prescriptions [place] Controlled Substance Registry consulted? Not Applicable    Janith Lima, [place] 11/18/17 1827

## 2017-11-18 NOTE — ED Triage Notes (Signed)
Pt sts right shoulder pain x 3 weeks; pt denies obvious injury

## 2017-11-18 NOTE — Discharge Instructions (Signed)
No cause of pain was seen on your Xray today. We can continue to try pain control for now. Ibuprofen 800 mg every 8 hours. Ice and heat alternate every 20-30 minutes. May use sling for comfort, but do not use for 24 hrs a day as shoulder may get stiff.    Try exercises as tolerated.   Follow up with your PCP or an Orthopedic doctor for further evaluation of your shoulder pain.

## 2017-11-25 ENCOUNTER — Encounter: Payer: Self-pay | Admitting: Family Medicine

## 2017-11-25 ENCOUNTER — Ambulatory Visit (INDEPENDENT_AMBULATORY_CARE_PROVIDER_SITE_OTHER): Payer: Medicare Other | Admitting: Family Medicine

## 2017-11-25 VITALS — BP 131/83 | HR 85 | Temp 97.8°F | Resp 16 | Ht 66.0 in | Wt 199.0 lb

## 2017-11-25 DIAGNOSIS — R52 Pain, unspecified: Secondary | ICD-10-CM

## 2017-11-25 DIAGNOSIS — M25511 Pain in right shoulder: Secondary | ICD-10-CM | POA: Diagnosis not present

## 2017-11-25 MED ORDER — NAPROXEN 500 MG PO TABS: 500.0000 mg | ORAL_TABLET | Freq: Two times a day (BID) | ORAL | 0 refills | Status: AC

## 2017-11-25 MED ORDER — TRAMADOL HCL 50 MG PO TABS: 50.0000 mg | ORAL_TABLET | Freq: Four times a day (QID) | ORAL | 0 refills | Status: DC | PRN

## 2017-11-25 NOTE — Progress Notes (Signed)
Subjective:    Patient ID: Rhonda Hester, female    DOB: [birth year], 41 y.o.   MRN: [id number]  HPI  Rhonda Hester, a 41 year old female with a history of sarcoidosis of lung presents complaining of right shoulder pain for greater than 2 weeks.  Patient states that pain has failed to improve.  Patient was also evaluated by urgent care on 11/18/2017 and was treated with naproxen 375 mg twice daily without relief.  Patient denies a specific injury to right shoulder but states that she awaken with right shoulder pain.  She states that pain started immediately after sitting up in bed, she states "it just did not feel right".  Location is to right upper arm and right shoulder.  Pain is aggravated by lifting objects, overhead lifting and behind the back movements.  She states that she has difficulty with housework and applying clothing.  Patient has required assistance from her family.  Patient endorses fatigue.  She denies numbness tingling, loss of sensation, fever, or recent falls.  Past Medical History:  Diagnosis Date  . Allergic asthma   . Breast mass 05/2017   lt breast lump  . Eczema   . GERD (gastroesophageal reflux disease)   . History of anal fissures   . History of Bell's palsy    2006  RIGHT SIDE--  RESOLVED  . History of gastroesophageal reflux (GERD)   . Hyperlipidemia   . Interstitial cystitis   . Sarcoidosis of lung ([place])    DX 2006--  PULMOLOGIST--  DR NWGN  . Seasonal allergic rhinitis   . Shortness of breath    Immunization History  Administered Date(s) Administered  . Influenza Whole 09/02/2011, 09/26/2012  . Influenza,inj,Quad PF,6+ Mos 12/27/2012, 09/04/2015, 09/20/2017  . Pneumococcal Polysaccharide-23 05/10/2016  . Tdap 05/10/2016   Social History   Socioeconomic History  . Marital status: Single    Spouse name: Not on file  . Number of children: Not on file  . Years of education: Not on file  . Highest education level: Not on file  Social Needs   . Financial resource strain: Not on file  . Food insecurity - worry: Not on file  . Food insecurity - inability: Not on file  . Transportation needs - medical: Not on file  . Transportation needs - non-medical: Not on file  Occupational History  . Not on file  Tobacco Use  . Smoking status: Former Smoker    Packs/day: 0.30    Years: 6.00    Pack years: 1.80    Types: Cigarettes, Cigars    Last attempt to quit: 11/2016    Years since quitting: 1.0  . Smokeless tobacco: Never Used  . Tobacco comment: ONE CIG. DAILY /  1 PPMONTH  Substance and Sexual Activity  . Alcohol use: No    Alcohol/week: 0.0 oz  . Drug use: No    Comment: HX MARIJUANA USE  . Sexual activity: No    Birth control/protection: None    Comment: abstinence   Other Topics Concern  . Not on file  Social History Narrative  . Not on file    Review of Systems  Constitutional: Positive for fatigue.  HENT: Negative.   Respiratory: Negative.   Cardiovascular: Negative.   Gastrointestinal: Negative.   Genitourinary: Negative.   Musculoskeletal: Positive for arthralgias (Right shoulder pain). Negative for joint swelling.  Skin: Negative.   Allergic/Immunologic: Negative.   Neurological: Negative.   Hematological: Negative.   Psychiatric/Behavioral: Negative.  Objective:   Physical Exam  Constitutional: She is oriented to person, place, and time.  HENT:  Head: Normocephalic and atraumatic.  Right Ear: External ear normal.  Left Ear: External ear normal.  Nose: Nose normal.  Mouth/Throat: Oropharynx is clear and moist.  Eyes: Pupils are equal, round, and reactive to light.  Neck: Normal range of motion. Neck supple.  Cardiovascular: Normal rate, regular rhythm, normal heart sounds and intact distal pulses.  Pulmonary/Chest: Effort normal and breath sounds normal.  Abdominal: Soft. Bowel sounds are normal.  Musculoskeletal:       Right shoulder: She exhibits decreased range of motion,  tenderness, swelling, pain and decreased strength (3-/5).  Neurological: She is alert and oriented to person, place, and time.  Skin: Skin is warm and dry.  Psychiatric: She has a normal mood and affect. Her behavior is normal. Judgment and thought content normal.      BP 131/83 (BP Location: Right Arm, Patient Position: Sitting, Cuff Size: Normal)   Pulse 85   Temp 97.8 F (36.6 C) (Oral)   Resp 16   Ht 5\' 6"  (1.676 m)   Wt 199 lb (90.3 kg)   SpO2 98%   BMI 32.12 kg/m  Assessment & Plan:   Acute pain of right shoulder We will follow-up by phone with any abnormal lab results.  Will increase naproxen to 500 mg twice daily for 10 days. Patient is unable to lift above head or apply clothing.  She also states that she has been unable to lift godchild.  Patient warrants a CT of the right shoulder for further evaluation.   - naproxen (NAPROSYN) 500 MG tablet; Take 1 tablet (500 mg total) by mouth 2 (two) times daily for 10 days.  Dispense: 30 tablet; Refill: 0 - CT SHOULDER RIGHT WO CONTRAST; Future - C-reactive protein - Sedimentation Rate   Generalized pain - traMADol (ULTRAM) 50 MG tablet; Take 1 tablet (50 mg total) by mouth every 6 (six) hours as needed.  Dispense: 40 tablet; Refill: 0   RTC: Will follow up by phone after reviewing CT of right shoulder   [name]  MSN, FNP-C Patient [facility] [address] [place], [place] [address] [phone]

## 2017-11-25 NOTE — Patient Instructions (Addendum)
Will start a trial of Naproxen 500 mg twice daily with food.   Also, Tramadol 50 mg every 6 hours as needed for moderate to severe pain.   Continue right shoulder sling. Also, will follow up by phone after reviewing CT results.   CT of right shoulder on 12/01/2017.  Shoulder Pain Many things can cause shoulder pain, including:  An injury.  Moving the arm in the same way again and again (overuse).  Joint pain (arthritis).  Follow these instructions at home: Take these actions to help with your pain:  Squeeze a soft ball or a foam pad as much as you can. This helps to prevent swelling. It also makes the arm stronger.  Take over-the-counter and prescription medicines only as told by your doctor.  If told, put ice on the area: ? Put ice in a plastic bag. ? Place a towel between your skin and the bag. ? Leave the ice on for 20 minutes, 2-3 times per day. Stop putting on ice if it does not help with the pain.  If you were given a shoulder sling or immobilizer: ? Wear it as told. ? Remove it to shower or bathe. ? Move your arm as little as possible. ? Keep your hand moving. This helps prevent swelling.  Contact a doctor if:  Your pain gets worse.  Medicine does not help your pain.  You have new pain in your arm, hand, or fingers. Get help right away if:  Your arm, hand, or fingers: ? Tingle. ? Are numb. ? Are swollen. ? Are painful. ? Turn white or blue. This information is not intended to replace advice given to you by your health care provider. Make sure you discuss any questions you have with your health care provider. Document Released: 05/31/2008 Document Revised: 08/08/2016 Document Reviewed: 04/07/2015 Elsevier Interactive Patient Education  [employer].

## 2017-11-26 DIAGNOSIS — M25511 Pain in right shoulder: Secondary | ICD-10-CM | POA: Insufficient documentation

## 2017-11-26 LAB — C-REACTIVE PROTEIN: CRP: 61.5 mg/L — AB (ref <8.0)

## 2017-11-26 LAB — SEDIMENTATION RATE: SED RATE: 118 mm/h — AB (ref 0–20)

## 2017-12-01 ENCOUNTER — Ambulatory Visit (HOSPITAL_COMMUNITY): Payer: Medicare Other

## 2017-12-01 ENCOUNTER — Ambulatory Visit (HOSPITAL_COMMUNITY)
Admission: RE | Admit: 2017-12-01 | Discharge: 2017-12-01 | Disposition: A | Discharge status: Home / Self Care | Payer: Medicare Other | Source: Ambulatory Visit | Attending: Family Medicine | Admitting: Family Medicine

## 2017-12-01 DIAGNOSIS — M25511 Pain in right shoulder: Secondary | ICD-10-CM | POA: Diagnosis present

## 2017-12-05 ENCOUNTER — Ambulatory Visit: Payer: Medicare Other | Admitting: Family Medicine

## 2017-12-08 ENCOUNTER — Ambulatory Visit: Payer: Medicare Other | Admitting: Family Medicine

## 2017-12-15 ENCOUNTER — Ambulatory Visit: Payer: Medicare Other | Admitting: Family Medicine

## 2017-12-16 ENCOUNTER — Encounter: Payer: Self-pay | Admitting: Family Medicine

## 2017-12-16 ENCOUNTER — Ambulatory Visit (INDEPENDENT_AMBULATORY_CARE_PROVIDER_SITE_OTHER): Payer: Medicare Other | Admitting: Family Medicine

## 2017-12-16 VITALS — BP 126/72 | HR 94 | Temp 98.1°F | Resp 16 | Ht 66.0 in | Wt 198.0 lb

## 2017-12-16 DIAGNOSIS — R220 Localized swelling, mass and lump, head: Secondary | ICD-10-CM

## 2017-12-16 DIAGNOSIS — M26609 Unspecified temporomandibular joint disorder, unspecified side: Secondary | ICD-10-CM | POA: Diagnosis not present

## 2017-12-16 MED ORDER — CYCLOBENZAPRINE HCL 5 MG PO TABS: 5.0000 mg | ORAL_TABLET | Freq: Every day | ORAL | 0 refills | Status: DC

## 2017-12-16 MED ORDER — PREDNISONE 10 MG PO TABS: ORAL_TABLET | ORAL | 0 refills | Status: DC

## 2017-12-16 NOTE — Patient Instructions (Addendum)
I suspect that you have temporomandibular joint syndrome or TMJ.  I recommend that you follow-up with your dentist.  Will use conservative treatment at this point continue home nonsteroidal anti-inflammatories as directed.  Be aware of any teeth clenching or grinding habits, relax jaw by disengaging her teeth.  Try to avoid Y at uncontrolled opening such as yawning.  Will start a trial of a muscle relaxer cyclobenzaprine 5 mg at be  We will start a trial of prednisone for left facial swelling.  Course will last for 5 days.  Return on Friday, December 24, 2017  50 mg on day 1 40 mg on day 2 30 mg on day 3 20 mg on day 4 10 mg on day 5

## 2017-12-16 NOTE — Progress Notes (Signed)
Subjective:    Patient ID: Rhonda Hester, female    DOB: 06/01/1976, 41 y.o.   MRN: [id number]  HPI Rhonda Hester, a 41 year old female with a history sarcoidosis of lung, chronic pain, and GERD presents complaining of left facial swelling and right jaw clicking. Rhonda Hester says that pain has been worsening over the past several days. Left facial swelling has been occurring intermittently over the past 2-3 months. Patient denies a history of temporal mandibular joint syndrome and has regular dental exams twice yearly. She has not attempted any over the counter interventions to alleviate current symptoms. Pain intensity is 5/10 characterized as aching and intermittent. Patient denies fever, shortness of breath, headache, dizziness, dysuria, abdominal pain, nausea, vomiting,or diarrhea.   Past Medical History:  Diagnosis Date  . Allergic asthma   . Breast mass 05/2017   lt breast lump  . Eczema   . GERD (gastroesophageal reflux disease)   . History of anal fissures   . History of Bell's palsy    2006  RIGHT SIDE--  RESOLVED  . History of gastroesophageal reflux (GERD)   . Hyperlipidemia   . Interstitial cystitis   . Sarcoidosis of lung ([place])    DX 2006--  PULMOLOGIST--  DR [facility]  . Seasonal allergic rhinitis   . Shortness of breath    Social History   Socioeconomic History  . Marital status: Single    Spouse name: Not on file  . Number of children: Not on file  . Years of education: Not on file  . Highest education level: Not on file  Social Needs  . Financial resource strain: Not on file  . Food insecurity - worry: Not on file  . Food insecurity - inability: Not on file  . Transportation needs - medical: Not on file  . Transportation needs - non-medical: Not on file  Occupational History  . Not on file  Tobacco Use  . Smoking status: Former Smoker    Packs/day: 0.30    Years: 6.00    Pack years: 1.80    Types: Cigarettes, Cigars    Last attempt to quit: 11/2016   Years since quitting: 1.0  . Smokeless tobacco: Never Used  . Tobacco comment: ONE CIG. DAILY /  1 PPMONTH  Substance and Sexual Activity  . Alcohol use: No    Alcohol/week: 0.0 oz  . Drug use: No    Comment: HX MARIJUANA USE  . Sexual activity: No    Birth control/protection: None    Comment: abstinence   Other Topics Concern  . Not on file  Social History Narrative  . Not on file   Review of Systems  HENT:       Left facial swelling  Cardiovascular: Negative.   Gastrointestinal: Negative.   Musculoskeletal: Positive for arthralgias and joint swelling.  Skin: Negative.   Hematological: Negative.   Psychiatric/Behavioral: Negative.        Objective:   Physical Exam  HENT:  Head:    Mouth/Throat: Abnormal dentition. No uvula swelling.  Facial locking and catching of right jaw.  Slight clicking of right jaw   Left facial nodule, tender to palpation over parotid node.          BP 126/72 (BP Location: Right Arm, Patient Position: Sitting, Cuff Size: Normal)   Pulse 94   Temp 98.1 F (36.7 C) (Oral)   Resp 16   Ht 5\' 6"  (1.676 m)   Wt 198 lb (89.8 kg)   SpO2 94%  BMI 31.96 kg/m   Assessment & Plan:  TMJ (temporomandibular joint disorder) Suspect temporomandibular joint syndrome or TMJ. Will use conservative treatment at this point continue home nonsteroidal anti-inflammatories as directed.  Be aware of any teeth clenching or grinding habits, relax jaw by disengaging her teeth.  Try to avoid any uncontrolled opening such as yawning.  Will start a trial of cyclobenzaprine for symptoms of  TMJ Recommend that patient scheduled follow up with her dentist  - cyclobenzaprine (FLEXERIL) 5 MG tablet; Take 1 tablet (5 mg total) by mouth at bedtime.  Dispense: 30 tablet; Refill: 0  Swelling of left side of face - predniSONE (DELTASONE) 10 MG tablet; 50 mg on day 1; 40 mg on day 2; 30 mg on day 3; 20 mg on day 4; 10 mg on day 5  Dispense: 15 tablet; Refill:  0    RTC: Will follow up in 1 week  Rhonda Pounds  MSN, FNP-C Patient [facility] [address] [place], [place] [address] [phone]

## 2017-12-23 ENCOUNTER — Encounter: Payer: Self-pay | Admitting: Family Medicine

## 2017-12-23 ENCOUNTER — Ambulatory Visit (INDEPENDENT_AMBULATORY_CARE_PROVIDER_SITE_OTHER): Payer: Medicare Other | Admitting: Family Medicine

## 2017-12-23 VITALS — BP 124/79 | HR 97 | Temp 98.3°F | Resp 16 | Ht 66.0 in | Wt 202.0 lb

## 2017-12-23 DIAGNOSIS — M26609 Unspecified temporomandibular joint disorder, unspecified side: Secondary | ICD-10-CM | POA: Insufficient documentation

## 2017-12-23 DIAGNOSIS — M2669 Other specified disorders of temporomandibular joint: Secondary | ICD-10-CM

## 2017-12-23 NOTE — Progress Notes (Signed)
Subjective:    Patient ID: Rhonda Hester, female    DOB: 09-21-1976, 41 y.o.   MRN: [id number]  HPI Rhonda Hester, a 41 year old female with a history sarcoidosis of lung, chronic pain, and GERD presents for a follow up of left facial swelling and right jaw clicking. Rhonda Hester says that pain has improved since completing steroid taper and muscle relaxer HS. Pt has also scheduled a dental appointment for evaluation of probable temporal mandibular joint syndrome. She currently denies pain, fever, shortness of breath, headache, dizziness, dysuria, abdominal pain, nausea, vomiting,or diarrhea.   Past Medical History:  Diagnosis Date  . Allergic asthma   . Breast mass 05/2017   lt breast lump  . Eczema   . GERD (gastroesophageal reflux disease)   . History of anal fissures   . History of Bell's palsy    2006  RIGHT SIDE--  RESOLVED  . History of gastroesophageal reflux (GERD)   . Hyperlipidemia   . Interstitial cystitis   . Sarcoidosis of lung ([place])    DX 2006--  PULMOLOGIST--  DR RKYH  . Seasonal allergic rhinitis   . Shortness of breath    Social History   Socioeconomic History  . Marital status: Single    Spouse name: Not on file  . Number of children: Not on file  . Years of education: Not on file  . Highest education level: Not on file  Social Needs  . Financial resource strain: Not on file  . Food insecurity - worry: Not on file  . Food insecurity - inability: Not on file  . Transportation needs - medical: Not on file  . Transportation needs - non-medical: Not on file  Occupational History  . Not on file  Tobacco Use  . Smoking status: Former Smoker    Packs/day: 0.30    Years: 6.00    Pack years: 1.80    Types: Cigarettes, Cigars    Last attempt to quit: 11/2016    Years since quitting: 1.0  . Smokeless tobacco: Never Used  . Tobacco comment: ONE CIG. DAILY /  1 PPMONTH  Substance and Sexual Activity  . Alcohol use: No    Alcohol/week: 0.0 oz  . Drug  use: No    Comment: HX MARIJUANA USE  . Sexual activity: No    Birth control/protection: None    Comment: abstinence   Other Topics Concern  . Not on file  Social History Narrative  . Not on file   Review of Systems  HENT: Positive for dental problem.   Cardiovascular: Negative.   Gastrointestinal: Negative.   Musculoskeletal: Positive for arthralgias.  Skin: Negative.   Hematological: Negative.   Psychiatric/Behavioral: Negative.       Objective:   Physical Exam  Constitutional: She is oriented to person, place, and time. She appears well-developed and well-nourished.  HENT:  Head: Normocephalic and atraumatic.  Right Ear: External ear normal.  Left Ear: External ear normal.  Nose: Nose normal.  Mouth/Throat: Oropharynx is clear and moist. Abnormal dentition. No uvula swelling.  Facial locking and catching of right jaw.  Slight clicking of right jaw   Left facial nodule, tender to palpation over parotid node.   Eyes: Pupils are equal, round, and reactive to light.  Cardiovascular: Normal rate, regular rhythm, normal heart sounds and intact distal pulses.  Pulmonary/Chest: Effort normal and breath sounds normal.  Abdominal: Soft. Bowel sounds are normal.  Musculoskeletal: Normal range of motion.  Neurological: She is alert and oriented  to person, place, and time. She has normal reflexes.  Skin: Skin is warm and dry.  Psychiatric: She has a normal mood and affect. Her behavior is normal. Judgment and thought content normal.         BP 124/79 (BP Location: Right Arm, Patient Position: Sitting, Cuff Size: Normal)   Pulse 97   Temp 98.3 F (36.8 C) (Oral)   Resp 16   Ht 5\' 6"  (1.676 m)   Wt 202 lb (91.6 kg)   SpO2 99%   BMI 32.60 kg/m   Assessment & Plan:  Crepitus of right TMJ on opening of jaw Suspect temporomandibular joint syndrome or TMJ. Will continue to use conservative treatment. Continue home nonsteroidal anti-inflammatories as directed.  Be aware of  any teeth clenching or grinding habits, relax jaw by disengaging her teeth.  Try to avoid any uncontrolled opening such as yawning.   Continue cyclobenzaprine for symptoms of  TMJ Recommend that patient schedule follow up with her dentist Left facial swelling resolved with steroid taper   RTC: As previously scheduled.     [name]  MSN, FNP-C Patient [facility] [address] [place], [place] [address] [phone]

## 2018-01-02 ENCOUNTER — Ambulatory Visit (INDEPENDENT_AMBULATORY_CARE_PROVIDER_SITE_OTHER)
Admission: RE | Admit: 2018-01-02 | Discharge: 2018-01-02 | Disposition: A | Discharge status: Home / Self Care | Payer: Medicare Other | Source: Ambulatory Visit | Attending: Pulmonary Disease | Admitting: Pulmonary Disease

## 2018-01-02 ENCOUNTER — Encounter: Payer: Self-pay | Admitting: Pulmonary Disease

## 2018-01-02 ENCOUNTER — Ambulatory Visit (INDEPENDENT_AMBULATORY_CARE_PROVIDER_SITE_OTHER): Payer: Medicare Other | Admitting: Pulmonary Disease

## 2018-01-02 VITALS — BP 106/72 | HR 98 | Ht 67.0 in | Wt 199.0 lb

## 2018-01-02 DIAGNOSIS — D869 Sarcoidosis, unspecified: Secondary | ICD-10-CM

## 2018-01-02 MED ORDER — PREDNISONE 10 MG PO TABS: ORAL_TABLET | ORAL | 0 refills | Status: DC

## 2018-01-02 NOTE — Progress Notes (Signed)
[facility]  Chief Complaint  Patient presents with  . Follow-up    Pt has two knots behind left ear, and chest pain and tightness. Increase of SOB with exertion, wheezing and breathing worsen in last month. Pt has productive cough- green and some blood. Pt has had nose bleeds in last month    Vital signs: BP 106/72 (BP Location: Left Arm, Cuff Size: Normal)   Pulse 98   Ht 5\' 7"  (1.702 m)   Wt 199 lb (90.3 kg)   SpO2 98%   BMI 31.17 kg/m   History of Present Illness: Rhonda Hester is a 42 y.o. female smoker with asthma, allergic rhinitis, and sarcoidosis (pulmonary, skin involvement, Bell's palsy) firsts dx in 2006.  She has more cough with clear to green sputum and blood streaks.  She had temperature up to 100.6.  She has been feeling sore.  She had gland swelling behind her left ear.  She was given course of prednisone and this helped.  She is off prednisone now.  She had throat irritation from dulera.  She has been using albuterol.   Physical Exam:  General - pleasant Eyes - pupils reactive ENT - no sinus tenderness, no oral exudate, no LAN Cardiac - regular, no murmur Chest - no wheeze, rales Abd - soft, non tender Ext - no edema Skin - no rashes Neuro - normal strength Psych - normal mood  Assessment/Plan:  Pulmonary sarcoidosis. - will get chest xray - give additional course of prednisone  Allergic asthma. - trouble tolerating dulera - continue prn albuterol   Patient Instructions  Chest xray today  Prednisone 10 mg pill >> 2 pills daily for 1 week, 1 pill daily for 1 week, 1/2 pill daily for 1 week  Follow up in 2 or 3 weeks with Dr. Halford Chessman or Nurse Practitioner    Chesley Mires, MD [place] 01/02/2018, 4:55 PM Pager:  [phone]  Flow Sheet  Pulmonary tests: RAST 07/22/11 >> Multiple allergens, IgE 71.8  PFT 09/02/11 >> FEV1 2.80(93%), FEV1% 82, TLC 4.74(89%), DLCO 59%, no  BD PFT 09/25/13 >> FEV1 2.75 (95%), FEV1% 81, TLC 4.25 (77%), DLCO 64%, no BD ACE 09/24/16 >> 219 PFT 11/24/16 >> FEV1 2.52 (89%), FEV1% 80, TLC 4.65 (84%), DLCO 56%, no BD ACE 04/25/17 >> 233  CT chests CT chest 12/30/04 >> b/l hilar and mediastinal adenopathy with b/l nodular interstitial opacities  CT chest 07/08/08 >> decreased lymph nodes, scarring with volume loss  CT chest 11/20/13 >>  Scattered areas of ATX CT chest 04/02/14 >> calcified Rt hilar LAN, scattered areas of scarring CT chest 10/01/16 >> stable scarring and traction BTX  Past Medical History: She  has a past medical history of Allergic asthma, Breast mass (05/2017), Eczema, GERD (gastroesophageal reflux disease), History of anal fissures, History of Bell's palsy, History of gastroesophageal reflux (GERD), Hyperlipidemia, Interstitial cystitis, Sarcoidosis of lung ([place]), Seasonal allergic rhinitis, and Shortness of breath.  Past Surgical History: She  has a past surgical history that includes Dilation and curettage of uterus (1997); CYSTOSCOPY/ HYDRODISTENTION/ BLADDER BX (06-09-2010); and cysto with hydrodistension (N/A, 08/24/2013).  Family History: Her family history includes Brain cancer in her maternal grandmother; Depression in her maternal aunt; Hyperlipidemia in her other; Seizures in her maternal uncle; Sleep apnea in her other.  Social History: She  reports that she quit smoking about 13 months ago. Her smoking use included cigarettes and cigars. She has a 1.80 pack-year smoking history.  she has never used smokeless tobacco. She reports that she does not drink alcohol or use drugs.  Medications: Allergies as of 01/02/2018      Reactions   Acyclovir And Related    Patient states it "made me feel like I can't breath"    Citric Acid Other (See Comments)   AVOIDS DUE TO INTERSTITIAL CYSTITIS   Dust Mite Extract Itching, Other (See Comments)    coughing   Grapeseed Extract [nutritional Supplements]    Throat  itching and burning   Pineapple    Pollen Extract Itching, Other (See Comments)    coughing   Nickel Rash      Medication List        Accurate as of 01/02/18  4:55 PM. Always use your most recent med list.          albuterol 108 (90 Base) MCG/ACT inhaler Commonly known as:  PROAIR HFA Inhale 2 puffs into the lungs every 6 (six) hours as needed for shortness of breath.   cyclobenzaprine 5 MG tablet Commonly known as:  FLEXERIL Take 1 tablet (5 mg total) by mouth at bedtime.   FLUoxetine 20 MG capsule Commonly known as:  PROZAC Take 1 capsule (20 mg total) by mouth daily.   gabapentin 300 MG capsule Commonly known as:  NEURONTIN Take 1 capsule (300 mg total) by mouth 3 (three) times daily.   hydrocortisone 25 MG suppository Commonly known as:  ANUSOL-HC Place 1 suppository (25 mg total) rectally 2 (two) times daily.   LUTEIN PO Take 1 tablet by mouth daily.   mometasone 50 MCG/ACT nasal spray Commonly known as:  NASONEX Place 2 sprays into the nose daily.   mometasone-formoterol 100-5 MCG/ACT Aero Commonly known as:  DULERA Inhale 2 puffs into the lungs 2 (two) times daily.   predniSONE 10 MG tablet Commonly known as:  DELTASONE 2 pills daily for 1 week, 1 pill daily for 1 week, 1/2 pill daily for 1 week   sodium chloride 0.65 % Soln nasal spray Commonly known as:  OCEAN Place 1 spray into both nostrils as needed for congestion.   traMADol 50 MG tablet Commonly known as:  ULTRAM Take 1 tablet (50 mg total) by mouth every 6 (six) hours as needed.

## 2018-01-02 NOTE — Patient Instructions (Signed)
Chest xray today  Prednisone 10 mg pill >> 2 pills daily for 1 week, 1 pill daily for 1 week, 1/2 pill daily for 1 week  Follow up in 2 or 3 weeks with Dr. Halford Chessman or Nurse Practitioner

## 2018-01-06 ENCOUNTER — Telehealth: Payer: Self-pay | Admitting: Pulmonary Disease

## 2018-01-06 NOTE — Telephone Encounter (Signed)
Called and spoke with patient today regarding results and recommendations.  Informed the patient of results today. The patient verbalized understanding   Patient would like VS to advise the location of the change with pt's sarcoidosis Pt reports of severe pain and joint pain that is different from regular sarcoidosis pain Routing message to VS  VS please advise

## 2018-01-06 NOTE — Telephone Encounter (Signed)
Dg Chest 2 View  Result Date: 01/02/2018 CLINICAL DATA:  42 year old female with cough and shortness of breath for 4 days. Left anterior chest pain. Sarcoidosis. EXAM: CHEST  2 VIEW COMPARISON:  Chest radiograph 05/30/2017 and earlier. FINDINGS: Stable the slightly lower lung volumes. Chronic perihilar and other scattered pulmonary architectural distortion appears stable since 2017-03-29, most pronounced in the right upper lung and about the left hilum. Mediastinal contours are otherwise stable and within normal limits. Visualized tracheal air column is within normal limits. No pneumothorax, pulmonary edema, pleural effusion or consolidation. No definite acute pulmonary opacity. No acute osseous abnormality identified. Negative visible bowel gas pattern. IMPRESSION: Chronic pulmonary sarcoidosis. Mildly lower lung volumes, but no acute cardiopulmonary abnormality identified. Electronically Signed   By: Genevie Ann M.D.   On: 01/02/2018 21:27    Please let her know CXR shows stable changes from sarcoidosis.  She should continue course of prednisone prescribed from last visit.

## 2018-01-11 NOTE — Telephone Encounter (Signed)
Routing message to VS for review Patient would like VS to advise where the location of the change is with pt's sarcoidosis per results Pt reports of severe pain and joint pain that is different from regular sarcoidosis pain Pt states she would like to know what could be causing the new pain  VS please advise

## 2018-01-23 ENCOUNTER — Telehealth: Payer: Self-pay | Admitting: Pediatrics

## 2018-01-23 NOTE — Telephone Encounter (Signed)
Pt's ins denied [place] for [name].  Would you like [place] to initiate appeal for this med?

## 2018-01-25 ENCOUNTER — Ambulatory Visit: Payer: Self-pay | Admitting: Pulmonary Disease

## 2018-01-30 ENCOUNTER — Ambulatory Visit (HOSPITAL_COMMUNITY): Payer: Self-pay | Admitting: Psychiatry

## 2018-01-30 NOTE — Telephone Encounter (Signed)
Called and spoke with patient Pt advised the pain has subsided of the sacoidisis and eased up slightly Nothing further needed

## 2018-01-31 ENCOUNTER — Ambulatory Visit: Payer: Medicare Other

## 2018-02-06 ENCOUNTER — Telehealth: Payer: Self-pay

## 2018-02-06 ENCOUNTER — Encounter: Payer: Self-pay | Admitting: *Deleted

## 2018-02-06 NOTE — Telephone Encounter (Signed)
Returned call and advised pt that we are working on appeal for [name].

## 2018-02-09 ENCOUNTER — Ambulatory Visit (INDEPENDENT_AMBULATORY_CARE_PROVIDER_SITE_OTHER): Payer: Medicare Other | Admitting: Family Medicine

## 2018-02-09 ENCOUNTER — Encounter: Payer: Self-pay | Admitting: Family Medicine

## 2018-02-09 VITALS — BP 130/80 | HR 98 | Temp 98.3°F | Resp 14 | Ht 67.0 in | Wt 201.0 lb

## 2018-02-09 DIAGNOSIS — R0982 Postnasal drip: Secondary | ICD-10-CM | POA: Diagnosis not present

## 2018-02-09 DIAGNOSIS — K649 Unspecified hemorrhoids: Secondary | ICD-10-CM | POA: Diagnosis not present

## 2018-02-09 DIAGNOSIS — K625 Hemorrhage of anus and rectum: Secondary | ICD-10-CM | POA: Diagnosis not present

## 2018-02-09 DIAGNOSIS — K5900 Constipation, unspecified: Secondary | ICD-10-CM

## 2018-02-09 MED ORDER — LACTULOSE 10 GM/15ML PO SOLN: 10.0000 g | Freq: Every day | ORAL | 0 refills | Status: DC | PRN

## 2018-02-09 MED ORDER — HYDROCORTISONE ACETATE 25 MG RE SUPP: 25.0000 mg | Freq: Two times a day (BID) | RECTAL | 0 refills | Status: DC

## 2018-02-09 NOTE — Progress Notes (Signed)
Subjective:     Rhonda Hester is a 42 y.o. female who presents for evaluation of possible hemorrhoidsThe onset of symptoms was approximately 1 month ago. Patient has a history of external hemorrhoids 6 months ago. She endorses periodic constipation and hard stools. Akya has not attempted any OTC interventions to alleviate symptoms. Symptoms include: bleeding which only occurs with bowel movements and constipation. Patient denies history of previous STDs, known or suspected STD exposure, maroon colored stools, receptive anal intercourse and weight loss.   Patient is complaining of post nasal drip. She complains of clear drainage over the past few days. She has a history of allergic rhinitis. She has not taken cetirizine as prescribed or utilized any OTC interventions.   Current Meds  Medication Sig  . hydrocortisone (ANUSOL-HC) 25 MG suppository Place 1 suppository (25 mg total) rectally 2 (two) times daily.  . mometasone (NASONEX) 50 MCG/ACT nasal spray Place 2 sprays into the nose daily.  . [DISCONTINUED] hydrocortisone (ANUSOL-HC) 25 MG suppository Place 1 suppository (25 mg total) rectally 2 (two) times daily.   Current Facility-Administered Medications for the 02/09/18 encounter (Office Visit) with Dorena Dew, FNP  Medication  . leuprolide ([name]) injection 11.25 mg  . leuprolide ([name]) injection 3.75 mg   Past Medical History:  Diagnosis Date  . Allergic asthma   . Breast mass 05/2017   lt breast lump  . Eczema   . GERD (gastroesophageal reflux disease)   . History of anal fissures   . History of Bell's palsy    2006  RIGHT SIDE--  RESOLVED  . History of gastroesophageal reflux (GERD)   . Hyperlipidemia   . Interstitial cystitis   . Sarcoidosis of lung ([place])    DX 2006--  PULMOLOGIST--  DR QPRF  . Seasonal allergic rhinitis   . Shortness of breath    Social History   Socioeconomic History  . Marital status: Single    Spouse name: Not on file  . Number of  children: Not on file  . Years of education: Not on file  . Highest education level: Not on file  Social Needs  . Financial resource strain: Not on file  . Food insecurity - worry: Not on file  . Food insecurity - inability: Not on file  . Transportation needs - medical: Not on file  . Transportation needs - non-medical: Not on file  Occupational History  . Not on file  Tobacco Use  . Smoking status: Former Smoker    Packs/day: 0.30    Years: 6.00    Pack years: 1.80    Types: Cigarettes, Cigars    Last attempt to quit: 11/2016    Years since quitting: 1.2  . Smokeless tobacco: Never Used  . Tobacco comment: ONE CIG. DAILY /  1 PPMONTH  Substance and Sexual Activity  . Alcohol use: No    Alcohol/week: 0.0 oz  . Drug use: No    Comment: HX MARIJUANA USE  . Sexual activity: No    Birth control/protection: None    Comment: abstinence   Other Topics Concern  . Not on file  Social History Narrative  . Not on file   Immunization History  Administered Date(s) Administered  . Influenza Whole 09/02/2011, 09/26/2012  . Influenza,inj,Quad PF,6+ Mos 12/27/2012, 09/04/2015, 09/20/2017  . Pneumococcal Polysaccharide-23 05/10/2016  . Tdap 05/10/2016   Review of Systems  Constitutional: Negative.   HENT: Positive for congestion. Negative for ear pain and sore throat.   Respiratory: Negative.  Cardiovascular: Negative.   Gastrointestinal: Positive for blood in stool.  Genitourinary: Negative.   Musculoskeletal: Negative.   Skin: Negative.   Neurological: Negative.   Endo/Heme/Allergies: Negative.   Psychiatric/Behavioral: Negative.      Objective:     Physical Exam  Constitutional: She is oriented to person, place, and time. She appears well-developed and well-nourished.  HENT:  Head: Normocephalic and atraumatic.  Right Ear: External ear normal.  Left Ear: External ear normal.  Nose: No mucosal edema or rhinorrhea.  Eyes: Pupils are equal, round, and reactive to  light.  Cardiovascular: Normal rate, regular rhythm, normal heart sounds and intact distal pulses.  Pulmonary/Chest: Effort normal and breath sounds normal.  Abdominal: Soft. Bowel sounds are normal.  Genitourinary: Rectal exam shows external hemorrhoid and tenderness. Rectal exam shows no fissure, no mass and anal tone normal.  Musculoskeletal: Normal range of motion.  Neurological: She is alert and oriented to person, place, and time.  Skin: Skin is warm and dry.   Assessment:    External hemorrhoid   Plan:  1. Hemorrhoids, unspecified hemorrhoid type - hydrocortisone (ANUSOL-HC) 25 MG suppository; Place 1 suppository (25 mg total) rectally 2 (two) times daily.  Dispense: 12 suppository; Refill: 0 - IFOBT POC (occult bld, rslt in office); Future - lactulose (CHRONULAC) 10 GM/15ML solution; Take 15 mLs (10 g total) by mouth daily as needed for moderate constipation.  Dispense: 240 mL; Refill: 0  2. Constipation, unspecified constipation type Will start a trial of lactulose for constipation - lactulose (CHRONULAC) 10 GM/15ML solution; Take 15 mLs (10 g total) by mouth daily as needed for moderate constipation.  Dispense: 240 mL; Refill: 0  3. Bright red rectal bleeding I suspect that rectal bleeding is related to thrombosed hemorrhoid.  - CBC  Discussed the diagnosis with the patient, including plans for treatment and expected course. Topical corticosteroid per medication orders.    4. Post nasal drip Continue cetirizine at bedtime as prescribed Recommend nasal saline twice daily as needed for periodic nasal congestion   RTC: As previously scheduled for chronic conditions   [name]  MSN, FNP-C [facility] [address] [place], [place] [address] [phone]

## 2018-02-09 NOTE — Patient Instructions (Addendum)
For hemorrhoids we will start hydrocortisone suppositories twice daily for 7 days.  For chronic constipation we will start a trial of lactulose daily for 5 days.  Also increase water intake to 6-8 glasses/day and fiber intake.  Sources of fiber include green leafy vegetables and foods.  Also exercise daily to assist with constipation. For post nasal drip, recommend that you continue Cetirizine 10 mg at bedtime. For periodic nasal congestion, OTC nasal saline twice daily as needed.   Hydrocortisone suppositories What is this medicine? HYDROCORTISONE (hye droe KOR ti sone) is a corticosteroid. It is used to decrease swelling, itching, and pain that is caused by minor skin irritations or by hemorrhoids. This medicine may be used for other purposes; ask your health care provider or pharmacist if you have questions. COMMON BRAND NAME(S): Anucort-HC, Anumed-HC, Anusol HC, Encort, GRx HiCort, Hemmorex-HC, Hemorrhoidal-HC, Hemril, Proctocort, Proctosert HC, Proctosol-HC, Rectacort HC, Rectasol-HC What should I tell my health care provider before I take this medicine? They need to know if you have any of these conditions: -an unusual or allergic reaction to hydrocortisone, corticosteroids, other medicines, foods, dyes, or preservatives -pregnant or trying to get pregnant -breast-feeding How should I use this medicine? This medicine is for rectal use only. Do not take by mouth. Wash your hands before and after use. Take off the foil wrapping. Wet the tip of the suppository with cold tap water to make it easier to use. Lie on your side with your lower leg straightened out and your upper leg bent forward toward your stomach. Lift upper buttock to expose the rectal area. Apply gentle pressure to insert the suppository completely into the rectum, pointed end first. Hold buttocks together for a few seconds. Remain lying down for about 15 minutes to avoid having the suppository come out. Do not use more often than  directed. Talk to your pediatrician regarding the use of this medicine in children. Special care may be needed. Overdosage: If you think you have taken too much of this medicine contact a poison control center or emergency room at once. NOTE: This medicine is only for you. Do not share this medicine with others. What if I miss a dose? If you miss a dose, use it as soon as you can. If it is almost time for your next dose, use only that dose. Do not use double or extra doses. What may interact with this medicine? Interactions are not expected. Do not use any other rectal products on the affected area without telling your doctor or health care professional. This list may not describe all possible interactions. Give your health care provider a list of all the medicines, herbs, non-prescription drugs, or dietary supplements you use. Also tell them if you smoke, drink alcohol, or use illegal drugs. Some items may interact with your medicine. What should I watch for while using this medicine? Visit your doctor or health care professional for regular checks on your progress. Tell your doctor or health care professional if your symptoms do not improve after a few days of use. Do not use if there is blood in your stools. If you get any type of infection while using this medicine, you may need to stop using this medicine until our infections clears up. Ask your doctor or health care professional for advice. What side effects may I notice from receiving this medicine? Side effects that you should report to your doctor or health care professional as soon as possible: -bloody or black, tarry stools -painful, red,  pus filled blisters in hair follicles -rectal pain, burning or bleeding after use of medicine Side effects that usually do not require medical attention (report to your doctor or health care professional if they continue or are bothersome): -changes in skin color -dry skin -itching or irritation This  list may not describe all possible side effects. Call your doctor for medical advice about side effects. You may report side effects to FDA at 1-800-FDA-1088. Where should I keep my medicine? Keep out of the reach of children. Store at room temperature between 20 and 25 degrees C (68 and 77 degrees F). Protect from heat and freezing. Throw away any unused medicine after the expiration date. NOTE: This sheet is a summary. It may not cover all possible information. If you have questions about this medicine, talk to your doctor, pharmacist, or health care provider.  2018 Elsevier/Gold Standard (2008-04-26 16:07:24)

## 2018-02-10 ENCOUNTER — Ambulatory Visit: Payer: Self-pay | Admitting: Pulmonary Disease

## 2018-02-10 ENCOUNTER — Telehealth: Payer: Self-pay

## 2018-02-10 LAB — CBC
HEMATOCRIT: 33.7 % — AB (ref 34.0–46.6)
HEMOGLOBIN: 10.1 g/dL — AB (ref 11.1–15.9)
MCH: 24.4 pg — ABNORMAL LOW (ref 26.6–33.0)
MCHC: 30 g/dL — AB (ref 31.5–35.7)
MCV: 81 fL (ref 79–97)
Platelets: 408 10*3/uL — ABNORMAL HIGH (ref 150–379)
RBC: 4.14 x10E6/uL (ref 3.77–5.28)
RDW: 17.8 % — ABNORMAL HIGH (ref 12.3–15.4)
WBC: 8.2 10*3/uL (ref 3.4–10.8)

## 2018-02-10 NOTE — Telephone Encounter (Signed)
-----   Message from Dorena Dew, [facility] sent at 02/10/2018  2:38 PM EST ----- Regarding: lab results Please inform patient that she is mildly anemic.  I will not start medication at this point.  Recommend an iron rich diet that includes green leafy vegetables, organ meats and increase protein sources.  Make sure that she is adherent to medication regimen that was prescribed during appointment on 02/09/2018.   Please follow-up as scheduled  Thanks

## 2018-02-10 NOTE — Telephone Encounter (Signed)
Called and spoke with patient, advised that she is mildly anemic but does not need to start medication at this time. Advised that she should eat a diet rich in iron sources such as green leafy vegetables, organ meats, and increase protein sources. Advised that she should continue current medications as discussed during appointment and keep next scheduled appointment. Thanks!

## 2018-02-13 ENCOUNTER — Ambulatory Visit: Payer: Self-pay | Admitting: Pulmonary Disease

## 2018-02-13 DIAGNOSIS — K649 Unspecified hemorrhoids: Secondary | ICD-10-CM | POA: Insufficient documentation

## 2018-02-15 ENCOUNTER — Ambulatory Visit: Payer: Self-pay | Admitting: Pulmonary Disease

## 2018-02-21 ENCOUNTER — Ambulatory Visit: Payer: Self-pay | Admitting: Adult Health

## 2018-02-27 ENCOUNTER — Telehealth: Payer: Self-pay

## 2018-02-27 NOTE — Telephone Encounter (Addendum)
TC from pt requesting status of Depo [name]. [facility] "Mickel Baas" states they haven't been able to reach pt to confirm shipment to our office. TC to pt with [facility] on the line. After long call, [facility] will overnight pt's injection and it will be here tomorrow. I confirmed with [facility] she has rf's good til the end of year.

## 2018-02-28 ENCOUNTER — Telehealth: Payer: Self-pay

## 2018-02-28 NOTE — Telephone Encounter (Addendum)
Informed pt [name] has arrived to the office. Pt not available for an appt today. She agrees to an appt tomorrow.

## 2018-03-01 ENCOUNTER — Ambulatory Visit (INDEPENDENT_AMBULATORY_CARE_PROVIDER_SITE_OTHER): Payer: Medicare Other

## 2018-03-01 DIAGNOSIS — Z3202 Encounter for pregnancy test, result negative: Secondary | ICD-10-CM

## 2018-03-01 DIAGNOSIS — N809 Endometriosis, unspecified: Secondary | ICD-10-CM

## 2018-03-01 NOTE — Progress Notes (Signed)
Agree with A & P. 

## 2018-03-01 NOTE — Progress Notes (Signed)
Patient is in the office for depo lupron, administered and pt tolerated well .Marland Kitchen Administrations This Visit    leuprolide ([name]) injection 11.25 mg    Admin Date 03/01/2018 Action Given Dose 11.25 mg Route Intramuscular Administered By Hinton Lovely, RN

## 2018-03-02 LAB — POCT URINE PREGNANCY: Preg Test, Ur: NEGATIVE

## 2018-03-02 NOTE — Addendum Note (Signed)
Addended by: Tristan Schroeder D on: 03/02/2018 02:42 PM   Modules accepted: Orders

## 2018-03-10 ENCOUNTER — Other Ambulatory Visit: Payer: Self-pay

## 2018-03-10 DIAGNOSIS — Z1231 Encounter for screening mammogram for malignant neoplasm of breast: Secondary | ICD-10-CM

## 2018-03-17 ENCOUNTER — Encounter: Payer: Self-pay | Admitting: Family Medicine

## 2018-03-17 ENCOUNTER — Ambulatory Visit (INDEPENDENT_AMBULATORY_CARE_PROVIDER_SITE_OTHER): Payer: Medicare Other | Admitting: Family Medicine

## 2018-03-17 VITALS — BP 122/80 | HR 95 | Temp 98.4°F | Resp 16 | Ht 67.0 in | Wt 199.0 lb

## 2018-03-17 DIAGNOSIS — M7989 Other specified soft tissue disorders: Secondary | ICD-10-CM

## 2018-03-17 DIAGNOSIS — M26609 Unspecified temporomandibular joint disorder, unspecified side: Secondary | ICD-10-CM | POA: Diagnosis not present

## 2018-03-17 DIAGNOSIS — K148 Other diseases of tongue: Secondary | ICD-10-CM

## 2018-03-17 DIAGNOSIS — R52 Pain, unspecified: Secondary | ICD-10-CM

## 2018-03-17 DIAGNOSIS — G8929 Other chronic pain: Secondary | ICD-10-CM | POA: Diagnosis not present

## 2018-03-17 DIAGNOSIS — J3089 Other allergic rhinitis: Secondary | ICD-10-CM

## 2018-03-17 MED ORDER — CETIRIZINE HCL 10 MG PO TABS: 10.0000 mg | ORAL_TABLET | Freq: Every day | ORAL | 11 refills | Status: DC

## 2018-03-17 MED ORDER — GABAPENTIN 300 MG PO CAPS: 300.0000 mg | ORAL_CAPSULE | Freq: Two times a day (BID) | ORAL | 2 refills | Status: DC

## 2018-03-17 NOTE — Progress Notes (Signed)
Subjective:    Patient ID: Rhonda Hester, female    DOB: 1976-08-09, 42 y.o.   MRN: [id number]  HPI Rhonda Hester, a 42 year old female with a history sarcoidosis of lung, chronic pain, and GERD presents complaining of left facial swelling and right jaw clicking. Rhonda Hester says that pain has been worsening over the past several days. Left facial swelling has been occurring intermittently over the past 2-3 months. Patient denies a history of temporal mandibular joint syndrome and has regular dental exams twice yearly. She was previously trialed on a course of steroids and muscle relaxants for this problem with maximum relief.   Pain intensity is 5/10 characterized as aching and intermittent. Patient denies fever, shortness of breath, headache, dizziness, dysuria, abdominal pain, nausea, vomiting,or diarrhea.   Patient is also complaining of swelling to left neck and face over the past several days. She has not identified any palliative or provocative factors related to left facial swelling. Patient denies fever, fatigue, face pain, nausea, vomiting, or diarrhea.   Past Medical History:  Diagnosis Date  . Allergic asthma   . Breast mass 05/2017   lt breast lump  . Eczema   . GERD (gastroesophageal reflux disease)   . History of anal fissures   . History of Bell's palsy    2006  RIGHT SIDE--  RESOLVED  . History of gastroesophageal reflux (GERD)   . Hyperlipidemia   . Interstitial cystitis   . Sarcoidosis of lung ([place])    DX 2006--  PULMOLOGIST--  DR MEQA  . Seasonal allergic rhinitis   . Shortness of breath    Social History   Socioeconomic History  . Marital status: Single    Spouse name: Not on file  . Number of children: Not on file  . Years of education: Not on file  . Highest education level: Not on file  Occupational History  . Not on file  Social Needs  . Financial resource strain: Not on file  . Food insecurity:    Worry: Not on file    Inability: Not on file    . Transportation needs:    Medical: Not on file    Non-medical: Not on file  Tobacco Use  . Smoking status: Former Smoker    Packs/day: 0.30    Years: 6.00    Pack years: 1.80    Types: Cigarettes, Cigars    Last attempt to quit: 11/2016    Years since quitting: 1.3  . Smokeless tobacco: Never Used  . Tobacco comment: ONE CIG. DAILY /  1 PPMONTH  Substance and Sexual Activity  . Alcohol use: No    Alcohol/week: 0.0 oz  . Drug use: No    Comment: HX MARIJUANA USE  . Sexual activity: Never    Birth control/protection: None    Comment: abstinence   Lifestyle  . Physical activity:    Days per week: Not on file    Minutes per session: Not on file  . Stress: Not on file  Relationships  . Social connections:    Talks on phone: Not on file    Gets together: Not on file    Attends religious service: Not on file    Active member of club or organization: Not on file    Attends meetings of clubs or organizations: Not on file    Relationship status: Not on file  . Intimate partner violence:    Fear of current or ex partner: Not on file  Emotionally abused: Not on file    Physically abused: Not on file    Forced sexual activity: Not on file  Other Topics Concern  . Not on file  Social History Narrative  . Not on file   Review of Systems  HENT: Positive for facial swelling.        Left facial swelling  Cardiovascular: Negative.   Gastrointestinal: Negative.   Musculoskeletal: Positive for arthralgias and joint swelling.  Skin: Negative.   Allergic/Immunologic: Positive for environmental allergies.  Hematological: Negative.   Psychiatric/Behavioral: Negative.        Objective:   Physical Exam  HENT:  Head:    Mouth/Throat: Abnormal dentition. No uvula swelling.  Facial locking and catching of right jaw.  Slight clicking of right jaw   Left facial nodule, tender to palpation over parotid           BP 122/80 (BP Location: Right Arm, Patient Position:  Sitting, Cuff Size: Large)   Pulse 95   Temp 98.4 F (36.9 C) (Oral)   Resp 16   Ht 5\' 7"  (1.702 m)   Wt 199 lb (90.3 kg)   SpO2 97%   BMI 31.17 kg/m   Assessment & Plan:  TMJ (temporomandibular joint disorder) Suspect temporomandibular joint syndrome or TMJ. Will use conservative treatment at this point continue home nonsteroidal anti-inflammatories as directed.  Be aware of any teeth clenching or grinding habits, relax jaw by disengaging her teeth.  Try to avoid any uncontrolled opening such as yawning.  Will start a trial of cyclobenzaprine for symptoms of  TMJ Recommend that patient scheduled follow up with her dentist  2. Nodule of soft tissue Nodule is round, raised, and movable. Patient warrants an ultrasound to soft tissue for further evaluation.  - US Soft Tissue Head/Neck; Future  3. Tongue lesion Not visualized on physical exam, patient advised to use oral rinse BID after brushing teeth.   4. Chronic generalized pain Will start a trial of gabapentin for this problem. I suspect fibromyalgia due to wide spread pain and sensitivity to touch.  - gabapentin (NEURONTIN) 300 MG capsule; Take 1 capsule (300 mg total) by mouth 2 (two) times daily.  Dispense: 60 capsule; Refill: 2  5. Allergic rhinitis due to other allergic trigger, unspecified seasonality - cetirizine (ZYRTEC) 10 MG tablet; Take 1 tablet (10 mg total) by mouth daily.  Dispense: 30 tablet; Refill: 11   RTC: Will schedule follow up after reviewing ultrasound  [name]  MSN, FNP-C [facility] [address] [place], [place] [address] [phone]

## 2018-03-17 NOTE — Patient Instructions (Signed)
Will continue Gabapentin 300 mg twice daily for generalized.  Will review an ultrasound of head/neck (soft tissue), will follow up by phone.   Will start a trial cetirizine 10 mg for allergies

## 2018-03-22 DIAGNOSIS — M7989 Other specified soft tissue disorders: Secondary | ICD-10-CM | POA: Insufficient documentation

## 2018-03-24 ENCOUNTER — Telehealth: Payer: Self-pay | Admitting: Family Medicine

## 2018-03-24 ENCOUNTER — Other Ambulatory Visit: Payer: Self-pay | Admitting: Family Medicine

## 2018-03-24 ENCOUNTER — Ambulatory Visit (HOSPITAL_COMMUNITY)
Admission: RE | Admit: 2018-03-24 | Discharge: 2018-03-24 | Disposition: A | Discharge status: Home / Self Care | Payer: Medicare Other | Source: Ambulatory Visit | Attending: Family Medicine | Admitting: Family Medicine

## 2018-03-24 DIAGNOSIS — R590 Localized enlarged lymph nodes: Secondary | ICD-10-CM

## 2018-03-24 DIAGNOSIS — M7989 Other specified soft tissue disorders: Secondary | ICD-10-CM | POA: Diagnosis present

## 2018-03-24 NOTE — Telephone Encounter (Signed)
Rhonda Hester presented on 03/17/2018, with a complaint of left facial swelling tender to palpation. An ultrasound of neck and soft tissue was warranted and showed the following:    IMPRESSION: The palpable abnormality demonstrates multiple lymph nodes. The largest short axis diameter is 1.1 cm. This is pathologically enlarged by short axis diameter measurement criteria.  Patient may warrant lymph node biopsy at this juncture. Requested an oncology consult.   [name]  MSN, FNP-C [facility] [address] [place],  [address] [phone]

## 2018-03-24 NOTE — Progress Notes (Signed)
Orders Placed This Encounter  Procedures  . Ambulatory referral to Oncology    Referral Priority:   Routine    Referral Type:   Consultation    Referral Reason:   Specialty Services Required    Number of Visits Requested:   [place]  MSN, FNP-C [facility] [address] [place], [place] [address] [phone]

## 2018-03-28 ENCOUNTER — Other Ambulatory Visit: Payer: Self-pay | Admitting: Family Medicine

## 2018-03-28 ENCOUNTER — Encounter (HOSPITAL_COMMUNITY): Payer: Self-pay | Admitting: Psychiatry

## 2018-03-28 ENCOUNTER — Ambulatory Visit (INDEPENDENT_AMBULATORY_CARE_PROVIDER_SITE_OTHER): Payer: Medicare Other | Admitting: Psychiatry

## 2018-03-28 DIAGNOSIS — G8929 Other chronic pain: Secondary | ICD-10-CM | POA: Diagnosis not present

## 2018-03-28 DIAGNOSIS — Z87891 Personal history of nicotine dependence: Secondary | ICD-10-CM

## 2018-03-28 DIAGNOSIS — R52 Pain, unspecified: Secondary | ICD-10-CM | POA: Diagnosis not present

## 2018-03-28 DIAGNOSIS — G894 Chronic pain syndrome: Secondary | ICD-10-CM

## 2018-03-28 DIAGNOSIS — F331 Major depressive disorder, recurrent, moderate: Secondary | ICD-10-CM

## 2018-03-28 DIAGNOSIS — R161 Splenomegaly, not elsewhere classified: Secondary | ICD-10-CM

## 2018-03-28 DIAGNOSIS — Z818 Family history of other mental and behavioral disorders: Secondary | ICD-10-CM | POA: Diagnosis not present

## 2018-03-28 MED ORDER — DULOXETINE HCL 30 MG PO CPEP: ORAL_CAPSULE | ORAL | 0 refills | Status: DC

## 2018-03-28 MED ORDER — GABAPENTIN 300 MG PO CAPS: 900.0000 mg | ORAL_CAPSULE | Freq: Every day | ORAL | 1 refills | Status: DC

## 2018-03-28 NOTE — Progress Notes (Signed)
[facility] MD/PA/NP OP Progress Note  03/28/2018 10:15 AM Rhonda Hester  MRN:  [id number]  Chief Complaint:  nothing is working, I stopped it  HPI: Rhonda Hester presents for medication management follow-up.  I have not seen the patient in almost 6 months, and she canceled her appointment in February.  She reports today that she almost decided not to come because she does not think any of the medicines are helping.  Spent time with her expressing that physicians are only able to provide help and assistance of patients show up.  She also shares that she discontinued Prozac, had not notified anybody at the clinic.  Reports that she continues to be depressed.  From writer's perspective she appears more depressed and irritable than her last visit.  We spent time discussing her reasons for discontinuing Prozac, and her ongoing struggle with chronic pain.  I suggested she speak with her primary care provider about referral to a chronic pain clinic.  With regard to chronic neuropathic pain, and depression, we agreed to initiate Cymbalta and titrate aggressively to see if this can provide her with some relief.  She had been on Cymbalta in the past, at a low dose of 20 mg, we agreed to titrate rapidly to 60 mg.  With regard to gabapentin, she reports that she is prescribed twice a day but she also stopped taking this.  We agreed to restart at 900 mg nightly to specifically target sleep and pain that is disrupting her sleep.  We will follow-up in 8 weeks or sooner if needed.  I encouraged the patient to activate her my chart account so that she can communicate with providers regarding her care.  Visit Diagnosis:    ICD-10-CM   1. Moderate episode of recurrent major depressive disorder (HCC) F33.1 DULoxetine (CYMBALTA) 30 MG capsule  2. Chronic pain syndrome G89.4 DULoxetine (CYMBALTA) 30 MG capsule  3. Chronic generalized pain R52 gabapentin (NEURONTIN) 300 MG capsule   G89.29     Past Psychiatric  History: See intake H&P for full details. Reviewed, with no updates at this time.   Past Medical History:  Past Medical History:  Diagnosis Date  . Allergic asthma   . Breast mass 05/2017   lt breast lump  . Eczema   . GERD (gastroesophageal reflux disease)   . History of anal fissures   . History of Bell's palsy    2006  RIGHT SIDE--  RESOLVED  . History of gastroesophageal reflux (GERD)   . Hyperlipidemia   . Interstitial cystitis   . Sarcoidosis of lung ([place])    DX 2006--  PULMOLOGIST--  DR VOZD  . Seasonal allergic rhinitis   . Shortness of breath     Past Surgical History:  Procedure Laterality Date  . CYSTO WITH HYDRODISTENSION N/A 08/24/2013   Procedure: CYSTOSCOPY/HYDRODISTENSION with instillation of marcaine and pyridium;  Surgeon: Fredricka Bonine, MD;  [facility];  Service: Urology;  Laterality: N/A;  . CYSTOSCOPY/ HYDRODISTENTION/ BLADDER BX  06-09-2010  . DILATION AND CURETTAGE OF UTERUS  1997    Family Psychiatric History: See intake H&P for full details. Reviewed, with no updates at this time.   Family History:  Family History  Problem Relation Age of Onset  . Brain cancer Maternal Grandmother   . Hyperlipidemia Other   . Sleep apnea Other   . Depression Maternal Aunt   . Seizures Maternal Uncle     Social History:  Social History   Socioeconomic History  .  Marital status: Single    Spouse name: Not on file  . Number of children: Not on file  . Years of education: Not on file  . Highest education level: Not on file  Occupational History  . Not on file  Social Needs  . Financial resource strain: Not on file  . Food insecurity:    Worry: Not on file    Inability: Not on file  . Transportation needs:    Medical: Not on file    Non-medical: Not on file  Tobacco Use  . Smoking status: Former Smoker    Packs/day: 0.30    Years: 6.00    Pack years: 1.80    Types: Cigarettes, Cigars    Last attempt to quit:  11/2016    Years since quitting: 1.3  . Smokeless tobacco: Never Used  . Tobacco comment: ONE CIG. DAILY /  1 PPMONTH  Substance and Sexual Activity  . Alcohol use: No    Alcohol/week: 0.0 oz  . Drug use: No    Comment: HX MARIJUANA USE  . Sexual activity: Never    Birth control/protection: None    Comment: abstinence   Lifestyle  . Physical activity:    Days per week: Not on file    Minutes per session: Not on file  . Stress: Not on file  Relationships  . Social connections:    Talks on phone: Not on file    Gets together: Not on file    Attends religious service: Not on file    Active member of club or organization: Not on file    Attends meetings of clubs or organizations: Not on file    Relationship status: Not on file  Other Topics Concern  . Not on file  Social History Narrative  . Not on file    Allergies:  Allergies  Allergen Reactions  . Acyclovir And Related     Patient states it "made me feel like I can't breath"   . Citric Acid Other (See Comments)    AVOIDS DUE TO INTERSTITIAL CYSTITIS  . Dust Mite Extract Itching and Other (See Comments)     coughing  . Grapeseed Extract [Nutritional Supplements]     Throat itching and burning  . Pineapple   . Pollen Extract Itching and Other (See Comments)     coughing  . Nickel Rash    Metabolic Disorder Labs: Lab Results  Component Value Date   HGBA1C 5.6 08/09/2017   MPG 114 08/09/2017   No results found for: PROLACTIN Lab Results  Component Value Date   CHOL 152 08/09/2017   TRIG 90 08/09/2017   HDL 38 (L) 08/09/2017   CHOLHDL 4.0 08/09/2017   VLDL 18 08/09/2017   LDLCALC 96 08/09/2017   Lab Results  Component Value Date   TSH 1.14 05/10/2016   TSH 1.553 11/01/2012    Therapeutic Level Labs: No results found for: LITHIUM No results found for: VALPROATE No components found for:  CBMZ  Current Medications: Current Outpatient Medications  Medication Sig Dispense Refill  . albuterol (PROAIR  HFA) 108 (90 BASE) MCG/ACT inhaler Inhale 2 puffs into the lungs every 6 (six) hours as needed for shortness of breath. (Patient not taking: Reported on 02/09/2018) 1 Inhaler 5  . cetirizine (ZYRTEC) 10 MG tablet Take 1 tablet (10 mg total) by mouth daily. 30 tablet 11  . cyclobenzaprine (FLEXERIL) 5 MG tablet Take 1 tablet (5 mg total) by mouth at bedtime. (Patient not taking: Reported on  02/09/2018) 30 tablet 0  . DULoxetine (CYMBALTA) 30 MG capsule Take 1 capsule (30 mg total) by mouth daily for 7 days, THEN 1 capsule (30 mg total) 2 (two) times daily. 180 capsule 0  . gabapentin (NEURONTIN) 300 MG capsule Take 3 capsules (900 mg total) by mouth at bedtime. 90 capsule 1  . LUTEIN PO Take 1 tablet by mouth daily.    . mometasone (NASONEX) 50 MCG/ACT nasal spray Place 2 sprays into the nose daily.    . mometasone-formoterol (DULERA) 100-5 MCG/ACT AERO Inhale 2 puffs into the lungs 2 (two) times daily. (Patient not taking: Reported on 02/09/2018) 1 Inhaler 5  . predniSONE (DELTASONE) 20 MG tablet Take 20 mg by mouth daily with breakfast.    . sodium chloride (OCEAN) 0.65 % SOLN nasal spray Place 1 spray into both nostrils as needed for congestion.     Current Facility-Administered Medications  Medication Dose Route Frequency Provider Last Rate Last Dose  . leuprolide ([name]) injection 11.25 mg  11.25 mg Intramuscular Q90 days Dove, Myra C, MD   11.25 mg at 03/01/18 0945  . leuprolide ([name]) injection 3.75 mg  3.75 mg Intramuscular Once Denney, Rachelle A, CNM       Facility-Administered Medications Ordered in Other Visits  Medication Dose Route Frequency Provider Last Rate Last Dose  . bupivacaine (MARCAINE) 0.5 % 15 mL, phenazopyridine (PYRIDIUM) 400 mg bladder mixture   Bladder Instillation Once Festus Aloe, MD         Musculoskeletal: Strength & Muscle Tone: within normal limits Gait & Station: normal Patient leans: N/A  Psychiatric Specialty Exam: ROS  There were no vitals  taken for this visit.There is no height or weight on file to calculate BMI.  General Appearance: Casual and Fairly Groomed  Eye Contact:  Fair  Speech:  Clear and Coherent and Normal Rate  Volume:  Normal  Mood:  Depressed, Dysphoric and Irritable  Affect:  Congruent  Thought Process:  Coherent, Goal Directed and Descriptions of Associations: Intact  Orientation:  Full (Time, Place, and Person)  Thought Content: Logical   Suicidal Thoughts:  No  Homicidal Thoughts:  No  Memory:  Recent;   Good  Judgement:  Poor  Insight:  Shallow  Psychomotor Activity:  Normal  Concentration:  Attention Span: Fair  Recall:  [employer] of Knowledge: Fair  Language: Fair  Akathisia:  Negative  Handed:  Right  AIMS (if indicated): not done  Assets:  Communication Skills Desire for Improvement  ADL's:  Intact  Cognition: WNL  Sleep:  Poor   Screenings: GAD-7     Office Visit from 04/01/2017 in [facility]  Total GAD-7 Score  6    PHQ2-9     Office Visit from 03/17/2018 in [facility] Office Visit from 02/09/2018 in [facility] Office Visit from 12/16/2017 in [facility] Office Visit from 11/25/2017 in [facility]2017 [facility]  PHQ-2 Total Score  1  0  0  0  0       Assessment and Plan:  Rhonda Hester presents for medication management follow-up after 6 months.  She self discontinued her medications, continues to participate quite poorly in her care.  Presents as irritable and frustrated that the Prozac is not helping, so she self discontinued the medicine approximately 2 months ago.  She has not been adherent with gabapentin either out  of frustration that this has not been helping her pain.  Spent time educating her on the dosing of gabapentin, and the benefit of Cymbalta for pain and depression.  We agreed to initiate as below, and I  said some expectations with the patient regarding participation in care and communication with her providers.  1. Moderate episode of recurrent major depressive disorder ([place])   2. Chronic pain syndrome   3. Chronic generalized pain     Status of current problems: unchanged  Labs Ordered: No orders of the defined types were placed in this encounter.   Labs Reviewed: n/a  Collateral Obtained/Records Reviewed: n/a  Plan:  Gabapentin 900 mg nightly Cymbalata 30 mg x 1 week, then increase to 60 mg If patient has a good response to Cymbalta, we can increase to 90 mg as tolerated  I spent 20 minutes with the patient in direct face-to-face clinical care.  Greater than 50% of this time was spent in counseling and coordination of care with the patient.    Aundra Dubin, MD 03/28/2018, 10:15 AM

## 2018-03-30 ENCOUNTER — Other Ambulatory Visit: Payer: Self-pay | Admitting: Family Medicine

## 2018-03-30 DIAGNOSIS — R590 Localized enlarged lymph nodes: Secondary | ICD-10-CM

## 2018-03-30 DIAGNOSIS — M26609 Unspecified temporomandibular joint disorder, unspecified side: Secondary | ICD-10-CM

## 2018-03-30 NOTE — Progress Notes (Signed)
Orders Placed This Encounter  Procedures  . Ambulatory referral to ENT    Referral Priority:   Routine    Referral Type:   Consultation    Referral Reason:   Specialty Services Required    Requested Specialty:   Otolaryngology    Number of Visits Requested:   [place]  MSN, FNP-C [facility] [address] [place], [place] [address] [phone]

## 2018-03-31 ENCOUNTER — Other Ambulatory Visit: Payer: Self-pay | Admitting: *Deleted

## 2018-03-31 ENCOUNTER — Encounter: Payer: Self-pay | Admitting: Adult Health

## 2018-03-31 ENCOUNTER — Ambulatory Visit (INDEPENDENT_AMBULATORY_CARE_PROVIDER_SITE_OTHER)
Admission: RE | Admit: 2018-03-31 | Discharge: 2018-03-31 | Disposition: A | Discharge status: Home / Self Care | Payer: Medicare Other | Source: Ambulatory Visit | Attending: Adult Health | Admitting: Adult Health

## 2018-03-31 ENCOUNTER — Ambulatory Visit (INDEPENDENT_AMBULATORY_CARE_PROVIDER_SITE_OTHER): Payer: Medicare Other | Admitting: Adult Health

## 2018-03-31 ENCOUNTER — Other Ambulatory Visit (INDEPENDENT_AMBULATORY_CARE_PROVIDER_SITE_OTHER): Payer: Medicare Other

## 2018-03-31 DIAGNOSIS — R7989 Other specified abnormal findings of blood chemistry: Secondary | ICD-10-CM

## 2018-03-31 DIAGNOSIS — D869 Sarcoidosis, unspecified: Secondary | ICD-10-CM | POA: Diagnosis not present

## 2018-03-31 DIAGNOSIS — D860 Sarcoidosis of lung: Secondary | ICD-10-CM

## 2018-03-31 DIAGNOSIS — R590 Localized enlarged lymph nodes: Secondary | ICD-10-CM

## 2018-03-31 DIAGNOSIS — R945 Abnormal results of liver function studies: Secondary | ICD-10-CM

## 2018-03-31 LAB — HEPATIC FUNCTION PANEL
ALBUMIN: 3.6 g/dL (ref 3.5–5.2)
ALK PHOS: 399 U/L — AB (ref 39–117)
ALT: 42 U/L — ABNORMAL HIGH (ref 0–35)
AST: 35 U/L (ref 0–37)
BILIRUBIN DIRECT: 0.4 mg/dL — AB (ref 0.0–0.3)
BILIRUBIN TOTAL: 1.2 mg/dL (ref 0.2–1.2)
Total Protein: 9.2 g/dL — ABNORMAL HIGH (ref 6.0–8.3)

## 2018-03-31 LAB — BASIC METABOLIC PANEL
BUN: 8 mg/dL (ref 6–23)
CHLORIDE: 99 meq/L (ref 96–112)
CO2: 27 mEq/L (ref 19–32)
CREATININE: 0.67 mg/dL (ref 0.40–1.20)
Calcium: 10 mg/dL (ref 8.4–10.5)
GFR: 124.53 mL/min (ref >60.00)
Glucose, Bld: 77 mg/dL (ref 70–99)
Potassium: 4.1 mEq/L (ref 3.5–5.1)
Sodium: 135 mEq/L (ref 135–145)

## 2018-03-31 LAB — BRAIN NATRIURETIC PEPTIDE: Pro B Natriuretic peptide (BNP): 10 pg/mL (ref 0.0–100.0)

## 2018-03-31 MED ORDER — HYDROCODONE-HOMATROPINE 5-1.5 MG/5ML PO SYRP: 5.0000 mL | ORAL_SOLUTION | Freq: Four times a day (QID) | ORAL | 0 refills | Status: DC | PRN

## 2018-03-31 MED ORDER — AMOXICILLIN-POT CLAVULANATE 875-125 MG PO TABS: 1.0000 | ORAL_TABLET | Freq: Two times a day (BID) | ORAL | 0 refills | Status: AC

## 2018-03-31 MED ORDER — PREDNISONE 10 MG PO TABS: ORAL_TABLET | ORAL | 1 refills | Status: DC

## 2018-03-31 NOTE — Progress Notes (Signed)
Reviewed and agree with assessment/plan.   Marcille Barman, MD [facility] 12/22/2016, 12:24 PM Pager:  [phone]  

## 2018-03-31 NOTE — Assessment & Plan Note (Signed)
?   Sarcoid related.  Steroid taper   Plan  Patient Instructions  Augmentin 875mg  Twice daily  For 7 days , take with food .  Prednisone 40mg  .daily for 4 days , then 30mg  daily for 4 days , then 20mg  daily and hold at this dose  Hydromet 1 tsp every 6hrs as needed for cough .  Labs today .  Chest xray today .  Warm heat to ribs .  Set up for a HRCT chest .  Follow up with Dr. Halford Chessman  In 6-8 weeks with PFT -Spirometry with DLCO .  Marland KitchenPlease contact office for sooner follow up if symptoms do not improve or worsen or seek emergency care

## 2018-03-31 NOTE — Progress Notes (Signed)
Was able to talk to patient regarding patient's results.  They verbalized an understanding of what was discussed. No further questions at this time. Order to [facility] [place] was put in. Patient was let known someone will call her back Monday to help set it up.

## 2018-03-31 NOTE — Patient Instructions (Addendum)
Augmentin 875mg  Twice daily  For 7 days , take with food .  Prednisone 40mg  .daily for 4 days , then 30mg  daily for 4 days , then 20mg  daily and hold at this dose  Hydromet 1 tsp every 6hrs as needed for cough .  Labs today .  Chest xray today .  Warm heat to ribs .  Set up for a HRCT chest .  Follow up with Dr. Halford Chessman  In 6-8 weeks with PFT -Spirometry with DLCO .  Marland KitchenPlease contact office for sooner follow up if symptoms do not improve or worsen or seek emergency care

## 2018-03-31 NOTE — Progress Notes (Signed)
@Patient  ID: Rhonda Hester, female    DOB: 02/10/1976, 42 y.o.   MRN: [id number]  Chief Complaint  Patient presents with  . Acute Visit    Sarcoid     Referring provider: Dorena Dew, FNP  HPI: 42 year old female former smoker followed for asthma, allergic rhinitis and sarcoidosis-diagnosed 2006 (pulmonary and skin involvement, Bell's palsy)  Pulmonary tests: RAST 07/22/11 >>Multiple allergens, IgE 71.8  PFT 09/02/11 >> FEV1 2.80(93%), FEV1% 82, TLC 4.74(89%), DLCO 59%, no BD PFT 09/25/13 >> FEV1 2.75 (95%), FEV1% 81, TLC 4.25 (77%), DLCO 64%, no BD ACE 09/24/16 >> 219 PFT 11/24/16 >> FEV1 2.52 (89%), FEV1% 80, TLC 4.65 (84%), DLCO 56%, no BD ACE 04/25/17 >> 233  CT chests CT chest 12/30/04 >>b/l hilar and mediastinal adenopathy with b/l nodular interstitial opacities  CT chest 07/08/08 >>decreased lymph nodes, scarring with volume loss  CT chest 11/20/13 >>Scattered areas of ATX CT chest 04/02/14 >> calcified Rt hilar LAN, scattered areas of scarring CT chest 10/01/16 >> stable scarring and traction BTX   03/31/2018 Acute OV : Sarcoid  Patient presents for an acute office visit.  Patient complains of persistent symptoms of cough congestion with thick green mucus swollen glands, joint pains, rash and rib pain. Patient says her glands along her neck are very sore and tender.  She underwent an ultrasound of her neck that showed multiple enlarged lymph nodes largest at 1.1 cm. She says her symptoms are getting worse.  She is now coughing up green mucus.  Cough is keeping her up at night.  She denies any fever, chest pain, orthopnea, hemoptysis edema.  Or calf pain. She is not taking any medications for treatment   Allergies  Allergen Reactions  . Acyclovir And Related     Patient states it "made me feel like I can't breath"   . Citric Acid Other (See Comments)    AVOIDS DUE TO INTERSTITIAL CYSTITIS  . Dust Mite Extract Itching and Other (See Comments)   coughing  . Grapeseed Extract [Nutritional Supplements]     Throat itching and burning  . Pineapple   . Pollen Extract Itching and Other (See Comments)     coughing  . Nickel Rash    Immunization History  Administered Date(s) Administered  . Influenza Whole 09/02/2011, 09/26/2012  . Influenza,inj,Quad PF,6+ Mos 12/27/2012, 09/04/2015, 09/20/2017  . Pneumococcal Polysaccharide-23 05/10/2016  . Tdap 05/10/2016    Past Medical History:  Diagnosis Date  . Allergic asthma   . Breast mass 05/2017   lt breast lump  . Eczema   . GERD (gastroesophageal reflux disease)   . History of anal fissures   . History of Bell's palsy    2006  RIGHT SIDE--  RESOLVED  . History of gastroesophageal reflux (GERD)   . Hyperlipidemia   . Interstitial cystitis   . Sarcoidosis of lung ([place])    DX 2006--  PULMOLOGIST--  DR BVQX  . Seasonal allergic rhinitis   . Shortness of breath     Tobacco History: Social History   Tobacco Use  Smoking Status Former Smoker  . Packs/day: 0.30  . Years: 6.00  . Pack years: 1.80  . Types: Cigarettes, Cigars  . Last attempt to quit: 11/2016  . Years since quitting: 1.3  Smokeless Tobacco Never Used  Tobacco Comment   ONE CIG. DAILY /  1 PPMONTH   Counseling given: Not Answered Comment: ONE CIG. DAILY /  1 PPMONTH   Outpatient Encounter Medications as of 03/31/2018  Medication Sig  . albuterol (PROAIR HFA) 108 (90 BASE) MCG/ACT inhaler Inhale 2 puffs into the lungs every 6 (six) hours as needed for shortness of breath.  . cetirizine (ZYRTEC) 10 MG tablet Take 1 tablet (10 mg total) by mouth daily.  . cyclobenzaprine (FLEXERIL) 5 MG tablet Take 1 tablet (5 mg total) by mouth at bedtime.  . DULoxetine (CYMBALTA) 30 MG capsule Take 1 capsule (30 mg total) by mouth daily for 7 days, THEN 1 capsule (30 mg total) 2 (two) times daily.  Marland Kitchen gabapentin (NEURONTIN) 300 MG capsule Take 3 capsules (900 mg total) by mouth at bedtime.  . LUTEIN PO Take 1 tablet by  mouth daily.  . mometasone (NASONEX) 50 MCG/ACT nasal spray Place 2 sprays into the nose daily.  . mometasone-formoterol (DULERA) 100-5 MCG/ACT AERO Inhale 2 puffs into the lungs 2 (two) times daily.  . predniSONE (DELTASONE) 20 MG tablet Take 20 mg by mouth daily with breakfast.  . sodium chloride (OCEAN) 0.65 % SOLN nasal spray Place 1 spray into both nostrils as needed for congestion.  Marland Kitchen amoxicillin-clavulanate (AUGMENTIN) 875-125 MG tablet Take 1 tablet by mouth 2 (two) times daily for 7 days.  Marland Kitchen HYDROcodone-homatropine (HYDROMET) 5-1.5 MG/5ML syrup Take 5 mLs by mouth every 6 (six) hours as needed for cough.  . predniSONE (DELTASONE) 10 MG tablet 4 tabs for 4  days, then 3 tabs for 4 days, 2 tabs  Daily and hold at this dose   Facility-Administered Encounter Medications as of 03/31/2018  Medication  . bupivacaine (MARCAINE) 0.5 % 15 mL, phenazopyridine (PYRIDIUM) 400 mg bladder mixture  . leuprolide ([name]) injection 11.25 mg  . leuprolide ([name]) injection 3.75 mg     Review of Systems  Constitutional:   No  weight loss, night sweats,  Fevers, chills, '+fatigue, or  lassitude.  HEENT:   No headaches,  Difficulty swallowing,  Tooth/dental problems, or  Sore throat,                No sneezing, itching, ear ache, nasal congestion, post nasal drip,   CV:  No chest pain,  Orthopnea, PND, swelling in lower extremities, anasarca, dizziness, palpitations, syncope.   GI  No heartburn, indigestion, abdominal pain, nausea, vomiting, diarrhea, change in bowel habits, loss of appetite, bloody stools.   Resp: .  No chest wall deformity  Skin: no rash or lesions.  GU: no dysuria, change in color of urine, no urgency or frequency.  No flank pain, no hematuria   MS:  No joint pain or swelling.  No decreased range of motion.  No back pain.    Physical Exam  BP 122/80 (BP Location: Right Arm, Cuff Size: Normal)   Pulse 84   Ht 5\' 6"  (1.676 m)   Wt 201 lb 3.2 oz (91.3 kg)   SpO2 97%    BMI 32.47 kg/m   GEN: A/Ox3; pleasant , NAD, obese    HEENT:  [place]/AT,  EACs-clear, TMs-wnl, NOSE-clear, THROAT-clear, no lesions, no postnasal drip or exudate noted.   NECK:  Supple w/ fair ROM; no JVD; normal carotid impulses w/o bruits; no thyromegaly or nodules palpated; tender and palpable cervical adenopathy   RESP  Clear  P & A; w/o, wheezes/ rales/ or rhonchi. no accessory muscle use, no dullness to percussion  CARD:  RRR, no m/r/g, no peripheral edema, pulses intact, no cyanosis or clubbing.  GI:   Soft & nt; nml bowel sounds; no organomegaly or masses detected.   Musco: Warm  bil, no deformities or joint swelling noted.   Neuro: alert, no focal deficits noted.    Skin: Warm, no lesions or rashes    Lab Results:  CBC  BMET   BNP No results found for: BNP   Imaging:    Assessment & Plan:   Sarcoidosis of lung Flare with URI/Bronchitis  cxr today  Check labs with ACE and LFT  Needs follow up HRCT chest and PFT . May need to consider steroid sparing med if not able to stay off steroids  Plan  Patient Instructions  Augmentin 875mg  Twice daily  For 7 days , take with food .  Prednisone 40mg  .daily for 4 days , then 30mg  daily for 4 days , then 20mg  daily and hold at this dose  Hydromet 1 tsp every 6hrs as needed for cough .  Labs today .  Chest xray today .  Warm heat to ribs .  Set up for a HRCT chest .  Follow up with Dr. Halford Chessman  In 6-8 weeks with PFT -Spirometry with DLCO .  Marland KitchenPlease contact office for sooner follow up if symptoms do not improve or worsen or seek emergency care       Cervical adenopathy ? Sarcoid related.  Steroid taper   Plan  Patient Instructions  Augmentin 875mg  Twice daily  For 7 days , take with food .  Prednisone 40mg  .daily for 4 days , then 30mg  daily for 4 days , then 20mg  daily and hold at this dose  Hydromet 1 tsp every 6hrs as needed for cough .  Labs today .  Chest xray today .  Warm heat to ribs .  Set up for a  HRCT chest .  Follow up with Dr. Halford Chessman  In 6-8 weeks with PFT -Spirometry with DLCO .  Marland KitchenPlease contact office for sooner follow up if symptoms do not improve or worsen or seek emergency care          Rexene Edison, NP 03/31/2018

## 2018-03-31 NOTE — Progress Notes (Signed)
Was able to talk to patent regarding results results.  They verbalized an understanding of what was discussed. No further questions at this time.

## 2018-03-31 NOTE — Assessment & Plan Note (Signed)
Flare with URI/Bronchitis  cxr today  Check labs with ACE and LFT  Needs follow up HRCT chest and PFT . May need to consider steroid sparing med if not able to stay off steroids  Plan  Patient Instructions  Augmentin 875mg  Twice daily  For 7 days , take with food .  Prednisone 40mg  .daily for 4 days , then 30mg  daily for 4 days , then 20mg  daily and hold at this dose  Hydromet 1 tsp every 6hrs as needed for cough .  Labs today .  Chest xray today .  Warm heat to ribs .  Set up for a HRCT chest .  Follow up with Dr. Halford Chessman  In 6-8 weeks with PFT -Spirometry with DLCO .  Marland KitchenPlease contact office for sooner follow up if symptoms do not improve or worsen or seek emergency care

## 2018-04-01 LAB — ANGIOTENSIN CONVERTING ENZYME: Angiotensin-Converting Enzyme: 512 U/L — ABNORMAL HIGH (ref 9–67)

## 2018-04-05 ENCOUNTER — Ambulatory Visit (HOSPITAL_COMMUNITY)
Admission: RE | Admit: 2018-04-05 | Discharge: 2018-04-05 | Disposition: A | Discharge status: Home / Self Care | Payer: Medicare Other | Source: Ambulatory Visit | Attending: Adult Health | Admitting: Adult Health

## 2018-04-05 ENCOUNTER — Telehealth: Payer: Self-pay | Admitting: Adult Health

## 2018-04-05 DIAGNOSIS — R161 Splenomegaly, not elsewhere classified: Secondary | ICD-10-CM | POA: Insufficient documentation

## 2018-04-05 DIAGNOSIS — R945 Abnormal results of liver function studies: Secondary | ICD-10-CM | POA: Diagnosis present

## 2018-04-05 DIAGNOSIS — K760 Fatty (change of) liver, not elsewhere classified: Secondary | ICD-10-CM | POA: Insufficient documentation

## 2018-04-05 DIAGNOSIS — R7989 Other specified abnormal findings of blood chemistry: Secondary | ICD-10-CM

## 2018-04-05 DIAGNOSIS — D860 Sarcoidosis of lung: Secondary | ICD-10-CM | POA: Insufficient documentation

## 2018-04-05 NOTE — Telephone Encounter (Signed)
Called and spoke with patient she is aware of results and verbalized understanding nothing further needed.

## 2018-04-07 ENCOUNTER — Ambulatory Visit (INDEPENDENT_AMBULATORY_CARE_PROVIDER_SITE_OTHER)
Admission: RE | Admit: 2018-04-07 | Discharge: 2018-04-07 | Disposition: A | Discharge status: Home / Self Care | Payer: Medicare Other | Source: Ambulatory Visit | Attending: Adult Health | Admitting: Adult Health

## 2018-04-07 DIAGNOSIS — D869 Sarcoidosis, unspecified: Secondary | ICD-10-CM | POA: Diagnosis not present

## 2018-04-10 ENCOUNTER — Encounter: Payer: Self-pay | Admitting: Physician Assistant

## 2018-04-13 ENCOUNTER — Other Ambulatory Visit: Payer: Self-pay

## 2018-04-13 DIAGNOSIS — D869 Sarcoidosis, unspecified: Secondary | ICD-10-CM

## 2018-04-13 NOTE — Progress Notes (Signed)
Order placed for GI referral.   

## 2018-04-25 ENCOUNTER — Ambulatory Visit (INDEPENDENT_AMBULATORY_CARE_PROVIDER_SITE_OTHER): Payer: Medicare Other | Admitting: Physician Assistant

## 2018-04-25 ENCOUNTER — Encounter: Payer: Self-pay | Admitting: Physician Assistant

## 2018-04-25 ENCOUNTER — Other Ambulatory Visit (INDEPENDENT_AMBULATORY_CARE_PROVIDER_SITE_OTHER): Payer: Medicare Other

## 2018-04-25 VITALS — BP 110/70 | HR 72 | Ht 67.5 in | Wt 206.0 lb

## 2018-04-25 DIAGNOSIS — R945 Abnormal results of liver function studies: Secondary | ICD-10-CM | POA: Diagnosis not present

## 2018-04-25 DIAGNOSIS — R161 Splenomegaly, not elsewhere classified: Secondary | ICD-10-CM | POA: Diagnosis not present

## 2018-04-25 DIAGNOSIS — K760 Fatty (change of) liver, not elsewhere classified: Secondary | ICD-10-CM

## 2018-04-25 DIAGNOSIS — D860 Sarcoidosis of lung: Secondary | ICD-10-CM

## 2018-04-25 DIAGNOSIS — R7989 Other specified abnormal findings of blood chemistry: Secondary | ICD-10-CM

## 2018-04-25 DIAGNOSIS — K921 Melena: Secondary | ICD-10-CM | POA: Diagnosis not present

## 2018-04-25 DIAGNOSIS — K648 Other hemorrhoids: Secondary | ICD-10-CM

## 2018-04-25 LAB — COMPREHENSIVE METABOLIC PANEL
ALBUMIN: 3.7 g/dL (ref 3.5–5.2)
ALT: 50 U/L — ABNORMAL HIGH (ref 0–35)
AST: 33 U/L (ref 0–37)
Alkaline Phosphatase: 193 U/L — ABNORMAL HIGH (ref 39–117)
BILIRUBIN TOTAL: 1.2 mg/dL (ref 0.2–1.2)
BUN: 11 mg/dL (ref 6–23)
CALCIUM: 9.5 mg/dL (ref 8.4–10.5)
CHLORIDE: 100 meq/L (ref 96–112)
CO2: 28 mEq/L (ref 19–32)
Creatinine, Ser: 0.61 mg/dL (ref 0.40–1.20)
GFR: 138.73 mL/min (ref >60.00)
Glucose, Bld: 92 mg/dL (ref 70–99)
Potassium: 3.8 mEq/L (ref 3.5–5.1)
Sodium: 137 mEq/L (ref 135–145)
Total Protein: 8.2 g/dL (ref 6.0–8.3)

## 2018-04-25 LAB — IBC PANEL
IRON: 56 ug/dL (ref 42–145)
Saturation Ratios: 10.8 % — ABNORMAL LOW (ref 20.0–50.0)
Transferrin: 372 mg/dL — ABNORMAL HIGH (ref 212.0–360.0)

## 2018-04-25 LAB — FERRITIN: Ferritin: 41.7 ng/mL (ref 10.0–291.0)

## 2018-04-25 LAB — IGA: IGA: 578 mg/dL — AB (ref 68–378)

## 2018-04-25 MED ORDER — TRAMADOL HCL 50 MG PO TABS: 50.0000 mg | ORAL_TABLET | Freq: Four times a day (QID) | ORAL | 0 refills | Status: DC | PRN

## 2018-04-25 MED ORDER — HYDROCORTISONE 2.5 % RE CREA: 1.0000 "application " | TOPICAL_CREAM | Freq: Two times a day (BID) | RECTAL | 1 refills | Status: DC

## 2018-04-25 NOTE — Progress Notes (Signed)
Chief Complaint: Fatty liver, elevated LFTs  HPI:    Rhonda Hester is a 42 year old female with a past medical history as listed below, including sarcoidosis, who was referred to me by Dorena Dew, FNP for a complaint of elevated LFTs and fatty liver.      04/05/2018 abdominal ultrasound with fatty infiltration of the liver and splenomegaly.  03/31/2018 LFTs with a direct bilirubin minimally increased to 8.4, alk phos increased at 399 (144 8 months ago), AST normal 35, ALT elevated 42, others normal.  BMP normal.  Angiotensin-converting enzyme elevated at 512.    Today, presents to clinic and describes bright red blood with her bowel movements twice a day over the past 3 weeks, describes some maroon coloration with occasional "clots".  Also describes a sharp pain in her back over the past 4 days.  Notes variance in stool between loose and solid.  Has been on Prednisone 20 mg daily for sarcoidosis to help with her breathing.  Associated symptoms including "pain all over".  Relates this to her sarcoidosis.    Also aware of elevated LFTs and recent finding of splenomegaly/fatty liver on imaging.  Does have questions regarding this.  Tells me she has had splenomegaly in the past on 3 separate occasions and one time "they told me they wanted to take it out".    Denies fever, chills, weight loss, anorexia, nausea or vomiting.  Past Medical History:  Diagnosis Date  . Allergic asthma   . Breast mass 05/2017   lt breast lump  . Eczema   . GERD (gastroesophageal reflux disease)   . History of anal fissures   . History of Bell's palsy    2006  RIGHT SIDE--  RESOLVED  . History of gastroesophageal reflux (GERD)   . Hyperlipidemia   . Interstitial cystitis   . Sarcoidosis of lung ([place])    DX 2006--  PULMOLOGIST--  DR XTAV  . Seasonal allergic rhinitis   . Shortness of breath     Past Surgical History:  Procedure Laterality Date  . CYSTO WITH HYDRODISTENSION N/A 08/24/2013   Procedure:  CYSTOSCOPY/HYDRODISTENSION with instillation of marcaine and pyridium;  Surgeon: Fredricka Bonine, MD;  Location: [facility];  Service: Urology;  Laterality: N/A;  . CYSTOSCOPY/ HYDRODISTENTION/ BLADDER BX  06-09-2010  . DILATION AND CURETTAGE OF UTERUS  1997    Current Outpatient Medications  Medication Sig Dispense Refill  . albuterol (PROAIR HFA) 108 (90 BASE) MCG/ACT inhaler Inhale 2 puffs into the lungs every 6 (six) hours as needed for shortness of breath. 1 Inhaler 5  . cetirizine (ZYRTEC) 10 MG tablet Take 1 tablet (10 mg total) by mouth daily. 30 tablet 11  . DULoxetine (CYMBALTA) 30 MG capsule Take 1 capsule (30 mg total) by mouth daily for 7 days, THEN 1 capsule (30 mg total) 2 (two) times daily. 180 capsule 0  . gabapentin (NEURONTIN) 300 MG capsule Take 3 capsules (900 mg total) by mouth at bedtime. 90 capsule 1  . LUTEIN PO Take 1 tablet by mouth daily.    . predniSONE (DELTASONE) 20 MG tablet Take 20 mg by mouth daily with breakfast.     Current Facility-Administered Medications  Medication Dose Route Frequency Provider Last Rate Last Dose  . leuprolide ([name]) injection 11.25 mg  11.25 mg Intramuscular Q90 days Dove, Myra C, MD   11.25 mg at 03/01/18 0945  . leuprolide ([name]) injection 3.75 mg  3.75 mg Intramuscular Once Denney, Rachelle A, CNM  Facility-Administered Medications Ordered in Other Visits  Medication Dose Route Frequency Provider Last Rate Last Dose  . bupivacaine (MARCAINE) 0.5 % 15 mL, phenazopyridine (PYRIDIUM) 400 mg bladder mixture   Bladder Instillation Once Festus Aloe, MD        Allergies as of 04/25/2018 - Review Complete 04/25/2018  Allergen Reaction Noted  . Acyclovir and related  03/18/2017  . Citric acid Other (See Comments)   . Dust mite extract Itching and Other (See Comments) 11/01/2012  . Grapeseed extract [nutritional supplements]  03/18/2017  . Pineapple  05/09/2017  . Pollen extract Itching and Other  (See Comments) 11/01/2012  . Nickel Rash 06/20/2013    Family History  Problem Relation Age of Onset  . Brain cancer Maternal Grandmother   . Hyperlipidemia Other   . Sleep apnea Other   . Depression Maternal Aunt   . Seizures Maternal Uncle     Social History   Socioeconomic History  . Marital status: Single    Spouse name: Not on file  . Number of children: 0  . Years of education: Not on file  . Highest education level: Not on file  Occupational History  . Occupation: disabled  Social Needs  . Financial resource strain: Not on file  . Food insecurity:    Worry: Not on file    Inability: Not on file  . Transportation needs:    Medical: Not on file    Non-medical: Not on file  Tobacco Use  . Smoking status: Former Smoker    Packs/day: 0.30    Years: 6.00    Pack years: 1.80    Types: Cigarettes, Cigars    Last attempt to quit: 11/2016    Years since quitting: 1.4  . Smokeless tobacco: Never Used  . Tobacco comment: ONE CIG. DAILY /  1 PPMONTH  Substance and Sexual Activity  . Alcohol use: No    Alcohol/week: 0.0 oz  . Drug use: No    Comment: HX MARIJUANA USE  . Sexual activity: Never    Birth control/protection: None    Comment: abstinence   Lifestyle  . Physical activity:    Days per week: Not on file    Minutes per session: Not on file  . Stress: Not on file  Relationships  . Social connections:    Talks on phone: Not on file    Gets together: Not on file    Attends religious service: Not on file    Active member of club or organization: Not on file    Attends meetings of clubs or organizations: Not on file    Relationship status: Not on file  . Intimate partner violence:    Fear of current or ex partner: Not on file    Emotionally abused: Not on file    Physically abused: Not on file    Forced sexual activity: Not on file  Other Topics Concern  . Not on file  Social History Narrative  . Not on file    Review of Systems:    Constitutional:  No weight loss, fever or chills +night sweats Skin: +rash Cardiovascular: No chest pain or palpitations Respiratory: +SOB, cough Gastrointestinal: See HPI and otherwise negative Genitourinary: No dysuria  Neurological: No headache Musculoskeletal: +muscle cramps and back pain Hematologic: +hematochezia Psychiatric: +anxiety, depression   Physical Exam:  Vital signs: BP 110/70   Pulse 72   Ht 5' 7.5" (1.715 m) Comment: without shoes  Wt 206 lb (93.4 kg)   BMI 31.79  kg/m   Constitutional:   Pleasant AA female appears to be in NAD, Well developed, Well nourished, alert and cooperative Head:  Normocephalic and atraumatic. Eyes:   PEERL, EOMI. No icterus. Conjunctiva pink. Ears:  Normal auditory acuity. Neck:  Supple Throat: Oral cavity and pharynx without inflammation, swelling or lesion.  Respiratory: Respirations even and unlabored. Lungs clear to auscultation bilaterally.   No wheezes, crackles, or rhonchi.  Cardiovascular: Normal S1, S2. No MRG. Regular rate and rhythm. No peripheral edema, cyanosis or pallor.  Gastrointestinal:  Soft, nondistended, nontender. No rebound or guarding. Normal bowel sounds. No appreciable masses or hepatomegaly. Rectal:  External: 2 hemorrhoid tags, no ttp; Internal: increased sphincter tone; Anoscopy: two columns of inflamed Grade I hemorrhoids with stigmata of recent bleeding Msk:  Symmetrical without gross deformities. Without edema, no deformity or joint abnormality.  Neurologic:  Alert and  oriented x4;  grossly normal neurologically.  Skin:   Dry and intact without significant lesions or rashes. Psychiatric: Demonstrates good judgement and reason without abnormal affect or behaviors.  MOST RECENT LABS AND IMAGING: CBC    Component Value Date/Time   WBC 8.2 02/09/2018 1122   WBC 5.0 08/09/2017 1018   RBC 4.14 02/09/2018 1122   RBC 4.48 08/09/2017 1018   HGB 10.1 (L) 02/09/2018 1122   HCT 33.7 (L) 02/09/2018 1122   PLT 408 (H) 02/09/2018  1122   MCV 81 02/09/2018 1122   MCH 24.4 (L) 02/09/2018 1122   MCH 25.2 (L) 08/09/2017 1018   MCHC 30.0 (L) 02/09/2018 1122   MCHC 32.0 08/09/2017 1018   RDW 17.8 (H) 02/09/2018 1122   LYMPHSABS 1,350 08/09/2017 1018   MONOABS 500 08/09/2017 1018   EOSABS 250 08/09/2017 1018   BASOSABS 50 08/09/2017 1018    CMP     Component Value Date/Time   NA 135 03/31/2018 1229   K 4.1 03/31/2018 1229   CL 99 03/31/2018 1229   CO2 27 03/31/2018 1229   GLUCOSE 77 03/31/2018 1229   BUN 8 03/31/2018 1229   CREATININE 0.67 03/31/2018 1229   CREATININE 0.65 08/09/2017 1018   CALCIUM 10.0 03/31/2018 1229   PROT 9.2 (H) 03/31/2018 1229   ALBUMIN 3.6 03/31/2018 1229   AST 35 03/31/2018 1229   ALT 42 (H) 03/31/2018 1229   ALKPHOS 399 (H) 03/31/2018 1229   BILITOT 1.2 03/31/2018 1229   GFRNONAA >89 08/09/2017 1018   GFRAA >89 08/09/2017 1018    Assessment: 1.  Internal hemorrhoids: 2 columns of inflamed hemorrhoids with stigmata of bleeding on anoscopy today 2.  Hematochezia: For the past 3 weeks with bowel movements and sometimes without, internal hemorrhoids with stigmata of bleeding at time of endoscopy today 3.  Elevated LFTs: AST 42, alk phos 399, AST normal, fatty liver on ultrasound; consider relation to fatty liver +/-  sarcoidosis 4.  Fatty liver: Seen on recent abdominal ultrasound 5.  Splenomegaly: Seen at time of abdominal ultrasound recently, also on CT abdomen pelvis 06/22/2016 when they discussed multiple lobulated low density lesions throughout the spleen predominantly replacing the parenchyma and consistent with progressive sarcoidosis 6.  Sarcoidosis: Recent flare of symptoms per PCP, increased shortness of breath, currently on Prednisone 20 mg daily  Plan: 1.  Prescribed Hydrocortisone ointment to be applied to glycerin suppositories twice daily x7 days with 1 refill. 2.  Recommend sitz baths for 15 to 20 minutes 3 times a day 3.  Ordered further liver serologies including  Alpha 1 Antitrypsin, ANA, ASMA, AMA, Ceruloplasmin, Iron  studies, Hepatitis panel 4.  Discussed fatty liver.  Recommendations are for slow and steady weight loss of 1 to 2 pounds per week. 5.  Briefly discussed splenomegaly, currently patient is having a lot of pain over her left upper quadrant.  Prescribed Tramadol 50 mg every 6 hours as needed #30 with no refills.  If continues with pain in this area recommend she see her PCP regarding refill of pain medicine. 6.  Patient to follow in clinic with me or Dr. Silverio Decamp in 3-4 weeks or sooner as guided by labs above  Ellouise Newer, PA-C [facility] 04/25/2018, 10:26 AM  Cc: Dorena Dew, FNP

## 2018-04-25 NOTE — Patient Instructions (Signed)
Your provider suggests that you use Hydrocortisone ointment to treat your symptoms. This can be applied to a glycerin suppository which can be purchased over the counter. Apply the suppository into the rectum twice daily for the next 7- 14 days.   Your provider has requested that you go to the basement level for lab work before leaving today. Press "B" on the elevator. The lab is located at the first door on the left as you exit the elevator.  Please see the information below about sitz baths. This can be used to alleviate pain. Try daily for 15-20 minutes a day.

## 2018-04-26 LAB — ALPHA-1-ANTITRYPSIN: A1 ANTITRYPSIN SER: 176 mg/dL (ref 83–199)

## 2018-04-26 LAB — HEPATITIS B SURFACE ANTIBODY,QUALITATIVE: HEP B S AB: REACTIVE — AB

## 2018-04-26 LAB — TISSUE TRANSGLUTAMINASE, IGA: (tTG) Ab, IgA: 1 U/mL

## 2018-04-26 LAB — HEPATITIS C ANTIBODY
Hepatitis C Ab: NONREACTIVE
SIGNAL TO CUT-OFF: 0.05 (ref <1.00)

## 2018-04-26 LAB — MITOCHONDRIAL ANTIBODIES

## 2018-04-26 LAB — HEPATITIS A ANTIBODY, TOTAL: Hepatitis A AB,Total: NONREACTIVE

## 2018-04-27 LAB — ANA: Anti Nuclear Antibody(ANA): NEGATIVE

## 2018-04-27 LAB — ANTI-SMOOTH MUSCLE ANTIBODY, IGG: ACTIN (SMOOTH MUSCLE) ANTIBODY (IGG): 26 U — AB (ref <20)

## 2018-04-27 LAB — HEPATITIS B SURFACE ANTIGEN: HEP B S AG: NONREACTIVE

## 2018-04-28 NOTE — Progress Notes (Signed)
Reviewed and agree with documentation and assessment and plan. K. Veena Vaniya Augspurger , MD   

## 2018-05-07 ENCOUNTER — Encounter (HOSPITAL_COMMUNITY): Payer: Self-pay | Admitting: Emergency Medicine

## 2018-05-07 ENCOUNTER — Ambulatory Visit (HOSPITAL_COMMUNITY)
Admission: EM | Admit: 2018-05-07 | Discharge: 2018-05-07 | Disposition: A | Discharge status: Home / Self Care | Payer: Medicare Other | Attending: Internal Medicine | Admitting: Internal Medicine

## 2018-05-07 DIAGNOSIS — G8929 Other chronic pain: Secondary | ICD-10-CM

## 2018-05-07 DIAGNOSIS — M545 Low back pain, unspecified: Secondary | ICD-10-CM

## 2018-05-07 DIAGNOSIS — D649 Anemia, unspecified: Secondary | ICD-10-CM | POA: Diagnosis not present

## 2018-05-07 DIAGNOSIS — R42 Dizziness and giddiness: Secondary | ICD-10-CM | POA: Diagnosis not present

## 2018-05-07 DIAGNOSIS — R350 Frequency of micturition: Secondary | ICD-10-CM

## 2018-05-07 LAB — POCT I-STAT, CHEM 8
BUN: 8 mg/dL (ref 6–20)
CREATININE: 0.7 mg/dL (ref 0.44–1.00)
Calcium, Ion: 1.2 mmol/L (ref 1.15–1.40)
Chloride: 102 mmol/L (ref 101–111)
GLUCOSE: 118 mg/dL — AB (ref 65–99)
HCT: 35 % — ABNORMAL LOW (ref 36.0–46.0)
HEMOGLOBIN: 11.9 g/dL — AB (ref 12.0–15.0)
POTASSIUM: 3.6 mmol/L (ref 3.5–5.1)
Sodium: 140 mmol/L (ref 135–145)
TCO2: 26 mmol/L (ref 22–32)

## 2018-05-07 LAB — POCT URINALYSIS DIP (DEVICE)
Bilirubin Urine: NEGATIVE
Glucose, UA: NEGATIVE mg/dL
KETONES UR: NEGATIVE mg/dL
Leukocytes, UA: NEGATIVE
Nitrite: NEGATIVE
PH: 6 (ref 5.0–8.0)
PROTEIN: 30 mg/dL — AB
Urobilinogen, UA: 2 mg/dL — ABNORMAL HIGH (ref 0.0–1.0)

## 2018-05-07 MED ORDER — CYCLOBENZAPRINE HCL 5 MG PO TABS: 5.0000 mg | ORAL_TABLET | Freq: Every evening | ORAL | 0 refills | Status: DC | PRN

## 2018-05-07 NOTE — Discharge Instructions (Addendum)
Continue conservative management of rest, ice, and gentle stretches Take cyclobenzaprine at nighttime for symptomatic relief. Avoid driving or operating heavy machinery while using medication. Referral to pain management for further evaluation and management Follow up with PCP if lightheadedness symptoms persists Present to ER if worsening or new symptoms (fever, chills, chest pain, abdominal pain, changes in bowel or bladder habits, pain radiating into lower legs, etc...)

## 2018-05-07 NOTE — ED Triage Notes (Signed)
Pt here with multiple complaints c/o nausea, feeling lightheaded, dark stool, lower abd pain, back pain, and right foot pain

## 2018-05-07 NOTE — ED Provider Notes (Signed)
[place]   [id number] 05/07/18 Arrival Time: 1025  SUBJECTIVE: History from: patient. Rhonda Hester is a 42 y.o. female with chronic low back pain complains of gradual back pain that began 1 week ago.  Denies a precipitating event or specific injury.  Localizes the pain to the lower mid back.  Describes the pain as constant and throbbing in nature.  Has NOT tried OTC medications without relief.  Symptoms are made worse with leaning forward and sitting for extended periods of time. Complains of increased urinary frequency.  Denies similar symptoms in the past.  Denies fever, chills, erythema, ecchymosis, effusion, weakness, numbness and tingling.      Patient also complains of worsening light-headedness that gradually came on 1 week ago.  She denies a precipitating event, or head injury.  She describes episodes as intermittent and last approximately 20 minutes.  Symptoms made worse with quick movements.  Denies similar hx in the past.  Complains of SOB, and nausea.  Denies chest pain, dizziness.    Also increased urinary frequency for 1 week.  Denies precipitating event.    Patient has a hx of hemorrhoids and is being treated by GI.    Hx of sarcoidosis. On prednisone.  ROS: As per HPI.  Past Medical History:  Diagnosis Date  . Allergic asthma   . Breast mass 05/2017   lt breast lump  . Eczema   . GERD (gastroesophageal reflux disease)   . History of anal fissures   . History of Bell's palsy    2006  RIGHT SIDE--  RESOLVED  . History of gastroesophageal reflux (GERD)   . Hyperlipidemia   . Interstitial cystitis   . Sarcoidosis of lung ([place])    DX 2006--  PULMOLOGIST--  DR ENID  . Seasonal allergic rhinitis   . Shortness of breath    Past Surgical History:  Procedure Laterality Date  . CYSTO WITH HYDRODISTENSION N/A 08/24/2013   Procedure: CYSTOSCOPY/HYDRODISTENSION with instillation of marcaine and pyridium;  Surgeon: Fredricka Bonine, MD;  Location:  [facility];  Service: Urology;  Laterality: N/A;  . CYSTOSCOPY/ HYDRODISTENTION/ BLADDER BX  06-09-2010  . DILATION AND CURETTAGE OF UTERUS  1997   Allergies  Allergen Reactions  . Acyclovir And Related     Patient states it "made me feel like I can't breath"   . Citric Acid Other (See Comments)    AVOIDS DUE TO INTERSTITIAL CYSTITIS  . Dust Mite Extract Itching and Other (See Comments)     coughing  . Grapeseed Extract [Nutritional Supplements]     Throat itching and burning  . Pineapple   . Pollen Extract Itching and Other (See Comments)     coughing  . Nickel Rash   Outpatient Medications   albuterol (PROAIR HFA) 108 (90 BASE) MCG/ACT inhaler    cetirizine (ZYRTEC) 10 MG tablet    DULoxetine (CYMBALTA) 30 MG capsule    gabapentin (NEURONTIN) 300 MG capsule    hydrocortisone (ANUSOL-HC) 2.5 % rectal cream    LUTEIN PO    predniSONE (DELTASONE) 20 MG tablet    traMADol (ULTRAM) 50 MG tablet     Social History   Socioeconomic History  . Marital status: Single    Spouse name: Not on file  . Number of children: 0  . Years of education: Not on file  . Highest education level: Not on file  Occupational History  . Occupation: disabled  Social Needs  . Financial resource strain: Not on file  .  Food insecurity:    Worry: Not on file    Inability: Not on file  . Transportation needs:    Medical: Not on file    Non-medical: Not on file  Tobacco Use  . Smoking status: Former Smoker    Packs/day: 0.30    Years: 6.00    Pack years: 1.80    Types: Cigarettes, Cigars    Last attempt to quit: 11/2016    Years since quitting: 1.4  . Smokeless tobacco: Never Used  . Tobacco comment: ONE CIG. DAILY /  1 PPMONTH  Substance and Sexual Activity  . Alcohol use: No    Alcohol/week: 0.0 oz  . Drug use: No    Comment: HX MARIJUANA USE  . Sexual activity: Never    Birth control/protection: None    Comment: abstinence   Lifestyle  . Physical activity:    Days  per week: Not on file    Minutes per session: Not on file  . Stress: Not on file  Relationships  . Social connections:    Talks on phone: Not on file    Gets together: Not on file    Attends religious service: Not on file    Active member of club or organization: Not on file    Attends meetings of clubs or organizations: Not on file    Relationship status: Not on file  . Intimate partner violence:    Fear of current or ex partner: Not on file    Emotionally abused: Not on file    Physically abused: Not on file    Forced sexual activity: Not on file  Other Topics Concern  . Not on file  Social History Narrative  . Not on file   Family History  Problem Relation Age of Onset  . Brain cancer Maternal Grandmother   . Hyperlipidemia Other   . Sleep apnea Other   . Depression Maternal Aunt   . Seizures Maternal Uncle     OBJECTIVE:  Vitals:   05/07/18 1433  BP: 127/84  Pulse: 89  Resp: 18  Temp: 98.9 F (37.2 C)  TempSrc: Oral  SpO2: 98%    General appearance: AOx3; appears uncomfortable Head: NCAT Lungs: CTA bilaterally Heart: RRR.  Clear S1 and S2 without murmur, gallops, or rubs.  Radial pulses 2+ bilaterally. Abdomen: Normal active BS, nontender to palpation Musculoskeletal: Back  Inspection: Skin warm, dry, clear and intact without obvious erythema, effusion, or ecchymosis.  Palpation: Tender over lower vertebral processes.   ROM: FROM active and passive Strength: 5/5 shld abduction, 5/5 shld adduction, 5/5 elbow flexion, 5/5 elbow extension, 5/5 grip strength, 5/5 hip flexion, 5/5 knee abduction, 5/5 knee adduction, 5/5 knee flexion, 5/5 knee extension, 5/5 dorsiflexion, 5/5 plantar flexion Skin: warm and dry Neurologic: Ambulates without difficulty; CN 2-12 grossly intact, Sensation and strength intact about the upper/ lower extremities; Finger to nose and RAM without difficulty Psychological: alert and cooperative; anxious, but improved towards end of  encounter  LABS: I-STAT, chem 8 05/12 1506 Sodium 140  Potassium 3.6  Chloride 102  BUN 8  Creatinine 0.70  Glucose 118  Calcium Ionized 1.20  TCO2 26  Hemoglobin 11.9  HCT    POCT urinalysis dip (device) 05/12 1538 Glucose NEGATIVE  Bilirubin Urine NEGATIVE  Ketones, ur NEGATIVE  Specific Gravity, Urine >=1.030  Hgb urine dipstick TRACE  pH 6.0  Protein 30  Urobilinogen, UA 2.0  Nitrite NEGATIVE  Leukocytes, UA NEGATIVE   EKG: 88 bpm normal sinus  rhythm.  PR interval not elongated.  No dropped p-waves or QRS complexes.  No ST depressions or elevation.  Comparable with previous EKG.    Orthostatic Vital Signs BP- Lying:116/80 Pulse- Lying:83 BP- Sitting:116/78 Pulse- Sitting:96 BP- Standing at 0 minutes:111/78 Pulse- Standing at 0 minutes:96  ASSESSMENT & PLAN:  1. Chronic midline low back pain without sciatica   2. Intermittent lightheadedness   3. Mild anemia   4. Increased urinary frequency      Meds ordered this encounter  Medications  . cyclobenzaprine (FLEXERIL) 5 MG tablet    Sig: Take 1 tablet (5 mg total) by mouth at bedtime as needed for muscle spasms.    Dispense:  15 tablet    Refill:  0    Order Specific Question:   Supervising Provider    Answer:   Wynona Luna [[id number]]    Blood work showed mild anemia and hyperglycemia Urine was positive for trace blood and protein.  Culture sent.   Orthostatic vitals WNL EKG Normal sinus rhythm  Continue conservative management of rest, ice, and gentle stretches Take cyclobenzaprine at nighttime for symptomatic relief. Avoid driving or operating heavy machinery while using medication. Referral to pain management for further evaluation and management of chronic back pain Follow up with PCP for multiple other complaints Present to ER if worsening or new symptoms (fever, chills, chest pain, abdominal pain, changes in bowel or bladder habits, pain radiating into lower legs, etc...)    Reviewed expectations re: course of current medical issues. Questions answered. Outlined signs and symptoms indicating need for more acute intervention. Patient verbalized understanding. After Visit Summary given.    Lestine Box, PA-C 05/07/18 1620

## 2018-05-10 ENCOUNTER — Encounter: Payer: Self-pay | Admitting: Family Medicine

## 2018-05-10 ENCOUNTER — Ambulatory Visit (INDEPENDENT_AMBULATORY_CARE_PROVIDER_SITE_OTHER): Payer: Medicare Other | Admitting: Family Medicine

## 2018-05-10 VITALS — BP 120/81 | HR 96 | Temp 98.9°F | Resp 16 | Ht 67.0 in | Wt 211.0 lb

## 2018-05-10 DIAGNOSIS — R358 Other polyuria: Secondary | ICD-10-CM | POA: Diagnosis not present

## 2018-05-10 DIAGNOSIS — D860 Sarcoidosis of lung: Secondary | ICD-10-CM

## 2018-05-10 DIAGNOSIS — R3589 Other polyuria: Secondary | ICD-10-CM

## 2018-05-10 DIAGNOSIS — G8929 Other chronic pain: Secondary | ICD-10-CM | POA: Diagnosis not present

## 2018-05-10 DIAGNOSIS — M545 Low back pain, unspecified: Secondary | ICD-10-CM

## 2018-05-10 DIAGNOSIS — Z6833 Body mass index (BMI) 33.0-33.9, adult: Secondary | ICD-10-CM | POA: Diagnosis not present

## 2018-05-10 LAB — POCT URINALYSIS DIPSTICK
BILIRUBIN UA: NEGATIVE
Glucose, UA: NEGATIVE
Ketones, UA: NEGATIVE
NITRITE UA: NEGATIVE
PH UA: 7 (ref 5.0–8.0)
PROTEIN UA: NEGATIVE
RBC UA: NEGATIVE
SPEC GRAV UA: 1.015 (ref 1.010–1.025)
UROBILINOGEN UA: 1 U/dL

## 2018-05-10 LAB — POCT GLYCOSYLATED HEMOGLOBIN (HGB A1C): Hemoglobin A1C: 4.9

## 2018-05-10 MED ORDER — ALBUTEROL SULFATE HFA 108 (90 BASE) MCG/ACT IN AERS: 2.0000 | INHALATION_SPRAY | Freq: Four times a day (QID) | RESPIRATORY_TRACT | 5 refills | Status: DC | PRN

## 2018-05-10 NOTE — Patient Instructions (Signed)
Sarcoidosis Sarcoidosis is a disease that causes inflammation in your organs and other areas of your body. The lungs are most often affected (pulmonary sarcoidosis). Sarcoidosis can also affect your lymph nodes, liver, eyes, skin, or any other body tissue. When you have sarcoidosis, small clumps of tissue (granulomas) form in the affected area of your body. Granulomas are made up of your body's defense (immune) cells. Inflammation results when your body reacts to a harmful substance. Normally, inflammation goes away after immune cells get rid of the harmful substance. In sarcoidosis, the immune cells form granulomas instead. What are the causes? The exact cause of sarcoidosis is not known. Something triggers the immune system to respond, such as dust, chemicals, bacteria, or a virus. What increases the risk? You may be at a greater risk for sarcoidosis if you:  Have a family history of the disease.  Are African American.  Are of Northern European ancestry.  Are 20-50 years old.  Are female.  What are the signs or symptoms? Many people with sarcoidosis have no symptoms. Others have very mild symptoms. Sarcoidosis most often affects the lungs. Symptoms include:  Chest pain.  Coughing.  Wheezing.  Shortness of breath.  Other common symptoms include:  Night sweats.  Weight loss.  Fatigue.  Depression.  A sense of uneasiness.  How is this diagnosed? Sarcoidosis may be diagnosed by:  Medical history and physical exam.  Chest X-ray. This looks for granulomas in your lungs.  Lung function tests. These measure your breathing and look for problems related to sarcoidosis.  Examining a sample of tissue under a microscope (biopsy).  How is this treated? Sarcoidosis usually clears up without treatment. You may take medicines to reduce inflammation or relieve symptoms. These may include:  Prednisone. This steroid reduces inflammation related to sarcoidosis.  Chloroquine or  hydroxychloroquine. These are antimalarial medicines used to treat sarcoidosis that affects the skin or brain.  Methotrexate, leflunomide, or azathioprine. These medicines affect the immune system and can help with sarcoidosis in the joints, eyes, skin, or lungs.  Inhalers. Inhaled medicines can help you breathe if sarcoidosis is affecting your lungs.  Follow these instructions at home:  Do not use any tobacco products, including cigarettes, chewing tobacco, or electronic cigarettes. If you need help quitting, ask your health care provider.  Avoid secondhand smoke.  Avoid irritating dust and chemicals. Stay indoors on days when air quality is poor in your area.  Take medicines only as directed by your health care provider. Contact a health care provider if:  You have vision problems.  You have shortness of breath.  You have a dry, persistent cough.  You have an irregular heartbeat.  You have pain or ache in your joints, hands, or feet.  You have an unexplained rash. Get help right away if: You have chest pain. This information is not intended to replace advice given to you by your health care provider. Make sure you discuss any questions you have with your health care provider. Document Released: 10/13/2004 Document Revised: 05/20/2016 Document Reviewed: 04/10/2014 Elsevier Interactive Patient Education  2018 [employer]  

## 2018-05-10 NOTE — Progress Notes (Signed)
Subjective:    Patient ID: Rhonda Hester, female    DOB: [birth year], 42 y.o.   MRN: [id number]  HPI Rhonda Hester, a 42 year old female with a history sarcoidosis of lung  presents complaining of chronic low back pain. Rhonda Hester says that pain has been worsening over the past several days. Patient was evaluated in the emergency department on 05/07/2018.  Pain intensity is 5/10 characterized as aching and intermittent. Pain is aggravated by laying down and prolonged sitting. Patient has not used any OTC interventions to alleviate current symptoms.  Patient denies fever, shortness of breath, headache, dizziness, dysuria, abdominal pain, nausea, vomiting,or diarrhea.    Past Medical History:  Diagnosis Date  . Allergic asthma   . Breast mass 05/2017   lt breast lump  . Eczema   . GERD (gastroesophageal reflux disease)   . History of anal fissures   . History of Bell's palsy    2006  RIGHT SIDE--  RESOLVED  . History of gastroesophageal reflux (GERD)   . Hyperlipidemia   . Interstitial cystitis   . Sarcoidosis of lung ([place])    DX 2006--  PULMOLOGIST--  DR YSAY  . Seasonal allergic rhinitis   . Shortness of breath    Social History   Socioeconomic History  . Marital status: Single    Spouse name: Not on file  . Number of children: 0  . Years of education: Not on file  . Highest education level: Not on file  Occupational History  . Occupation: disabled  Social Needs  . Financial resource strain: Not on file  . Food insecurity:    Worry: Not on file    Inability: Not on file  . Transportation needs:    Medical: Not on file    Non-medical: Not on file  Tobacco Use  . Smoking status: Former Smoker    Packs/day: 0.30    Years: 6.00    Pack years: 1.80    Types: Cigarettes, Cigars    Last attempt to quit: 11/2016    Years since quitting: 1.4  . Smokeless tobacco: Never Used  . Tobacco comment: ONE CIG. DAILY /  1 PPMONTH  Substance and Sexual Activity  . Alcohol use:  No    Alcohol/week: 0.0 oz  . Drug use: No    Comment: HX MARIJUANA USE  . Sexual activity: Never    Birth control/protection: None    Comment: abstinence   Lifestyle  . Physical activity:    Days per week: Not on file    Minutes per session: Not on file  . Stress: Not on file  Relationships  . Social connections:    Talks on phone: Not on file    Gets together: Not on file    Attends religious service: Not on file    Active member of club or organization: Not on file    Attends meetings of clubs or organizations: Not on file    Relationship status: Not on file  . Intimate partner violence:    Fear of current or ex partner: Not on file    Emotionally abused: Not on file    Physically abused: Not on file    Forced sexual activity: Not on file  Other Topics Concern  . Not on file  Social History Narrative  . Not on file   Review of Systems  HENT: Negative.   Eyes: Negative.   Respiratory: Negative.   Cardiovascular: Negative.   Gastrointestinal: Negative.   Genitourinary: Negative.  Musculoskeletal: Positive for arthralgias and joint swelling.  Skin: Negative.   Neurological: Negative.   Hematological: Negative.   Psychiatric/Behavioral: Negative.        Objective:   Physical Exam  Constitutional: She is oriented to person, place, and time. She appears well-developed.  HENT:  Head: Normocephalic and atraumatic.  Right Ear: External ear normal.  Left Ear: External ear normal.  Mouth/Throat: Oropharynx is clear and moist. Abnormal dentition. No uvula swelling.  Eyes: Pupils are equal, round, and reactive to light.  Neck: Normal range of motion.  Cardiovascular: Normal rate, regular rhythm, normal heart sounds and intact distal pulses.  Pulmonary/Chest: Effort normal and breath sounds normal.  Abdominal: Soft. Bowel sounds are normal.  Neurological: She is alert and oriented to person, place, and time.  Skin: Skin is warm and dry.  Psychiatric: She has a normal  mood and affect. Her behavior is normal. Judgment and thought content normal.     BP 120/81 (BP Location: Left Arm, Patient Position: Sitting, Cuff Size: Normal)   Pulse 96   Temp 98.9 F (37.2 C) (Oral)   Resp 16   Ht 5\' 7"  (1.702 m)   Wt 211 lb (95.7 kg)   SpO2 100%   BMI 33.05 kg/m   Assessment & Plan:  1. Sarcoidosis of lung ([place]) Recommend that patient follow up with pulmonology as scheduled.  Continue prednisone 20 mg daily 2. Chronic bilateral low back pain without sciatica Continue Gabapentin 900 mg 3 times daily as prescribed  - Urinalysis Dipstick  3. Polyuria  - HgB A1c  4. BMI 33.0-33.9,adult Recommend a lowfat, low carbohydrate diet divided over 5-6 small meals, increase water intake to 6-8 glasses, and 150 minutes per week of cardiovascular exercise.    RTC: 3 months for chronic conditions  [name]  MSN, FNP-C [facility] [address] [place], [place] [address] [phone]

## 2018-05-24 ENCOUNTER — Other Ambulatory Visit: Payer: Self-pay | Admitting: Family Medicine

## 2018-05-24 DIAGNOSIS — G894 Chronic pain syndrome: Secondary | ICD-10-CM

## 2018-05-24 NOTE — Progress Notes (Signed)
Orders Placed This Encounter  Procedures  . Ambulatory referral to Pain Clinic    Referral Priority:   Routine    Referral Type:   Consultation    Referral Reason:   Specialty Services Required    Requested Specialty:   Pain Medicine    Number of Visits Requested:   [place]  MSN, FNP-C [facility] [address] [place], [place] [id number] [phone]

## 2018-05-25 ENCOUNTER — Ambulatory Visit (HOSPITAL_COMMUNITY): Payer: Medicare Other | Admitting: Psychiatry

## 2018-05-26 ENCOUNTER — Other Ambulatory Visit: Payer: Self-pay | Admitting: Pulmonary Disease

## 2018-05-26 DIAGNOSIS — D869 Sarcoidosis, unspecified: Secondary | ICD-10-CM

## 2018-05-28 ENCOUNTER — Emergency Department (HOSPITAL_COMMUNITY): Payer: Medicare Other

## 2018-05-28 ENCOUNTER — Other Ambulatory Visit: Payer: Self-pay

## 2018-05-28 ENCOUNTER — Encounter (HOSPITAL_COMMUNITY): Payer: Self-pay | Admitting: Emergency Medicine

## 2018-05-28 ENCOUNTER — Emergency Department (HOSPITAL_COMMUNITY)
Admission: EM | Admit: 2018-05-28 | Discharge: 2018-05-29 | Disposition: A | Discharge status: Home / Self Care | Payer: Medicare Other | Attending: Emergency Medicine | Admitting: Emergency Medicine

## 2018-05-28 DIAGNOSIS — J111 Influenza due to unidentified influenza virus with other respiratory manifestations: Secondary | ICD-10-CM | POA: Insufficient documentation

## 2018-05-28 DIAGNOSIS — J45909 Unspecified asthma, uncomplicated: Secondary | ICD-10-CM | POA: Insufficient documentation

## 2018-05-28 DIAGNOSIS — Z87891 Personal history of nicotine dependence: Secondary | ICD-10-CM | POA: Diagnosis not present

## 2018-05-28 DIAGNOSIS — R05 Cough: Secondary | ICD-10-CM | POA: Diagnosis present

## 2018-05-28 DIAGNOSIS — F329 Major depressive disorder, single episode, unspecified: Secondary | ICD-10-CM | POA: Insufficient documentation

## 2018-05-28 DIAGNOSIS — R6889 Other general symptoms and signs: Secondary | ICD-10-CM

## 2018-05-28 LAB — CBC WITH DIFFERENTIAL/PLATELET
Abs Immature Granulocytes: 0 10*3/uL (ref 0.0–0.1)
BASOS ABS: 0 10*3/uL (ref 0.0–0.1)
BASOS PCT: 1 %
EOS ABS: 0.3 10*3/uL (ref 0.0–0.7)
EOS PCT: 5 %
HCT: 32.9 % — ABNORMAL LOW (ref 36.0–46.0)
HEMOGLOBIN: 9.9 g/dL — AB (ref 12.0–15.0)
Immature Granulocytes: 0 %
Lymphocytes Relative: 26 %
Lymphs Abs: 1.6 10*3/uL (ref 0.7–4.0)
MCH: 24.9 pg — AB (ref 26.0–34.0)
MCHC: 30.1 g/dL (ref 30.0–36.0)
MCV: 82.7 fL (ref 78.0–100.0)
MONO ABS: 0.5 10*3/uL (ref 0.1–1.0)
Monocytes Relative: 8 %
Neutro Abs: 3.7 10*3/uL (ref 1.7–7.7)
Neutrophils Relative %: 60 %
PLATELETS: 337 10*3/uL (ref 150–400)
RBC: 3.98 MIL/uL (ref 3.87–5.11)
RDW: 17.2 % — AB (ref 11.5–15.5)
WBC: 6.2 10*3/uL (ref 4.0–10.5)

## 2018-05-28 LAB — COMPREHENSIVE METABOLIC PANEL
ALBUMIN: 2.9 g/dL — AB (ref 3.5–5.0)
ALK PHOS: 204 U/L — AB (ref 38–126)
ALT: 46 U/L (ref 14–54)
AST: 39 U/L (ref 15–41)
Anion gap: 8 (ref 5–15)
BILIRUBIN TOTAL: 1 mg/dL (ref 0.3–1.2)
BUN: 11 mg/dL (ref 6–20)
CALCIUM: 9.1 mg/dL (ref 8.9–10.3)
CO2: 24 mmol/L (ref 22–32)
CREATININE: 0.68 mg/dL (ref 0.44–1.00)
Chloride: 102 mmol/L (ref 101–111)
GFR calc Af Amer: >60 mL/min (ref >60)
GFR calc non Af Amer: >60 mL/min (ref >60)
GLUCOSE: 107 mg/dL — AB (ref 65–99)
Potassium: 3.3 mmol/L — ABNORMAL LOW (ref 3.5–5.1)
Sodium: 134 mmol/L — ABNORMAL LOW (ref 135–145)
TOTAL PROTEIN: 8.4 g/dL — AB (ref 6.5–8.1)

## 2018-05-28 LAB — I-STAT BETA HCG BLOOD, ED (MC, WL, AP ONLY)

## 2018-05-28 LAB — I-STAT CG4 LACTIC ACID, ED: Lactic Acid, Venous: 1.79 mmol/L (ref 0.5–1.9)

## 2018-05-28 LAB — D-DIMER, QUANTITATIVE: D-Dimer, Quant: 1.83 ug/mL-FEU — ABNORMAL HIGH (ref 0.00–0.50)

## 2018-05-28 LAB — I-STAT TROPONIN, ED: Troponin i, poc: 0 ng/mL (ref 0.00–0.08)

## 2018-05-28 MED ORDER — IOPAMIDOL (ISOVUE-370) INJECTION 76%
100.0000 mL | Freq: Once | INTRAVENOUS | Status: AC | PRN
  Administered 2018-05-28: 100 mL via INTRAVENOUS

## 2018-05-28 MED ORDER — ACETAMINOPHEN 500 MG PO TABS: 1000.0000 mg | ORAL_TABLET | Freq: Once | ORAL | Status: DC

## 2018-05-28 MED ORDER — IOPAMIDOL (ISOVUE-370) INJECTION 76%
INTRAVENOUS | Status: AC
  Filled 2018-05-28: qty 100

## 2018-05-28 MED ORDER — DIPHENHYDRAMINE HCL 50 MG/ML IJ SOLN
50.0000 mg | Freq: Once | INTRAMUSCULAR | Status: DC
  Filled 2018-05-28: qty 1

## 2018-05-28 MED ORDER — KETOROLAC TROMETHAMINE 15 MG/ML IJ SOLN
15.0000 mg | Freq: Once | INTRAMUSCULAR | Status: AC
  Administered 2018-05-28: 15 mg via INTRAVENOUS
  Filled 2018-05-28: qty 1

## 2018-05-28 MED ORDER — DOXYCYCLINE HYCLATE 100 MG PO CAPS: 100.0000 mg | ORAL_CAPSULE | Freq: Two times a day (BID) | ORAL | 0 refills | Status: DC

## 2018-05-28 MED ORDER — DIPHENHYDRAMINE HCL 50 MG/ML IJ SOLN
25.0000 mg | Freq: Once | INTRAMUSCULAR | Status: AC
  Administered 2018-05-28: 25 mg via INTRAVENOUS
  Filled 2018-05-28: qty 1

## 2018-05-28 MED ORDER — SODIUM CHLORIDE 0.9 % IV BOLUS
1000.0000 mL | Freq: Once | INTRAVENOUS | Status: AC
  Administered 2018-05-28: 1000 mL via INTRAVENOUS

## 2018-05-28 MED ORDER — PROCHLORPERAZINE EDISYLATE 10 MG/2ML IJ SOLN
10.0000 mg | Freq: Once | INTRAMUSCULAR | Status: AC
  Administered 2018-05-28: 10 mg via INTRAVENOUS
  Filled 2018-05-28: qty 2

## 2018-05-28 MED ORDER — BENZONATATE 100 MG PO CAPS: 100.0000 mg | ORAL_CAPSULE | Freq: Three times a day (TID) | ORAL | 0 refills | Status: DC

## 2018-05-28 NOTE — ED Provider Notes (Signed)
[facility] EMERGENCY DEPARTMENT Provider Note   CSN: [id number] Arrival date & time: 05/28/18  2046     History   Chief Complaint Chief Complaint  Patient presents with  . Generalized Body Aches    HPI Rhonda Hester is a 42 y.o. female.  42 yo F with a chief complaint of cough congestion fevers chills myalgias.  This been going on for the past 3 weeks.  No sick contacts no recent travel no hemoptysis no lower extremity edema.  She denies any tick bites or rashes.  Is immunized.  Some nausea but denies vomiting.  Denies abdominal pain.  Having some right flank pain.  Denies dysuria increased frequency or hesitancy.  Feels short of breath on exertion and has to stop and take a rest after she walks a short distance.  The history is provided by the patient.  Illness  This is a new problem. The current episode started more than 1 week ago. The problem occurs constantly. The problem has not changed since onset.Associated symptoms include headaches and shortness of breath. Pertinent negatives include no chest pain and no abdominal pain. The symptoms are aggravated by walking. Nothing relieves the symptoms. She has tried nothing for the symptoms. The treatment provided no relief.    Past Medical History:  Diagnosis Date  . Allergic asthma   . Breast mass 05/2017   lt breast lump  . Eczema   . GERD (gastroesophageal reflux disease)   . History of anal fissures   . History of Bell's palsy    2006  RIGHT SIDE--  RESOLVED  . History of gastroesophageal reflux (GERD)   . Hyperlipidemia   . Interstitial cystitis   . Sarcoidosis of lung ([place])    DX 2006--  PULMOLOGIST--  DR FOYD  . Seasonal allergic rhinitis   . Shortness of breath     Patient Active Problem List   Diagnosis Date Noted  . Enlarged submental lymph node 03/30/2018  . Nodule of soft tissue 03/22/2018  . Hemorrhoids 02/13/2018  . TMJ (temporomandibular joint disorder) 12/23/2017  . Acute pain of  right shoulder 11/26/2017  . Chronic bilateral low back pain without sciatica 08/01/2017  . Chronic pain of right knee 08/01/2017  . Depression due to physical illness 04/02/2017  . Pelvic pain 03/28/2017  . Allergic reaction to food 03/19/2017  . Obesity (BMI 30.0-34.9) 03/19/2017  . Generalized pain 03/19/2017  . Metabolic syndrome 74/11/8785  . Medication management 03/19/2017  . Tobacco abuse 01/24/2017  . Uterine fibroid 01/17/2017  . Interstitial cystitis 01/17/2017  . Menorrhagia 12/06/2016  . Acne vulgaris 05/10/2016  . BMI 31.0-31.9,adult 05/10/2016  . Weight gain 05/10/2016  . Right hip pain 10/27/2014  . Sarcoidosis of lung ([place])   . Cervical adenopathy 03/12/2014  . History of smoking 08/07/2013  . Allergic asthma 07/22/2011    Past Surgical History:  Procedure Laterality Date  . CYSTO WITH HYDRODISTENSION N/A 08/24/2013   Procedure: CYSTOSCOPY/HYDRODISTENSION with instillation of marcaine and pyridium;  Surgeon: Fredricka Bonine, MD;  Location: [facility];  Service: Urology;  Laterality: N/A;  . CYSTOSCOPY/ HYDRODISTENTION/ BLADDER BX  06-09-2010  . DILATION AND CURETTAGE OF UTERUS  1997     OB History    Gravida  1   Para  0   Term  0   Preterm  0   AB  1   Living  0     SAB  1   TAB  0  Ectopic  0   Multiple  0   Live Births               Home Medications    Prior to Admission medications   Medication Sig Start Date End Date Taking? Authorizing Provider  albuterol (PROAIR HFA) 108 (90 Base) MCG/ACT inhaler Inhale 2 puffs into the lungs every 6 (six) hours as needed for shortness of breath. Patient not taking: Reported on 05/28/2018 05/10/18   Dorena Dew, FNP  benzonatate (TESSALON) 100 MG capsule Take 1 capsule (100 mg total) by mouth every 8 (eight) hours. 05/28/18   Deno Etienne, DO  cetirizine (ZYRTEC) 10 MG tablet Take 1 tablet (10 mg total) by mouth daily. Patient not taking: Reported on 05/28/2018 03/17/18    Dorena Dew, FNP  cyclobenzaprine (FLEXERIL) 5 MG tablet Take 1 tablet (5 mg total) by mouth at bedtime as needed for muscle spasms. Patient not taking: Reported on 05/28/2018 05/07/18   Wurst, [place], PA-C  doxycycline (VIBRAMYCIN) 100 MG capsule Take 1 capsule (100 mg total) by mouth 2 (two) times daily. 05/28/18   Deno Etienne, DO  DULoxetine (CYMBALTA) 30 MG capsule Take 1 capsule (30 mg total) by mouth daily for 7 days, THEN 1 capsule (30 mg total) 2 (two) times daily. Patient not taking: No sig reported 03/28/18 06/26/18  Aundra Dubin, MD  gabapentin (NEURONTIN) 300 MG capsule Take 3 capsules (900 mg total) by mouth at bedtime. Patient not taking: Reported on 05/28/2018 03/28/18   Aundra Dubin, MD  hydrocortisone (ANUSOL-HC) 2.5 % rectal cream Place 1 application rectally 2 (two) times daily. Patient not taking: Reported on 05/28/2018 04/25/18   Levin Erp, PA  traMADol (ULTRAM) 50 MG tablet Take 1 tablet (50 mg total) by mouth every 6 (six) hours as needed. Patient not taking: Reported on 05/28/2018 04/25/18   Levin Erp, PA    Family History Family History  Problem Relation Age of Onset  . Brain cancer Maternal Grandmother   . Hyperlipidemia Other   . Sleep apnea Other   . Depression Maternal Aunt   . Seizures Maternal Uncle     Social History Social History   Tobacco Use  . Smoking status: Former Smoker    Packs/day: 0.30    Years: 6.00    Pack years: 1.80    Types: Cigarettes, Cigars    Last attempt to quit: 11/2016    Years since quitting: 1.5  . Smokeless tobacco: Never Used  . Tobacco comment: ONE CIG. DAILY /  1 PPMONTH  Substance Use Topics  . Alcohol use: No    Alcohol/week: 0.0 oz  . Drug use: No    Comment: HX MARIJUANA USE     Allergies   Acyclovir and related; Citric acid; Dust mite extract; Grapeseed extract [nutritional supplements]; Pineapple; Pollen extract; and Nickel   Review of Systems Review of Systems    Constitutional: Negative for chills and fever.  HENT: Negative for congestion and rhinorrhea.   Eyes: Negative for redness and visual disturbance.  Respiratory: Positive for shortness of breath. Negative for wheezing.   Cardiovascular: Negative for chest pain and palpitations.  Gastrointestinal: Negative for abdominal pain, nausea and vomiting.  Genitourinary: Negative for dysuria and urgency.  Musculoskeletal: Negative for arthralgias and myalgias.  Skin: Negative for pallor and wound.  Neurological: Positive for headaches. Negative for dizziness.     Physical Exam Updated Vital Signs BP 117/76   Pulse 87   Temp 100.3 F (  37.9 C) (Oral)   Resp (!) 23   Ht 5\' 7"  (1.702 m)   Wt 93.9 kg (207 lb)   SpO2 95%   BMI 32.42 kg/m   Physical Exam  Constitutional: She is oriented to person, place, and time. She appears well-developed and well-nourished. No distress.  HENT:  Head: Normocephalic and atraumatic.  Swollen turbinates, posterior nasal drip, no noted sinus ttp, tm normal bilaterally.    Eyes: Pupils are equal, round, and reactive to light. EOM are normal.  Neck: Normal range of motion. Neck supple.  Cardiovascular: Normal rate and regular rhythm. Exam reveals no gallop and no friction rub.  No murmur heard. Pulmonary/Chest: Effort normal. She has no wheezes. She has no rales.  Abdominal: Soft. She exhibits no distension. There is no tenderness.  Musculoskeletal: She exhibits no edema or tenderness.  Neurological: She is alert and oriented to person, place, and time.  Skin: Skin is warm and dry. She is not diaphoretic.  Psychiatric: She has a normal mood and affect. Her behavior is normal.  Nursing note and vitals reviewed.    ED Treatments / Results  Labs (all labs ordered are listed, but only abnormal results are displayed) Labs Reviewed  COMPREHENSIVE METABOLIC PANEL - Abnormal; Notable for the following components:      Result Value   Sodium 134 (*)     Potassium 3.3 (*)    Glucose, Bld 107 (*)    Total Protein 8.4 (*)    Albumin 2.9 (*)    Alkaline Phosphatase 204 (*)    All other components within normal limits  CBC WITH DIFFERENTIAL/PLATELET - Abnormal; Notable for the following components:   Hemoglobin 9.9 (*)    HCT 32.9 (*)    MCH 24.9 (*)    RDW 17.2 (*)    All other components within normal limits  D-DIMER, QUANTITATIVE (NOT AT [facility]) - Abnormal; Notable for the following components:   D-Dimer, Quant 1.83 (*)    All other components within normal limits  URINALYSIS, ROUTINE W REFLEX MICROSCOPIC  I-STAT CG4 LACTIC ACID, ED  I-STAT BETA HCG BLOOD, ED ([facility], [facility], [facility] ONLY)  I-STAT CG4 LACTIC ACID, ED  I-STAT TROPONIN, ED    EKG EKG Interpretation  Date/Time:  Sunday May 28 2018 20:52:48 EDT Ventricular Rate:  102 PR Interval:  162 QRS Duration: 68 QT Interval:  314 QTC Calculation: 409 R Axis:   59 Text Interpretation:  Sinus tachycardia Nonspecific T wave abnormality Abnormal ECG No significant change since last tracing Confirmed by Deno Etienne [phone]) on 05/28/2018 9:27:40 PM   Radiology Dg Chest 2 View  Result Date: 05/28/2018 CLINICAL DATA:  42 year old female with history of severe body aches, chills, shortness of breath and dry mouth. EXAM: CHEST - 2 VIEW COMPARISON:  Multiple priors, most recently 01/02/2018. FINDINGS: Widespread interstitial prominence and extensive architectural distortion noted throughout the lungs bilaterally, similar to prior examinations, compatible with the patient's reported clinical history of sarcoidosis. No definite acute consolidative airspace disease. No pleural effusions. No evidence of pulmonary edema. Heart size is normal. IMPRESSION: 1. Chronic changes related sarcoidosis redemonstrated. No new acute findings are noted. Electronically Signed   By: Vinnie Langton M.D.   On: 05/28/2018 21:48   Ct Angio Chest Pe W And/or Wo Contrast  Result Date: 05/28/2018 CLINICAL DATA:  PE suspected,  intermediate prob, positive D-dimer. Shortness of breath. History of sarcoidosis. EXAM: CT ANGIOGRAPHY CHEST WITH CONTRAST TECHNIQUE: Multidetector CT imaging of the chest was performed using the  standard protocol during bolus administration of intravenous contrast. Multiplanar CT image reconstructions and MIPs were obtained to evaluate the vascular anatomy. CONTRAST:  156mL ISOVUE-370 IOPAMIDOL (ISOVUE-370) INJECTION 76% COMPARISON:  Chest radiograph earlier this day. High-resolution chest CT 04/07/2018 FINDINGS: Cardiovascular: There are no filling defects within the pulmonary arteries to suggest pulmonary embolus. Thoracic aorta is normal in caliber without dissection. Heart is normal in size. No pericardial effusion. Mediastinum/Nodes: Again seen bilateral hilar adenopathy. Enlarged right internal mammary node is slightly more prominent currently measuring 15 mm short axis, previously 12 mm. Prominent right lower paratracheal and subcarinal nodes are also unchanged from recent prior. The esophagus is decompressed. No visualized thyroid nodule. Lungs/Pleura: Breathing motion artifact partially obscures evaluation. Lung parenchymal changes of sarcoidosis with bronchiectasis, ground-glass opacities and architectural distortion appears similar to prior exam allowing for breathing motion. No new focal airspace disease. No pleural effusion. Trachea and mainstem bronchi are patent. Upper Abdomen: Splenomegaly is partially included. No acute finding. Musculoskeletal: There are no acute or suspicious osseous abnormalities. Review of the MIP images confirms the above findings. IMPRESSION: 1. No pulmonary embolus. 2. Stable chronic findings of hilar and mediastinal adenopathy as well as pulmonary parenchymal changes consistent with sarcoidosis, grossly stable from recent high-resolution chest CT. No new abnormality. Electronically Signed   By: Jeb Levering M.D.   On: 05/28/2018 23:35    Procedures Procedures  (including critical care time)  Medications Ordered in ED Medications  acetaminophen (TYLENOL) tablet 1,000 mg (1,000 mg Oral Refused 05/28/18 2210)  iopamidol (ISOVUE-370) 76 % injection (has no administration in time range)  sodium chloride 0.9 % bolus 1,000 mL (0 mLs Intravenous Stopped 05/28/18 2232)  ketorolac (TORADOL) 15 MG/ML injection 15 mg (15 mg Intravenous Given 05/28/18 2201)  prochlorperazine (COMPAZINE) injection 10 mg (10 mg Intravenous Given 05/28/18 2328)  diphenhydrAMINE (BENADRYL) injection 25 mg (25 mg Intravenous Given 05/28/18 2328)  iopamidol (ISOVUE-370) 76 % injection 100 mL (100 mLs Intravenous Contrast Given 05/28/18 2300)     Initial Impression / Assessment and Plan / ED Course  I have reviewed the triage vital signs and the nursing notes.  Pertinent labs & imaging results that were available during my care of the patient were reviewed by me and considered in my medical decision making (see chart for details).     42 yo F with a chief complaint of cough congestion fever chills and myalgias.  Is been going on for 3 weeks.  Worse when she lays back at night.  Denies sick contacts denies recent travel.  My exam she is well-appearing and nontoxic there is no tachycardia her lungs are clear.  No signs of upper respiratory illness on HEENT.  She is complaining of some shortness of breath on exertion and has an O2 sat in the low 90s on room air.  Her symptoms sound mostly viral in nature though they are a bit longer course than I would have expected.  Will obtain a d-dimer for the possibility of low-grade fevers and exertional shortness of breath. Lytes unremarkable. Lactic normal.  Not pregnant.   Ddimer +, CTA negative for PE, no pna.  Will treat with doxy for possible tick borne illness.  D/c home.   11:45 PM:  I have discussed the diagnosis/risks/treatment options with the patient and believe the pt to be eligible for discharge home to follow-up with PCP. We also discussed  returning to the ED immediately if new or worsening sx occur. We discussed the sx which are most concerning (  e.g., sudden worsening pain, fever, inability to tolerate by mouth) that necessitate immediate return. Medications administered to the patient during their visit and any new prescriptions provided to the patient are listed below.  Medications given during this visit Medications  acetaminophen (TYLENOL) tablet 1,000 mg (1,000 mg Oral Refused 05/28/18 2210)  iopamidol (ISOVUE-370) 76 % injection (has no administration in time range)  sodium chloride 0.9 % bolus 1,000 mL (0 mLs Intravenous Stopped 05/28/18 2232)  ketorolac (TORADOL) 15 MG/ML injection 15 mg (15 mg Intravenous Given 05/28/18 2201)  prochlorperazine (COMPAZINE) injection 10 mg (10 mg Intravenous Given 05/28/18 2328)  diphenhydrAMINE (BENADRYL) injection 25 mg (25 mg Intravenous Given 05/28/18 2328)  iopamidol (ISOVUE-370) 76 % injection 100 mL (100 mLs Intravenous Contrast Given 05/28/18 2300)      The patient appears reasonably screen and/or stabilized for discharge and I doubt any other medical condition or other [facility] requiring further screening, evaluation, or treatment in the ED at this time prior to discharge.    Final Clinical Impressions(s) / ED Diagnoses   Final diagnoses:  Flu-like symptoms    ED Discharge Orders        Ordered    doxycycline (VIBRAMYCIN) 100 MG capsule  2 times daily     05/28/18 2341    benzonatate (TESSALON) 100 MG capsule  Every 8 hours     05/28/18 [place], Xitlally Mooneyham, DO 05/28/18 2345

## 2018-05-28 NOTE — ED Notes (Signed)
Nurse to collect labs on VI start.

## 2018-05-28 NOTE — Discharge Instructions (Addendum)
Good news, no blood clot in your lung on CT.  No pneumonia.  Please follow up with your PCP. Return for worsening symptoms.   Take 4 over the counter ibuprofen tablets 3 times a day or 2 over-the-counter naproxen tablets twice a day for pain. Also take tylenol 1000mg (2 extra strength) four times a day.

## 2018-05-28 NOTE — ED Triage Notes (Signed)
Pt presents with severe body aches, chills, shob, dry mouth s/s worse during sleep.

## 2018-05-29 ENCOUNTER — Ambulatory Visit: Payer: Self-pay | Admitting: Pulmonary Disease

## 2018-05-29 DIAGNOSIS — J111 Influenza due to unidentified influenza virus with other respiratory manifestations: Secondary | ICD-10-CM | POA: Diagnosis not present

## 2018-05-29 MED ORDER — DIPHENHYDRAMINE HCL 50 MG/ML IJ SOLN
25.0000 mg | Freq: Once | INTRAMUSCULAR | Status: AC
  Administered 2018-05-29: 25 mg via INTRAVENOUS

## 2018-05-29 NOTE — ED Notes (Signed)
Pt understood dc material. NAD noted. Script given at dc  

## 2018-06-01 ENCOUNTER — Other Ambulatory Visit (HOSPITAL_COMMUNITY): Payer: Self-pay | Admitting: Nurse Practitioner

## 2018-06-01 DIAGNOSIS — D869 Sarcoidosis, unspecified: Secondary | ICD-10-CM

## 2018-06-01 DIAGNOSIS — E7889 Other lipoprotein metabolism disorders: Secondary | ICD-10-CM

## 2018-06-01 DIAGNOSIS — R748 Abnormal levels of other serum enzymes: Secondary | ICD-10-CM

## 2018-06-01 DIAGNOSIS — E8889 Other specified metabolic disorders: Secondary | ICD-10-CM

## 2018-06-06 ENCOUNTER — Other Ambulatory Visit: Payer: Self-pay

## 2018-06-06 ENCOUNTER — Emergency Department (HOSPITAL_COMMUNITY): Payer: Medicare Other

## 2018-06-06 ENCOUNTER — Ambulatory Visit (HOSPITAL_COMMUNITY)
Admission: EM | Admit: 2018-06-06 | Discharge: 2018-06-06 | Disposition: A | Discharge status: Home / Self Care | Payer: Medicare Other | Source: Home / Self Care

## 2018-06-06 ENCOUNTER — Encounter (HOSPITAL_COMMUNITY): Payer: Self-pay | Admitting: *Deleted

## 2018-06-06 ENCOUNTER — Emergency Department (HOSPITAL_COMMUNITY)
Admission: EM | Admit: 2018-06-06 | Discharge: 2018-06-06 | Disposition: A | Discharge status: Home / Self Care | Payer: Medicare Other | Attending: Emergency Medicine | Admitting: Emergency Medicine

## 2018-06-06 DIAGNOSIS — D869 Sarcoidosis, unspecified: Secondary | ICD-10-CM | POA: Insufficient documentation

## 2018-06-06 DIAGNOSIS — R4781 Slurred speech: Secondary | ICD-10-CM | POA: Diagnosis not present

## 2018-06-06 DIAGNOSIS — E785 Hyperlipidemia, unspecified: Secondary | ICD-10-CM | POA: Insufficient documentation

## 2018-06-06 DIAGNOSIS — R200 Anesthesia of skin: Secondary | ICD-10-CM | POA: Diagnosis not present

## 2018-06-06 DIAGNOSIS — G44209 Tension-type headache, unspecified, not intractable: Secondary | ICD-10-CM | POA: Insufficient documentation

## 2018-06-06 DIAGNOSIS — R531 Weakness: Secondary | ICD-10-CM | POA: Insufficient documentation

## 2018-06-06 DIAGNOSIS — Z87891 Personal history of nicotine dependence: Secondary | ICD-10-CM | POA: Diagnosis not present

## 2018-06-06 DIAGNOSIS — Z79899 Other long term (current) drug therapy: Secondary | ICD-10-CM | POA: Diagnosis not present

## 2018-06-06 LAB — COMPREHENSIVE METABOLIC PANEL
ALBUMIN: 3.1 g/dL — AB (ref 3.5–5.0)
ALK PHOS: 213 U/L — AB (ref 38–126)
ALT: 66 U/L — AB (ref 14–54)
ANION GAP: 12 (ref 5–15)
AST: 69 U/L — ABNORMAL HIGH (ref 15–41)
BILIRUBIN TOTAL: 1.2 mg/dL (ref 0.3–1.2)
BUN: 10 mg/dL (ref 6–20)
CALCIUM: 9.7 mg/dL (ref 8.9–10.3)
CO2: 23 mmol/L (ref 22–32)
CREATININE: 0.65 mg/dL (ref 0.44–1.00)
Chloride: 105 mmol/L (ref 101–111)
GFR calc Af Amer: >60 mL/min (ref >60)
GFR calc non Af Amer: >60 mL/min (ref >60)
GLUCOSE: 161 mg/dL — AB (ref 65–99)
Potassium: 3.3 mmol/L — ABNORMAL LOW (ref 3.5–5.1)
Sodium: 140 mmol/L (ref 135–145)
TOTAL PROTEIN: 8.7 g/dL — AB (ref 6.5–8.1)

## 2018-06-06 LAB — I-STAT CHEM 8, ED
BUN: 13 mg/dL (ref 6–20)
CALCIUM ION: 1.23 mmol/L (ref 1.15–1.40)
CREATININE: 0.5 mg/dL (ref 0.44–1.00)
Chloride: 103 mmol/L (ref 101–111)
Glucose, Bld: 166 mg/dL — ABNORMAL HIGH (ref 65–99)
HCT: 37 % (ref 36.0–46.0)
Hemoglobin: 12.6 g/dL (ref 12.0–15.0)
Potassium: 3.9 mmol/L (ref 3.5–5.1)
SODIUM: 141 mmol/L (ref 135–145)
TCO2: 25 mmol/L (ref 22–32)

## 2018-06-06 LAB — PROTIME-INR
INR: 1.08
Prothrombin Time: 13.9 seconds (ref 11.4–15.2)

## 2018-06-06 LAB — DIFFERENTIAL
Abs Immature Granulocytes: 0 10*3/uL (ref 0.0–0.1)
Basophils Absolute: 0.1 10*3/uL (ref 0.0–0.1)
Basophils Relative: 1 %
EOS PCT: 13 %
Eosinophils Absolute: 0.7 10*3/uL (ref 0.0–0.7)
IMMATURE GRANULOCYTES: 0 %
LYMPHS PCT: 25 %
Lymphs Abs: 1.4 10*3/uL (ref 0.7–4.0)
Monocytes Absolute: 0.5 10*3/uL (ref 0.1–1.0)
Monocytes Relative: 9 %
Neutro Abs: 2.8 10*3/uL (ref 1.7–7.7)
Neutrophils Relative %: 52 %

## 2018-06-06 LAB — CBC
HEMATOCRIT: 35.9 % — AB (ref 36.0–46.0)
Hemoglobin: 10.6 g/dL — ABNORMAL LOW (ref 12.0–15.0)
MCH: 24.9 pg — ABNORMAL LOW (ref 26.0–34.0)
MCHC: 29.5 g/dL — AB (ref 30.0–36.0)
MCV: 84.3 fL (ref 78.0–100.0)
Platelets: 323 10*3/uL (ref 150–400)
RBC: 4.26 MIL/uL (ref 3.87–5.11)
RDW: 17 % — AB (ref 11.5–15.5)
WBC: 5.5 10*3/uL (ref 4.0–10.5)

## 2018-06-06 LAB — I-STAT TROPONIN, ED: Troponin i, poc: 0 ng/mL (ref 0.00–0.08)

## 2018-06-06 LAB — APTT: aPTT: 36 seconds (ref 24–36)

## 2018-06-06 LAB — I-STAT BETA HCG BLOOD, ED (MC, WL, AP ONLY): I-stat hCG, quantitative: <5 m[IU]/mL (ref <5)

## 2018-06-06 MED ORDER — METOCLOPRAMIDE HCL 10 MG PO TABS: 10.0000 mg | ORAL_TABLET | Freq: Four times a day (QID) | ORAL | 0 refills | Status: DC

## 2018-06-06 MED ORDER — LORAZEPAM 2 MG/ML IJ SOLN
1.0000 mg | Freq: Once | INTRAMUSCULAR | Status: AC
  Administered 2018-06-06: 1 mg via INTRAVENOUS
  Filled 2018-06-06: qty 1

## 2018-06-06 MED ORDER — KETOROLAC TROMETHAMINE 30 MG/ML IJ SOLN
30.0000 mg | Freq: Once | INTRAMUSCULAR | Status: AC
  Administered 2018-06-06: 30 mg via INTRAVENOUS
  Filled 2018-06-06: qty 1

## 2018-06-06 MED ORDER — GADOBENATE DIMEGLUMINE 529 MG/ML IV SOLN
20.0000 mL | Freq: Once | INTRAVENOUS | Status: AC | PRN
  Administered 2018-06-06: 20 mL via INTRAVENOUS

## 2018-06-06 MED ORDER — IBUPROFEN 800 MG PO TABS: 800.0000 mg | ORAL_TABLET | Freq: Three times a day (TID) | ORAL | 0 refills | Status: DC | PRN

## 2018-06-06 MED ORDER — METOCLOPRAMIDE HCL 5 MG/ML IJ SOLN
10.0000 mg | Freq: Once | INTRAMUSCULAR | Status: AC
  Administered 2018-06-06: 10 mg via INTRAVENOUS
  Filled 2018-06-06: qty 2

## 2018-06-06 NOTE — ED Provider Notes (Signed)
Emergency Department Provider Note   I have reviewed the triage vital signs and the nursing notes.   HISTORY  Chief Complaint Numbness and Weakness   HPI Chauntay Paszkiewicz is a 42 y.o. female with PMH of GERD, HLD, and Sarcoidosis presents to the emergency department for evaluation of acute onset right face weakness/numbness with right arm symptoms as well.  She reports that at 3 PM yesterday the patient developed the above-mentioned symptoms with associated moderate headache.  She does have a history of similar headaches as well as a Bell's palsy history but her Bell's palsy resolved completely.  She denies any fevers or chills.  No symptoms in the leg.  She notes some associated discomfort in the palm of the right hand but nonradiating.  She experienced some shortness of breath overnight when she woke up in a panic but that resolved.  When her weakness/numbness remained this morning she called her PCP who referred her to the emergency department.   Of note, the patient also endorses some persistent lightheadedness with a slight vertigo quality. Not worse with movements.   Past Medical History:  Diagnosis Date  . Allergic asthma   . Breast mass 05/2017   lt breast lump  . Eczema   . GERD (gastroesophageal reflux disease)   . History of anal fissures   . History of Bell's palsy    2006  RIGHT SIDE--  RESOLVED  . History of gastroesophageal reflux (GERD)   . Hyperlipidemia   . Interstitial cystitis   . Sarcoidosis of lung ([place])    DX 2006--  PULMOLOGIST--  DR YQMV  . Seasonal allergic rhinitis   . Shortness of breath     Patient Active Problem List   Diagnosis Date Noted  . Enlarged submental lymph node 03/30/2018  . Nodule of soft tissue 03/22/2018  . Hemorrhoids 02/13/2018  . TMJ (temporomandibular joint disorder) 12/23/2017  . Acute pain of right shoulder 11/26/2017  . Chronic bilateral low back pain without sciatica 08/01/2017  . Chronic pain of right knee  08/01/2017  . Depression due to physical illness 04/02/2017  . Pelvic pain 03/28/2017  . Allergic reaction to food 03/19/2017  . Obesity (BMI 30.0-34.9) 03/19/2017  . Generalized pain 03/19/2017  . Metabolic syndrome 78/46/9629  . Medication management 03/19/2017  . Tobacco abuse 01/24/2017  . Uterine fibroid 01/17/2017  . Interstitial cystitis 01/17/2017  . Menorrhagia 12/06/2016  . Acne vulgaris 05/10/2016  . BMI 31.0-31.9,adult 05/10/2016  . Weight gain 05/10/2016  . Right hip pain 10/27/2014  . Sarcoidosis of lung ([place])   . Cervical adenopathy 03/12/2014  . History of smoking 08/07/2013  . Allergic asthma 07/22/2011    Past Surgical History:  Procedure Laterality Date  . CYSTO WITH HYDRODISTENSION N/A 08/24/2013   Procedure: CYSTOSCOPY/HYDRODISTENSION with instillation of marcaine and pyridium;  Surgeon: Fredricka Bonine, MD;  Location: [facility];  Service: Urology;  Laterality: N/A;  . CYSTOSCOPY/ HYDRODISTENTION/ BLADDER BX  06-09-2010  . DILATION AND CURETTAGE OF UTERUS  1997    Current Outpatient Rx  . Order #: [id number] Class: Normal  . Order #: [id number] Class: Normal  . Order #: [id number] Class: Print  . Order #: [id number] Class: Normal  . Order #: [id number] Class: Normal  . Order #: [id number] Class: Normal  . Order #: [id number] Class: Normal  . Order #: [id number] Class: Print  . Order #: [id number] Class: Print  . Order #: [id number] Class: Print    Allergies Acyclovir and related; Citric acid; Dust mite extract; Grapeseed extract [nutritional supplements];  Pineapple; Pollen extract; and Nickel  Family History  Problem Relation Age of Onset  . Brain cancer Maternal Grandmother   . Hyperlipidemia Other   . Sleep apnea Other   . Depression Maternal Aunt   . Seizures Maternal Uncle     Social History Social History   Tobacco Use  . Smoking status: Former Smoker    Packs/day: 0.30    Years: 6.00    Pack years: 1.80    Types:  Cigarettes, Cigars    Last attempt to quit: 11/2016    Years since quitting: 1.5  . Smokeless tobacco: Never Used  . Tobacco comment: ONE CIG. DAILY /  1 PPMONTH  Substance Use Topics  . Alcohol use: No    Alcohol/week: 0.0 oz  . Drug use: No    Comment: HX MARIJUANA USE    Review of Systems  Constitutional: No fever/chills Eyes: No visual changes. ENT: No sore throat. Cardiovascular: Denies chest pain. Respiratory: Denies shortness of breath. Gastrointestinal: No abdominal pain.  No nausea, no vomiting.  No diarrhea.  No constipation. Genitourinary: Negative for dysuria. Musculoskeletal: Negative for back pain.  Skin: Negative for rash. Neurological: Negative for focal weakness or numbness. Positive HA with right face weakness/numbness and RUE weakness/numbness.   10-point ROS otherwise negative.  ____________________________________________   PHYSICAL EXAM:  VITAL SIGNS: ED Triage Vitals  Enc Vitals Group     BP 06/06/18 1211 124/88     Pulse Rate 06/06/18 1211 80     Resp 06/06/18 1211 18     Temp 06/06/18 1211 98.8 F (37.1 C)     Temp Source 06/06/18 1211 Oral     SpO2 06/06/18 1211 97 %     Weight 06/06/18 1221 205 lb (93 kg)     Height 06/06/18 1221 5\' 7"  (1.702 m)     Pain Score 06/06/18 1220 5   Constitutional: Alert and oriented. Well appearing and in no acute distress. Eyes: Conjunctivae are normal. PERRL. EOMI. Head: Atraumatic. Nose: No congestion/rhinnorhea. Mouth/Throat: Mucous membranes are moist.  Oropharynx non-erythematous. Neck: No stridor.   Cardiovascular: Normal rate, regular rhythm. Good peripheral circulation. Grossly normal heart sounds.   Respiratory: Normal respiratory effort.  No retractions. Lungs CTAB. Gastrointestinal: Soft and nontender. No distention.  Musculoskeletal: No lower extremity tenderness nor edema. No gross deformities of extremities. Neurologic:  Normal speech and language. Decreased sensation to light touch over  the right face with some mild swelling noted. Decreased sensation to light touch of the RUE. Normal strength. No pronator drift.  Skin:  Skin is warm, dry and intact. No rash noted. Psychiatric: Mood and affect are normal. Speech and behavior are normal.  ____________________________________________   LABS (all labs ordered are listed, but only abnormal results are displayed)  Labs Reviewed  CBC - Abnormal; Notable for the following components:      Result Value   Hemoglobin 10.6 (*)    HCT 35.9 (*)    MCH 24.9 (*)    MCHC 29.5 (*)    RDW 17.0 (*)    All other components within normal limits  COMPREHENSIVE METABOLIC PANEL - Abnormal; Notable for the following components:   Potassium 3.3 (*)    Glucose, Bld 161 (*)    Total Protein 8.7 (*)    Albumin 3.1 (*)    AST 69 (*)    ALT 66 (*)    Alkaline Phosphatase 213 (*)    All other components within normal limits  I-STAT CHEM 8, ED -  Abnormal; Notable for the following components:   Glucose, Bld 166 (*)    All other components within normal limits  GROUP A STREP BY PCR  PROTIME-INR  APTT  DIFFERENTIAL  I-STAT TROPONIN, ED  I-STAT BETA HCG BLOOD, ED ([facility], [facility], [facility] ONLY)   ____________________________________________  EKG   EKG Interpretation  Date/Time:  Tuesday June 06 2018 12:19:54 EDT Ventricular Rate:  88 PR Interval:  156 QRS Duration: 80 QT Interval:  330 QTC Calculation: 399 R Axis:   69 Text Interpretation:  Normal sinus rhythm Possible Left atrial enlargement T wave abnormality, consider inferior ischemia Abnormal ECG No STEMI.  Confirmed by Nanda Quinton [phone]) on 06/06/2018 4:10:21 PM       ____________________________________________  RADIOLOGY  Ct Head Wo Contrast  Result Date: 06/06/2018 CLINICAL DATA:  Right facial and right upper extremity numbness EXAM: CT HEAD WITHOUT CONTRAST TECHNIQUE: Contiguous axial images were obtained from the base of the skull through the vertex without intravenous  contrast. COMPARISON:  October 06, 2004 head CT; neck CT July 09, 2014 FINDINGS: Brain: The ventricles are normal in size and configuration. There is no intracranial mass, hemorrhage, extra-axial fluid collection, or midline shift. Gray-white compartments appear normal. No acute infarct evident. Vascular: There is no hyperdense vessel. No vascular calcification evident. Skull: Bony calvarium appears intact. Sinuses/Orbits: There is opacification in multiple ethmoid air cells. There is mucosal thickening in the superior right maxillary antrum. Frontal sinuses are essentially aplastic. Visualized orbits appear symmetric bilaterally. Other: Mastoid air cells are clear. There are masses seen in each parotid gland with several masses seen on the left, largest measuring 1 x 1 cm. IMPRESSION: 1. No intracranial mass or hemorrhage. Gray-white compartments appear normal. 2. Small masses in the parotid glands, also present previously. Questions small lymph nodes versus small Warthin's tumors. 3. Areas of paranasal sinus disease, primarily in the ethmoid regions. Electronically Signed   By: Lowella Grip III M.D.   On: 06/06/2018 13:35   Mr Jodene Nam Head Wo Contrast  Result Date: 06/06/2018 CLINICAL DATA:  RIGHT facial and RIGHT upper extremity numbness. Possible slurred speech. History of Bell's palsy, hyperlipidemia. EXAM: MRI HEAD WITHOUT CONTRAST MRA HEAD WITHOUT CONTRAST MRA NECK WITHOUT AND WITH CONTRAST TECHNIQUE: Multiplanar, multiecho pulse sequences of the brain and surrounding structures were obtained without intravenous contrast. Angiographic images of the Circle of Willis were obtained using MRA technique without intravenous contrast. Angiographic images of the neck were obtained using MRA technique without and with intravenous contrast. Carotid stenosis measurements (when applicable) are obtained utilizing NASCET criteria, using the distal internal carotid diameter as the denominator. CONTRAST:  58mL  MULTIHANCE GADOBENATE DIMEGLUMINE 529 MG/ML IV SOLN COMPARISON:  CT HEAD June 06, 2018. FINDINGS: MRI HEAD FINDINGS INTRACRANIAL CONTENTS: No reduced diffusion to suggest acute ischemia or hyperacute demyelination. No susceptibility artifact to suggest hemorrhage. The ventricles and sulci are normal for patient's age. No suspicious parenchymal signal, masses, mass effect. No abnormal extra-axial fluid collections. No extra-axial masses. VASCULAR: Normal major intracranial vascular flow voids present at skull base. SKULL AND UPPER CERVICAL SPINE: No abnormal sellar expansion. No suspicious calvarial bone marrow signal. Craniocervical junction maintained. SINUSES/ORBITS: Mild RIGHT maxillary and LEFT ethmoid mucosal thickening. Mastoid air cells are well aerated.The included ocular globes and orbital contents are non-suspicious. OTHER: Prominent neck lymph nodes, possibly reactive. MRA HEAD FINDINGS ANTERIOR CIRCULATION: Normal flow related enhancement of the included cervical, petrous, cavernous and supraclinoid internal carotid arteries. Patent anterior communicating artery. Patent anterior and middle cerebral arteries,  including distal segments. No large vessel occlusion, flow limiting stenosis, aneurysm. POSTERIOR CIRCULATION: Codominant vertebral arteries. Basilar artery is patent, with normal flow related enhancement of the main branch vessels. Patent posterior cerebral arteries. Patent bilateral posterior communicating arteries present. No large vessel occlusion, flow limiting stenosis,  aneurysm. ANATOMIC VARIANTS: None. Source images and MIP images were reviewed. MRA NECK FINDINGS ANTERIOR CIRCULATION: The common carotid arteries are widely patent bilaterally. The carotid bifurcations are patent bilaterally without hemodynamically significant stenosis by NASCET criteria. No flow limiting stenosis or luminal irregularity. POSTERIOR CIRCULATION: Bilateral vertebral arteries are patent to the vertebrobasilar  junction. No flow limiting stenosis or luminal irregularity. Source images and MIP images were reviewed. IMPRESSION: 1. Normal noncontrast MRI head. 2. Normal noncontrast MRA head. 3. Normal contrast-enhanced MRA neck. Electronically Signed   By: Elon Alas M.D.   On: 06/06/2018 19:33   Mr Angiogram Neck W Or Wo Contrast  Result Date: 06/06/2018 CLINICAL DATA:  RIGHT facial and RIGHT upper extremity numbness. Possible slurred speech. History of Bell's palsy, hyperlipidemia. EXAM: MRI HEAD WITHOUT CONTRAST MRA HEAD WITHOUT CONTRAST MRA NECK WITHOUT AND WITH CONTRAST TECHNIQUE: Multiplanar, multiecho pulse sequences of the brain and surrounding structures were obtained without intravenous contrast. Angiographic images of the Circle of Willis were obtained using MRA technique without intravenous contrast. Angiographic images of the neck were obtained using MRA technique without and with intravenous contrast. Carotid stenosis measurements (when applicable) are obtained utilizing NASCET criteria, using the distal internal carotid diameter as the denominator. CONTRAST:  76mL MULTIHANCE GADOBENATE DIMEGLUMINE 529 MG/ML IV SOLN COMPARISON:  CT HEAD June 06, 2018. FINDINGS: MRI HEAD FINDINGS INTRACRANIAL CONTENTS: No reduced diffusion to suggest acute ischemia or hyperacute demyelination. No susceptibility artifact to suggest hemorrhage. The ventricles and sulci are normal for patient's age. No suspicious parenchymal signal, masses, mass effect. No abnormal extra-axial fluid collections. No extra-axial masses. VASCULAR: Normal major intracranial vascular flow voids present at skull base. SKULL AND UPPER CERVICAL SPINE: No abnormal sellar expansion. No suspicious calvarial bone marrow signal. Craniocervical junction maintained. SINUSES/ORBITS: Mild RIGHT maxillary and LEFT ethmoid mucosal thickening. Mastoid air cells are well aerated.The included ocular globes and orbital contents are non-suspicious. OTHER:  Prominent neck lymph nodes, possibly reactive. MRA HEAD FINDINGS ANTERIOR CIRCULATION: Normal flow related enhancement of the included cervical, petrous, cavernous and supraclinoid internal carotid arteries. Patent anterior communicating artery. Patent anterior and middle cerebral arteries, including distal segments. No large vessel occlusion, flow limiting stenosis, aneurysm. POSTERIOR CIRCULATION: Codominant vertebral arteries. Basilar artery is patent, with normal flow related enhancement of the main branch vessels. Patent posterior cerebral arteries. Patent bilateral posterior communicating arteries present. No large vessel occlusion, flow limiting stenosis,  aneurysm. ANATOMIC VARIANTS: None. Source images and MIP images were reviewed. MRA NECK FINDINGS ANTERIOR CIRCULATION: The common carotid arteries are widely patent bilaterally. The carotid bifurcations are patent bilaterally without hemodynamically significant stenosis by NASCET criteria. No flow limiting stenosis or luminal irregularity. POSTERIOR CIRCULATION: Bilateral vertebral arteries are patent to the vertebrobasilar junction. No flow limiting stenosis or luminal irregularity. Source images and MIP images were reviewed. IMPRESSION: 1. Normal noncontrast MRI head. 2. Normal noncontrast MRA head. 3. Normal contrast-enhanced MRA neck. Electronically Signed   By: Elon Alas M.D.   On: 06/06/2018 19:33   Mr Brain Wo Contrast (neuro Protocol)  Result Date: 06/06/2018 CLINICAL DATA:  RIGHT facial and RIGHT upper extremity numbness. Possible slurred speech. History of Bell's palsy, hyperlipidemia. EXAM: MRI HEAD WITHOUT CONTRAST MRA HEAD WITHOUT CONTRAST MRA NECK  WITHOUT AND WITH CONTRAST TECHNIQUE: Multiplanar, multiecho pulse sequences of the brain and surrounding structures were obtained without intravenous contrast. Angiographic images of the Circle of Willis were obtained using MRA technique without intravenous contrast. Angiographic images  of the neck were obtained using MRA technique without and with intravenous contrast. Carotid stenosis measurements (when applicable) are obtained utilizing NASCET criteria, using the distal internal carotid diameter as the denominator. CONTRAST:  65mL MULTIHANCE GADOBENATE DIMEGLUMINE 529 MG/ML IV SOLN COMPARISON:  CT HEAD June 06, 2018. FINDINGS: MRI HEAD FINDINGS INTRACRANIAL CONTENTS: No reduced diffusion to suggest acute ischemia or hyperacute demyelination. No susceptibility artifact to suggest hemorrhage. The ventricles and sulci are normal for patient's age. No suspicious parenchymal signal, masses, mass effect. No abnormal extra-axial fluid collections. No extra-axial masses. VASCULAR: Normal major intracranial vascular flow voids present at skull base. SKULL AND UPPER CERVICAL SPINE: No abnormal sellar expansion. No suspicious calvarial bone marrow signal. Craniocervical junction maintained. SINUSES/ORBITS: Mild RIGHT maxillary and LEFT ethmoid mucosal thickening. Mastoid air cells are well aerated.The included ocular globes and orbital contents are non-suspicious. OTHER: Prominent neck lymph nodes, possibly reactive. MRA HEAD FINDINGS ANTERIOR CIRCULATION: Normal flow related enhancement of the included cervical, petrous, cavernous and supraclinoid internal carotid arteries. Patent anterior communicating artery. Patent anterior and middle cerebral arteries, including distal segments. No large vessel occlusion, flow limiting stenosis, aneurysm. POSTERIOR CIRCULATION: Codominant vertebral arteries. Basilar artery is patent, with normal flow related enhancement of the main branch vessels. Patent posterior cerebral arteries. Patent bilateral posterior communicating arteries present. No large vessel occlusion, flow limiting stenosis,  aneurysm. ANATOMIC VARIANTS: None. Source images and MIP images were reviewed. MRA NECK FINDINGS ANTERIOR CIRCULATION: The common carotid arteries are widely patent bilaterally.  The carotid bifurcations are patent bilaterally without hemodynamically significant stenosis by NASCET criteria. No flow limiting stenosis or luminal irregularity. POSTERIOR CIRCULATION: Bilateral vertebral arteries are patent to the vertebrobasilar junction. No flow limiting stenosis or luminal irregularity. Source images and MIP images were reviewed. IMPRESSION: 1. Normal noncontrast MRI head. 2. Normal noncontrast MRA head. 3. Normal contrast-enhanced MRA neck. Electronically Signed   By: Elon Alas M.D.   On: 06/06/2018 19:33    ____________________________________________   PROCEDURES  Procedure(s) performed:   Procedures  None ____________________________________________   INITIAL IMPRESSION / ASSESSMENT AND PLAN / ED COURSE  Pertinent labs & imaging results that were available during my care of the patient were reviewed by me and considered in my medical decision making (see chart for details).  Patient presents to the ED with right sided face and RUE numbness. No weakness. Patient with an associated HA. CT head negative. Patient HA improved slightly but subjective numbness remain. No meningeal signs. No concern for neck mass. Patient has some tender lymphadenopathy but will obtain MRI and MRA head/neck to r/o vascular dissection.   08:51 PM  Feels somewhat better after migraine cocktail.  MRI/MRA normal. She does have some cervical adenopathy to account for neck pain. Will refer back to PCP and have placed an ambulatory referral to Neurology.    I have reviewed and discussed all results (EKG, imaging, lab, urine as appropriate), exam findings with patient. I have reviewed nursing notes and appropriate previous records.  I feel the patient is safe to be discharged home without further emergent workup. Discussed usual and customary return precautions. Patient and family (if present) verbalize understanding and are comfortable with this plan.  Patient will follow-up with their  primary care provider. If they do not have a primary  care provider, information for follow-up has been provided to them. All questions have been answered.  ____________________________________________  FINAL CLINICAL IMPRESSION(S) / ED DIAGNOSES  Final diagnoses:  Numbness of face  Right arm numbness  Acute non intractable tension-type headache     MEDICATIONS GIVEN DURING THIS VISIT:  Medications  ketorolac (TORADOL) 30 MG/ML injection 30 mg (30 mg Intravenous Given 06/06/18 1952)  metoCLOPramide (REGLAN) injection 10 mg (10 mg Intravenous Given 06/06/18 1952)  LORazepam (ATIVAN) injection 1 mg (1 mg Intravenous Given 06/06/18 1721)  gadobenate dimeglumine (MULTIHANCE) injection 20 mL (20 mLs Intravenous Contrast Given 06/06/18 1913)     NEW OUTPATIENT MEDICATIONS STARTED DURING THIS VISIT:  Discharge Medication List as of 06/06/2018  9:12 PM    START taking these medications   Details  metoCLOPramide (REGLAN) 10 MG tablet Take 1 tablet (10 mg total) by mouth every 6 (six) hours., Starting Tue 06/06/2018, Print        Note:  This document was prepared using Dragon voice recognition software and may include unintentional dictation errors.  Nanda Quinton, MD Emergency Medicine    Long, [facility], MD 06/07/18 [phone]

## 2018-06-06 NOTE — Discharge Instructions (Addendum)
You were seen in the ED today with numbness in the face and arm. Your MRI is normal. Please call to schedule a follow up appointment with Neurology listed. Return to the ED with any new or worsening symptoms.

## 2018-06-06 NOTE — Progress Notes (Signed)
CSW responded to pt's room. Pt and small child with pt are not in pt's room. CSW will continue to check back on situation.   Wendelyn Breslow, [facility]  [phone]

## 2018-06-06 NOTE — ED Notes (Signed)
Pt was sent here by her PCP for right arm pain, numbness and tingling, right facial numbness, and possible slurred speech.  The facial numbness and possible slurred speech occurred yesterday.  Right arm pain and numbness started today.  Pt sent to ED.

## 2018-06-06 NOTE — ED Notes (Signed)
Pt stable, ambulatory, states understanding of discharge instructions 

## 2018-06-06 NOTE — ED Notes (Signed)
Pt returned from MRI °

## 2018-06-06 NOTE — ED Notes (Signed)
MRI notified this RN that they are unable to watch the small child present with pt. This RN attemtped to contact case management or social work without success. MD made aware.

## 2018-06-06 NOTE — ED Triage Notes (Addendum)
Pt endorses right sided numbness/tingling/weakness since yesterday morning with headache. Pt stated that she went to an auto garage and was told that the right side of her face looked droopy around 1400. LSN 0800 yesterday. Pt has some slurred speech, slight right sided facial droop. No drift but grip is weaker on right. Pt has hx of bell's palsy. VSS. Axox4.

## 2018-06-06 NOTE — ED Notes (Signed)
Ativan admin witnessed by this RN

## 2018-06-07 ENCOUNTER — Encounter: Payer: Self-pay | Admitting: Adult Health

## 2018-06-07 ENCOUNTER — Ambulatory Visit (INDEPENDENT_AMBULATORY_CARE_PROVIDER_SITE_OTHER): Payer: Medicare Other | Admitting: Adult Health

## 2018-06-07 ENCOUNTER — Ambulatory Visit (INDEPENDENT_AMBULATORY_CARE_PROVIDER_SITE_OTHER): Payer: Medicare Other | Admitting: Pulmonary Disease

## 2018-06-07 VITALS — BP 122/76 | HR 77 | Ht 67.0 in | Wt 208.0 lb

## 2018-06-07 DIAGNOSIS — D860 Sarcoidosis of lung: Secondary | ICD-10-CM

## 2018-06-07 DIAGNOSIS — D869 Sarcoidosis, unspecified: Secondary | ICD-10-CM | POA: Diagnosis not present

## 2018-06-07 LAB — PULMONARY FUNCTION TEST
DL/VA % pred: 73 %
DL/VA: 3.8 ml/min/mmHg/L
DLCO COR: 12.26 ml/min/mmHg
DLCO UNC: 11.06 ml/min/mmHg
DLCO cor % pred: 43 %
DLCO unc % pred: 39 %
FEF 25-75 Pre: 1.62 L/sec
FEF2575-%PRED-PRE: 54 %
FEV1-%Pred-Pre: 61 %
FEV1-Pre: 1.72 L
FEV1FVC-%Pred-Pre: 96 %
FEV6-%PRED-PRE: 64 %
FEV6-Pre: 2.16 L
FEV6FVC-%Pred-Pre: 102 %
FVC-%PRED-PRE: 63 %
FVC-PRE: 2.16 L
Pre FEV1/FVC ratio: 80 %
Pre FEV6/FVC Ratio: 100 %

## 2018-06-07 MED ORDER — HYDROCODONE-HOMATROPINE 5-1.5 MG/5ML PO SYRP: 5.0000 mL | ORAL_SOLUTION | Freq: Four times a day (QID) | ORAL | 0 refills | Status: DC | PRN

## 2018-06-07 MED ORDER — PREDNISONE 20 MG PO TABS: 20.0000 mg | ORAL_TABLET | Freq: Every day | ORAL | 5 refills | Status: DC

## 2018-06-07 NOTE — Progress Notes (Signed)
Reviewed and agree with assessment/plan.   Teena Mangus, MD [facility] 12/22/2016, 12:24 PM Pager:  [phone]  

## 2018-06-07 NOTE — Progress Notes (Signed)
Spirometry and Dlco done today. 

## 2018-06-07 NOTE — Progress Notes (Signed)
pr

## 2018-06-07 NOTE — Patient Instructions (Signed)
Continue on Prednisone 20mg  daily.  Low sweet diet  Exercise as tolerated Discuss with Primary MD that you are on chronic steroids as this may affect your bones and blood sugars, weight .  Refer to Rheumatology for Sarcoid  Refer to [facility] center -Pulmonary for Sarcoid  Follow up with Dr. Halford Chessman  In 2 months and  As needed   Please contact office for sooner follow up if symptoms do not improve or worsen or seek emergency care

## 2018-06-07 NOTE — Progress Notes (Signed)
@Patient  ID: Rhonda Hester, female    DOB: March 13, 1976, 42 y.o.   MRN: [id number]  Chief Complaint  Patient presents with  . Follow-up    Sarcoid     Referring provider: Dorena Dew, FNP  HPI: 42 year old female former smoker followed for asthma, allergic rhinitis and sarcoidosis-diagnosed 2006 (pulmonary and skin involvement, Bell's palsy) Disabled due to Sarcoid .   Pulmonary tests: RAST 07/22/11 >>Multiple allergens, IgE 71.8  PFT 09/02/11 >> FEV1 2.80(93%), FEV1% 82, TLC 4.74(89%), DLCO 59%, no BD PFT 09/25/13 >> FEV1 2.75 (95%), FEV1% 81, TLC 4.25 (77%), DLCO 64%, no BD ACE 09/24/16 >> 219 PFT 11/24/16 >> FEV1 2.52 (89%), FEV1% 80, TLC 4.65 (84%), DLCO 56%, no BD ACE 04/25/17 >> 233  CT chests CT chest 12/30/04 >>b/l hilar and mediastinal adenopathy with b/l nodular interstitial opacities  CT chest 07/08/08 >>decreased lymph nodes, scarring with volume loss  CT chest 11/20/13 >>Scattered areas of ATX CT chest 04/02/14 >> calcified Rt hilar LAN, scattered areas of scarring CT chest 10/01/16 >> stable scarring and traction BTX CT chest April 2019>> interval progression of mid to upper lung predominant fibrotic interstitial lung disease compatible with end-stage sarcoidosis, stable chronic mediastinal and bilateral hilar adenopathy splenomegaly  06/07/2018 Follow up : Sarcoid  Patient presents for a 79-month follow-up.  Patient has underlying severe sarcoid. Says overall her breathing has been doing about the same.  Says that she gets short of breath with minimal activity.  Has a daily dry cough.  She says she might be slightly better since last visit.  Patient has been having increased symptoms for the last several months.  With diffuse joint pain, decreased activity tolerance frequent flare of recurrent cough. Last visit patient was started on a  slow prednisone taper and to hold it 20 mg. Pulmonary function test today showed drop in lung function with FEV1 at 61%,  ratio 80, FVC 63%, DLCO 39%.  High-res CT chest April 2019 showed progression of sarcoidosis with fibrotic interstitial lung disease compatible with end-stage sarcoid.  ACE level was very high at 512.. We discussed her test results.  She continues to have tenderness along the neck area feel that she has swollen glands.  Also has rib soreness from recurrent cough.  Request cough medication.  Patient has been following with her primary care physician.  Has been referred to GI /Liver clinic for possible auto immune hepatitis in addition to sarcoid .  Was seen in ER yesterday for slurred speech, facial numbness on right side . MRI head and neck were negative.  Patient says symptoms have resolved.    Allergies  Allergen Reactions  . Acyclovir And Related     Patient states it "made me feel like I can't breath"   . Citric Acid Other (See Comments)    AVOIDS DUE TO INTERSTITIAL CYSTITIS  . Dust Mite Extract Itching and Other (See Comments)     coughing  . Grapeseed Extract [Nutritional Supplements]     Throat itching and burning  . Pineapple   . Pollen Extract Itching and Other (See Comments)     coughing  . Nickel Rash    Immunization History  Administered Date(s) Administered  . Influenza Whole 09/02/2011, 09/26/2012  . Influenza,inj,Quad PF,6+ Mos 12/27/2012, 09/04/2015, 09/20/2017  . Pneumococcal Polysaccharide-23 05/10/2016  . Tdap 05/10/2016    Past Medical History:  Diagnosis Date  . Allergic asthma   . Breast mass 05/2017   lt breast lump  . Eczema   .  GERD (gastroesophageal reflux disease)   . History of anal fissures   . History of Bell's palsy    2006  RIGHT SIDE--  RESOLVED  . History of gastroesophageal reflux (GERD)   . Hyperlipidemia   . Interstitial cystitis   . Sarcoidosis of lung ([place])    DX 2006--  PULMOLOGIST--  DR HYQM  . Seasonal allergic rhinitis   . Shortness of breath     Tobacco History: Social History   Tobacco Use  Smoking Status Former  Smoker  . Packs/day: 0.30  . Years: 6.00  . Pack years: 1.80  . Types: Cigarettes, Cigars  . Last attempt to quit: 11/2016  . Years since quitting: 1.5  Smokeless Tobacco Never Used  Tobacco Comment   ONE CIG. DAILY /  1 PPMONTH   Counseling given: Not Answered Comment: ONE CIG. DAILY /  1 PPMONTH   Outpatient Encounter Medications as of 06/07/2018  Medication Sig  . albuterol (PROAIR HFA) 108 (90 Base) MCG/ACT inhaler Inhale 2 puffs into the lungs every 6 (six) hours as needed for shortness of breath.  . cetirizine (ZYRTEC) 10 MG tablet Take 1 tablet (10 mg total) by mouth daily.  . cyclobenzaprine (FLEXERIL) 5 MG tablet Take 1 tablet (5 mg total) by mouth at bedtime as needed for muscle spasms.  . DULoxetine (CYMBALTA) 30 MG capsule Take 1 capsule (30 mg total) by mouth daily for 7 days, THEN 1 capsule (30 mg total) 2 (two) times daily.  Marland Kitchen gabapentin (NEURONTIN) 300 MG capsule Take 3 capsules (900 mg total) by mouth at bedtime.  . hydrocortisone (ANUSOL-HC) 2.5 % rectal cream Place 1 application rectally 2 (two) times daily.  Marland Kitchen ibuprofen (ADVIL,MOTRIN) 800 MG tablet Take 1 tablet (800 mg total) by mouth every 8 (eight) hours as needed.  . metoCLOPramide (REGLAN) 10 MG tablet Take 1 tablet (10 mg total) by mouth every 6 (six) hours.  . traMADol (ULTRAM) 50 MG tablet Take 1 tablet (50 mg total) by mouth every 6 (six) hours as needed.  . benzonatate (TESSALON) 100 MG capsule Take 1 capsule (100 mg total) by mouth every 8 (eight) hours. (Patient not taking: Reported on 06/07/2018)  . HYDROcodone-homatropine (HYDROMET) 5-1.5 MG/5ML syrup Take 5 mLs by mouth every 6 (six) hours as needed for cough.  . predniSONE (DELTASONE) 20 MG tablet Take 1 tablet (20 mg total) by mouth daily with breakfast.   Facility-Administered Encounter Medications as of 06/07/2018  Medication  . bupivacaine (MARCAINE) 0.5 % 15 mL, phenazopyridine (PYRIDIUM) 400 mg bladder mixture  . leuprolide ([name]) injection  11.25 mg  . leuprolide ([name]) injection 3.75 mg     Review of Systems  Constitutional:   No  weight loss, night sweats,  Fevers, chills, + fatigue, or  lassitude.  HEENT:   No headaches,  Difficulty swallowing,  Tooth/dental problems, or  Sore throat,                No sneezing, itching, ear ache, nasal congestion, post nasal drip,  Tender neck glands   CV:  No chest pain,  Orthopnea, PND, swelling in lower extremities, anasarca, dizziness, palpitations, syncope.   GI  No heartburn, indigestion, abdominal pain,+ nausea, Neg  vomiting, diarrhea, change in bowel habits, loss of appetite, bloody stools.   Resp:  No chest wall deformity  Skin: no rash or lesions.  GU: no dysuria, change in color of urine, no urgency or frequency.  No flank pain, no hematuria   MS:  No  joint pain or swelling.  No decreased range of motion.  No back pain.    Physical Exam  BP 122/76 (BP Location: Left Arm, Cuff Size: Normal)   Pulse 77   Ht 5\' 7"  (1.702 m)   Wt 208 lb (94.3 kg)   SpO2 98%   BMI 32.58 kg/m   GEN: A/Ox3; pleasant , NAD, obese    HEENT:  [place]/AT,  EACs-clear, TMs-wnl, NOSE-clear, THROAT-clear, no lesions, no postnasal drip or exudate noted. Tender cervical glands.   NECK:  Supple w/ fair ROM; no JVD; normal carotid impulses w/o bruits; no thyromegaly or nodules palpated; no lymphadenopathy.    RESP  Clear  P & A; w/o, wheezes/ rales/ or rhonchi. no accessory muscle use, no dullness to percussion  CARD:  RRR, no m/r/g, no peripheral edema, pulses intact, no cyanosis or clubbing.  GI:   Soft & nt; nml bowel sounds; no organomegaly or masses detected.   Musco: Warm bil, no deformities or joint swelling noted.   Neuro: alert, no focal deficits noted.    Skin: Warm, no lesions or rashes    Lab Results:  CBC    Component Value Date/Time   WBC 5.5 06/06/2018 1231   RBC 4.26 06/06/2018 1231   HGB 12.6 06/06/2018 1300   HGB 10.1 (L) 02/09/2018 1122   HCT 37.0  06/06/2018 1300   HCT 33.7 (L) 02/09/2018 1122   PLT 323 06/06/2018 1231   PLT 408 (H) 02/09/2018 1122   MCV 84.3 06/06/2018 1231   MCV 81 02/09/2018 1122   MCH 24.9 (L) 06/06/2018 1231   MCHC 29.5 (L) 06/06/2018 1231   RDW 17.0 (H) 06/06/2018 1231   RDW 17.8 (H) 02/09/2018 1122   LYMPHSABS 1.4 06/06/2018 1231   MONOABS 0.5 06/06/2018 1231   EOSABS 0.7 06/06/2018 1231   BASOSABS 0.1 06/06/2018 1231    BMET    Component Value Date/Time   NA 141 06/06/2018 1300   K 3.9 06/06/2018 1300   CL 103 06/06/2018 1300   CO2 23 06/06/2018 1231   GLUCOSE 166 (H) 06/06/2018 1300   BUN 13 06/06/2018 1300   CREATININE 0.50 06/06/2018 1300   CREATININE 0.65 08/09/2017 1018   CALCIUM 9.7 06/06/2018 1231   GFRNONAA >60 06/06/2018 1231   GFRNONAA >89 08/09/2017 1018   GFRAA >60 06/06/2018 1231   GFRAA >89 08/09/2017 1018    BNP No results found for: BNP  ProBNP    Component Value Date/Time   PROBNP 10.0 03/31/2018 1229    Imaging: Dg Chest 2 View  Result Date: 05/28/2018 CLINICAL DATA:  42 year old female with history of severe body aches, chills, shortness of breath and dry mouth. EXAM: CHEST - 2 VIEW COMPARISON:  Multiple priors, most recently 01/02/2018. FINDINGS: Widespread interstitial prominence and extensive architectural distortion noted throughout the lungs bilaterally, similar to prior examinations, compatible with the patient's reported clinical history of sarcoidosis. No definite acute consolidative airspace disease. No pleural effusions. No evidence of pulmonary edema. Heart size is normal. IMPRESSION: 1. Chronic changes related sarcoidosis redemonstrated. No new acute findings are noted. Electronically Signed   By: Vinnie Langton M.D.   On: 05/28/2018 21:48   Ct Head Wo Contrast  Result Date: 06/06/2018 CLINICAL DATA:  Right facial and right upper extremity numbness EXAM: CT HEAD WITHOUT CONTRAST TECHNIQUE: Contiguous axial images were obtained from the base of the skull  through the vertex without intravenous contrast. COMPARISON:  October 06, 2004 head CT; neck CT July 09, 2014 FINDINGS:  Brain: The ventricles are normal in size and configuration. There is no intracranial mass, hemorrhage, extra-axial fluid collection, or midline shift. Gray-white compartments appear normal. No acute infarct evident. Vascular: There is no hyperdense vessel. No vascular calcification evident. Skull: Bony calvarium appears intact. Sinuses/Orbits: There is opacification in multiple ethmoid air cells. There is mucosal thickening in the superior right maxillary antrum. Frontal sinuses are essentially aplastic. Visualized orbits appear symmetric bilaterally. Other: Mastoid air cells are clear. There are masses seen in each parotid gland with several masses seen on the left, largest measuring 1 x 1 cm. IMPRESSION: 1. No intracranial mass or hemorrhage. Gray-white compartments appear normal. 2. Small masses in the parotid glands, also present previously. Questions small lymph nodes versus small Warthin's tumors. 3. Areas of paranasal sinus disease, primarily in the ethmoid regions. Electronically Signed   By: Lowella Grip III M.D.   On: 06/06/2018 13:35   Ct Angio Chest Pe W And/or Wo Contrast  Result Date: 05/28/2018 CLINICAL DATA:  PE suspected, intermediate prob, positive D-dimer. Shortness of breath. History of sarcoidosis. EXAM: CT ANGIOGRAPHY CHEST WITH CONTRAST TECHNIQUE: Multidetector CT imaging of the chest was performed using the standard protocol during bolus administration of intravenous contrast. Multiplanar CT image reconstructions and MIPs were obtained to evaluate the vascular anatomy. CONTRAST:  140mL ISOVUE-370 IOPAMIDOL (ISOVUE-370) INJECTION 76% COMPARISON:  Chest radiograph earlier this day. High-resolution chest CT 04/07/2018 FINDINGS: Cardiovascular: There are no filling defects within the pulmonary arteries to suggest pulmonary embolus. Thoracic aorta is normal in caliber  without dissection. Heart is normal in size. No pericardial effusion. Mediastinum/Nodes: Again seen bilateral hilar adenopathy. Enlarged right internal mammary node is slightly more prominent currently measuring 15 mm short axis, previously 12 mm. Prominent right lower paratracheal and subcarinal nodes are also unchanged from recent prior. The esophagus is decompressed. No visualized thyroid nodule. Lungs/Pleura: Breathing motion artifact partially obscures evaluation. Lung parenchymal changes of sarcoidosis with bronchiectasis, ground-glass opacities and architectural distortion appears similar to prior exam allowing for breathing motion. No new focal airspace disease. No pleural effusion. Trachea and mainstem bronchi are patent. Upper Abdomen: Splenomegaly is partially included. No acute finding. Musculoskeletal: There are no acute or suspicious osseous abnormalities. Review of the MIP images confirms the above findings. IMPRESSION: 1. No pulmonary embolus. 2. Stable chronic findings of hilar and mediastinal adenopathy as well as pulmonary parenchymal changes consistent with sarcoidosis, grossly stable from recent high-resolution chest CT. No new abnormality. Electronically Signed   By: Jeb Levering M.D.   On: 05/28/2018 23:35   Mr Jodene Nam Head Wo Contrast  Result Date: 06/06/2018 CLINICAL DATA:  RIGHT facial and RIGHT upper extremity numbness. Possible slurred speech. History of Bell's palsy, hyperlipidemia. EXAM: MRI HEAD WITHOUT CONTRAST MRA HEAD WITHOUT CONTRAST MRA NECK WITHOUT AND WITH CONTRAST TECHNIQUE: Multiplanar, multiecho pulse sequences of the brain and surrounding structures were obtained without intravenous contrast. Angiographic images of the Circle of Willis were obtained using MRA technique without intravenous contrast. Angiographic images of the neck were obtained using MRA technique without and with intravenous contrast. Carotid stenosis measurements (when applicable) are obtained utilizing  NASCET criteria, using the distal internal carotid diameter as the denominator. CONTRAST:  21mL MULTIHANCE GADOBENATE DIMEGLUMINE 529 MG/ML IV SOLN COMPARISON:  CT HEAD June 06, 2018. FINDINGS: MRI HEAD FINDINGS INTRACRANIAL CONTENTS: No reduced diffusion to suggest acute ischemia or hyperacute demyelination. No susceptibility artifact to suggest hemorrhage. The ventricles and sulci are normal for patient's age. No suspicious parenchymal signal, masses, mass effect. No  abnormal extra-axial fluid collections. No extra-axial masses. VASCULAR: Normal major intracranial vascular flow voids present at skull base. SKULL AND UPPER CERVICAL SPINE: No abnormal sellar expansion. No suspicious calvarial bone marrow signal. Craniocervical junction maintained. SINUSES/ORBITS: Mild RIGHT maxillary and LEFT ethmoid mucosal thickening. Mastoid air cells are well aerated.The included ocular globes and orbital contents are non-suspicious. OTHER: Prominent neck lymph nodes, possibly reactive. MRA HEAD FINDINGS ANTERIOR CIRCULATION: Normal flow related enhancement of the included cervical, petrous, cavernous and supraclinoid internal carotid arteries. Patent anterior communicating artery. Patent anterior and middle cerebral arteries, including distal segments. No large vessel occlusion, flow limiting stenosis, aneurysm. POSTERIOR CIRCULATION: Codominant vertebral arteries. Basilar artery is patent, with normal flow related enhancement of the main branch vessels. Patent posterior cerebral arteries. Patent bilateral posterior communicating arteries present. No large vessel occlusion, flow limiting stenosis,  aneurysm. ANATOMIC VARIANTS: None. Source images and MIP images were reviewed. MRA NECK FINDINGS ANTERIOR CIRCULATION: The common carotid arteries are widely patent bilaterally. The carotid bifurcations are patent bilaterally without hemodynamically significant stenosis by NASCET criteria. No flow limiting stenosis or luminal  irregularity. POSTERIOR CIRCULATION: Bilateral vertebral arteries are patent to the vertebrobasilar junction. No flow limiting stenosis or luminal irregularity. Source images and MIP images were reviewed. IMPRESSION: 1. Normal noncontrast MRI head. 2. Normal noncontrast MRA head. 3. Normal contrast-enhanced MRA neck. Electronically Signed   By: Elon Alas M.D.   On: 06/06/2018 19:33   Mr Angiogram Neck W Or Wo Contrast  Result Date: 06/06/2018 CLINICAL DATA:  RIGHT facial and RIGHT upper extremity numbness. Possible slurred speech. History of Bell's palsy, hyperlipidemia. EXAM: MRI HEAD WITHOUT CONTRAST MRA HEAD WITHOUT CONTRAST MRA NECK WITHOUT AND WITH CONTRAST TECHNIQUE: Multiplanar, multiecho pulse sequences of the brain and surrounding structures were obtained without intravenous contrast. Angiographic images of the Circle of Willis were obtained using MRA technique without intravenous contrast. Angiographic images of the neck were obtained using MRA technique without and with intravenous contrast. Carotid stenosis measurements (when applicable) are obtained utilizing NASCET criteria, using the distal internal carotid diameter as the denominator. CONTRAST:  50mL MULTIHANCE GADOBENATE DIMEGLUMINE 529 MG/ML IV SOLN COMPARISON:  CT HEAD June 06, 2018. FINDINGS: MRI HEAD FINDINGS INTRACRANIAL CONTENTS: No reduced diffusion to suggest acute ischemia or hyperacute demyelination. No susceptibility artifact to suggest hemorrhage. The ventricles and sulci are normal for patient's age. No suspicious parenchymal signal, masses, mass effect. No abnormal extra-axial fluid collections. No extra-axial masses. VASCULAR: Normal major intracranial vascular flow voids present at skull base. SKULL AND UPPER CERVICAL SPINE: No abnormal sellar expansion. No suspicious calvarial bone marrow signal. Craniocervical junction maintained. SINUSES/ORBITS: Mild RIGHT maxillary and LEFT ethmoid mucosal thickening. Mastoid air  cells are well aerated.The included ocular globes and orbital contents are non-suspicious. OTHER: Prominent neck lymph nodes, possibly reactive. MRA HEAD FINDINGS ANTERIOR CIRCULATION: Normal flow related enhancement of the included cervical, petrous, cavernous and supraclinoid internal carotid arteries. Patent anterior communicating artery. Patent anterior and middle cerebral arteries, including distal segments. No large vessel occlusion, flow limiting stenosis, aneurysm. POSTERIOR CIRCULATION: Codominant vertebral arteries. Basilar artery is patent, with normal flow related enhancement of the main branch vessels. Patent posterior cerebral arteries. Patent bilateral posterior communicating arteries present. No large vessel occlusion, flow limiting stenosis,  aneurysm. ANATOMIC VARIANTS: None. Source images and MIP images were reviewed. MRA NECK FINDINGS ANTERIOR CIRCULATION: The common carotid arteries are widely patent bilaterally. The carotid bifurcations are patent bilaterally without hemodynamically significant stenosis by NASCET criteria. No flow limiting stenosis or luminal irregularity.  POSTERIOR CIRCULATION: Bilateral vertebral arteries are patent to the vertebrobasilar junction. No flow limiting stenosis or luminal irregularity. Source images and MIP images were reviewed. IMPRESSION: 1. Normal noncontrast MRI head. 2. Normal noncontrast MRA head. 3. Normal contrast-enhanced MRA neck. Electronically Signed   By: Elon Alas M.D.   On: 06/06/2018 19:33   Mr Brain Wo Contrast (neuro Protocol)  Result Date: 06/06/2018 CLINICAL DATA:  RIGHT facial and RIGHT upper extremity numbness. Possible slurred speech. History of Bell's palsy, hyperlipidemia. EXAM: MRI HEAD WITHOUT CONTRAST MRA HEAD WITHOUT CONTRAST MRA NECK WITHOUT AND WITH CONTRAST TECHNIQUE: Multiplanar, multiecho pulse sequences of the brain and surrounding structures were obtained without intravenous contrast. Angiographic images of the  Circle of Willis were obtained using MRA technique without intravenous contrast. Angiographic images of the neck were obtained using MRA technique without and with intravenous contrast. Carotid stenosis measurements (when applicable) are obtained utilizing NASCET criteria, using the distal internal carotid diameter as the denominator. CONTRAST:  63mL MULTIHANCE GADOBENATE DIMEGLUMINE 529 MG/ML IV SOLN COMPARISON:  CT HEAD June 06, 2018. FINDINGS: MRI HEAD FINDINGS INTRACRANIAL CONTENTS: No reduced diffusion to suggest acute ischemia or hyperacute demyelination. No susceptibility artifact to suggest hemorrhage. The ventricles and sulci are normal for patient's age. No suspicious parenchymal signal, masses, mass effect. No abnormal extra-axial fluid collections. No extra-axial masses. VASCULAR: Normal major intracranial vascular flow voids present at skull base. SKULL AND UPPER CERVICAL SPINE: No abnormal sellar expansion. No suspicious calvarial bone marrow signal. Craniocervical junction maintained. SINUSES/ORBITS: Mild RIGHT maxillary and LEFT ethmoid mucosal thickening. Mastoid air cells are well aerated.The included ocular globes and orbital contents are non-suspicious. OTHER: Prominent neck lymph nodes, possibly reactive. MRA HEAD FINDINGS ANTERIOR CIRCULATION: Normal flow related enhancement of the included cervical, petrous, cavernous and supraclinoid internal carotid arteries. Patent anterior communicating artery. Patent anterior and middle cerebral arteries, including distal segments. No large vessel occlusion, flow limiting stenosis, aneurysm. POSTERIOR CIRCULATION: Codominant vertebral arteries. Basilar artery is patent, with normal flow related enhancement of the main branch vessels. Patent posterior cerebral arteries. Patent bilateral posterior communicating arteries present. No large vessel occlusion, flow limiting stenosis,  aneurysm. ANATOMIC VARIANTS: None. Source images and MIP images were reviewed.  MRA NECK FINDINGS ANTERIOR CIRCULATION: The common carotid arteries are widely patent bilaterally. The carotid bifurcations are patent bilaterally without hemodynamically significant stenosis by NASCET criteria. No flow limiting stenosis or luminal irregularity. POSTERIOR CIRCULATION: Bilateral vertebral arteries are patent to the vertebrobasilar junction. No flow limiting stenosis or luminal irregularity. Source images and MIP images were reviewed. IMPRESSION: 1. Normal noncontrast MRI head. 2. Normal noncontrast MRA head. 3. Normal contrast-enhanced MRA neck. Electronically Signed   By: Elon Alas M.D.   On: 06/06/2018 19:33     Assessment & Plan:   Sarcoidosis of lung Progressive sarcoidosis with multisystem involvement.  Decline in lung function on PFT and progression on CT chest.  Patient is to continue on prednisone 20 mg.  Will refer to rheumatology for consideration of steroid sparing therapy. With patient's multisystem involvement young age and progression will refer to [facility] pulmonary for evaluation   Plan  Patient Instructions  Continue on Prednisone 20mg  daily.  Low sweet diet  Exercise as tolerated Discuss with Primary MD that you are on chronic steroids as this may affect your bones and blood sugars, weight .  Refer to Rheumatology for Sarcoid  Refer to [facility] center -Pulmonary for Sarcoid  Follow up with Dr. Halford Chessman  In 2 months and  As needed   Please contact office for sooner follow up if symptoms do not improve or worsen or seek emergency care           Rexene Edison, NP 06/07/2018

## 2018-06-07 NOTE — Assessment & Plan Note (Signed)
Progressive sarcoidosis with multisystem involvement.  Decline in lung function on PFT and progression on CT chest.  Patient is to continue on prednisone 20 mg.  Will refer to rheumatology for consideration of steroid sparing therapy. With patient's multisystem involvement young age and progression will refer to [facility] pulmonary for evaluation   Plan  Patient Instructions  Continue on Prednisone 20mg  daily.  Low sweet diet  Exercise as tolerated Discuss with Primary MD that you are on chronic steroids as this may affect your bones and blood sugars, weight .  Refer to Rheumatology for Sarcoid  Refer to [facility] center -Pulmonary for Sarcoid  Follow up with Dr. Halford Chessman  In 2 months and  As needed   Please contact office for sooner follow up if symptoms do not improve or worsen or seek emergency care

## 2018-06-12 ENCOUNTER — Other Ambulatory Visit: Payer: Self-pay | Admitting: Radiology

## 2018-06-14 ENCOUNTER — Ambulatory Visit (HOSPITAL_COMMUNITY)
Admission: RE | Admit: 2018-06-14 | Discharge: 2018-06-14 | Disposition: A | Discharge status: Home / Self Care | Payer: Medicare Other | Source: Ambulatory Visit | Attending: Nurse Practitioner | Admitting: Nurse Practitioner

## 2018-06-14 ENCOUNTER — Encounter (HOSPITAL_COMMUNITY): Payer: Self-pay

## 2018-06-14 ENCOUNTER — Other Ambulatory Visit (HOSPITAL_COMMUNITY): Payer: Self-pay | Admitting: Nurse Practitioner

## 2018-06-14 DIAGNOSIS — E7889 Other lipoprotein metabolism disorders: Secondary | ICD-10-CM

## 2018-06-14 DIAGNOSIS — K760 Fatty (change of) liver, not elsewhere classified: Secondary | ICD-10-CM | POA: Diagnosis present

## 2018-06-14 DIAGNOSIS — D869 Sarcoidosis, unspecified: Secondary | ICD-10-CM

## 2018-06-14 DIAGNOSIS — E785 Hyperlipidemia, unspecified: Secondary | ICD-10-CM | POA: Insufficient documentation

## 2018-06-14 DIAGNOSIS — Z87891 Personal history of nicotine dependence: Secondary | ICD-10-CM | POA: Insufficient documentation

## 2018-06-14 DIAGNOSIS — N3010 Interstitial cystitis (chronic) without hematuria: Secondary | ICD-10-CM | POA: Insufficient documentation

## 2018-06-14 DIAGNOSIS — R748 Abnormal levels of other serum enzymes: Secondary | ICD-10-CM

## 2018-06-14 DIAGNOSIS — K219 Gastro-esophageal reflux disease without esophagitis: Secondary | ICD-10-CM | POA: Insufficient documentation

## 2018-06-14 DIAGNOSIS — R7989 Other specified abnormal findings of blood chemistry: Secondary | ICD-10-CM | POA: Insufficient documentation

## 2018-06-14 DIAGNOSIS — R161 Splenomegaly, not elsewhere classified: Secondary | ICD-10-CM | POA: Insufficient documentation

## 2018-06-14 DIAGNOSIS — E8889 Other specified metabolic disorders: Secondary | ICD-10-CM

## 2018-06-14 DIAGNOSIS — Z7952 Long term (current) use of systemic steroids: Secondary | ICD-10-CM | POA: Insufficient documentation

## 2018-06-14 HISTORY — PX: IR VENOGRAM HEPATIC W HEMODYNAMIC EVALUATION: IMG692

## 2018-06-14 HISTORY — PX: IR US GUIDE VASC ACCESS RIGHT: IMG2390

## 2018-06-14 HISTORY — PX: IR TRANSCATHETER BX: IMG713

## 2018-06-14 LAB — CBC WITH DIFFERENTIAL/PLATELET
BASOS ABS: 0.1 10*3/uL (ref 0.0–0.1)
Basophils Relative: 1 %
EOS PCT: 8 %
Eosinophils Absolute: 0.6 10*3/uL (ref 0.0–0.7)
HEMATOCRIT: 38.5 % (ref 36.0–46.0)
Hemoglobin: 11.9 g/dL — ABNORMAL LOW (ref 12.0–15.0)
Lymphocytes Relative: 24 %
Lymphs Abs: 1.8 10*3/uL (ref 0.7–4.0)
MCH: 25.6 pg — ABNORMAL LOW (ref 26.0–34.0)
MCHC: 30.9 g/dL (ref 30.0–36.0)
MCV: 82.8 fL (ref 78.0–100.0)
MONO ABS: 0.5 10*3/uL (ref 0.1–1.0)
Monocytes Relative: 6 %
NEUTROS ABS: 4.6 10*3/uL (ref 1.7–7.7)
Neutrophils Relative %: 61 %
PLATELETS: 348 10*3/uL (ref 150–400)
RBC: 4.65 MIL/uL (ref 3.87–5.11)
RDW: 16.5 % — AB (ref 11.5–15.5)
WBC: 7.6 10*3/uL (ref 4.0–10.5)

## 2018-06-14 LAB — COMPREHENSIVE METABOLIC PANEL
ALT: 56 U/L — ABNORMAL HIGH (ref 14–54)
AST: 43 U/L — AB (ref 15–41)
Albumin: 3.5 g/dL (ref 3.5–5.0)
Alkaline Phosphatase: 255 U/L — ABNORMAL HIGH (ref 38–126)
Anion gap: 10 (ref 5–15)
BUN: 10 mg/dL (ref 6–20)
CHLORIDE: 105 mmol/L (ref 101–111)
CO2: 25 mmol/L (ref 22–32)
Calcium: 10 mg/dL (ref 8.9–10.3)
Creatinine, Ser: 0.65 mg/dL (ref 0.44–1.00)
GFR calc Af Amer: >60 mL/min (ref >60)
GFR calc non Af Amer: >60 mL/min (ref >60)
GLUCOSE: 101 mg/dL — AB (ref 65–99)
POTASSIUM: 3.9 mmol/L (ref 3.5–5.1)
Sodium: 140 mmol/L (ref 135–145)
Total Bilirubin: 1 mg/dL (ref 0.3–1.2)
Total Protein: 9.5 g/dL — ABNORMAL HIGH (ref 6.5–8.1)

## 2018-06-14 LAB — PROTIME-INR
INR: 0.97
Prothrombin Time: 12.7 seconds (ref 11.4–15.2)

## 2018-06-14 MED ORDER — IOPAMIDOL (ISOVUE-300) INJECTION 61%
50.0000 mL | Freq: Once | INTRAVENOUS | Status: AC | PRN
  Administered 2018-06-14: 15 mL via INTRAVENOUS

## 2018-06-14 MED ORDER — SODIUM CHLORIDE 0.9 % IV SOLN
INTRAVENOUS | Status: DC
  Administered 2018-06-14: 10:00:00 via INTRAVENOUS

## 2018-06-14 MED ORDER — IOPAMIDOL (ISOVUE-300) INJECTION 61%
INTRAVENOUS | Status: AC
  Administered 2018-06-14: 15 mL via INTRAVENOUS
  Filled 2018-06-14: qty 50

## 2018-06-14 MED ORDER — ONDANSETRON HCL 4 MG/2ML IJ SOLN
4.0000 mg | Freq: Once | INTRAMUSCULAR | Status: AC
  Administered 2018-06-14: 4 mg via INTRAVENOUS
  Filled 2018-06-14: qty 2

## 2018-06-14 MED ORDER — MIDAZOLAM HCL 2 MG/2ML IJ SOLN
INTRAMUSCULAR | Status: AC | PRN
  Administered 2018-06-14: 1 mg via INTRAVENOUS
  Administered 2018-06-14 (×2): 0.5 mg via INTRAVENOUS
  Administered 2018-06-14: 1 mg via INTRAVENOUS
  Administered 2018-06-14 (×2): 0.5 mg via INTRAVENOUS

## 2018-06-14 MED ORDER — NALOXONE HCL 0.4 MG/ML IJ SOLN
INTRAMUSCULAR | Status: AC
  Filled 2018-06-14: qty 1

## 2018-06-14 MED ORDER — FLUMAZENIL 0.5 MG/5ML IV SOLN
INTRAVENOUS | Status: AC
  Filled 2018-06-14: qty 5

## 2018-06-14 MED ORDER — FENTANYL CITRATE (PF) 100 MCG/2ML IJ SOLN
INTRAMUSCULAR | Status: AC
  Filled 2018-06-14: qty 4

## 2018-06-14 MED ORDER — LIDOCAINE HCL 1 % IJ SOLN
INTRAMUSCULAR | Status: AC
  Filled 2018-06-14: qty 20

## 2018-06-14 MED ORDER — MIDAZOLAM HCL 2 MG/2ML IJ SOLN
INTRAMUSCULAR | Status: AC
  Filled 2018-06-14: qty 4

## 2018-06-14 MED ORDER — FENTANYL CITRATE (PF) 100 MCG/2ML IJ SOLN
INTRAMUSCULAR | Status: AC | PRN
  Administered 2018-06-14 (×4): 25 ug via INTRAVENOUS
  Administered 2018-06-14 (×2): 50 ug via INTRAVENOUS

## 2018-06-14 NOTE — Procedures (Signed)
Interventional Radiology Procedure Note  Procedure: Transjugular liver bx with pressure measurement. 20g biopsies, with pressure measure  Findings: Calculated portal pressure is 35mmHg, within normal.  .  Complications: None  Recommendations:  - routine wound care at the right neck - 1 hr dc home - advance diet - Do not submerge for 7 days - follow up pathology   Signed,  Dulcy Fanny. Earleen Newport, DO

## 2018-06-14 NOTE — Consult Note (Signed)
Chief Complaint: Patient was seen in consultation today for transjugular liver biopsy with hepatic/portal venous pressure measurements  Referring Physician(s): Drazek,Dawn  Supervising Physician: Corrie Mckusick  Patient Status: [facility] - Out-pt  History of Present Illness: Rhonda Hester is a 42 y.o. female with history of GERD, hyperlipidemia, interstitial cystitis, sarcoidosis, elevated liver function tests, elevated anti-smooth muscle antibody, fatty liver and splenomegaly by ultrasound who presents today for transjugular liver biopsy with hepatic/portal vein pressure measurements for further evaluation.  Past Medical History:  Diagnosis Date  . Allergic asthma   . Breast mass 05/2017   lt breast lump  . Eczema   . GERD (gastroesophageal reflux disease)   . History of anal fissures   . History of Bell's palsy    2006  RIGHT SIDE--  RESOLVED  . History of gastroesophageal reflux (GERD)   . Hyperlipidemia   . Interstitial cystitis   . Sarcoidosis of lung ([place])    DX 2006--  PULMOLOGIST--  DR OZDG  . Seasonal allergic rhinitis   . Shortness of breath     Past Surgical History:  Procedure Laterality Date  . CYSTO WITH HYDRODISTENSION N/A 08/24/2013   Procedure: CYSTOSCOPY/HYDRODISTENSION with instillation of marcaine and pyridium;  Surgeon: Fredricka Bonine, MD;  Location: [facility];  Service: Urology;  Laterality: N/A;  . CYSTOSCOPY/ HYDRODISTENTION/ BLADDER BX  06-09-2010  . DILATION AND CURETTAGE OF UTERUS  1997    Allergies: Acyclovir and related; Citric acid; Dust mite extract; Grapeseed extract [nutritional supplements]; Pineapple; Pollen extract; and Nickel  Medications: Prior to Admission medications   Medication Sig Start Date End Date Taking? Authorizing Provider  cetirizine (ZYRTEC) 10 MG tablet Take 1 tablet (10 mg total) by mouth daily. 03/17/18  Yes Dorena Dew, FNP  HYDROcodone-homatropine (HYDROMET) 5-1.5 MG/5ML syrup Take  5 mLs by mouth every 6 (six) hours as needed for cough. 06/07/18  Yes Parrett, Tammy S, NP  metoCLOPramide (REGLAN) 10 MG tablet Take 1 tablet (10 mg total) by mouth every 6 (six) hours. 06/06/18  Yes Long, Wonda Olds, MD  predniSONE (DELTASONE) 20 MG tablet Take 1 tablet (20 mg total) by mouth daily with breakfast. 06/07/18  Yes Parrett, Tammy S, NP  albuterol (PROAIR HFA) 108 (90 Base) MCG/ACT inhaler Inhale 2 puffs into the lungs every 6 (six) hours as needed for shortness of breath. 05/10/18   Dorena Dew, FNP  benzonatate (TESSALON) 100 MG capsule Take 1 capsule (100 mg total) by mouth every 8 (eight) hours. Patient not taking: Reported on 06/07/2018 05/28/18   Deno Etienne, DO  cyclobenzaprine (FLEXERIL) 5 MG tablet Take 1 tablet (5 mg total) by mouth at bedtime as needed for muscle spasms. 05/07/18   Wurst, [place], PA-C  DULoxetine (CYMBALTA) 30 MG capsule Take 1 capsule (30 mg total) by mouth daily for 7 days, THEN 1 capsule (30 mg total) 2 (two) times daily. 03/28/18 06/26/18  Aundra Dubin, MD  gabapentin (NEURONTIN) 300 MG capsule Take 3 capsules (900 mg total) by mouth at bedtime. 03/28/18   Eksir, Richard Miu, MD  hydrocortisone (ANUSOL-HC) 2.5 % rectal cream Place 1 application rectally 2 (two) times daily. 04/25/18   Levin Erp, PA  ibuprofen (ADVIL,MOTRIN) 800 MG tablet Take 1 tablet (800 mg total) by mouth every 8 (eight) hours as needed. 06/06/18   Long, Wonda Olds, MD  traMADol (ULTRAM) 50 MG tablet Take 1 tablet (50 mg total) by mouth every 6 (six) hours as needed. 04/25/18   Lemmon,  Lavone Nian, PA     Family History  Problem Relation Age of Onset  . Brain cancer Maternal Grandmother   . Hyperlipidemia Other   . Sleep apnea Other   . Depression Maternal Aunt   . Seizures Maternal Uncle     Social History   Socioeconomic History  . Marital status: Single    Spouse name: Not on file  . Number of children: 0  . Years of education: Not on file  . Highest  education level: Not on file  Occupational History  . Occupation: disabled  Social Needs  . Financial resource strain: Not on file  . Food insecurity:    Worry: Not on file    Inability: Not on file  . Transportation needs:    Medical: Not on file    Non-medical: Not on file  Tobacco Use  . Smoking status: Former Smoker    Packs/day: 0.30    Years: 6.00    Pack years: 1.80    Types: Cigarettes, Cigars    Last attempt to quit: 11/2016    Years since quitting: 1.5  . Smokeless tobacco: Never Used  . Tobacco comment: ONE CIG. DAILY /  1 PPMONTH  Substance and Sexual Activity  . Alcohol use: No    Alcohol/week: 0.0 oz  . Drug use: No    Comment: HX MARIJUANA USE  . Sexual activity: Never    Birth control/protection: None    Comment: abstinence   Lifestyle  . Physical activity:    Days per week: Not on file    Minutes per session: Not on file  . Stress: Not on file  Relationships  . Social connections:    Talks on phone: Not on file    Gets together: Not on file    Attends religious service: Not on file    Active member of club or organization: Not on file    Attends meetings of clubs or organizations: Not on file    Relationship status: Not on file  Other Topics Concern  . Not on file  Social History Narrative  . Not on file     Review of Systems denies fever, chest pain, dyspnea, cough, abdominal pain, back pain, vomiting or abnormal bleeding.  She does have some occasional headaches, nausea.  She is anxious.  Vital Signs: BP 126/84   Pulse (!) 109 Comment: Pt states she is nervous.   Temp 98.1 F (36.7 C) (Oral)   Resp 18   SpO2 93%   Physical Exam awake, alert.  Chest clear to auscultation bilaterally.  Heart with slightly tachycardic but regular rhythm.  Abdomen soft, positive bowel sounds, nontender.  No lower extremity edema.  Imaging: Dg Chest 2 View  Result Date: 05/28/2018 CLINICAL DATA:  42 year old female with history of severe body aches, chills,  shortness of breath and dry mouth. EXAM: CHEST - 2 VIEW COMPARISON:  Multiple priors, most recently 01/02/2018. FINDINGS: Widespread interstitial prominence and extensive architectural distortion noted throughout the lungs bilaterally, similar to prior examinations, compatible with the patient's reported clinical history of sarcoidosis. No definite acute consolidative airspace disease. No pleural effusions. No evidence of pulmonary edema. Heart size is normal. IMPRESSION: 1. Chronic changes related sarcoidosis redemonstrated. No new acute findings are noted. Electronically Signed   By: Vinnie Langton M.D.   On: 05/28/2018 21:48   Ct Head Wo Contrast  Result Date: 06/06/2018 CLINICAL DATA:  Right facial and right upper extremity numbness EXAM: CT HEAD WITHOUT CONTRAST TECHNIQUE: Contiguous  axial images were obtained from the base of the skull through the vertex without intravenous contrast. COMPARISON:  October 06, 2004 head CT; neck CT July 09, 2014 FINDINGS: Brain: The ventricles are normal in size and configuration. There is no intracranial mass, hemorrhage, extra-axial fluid collection, or midline shift. Gray-white compartments appear normal. No acute infarct evident. Vascular: There is no hyperdense vessel. No vascular calcification evident. Skull: Bony calvarium appears intact. Sinuses/Orbits: There is opacification in multiple ethmoid air cells. There is mucosal thickening in the superior right maxillary antrum. Frontal sinuses are essentially aplastic. Visualized orbits appear symmetric bilaterally. Other: Mastoid air cells are clear. There are masses seen in each parotid gland with several masses seen on the left, largest measuring 1 x 1 cm. IMPRESSION: 1. No intracranial mass or hemorrhage. Gray-white compartments appear normal. 2. Small masses in the parotid glands, also present previously. Questions small lymph nodes versus small Warthin's tumors. 3. Areas of paranasal sinus disease, primarily in  the ethmoid regions. Electronically Signed   By: Lowella Grip III M.D.   On: 06/06/2018 13:35   Ct Angio Chest Pe W And/or Wo Contrast  Result Date: 05/28/2018 CLINICAL DATA:  PE suspected, intermediate prob, positive D-dimer. Shortness of breath. History of sarcoidosis. EXAM: CT ANGIOGRAPHY CHEST WITH CONTRAST TECHNIQUE: Multidetector CT imaging of the chest was performed using the standard protocol during bolus administration of intravenous contrast. Multiplanar CT image reconstructions and MIPs were obtained to evaluate the vascular anatomy. CONTRAST:  166mL ISOVUE-370 IOPAMIDOL (ISOVUE-370) INJECTION 76% COMPARISON:  Chest radiograph earlier this day. High-resolution chest CT 04/07/2018 FINDINGS: Cardiovascular: There are no filling defects within the pulmonary arteries to suggest pulmonary embolus. Thoracic aorta is normal in caliber without dissection. Heart is normal in size. No pericardial effusion. Mediastinum/Nodes: Again seen bilateral hilar adenopathy. Enlarged right internal mammary node is slightly more prominent currently measuring 15 mm short axis, previously 12 mm. Prominent right lower paratracheal and subcarinal nodes are also unchanged from recent prior. The esophagus is decompressed. No visualized thyroid nodule. Lungs/Pleura: Breathing motion artifact partially obscures evaluation. Lung parenchymal changes of sarcoidosis with bronchiectasis, ground-glass opacities and architectural distortion appears similar to prior exam allowing for breathing motion. No new focal airspace disease. No pleural effusion. Trachea and mainstem bronchi are patent. Upper Abdomen: Splenomegaly is partially included. No acute finding. Musculoskeletal: There are no acute or suspicious osseous abnormalities. Review of the MIP images confirms the above findings. IMPRESSION: 1. No pulmonary embolus. 2. Stable chronic findings of hilar and mediastinal adenopathy as well as pulmonary parenchymal changes consistent  with sarcoidosis, grossly stable from recent high-resolution chest CT. No new abnormality. Electronically Signed   By: Jeb Levering M.D.   On: 05/28/2018 23:35   Mr Jodene Nam Head Wo Contrast  Result Date: 06/06/2018 CLINICAL DATA:  RIGHT facial and RIGHT upper extremity numbness. Possible slurred speech. History of Bell's palsy, hyperlipidemia. EXAM: MRI HEAD WITHOUT CONTRAST MRA HEAD WITHOUT CONTRAST MRA NECK WITHOUT AND WITH CONTRAST TECHNIQUE: Multiplanar, multiecho pulse sequences of the brain and surrounding structures were obtained without intravenous contrast. Angiographic images of the Circle of Willis were obtained using MRA technique without intravenous contrast. Angiographic images of the neck were obtained using MRA technique without and with intravenous contrast. Carotid stenosis measurements (when applicable) are obtained utilizing NASCET criteria, using the distal internal carotid diameter as the denominator. CONTRAST:  49mL MULTIHANCE GADOBENATE DIMEGLUMINE 529 MG/ML IV SOLN COMPARISON:  CT HEAD June 06, 2018. FINDINGS: MRI HEAD FINDINGS INTRACRANIAL CONTENTS: No reduced diffusion to  suggest acute ischemia or hyperacute demyelination. No susceptibility artifact to suggest hemorrhage. The ventricles and sulci are normal for patient's age. No suspicious parenchymal signal, masses, mass effect. No abnormal extra-axial fluid collections. No extra-axial masses. VASCULAR: Normal major intracranial vascular flow voids present at skull base. SKULL AND UPPER CERVICAL SPINE: No abnormal sellar expansion. No suspicious calvarial bone marrow signal. Craniocervical junction maintained. SINUSES/ORBITS: Mild RIGHT maxillary and LEFT ethmoid mucosal thickening. Mastoid air cells are well aerated.The included ocular globes and orbital contents are non-suspicious. OTHER: Prominent neck lymph nodes, possibly reactive. MRA HEAD FINDINGS ANTERIOR CIRCULATION: Normal flow related enhancement of the included cervical,  petrous, cavernous and supraclinoid internal carotid arteries. Patent anterior communicating artery. Patent anterior and middle cerebral arteries, including distal segments. No large vessel occlusion, flow limiting stenosis, aneurysm. POSTERIOR CIRCULATION: Codominant vertebral arteries. Basilar artery is patent, with normal flow related enhancement of the main branch vessels. Patent posterior cerebral arteries. Patent bilateral posterior communicating arteries present. No large vessel occlusion, flow limiting stenosis,  aneurysm. ANATOMIC VARIANTS: None. Source images and MIP images were reviewed. MRA NECK FINDINGS ANTERIOR CIRCULATION: The common carotid arteries are widely patent bilaterally. The carotid bifurcations are patent bilaterally without hemodynamically significant stenosis by NASCET criteria. No flow limiting stenosis or luminal irregularity. POSTERIOR CIRCULATION: Bilateral vertebral arteries are patent to the vertebrobasilar junction. No flow limiting stenosis or luminal irregularity. Source images and MIP images were reviewed. IMPRESSION: 1. Normal noncontrast MRI head. 2. Normal noncontrast MRA head. 3. Normal contrast-enhanced MRA neck. Electronically Signed   By: Elon Alas M.D.   On: 06/06/2018 19:33   Mr Angiogram Neck W Or Wo Contrast  Result Date: 06/06/2018 CLINICAL DATA:  RIGHT facial and RIGHT upper extremity numbness. Possible slurred speech. History of Bell's palsy, hyperlipidemia. EXAM: MRI HEAD WITHOUT CONTRAST MRA HEAD WITHOUT CONTRAST MRA NECK WITHOUT AND WITH CONTRAST TECHNIQUE: Multiplanar, multiecho pulse sequences of the brain and surrounding structures were obtained without intravenous contrast. Angiographic images of the Circle of Willis were obtained using MRA technique without intravenous contrast. Angiographic images of the neck were obtained using MRA technique without and with intravenous contrast. Carotid stenosis measurements (when applicable) are obtained  utilizing NASCET criteria, using the distal internal carotid diameter as the denominator. CONTRAST:  41mL MULTIHANCE GADOBENATE DIMEGLUMINE 529 MG/ML IV SOLN COMPARISON:  CT HEAD June 06, 2018. FINDINGS: MRI HEAD FINDINGS INTRACRANIAL CONTENTS: No reduced diffusion to suggest acute ischemia or hyperacute demyelination. No susceptibility artifact to suggest hemorrhage. The ventricles and sulci are normal for patient's age. No suspicious parenchymal signal, masses, mass effect. No abnormal extra-axial fluid collections. No extra-axial masses. VASCULAR: Normal major intracranial vascular flow voids present at skull base. SKULL AND UPPER CERVICAL SPINE: No abnormal sellar expansion. No suspicious calvarial bone marrow signal. Craniocervical junction maintained. SINUSES/ORBITS: Mild RIGHT maxillary and LEFT ethmoid mucosal thickening. Mastoid air cells are well aerated.The included ocular globes and orbital contents are non-suspicious. OTHER: Prominent neck lymph nodes, possibly reactive. MRA HEAD FINDINGS ANTERIOR CIRCULATION: Normal flow related enhancement of the included cervical, petrous, cavernous and supraclinoid internal carotid arteries. Patent anterior communicating artery. Patent anterior and middle cerebral arteries, including distal segments. No large vessel occlusion, flow limiting stenosis, aneurysm. POSTERIOR CIRCULATION: Codominant vertebral arteries. Basilar artery is patent, with normal flow related enhancement of the main branch vessels. Patent posterior cerebral arteries. Patent bilateral posterior communicating arteries present. No large vessel occlusion, flow limiting stenosis,  aneurysm. ANATOMIC VARIANTS: None. Source images and MIP images were reviewed. MRA NECK FINDINGS ANTERIOR  CIRCULATION: The common carotid arteries are widely patent bilaterally. The carotid bifurcations are patent bilaterally without hemodynamically significant stenosis by NASCET criteria. No flow limiting stenosis or  luminal irregularity. POSTERIOR CIRCULATION: Bilateral vertebral arteries are patent to the vertebrobasilar junction. No flow limiting stenosis or luminal irregularity. Source images and MIP images were reviewed. IMPRESSION: 1. Normal noncontrast MRI head. 2. Normal noncontrast MRA head. 3. Normal contrast-enhanced MRA neck. Electronically Signed   By: Elon Alas M.D.   On: 06/06/2018 19:33   Mr Brain Wo Contrast (neuro Protocol)  Result Date: 06/06/2018 CLINICAL DATA:  RIGHT facial and RIGHT upper extremity numbness. Possible slurred speech. History of Bell's palsy, hyperlipidemia. EXAM: MRI HEAD WITHOUT CONTRAST MRA HEAD WITHOUT CONTRAST MRA NECK WITHOUT AND WITH CONTRAST TECHNIQUE: Multiplanar, multiecho pulse sequences of the brain and surrounding structures were obtained without intravenous contrast. Angiographic images of the Circle of Willis were obtained using MRA technique without intravenous contrast. Angiographic images of the neck were obtained using MRA technique without and with intravenous contrast. Carotid stenosis measurements (when applicable) are obtained utilizing NASCET criteria, using the distal internal carotid diameter as the denominator. CONTRAST:  71mL MULTIHANCE GADOBENATE DIMEGLUMINE 529 MG/ML IV SOLN COMPARISON:  CT HEAD June 06, 2018. FINDINGS: MRI HEAD FINDINGS INTRACRANIAL CONTENTS: No reduced diffusion to suggest acute ischemia or hyperacute demyelination. No susceptibility artifact to suggest hemorrhage. The ventricles and sulci are normal for patient's age. No suspicious parenchymal signal, masses, mass effect. No abnormal extra-axial fluid collections. No extra-axial masses. VASCULAR: Normal major intracranial vascular flow voids present at skull base. SKULL AND UPPER CERVICAL SPINE: No abnormal sellar expansion. No suspicious calvarial bone marrow signal. Craniocervical junction maintained. SINUSES/ORBITS: Mild RIGHT maxillary and LEFT ethmoid mucosal thickening.  Mastoid air cells are well aerated.The included ocular globes and orbital contents are non-suspicious. OTHER: Prominent neck lymph nodes, possibly reactive. MRA HEAD FINDINGS ANTERIOR CIRCULATION: Normal flow related enhancement of the included cervical, petrous, cavernous and supraclinoid internal carotid arteries. Patent anterior communicating artery. Patent anterior and middle cerebral arteries, including distal segments. No large vessel occlusion, flow limiting stenosis, aneurysm. POSTERIOR CIRCULATION: Codominant vertebral arteries. Basilar artery is patent, with normal flow related enhancement of the main branch vessels. Patent posterior cerebral arteries. Patent bilateral posterior communicating arteries present. No large vessel occlusion, flow limiting stenosis,  aneurysm. ANATOMIC VARIANTS: None. Source images and MIP images were reviewed. MRA NECK FINDINGS ANTERIOR CIRCULATION: The common carotid arteries are widely patent bilaterally. The carotid bifurcations are patent bilaterally without hemodynamically significant stenosis by NASCET criteria. No flow limiting stenosis or luminal irregularity. POSTERIOR CIRCULATION: Bilateral vertebral arteries are patent to the vertebrobasilar junction. No flow limiting stenosis or luminal irregularity. Source images and MIP images were reviewed. IMPRESSION: 1. Normal noncontrast MRI head. 2. Normal noncontrast MRA head. 3. Normal contrast-enhanced MRA neck. Electronically Signed   By: Elon Alas M.D.   On: 06/06/2018 19:33    Labs:  CBC: Recent Labs    02/09/18 1122  05/28/18 2111 06/06/18 1231 06/06/18 1300 06/14/18 1012  WBC 8.2  --  6.2 5.5  --  7.6  HGB 10.1*   < > 9.9* 10.6* 12.6 11.9*  HCT 33.7*   < > 32.9* 35.9* 37.0 38.5  PLT 408*  --  337 323  --  348   < > = values in this interval not displayed.    COAGS: Recent Labs    06/06/18 1231  INR 1.08  APTT 36    BMP: Recent Labs  08/09/17 1018  03/31/18 1229 04/25/18 1106  05/07/18 1506 05/28/18 2111 06/06/18 1231 06/06/18 1300  NA 140  --  135 137 140 134* 140 141  K 4.1  --  4.1 3.8 3.6 3.3* 3.3* 3.9  CL 104  --  99 100 102 102 105 103  CO2 21  --  27 28  --  24 23  --   GLUCOSE 84  --  77 92 118* 107* 161* 166*  BUN 9  --  8 11 8 11 10 13   CALCIUM 8.9  --  10.0 9.5  --  9.1 9.7  --   CREATININE 0.65   < > 0.67 0.61 0.70 0.68 0.65 0.50  GFRNONAA >89  --   --   --   --  >60 >60  --   GFRAA >89  --   --   --   --  >60 >60  --    < > = values in this interval not displayed.    LIVER FUNCTION TESTS: Recent Labs    03/31/18 1229 04/25/18 1106 05/28/18 2111 06/06/18 1231  BILITOT 1.2 1.2 1.0 1.2  AST 35 33 39 69*  ALT 42* 50* 46 66*  ALKPHOS 399* 193* 204* 213*  PROT 9.2* 8.2 8.4* 8.7*  ALBUMIN 3.6 3.7 2.9* 3.1*    TUMOR MARKERS: No results for input(s): AFPTM, CEA, CA199, CHROMGRNA in the last 8760 hours.  Assessment and Plan: 42 y.o. female with history of GERD, hyperlipidemia, interstitial cystitis, sarcoidosis, elevated liver function tests, elevated anti-smooth muscle antibody, fatty liver and splenomegaly by ultrasound who presents today for transjugular liver biopsy with hepatic/portal vein pressure measurements for further evaluation.Risks and benefits discussed with the patient including, but not limited to bleeding, infection, damage to adjacent structures or low yield requiring additional tests.  All of the patient's questions were answered, patient is agreeable to proceed. Consent signed and in chart.  LABS PENDING    Thank you for this interesting consult.  I greatly enjoyed meeting Rhonda Hester and look forward to participating in their care.  A copy of this report was sent to the requesting provider on this date.  Electronically Signed: D. Rowe Robert, PA-C 06/14/2018, 10:29 AM   I spent a total of 25 minutes    in face to face in clinical consultation, greater than 50% of which was counseling/coordinating care for  transjugular liver biopsy with hepatic/portal venous pressure measurements

## 2018-06-14 NOTE — Discharge Instructions (Signed)
Moderate Conscious Sedation, Adult, Care After These instructions provide you with information about caring for yourself after your procedure. Your health care provider may also give you more specific instructions. Your treatment has been planned according to current medical practices, but problems sometimes occur. Call your health care provider if you have any problems or questions after your procedure. What can I expect after the procedure? After your procedure, it is common:  To feel sleepy for several hours.  To feel clumsy and have poor balance for several hours.  To have poor judgment for several hours.  To vomit if you eat too soon.  Follow these instructions at home: For at least 24 hours after the procedure:   Do not: ? Participate in activities where you could fall or become injured. ? Drive. ? Use heavy machinery. ? Drink alcohol. ? Take sleeping pills or medicines that cause drowsiness. ? Make important decisions or sign legal documents. ? Take care of children on your own.  Rest. Eating and drinking  Follow the diet recommended by your health care provider.  If you vomit: ? Drink water, juice, or soup when you can drink without vomiting. ? Make sure you have little or no nausea before eating solid foods. General instructions  Have a responsible adult stay with you until you are awake and alert.  Take over-the-counter and prescription medicines only as told by your health care provider.  If you smoke, do not smoke without supervision.  Keep all follow-up visits as told by your health care provider. This is important. Contact a health care provider if:  You keep feeling nauseous or you keep vomiting.  You feel light-headed.  You develop a rash.  You have a fever. Get help right away if:  You have trouble breathing. This information is not intended to replace advice given to you by your health care provider. Make sure you discuss any questions you have  with your health care provider. Document Released: 10/03/2013 Document Revised: 05/17/2016 Document Reviewed: 04/03/2016 Elsevier Interactive Patient Education  2018 [employer].   Liver Biopsy, Care After These instructions give you information on caring for yourself after your procedure. Your doctor may also give you more specific instructions. Call your doctor if you have any problems or questions after your procedure. Follow these instructions at home:  Rest at home for 1-2 days or as told by your doctor.  Have someone stay with you for at least 24 hours.  Do not do these things in the first 24 hours: ? Drive. ? Use machinery. ? Take care of other people. ? Sign legal documents. ? Take a bath or shower.  You may shower tomorrow.  There are many different ways to close and cover a cut (incision). For example, a cut can be closed with stitches, skin glue, or adhesive strips. Follow your doctor's instructions on: ? Taking care of your cut. ? Changing and removing your bandage (dressing).  You may remove your dresing tomorrow. ? Removing whatever was used to close your cut.  Do not drink alcohol in the first week.  Do not lift more than 5 pounds or play contact sports for the first 2 weeks.  Take medicines only as told by your doctor. For 1 week, do not take medicine that has aspirin in it.  Get your test results. Contact a doctor if:  A cut bleeds and leaves more than just a small spot of blood.  A cut is red, puffs up (swells), or hurts  more than before.  Fluid or something else comes from a cut.  A cut smells bad.  You have a fever or chills. Get help right away if:  You have swelling, bloating, or pain in your belly (abdomen).  You get dizzy or faint.  You have a rash.  You feel sick to your stomach (nauseous) or throw up (vomit).  You have trouble breathing, feel short of breath, or feel faint.  Your chest hurts.  You have problems talking or  seeing.  You have trouble balancing or moving your arms or legs. This information is not intended to replace advice given to you by your health care provider. Make sure you discuss any questions you have with your health care provider. Document Released: 09/21/2008 Document Revised: 05/20/2016 Document Reviewed: 02/08/2014 Elsevier Interactive Patient Education  [employer].

## 2018-06-14 NOTE — Sedation Documentation (Signed)
Patient is resting comfortably with eyes closed in NAD. 

## 2018-06-21 ENCOUNTER — Ambulatory Visit: Payer: Medicare Other | Admitting: Neurology

## 2018-06-23 ENCOUNTER — Ambulatory Visit: Payer: Self-pay | Admitting: Gastroenterology

## 2018-06-26 ENCOUNTER — Telehealth: Payer: Self-pay

## 2018-06-26 NOTE — Telephone Encounter (Signed)
Called and spoke with patient. She was asking about appointment with [facility]. I advised her she would have to call them for information regarding that appointment. I goggled [facility] and this number [phone] is what I found. I provided patient with this number and asked her to call them for information. Thanks!

## 2018-07-25 ENCOUNTER — Ambulatory Visit: Payer: Self-pay | Admitting: Gastroenterology

## 2018-07-26 ENCOUNTER — Encounter: Payer: Self-pay | Admitting: Physical Therapy

## 2018-07-26 ENCOUNTER — Ambulatory Visit: Payer: Medicare Other | Attending: Anesthesiology | Admitting: Physical Therapy

## 2018-07-26 ENCOUNTER — Other Ambulatory Visit: Payer: Self-pay

## 2018-07-26 DIAGNOSIS — R293 Abnormal posture: Secondary | ICD-10-CM | POA: Diagnosis present

## 2018-07-26 DIAGNOSIS — M6281 Muscle weakness (generalized): Secondary | ICD-10-CM | POA: Insufficient documentation

## 2018-07-26 DIAGNOSIS — M545 Low back pain: Secondary | ICD-10-CM | POA: Diagnosis not present

## 2018-07-26 DIAGNOSIS — G8929 Other chronic pain: Secondary | ICD-10-CM | POA: Diagnosis present

## 2018-07-26 NOTE — Therapy (Addendum)
[place], [place], [address] Phone: [phone]   Fax:  [phone]  Physical Therapy Evaluation / Discharge Summary  Patient Details  Name: Rhonda Hester MRN: [id number] Date of Birth: 12/08/1976 Referring Provider: Marciano Sequin    Encounter Date: 07/26/2018  PT End of Session - 07/26/18 1241    Visit Number  1    Number of Visits  9    Date for PT Re-Evaluation  08/23/18    Authorization Type  [employer] Medicare/Medicaid (PN 10th visit, KX 15th visit)    Authorization Time Period  07/26/18 to 09/20/18    Authorization - Visit Number  1    Authorization - Number of Visits  10    PT Start Time  1101    PT Stop Time  1140    PT Time Calculation (min)  39 min    Activity Tolerance  Patient tolerated treatment well    Behavior During Therapy  [facility] for tasks assessed/performed       Past Medical History:  Diagnosis Date  . Allergic asthma   . Breast mass 05/2017   lt breast lump  . Eczema   . GERD (gastroesophageal reflux disease)   . History of anal fissures   . History of Bell's palsy    2006  RIGHT SIDE--  RESOLVED  . History of gastroesophageal reflux (GERD)   . Hyperlipidemia   . Interstitial cystitis   . Sarcoidosis of lung ([place])    DX 2006--  PULMOLOGIST--  DR CBUL  . Seasonal allergic rhinitis   . Shortness of breath     Past Surgical History:  Procedure Laterality Date  . CYSTO WITH HYDRODISTENSION N/A 08/24/2013   Procedure: CYSTOSCOPY/HYDRODISTENSION with instillation of marcaine and pyridium;  Surgeon: Fredricka Bonine, MD;  Location: [facility];  Service: Urology;  Laterality: N/A;  . CYSTOSCOPY/ HYDRODISTENTION/ BLADDER BX  06-09-2010  . DILATION AND CURETTAGE OF UTERUS  1997  . IR TRANSCATHETER BX  06/14/2018  . IR US GUIDE VASC ACCESS RIGHT  06/14/2018  . IR VENOGRAM HEPATIC W HEMODYNAMIC EVALUATION  06/14/2018    There were no vitals filed for this  visit.   Subjective Assessment - 07/26/18 1103    Subjective  My back has been a problem "for a minute" especially when I sleep. Its been years, it started along with the sarcoidosis. It comes and goes, I can't predict when its going to get worse. No numbness or tingling, but I do have sharp pains at the bottom of my back. I do have pain when I have bowel movements, no incontinence. My pain stops me from doing a lot of things I love to do like working out and walking. I have to shift position a lot due to pain.     How long can you sit comfortably?  unable to state, "I've adapted to pain"     How long can you stand comfortably?  unable to state, "I've adapted to pain"     How long can you walk comfortably?  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  Yes    Pain Score  7     Pain Location  Back    Pain Orientation  Lower;Medial    Pain Descriptors / Indicators  Sharp;Throbbing    Pain Type  Chronic pain    Pain Radiating Towards  none     Pain Onset  More than a month ago    Pain Frequency  Constant    Aggravating Factors   tends to ebb and flow with sarcoidosis, laying down might make it worse     Pain Relieving Factors  tends to ebb and flow with sarcoidosis, changing position     Effect of Pain on Daily Activities  severe          [facility] PT Assessment - 07/26/18 0001      Assessment   Medical Diagnosis  LBP     Referring Provider  Marciano Sequin     Onset Date/Surgical Date  -- chronic     Next MD Visit  [place] early september     Prior Therapy  none       Precautions   Precautions  Other (comment)    Precaution Comments  avoid lifting heavy things over 5-10#       Restrictions   Weight Bearing Restrictions  No      Balance Screen   Has the patient fallen in the past 6 months  No    Has the patient had a decrease in activity level because of a fear of  falling?   Yes    Is the patient reluctant to leave their home because of a fear of falling?   No      Prior Function   Level of Independence  Independent;Independent with basic ADLs;Independent with gait;Independent with transfers    Vocation  On disability    Leisure  limited by sarcoidosis       Posture/Postural Control   Posture/Postural Control  Postural limitations    Postural Limitations  Rounded Shoulders;Forward head;Decreased lumbar lordosis    Posture Comments  sits very "slumped in chairs"       ROM / Strength   AROM / PROM / Strength  AROM;Strength      AROM   AROM Assessment Site  Lumbar    Lumbar Flexion  moderate limitation; RFIS painful     Lumbar Extension  severe limtations, painful     Lumbar - Right Side Bend  fingertips to knee     Lumbar - Left Side Bend  fingertips to knee       Strength   Strength Assessment Site  Knee;Hip    Right/Left Hip  Right;Left    Right Hip Flexion  3+/5    Right Hip Extension  3/5    Right Hip ABduction  4+/5    Left Hip Flexion  4-/5 painful     Left Hip Extension  3/5    Left Hip ABduction  4+/5    Right/Left Knee  Left;Right    Right Knee Flexion  4+/5    Right Knee Extension  4/5    Left Knee Flexion  4+/5    Left Knee Extension  4-/5      Flexibility   Soft Tissue Assessment /Muscle Length  yes                Objective measurements completed on examination: See above findings.      [place] Adult PT Treatment/Exercise - 07/26/18 0001      Exercises   Exercises  Lumbar      Lumbar Exercises: Stretches   Other Lumbar Stretch Exercise  lumbar flexion stretch 10x5 second holds    Other Lumbar Stretch Exercise  lumbar rotation stretch x5 each side  Lumbar Exercises: Seated   Other Seated Lumbar Exercises  TA set 3 second holds x10              PT Education - 07/26/18 1241    Education Details  exam findings, POC, HEP, prognosis    Person(s) Educated  Patient    Methods   Explanation;Demonstration;Handout    Comprehension  Verbalized understanding;Returned demonstration          PT Long Term Goals - 07/26/18 1246      PT LONG TERM GOAL #1   Title  Patient to demonstrate improvement of MMT by at least one grade in order to assist in improving mechanics and reducing pain     Time  8    Period  Weeks    Status  New    Target Date  09/20/18      PT LONG TERM GOAL #2   Title  Patient to demonstrate lumbar mobility as having improved by at least 30% in all directions in order to assist in reducing pain     Time  8    Period  Weeks    Status  New      PT LONG TERM GOAL #3   Title  Patient to report pain as being no more than 3/10 at worst in order to improve QOL and functional task tolerance     Time  8    Period  Weeks    Status  New      PT LONG TERM GOAL #4   Title  Patient to demonstrate correct functional mechanics for bed mobility and floor to waist lifting in order to avoid situations/movements that aggravate back pain     Time  8    Period  Weeks    Status  New      PT LONG TERM GOAL #5   Title  Patient to maintain correct upright posture at least 70% of the time in order to reduce stress on spine and promote correct functional mechanics     Time  8    Period  Weeks    Status  New             Plan - 07/26/18 1243    Clinical Impression Statement  Patient arrives with chronic back pain; she reports that it started at the same time as the sarcoidosis, and that her pain is unpredictable, it ebbs and flows with the sarcoidosis and she cannot always predict when it will get worse or better. Note referral in Epic for lumbar spine, patient arrives with paper referral for cervical spine as well, however focused on lumbar spine this evaluation. Examination reveals significant lumbar hypomobility and stiffness, postural impairments, reduced gross proprioception, functional muscle weakness, and impaired functional biomechanics. She may benefit  from skilled PT services to reduce pain and improve QOL moving forward.     History and Personal Factors relevant to plan of care:  sarcoidosis     Clinical Presentation  Evolving    Clinical Decision Making  Moderate    Rehab Potential  Fair    Clinical Impairments Affecting Rehab Potential  sarcoidosis increasing pain, chronicity of pain     PT Frequency  1x / week    PT Duration  8 weeks    PT Treatment/Interventions  ADLs/Self Care Home Management;Biofeedback;Cryotherapy;Electrical Stimulation;Iontophoresis 35m/ml Dexamethasone;Moist Heat;Ultrasound;DME Instruction;Gait training;Stair training;Functional mobility training;Therapeutic activities;Therapeutic exercise;Balance training;Neuromuscular re-education;Patient/family education;Manual techniques;Passive range of motion;Dry needling;Taping    PT Next Visit Plan  review  HEP and goals; patient cannot tolerate supine. Lumbar mobility, hip and LE flexiblity, strengthening for core and hips. Breathing strategies    PT Home Exercise Plan  Eval: seated lumbar stretch, lumbar rotation stretch, TA set, lumbar roll     Consulted and Agree with Plan of Care  Patient       Patient will benefit from skilled therapeutic intervention in order to improve the following deficits and impairments:  Increased fascial restricitons, Improper body mechanics, Pain, Decreased mobility, Increased muscle spasms, Postural dysfunction, Decreased strength, Hypomobility, Difficulty walking, Impaired flexibility  Visit Diagnosis: Chronic bilateral low back pain without sciatica - Plan: PT plan of care cert/re-cert  Muscle weakness (generalized) - Plan: PT plan of care cert/re-cert  Abnormal posture - Plan: PT plan of care cert/re-cert     Problem List Patient Active Problem List   Diagnosis Date Noted  . Enlarged submental lymph node 03/30/2018  . Nodule of soft tissue 03/22/2018  . Hemorrhoids 02/13/2018  . TMJ (temporomandibular joint disorder) 12/23/2017   . Acute pain of right shoulder 11/26/2017  . Chronic bilateral low back pain without sciatica 08/01/2017  . Chronic pain of right knee 08/01/2017  . Depression due to physical illness 04/02/2017  . Pelvic pain 03/28/2017  . Allergic reaction to food 03/19/2017  . Obesity (BMI 30.0-34.9) 03/19/2017  . Generalized pain 03/19/2017  . Metabolic syndrome 55/97/4163  . Medication management 03/19/2017  . Tobacco abuse 01/24/2017  . Uterine fibroid 01/17/2017  . Interstitial cystitis 01/17/2017  . Menorrhagia 12/06/2016  . Acne vulgaris 05/10/2016  . BMI 31.0-31.9,adult 05/10/2016  . Weight gain 05/10/2016  . Right hip pain 10/27/2014  . Sarcoidosis of lung ([place])   . Cervical adenopathy 03/12/2014  . History of smoking 08/07/2013  . Allergic asthma 07/22/2011    Deniece Ree PT, DPT, CBIS  Supplemental Physical Therapist [place]   Pager [facility] [place], [place], [address] Phone: [phone]   Fax:  [phone]  Name: Rhonda Hester MRN: [id number] Date of Birth: 1976-06-12     PHYSICAL THERAPY DISCHARGE SUMMARY  Visits from Start of Care: 1  Current functional level related to goals / functional outcomes: See goals   Remaining deficits: unknown   Education / Equipment: HEP  Plan: Patient agrees to discharge.  Patient goals were not met. Patient is being discharged due to not returning since the last visit.  ?????          Kristoffer Leamon PT, DPT, LAT, ATC  09/20/18  1:12 PM

## 2018-07-26 NOTE — Patient Instructions (Signed)
   Seated Lumbar Flexion Stretch  Start sitting on a chair with good posture, reach arms towards the floor until you feel a stretch in your lower back and hold for 5 seconds, then come up full upright position.   Repeat 5-10 times, twice a day.     While sitting in a chair or at the edge of your bed, walk your hands around to the right. You should feel a stretch in your back.  Hold for 5 seconds, then walk your hands around to the other side and stretch that side for 5 seconds.  Repeat 5 times each side, twice a day.      Transverse Abdominus Activation (can do sitting or standing)  Lying on your back, pull your bellybutton into your spine.   Hold for 3 seconds then relax.  Repeat 10 times, 3-5 times per day.       Seated Posture with Lumbar Roll  Place the lumbar roll as shown in the picture on the left.  Slide your bottom to the back of the chair and sit up straight so the roll provides support in the natural curve of your lower back.  Keep your shoulders back and head in a neutral position.  Maintain this position at all time when sitting.

## 2018-08-08 ENCOUNTER — Ambulatory Visit: Payer: Medicare Other | Attending: Anesthesiology | Admitting: Physical Therapy

## 2018-08-08 ENCOUNTER — Telehealth: Payer: Self-pay | Admitting: Physical Therapy

## 2018-08-08 NOTE — Telephone Encounter (Signed)
Spoke to patient regarding missed appointment this morning. Reminded her of her next two appointment times. She stated "we will see if I can make it." I asked her to call outpatient rehab at [phone] if she cannot attend her next appointments.

## 2018-08-10 ENCOUNTER — Encounter: Payer: Self-pay | Admitting: Family Medicine

## 2018-08-10 ENCOUNTER — Telehealth: Payer: Self-pay

## 2018-08-10 ENCOUNTER — Ambulatory Visit (INDEPENDENT_AMBULATORY_CARE_PROVIDER_SITE_OTHER): Payer: Medicare Other | Admitting: Family Medicine

## 2018-08-10 ENCOUNTER — Other Ambulatory Visit (INDEPENDENT_AMBULATORY_CARE_PROVIDER_SITE_OTHER): Payer: Medicare Other

## 2018-08-10 VITALS — BP 111/69 | HR 91 | Temp 98.7°F | Resp 16 | Ht 67.0 in | Wt 207.0 lb

## 2018-08-10 DIAGNOSIS — D860 Sarcoidosis of lung: Secondary | ICD-10-CM | POA: Diagnosis not present

## 2018-08-10 DIAGNOSIS — Z23 Encounter for immunization: Secondary | ICD-10-CM

## 2018-08-10 DIAGNOSIS — F0631 Mood disorder due to known physiological condition with depressive features: Secondary | ICD-10-CM | POA: Diagnosis not present

## 2018-08-10 DIAGNOSIS — K921 Melena: Secondary | ICD-10-CM

## 2018-08-10 LAB — FERRITIN: Ferritin: 67.5 ng/mL (ref 10.0–291.0)

## 2018-08-10 LAB — IBC PANEL
Iron: 68 ug/dL (ref 42–145)
SATURATION RATIOS: 12.1 % — AB (ref 20.0–50.0)
TRANSFERRIN: 402 mg/dL — AB (ref 212.0–360.0)

## 2018-08-10 NOTE — Progress Notes (Signed)
Patient [facility]   Progress Note: General Provider: Lanae Boast, FNP  SUBJECTIVE:   Chief Complaint  Patient presents with  . Follow-up    scarcoidosis     Patient presents for follow up on sarcoidosis. Patient is followed by [facility]. Lung functioning has decreased. Patient started on methtrexate and follic acid. Patient also seen by psychiatry Bernita Raisin) and was prescribed buspar and wellbutrin yesterday. Has not started taking this. Has questions about her medications. Patient seen by GI for increased liver function d/t fatty liver disease.  Patient states that she is having difficulty with ambulating up and down the stairs at her apartment due to increased SOB. Also considering a live in aid to help her at home.     Past Medical History:  Diagnosis Date  . Allergic asthma   . Breast mass 05/2017   lt breast lump  . Eczema   . GERD (gastroesophageal reflux disease)   . History of anal fissures   . History of Bell's palsy    2006  RIGHT SIDE--  RESOLVED  . History of gastroesophageal reflux (GERD)   . Hyperlipidemia   . Interstitial cystitis   . Sarcoidosis of lung ([place])    DX 2006--  PULMOLOGIST--  DR [facility]MGN  . Seasonal allergic rhinitis   . Shortness of breath     Social History   Socioeconomic History  . Marital status: Single    Spouse name: Not on file  . Number of children: 0  . Years of education: Not on file  . Highest education level: Not on file  Occupational History  . Occupation: disabled  Social Needs  . Financial resource strain: Not on file  . Food insecurity:    Worry: Not on file    Inability: Not on file  . Transportation needs:    Medical: Not on file    Non-medical: Not on file  Tobacco Use  . Smoking status: Former Smoker    Packs/day: 0.30    Years: 6.00    Pack years: 1.80    Types: Cigarettes, Cigars    Last attempt to quit: 11/2016    Years since quitting: 1.7  .  Smokeless tobacco: Never Used  . Tobacco comment: ONE CIG. DAILY /  1 PPMONTH  Substance and Sexual Activity  . Alcohol use: No    Alcohol/week: 0.0 standard drinks  . Drug use: No    Comment: HX MARIJUANA USE  . Sexual activity: Never    Birth control/protection: None    Comment: abstinence   Lifestyle  . Physical activity:    Days per week: Not on file    Minutes per session: Not on file  . Stress: Not on file  Relationships  . Social connections:    Talks on phone: Not on file    Gets together: Not on file    Attends religious service: Not on file    Active member of club or organization: Not on file    Attends meetings of clubs or organizations: Not on file    Relationship status: Not on file  . Intimate partner violence:    Fear of current or ex partner: Not on file    Emotionally abused: Not on file    Physically abused: Not on file    Forced sexual activity: Not on file  Other Topics Concern  . Not on file  Social History Narrative  . Not on file  Review of Systems  Constitutional: Negative.   HENT: Negative.   Eyes: Negative.   Respiratory: Negative.   Cardiovascular: Negative.   Gastrointestinal: Negative.   Genitourinary: Negative.   Musculoskeletal: Negative.   Skin: Negative.   Neurological: Negative.   Psychiatric/Behavioral: Negative.     OBJECTIVE: BP 111/69 (BP Location: Left Arm, Patient Position: Sitting, Cuff Size: Large)   Pulse 91   Temp 98.7 F (37.1 C) (Oral)   Resp 16   Ht 5\' 7"  (1.702 m)   Wt 207 lb (93.9 kg)   SpO2 97%   BMI 32.42 kg/m   Physical Exam  Constitutional: She is oriented to person, place, and time. She appears well-developed and well-nourished. No distress.  HENT:  Head: Normocephalic and atraumatic.  Eyes: Pupils are equal, round, and reactive to light. EOM are normal.  Cardiovascular: Normal rate, regular rhythm, normal heart sounds and intact distal pulses.  No murmur heard. Pulmonary/Chest: Effort  normal and breath sounds normal. No respiratory distress.  Musculoskeletal: She exhibits no edema.  Neurological: She is alert and oriented to person, place, and time.  Psychiatric: Her speech is normal. Judgment and thought content normal. She is withdrawn. She is not actively hallucinating. Cognition and memory are normal. She exhibits a depressed mood. She expresses no homicidal and no suicidal ideation.  Nursing note and vitals reviewed.   ASSESSMENT/PLAN:  1. Sarcoidosis of lung ([place]) Continue with current medications and follow up visits with specialists.  - Flu Vaccine QUAD 6+ mos PF IM (Fluarix Quad PF)  2. Depression due to physical illness Discussed medications and side effects with patient. Encouraged psychotherapy along with medications.        The patient was given clear instructions to go to ER or return to medical center if symptoms do not improve, worsen or new problems develop. The patient verbalized understanding and agreed with plan of care.   Ms. Doug Sou. Nathaneil Canary, FNP-BC [facility] [address] [place], [place] [address] [phone]     This note has been created with Dragon speech recognition software and smart phrase technology. Any transcriptional errors are unintentional.

## 2018-08-10 NOTE — Telephone Encounter (Signed)
-----   Message from Timothy Lasso, RN sent at 05/10/2018 10:04 AM EDT ----- Pt needs to have repeat ferritin and iron saturation see 5/15 note

## 2018-08-10 NOTE — Patient Instructions (Signed)
Methotrexate tablets What is this medicine? METHOTREXATE (METH oh TREX ate) is a chemotherapy drug used to treat cancer including breast cancer, leukemia, and lymphoma. This medicine can also be used to treat psoriasis and certain kinds of arthritis. This medicine may be used for other purposes; ask your health care provider or pharmacist if you have questions. COMMON BRAND NAME(S): Rheumatrex, Trexall What should I tell my health care provider before I take this medicine? They need to know if you have any of these conditions: -fluid in the stomach area or lungs -if you often drink alcoholSarcoidosis Sarcoidosis is a disease that causes inflammation in your organs and other areas of your body. The lungs are most often affected (pulmonary sarcoidosis). Sarcoidosis can also affect your lymph nodes, liver, eyes, skin, or any other body tissue. When you have sarcoidosis, small clumps of tissue (granulomas) form in the affected area of your body. Granulomas are made up of your body's defense (immune) cells. Inflammation results when your body reacts to a harmful substance. Normally, inflammation goes away after immune cells get rid of the harmful substance. In sarcoidosis, the immune cells form granulomas instead. What are the causes? The exact cause of sarcoidosis is not known. Something triggers the immune system to respond, such as dust, chemicals, bacteria, or a virus. What increases the risk? You may be at a greater risk for sarcoidosis if you:  Have a family history of the disease.  Are African American.  Are of Northern European ancestry.  Are 47-61 years old.  Are female.  What are the signs or symptoms? Many people with sarcoidosis have no symptoms. Others have very mild symptoms. Sarcoidosis most often affects the lungs. Symptoms include:  Chest pain.  Coughing.  Wheezing.  Shortness of breath.  Other common symptoms include:  Night sweats.  Weight  loss.  Fatigue.  Depression.  A sense of uneasiness.  How is this diagnosed? Sarcoidosis may be diagnosed by:  Medical history and physical exam.  Chest X-ray. This looks for granulomas in your lungs.  Lung function tests. These measure your breathing and look for problems related to sarcoidosis.  Examining a sample of tissue under a microscope (biopsy).  How is this treated? Sarcoidosis usually clears up without treatment. You may take medicines to reduce inflammation or relieve symptoms. These may include:  Prednisone. This steroid reduces inflammation related to sarcoidosis.  Chloroquine or hydroxychloroquine. These are antimalarial medicines used to treat sarcoidosis that affects the skin or brain.  Methotrexate, leflunomide, or azathioprine. These medicines affect the immune system and can help with sarcoidosis in the joints, eyes, skin, or lungs.  Inhalers. Inhaled medicines can help you breathe if sarcoidosis is affecting your lungs.  Follow these instructions at home:  Do not use any tobacco products, including cigarettes, chewing tobacco, or electronic cigarettes. If you need help quitting, ask your health care provider.  Avoid secondhand smoke.  Avoid irritating dust and chemicals. Stay indoors on days when air quality is poor in your area.  Take medicines only as directed by your health care provider. Contact a health care provider if:  You have vision problems.  You have shortness of breath.  You have a dry, persistent cough.  You have an irregular heartbeat.  You have pain or ache in your joints, hands, or feet.  You have an unexplained rash. Get help right away if: You have chest pain. This information is not intended to replace advice given to you by your health care provider. Make sure  you discuss any questions you have with your health care provider. Document Released: 10/13/2004 Document Revised: 05/20/2016 Document Reviewed:  04/10/2014 Elsevier Interactive Patient Education  2018 [employer].  -infection or immune system problems -kidney disease or on hemodialysis -liver disease -low blood counts, like low white cell, platelet, or red cell counts -lung disease -radiation therapy -stomach ulcers -ulcerative colitis -an unusual or allergic reaction to methotrexate, other medicines, foods, dyes, or preservatives -pregnant or trying to get pregnant -breast-feeding How should I use this medicine? Take this medicine by mouth with a glass of water. Follow the directions on the prescription label. Take your medicine at regular intervals. Do not take it more often than directed. Do not stop taking except on your doctor's advice. Make sure you know why you are taking this medicine and how often you should take it. If this medicine is used for a condition that is not cancer, like arthritis or psoriasis, it should be taken weekly, NOT daily. Taking this medicine more often than directed can cause serious side effects, even death. Talk to your healthcare provider about safe handling and disposal of this medicine. You may need to take special precautions. Talk to your pediatrician regarding the use of this medicine in children. While this drug may be prescribed for selected conditions, precautions do apply. Overdosage: If you think you have taken too much of this medicine contact a poison control center or emergency room at once. NOTE: This medicine is only for you. Do not share this medicine with others. What if I miss a dose? If you miss a dose, talk with your doctor or health care professional. Do not take double or extra doses. What may interact with this medicine? This medicine may interact with the following medication: -acitretin -aspirin and aspirin-like medicines including salicylates -azathioprine -certain antibiotics like penicillins, tetracycline, and  chloramphenicol -cyclosporine -gold -hydroxychloroquine -live virus vaccines -NSAIDs, medicines for pain and inflammation, like ibuprofen or naproxen -other cytotoxic agents -penicillamine -phenylbutazone -phenytoin -probenecid -retinoids such as isotretinoin and tretinoin -steroid medicines like prednisone or cortisone -sulfonamides like sulfasalazine and trimethoprim/sulfamethoxazole -theophylline This list may not describe all possible interactions. Give your health care provider a list of all the medicines, herbs, non-prescription drugs, or dietary supplements you use. Also tell them if you smoke, drink alcohol, or use illegal drugs. Some items may interact with your medicine. What should I watch for while using this medicine? Avoid alcoholic drinks. This medicine can make you more sensitive to the sun. Keep out of the sun. If you cannot avoid being in the sun, wear protective clothing and use sunscreen. Do not use sun lamps or tanning beds/booths. You may need blood work done while you are taking this medicine. Call your doctor or health care professional for advice if you get a fever, chills or sore throat, or other symptoms of a cold or flu. Do not treat yourself. This drug decreases your body's ability to fight infections. Try to avoid being around people who are sick. This medicine may increase your risk to bruise or bleed. Call your doctor or health care professional if you notice any unusual bleeding. Check with your doctor or health care professional if you get an attack of severe diarrhea, nausea and vomiting, or if you sweat a lot. The loss of too much body fluid can make it dangerous for you to take this medicine. Talk to your doctor about your risk of cancer. You may be more at risk for certain types of cancers if  you take this medicine. Both men and women must use effective birth control with this medicine. Do not become pregnant while taking this medicine or until at least 1  normal menstrual cycle has occurred after stopping it. Women should inform their doctor if they wish to become pregnant or think they might be pregnant. Men should not father a child while taking this medicine and for 3 months after stopping it. There is a potential for serious side effects to an unborn child. Talk to your health care professional or pharmacist for more information. Do not breast-feed an infant while taking this medicine. What side effects may I notice from receiving this medicine? Side effects that you should report to your doctor or health care professional as soon as possible: -allergic reactions like skin rash, itching or hives, swelling of the face, lips, or tongue -breathing problems or shortness of breath -diarrhea -dry, nonproductive cough -low blood counts - this medicine may decrease the number of white blood cells, red blood cells and platelets. You may be at increased risk for infections and bleeding. -mouth sores -redness, blistering, peeling or loosening of the skin, including inside the mouth -signs of infection - fever or chills, cough, sore throat, pain or trouble passing urine -signs and symptoms of bleeding such as bloody or black, tarry stools; red or dark-brown urine; spitting up blood or brown material that looks like coffee grounds; red spots on the skin; unusual bruising or bleeding from the eye, gums, or nose -signs and symptoms of kidney injury like trouble passing urine or change in the amount of urine -signs and symptoms of liver injury like dark yellow or brown urine; general ill feeling or flu-like symptoms; light-colored stools; loss of appetite; nausea; right upper belly pain; unusually weak or tired; yellowing of the eyes or skin Side effects that usually do not require medical attention (report to your doctor or health care professional if they continue or are bothersome): -dizziness -hair loss -tiredness -upset stomach -vomiting This list may  not describe all possible side effects. Call your doctor for medical advice about side effects. You may report side effects to FDA at 1-800-FDA-1088. Where should I keep my medicine? Keep out of the reach of children. Store at room temperature between 20 and 25 degrees C (68 and 77 degrees F). Protect from light. Throw away any unused medicine after the expiration date. NOTE: This sheet is a summary. It may not cover all possible information. If you have questions about this medicine, talk to your doctor, pharmacist, or health care provider.  2018 Elsevier/Gold Standard (2015-08-18 05:39:22)

## 2018-08-11 ENCOUNTER — Ambulatory Visit: Payer: Medicare Other | Admitting: Physical Therapy

## 2018-08-14 ENCOUNTER — Ambulatory Visit: Payer: Medicare Other | Admitting: Physical Therapy

## 2018-08-22 ENCOUNTER — Ambulatory Visit: Payer: Medicare Other | Admitting: Neurology

## 2018-08-22 DIAGNOSIS — D8689 Sarcoidosis of other sites: Secondary | ICD-10-CM | POA: Insufficient documentation

## 2018-08-22 DIAGNOSIS — D860 Sarcoidosis of lung: Secondary | ICD-10-CM | POA: Insufficient documentation

## 2018-08-31 ENCOUNTER — Ambulatory Visit: Payer: Self-pay | Admitting: Family Medicine

## 2018-09-01 ENCOUNTER — Ambulatory Visit (INDEPENDENT_AMBULATORY_CARE_PROVIDER_SITE_OTHER): Payer: Medicare Other | Admitting: Family Medicine

## 2018-09-01 ENCOUNTER — Telehealth: Payer: Self-pay

## 2018-09-01 ENCOUNTER — Encounter: Payer: Self-pay | Admitting: Family Medicine

## 2018-09-01 VITALS — BP 129/73 | HR 102 | Temp 98.6°F | Resp 16 | Ht 67.0 in | Wt 211.0 lb

## 2018-09-01 DIAGNOSIS — G894 Chronic pain syndrome: Secondary | ICD-10-CM | POA: Diagnosis not present

## 2018-09-01 DIAGNOSIS — D860 Sarcoidosis of lung: Secondary | ICD-10-CM

## 2018-09-01 DIAGNOSIS — M25562 Pain in left knee: Secondary | ICD-10-CM

## 2018-09-01 DIAGNOSIS — Z09 Encounter for follow-up examination after completed treatment for conditions other than malignant neoplasm: Secondary | ICD-10-CM

## 2018-09-01 DIAGNOSIS — M545 Low back pain, unspecified: Secondary | ICD-10-CM

## 2018-09-01 DIAGNOSIS — R590 Localized enlarged lymph nodes: Secondary | ICD-10-CM

## 2018-09-01 DIAGNOSIS — R599 Enlarged lymph nodes, unspecified: Secondary | ICD-10-CM

## 2018-09-01 DIAGNOSIS — M25561 Pain in right knee: Secondary | ICD-10-CM

## 2018-09-01 DIAGNOSIS — G8929 Other chronic pain: Secondary | ICD-10-CM

## 2018-09-01 MED ORDER — KETOROLAC TROMETHAMINE 60 MG/2ML IM SOLN
60.0000 mg | Freq: Once | INTRAMUSCULAR | Status: AC
  Administered 2018-09-01: 60 mg via INTRAMUSCULAR

## 2018-09-01 MED ORDER — TRAMADOL HCL 50 MG PO TABS: 50.0000 mg | ORAL_TABLET | Freq: Four times a day (QID) | ORAL | 0 refills | Status: DC | PRN

## 2018-09-01 MED ORDER — CYCLOBENZAPRINE HCL 5 MG PO TABS
5.0000 mg | ORAL_TABLET | Freq: Every evening | ORAL | 1 refills | Status: DC | PRN
Start: 2018-09-01 — End: 2018-10-03

## 2018-09-01 NOTE — Telephone Encounter (Signed)
Pharmacy called stating that patient had pick up a script for Oxycodone on 08/17/2018 and wanted to know if patient was suppose to be on that plus Tramadol. Per [facility] is not to fill the Tramadol.

## 2018-09-01 NOTE — Progress Notes (Signed)
Sick Visit  Subjective:    Patient ID: Rhonda Hester, female    DOB: Mar 30, 1976, 42 y.o.   MRN: [id number]   Chief Complaint  Patient presents with  . Lymphadenopathy    in neck   . Knee Pain     HPI  Rhonda Hester is a 42 year old female with a past medical history of Seasonal Allergies, Sarcoidosis, Hyperlipidemia, GERD, Bells Palsy, GERD, Eczema, Breath Mass, and Shortness of Breath. She is here today for a Sick Visit.   Current Status: Since her last office visit, she has been having bilateral knee pain for a few weeks now. She states that she pain comes and goes. She rates her knee pain at 8/10 today. She has not had any trauma to knees. Reports swollen gland under left ear She has mild fatigue. She denies fevers, chills, recent infections, weight loss, and night sweats. She reports lack of appetite and occasional nausea.  No reports of GI problems such as vomiting, diarrhea, and constipation. She has no reports of blood in stools, dysuria and hematuria. She reports mild anxiety today. She continues to follow up with physician at [facility] for Sarcoidosis.   She has not had any headaches, visual changes, dizziness, and falls. No chest pain, heart palpitations, cough and shortness of breath reported. She denies pain today.   Past Medical History:  Diagnosis Date  . Allergic asthma   . Breast mass 05/2017   lt breast lump  . Eczema   . GERD (gastroesophageal reflux disease)   . History of anal fissures   . History of Bell's palsy    2006  RIGHT SIDE--  RESOLVED  . History of gastroesophageal reflux (GERD)   . Hyperlipidemia   . Interstitial cystitis   . Sarcoidosis of lung ([place])    DX 2006--  PULMOLOGIST--  DR GYBW  . Seasonal allergic rhinitis   . Shortness of breath     Family History  Problem Relation Age of Onset  . Brain cancer Maternal Grandmother   . Hyperlipidemia Other   . Sleep apnea Other   . Depression Maternal Aunt   . Seizures Maternal Uncle     Social  History   Socioeconomic History  . Marital status: Single    Spouse name: Not on file  . Number of children: 0  . Years of education: Not on file  . Highest education level: Not on file  Occupational History  . Occupation: disabled  Social Needs  . Financial resource strain: Not on file  . Food insecurity:    Worry: Not on file    Inability: Not on file  . Transportation needs:    Medical: Not on file    Non-medical: Not on file  Tobacco Use  . Smoking status: Former Smoker    Packs/day: 0.30    Years: 6.00    Pack years: 1.80    Types: Cigarettes, Cigars    Last attempt to quit: 11/2016    Years since quitting: 1.7  . Smokeless tobacco: Never Used  . Tobacco comment: ONE CIG. DAILY /  1 PPMONTH  Substance and Sexual Activity  . Alcohol use: No    Alcohol/week: 0.0 standard drinks  . Drug use: No    Comment: HX MARIJUANA USE  . Sexual activity: Never    Birth control/protection: None    Comment: abstinence   Lifestyle  . Physical activity:    Days per week: Not on file    Minutes per session: Not  on file  . Stress: Not on file  Relationships  . Social connections:    Talks on phone: Not on file    Gets together: Not on file    Attends religious service: Not on file    Active member of club or organization: Not on file    Attends meetings of clubs or organizations: Not on file    Relationship status: Not on file  . Intimate partner violence:    Fear of current or ex partner: Not on file    Emotionally abused: Not on file    Physically abused: Not on file    Forced sexual activity: Not on file  Other Topics Concern  . Not on file  Social History Narrative  . Not on file    Past Surgical History:  Procedure Laterality Date  . CYSTO WITH HYDRODISTENSION N/A 08/24/2013   Procedure: CYSTOSCOPY/HYDRODISTENSION with instillation of marcaine and pyridium;  Surgeon: Fredricka Bonine, MD;  Location: [facility];  Service: Urology;  Laterality:  N/A;  . CYSTOSCOPY/ HYDRODISTENTION/ BLADDER BX  06-09-2010  . DILATION AND CURETTAGE OF UTERUS  1997  . IR TRANSCATHETER BX  06/14/2018  . IR US GUIDE VASC ACCESS RIGHT  06/14/2018  . IR VENOGRAM HEPATIC W HEMODYNAMIC EVALUATION  06/14/2018    Immunization History  Administered Date(s) Administered  . Influenza Whole 09/02/2011, 09/26/2012  . Influenza,inj,Quad PF,6+ Mos 12/27/2012, 09/04/2015, 09/20/2017, 08/10/2018  . Pneumococcal Polysaccharide-23 05/10/2016  . Tdap 05/10/2016    Current Meds  Medication Sig  . albuterol (PROAIR HFA) 108 (90 Base) MCG/ACT inhaler Inhale 2 puffs into the lungs every 6 (six) hours as needed for shortness of breath.  Marland Kitchen buPROPion (WELLBUTRIN XL) 150 MG 24 hr tablet Take 150 mg by mouth daily.  . busPIRone (BUSPAR) 10 MG tablet Take 10 mg by mouth 2 (two) times daily.  . cetirizine (ZYRTEC) 10 MG tablet Take 1 tablet (10 mg total) by mouth daily.  . folic acid (FOLVITE) 1 MG tablet Take 1 mg by mouth daily.  Marland Kitchen gabapentin (NEURONTIN) 300 MG capsule Take 3 capsules (900 mg total) by mouth at bedtime.  . methotrexate (RHEUMATREX) 2.5 MG tablet Take 2.5 mg by mouth once a week. Caution:Chemotherapy. Protect from light.  . metoCLOPramide (REGLAN) 10 MG tablet Take 1 tablet (10 mg total) by mouth every 6 (six) hours.  Marland Kitchen oxyCODONE (OXY IR/ROXICODONE) 5 MG immediate release tablet Take 5 mg by mouth every 6 (six) hours as needed for severe pain.   Current Facility-Administered Medications for the 09/01/18 encounter (Office Visit) with Azzie Glatter, FNP  Medication  . leuprolide ([name]) injection 11.25 mg  . leuprolide ([name]) injection 3.75 mg    Allergies  Allergen Reactions  . Acyclovir And Related     Patient states it "made me feel like I can't breath"   . Citric Acid Other (See Comments)    AVOIDS DUE TO INTERSTITIAL CYSTITIS  . Dust Mite Extract Itching and Other (See Comments)     coughing  . Grapeseed Extract [Nutritional Supplements]      Throat itching and burning  . Pineapple   . Pollen Extract Itching and Other (See Comments)     coughing  . Nickel Rash    BP 129/73 (BP Location: Right Arm, Patient Position: Sitting, Cuff Size: Normal)   Pulse (!) 102   Temp 98.6 F (37 C) (Oral)   Resp 16   Ht 5\' 7"  (1.702 m)   Wt 211 lb (  95.7 kg)   SpO2 96%   BMI 33.05 kg/m .   Review of Systems  Constitutional: Positive for appetite change (lack of appetite) and fatigue.  HENT: Negative.   Eyes: Negative.   Respiratory: Negative.   Cardiovascular: Negative.   Gastrointestinal: Positive for nausea.  Endocrine: Negative.   Genitourinary: Negative.   Musculoskeletal: Arthralgias: bilateral knee pain.  Skin: Negative.   Neurological: Negative.   Hematological: Negative.   Psychiatric/Behavioral: Negative.    Objective:   Physical Exam  HENT:  Head:      Assessment & Plan:   1. Acute bilateral knee pain We will give her Toradol injection in office today. She will continue pan meds as prescribed.  - ketorolac (TORADOL) injection 60 mg  2. Chronic bilateral low back pain without sciatica  3. Chronic pain syndrome She is followed by Pain Management. Continue to follow up as needed.   4. Enlarged lymph node Left tonsillar palpable lymph node. She reports frequent swollen glands, probable r/t Sarcoidosis.  - cyclobenzaprine (FLEXERIL) 5 MG tablet; Take 1 tablet (5 mg total) by mouth at bedtime as needed for muscle spasms.  Dispense: 30 tablet; Refill: 1 - CBC with Differential - Comprehensive metabolic panel  5. Sarcoidosis of lung ([place]) Stable today. She continues to follow up with Hematologist at [facility].   6. Follow up She will follow up in 2 months.   Meds ordered this encounter  Medications  . cyclobenzaprine (FLEXERIL) 5 MG tablet    Sig: Take 1 tablet (5 mg total) by mouth at bedtime as needed for muscle spasms.    Dispense:  30 tablet    Refill:  1  . ketorolac (TORADOL) injection 60 mg  .  DISCONTD: traMADol (ULTRAM) 50 MG tablet    Sig: Take 1 tablet (50 mg total) by mouth every 6 (six) hours as needed.    Dispense:  30 tablet    Refill:  0    Order Specific Question:   Supervising Provider    Answer:   Tresa Garter [[id number]]   Kathe Becton,  MSN, FNP-C [facility] [address] [place], [place] [address] [phone]

## 2018-09-02 LAB — COMPREHENSIVE METABOLIC PANEL
ALT: 47 IU/L — ABNORMAL HIGH (ref 0–32)
AST: 48 IU/L — ABNORMAL HIGH (ref 0–40)
Albumin/Globulin Ratio: 0.7 — ABNORMAL LOW (ref 1.2–2.2)
Albumin: 3.6 g/dL (ref 3.5–5.5)
Alkaline Phosphatase: 289 IU/L — ABNORMAL HIGH (ref 39–117)
BUN/Creatinine Ratio: 16 (ref 9–23)
BUN: 12 mg/dL (ref 6–24)
Bilirubin Total: 1 mg/dL (ref 0.0–1.2)
CO2: 24 mmol/L (ref 20–29)
Calcium: 9.6 mg/dL (ref 8.7–10.2)
Chloride: 99 mmol/L (ref 96–106)
Creatinine, Ser: 0.74 mg/dL (ref 0.57–1.00)
GFR calc Af Amer: 116 mL/min/{1.73_m2} (ref >59)
GFR calc non Af Amer: 101 mL/min/{1.73_m2} (ref >59)
Globulin, Total: 4.9 g/dL — ABNORMAL HIGH (ref 1.5–4.5)
Glucose: 98 mg/dL (ref 65–99)
Potassium: 3.8 mmol/L (ref 3.5–5.2)
Sodium: 138 mmol/L (ref 134–144)
Total Protein: 8.5 g/dL (ref 6.0–8.5)

## 2018-09-02 LAB — CBC WITH DIFFERENTIAL/PLATELET
Basophils Absolute: 0 10*3/uL (ref 0.0–0.2)
Basos: 1 %
EOS (ABSOLUTE): 0.6 10*3/uL — ABNORMAL HIGH (ref 0.0–0.4)
Eos: 8 %
Hematocrit: 32.8 % — ABNORMAL LOW (ref 34.0–46.6)
Hemoglobin: 10.2 g/dL — ABNORMAL LOW (ref 11.1–15.9)
Immature Grans (Abs): 0 10*3/uL (ref 0.0–0.1)
Immature Granulocytes: 0 %
Lymphocytes Absolute: 1.1 10*3/uL (ref 0.7–3.1)
Lymphs: 17 %
MCH: 26.2 pg — ABNORMAL LOW (ref 26.6–33.0)
MCHC: 31.1 g/dL — ABNORMAL LOW (ref 31.5–35.7)
MCV: 84 fL (ref 79–97)
Monocytes Absolute: 0.7 10*3/uL (ref 0.1–0.9)
Monocytes: 11 %
Neutrophils Absolute: 4.2 10*3/uL (ref 1.4–7.0)
Neutrophils: 63 %
Platelets: 389 10*3/uL (ref 150–450)
RBC: 3.9 x10E6/uL (ref 3.77–5.28)
RDW: 21.5 % — ABNORMAL HIGH (ref 12.3–15.4)
WBC: 6.7 10*3/uL (ref 3.4–10.8)

## 2018-09-08 ENCOUNTER — Encounter: Payer: Self-pay | Admitting: Gastroenterology

## 2018-09-08 ENCOUNTER — Ambulatory Visit (INDEPENDENT_AMBULATORY_CARE_PROVIDER_SITE_OTHER): Payer: Medicare Other | Admitting: Gastroenterology

## 2018-09-08 VITALS — BP 104/74 | HR 70 | Ht 67.0 in | Wt 207.5 lb

## 2018-09-08 DIAGNOSIS — D869 Sarcoidosis, unspecified: Secondary | ICD-10-CM

## 2018-09-08 DIAGNOSIS — K625 Hemorrhage of anus and rectum: Secondary | ICD-10-CM

## 2018-09-08 DIAGNOSIS — K5909 Other constipation: Secondary | ICD-10-CM

## 2018-09-08 DIAGNOSIS — R197 Diarrhea, unspecified: Secondary | ICD-10-CM | POA: Diagnosis not present

## 2018-09-08 MED ORDER — HYDROCORTISONE ACETATE 25 MG RE SUPP: 25.0000 mg | Freq: Every day | RECTAL | 0 refills | Status: DC

## 2018-09-08 NOTE — Progress Notes (Signed)
Rhonda Hester    [id number]    Aug 09, 1976  Primary Care Physician:Douglas, Venora Maples, [place]  Referring Physician: Dorena Dew, FNP [address], [place] [address]  Chief complaint: Rectal bleeding  HPI: 42 year old African-American female with history of sarcoidosis with involvement of lung, liver, skin and joint.  She is followed by [facility] for pulmonary sarcoidosis.  She has been on and off prednisone for many years now.  She was weaned off prednisone about 2 months ago and was started on methotrexate. Patient was last seen here April 2019 by [facility] for abnormal LFT, splenomegaly and bright red blood per rectum.  She was on prednisone 20 mg daily at the time.  Patient was prescribed rectal hydrocortisone cream and was recommended sitz bath twice daily.  She had some improvement for a few weeks but started having recurrent symptoms with bright red blood per rectum almost daily and she feels its progressively getting worse in the past 2 to 3 months.  She sees blood mixed with stool and also coating stool at times.  She has mild discomfort and left upper quadrant and also right upper quadrant.  She has irregular bowel habits and sometimes has to strain during bowel movement.  Denies any dysphagia, nausea, vomiting, loss of appetite or weight loss.  Ferritin 41, TTG IgA antibody less than 4, hepatitis A and C antibody nonreactive, hepatitis B surface antibody positive and negative surface antigen.  ANA negative.  Anti-smooth muscle antibody slightly inserted positive 26 (normal range less than 20) Liver biopsy June 2019 consistent with hepatic sarcoidosis Cardiac MRI August 2019 negative for cardiac involvement CT chest January 2006 bilateral hilar and mediastinal lymphadenopathy, bilateral nodular interstitial opacities compatible with sarcoid CT chest April 2015 stable changes of pulmonary sarcoidosis CT chest October 2017  stable changes of pulmonary sarcoidosis CT chest April 2019 interval progression of mid to upper lung predominant fibrotic interstitial lung disease compatible with end-stage sarcoidosis Stable mild chronic mediastinal and bilateral hilar adenopathy Splenomegaly partially visualized     Outpatient Encounter Medications as of 09/08/2018  Medication Sig  . albuterol (PROAIR HFA) 108 (90 Base) MCG/ACT inhaler Inhale 2 puffs into the lungs every 6 (six) hours as needed for shortness of breath.  Marland Kitchen buPROPion (WELLBUTRIN XL) 150 MG 24 hr tablet Take 150 mg by mouth daily.  . busPIRone (BUSPAR) 10 MG tablet Take 10 mg by mouth 2 (two) times daily.  . cetirizine (ZYRTEC) 10 MG tablet Take 1 tablet (10 mg total) by mouth daily.  . cyclobenzaprine (FLEXERIL) 5 MG tablet Take 1 tablet (5 mg total) by mouth at bedtime as needed for muscle spasms.  . folic acid (FOLVITE) 1 MG tablet Take 1 mg by mouth daily.  Marland Kitchen gabapentin (NEURONTIN) 300 MG capsule Take 3 capsules (900 mg total) by mouth at bedtime.  . hydrocortisone (ANUSOL-HC) 2.5 % rectal cream Place 1 application rectally 2 (two) times daily.  . methotrexate (RHEUMATREX) 2.5 MG tablet Take 2.5 mg by mouth once a week. Caution:Chemotherapy. Protect from light.  . metoCLOPramide (REGLAN) 10 MG tablet Take 1 tablet (10 mg total) by mouth every 6 (six) hours.  Marland Kitchen oxyCODONE (OXY IR/ROXICODONE) 5 MG immediate release tablet Take 5 mg by mouth every 6 (six) hours as needed for severe pain.  . [DISCONTINUED] ibuprofen (ADVIL,MOTRIN) 800 MG tablet Take 1 tablet (800 mg total) by mouth every 8 (eight) hours as needed.  . [  DISCONTINUED] predniSONE (DELTASONE) 20 MG tablet Take 1 tablet (20 mg total) by mouth daily with breakfast.  . [DISCONTINUED] DULoxetine (CYMBALTA) 30 MG capsule Take 1 capsule (30 mg total) by mouth daily for 7 days, THEN 1 capsule (30 mg total) 2 (two) times daily.   Facility-Administered Encounter Medications as of 09/08/2018  Medication  .  bupivacaine (MARCAINE) 0.5 % 15 mL, phenazopyridine (PYRIDIUM) 400 mg bladder mixture  . leuprolide ([name]) injection 11.25 mg  . leuprolide ([name]) injection 3.75 mg    Allergies as of 09/08/2018 - Review Complete 09/08/2018  Allergen Reaction Noted  . Acyclovir and related  03/18/2017  . Citric acid Other (See Comments)   . Dust mite extract Itching and Other (See Comments) 11/01/2012  . Grapeseed extract [nutritional supplements]  03/18/2017  . Pineapple  05/09/2017  . Pollen extract Itching and Other (See Comments) 11/01/2012  . Nickel Rash 06/20/2013    Past Medical History:  Diagnosis Date  . Allergic asthma   . Breast mass 05/2017   lt breast lump  . Eczema   . GERD (gastroesophageal reflux disease)   . History of anal fissures   . History of Bell's palsy    2006  RIGHT SIDE--  RESOLVED  . History of gastroesophageal reflux (GERD)   . Hyperlipidemia   . Interstitial cystitis   . Sarcoidosis of lung ([place])    DX 2006--  PULMOLOGIST--  DR [facility]  . Seasonal allergic rhinitis   . Shortness of breath     Past Surgical History:  Procedure Laterality Date  . CYSTO WITH HYDRODISTENSION N/A 08/24/2013   Procedure: CYSTOSCOPY/HYDRODISTENSION with instillation of marcaine and pyridium;  Surgeon: Fredricka Bonine, MD;  Location: [facility];  Service: Urology;  Laterality: N/A;  . CYSTOSCOPY/ HYDRODISTENTION/ BLADDER BX  06-09-2010  . DILATION AND CURETTAGE OF UTERUS  1997  . IR TRANSCATHETER BX  06/14/2018  . IR US GUIDE VASC ACCESS RIGHT  06/14/2018  . IR VENOGRAM HEPATIC W HEMODYNAMIC EVALUATION  06/14/2018    Family History  Problem Relation Age of Onset  . Brain cancer Maternal Grandmother   . Hyperlipidemia Other   . Sleep apnea Other   . Depression Maternal Aunt   . Seizures Maternal Uncle     Social History   Socioeconomic History  . Marital status: Single    Spouse name: Not on file  . Number of children: 0  . Years of education: Not  on file  . Highest education level: Not on file  Occupational History  . Occupation: disabled  Social Needs  . Financial resource strain: Not on file  . Food insecurity:    Worry: Not on file    Inability: Not on file  . Transportation needs:    Medical: Not on file    Non-medical: Not on file  Tobacco Use  . Smoking status: Former Smoker    Packs/day: 0.30    Years: 6.00    Pack years: 1.80    Types: Cigarettes, Cigars    Last attempt to quit: 11/2016    Years since quitting: 1.7  . Smokeless tobacco: Never Used  . Tobacco comment: ONE CIG. DAILY /  1 PPMONTH  Substance and Sexual Activity  . Alcohol use: No    Alcohol/week: 0.0 standard drinks  . Drug use: No    Comment: HX MARIJUANA USE  . Sexual activity: Never    Birth control/protection: None    Comment: abstinence   Lifestyle  . Physical  activity:    Days per week: Not on file    Minutes per session: Not on file  . Stress: Not on file  Relationships  . Social connections:    Talks on phone: Not on file    Gets together: Not on file    Attends religious service: Not on file    Active member of club or organization: Not on file    Attends meetings of clubs or organizations: Not on file    Relationship status: Not on file  . Intimate partner violence:    Fear of current or ex partner: Not on file    Emotionally abused: Not on file    Physically abused: Not on file    Forced sexual activity: Not on file  Other Topics Concern  . Not on file  Social History Narrative  . Not on file      Review of systems: Review of Systems  Constitutional: Negative for fever and chills.  Positive for night sweats and lack of energy HENT: Positive for runny nose Eyes: Negative for blurred vision.  Respiratory: Positive for cough, shortness of breath and wheezing.   Cardiovascular: Negative for chest pain and palpitations.  Gastrointestinal: as per HPI Genitourinary: Negative for dysuria, urgency, frequency and  hematuria.  Musculoskeletal: Positive for myalgias, back pain and joint pain.  Skin: Negative for itching and rash.  Neurological: Negative for dizziness, tremors, focal weakness, seizures and loss of consciousness.  Endo/Heme/Allergies: Positive for seasonal allergies.  Psychiatric/Behavioral: Negative for suicidal ideas and hallucinations.  Positive for depression, anxiety and mood swings All other systems reviewed and are negative.   Physical Exam: Vitals:   09/08/18 1506  BP: 104/74  Pulse: 70   Body mass index is 32.5 kg/m. Gen:      No acute distress HEENT:  EOMI, sclera anicteric Neck:     No masses; no thyromegaly Lungs:    Clear to auscultation bilaterally; normal respiratory effort CV:         Regular rate and rhythm; no murmurs Abd:      + bowel sounds; soft, mild left upper quadrant-tenderness; mild distension, palpable left lobe of liver and spleen Ext:    No edema; adequate peripheral perfusion Skin:      Warm and dry; no rash Neuro: alert and oriented x 3 Psych: normal mood and affect  Data Reviewed:  Reviewed labs, radiology imaging, old records and pertinent past GI work up   Assessment and Plan/Recommendations: 42 year old female with history of systemic sarcoidosis with involvement of lung, liver, spleen, skin and joint, initiated on methotrexate 2-3 months ago with complaints of worsening rectal bleeding, mostly small-volume bright red blood per rectum when she wipes or coating the stool.  Patient has been on chronic steroids on and off for many years now and was completely weaned off prednisone about 2 months ago after she was initiated on methotrexate. She is also having irregular bowel habits with constipation and intermittent diarrhea. We will schedule for colonoscopy to evaluate for possible etiology and will need to exclude inflammatory bowel disease.  If secondary to internal hemorrhoids will consider hemorrhoidal band ligation after colonoscopy. Start  Benefiber 1 teaspoon 3 times daily with meals. Increase dietary fiber and fluid intake. Anusol suppository at bedtime as needed.  Advised patient to avoid excessive straining. The risks and benefits as well as alternatives of endoscopic procedure(s) have been discussed and reviewed. All questions answered. The patient agrees to proceed.     Damaris Hippo ,  MD [phone]    CC: Dorena Dew, FNP

## 2018-09-08 NOTE — Patient Instructions (Addendum)
You have been scheduled for a colonoscopy. Please follow written instructions given to you at your visit today.  Please pick up your prep supplies at the pharmacy within the next 1-3 days. If you use inhalers (even only as needed), please bring them with you on the day of your procedure.  Take benefiber 1 teaspoon three times a day with meals  Plenvu sample kit given   We will send Anusol suppositories to your pharmacy to use at bedtime daily as needed   If you are age 72 or older, your body mass index should be between 23-30. Your Body mass index is 32.5 kg/m. If this is out of the aforementioned range listed, please consider follow up with your Primary Care Provider.  If you are age 43 or younger, your body mass index should be between 19-25. Your Body mass index is 32.5 kg/m. If this is out of the aformentioned range listed, please consider follow up with your Primary Care Provider.    Thank you for choosing [facility]  Karleen Hampshire Nandigam,MD

## 2018-09-11 ENCOUNTER — Encounter: Payer: Self-pay | Admitting: Gastroenterology

## 2018-09-21 ENCOUNTER — Ambulatory Visit (AMBULATORY_SURGERY_CENTER): Payer: Medicare Other | Admitting: Gastroenterology

## 2018-09-21 ENCOUNTER — Encounter: Payer: Self-pay | Admitting: Gastroenterology

## 2018-09-21 VITALS — BP 116/73 | HR 67 | Temp 97.5°F | Resp 26 | Ht 67.0 in | Wt 207.0 lb

## 2018-09-21 DIAGNOSIS — K625 Hemorrhage of anus and rectum: Secondary | ICD-10-CM

## 2018-09-21 DIAGNOSIS — D128 Benign neoplasm of rectum: Secondary | ICD-10-CM

## 2018-09-21 DIAGNOSIS — D869 Sarcoidosis, unspecified: Secondary | ICD-10-CM

## 2018-09-21 MED ORDER — SODIUM CHLORIDE 0.9 % IV SOLN: 500.0000 mL | Freq: Once | INTRAVENOUS | Status: DC

## 2018-09-21 MED ORDER — ANALPRAM HC 2.5-1 % RE CREA: 1.0000 "application " | TOPICAL_CREAM | Freq: Two times a day (BID) | RECTAL | 0 refills | Status: DC

## 2018-09-21 NOTE — Patient Instructions (Signed)
Thank you for allowing [place] to care for you today!  Resume previous diet and medications.  Await pathology results by mail, 1-2 weeks.  Next colonoscopy will be noted on pathology letter.  Prescription for Analpram sent to your pharmacy.  Return to normal activities tomorrow.  Use Benefiber one teaspoon dissolved n a beverage of your choice three times a day.  Return to GI clinic for hemorrhoidal band ligation.  Office will call to set this appointment up.   Handouts provided for hemorrhoidal banding and polyps.     YOU HAD AN ENDOSCOPIC PROCEDURE TODAY AT [facility]:   Refer to the procedure report that was given to you for any specific questions about what was found during the examination.  If the procedure report does not answer your questions, please call your gastroenterologist to clarify.  If you requested that your care partner not be given the details of your procedure findings, then the procedure report has been included in a sealed envelope for you to review at your convenience later.  YOU SHOULD EXPECT: Some feelings of bloating in the abdomen. Passage of more gas than usual.  Walking can help get rid of the air that was put into your GI tract during the procedure and reduce the bloating. If you had a lower endoscopy (such as a colonoscopy or flexible sigmoidoscopy) you may notice spotting of blood in your stool or on the toilet paper. If you underwent a bowel prep for your procedure, you may not have a normal bowel movement for a few days.  Please Note:  You might notice some irritation and congestion in your nose or some drainage.  This is from the oxygen used during your procedure.  There is no need for concern and it should clear up in a day or so.  SYMPTOMS TO REPORT IMMEDIATELY:   Following lower endoscopy (colonoscopy or flexible sigmoidoscopy):  Excessive amounts of blood in the stool  Significant tenderness or worsening of abdominal pains  Swelling  of the abdomen that is new, acute  Fever of 100F or higher   For urgent or emergent issues, a gastroenterologist can be reached at any hour by calling [phone].   DIET:  We do recommend a small meal at first, but then you may proceed to your regular diet.  Drink plenty of fluids but you should avoid alcoholic beverages for 24 hours.  ACTIVITY:  You should plan to take it easy for the rest of today and you should NOT DRIVE or use heavy machinery until tomorrow (because of the sedation medicines used during the test).    FOLLOW UP: Our staff will call the number listed on your records the next business day following your procedure to check on you and address any questions or concerns that you may have regarding the information given to you following your procedure. If we do not reach you, we will leave a message.  However, if you are feeling well and you are not experiencing any problems, there is no need to return our call.  We will assume that you have returned to your regular daily activities without incident.  If any biopsies were taken you will be contacted by phone or by letter within the next 1-3 weeks.  Please call us at [phone] if you have not heard about the biopsies in 3 weeks.    SIGNATURES/CONFIDENTIALITY: You and/or your care partner have signed paperwork which will be entered into your electronic medical record.  These signatures attest to the fact that that the information above on your After Visit Summary has been reviewed and is understood.  Full responsibility of the confidentiality of this discharge information lies with you and/or your care-partner.

## 2018-09-21 NOTE — Progress Notes (Signed)
Report given to PACU, vss 

## 2018-09-21 NOTE — Progress Notes (Signed)
Called to room to assist during endoscopic procedure.  Patient ID and intended procedure confirmed with present staff. Received instructions for my participation in the procedure from the performing physician.  

## 2018-09-21 NOTE — Op Note (Signed)
[facility] Patient Name: Rhonda Hester Procedure Date: 09/21/2018 9:56 AM MRN: [id number] Endoscopist: Mauri Pole , MD Age: 42 Referring MD:  Date of Birth: Jul 11, 1976 Gender: Female Account #: [id number] Procedure:                Colonoscopy Indications:              Evaluation of unexplained GI bleeding. History of                            systemic sarcoidosis Medicines:                Monitored Anesthesia Care Procedure:                Pre-Anesthesia Assessment:                           - Prior to the procedure, a History and Physical                            was performed, and patient medications and                            allergies were reviewed. The patient's tolerance of                            previous anesthesia was also reviewed. The risks                            and benefits of the procedure and the sedation                            options and risks were discussed with the patient.                            All questions were answered, and informed consent                            was obtained. Prior Anticoagulants: The patient has                            taken no previous anticoagulant or antiplatelet                            agents. ASA Grade Assessment: III - A patient with                            severe systemic disease. After reviewing the risks                            and benefits, the patient was deemed in                            satisfactory condition to undergo the procedure.  After obtaining informed consent, the colonoscope                            was passed under direct vision. Throughout the                            procedure, the patient's blood pressure, pulse, and                            oxygen saturations were monitored continuously. The                            Colonoscope was introduced through the anus and                            advanced to the the terminal  ileum, with                            identification of the appendiceal orifice and IC                            valve. The colonoscopy was performed without                            difficulty. The patient tolerated the procedure                            well. The quality of the bowel preparation was                            excellent. The terminal ileum, ileocecal valve,                            appendiceal orifice, and rectum were photographed. Scope In: 10:13:55 AM Scope Out: 10:32:43 AM Scope Withdrawal Time: 0 hours 13 minutes 35 seconds  Total Procedure Duration: 0 hours 18 minutes 48 seconds  Findings:                 The perianal and digital rectal examinations were                            normal.                           Normal mucosa was found in the entire colon.                            Biopsies were taken with a cold forceps for                            histology.                           A 7 mm polyp was found in the rectum. The polyp was  pedunculated. The polyp was removed with a hot                            snare. Resection and retrieval were complete.                           Internal hemorrhoids were found during                            retroflexion. The hemorrhoids were medium-sized.                           The terminal ileum appeared normal. Complications:            No immediate complications. Estimated Blood Loss:     Estimated blood loss was minimal. Impression:               - Normal mucosa in the entire examined colon.                            Biopsied.                           - One 7 mm polyp in the rectum, removed with a hot                            snare. Resected and retrieved.                           - Internal hemorrhoids.                           - The examined portion of the ileum was normal. Recommendation:           - Patient has a contact number available for                             emergencies. The signs and symptoms of potential                            delayed complications were discussed with the                            patient. Return to normal activities tomorrow.                            Written discharge instructions were provided to the                            patient.                           - Resume previous diet.                           - Continue present medications.                           -  Await pathology results.                           - Repeat colonoscopy in 5-10 years for surveillance                            based on pathology results.                           - Return to GI clinic at the next available                            appointment for hemorrhoidal band ligation.                           - Use Benefiber one teaspoon PO TID.                           - Use Analpram HC Cream 2.5%: Apply externally BID                            X 7-10 days. Mauri Pole, MD 09/21/2018 10:41:02 AM This report has been signed electronically.

## 2018-09-21 NOTE — Progress Notes (Signed)
Pt unable to give me accurate information when questioned about medication,she.stated "I am not going to take all that medication,it's too many and they are not going to tell me that I am depressed because I stay in the house and to myself".pt. Also denied any type of breast surgery and informed us that we may use either arm for I.V. Sticks and blood pressure.Made CRNA that pt. Had 8oz of fluid to drink this a.m.

## 2018-09-22 ENCOUNTER — Telehealth: Payer: Self-pay | Admitting: *Deleted

## 2018-09-22 NOTE — Telephone Encounter (Signed)
Ok, will call patient. She can use preparation H OTC. Thanks

## 2018-09-22 NOTE — Telephone Encounter (Signed)
  Follow up Call-  Call back number 09/21/2018  Post procedure Call Back phone  # [phone]  Permission to leave phone message Yes  Some recent data might be hidden     Patient questions:  Do you have a fever, pain , or abdominal swelling? No. Pain Score  0 *  Have you tolerated food without any problems? Yes.    Have you been able to return to your normal activities? Yes.    Do you have any questions about your discharge instructions: Diet   No. Medications  Yes.   Follow up visit  No.  Do you have questions or concerns about your Care? No.  Actions: * If pain score is 4 or above: No action needed, pain <4.  Dr. Silverio Decamp,  This pt states that the RX for the Analpram cream is too expensive.  Is there anything else she can have that may be less expensive?  Cyril Mourning

## 2018-09-22 NOTE — Telephone Encounter (Signed)
Called patient and advised her to use the Anusol suppositories for 1 week.  Will need appointment for hemorrhoidal band ligation, next available. Thanks

## 2018-09-26 NOTE — Telephone Encounter (Signed)
Contacted the patient. She agrees to 10/03/18 at 3:15 pm. States she does not take blood thinners.

## 2018-09-27 ENCOUNTER — Encounter: Payer: Self-pay | Admitting: Gastroenterology

## 2018-10-03 ENCOUNTER — Ambulatory Visit (INDEPENDENT_AMBULATORY_CARE_PROVIDER_SITE_OTHER): Payer: Medicare Other | Admitting: Gastroenterology

## 2018-10-03 ENCOUNTER — Encounter: Payer: Self-pay | Admitting: Gastroenterology

## 2018-10-03 VITALS — BP 102/80 | HR 69 | Ht 67.5 in | Wt 209.0 lb

## 2018-10-03 DIAGNOSIS — K641 Second degree hemorrhoids: Secondary | ICD-10-CM | POA: Diagnosis not present

## 2018-10-03 DIAGNOSIS — K642 Third degree hemorrhoids: Secondary | ICD-10-CM | POA: Diagnosis not present

## 2018-10-03 NOTE — Patient Instructions (Signed)
We will refer you to see Leighton Ruff ,MD for hemorrhoidal surgery they will contact you with that appointment  Use Miralax 1 capful twice daily  Increase water intake to 10-12 cups per day  Use Benefiber 1 teaspoon three times a day   Hemorrhoids Hemorrhoids are swollen veins in and around the rectum or anus. Hemorrhoids can cause pain, itching, or bleeding. Most of the time, they do not cause serious problems. They usually get better with diet changes, lifestyle changes, and other home treatments. Follow these instructions at home: Eating and drinking  Eat foods that have fiber, such as whole grains, beans, nuts, fruits, and vegetables. Ask your doctor about taking products that have added fiber (fibersupplements).  Drink enough fluid to keep your pee (urine) clear or pale yellow. For Pain and Swelling  Take a warm-water bath (sitz bath) for 20 minutes to ease pain. Do this 3-4 times a day.  If directed, put ice on the painful area. It may be helpful to use ice between your warm baths. ? Put ice in a plastic bag. ? Place a towel between your skin and the bag. ? Leave the ice on for 20 minutes, 2-3 times a day. General instructions  Take over-the-counter and prescription medicines only as told by your doctor. ? Medicated creams and medicines that are inserted into the anus (suppositories) may be used or applied as told.  Exercise often.  Go to the bathroom when you have the urge to poop (to have a bowel movement). Do not wait.  Avoid pushing too hard (straining) when you poop.  Keep the butt area dry and clean. Use wet toilet paper or moist paper towels.  Do not sit on the toilet for a long time. Contact a doctor if:  You have any of these: ? Pain and swelling that do not get better with treatment or medicine. ? Bleeding that will not stop. ? Trouble pooping or you cannot poop. ? Pain or swelling outside the area of the hemorrhoids. This information is not intended to  replace advice given to you by your health care provider. Make sure you discuss any questions you have with your health care provider. Document Released: 09/21/2008 Document Revised: 05/20/2016 Document Reviewed: 08/27/2015 Elsevier Interactive Patient Education  2018 [employer].    Constipation, Adult Constipation is when a person:  Poops (has a bowel movement) fewer times in a week than normal.  Has a hard time pooping.  Has poop that is dry, hard, or bigger than normal.  Follow these instructions at home: Eating and drinking   Eat foods that have a lot of fiber, such as: ? Fresh fruits and vegetables. ? Whole grains. ? Beans.  Eat less of foods that are high in fat, low in fiber, or overly processed, such as: ? [place] fries. ? Hamburgers. ? Cookies. ? Candy. ? Soda.  Drink enough fluid to keep your pee (urine) clear or pale yellow. General instructions  Exercise regularly or as told by your doctor.  Go to the restroom when you feel like you need to poop. Do not hold it in.  Take over-the-counter and prescription medicines only as told by your doctor. These include any fiber supplements.  Do pelvic floor retraining exercises, such as: ? Doing deep breathing while relaxing your lower belly (abdomen). ? Relaxing your pelvic floor while pooping.  Watch your condition for any changes.  Keep all follow-up visits as told by your doctor. This is important. Contact a doctor if:  You have pain that gets worse.  You have a fever.  You have not pooped for 4 days.  You throw up (vomit).  You are not hungry.  You lose weight.  You are bleeding from the anus.  You have thin, pencil-like poop (stool). Get help right away if:  You have a fever, and your symptoms suddenly get worse.  You leak poop or have blood in your poop.  Your belly feels hard or bigger than normal (is bloated).  You have very bad belly pain.  You feel dizzy or you faint. This  information is not intended to replace advice given to you by your health care provider. Make sure you discuss any questions you have with your health care provider. Document Released: 05/31/2008 Document Revised: 07/02/2016 Document Reviewed: 06/02/2016 Elsevier Interactive Patient Education  2018 [employer].

## 2018-10-03 NOTE — Progress Notes (Signed)
PROCEDURE NOTE: The patient presents with symptomatic grade II-III  hemorrhoids, requesting rubber band ligation of his/her hemorrhoidal disease.  All risks, benefits and alternative forms of therapy were described and informed consent was obtained.  In the Left Lateral Decubitus position anoscopic examination revealed grade II hemorrhoids in the left lateral, right anterior and right posterior position(s) with active oozing of blood from left lateral and right anterior hemorrhoid.  Hard stool in rectal vault on digital exam. The anorectum was pre-medicated with 0.125% nitroglycerin and RectiCare The decision was made to band the left lateral internal hemorrhoid, and the [place] was used to perform band ligation without complication.  Patient complaining of pain and did not tolerate banding. Digital anorectal examination was then performed to assure proper positioning of the band, and the band was removed as patient did not tolerate any adjustment of the band.    The patient was discharged home without pain or other issues.  Dietary and behavioral recommendations were given and along with follow-up instructions.     The following adjunctive treatments were recommended: Start MiraLAX 1 capful twice daily and titrate as needed to have 1-2 soft bowel movements daily Avoid excessive straining and sitting on toilet for greater than 2 to 3 minutes Benefiber 1 teaspoon 3 times daily with meals Increase dietary fiber intake and 10 to 12 cups of water daily Will refer to Dr. Leighton Ruff for hemorrhoidectomy   Raliegh Ip Denzil Magnuson , MD [phone]

## 2018-10-05 ENCOUNTER — Telehealth: Payer: Self-pay | Admitting: *Deleted

## 2018-10-05 NOTE — Telephone Encounter (Signed)
Faxed referral to [facility] on 10/04/2018 for hemorrhoid surgery for pt  Waiting on response

## 2018-10-17 ENCOUNTER — Telehealth (HOSPITAL_COMMUNITY): Payer: Self-pay | Admitting: *Deleted

## 2018-10-17 NOTE — Telephone Encounter (Signed)
Received referral for pt to participate in pulmonary rehab from Dr. Eliezer Champagne from [facility] [place] [facility].  Reviewed pt medical history in care everywhere.  Clinical review of pt follow up appt on 08/22/18-  Pulmonary office note.   Pt appropriate for scheduling for Pulmonary rehab.  Will forward to support staff for scheduling and verification of insurance eligibility/benefits with pt  consent. Cherre Huger, BSN Cardiac and Training and development officer

## 2018-10-19 NOTE — Telephone Encounter (Signed)
Note on Dr Jillyn Hidden desk for review  Was seen at [place] on 10/17/2018

## 2018-10-30 ENCOUNTER — Encounter: Payer: Self-pay | Admitting: Gastroenterology

## 2018-11-06 ENCOUNTER — Telehealth (HOSPITAL_COMMUNITY): Payer: Self-pay

## 2018-11-06 NOTE — Telephone Encounter (Signed)
Called and spoke with patient in regards to Pulmonary rehab - scheduled orientation on 12/01/18 at 9:30am. Pt will attend the 1:30pm exc class. Mailed packet.

## 2018-11-08 ENCOUNTER — Ambulatory Visit: Payer: Self-pay | Admitting: Family Medicine

## 2018-11-11 ENCOUNTER — Emergency Department (HOSPITAL_COMMUNITY)
Admission: EM | Admit: 2018-11-11 | Discharge: 2018-11-11 | Disposition: A | Discharge status: Home / Self Care | Payer: Medicare Other | Attending: Emergency Medicine | Admitting: Emergency Medicine

## 2018-11-11 ENCOUNTER — Encounter (HOSPITAL_COMMUNITY): Payer: Self-pay

## 2018-11-11 ENCOUNTER — Other Ambulatory Visit: Payer: Self-pay

## 2018-11-11 ENCOUNTER — Emergency Department (HOSPITAL_COMMUNITY): Payer: Medicare Other

## 2018-11-11 DIAGNOSIS — J45909 Unspecified asthma, uncomplicated: Secondary | ICD-10-CM | POA: Insufficient documentation

## 2018-11-11 DIAGNOSIS — R319 Hematuria, unspecified: Secondary | ICD-10-CM | POA: Diagnosis present

## 2018-11-11 DIAGNOSIS — N390 Urinary tract infection, site not specified: Secondary | ICD-10-CM | POA: Diagnosis not present

## 2018-11-11 DIAGNOSIS — Z79899 Other long term (current) drug therapy: Secondary | ICD-10-CM | POA: Insufficient documentation

## 2018-11-11 DIAGNOSIS — Z87891 Personal history of nicotine dependence: Secondary | ICD-10-CM | POA: Diagnosis not present

## 2018-11-11 DIAGNOSIS — N200 Calculus of kidney: Secondary | ICD-10-CM | POA: Insufficient documentation

## 2018-11-11 LAB — URINALYSIS, ROUTINE W REFLEX MICROSCOPIC
GLUCOSE, UA: NEGATIVE mg/dL
Ketones, ur: NEGATIVE mg/dL
NITRITE: POSITIVE — AB
Protein, ur: 100 mg/dL — AB
SPECIFIC GRAVITY, URINE: 1.015 (ref 1.005–1.030)
pH: 7.5 (ref 5.0–8.0)

## 2018-11-11 LAB — COMPREHENSIVE METABOLIC PANEL
ALBUMIN: 3.4 g/dL — AB (ref 3.5–5.0)
ALK PHOS: 195 U/L — AB (ref 38–126)
ALT: 45 U/L — ABNORMAL HIGH (ref 0–44)
ANION GAP: 9 (ref 5–15)
AST: 40 U/L (ref 15–41)
BUN: 7 mg/dL (ref 6–20)
CO2: 25 mmol/L (ref 22–32)
Calcium: 8.9 mg/dL (ref 8.9–10.3)
Chloride: 98 mmol/L (ref 98–111)
Creatinine, Ser: 0.67 mg/dL (ref 0.44–1.00)
GFR calc non Af Amer: >60 mL/min (ref >60)
GLUCOSE: 107 mg/dL — AB (ref 70–99)
Potassium: 3.6 mmol/L (ref 3.5–5.1)
SODIUM: 132 mmol/L — AB (ref 135–145)
Total Bilirubin: 1.2 mg/dL (ref 0.3–1.2)
Total Protein: 8.2 g/dL — ABNORMAL HIGH (ref 6.5–8.1)

## 2018-11-11 LAB — CBC WITH DIFFERENTIAL/PLATELET
Abs Immature Granulocytes: 0.01 10*3/uL (ref 0.00–0.07)
BASOS ABS: 0 10*3/uL (ref 0.0–0.1)
BASOS PCT: 1 %
EOS ABS: 0.3 10*3/uL (ref 0.0–0.5)
EOS PCT: 7 %
HEMATOCRIT: 37.8 % (ref 36.0–46.0)
Hemoglobin: 11.4 g/dL — ABNORMAL LOW (ref 12.0–15.0)
Immature Granulocytes: 0 %
Lymphocytes Relative: 26 %
Lymphs Abs: 1.3 10*3/uL (ref 0.7–4.0)
MCH: 25.9 pg — ABNORMAL LOW (ref 26.0–34.0)
MCHC: 30.2 g/dL (ref 30.0–36.0)
MCV: 85.9 fL (ref 80.0–100.0)
Monocytes Absolute: 0.5 10*3/uL (ref 0.1–1.0)
Monocytes Relative: 10 %
NEUTROS PCT: 56 %
NRBC: 0 % (ref 0.0–0.2)
Neutro Abs: 2.7 10*3/uL (ref 1.7–7.7)
PLATELETS: 282 10*3/uL (ref 150–400)
RBC: 4.4 MIL/uL (ref 3.87–5.11)
RDW: 15 % (ref 11.5–15.5)
WBC: 4.9 10*3/uL (ref 4.0–10.5)

## 2018-11-11 LAB — URINALYSIS, MICROSCOPIC (REFLEX)
BACTERIA UA: NONE SEEN
RBC / HPF: >50 RBC/hpf (ref 0–5)
WBC, UA: >50 WBC/hpf (ref 0–5)

## 2018-11-11 LAB — POC URINE PREG, ED: Preg Test, Ur: NEGATIVE

## 2018-11-11 MED ORDER — SODIUM CHLORIDE 0.9 % IV SOLN
1.0000 g | Freq: Once | INTRAVENOUS | Status: AC
  Administered 2018-11-11: 1 g via INTRAVENOUS
  Filled 2018-11-11: qty 10

## 2018-11-11 MED ORDER — CIPROFLOXACIN HCL 500 MG PO TABS: 500.0000 mg | ORAL_TABLET | Freq: Two times a day (BID) | ORAL | 0 refills | Status: DC

## 2018-11-11 MED ORDER — KETOROLAC TROMETHAMINE 15 MG/ML IJ SOLN
15.0000 mg | Freq: Once | INTRAMUSCULAR | Status: AC
  Administered 2018-11-11: 15 mg via INTRAVENOUS
  Filled 2018-11-11: qty 1

## 2018-11-11 NOTE — Discharge Instructions (Signed)
During this visit you are urine was concerning for urinary tract infection.  You were also found to have a right-sided kidney stone.  I spoke with the urologist that was on-call who recommends you be discharged at this time and follow-up on Monday in their office.  If you should begin to develop significant worsening of your symptoms or fever at home please return immediately to the emergency department for further evaluation.

## 2018-11-11 NOTE — ED Triage Notes (Signed)
Pt states that she has been having lower right and left abdominal pain with blood in her urine today.

## 2018-11-11 NOTE — ED Notes (Signed)
[place] bloody urine

## 2018-11-11 NOTE — ED Notes (Signed)
Her pain is better

## 2018-11-11 NOTE — ED Notes (Signed)
abd pain  For several days  Bloody urine also

## 2018-11-11 NOTE — ED Provider Notes (Signed)
[facility] EMERGENCY DEPARTMENT Provider Note   CSN: [id number] Arrival date & time: 11/11/18  1902     History   Chief Complaint Chief Complaint  Patient presents with  . Hematuria    HPI Rhonda Hester is a 42 y.o. female.  HPI Patient is a 42 year old female with past medical history of sarcoidosis, interstitial cystitis, hyperlipidemia, GERD, and asthma who presents to the emergency department for evaluation of lower abdominal pain and hematuria.  Patient reports that her suprapubic abdominal pain began on 11/8.  Since that time she has had constant pain in her lower abdomen.  States that it is worsened with palpation over the area.  Intermittently she states that the pain will radiate into her urethra.  Describes the pain as dull and achy in nature and present constantly.  States that for the past 2 days she is noticed that her urine has been much darker than normal.  States that over the same time.  She has had significantly increased frequency of urination.  Today she is concerned because she is now starting to develop right flank pain and yesterday had one episode of vomiting.  She spoke with her PCPs on-call nurse who encouraged her to come to the emergency department for further evaluation.  She denies any fevers, chills, or other systemic symptoms at this time.  Remaining review of systems as below.  Past Medical History:  Diagnosis Date  . Allergic asthma   . Allergy    SEASONAL  . Anxiety   . Breast mass 05/2017   lt breast lump  . Eczema   . GERD (gastroesophageal reflux disease)   . History of anal fissures   . History of Bell's palsy    2006  RIGHT SIDE--  RESOLVED  . History of gastroesophageal reflux (GERD)   . Hyperlipidemia   . Interstitial cystitis   . Sarcoidosis of lung ([place])    DX 2006--  PULMOLOGIST--  DR KDXI  . Seasonal allergic rhinitis   . Shortness of breath     Patient Active Problem List   Diagnosis Date Noted  .  Enlarged submental lymph node 03/30/2018  . Nodule of soft tissue 03/22/2018  . Hemorrhoids 02/13/2018  . TMJ (temporomandibular joint disorder) 12/23/2017  . Acute pain of right shoulder 11/26/2017  . Chronic bilateral low back pain without sciatica 08/01/2017  . Chronic pain of right knee 08/01/2017  . Depression due to physical illness 04/02/2017  . Pelvic pain 03/28/2017  . Allergic reaction to food 03/19/2017  . Obesity (BMI 30.0-34.9) 03/19/2017  . Generalized pain 03/19/2017  . Metabolic syndrome 33/82/5053  . Medication management 03/19/2017  . Tobacco abuse 01/24/2017  . Uterine fibroid 01/17/2017  . Interstitial cystitis 01/17/2017  . Menorrhagia 12/06/2016  . Acne vulgaris 05/10/2016  . BMI 31.0-31.9,adult 05/10/2016  . Weight gain 05/10/2016  . Right hip pain 10/27/2014  . Sarcoidosis of lung ([place])   . Cervical adenopathy 03/12/2014  . History of smoking 08/07/2013  . Allergic asthma 07/22/2011    Past Surgical History:  Procedure Laterality Date  . CYSTO WITH HYDRODISTENSION N/A 08/24/2013   Procedure: CYSTOSCOPY/HYDRODISTENSION with instillation of marcaine and pyridium;  Surgeon: Fredricka Bonine, MD;  Location: [facility]  Service: Urology;  Laterality: N/A;  . CYSTOSCOPY/ HYDRODISTENTION/ BLADDER BX  06-09-2010  . DILATION AND CURETTAGE OF UTERUS  1997  . IR TRANSCATHETER BX  06/14/2018  . IR US GUIDE VASC ACCESS RIGHT  06/14/2018  . IR  VENOGRAM HEPATIC W HEMODYNAMIC EVALUATION  06/14/2018     OB History    Gravida  1   Para  0   Term  0   Preterm  0   AB  1   Living  0     SAB  1   TAB  0   Ectopic  0   Multiple  0   Live Births               Home Medications    Prior to Admission medications   Medication Sig Start Date End Date Taking? Authorizing Provider  albuterol (PROAIR HFA) 108 (90 Base) MCG/ACT inhaler Inhale 2 puffs into the lungs every 6 (six) hours as needed for shortness of breath. 05/10/18    Dorena Dew, FNP  ANALPRAM HC 2.5-1 % rectal cream Place 1 application rectally 2 (two) times daily. Apply externally to hemorrhoids twice a day for 7-10 days. 09/21/18   Mauri Pole, MD  ciprofloxacin (CIPRO) 500 MG tablet Take 1 tablet (500 mg total) by mouth every 12 (twelve) hours. 11/11/18   Koa Zoeller, Chanda Busing, MD  folic acid (FOLVITE) 1 MG tablet Take 1 mg by mouth daily.    [provider]  hydrocortisone (ANUSOL-HC) 2.5 % rectal cream Place 1 application rectally 2 (two) times daily. Patient not taking: Reported on 09/21/2018 04/25/18   Levin Erp, PA  hydrocortisone (ANUSOL-HC) 25 MG suppository Place 1 suppository (25 mg total) rectally at bedtime. Patient not taking: Reported on 09/21/2018 09/08/18   Mauri Pole, MD  meloxicam (MOBIC) 15 MG tablet Take 15 mg by mouth daily. 09/14/18   [provider]  methotrexate (RHEUMATREX) 2.5 MG tablet Take 2.5 mg by mouth once a week. Caution:Chemotherapy. Protect from light.    [provider]  metoCLOPramide (REGLAN) 10 MG tablet Take 1 tablet (10 mg total) by mouth every 6 (six) hours. Patient not taking: Reported on 09/21/2018 06/06/18   Long, Wonda Olds, MD  oxyCODONE (OXY IR/ROXICODONE) 5 MG immediate release tablet Take 5 mg by mouth every 6 (six) hours as needed for severe pain.    [provider]    Family History Family History  Problem Relation Age of Onset  . Brain cancer Maternal Grandmother   . Hyperlipidemia Other   . Sleep apnea Other   . Depression Maternal Aunt   . Seizures Maternal Uncle   . Colon cancer Neg Hx   . Stomach cancer Neg Hx     Social History Social History   Tobacco Use  . Smoking status: Former Smoker    Packs/day: 0.30    Years: 6.00    Pack years: 1.80    Types: Cigarettes, Cigars    Last attempt to quit: 11/2016    Years since quitting: 1.9  . Smokeless tobacco: Never Used  . Tobacco comment: ONE CIG. DAILY /  1 PPMONTH  Substance  Use Topics  . Alcohol use: No    Alcohol/week: 0.0 standard drinks  . Drug use: No    Comment: HX MARIJUANA USE     Allergies   Acyclovir and related; Citric acid; Dust mite extract; Grapeseed extract [nutritional supplements]; Pineapple; Pollen extract; and Nickel   Review of Systems Review of Systems  Constitutional: Negative for chills and fever.  HENT: Negative for ear pain and sore throat.   Eyes: Negative for pain and visual disturbance.  Respiratory: Negative for cough and shortness of breath.   Cardiovascular: Negative for chest  pain and palpitations.  Gastrointestinal: Positive for abdominal pain (suprapubic). Negative for vomiting.  Genitourinary: Positive for flank pain (right), frequency, hematuria and urgency. Negative for dysuria.  Musculoskeletal: Negative for arthralgias and back pain.  Skin: Negative for color change and rash.  Neurological: Negative for seizures and syncope.  All other systems reviewed and are negative.    Physical Exam Updated Vital Signs BP (!) 130/94   Pulse 74   Temp 98.2 F (36.8 C) (Oral)   Ht 5\' 7"  (1.702 m)   Wt 92.5 kg   LMP 10/31/2018   SpO2 96%   BMI 31.95 kg/m   Physical Exam  Constitutional: She is oriented to person, place, and time. She appears well-developed and well-nourished. No distress.  HENT:  Head: Normocephalic and atraumatic.  Eyes: Conjunctivae are normal.  Neck: Neck supple.  Cardiovascular: Normal rate and regular rhythm.  Pulmonary/Chest: Effort normal and breath sounds normal.  Abdominal: Soft. There is tenderness (suprapubic and right CVA ).  Musculoskeletal: She exhibits no edema.  Neurological: She is alert and oriented to person, place, and time.  Skin: Skin is warm and dry.  Psychiatric: She has a normal mood and affect.  Nursing note and vitals reviewed.    ED Treatments / Results  Labs (all labs ordered are listed, but only abnormal results are displayed) Labs Reviewed  CBC WITH  DIFFERENTIAL/PLATELET - Abnormal; Notable for the following components:      Result Value   Hemoglobin 11.4 (*)    MCH 25.9 (*)    All other components within normal limits  COMPREHENSIVE METABOLIC PANEL - Abnormal; Notable for the following components:   Sodium 132 (*)    Glucose, Bld 107 (*)    Total Protein 8.2 (*)    Albumin 3.4 (*)    ALT 45 (*)    Alkaline Phosphatase 195 (*)    All other components within normal limits  URINALYSIS, ROUTINE W REFLEX MICROSCOPIC - Abnormal; Notable for the following components:   Color, Urine RED (*)    APPearance CLOUDY (*)    Hgb urine dipstick LARGE (*)    Bilirubin Urine SMALL (*)    Protein, ur 100 (*)    Nitrite POSITIVE (*)    Leukocytes, UA SMALL (*)    All other components within normal limits  URINALYSIS, MICROSCOPIC (REFLEX)  POC URINE PREG, ED    EKG None  Radiology Ct Renal Stone Study  Result Date: 11/11/2018 CLINICAL DATA:  Acute onset of bilateral lower quadrant abdominal pain and hematuria. EXAM: CT ABDOMEN AND PELVIS WITHOUT CONTRAST TECHNIQUE: Multidetector CT imaging of the abdomen and pelvis was performed following the standard protocol without IV contrast. COMPARISON:  Abdominal ultrasound performed 04/05/2018 FINDINGS: Lower chest: Patchy bibasilar atelectasis or scarring is noted. The visualized portions of the mediastinum are unremarkable. Hepatobiliary: The liver is unremarkable in appearance. The gallbladder is unremarkable in appearance. The common bile duct remains normal in caliber. Pancreas: The pancreas is within normal limits. Spleen: The spleen is enlarged, measuring 15.3 cm in length. Adrenals/Urinary Tract: The adrenal glands are unremarkable in appearance. There is a large 9 x 7 mm stone at the right renal pelvis, with mild surrounding soft tissue inflammation, which may be causing some degree of obstruction. There is no evidence of hydronephrosis. No nonobstructing renal stones are identified. No ureteral  stones are seen. No perinephric stranding is seen. Stomach/Bowel: The stomach is unremarkable in appearance. The small bowel is within normal limits. The appendix is normal in  caliber, without evidence of appendicitis. The colon is unremarkable in appearance. Vascular/Lymphatic: The abdominal aorta is unremarkable in appearance. The inferior vena cava is grossly unremarkable. No retroperitoneal lymphadenopathy is seen. No pelvic sidewall lymphadenopathy is identified. Reproductive: The bladder is mildly distended and grossly unremarkable. There is suggestion of fibroids within the uterus. The ovaries are relatively symmetric. No suspicious adnexal masses are seen. Other: No additional soft tissue abnormalities are seen. Musculoskeletal: No acute osseous abnormalities are identified. The visualized musculature is unremarkable in appearance. IMPRESSION: 1. Large 9 x 7 mm stone at the right renal pelvis, with mild surrounding soft tissue inflammation, which may be causing some degree of obstruction. No evidence of hydronephrosis. 2. Splenomegaly. 3. Suggestion of fibroid uterus. 4. Patchy bibasilar atelectasis or scarring noted. Electronically Signed   By: Garald Balding M.D.   On: 11/11/2018 22:40    Procedures Procedures (including critical care time)  Medications Ordered in ED Medications  cefTRIAXone (ROCEPHIN) 1 g in sodium chloride 0.9 % 100 mL IVPB (0 g Intravenous Stopped 11/11/18 2302)  ketorolac (TORADOL) 15 MG/ML injection 15 mg (15 mg Intravenous Given 11/11/18 2204)     Initial Impression / Assessment and Plan / ED Course  I have reviewed the triage vital signs and the nursing notes.  Pertinent labs & imaging results that were available during my care of the patient were reviewed by me and considered in my medical decision making (see chart for details).     Patient is a 41 year old female with a past medical history as detailed above who presents to the emergency department for  evaluation of hematuria.  Patient reports that she has had lower abdominal pain approximately 10 days ago.  States that this is been continuous since the onset, however for the past 2 days she is noticed very dark urine.  She also reports that she began having right-sided flank pain earlier this afternoon.  As a result patient came to the emergency department for further evaluation.  On arrival to the emergency department patient is in no acute distress.  She has mild tenderness to palpation in her suprapubic area as well as mild right CVA tenderness.  As result laboratory and urine studies were obtained.  Patient's lab studies appear close to her baseline, however she does have urine positive for hemoglobin, nitrites, leukocytes, and multiple leukocytes.  Given this finding a CT renal stone study was obtained and patient was given a dose of Rocephin.  Patient's CT renal stone shows a right-sided kidney stone with some evidence of inflammation surrounding the area.  Given this finding I spoke with the on-call urologist Dr. Karsten Ro.  After speaking with him regarding patient's imaging and laboratory findings he believes that she is appropriate for discharge with ciprofloxacin and close follow-up on Monday morning.  He also states that she needs to be given strict return precautions should she develop fever or significant worsening in her symptoms.   Patient's findings were discussed with her in detail.  She is comfortable with discharge at this time with repair precautions given and follow-up plan for Monday.  Patient continues to appear nontoxic and is hemodynamically stable.  Patient in no acute distress at the time of discharge.  The care of this patient was discussed with my attending physician Dr. Ashok Cordia, who voices agreement with work-up and ED disposition.  Final Clinical Impressions(s) / ED Diagnoses   Final diagnoses:  Urinary tract infection with hematuria, site unspecified  Nephrolithiasis     ED Discharge Orders  Ordered    ciprofloxacin (CIPRO) 500 MG tablet  Every 12 hours     11/11/18 2305           Lamika Connolly, Chanda Busing, MD 11/11/18 2317    Lajean Saver, MD 11/12/18 [id number]

## 2018-11-14 ENCOUNTER — Other Ambulatory Visit: Payer: Self-pay | Admitting: Urology

## 2018-11-15 ENCOUNTER — Encounter (HOSPITAL_COMMUNITY): Payer: Self-pay | Admitting: General Practice

## 2018-11-17 NOTE — H&P (Signed)
Office Visit Report     11/14/2018   --------------------------------------------------------------------------------   Rhonda Hester  MRN: [id number]  PRIMARY CARE:    DOB: 02/20/1976, 42 year old Female  REFERRING:  Andrew Au, MD  SSN: -[id number]  PROVIDER:  Festus Aloe, M.D.    TREATING:  Salley Slaughter, P.A.    LOCATION:  [facility] [phone]   --------------------------------------------------------------------------------   CC: I have kidney stones.  HPI: Rhonda Hester is a 42 year-old female patient who was referred by Dr. Andrew Au, MD who is here for renal calculi.  This patient went to the ER on 11/11/18, c/o suprapubic abdominal pain that radiates into her urethra since 11/03/18. She also had one episode of vomiting.   CT Scan showed a 9x7 mm stone at the right renal pelvis with mild surrounding soft tissue inflammation which may be causing some degree of obstruction. No evidence of hydronephrosis. BUN/Cr = 7/0.67; GFR = >60.   She has been taking Ibuprofen, and she is requesting something stronger. She does not tolerate Tylenol well. She is also taking Cipro 500 mg b.i.d prescribed by the ER.   The problem is on the right side.     ALLERGIES: Amitriptyline HCl TABS Elmiron CAPS    MEDICATIONS: Advair Diskus 250 mcg-50 mcg/dose blister, with inhalation device Inhalation  Proair Hfa 90 mcg hfa aerosol with adapter Inhalation  Tramadol Hcl 50 mg tablet 0 Oral Every 6 hours     GU PSH: Cysto Bladder Ureth Biopsy - 2011 Cystoscopy Hydrodistention - 2014, 2011 D&C Non-OB - 2011      PSH Notes: Cystoscopy With Dilation Of Bladder, Cystoscopy With Dilation Of Bladder, Cystoscopy With Biopsy, Dilation And Curettage   NON-GU PSH: None   GU PMH: Interstitial Cystitis (w/o hematuria), Interstitial cystitis - 2017 Oth GU systems Signs/Symptoms, Bladder pain - 2017 Other microscopic hematuria, Microscopic hematuria - 2017 Urinary Tract  Inf, Unspec site, Suspected urinary tract infection - 2017 Pelvic/perineal pain, Abdominal pain, suprapubic - 2014 Urinary Frequency, Increased urinary frequency - 2014      PMH Notes:  2010-06-04 14:37:17 - Note: No Medical Problems   NON-GU PMH: Encounter for general adult medical examination without abnormal findings, Encounter for preventive health examination - 2017 Anxiety, Anxiety (Symptom) - 2014 Muscle weakness (generalized), Muscle weakness - 2014 Other lack of coordination, Other lack of coordination - 2014 Other muscle spasm, Muscle spasm - 2014 Asthma GERD Sarcoidosis, unspecified    FAMILY HISTORY: Family Health Status - Mother's Age - Runs In Family Family Health Status Number - No Family History   SOCIAL HISTORY: None    Notes: Former smoker, Environmental consultant In Usual Daily Activities, Exercise Habits, Living Independently, Activities Of Daily Living, Unemployed, Physical Disability:   REVIEW OF SYSTEMS:    GU Review Female:   Patient denies frequent urination, hard to postpone urination, burning /pain with urination, get up at night to urinate, leakage of urine, stream starts and stops, trouble starting your stream, have to strain to urinate, and being pregnant.  Gastrointestinal (Upper):   Patient denies nausea, vomiting, and indigestion/ heartburn.  Gastrointestinal (Lower):   Patient denies diarrhea and constipation.  Constitutional:   Patient denies fever, night sweats, weight loss, and fatigue.  Skin:   Patient denies skin rash/ lesion and itching.  Eyes:   Patient denies blurred vision and double vision.  Ears/ Nose/ Throat:   Patient denies sore throat and sinus problems.  Hematologic/Lymphatic:   Patient denies swollen glands and easy  bruising.  Cardiovascular:   Patient denies leg swelling and chest pains.  Respiratory:   Patient denies cough and shortness of breath.  Endocrine:   Patient denies excessive thirst.  Musculoskeletal:   Patient denies back pain  and joint pain.  Neurological:   Patient denies headaches and dizziness.  Psychologic:   Patient denies depression and anxiety.   VITAL SIGNS:      11/14/2018 10:49 AM  Weight 209.6 lb / 95.07 kg  Height 67 in / 170.18 cm  BP 116/80 mmHg  Pulse 76 /min  Temperature 98.4 F / 36.8 C  BMI 32.8 kg/m   MULTI-SYSTEM PHYSICAL EXAMINATION:    Constitutional: Well-nourished. No physical deformities. Normally developed. Good grooming.  Neck: Neck symmetrical, not swollen. Normal tracheal position.  Respiratory: No labored breathing, no use of accessory muscles.   Neurologic / Psychiatric: Oriented to time, oriented to place, oriented to person. No depression, no anxiety, no agitation.  Gastrointestinal: No mass, no tenderness, no rigidity, non obese abdomen. + Right abdominal quadrant tenderness. No CVAT.  Musculoskeletal: Normal gait and station of head and neck.     PAST DATA REVIEWED:  Source Of History:  Patient  Records Review:   Previous Hospital Records  X-Ray Review: C.T. Abdomen/Pelvis: Reviewed Films. Reviewed Report. Discussed With Patient.     PROCEDURES:         KUB - [id number]  A single view of the abdomen is obtained. 9x7 mm stone at the right renal pelvis. No obvious calculi within the left renal shadow or throughout expected anatomical tracts of bilateral ureters.                Urinalysis w/Scope Dipstick Dipstick Cont'd Micro  Color: Yellow Bilirubin: Neg mg/dL WBC/hpf: NS (Not Seen)  Appearance: Cloudy Ketones: Neg mg/dL RBC/hpf: 3 - 10/hpf  Specific Gravity: 1.020 Blood: Trace ery/uL Bacteria: Rare (0-9/hpf)  pH: 6.0 Protein: Trace mg/dL Cystals: NS (Not Seen)  Glucose: Neg mg/dL Urobilinogen: 0.2 mg/dL Casts: NS (Not Seen)    Nitrites: Neg Trichomonas: Not Present    Leukocyte Esterase: Neg leu/uL Mucous: Not Present      Epithelial Cells: 6 - 10/hpf      Yeast: NS (Not Seen)      Sperm: Not Present    ASSESSMENT:      ICD-10 Details  1 GU:   Renal  calculus - N20.0    PLAN:            Medications New Meds: Tamsulosin Hcl 0.4 mg capsule 1 capsule PO Daily   #14  0 Refill(s)  Dilaudid 2 mg tablet 1 tablet PO Q 6 H   #6  0 Refill(s)  Dilaudid 2 mg tablet 1 tablet PO Q 6 H PRN   #6  0 Refill(s)            Orders Labs Urine Culture  X-Rays: KUB          Schedule Return Notes: Planning ESWL          Document Letter(s):  Created for Patient: Clinical Summary         Notes:   This patient has a large 9 x 7 mm stone in the right renal pelvis. She is currently stable but she is uncomfortable. She is requesting surgical intervention. We discussed ESWL versus ureteroscopy, and she prefers ESWL. I provided her with a prescription for Dilaudid 2 mg, which she was instucted to take prn pain and to plan on not driving afterward,  as she drove herself here. I also prescribed Flomax. She is currently taking Cipro prescribed at the ER. In light of upcoming surgical intervention, will order a urine culture today. As reviewed with the patient we will plan for ESWL. This was also reviewed with Dr. Junious Silk and he agrees that she is a good candidate. She was also given strict parameters to notify the office if she experiences intolerable pain, fever, chills, nausea, or vomiting.   CC: Dr. Andrew Au.        Next Appointment:      Next Appointment: 11/20/2018 10:00 AM    Appointment Type: Surgery     Location: [facility] [phone]    Provider: Raynelle Bring, M.D.    Reason for Visit: OP WL RT ESWL      * Signed by Salley Slaughter, P.A. on 11/15/18 at 6:41 AM (EST)*

## 2018-11-20 ENCOUNTER — Ambulatory Visit: Payer: Self-pay | Admitting: Family Medicine

## 2018-11-20 ENCOUNTER — Ambulatory Visit (HOSPITAL_COMMUNITY): Admission: RE | Admit: 2018-11-20 | Payer: Medicare Other | Source: Ambulatory Visit | Admitting: Urology

## 2018-11-20 HISTORY — DX: Personal history of urinary calculi: Z87.442

## 2018-11-20 SURGERY — LITHOTRIPSY, ESWL
Anesthesia: LOCAL | Laterality: Right

## 2018-11-21 ENCOUNTER — Other Ambulatory Visit: Payer: Self-pay | Admitting: Urology

## 2018-11-27 ENCOUNTER — Telehealth (HOSPITAL_COMMUNITY): Payer: Self-pay

## 2018-11-29 NOTE — Progress Notes (Signed)
Called patient with time to arrive, bring blue folder, driver and her portable oxygen tank.

## 2018-11-29 NOTE — H&P (Signed)
I have kidney stones.  HPI: Rhonda Hester is a 42 year-old female patient who was referred by Dr. Andrew Au, MD who is here for renal calculi.  This patient went to the ER on 11/11/18, c/o suprapubic abdominal pain that radiates into her urethra since 11/03/18. She also had one episode of vomiting.   CT Scan showed a 9x7 mm stone at the right renal pelvis with mild surrounding soft tissue inflammation which may be causing some degree of obstruction. No evidence of hydronephrosis. BUN/Cr = 7/0.67; GFR = >60.   She has been taking Ibuprofen, and she is requesting something stronger. She does not tolerate Tylenol well. She is also taking Cipro 500 mg b.i.d prescribed by the ER.   The problem is on the right side.     ALLERGIES: Amitriptyline HCl TABS Elmiron CAPS    MEDICATIONS: Advair Diskus 250 mcg-50 mcg/dose blister, with inhalation device Inhalation  Proair Hfa 90 mcg hfa aerosol with adapter Inhalation  Tramadol Hcl 50 mg tablet 0 Oral Every 6 hours     GU PSH: Cysto Bladder Ureth Biopsy - 2011 Cystoscopy Hydrodistention - 2014, 2011 D&C Non-OB - 2011      PSH Notes: Cystoscopy With Dilation Of Bladder, Cystoscopy With Dilation Of Bladder, Cystoscopy With Biopsy, Dilation And Curettage   NON-GU PSH: None   GU PMH: Interstitial Cystitis (w/o hematuria), Interstitial cystitis - 2017 Oth GU systems Signs/Symptoms, Bladder pain - 2017 Other microscopic hematuria, Microscopic hematuria - 2017 Urinary Tract Inf, Unspec site, Suspected urinary tract infection - 2017 Pelvic/perineal pain, Abdominal pain, suprapubic - 2014 Urinary Frequency, Increased urinary frequency - 2014      PMH Notes:  2010-06-04 14:37:17 - Note: No Medical Problems   NON-GU PMH: Encounter for general adult medical examination without abnormal findings, Encounter for preventive health examination - 2017 Anxiety, Anxiety (Symptom) - 2014 Muscle weakness (generalized), Muscle weakness - 2014 Other lack  of coordination, Other lack of coordination - 2014 Other muscle spasm, Muscle spasm - 2014 Asthma GERD Sarcoidosis, unspecified    FAMILY HISTORY: Family Health Status - Mother's Age - Runs In Family Family Health Status Number - No Family History   SOCIAL HISTORY: None    Notes: Former smoker, Environmental consultant In Usual Daily Activities, Exercise Habits, Living Independently, Activities Of Daily Living, Unemployed, Physical Disability:   REVIEW OF SYSTEMS:    GU Review Female:   Patient denies frequent urination, hard to postpone urination, burning /pain with urination, get up at night to urinate, leakage of urine, stream starts and stops, trouble starting your stream, have to strain to urinate, and being pregnant.  Gastrointestinal (Upper):   Patient denies nausea, vomiting, and indigestion/ heartburn.  Gastrointestinal (Lower):   Patient denies diarrhea and constipation.  Constitutional:   Patient denies fever, night sweats, weight loss, and fatigue.  Skin:   Patient denies skin rash/ lesion and itching.  Eyes:   Patient denies blurred vision and double vision.  Ears/ Nose/ Throat:   Patient denies sore throat and sinus problems.  Hematologic/Lymphatic:   Patient denies swollen glands and easy bruising.  Cardiovascular:   Patient denies leg swelling and chest pains.  Respiratory:   Patient denies cough and shortness of breath.  Endocrine:   Patient denies excessive thirst.  Musculoskeletal:   Patient denies back pain and joint pain.  Neurological:   Patient denies headaches and dizziness.  Psychologic:   Patient denies depression and anxiety.   VITAL SIGNS:      11/14/2018 10:49 AM  Weight 209.6 lb / 95.07 kg  Height 67 in / 170.18 cm  BP 116/80 mmHg  Pulse 76 /min  Temperature 98.4 F / 36.8 C  BMI 32.8 kg/m   MULTI-SYSTEM PHYSICAL EXAMINATION:    Constitutional: Well-nourished. No physical deformities. Normally developed. Good grooming.  Neck: Neck symmetrical, not swollen.  Normal tracheal position.  Respiratory: No labored breathing, no use of accessory muscles.   Neurologic / Psychiatric: Oriented to time, oriented to place, oriented to person. No depression, no anxiety, no agitation.  Gastrointestinal: No mass, no tenderness, no rigidity, non obese abdomen. + Right abdominal quadrant tenderness. No CVAT.  Musculoskeletal: Normal gait and station of head and neck.     PAST DATA REVIEWED:  Source Of History:  Patient  Records Review:   Previous Hospital Records  X-Ray Review: C.T. Abdomen/Pelvis: Reviewed Films. Reviewed Report. Discussed With Patient.     PROCEDURES:         KUB - [id number]  A single view of the abdomen is obtained. 9x7 mm stone at the right renal pelvis. No obvious calculi within the left renal shadow or throughout expected anatomical tracts of bilateral ureters.                Urinalysis w/Scope Dipstick Dipstick Cont'd Micro  Color: Yellow Bilirubin: Neg mg/dL WBC/hpf: NS (Not Seen)  Appearance: Cloudy Ketones: Neg mg/dL RBC/hpf: 3 - 10/hpf  Specific Gravity: 1.020 Blood: Trace ery/uL Bacteria: Rare (0-9/hpf)  pH: 6.0 Protein: Trace mg/dL Cystals: NS (Not Seen)  Glucose: Neg mg/dL Urobilinogen: 0.2 mg/dL Casts: NS (Not Seen)    Nitrites: Neg Trichomonas: Not Present    Leukocyte Esterase: Neg leu/uL Mucous: Not Present      Epithelial Cells: 6 - 10/hpf      Yeast: NS (Not Seen)      Sperm: Not Present    ASSESSMENT:      ICD-10 Details  1 GU:   Renal calculus - N20.0    PLAN:            Medications New Meds: Tamsulosin Hcl 0.4 mg capsule 1 capsule PO Daily   #14  0 Refill(s)  Dilaudid 2 mg tablet 1 tablet PO Q 6 H   #6  0 Refill(s)  Dilaudid 2 mg tablet 1 tablet PO Q 6 H PRN   #6  0 Refill(s)            Orders Labs Urine Culture  X-Rays: KUB          Schedule Return Notes: Planning ESWL          Document Letter(s):  Created for Patient: Clinical Summary         Notes:   This patient has a large 9 x 7 mm  stone in the right renal pelvis. She is currently stable but she is uncomfortable. She is requesting surgical intervention. We discussed ESWL versus ureteroscopy, and she prefers ESWL. I provided her with a prescription for Dilaudid 2 mg, which she was instucted to take prn pain and to plan on not driving afterward, as she drove herself here. I also prescribed Flomax. She is currently taking Cipro prescribed at the ER. In light of upcoming surgical intervention, will order a urine culture today. As reviewed with the patient we will plan for ESWL. This was also reviewed with Dr. Junious Silk and he agrees that she is a good candidate. She was also given strict parameters to notify the office if she experiences intolerable pain, fever, chills,  nausea, or vomiting.    After a thorough review of the management options for the patient's condition the patient  elected to proceed with surgical therapy as noted above. We have discussed the potential benefits and risks of the procedure, side effects of the proposed treatment, the likelihood of the patient achieving the goals of the procedure, and any potential problems that might occur during the procedure or recuperation. Informed consent has been obtained.

## 2018-11-30 ENCOUNTER — Encounter (HOSPITAL_COMMUNITY): Payer: Self-pay | Admitting: General Practice

## 2018-11-30 ENCOUNTER — Ambulatory Visit (HOSPITAL_COMMUNITY)
Admission: RE | Admit: 2018-11-30 | Discharge: 2018-11-30 | Disposition: A | Discharge status: Home / Self Care | Payer: Medicare Other | Source: Ambulatory Visit | Attending: Urology | Admitting: Urology

## 2018-11-30 ENCOUNTER — Ambulatory Visit (HOSPITAL_COMMUNITY): Payer: Medicare Other

## 2018-11-30 ENCOUNTER — Encounter (HOSPITAL_COMMUNITY)
Admission: RE | Disposition: A | Discharge status: Home / Self Care | Payer: Self-pay | Source: Ambulatory Visit | Attending: Urology

## 2018-11-30 DIAGNOSIS — Z7951 Long term (current) use of inhaled steroids: Secondary | ICD-10-CM | POA: Diagnosis not present

## 2018-11-30 DIAGNOSIS — N200 Calculus of kidney: Secondary | ICD-10-CM | POA: Diagnosis present

## 2018-11-30 HISTORY — PX: EXTRACORPOREAL SHOCK WAVE LITHOTRIPSY: SHX1557

## 2018-11-30 LAB — PREGNANCY, URINE: PREG TEST UR: NEGATIVE

## 2018-11-30 SURGERY — LITHOTRIPSY, ESWL
Anesthesia: LOCAL | Laterality: Right

## 2018-11-30 MED ORDER — SODIUM CHLORIDE 0.9 % IV SOLN
INTRAVENOUS | Status: DC
  Administered 2018-11-30: 10:00:00 via INTRAVENOUS

## 2018-11-30 MED ORDER — DIPHENHYDRAMINE HCL 25 MG PO CAPS
25.0000 mg | ORAL_CAPSULE | ORAL | Status: AC
  Administered 2018-11-30: 25 mg via ORAL
  Filled 2018-11-30: qty 1

## 2018-11-30 MED ORDER — CIPROFLOXACIN HCL 500 MG PO TABS
500.0000 mg | ORAL_TABLET | ORAL | Status: AC
  Administered 2018-11-30: 500 mg via ORAL
  Filled 2018-11-30: qty 1

## 2018-11-30 MED ORDER — DIAZEPAM 5 MG PO TABS
10.0000 mg | ORAL_TABLET | ORAL | Status: AC
  Administered 2018-11-30 (×2): 5 mg via ORAL
  Filled 2018-11-30: qty 1
  Filled 2018-11-30: qty 2

## 2018-11-30 NOTE — Discharge Instructions (Signed)
I have reviewed discharge instructions in detail with the patient. They will follow-up with me or their physician as scheduled. My nurse will also be calling the patients as per protocol. Dr. MacDiarmid. ° °Lithotripsy, Care After °This sheet gives you information about how to care for yourself after your procedure. Your health care provider may also give you more specific instructions. If you have problems or questions, contact your health care provider. °What can I expect after the procedure? °After the procedure, it is common to have: °· Some blood in your urine. This should only last for a few days. °· Soreness in your back, sides, or upper abdomen for a few days. °· Blotches or bruises on your back where the pressure wave entered the skin. °· Pain, discomfort, or nausea when pieces (fragments) of the kidney stone move through the tube that carries urine from the kidney to the bladder (ureter). Stone fragments may pass soon after the procedure, but they may continue to pass for up to 4-8 weeks. °? If you have severe pain or nausea, contact your health care provider. This may be caused by a large stone that was not broken up, and this may mean that you need more treatment. °· Some pain or discomfort during urination. °· Some pain or discomfort in the lower abdomen or (in men) at the base of the penis. ° °Follow these instructions at home: °Medicines °· Take over-the-counter and prescription medicines only as told by your health care provider. °· If you were prescribed an antibiotic medicine, take it as told by your health care provider. Do not stop taking the antibiotic even if you start to feel better. °· Do not drive for 24 hours if you were given a medicine to help you relax (sedative). °· Do not drive or use heavy machinery while taking prescription pain medicine. °Eating and drinking °· Drink enough water and fluids to keep your urine clear or pale yellow. This helps any remaining pieces of the stone to  pass. It can also help prevent new stones from forming. °· Eat plenty of fresh fruits and vegetables. °· Follow instructions from your health care provider about eating and drinking restrictions. You may be instructed: °? To reduce how much salt (sodium) you eat or drink. Check ingredients and nutrition facts on packaged foods and beverages. °? To reduce how much meat you eat. °· Eat the recommended amount of calcium for your age and gender. Ask your health care provider how much calcium you should have. °General instructions °· Get plenty of rest. °· Most people can resume normal activities 1-2 days after the procedure. Ask your health care provider what activities are safe for you. °· If directed, strain all urine through the strainer that was provided by your health care provider. °? Keep all fragments for your health care provider to see. Any stones that are found may be sent to a medical lab for examination. The stone may be as small as a grain of salt. °· Keep all follow-up visits as told by your health care provider. This is important. °Contact a health care provider if: °· You have pain that is severe or does not get better with medicine. °· You have nausea that is severe or does not go away. °· You have blood in your urine longer than your health care provider told you to expect. °· You have more blood in your urine. °· You have pain during urination that does not go away. °· You urinate more   frequently than usual and this does not go away. °· You develop a rash or any other possible signs of an allergic reaction. °Get help right away if: °· You have severe pain in your back, sides, or upper abdomen. °· You have severe pain while urinating. °· Your urine is very dark red. °· You have blood in your stool (feces). °· You cannot pass any urine at all. °· You feel a strong urge to urinate after emptying your bladder. °· You have a fever or chills. °· You develop shortness of breath, difficulty breathing, or  chest pain. °· You have severe nausea that leads to persistent vomiting. °· You faint. °Summary °· After this procedure, it is common to have some pain, discomfort, or nausea when pieces (fragments) of the kidney stone move through the tube that carries urine from the kidney to the bladder (ureter). If this pain or nausea is severe, however, you should contact your health care provider. °· Most people can resume normal activities 1-2 days after the procedure. Ask your health care provider what activities are safe for you. °· Drink enough water and fluids to keep your urine clear or pale yellow. This helps any remaining pieces of the stone to pass, and it can help prevent new stones from forming. °· If directed, strain your urine and keep all fragments for your health care provider to see. Fragments or stones may be as small as a grain of salt. °· Get help right away if you have severe pain in your back, sides, or upper abdomen or have severe pain while urinating. °This information is not intended to replace advice given to you by your health care provider. Make sure you discuss any questions you have with your health care provider. °Document Released: 01/02/2008 Document Revised: 11/03/2016 Document Reviewed: 11/03/2016 °Elsevier Interactive Patient Education © 2018 [employer] ° °Moderate Conscious Sedation, Adult, Care After °These instructions provide you with information about caring for yourself after your procedure. Your health care provider may also give you more specific instructions. Your treatment has been planned according to current medical practices, but problems sometimes occur. Call your health care provider if you have any problems or questions after your procedure. °What can I expect after the procedure? °After your procedure, it is common: °· To feel sleepy for several hours. °· To feel clumsy and have poor balance for several hours. °· To have poor judgment for several hours. °· To vomit if you  eat too soon. ° °Follow these instructions at home: °For at least 24 hours after the procedure: ° °· Do not: °? Participate in activities where you could fall or become injured. °? Drive. °? Use heavy machinery. °? Drink alcohol. °? Take sleeping pills or medicines that cause drowsiness. °? Make important decisions or sign legal documents. °? Take care of children on your own. °· Rest. °Eating and drinking °· Follow the diet recommended by your health care provider. °· If you vomit: °? Drink water, juice, or soup when you can drink without vomiting. °? Make sure you have little or no nausea before eating solid foods. °General instructions °· Have a responsible adult stay with you until you are awake and alert. °· Take over-the-counter and prescription medicines only as told by your health care provider. °· If you smoke, do not smoke without supervision. °· Keep all follow-up visits as told by your health care provider. This is important. °Contact a health care provider if: °· You keep feeling nauseous or   you keep vomiting. °· You feel light-headed. °· You develop a rash. °· You have a fever. °Get help right away if: °· You have trouble breathing. °This information is not intended to replace advice given to you by your health care provider. Make sure you discuss any questions you have with your health care provider. °Document Released: 10/03/2013 Document Revised: 05/17/2016 Document Reviewed: 04/03/2016 °Elsevier Interactive Patient Education © 2018 [employer] ° °

## 2018-11-30 NOTE — Interval H&P Note (Signed)
History and Physical Interval Note:  11/30/2018 9:58 AM  Rhonda Hester  has presented today for surgery, with the diagnosis of RIGHT RENAL PELVIS KIDNEY CALCULUS  The various methods of treatment have been discussed with the patient and family. After consideration of risks, benefits and other options for treatment, the patient has consented to  Procedure(s): EXTRACORPOREAL SHOCK WAVE LITHOTRIPSY (ESWL) (Right) as a surgical intervention .  The patient's history has been reviewed, patient examined, no change in status, stable for surgery.  I have reviewed the patient's chart and labs.  Questions were answered to the patient's satisfaction.     [name] A

## 2018-12-01 ENCOUNTER — Ambulatory Visit (HOSPITAL_COMMUNITY): Payer: Self-pay

## 2018-12-01 ENCOUNTER — Encounter (HOSPITAL_COMMUNITY): Payer: Self-pay | Admitting: Urology

## 2018-12-06 ENCOUNTER — Telehealth (HOSPITAL_COMMUNITY): Payer: Self-pay

## 2018-12-06 NOTE — Telephone Encounter (Signed)
Attempted to call patient in regards to Pulmonary Rehab - LM on VM °

## 2018-12-06 NOTE — Telephone Encounter (Signed)
Pt returned PR phone and stated she is having surgery on 12/19 and will give [place] a call after that.

## 2018-12-28 ENCOUNTER — Other Ambulatory Visit: Payer: Self-pay | Admitting: Urology

## 2018-12-29 ENCOUNTER — Telehealth (HOSPITAL_COMMUNITY): Payer: Self-pay

## 2018-12-29 NOTE — Telephone Encounter (Signed)
Contacted pt in regards to post op status and follow up for scheduling consideration for Pulmonary rehab. Pt states she has been seen for follow up since her surgery on 11/29/18, but she states she is going to have to have an additional procedure because the first was not successful. Pt states she is to be seen on 01/30/19 for follow up procedure. Informed patient that we would contact her after her procedure for scheduling. Pt verbalized understanding.   Joycelyn Man RN, BSN Cardiac and Pulmonary Rehab RN

## 2019-01-23 NOTE — Progress Notes (Signed)
Spoke with [employer] and made aware patient uses home oxygen and cannot be done at [facility] [place]

## 2019-01-29 ENCOUNTER — Encounter (HOSPITAL_COMMUNITY): Payer: Self-pay | Admitting: *Deleted

## 2019-01-29 ENCOUNTER — Other Ambulatory Visit: Payer: Self-pay

## 2019-01-30 ENCOUNTER — Encounter (HOSPITAL_COMMUNITY)
Admission: RE | Disposition: A | Discharge status: Home / Self Care | Payer: Self-pay | Source: Home / Self Care | Attending: Urology

## 2019-01-30 ENCOUNTER — Ambulatory Visit (HOSPITAL_COMMUNITY)
Admission: RE | Admit: 2019-01-30 | Discharge: 2019-01-30 | Disposition: A | Discharge status: Home / Self Care | Payer: Medicare Other | Attending: Urology | Admitting: Urology

## 2019-01-30 ENCOUNTER — Encounter (HOSPITAL_COMMUNITY): Payer: Self-pay

## 2019-01-30 ENCOUNTER — Ambulatory Visit (HOSPITAL_COMMUNITY): Payer: Medicare Other | Admitting: Anesthesiology

## 2019-01-30 ENCOUNTER — Other Ambulatory Visit: Payer: Self-pay

## 2019-01-30 ENCOUNTER — Ambulatory Visit (HOSPITAL_COMMUNITY): Payer: Medicare Other

## 2019-01-30 DIAGNOSIS — Z79899 Other long term (current) drug therapy: Secondary | ICD-10-CM | POA: Insufficient documentation

## 2019-01-30 DIAGNOSIS — Z87891 Personal history of nicotine dependence: Secondary | ICD-10-CM | POA: Diagnosis not present

## 2019-01-30 DIAGNOSIS — Z6832 Body mass index (BMI) 32.0-32.9, adult: Secondary | ICD-10-CM | POA: Diagnosis not present

## 2019-01-30 DIAGNOSIS — N200 Calculus of kidney: Secondary | ICD-10-CM | POA: Insufficient documentation

## 2019-01-30 DIAGNOSIS — Z888 Allergy status to other drugs, medicaments and biological substances status: Secondary | ICD-10-CM | POA: Diagnosis not present

## 2019-01-30 DIAGNOSIS — E669 Obesity, unspecified: Secondary | ICD-10-CM | POA: Insufficient documentation

## 2019-01-30 DIAGNOSIS — J45909 Unspecified asthma, uncomplicated: Secondary | ICD-10-CM | POA: Insufficient documentation

## 2019-01-30 HISTORY — PX: CYSTOSCOPY/URETEROSCOPY/HOLMIUM LASER/STENT PLACEMENT: SHX6546

## 2019-01-30 LAB — CBC
HCT: 37.6 % (ref 36.0–46.0)
Hemoglobin: 11.2 g/dL — ABNORMAL LOW (ref 12.0–15.0)
MCH: 26.9 pg (ref 26.0–34.0)
MCHC: 29.8 g/dL — ABNORMAL LOW (ref 30.0–36.0)
MCV: 90.4 fL (ref 80.0–100.0)
Platelets: 267 K/uL (ref 150–400)
RBC: 4.16 MIL/uL (ref 3.87–5.11)
RDW: 16.4 % — ABNORMAL HIGH (ref 11.5–15.5)
WBC: 5.3 K/uL (ref 4.0–10.5)
nRBC: 0 % (ref 0.0–0.2)

## 2019-01-30 LAB — HCG, SERUM, QUALITATIVE: Preg, Serum: NEGATIVE

## 2019-01-30 LAB — POCT I-STAT 4, (NA,K, GLUC, HGB,HCT)
Glucose, Bld: 91 mg/dL (ref 70–99)
HCT: 33 % — ABNORMAL LOW (ref 36.0–46.0)
Hemoglobin: 11.2 g/dL — ABNORMAL LOW (ref 12.0–15.0)
Potassium: 3.5 mmol/L (ref 3.5–5.1)
Sodium: 142 mmol/L (ref 135–145)

## 2019-01-30 SURGERY — CYSTOSCOPY/URETEROSCOPY/HOLMIUM LASER/STENT PLACEMENT
Anesthesia: General | Laterality: Right

## 2019-01-30 MED ORDER — ONDANSETRON HCL 4 MG/2ML IJ SOLN
INTRAMUSCULAR | Status: AC
  Filled 2019-01-30: qty 2

## 2019-01-30 MED ORDER — ONDANSETRON HCL 4 MG/2ML IJ SOLN
INTRAMUSCULAR | Status: DC | PRN
  Administered 2019-01-30: 4 mg via INTRAVENOUS

## 2019-01-30 MED ORDER — MIDAZOLAM HCL 5 MG/5ML IJ SOLN
INTRAMUSCULAR | Status: DC | PRN
  Administered 2019-01-30: 2 mg via INTRAVENOUS

## 2019-01-30 MED ORDER — 0.9 % SODIUM CHLORIDE (POUR BTL) OPTIME
TOPICAL | Status: DC | PRN
  Administered 2019-01-30: 500 mL

## 2019-01-30 MED ORDER — LIDOCAINE 2% (20 MG/ML) 5 ML SYRINGE
INTRAMUSCULAR | Status: AC
  Filled 2019-01-30: qty 5

## 2019-01-30 MED ORDER — FENTANYL CITRATE (PF) 100 MCG/2ML IJ SOLN
INTRAMUSCULAR | Status: DC | PRN
  Administered 2019-01-30 (×3): 50 ug via INTRAVENOUS

## 2019-01-30 MED ORDER — FENTANYL CITRATE (PF) 100 MCG/2ML IJ SOLN
INTRAMUSCULAR | Status: AC
  Filled 2019-01-30: qty 2

## 2019-01-30 MED ORDER — IOHEXOL 300 MG/ML  SOLN
INTRAMUSCULAR | Status: DC | PRN
  Administered 2019-01-30: 4 mL

## 2019-01-30 MED ORDER — DEXAMETHASONE SODIUM PHOSPHATE 10 MG/ML IJ SOLN
INTRAMUSCULAR | Status: DC | PRN
  Administered 2019-01-30: 10 mg via INTRAVENOUS

## 2019-01-30 MED ORDER — PROPOFOL 10 MG/ML IV BOLUS
INTRAVENOUS | Status: DC | PRN
  Administered 2019-01-30: 200 mg via INTRAVENOUS

## 2019-01-30 MED ORDER — SODIUM CHLORIDE 0.9 % IR SOLN
Status: DC | PRN
  Administered 2019-01-30: 3000 mL

## 2019-01-30 MED ORDER — PROPOFOL 10 MG/ML IV BOLUS
INTRAVENOUS | Status: AC
  Filled 2019-01-30: qty 20

## 2019-01-30 MED ORDER — CEFAZOLIN SODIUM-DEXTROSE 2-4 GM/100ML-% IV SOLN
2.0000 g | Freq: Once | INTRAVENOUS | Status: AC
  Administered 2019-01-30: 2 g via INTRAVENOUS
  Filled 2019-01-30: qty 100

## 2019-01-30 MED ORDER — FENTANYL CITRATE (PF) 100 MCG/2ML IJ SOLN
25.0000 ug | INTRAMUSCULAR | Status: DC | PRN
  Administered 2019-01-30 (×3): 50 ug via INTRAVENOUS

## 2019-01-30 MED ORDER — FENTANYL CITRATE (PF) 250 MCG/5ML IJ SOLN
INTRAMUSCULAR | Status: AC
  Filled 2019-01-30: qty 5

## 2019-01-30 MED ORDER — MIDAZOLAM HCL 2 MG/2ML IJ SOLN
INTRAMUSCULAR | Status: AC
  Filled 2019-01-30: qty 2

## 2019-01-30 MED ORDER — DEXAMETHASONE SODIUM PHOSPHATE 10 MG/ML IJ SOLN
INTRAMUSCULAR | Status: AC
  Filled 2019-01-30: qty 1

## 2019-01-30 MED ORDER — LACTATED RINGERS IV SOLN
INTRAVENOUS | Status: DC
  Administered 2019-01-30: 10:00:00 via INTRAVENOUS

## 2019-01-30 MED ORDER — LIDOCAINE HCL (CARDIAC) PF 100 MG/5ML IV SOSY
PREFILLED_SYRINGE | INTRAVENOUS | Status: DC | PRN
  Administered 2019-01-30: 60 mg via INTRAVENOUS

## 2019-01-30 SURGICAL SUPPLY — 18 items
BAG URO CATCHER STRL LF (MISCELLANEOUS) ×3 IMPLANT
BASKET ZERO TIP NITINOL 2.4FR (BASKET) IMPLANT
BSKT STON RTRVL ZERO TP 2.4FR (BASKET)
CATH INTERMIT  6FR 70CM (CATHETERS) ×3 IMPLANT
CLOTH BEACON ORANGE TIMEOUT ST (SAFETY) ×3 IMPLANT
COVER WAND RF STERILE (DRAPES) IMPLANT
FIBER LASER TRAC TIP (UROLOGICAL SUPPLIES) ×3 IMPLANT
GLOVE BIOGEL M STRL SZ7.5 (GLOVE) ×3 IMPLANT
GOWN STRL REUS W/TWL LRG LVL3 (GOWN DISPOSABLE) ×3 IMPLANT
GOWN STRL REUS W/TWL XL LVL3 (GOWN DISPOSABLE) ×3 IMPLANT
GUIDEWIRE ANG ZIPWIRE 038X150 (WIRE) ×3 IMPLANT
GUIDEWIRE STR DUAL SENSOR (WIRE) ×3 IMPLANT
MANIFOLD NEPTUNE II (INSTRUMENTS) ×3 IMPLANT
PACK CYSTO (CUSTOM PROCEDURE TRAY) ×3 IMPLANT
SHEATH ACCESS URETERAL 24CM (SHEATH) ×3 IMPLANT
STENT CONTOUR 6FRX26X.038 (STENTS) ×3 IMPLANT
TUBING CONNECTING 10 (TUBING) ×2 IMPLANT
TUBING CONNECTING 10' (TUBING) ×1

## 2019-01-30 NOTE — Anesthesia Preprocedure Evaluation (Addendum)
Anesthesia Evaluation  Patient identified by MRN, date of birth, ID band Patient awake    Reviewed: Allergy & Precautions, NPO status , Patient's Chart, lab work & pertinent test results  Airway Mallampati: II  TM Distance: >3 FB     Dental   Pulmonary shortness of breath, former smoker,    breath sounds clear to auscultation       Cardiovascular negative cardio ROS   Rhythm:Regular Rate:Normal     Neuro/Psych    GI/Hepatic Neg liver ROS, GERD  ,  Endo/Other  negative endocrine ROS  Renal/GU negative Renal ROS     Musculoskeletal   Abdominal   Peds  Hematology   Anesthesia Other Findings   Reproductive/Obstetrics                             Anesthesia Physical Anesthesia Plan  ASA: III  Anesthesia Plan: General   Post-op Pain Management:    Induction: Intravenous  PONV Risk Score and Plan: 3 and Ondansetron, Dexamethasone and Midazolam  Airway Management Planned: LMA  Additional Equipment:   Intra-op Plan:   Post-operative Plan: Extubation in OR  Informed Consent: I have reviewed the patients History and Physical, chart, labs and discussed the procedure including the risks, benefits and alternatives for the proposed anesthesia with the patient or authorized representative who has indicated his/her understanding and acceptance.     Dental advisory given  Plan Discussed with: Anesthesiologist and CRNA  Anesthesia Plan Comments:         Anesthesia Quick Evaluation

## 2019-01-30 NOTE — Anesthesia Procedure Notes (Signed)
Procedure Name: LMA Insertion Date/Time: 01/30/2019 12:51 PM Performed by: Glory Buff, CRNA Pre-anesthesia Checklist: Patient identified, Emergency Drugs available, Suction available and Patient being monitored Patient Re-evaluated:Patient Re-evaluated prior to induction Oxygen Delivery Method: Circle system utilized Preoxygenation: Pre-oxygenation with 100% oxygen Induction Type: IV induction LMA: LMA inserted LMA Size: 4.0 Number of attempts: 1 Placement Confirmation: positive ETCO2 Tube secured with: Tape Dental Injury: Teeth and Oropharynx as per pre-operative assessment

## 2019-01-30 NOTE — Op Note (Signed)
Preoperative diagnosis: Right renal stone Postoperative diagnosis: Right renal stone  Procedure: Cystoscopy, right retrograde pyelogram, right holmium laser lithotripsy, right ureteral stent placement  Surgeon: Junious Silk  Anesthesia: General  Indication for procedure: 43 year old female who underwent right shockwave lithotripsy for about a 10 mm right renal pelvic stone.  The stone did not appear to fragment at all and remained persistent in the renal pelvis.  She was brought today for ureteroscopy.  Findings: The urethra and bladder are unremarkable.  Right retrograde pyelogram-this outlined a single ureter single collecting system unit without stricture, filling defect or dilation.  There was a filling defect in the right renal pelvis consistent with the stone.  And the stone was visible faintly on the fluoroscopic scout image.  On ureteroscopy the stone was fragile and basically shattered like safety glass when the laser hit it.  Description of procedure: After consent was obtained patient brought to the operating room.  After adequate anesthesia she is placed in lithotomy position and prepped and draped in usual sterile fashion.  A timeout was performed to confirm the patient and procedure.  The cystoscope was passed per urethra and the bladder inspected.  The right ureteral orifice was cannulated with a 6 [place] open-ended catheter.  Retrograde injection of contrast was performed.  A sensor wire was advanced and coiled into the collecting system.  I passed the inner cannula of the access sheath and this went without too much difficulty but the distal ureter was a bit tight.  I then tried to pass the access sheath with the cannula as a complete unit and there was quite a bit of resistance.  Therefore I thought it best to just make sure the ureter was clear so I passed a semirigid ureteroscope along the wire up into the proximal ureter and there was no ureteral injury nor stone fragment.  I then  passed the access sheath and this time was able to advance it into the proximal ureter.  I removed the inner cannula and used the access sheath to place 2 wires.  I then backed out the access sheath and went over the sensor wire leaving the Glidewire as the safety.  I then advanced the dual channel digital flexible ureteroscope into the kidney and the stone was visualized and appeared to be triangular and about a centimeter.  I was able to flip it to the upper pole calyx and then used the scope to push it into the upper pole calyx.  And I deployed a 200 m laser fiber and at 0.3 and 50 began to laser the edge.  The stone quickly jumped and broke apart and therefore I backed the settings down to 0.8 and 8 and the stone dusted quickly.  There were no significant fragments.  I inspected the remainder the collecting system and noted no other fragments as well as the renal pelvis and proximal ureter normal.  I backed out the ureteroscope and access sheath together inspecting the ureter and there was some mild mucosal splitting in the distal ureter with some erythema and mucosal oozing and so I thought it best to leave a stent for a few days.  The wire was backloaded on the cystoscope and a 6 x 26 cm stent advanced.  The wire was removed with a good coil seen in the kidney and a good coil in the bladder.  The bladder was drained and the scope removed.  We had tied a new knot in the string and trimmed it and the  tech tucked the string into the vagina.  She was awakened and taken to the recovery room in stable condition.  Complications: None  Blood loss: Minimal  Specimens: None  Drains: 6 x 26 cm right ureteral stent with string-I will have her remove the stent Monday morning February 05, 2019  Disposition: Patient stable to PACU

## 2019-01-30 NOTE — Transfer of Care (Signed)
Immediate Anesthesia Transfer of Care Note  Patient: Rhonda Hester  Procedure(s) Performed: CYSTOSCOPY/RETROGRADE/URETEROSCOPY/HOLMIUM LASER/STENT PLACEMENT (Right )  Patient Location: PACU  Anesthesia Type:General  Level of Consciousness: drowsy, patient cooperative and responds to stimulation  Airway & Oxygen Therapy: Patient Spontanous Breathing and Patient connected to face mask oxygen  Post-op Assessment: Report given to RN and Post -op Vital signs reviewed and stable  Post vital signs: Reviewed and stable  Last Vitals:  Vitals Value Taken Time  BP 157/93 01/30/2019  1:47 PM  Temp 37.1 C 01/30/2019  1:47 PM  Pulse 64 01/30/2019  1:48 PM  Resp 13 01/30/2019  1:48 PM  SpO2 100 % 01/30/2019  1:48 PM  Vitals shown include unvalidated device data.  Last Pain:  Vitals:   01/30/19 0929  TempSrc:   PainSc: 0-No pain         Complications: No apparent anesthesia complications

## 2019-01-30 NOTE — H&P (Signed)
H&P  Chief Complaint: Right UPJ stone  History of Present Illness: Rhonda Hester is a 43 year old female who developed a symptomatic right UPJ stone.  She underwent right extracorporeal shockwave lithotripsy November 30, 2018.  The stone did not appear to change significantly.  In fact on a follow-up CT scan the stone appeared quite stable at the right UPJ.  Her urine culture was negative. She has not seen the stone pass nor had flank pain.   Otherwise, she has been well.  No fevers, coughs, colds or congestion, no dysuria or gross hematuria.  Past Medical History:  Diagnosis Date  . Allergic asthma   . Allergy    SEASONAL  . Anxiety   . Breast mass 05/2017   lt breast lump  . Eczema   . GERD (gastroesophageal reflux disease)   . History of anal fissures   . History of Bell's palsy    2006  RIGHT SIDE--  RESOLVED  . History of gastroesophageal reflux (GERD)   . History of kidney stones   . Hyperlipidemia   . Interstitial cystitis   . Sarcoidosis of lung ([place])    DX 2006--  PULMOLOGIST--  DR VEHM- on 2 liters of oxygen, goes to pain clinic  . Seasonal allergic rhinitis   . Shortness of breath    Past Surgical History:  Procedure Laterality Date  . CYSTO WITH HYDRODISTENSION N/A 08/24/2013   Procedure: CYSTOSCOPY/HYDRODISTENSION with instillation of marcaine and pyridium;  Surgeon: Fredricka Bonine, MD;  Location: [facility];  Service: Urology;  Laterality: N/A;  . CYSTOSCOPY/ HYDRODISTENTION/ BLADDER BX  06-09-2010  . DILATION AND CURETTAGE OF UTERUS  1997  . EXTRACORPOREAL SHOCK WAVE LITHOTRIPSY Right 11/30/2018   Procedure: EXTRACORPOREAL SHOCK WAVE LITHOTRIPSY (ESWL);  Surgeon: Bjorn Loser, MD;  Location: [facility];  Service: Urology;  Laterality: Right;  . IR TRANSCATHETER BX  06/14/2018  . IR US GUIDE VASC ACCESS RIGHT  06/14/2018  . IR VENOGRAM HEPATIC W HEMODYNAMIC EVALUATION  06/14/2018    Home Medications:  Facility-Administered Medications  Prior to Admission  Medication Dose Route Frequency Provider Last Rate Last Dose  . leuprolide ([name]) injection 11.25 mg  11.25 mg Intramuscular Q90 days Dove, Myra C, MD   11.25 mg at 03/01/18 0945  . leuprolide ([name]) injection 3.75 mg  3.75 mg Intramuscular Once Denney, Rachelle A, CNM       Medications Prior to Admission  Medication Sig Dispense Refill Last Dose  . methotrexate (RHEUMATREX) 2.5 MG tablet Take 2.5 mg by mouth every Wednesday. Caution: Chemotherapy. Protect from light.   11/28/2018  . Oxycodone HCl 10 MG TABS Take 10 mg by mouth every 4 (four) hours as needed (pain).   01/29/2019 at Unknown time  . albuterol (PROAIR HFA) 108 (90 Base) MCG/ACT inhaler Inhale 2 puffs into the lungs every 6 (six) hours as needed for shortness of breath. 1 Inhaler 5 More than a month at Unknown time  . ANALPRAM HC 2.5-1 % rectal cream Place 1 application rectally 2 (two) times daily. Apply externally to hemorrhoids twice a day for 7-10 days. (Patient not taking: Reported on 01/24/2019) 30 g 0 Not Taking at Unknown time  . ciprofloxacin (CIPRO) 500 MG tablet Take 1 tablet (500 mg total) by mouth every 12 (twelve) hours. (Patient not taking: Reported on 01/24/2019) 10 tablet 0 Not Taking at Unknown time  . folic acid (FOLVITE) 1 MG tablet Take 1 mg by mouth every Wednesday.    More than a month  at Unknown time  . hydrocortisone (ANUSOL-HC) 2.5 % rectal cream Place 1 application rectally 2 (two) times daily. (Patient not taking: Reported on 01/24/2019) 30 g 1 Not Taking at Unknown time  . hydrocortisone (ANUSOL-HC) 25 MG suppository Place 1 suppository (25 mg total) rectally at bedtime. (Patient not taking: Reported on 09/21/2018) 30 suppository 0 Not Taking at Unknown time  . meloxicam (MOBIC) 15 MG tablet Take 15 mg by mouth daily as needed for pain.   0 More than a month at Unknown time  . metoCLOPramide (REGLAN) 10 MG tablet Take 1 tablet (10 mg total) by mouth every 6 (six) hours. (Patient not taking:  Reported on 09/21/2018) 30 tablet 0 Not Taking at Unknown time   Allergies:  Allergies  Allergen Reactions  . Acyclovir And Related Other (See Comments)    Patient states it "made me feel like I can't breath"   . Citric Acid Other (See Comments)    AVOIDS DUE TO INTERSTITIAL CYSTITIS  . Dust Mite Extract Itching and Other (See Comments)     coughing  . Grapeseed Extract [Nutritional Supplements] Other (See Comments)    Throat itching and burning  . Pineapple Itching    Burning in mouth and tingling lips  . Pollen Extract Itching and Other (See Comments)     coughing  . Nickel Rash    Family History  Problem Relation Age of Onset  . Brain cancer Maternal Grandmother   . Hyperlipidemia Other   . Sleep apnea Other   . Depression Maternal Aunt   . Seizures Maternal Uncle   . Colon cancer Neg Hx   . Stomach cancer Neg Hx    Social History:  reports that she quit smoking about 2 years ago. Her smoking use included cigarettes and cigars. She has a 1.80 pack-year smoking history. She has never used smokeless tobacco. She reports that she does not drink alcohol or use drugs.  ROS: A complete review of systems was performed.  All systems are negative except for pertinent findings as noted. Review of Systems  All other systems reviewed and are negative.    Physical Exam:  Vital signs in last 24 hours: Temp:  [98.2 F (36.8 C)] 98.2 F (36.8 C) (02/04 0858) Pulse Rate:  [68] 68 (02/04 0858) Resp:  [16] 16 (02/04 0858) BP: (129)/(93) 129/93 (02/04 0858) SpO2:  [97 %] 97 % (02/04 0858) Weight:  [92.9 kg] 92.9 kg (02/04 0929) General:  Alert and oriented, No acute distress HEENT: Normocephalic, atraumatic Cardiovascular: Regular rate and rhythm Lungs: Regular rate and effort Abdomen: Soft, nontender, nondistended, no abdominal masses Back: No CVA tenderness Extremities: No edema Neurologic: Grossly intact  Laboratory Data:  Results for orders placed or performed during the  hospital encounter of 01/30/19 (from the past 24 hour(s))  hCG, serum, qualitative     Status: None   Collection Time: 01/30/19  9:26 AM  Result Value Ref Range   Preg, Serum NEGATIVE NEGATIVE   No results found for this or any previous visit (from the past 240 hour(s)). Creatinine: No results for input(s): CREATININE in the last 168 hours.  Impression/Assessment:  Right UPJ stone  Plan:  I discussed with the patient the nature, potential benefits, risks and alternatives to cystoscopy, right retrograde pyelogram, right ureteroscopy with holmium laser lithotripsy and right ureteral stent placement, including side effects of the proposed treatment, the likelihood of the patient achieving the goals of the procedure, and any potential problems that might occur during the  procedure or recuperation.  We discussed she may need a staged procedure with a pre-stent.  All questions answered. Patient elects to proceed.    Festus Aloe 01/30/2019, 10:34 AM

## 2019-01-30 NOTE — Anesthesia Postprocedure Evaluation (Signed)
Anesthesia Post Note  Patient: Rhonda Hester  Procedure(s) Performed: CYSTOSCOPY/RETROGRADE/URETEROSCOPY/HOLMIUM LASER/STENT PLACEMENT (Right )     Patient location during evaluation: PACU Anesthesia Type: General Level of consciousness: awake Pain management: pain level controlled Vital Signs Assessment: post-procedure vital signs reviewed and stable Respiratory status: spontaneous breathing Cardiovascular status: stable Postop Assessment: no apparent nausea or vomiting    Last Vitals:  Vitals:   01/30/19 1415 01/30/19 1430  BP: 137/89 122/83  Pulse: 72 69  Resp: 13 (!) 24  Temp:  37 C  SpO2: 98% 94%    Last Pain:  Vitals:   01/30/19 1415  TempSrc:   PainSc: 7                  Ellsie Violette

## 2019-01-30 NOTE — Discharge Instructions (Signed)
Ureteral Stent Implantation, Care After Refer to this sheet in the next few weeks. These instructions provide you with information about caring for yourself after your procedure. Your health care provider may also give you more specific instructions. Your treatment has been planned according to current medical practices, but problems sometimes occur. Call your health care provider if you have any problems or questions after your procedure.  Removal of the stent: Remove the stent by pulling the string with slow steady pressure on Monday morning, February 05, 2019  What can I expect after the procedure? After the procedure, it is common to have:  Nausea.  Mild pain when you urinate. You may feel this pain in your lower back or lower abdomen. Pain should stop within a few minutes after you urinate. This may last for up to 1 week.  A small amount of blood in your urine for several days. Follow these instructions at home:  Medicines  Take over-the-counter and prescription medicines only as told by your health care provider.  If you were prescribed an antibiotic medicine, take it as told by your health care provider. Do not stop taking the antibiotic even if you start to feel better.  Do not drive for 24 hours if you received a sedative.  Do not drive or operate heavy machinery while taking prescription pain medicines. Activity  Return to your normal activities as told by your health care provider. Ask your health care provider what activities are safe for you.  Do not lift anything that is heavier than 10 lb (4.5 kg). Follow this limit for 1 week after your procedure, or for as long as told by your health care provider. General instructions  Watch for any blood in your urine. Call your health care provider if the amount of blood in your urine increases.  If you have a catheter: ? Follow instructions from your health care provider about taking care of your catheter and collection  bag. ? Do not take baths, swim, or use a hot tub until your health care provider approves.  Drink enough fluid to keep your urine clear or pale yellow.  Keep all follow-up visits as told by your health care provider. This is important. Contact a health care provider if:  You have pain that gets worse or does not get better with medicine, especially pain when you urinate.  You have difficulty urinating.  You feel nauseous or you vomit repeatedly during a period of more than 2 days after the procedure. Get help right away if:  Your urine is dark red or has blood clots in it.  You are leaking urine (have incontinence).  The end of the stent comes out of your urethra.  You cannot urinate.  You have sudden, sharp, or severe pain in your abdomen or lower back.  You have a fever. This information is not intended to replace advice given to you by your health care provider. Make sure you discuss any questions you have with your health care provider. Document Released: 08/15/2013 Document Revised: 05/20/2016 Document Reviewed: 06/27/2015 Elsevier Interactive Patient Education  2019 [employer].

## 2019-01-31 ENCOUNTER — Encounter (HOSPITAL_COMMUNITY): Payer: Self-pay | Admitting: Urology

## 2019-02-26 ENCOUNTER — Telehealth (HOSPITAL_COMMUNITY): Payer: Self-pay

## 2019-02-26 NOTE — Telephone Encounter (Signed)
Pt called for follow up for referral to Pulmonary rehab that was received on 11/8. With previous call, pt stated that she wanted to wait until after her surgery that was to take place on 01/30/19. Pt successfully completed surgery, but states she would like to wait to start pulmonary rehab until after she finished the shots that she is receiving for Sarcoidosis. Pt states her last shot is at the end of March. Pt educated that referral is still active and to give [place] a call when she would like to be scheduled and that we would follow up at the beginning of April if we have no heard from her.Pt verbalized understanding.   Joycelyn Man RN, BSN Cardiac and Pulmonary Rehab RN

## 2019-03-23 ENCOUNTER — Telehealth (HOSPITAL_COMMUNITY): Payer: Self-pay

## 2019-03-23 NOTE — Telephone Encounter (Signed)
Called and spoke with pt in regards to PR, adv we have recv'd the pt referral. And at this time we are not scheduling due to the COVID-19. Once we have resume scheduling we will contact the pt. She/He verbalized understanding. Tedra [place]. Support Rep II

## 2019-04-05 ENCOUNTER — Other Ambulatory Visit: Payer: Self-pay | Admitting: Family Medicine

## 2019-04-05 DIAGNOSIS — J3089 Other allergic rhinitis: Secondary | ICD-10-CM

## 2019-04-19 ENCOUNTER — Telehealth (HOSPITAL_COMMUNITY): Payer: Self-pay | Admitting: *Deleted

## 2019-04-19 NOTE — Telephone Encounter (Signed)
Unable to leave message on phone - voicemail box is full.  Will send please contact form.  Pt referral dates back to November 2019.  If no response will close this referral. Maurice Small RN, BSN Cardiac and Pulmonary Rehab Nurse Navigator

## 2019-04-20 ENCOUNTER — Telehealth (HOSPITAL_COMMUNITY): Payer: Self-pay | Admitting: *Deleted

## 2019-04-20 NOTE — Telephone Encounter (Signed)
Pt returned call.  Pt remains interested in participating in CR.  Pt has not developed any exercise routine due to covid-19 and lack motivation to exercise by herself within the home. Pt would like information on exercise and nutrition.  Will send pt exercise handouts on warm up/coold down and band/chair exercises. Will provide bands she can use. Will follow up closely with pt for encouragement and support.  Pt interested in receiving educational videos.  Verified her email address. Cherre Huger, BSN Cardiac and Training and development officer ;

## 2019-04-25 ENCOUNTER — Telehealth: Payer: Self-pay

## 2019-04-25 NOTE — Telephone Encounter (Signed)
Patient called and request that new referral for different pain management be sent. She has been seen at [facility] but has now requested to be sent to [facility] in [place]. I have faxed this to their office today 04/25/2019@11 :56am. Their fax number is [phone]

## 2019-05-11 ENCOUNTER — Other Ambulatory Visit: Payer: Self-pay

## 2019-05-11 ENCOUNTER — Encounter (HOSPITAL_COMMUNITY): Payer: Self-pay

## 2019-05-11 ENCOUNTER — Ambulatory Visit (HOSPITAL_COMMUNITY)
Admission: EM | Admit: 2019-05-11 | Discharge: 2019-05-11 | Disposition: A | Discharge status: Home / Self Care | Payer: Medicare Other | Attending: Family Medicine | Admitting: Family Medicine

## 2019-05-11 DIAGNOSIS — J029 Acute pharyngitis, unspecified: Secondary | ICD-10-CM

## 2019-05-11 MED ORDER — LIDOCAINE VISCOUS HCL 2 % MT SOLN
15.0000 mL | Freq: Once | OROMUCOSAL | Status: AC
  Administered 2019-05-11: 14:00:00 15 mL via ORAL

## 2019-05-11 MED ORDER — ALUM & MAG HYDROXIDE-SIMETH 200-200-20 MG/5ML PO SUSP
ORAL | Status: AC
  Filled 2019-05-11: qty 30

## 2019-05-11 MED ORDER — CETIRIZINE HCL 10 MG PO CAPS: 10.0000 mg | ORAL_CAPSULE | Freq: Every day | ORAL | 0 refills | Status: DC

## 2019-05-11 MED ORDER — LIDOCAINE VISCOUS HCL 2 % MT SOLN
OROMUCOSAL | Status: AC
  Filled 2019-05-11: qty 15

## 2019-05-11 MED ORDER — ALUM & MAG HYDROXIDE-SIMETH 200-200-20 MG/5ML PO SUSP
30.0000 mL | Freq: Once | ORAL | Status: AC
  Administered 2019-05-11: 30 mL via ORAL

## 2019-05-11 NOTE — Discharge Instructions (Signed)
Follow up with primary doctor/ ENT for further evaluation May try daily cetirizine to help with any drainage irritating throat  Follow up with ENT or your pulmonologist

## 2019-05-11 NOTE — ED Provider Notes (Signed)
[place]    CSN: [id number] Arrival date & time: 05/11/19  1232     History   Chief Complaint Chief Complaint  Patient presents with  . Sore Throat    HPI Rhonda Hester is a 43 y.o. female history of sarcoidosis, GERD, hyperlipidemia, presenting today for evaluation of throat discomfort.  Patient states that over the past month she has had discomfort in her lower throat.  She notes that she has a wheezing sensation at nighttime.  Also has a burning sensation throughout the day.  Denies pain near her tonsils.  Denies difficulty swallowing or pain with swallowing.  Denies sensation of food or liquids getting stuck in throat.  Has had an occasional productive cough.  Denies fevers.  Denies rhinorrhea.  Feels symptoms may be related to her sarcoidosis.  She has not tried anything for her symptoms.  Denies worsening with eating or position.  HPI  Past Medical History:  Diagnosis Date  . Allergic asthma   . Allergy    SEASONAL  . Anxiety   . Breast mass 05/2017   lt breast lump  . Eczema   . GERD (gastroesophageal reflux disease)   . History of anal fissures   . History of Bell's palsy    2006  RIGHT SIDE--  RESOLVED  . History of gastroesophageal reflux (GERD)   . History of kidney stones   . Hyperlipidemia   . Interstitial cystitis   . Sarcoidosis of lung ([place])    DX 2006--  PULMOLOGIST--  DR [id number]- on 2 liters of oxygen, goes to pain clinic  . Seasonal allergic rhinitis   . Shortness of breath     Patient Active Problem List   Diagnosis Date Noted  . Enlarged submental lymph node 03/30/2018  . Nodule of soft tissue 03/22/2018  . Hemorrhoids 02/13/2018  . TMJ (temporomandibular joint disorder) 12/23/2017  . Acute pain of right shoulder 11/26/2017  . Chronic bilateral low back pain without sciatica 08/01/2017  . Chronic pain of right knee 08/01/2017  . Depression due to physical illness 04/02/2017  . Pelvic pain 03/28/2017  . Allergic reaction to  food 03/19/2017  . Obesity (BMI 30.0-34.9) 03/19/2017  . Generalized pain 03/19/2017  . Metabolic syndrome 73/41/9379  . Medication management 03/19/2017  . Tobacco abuse 01/24/2017  . Uterine fibroid 01/17/2017  . Interstitial cystitis 01/17/2017  . Menorrhagia 12/06/2016  . Acne vulgaris 05/10/2016  . BMI 31.0-31.9,adult 05/10/2016  . Weight gain 05/10/2016  . Right hip pain 10/27/2014  . Sarcoidosis of lung ([place])   . Cervical adenopathy 03/12/2014  . History of smoking 08/07/2013  . Allergic asthma 07/22/2011    Past Surgical History:  Procedure Laterality Date  . CYSTO WITH HYDRODISTENSION N/A 08/24/2013   Procedure: CYSTOSCOPY/HYDRODISTENSION with instillation of marcaine and pyridium;  Surgeon: Fredricka Bonine, MD;  Location: [facility];  Service: Urology;  Laterality: N/A;  . CYSTOSCOPY/ HYDRODISTENTION/ BLADDER BX  06-09-2010  . CYSTOSCOPY/URETEROSCOPY/HOLMIUM LASER/STENT PLACEMENT Right 01/30/2019   Procedure: CYSTOSCOPY/RETROGRADE/URETEROSCOPY/HOLMIUM LASER/STENT PLACEMENT;  Surgeon: Festus Aloe, MD;  Location: [facility];  Service: Urology;  Laterality: Right;  . DILATION AND CURETTAGE OF UTERUS  1997  . EXTRACORPOREAL SHOCK WAVE LITHOTRIPSY Right 11/30/2018   Procedure: EXTRACORPOREAL SHOCK WAVE LITHOTRIPSY (ESWL);  Surgeon: Bjorn Loser, MD;  Location: [facility];  Service: Urology;  Laterality: Right;  . IR TRANSCATHETER BX  06/14/2018  . IR US GUIDE VASC ACCESS RIGHT  06/14/2018  . IR VENOGRAM HEPATIC W HEMODYNAMIC EVALUATION  06/14/2018    OB History    Gravida  1   Para  0   Term  0   Preterm  0   AB  1   Living  0     SAB  1   TAB  0   Ectopic  0   Multiple  0   Live Births               Home Medications    Prior to Admission medications   Medication Sig Start Date End Date Taking? Authorizing Provider  albuterol (PROAIR HFA) 108 (90 Base) MCG/ACT inhaler Inhale 2 puffs into the lungs every 6 (six) hours as  needed for shortness of breath. 05/10/18   Dorena Dew, FNP  Cetirizine HCl 10 MG CAPS Take 1 capsule (10 mg total) by mouth daily. 05/11/19   Danial Hlavac C, PA-C  folic acid (FOLVITE) 1 MG tablet Take 1 mg by mouth every Wednesday.     [provider]  meloxicam (MOBIC) 15 MG tablet Take 15 mg by mouth daily as needed for pain.  09/14/18   [provider]  methotrexate (RHEUMATREX) 2.5 MG tablet Take 2.5 mg by mouth every Wednesday. Caution: Chemotherapy. Protect from light.    [provider]  Oxycodone HCl 10 MG TABS Take 10 mg by mouth every 4 (four) hours as needed (pain).    [provider]    Family History Family History  Problem Relation Age of Onset  . Brain cancer Maternal Grandmother   . Hyperlipidemia Other   . Sleep apnea Other   . Depression Maternal Aunt   . Seizures Maternal Uncle   . Colon cancer Neg Hx   . Stomach cancer Neg Hx     Social History Social History   Tobacco Use  . Smoking status: Former Smoker    Packs/day: 0.30    Years: 6.00    Pack years: 1.80    Types: Cigarettes, Cigars    Last attempt to quit: 11/2016    Years since quitting: 2.4  . Smokeless tobacco: Never Used  . Tobacco comment: ONE CIG. DAILY /  1 PPMONTH  Substance Use Topics  . Alcohol use: No    Alcohol/week: 0.0 standard drinks  . Drug use: No    Comment: HX MARIJUANA USE     Allergies   Acyclovir and related; Citric acid; Dust mite extract; Grapeseed extract [nutritional supplements]; Pineapple; Pollen extract; and Nickel   Review of Systems Review of Systems  Constitutional: Negative for activity change, appetite change, chills, fatigue and fever.  HENT: Positive for sore throat. Negative for congestion, ear pain, rhinorrhea, sinus pressure and trouble swallowing.   Eyes: Negative for discharge and redness.  Respiratory: Negative for cough, chest tightness and shortness of breath.   Cardiovascular: Negative for chest pain.   Gastrointestinal: Negative for abdominal pain, diarrhea, nausea and vomiting.  Musculoskeletal: Negative for myalgias.  Skin: Negative for rash.  Neurological: Negative for dizziness, light-headedness and headaches.     Physical Exam Triage Vital Signs ED Triage Vitals  Enc Vitals Group     BP 05/11/19 1326 131/88     Pulse Rate 05/11/19 1326 71     Resp 05/11/19 1326 18     Temp 05/11/19 1326 98.2 F (36.8 C)     Temp Source 05/11/19 1326 Oral     SpO2 05/11/19 1326 99 %     Weight --      Height --  Head Circumference --      Peak Flow --      Pain Score 05/11/19 1323 3     Pain Loc --      Pain Edu? --      Excl. in [place]? --    No data found.  Updated Vital Signs BP 131/88 (BP Location: Left Arm)   Pulse 71   Temp 98.2 F (36.8 C) (Oral)   Resp 18   SpO2 99%   Visual Acuity Right Eye Distance:   Left Eye Distance:   Bilateral Distance:    Right Eye Near:   Left Eye Near:    Bilateral Near:     Physical Exam Vitals signs and nursing note reviewed.  Constitutional:      General: She is not in acute distress.    Appearance: She is well-developed.  HENT:     Head: Normocephalic and atraumatic.     Mouth/Throat:     Comments: Oral mucosa pink and moist, no tonsillar enlargement or exudate. Posterior pharynx patent and nonerythematous, no uvula deviation or swelling. Normal phonation.  Eyes:     Conjunctiva/sclera: Conjunctivae normal.  Neck:     Musculoskeletal: Neck supple.     Comments: No bruit or stridor auscultated Cardiovascular:     Rate and Rhythm: Normal rate and regular rhythm.     Heart sounds: No murmur.  Pulmonary:     Effort: Pulmonary effort is normal. No respiratory distress.     Breath sounds: Normal breath sounds.     Comments: Breathing comfortably at rest, CTABL, no wheezing, rales or other adventitious sounds auscultated Abdominal:     Palpations: Abdomen is soft.     Tenderness: There is no abdominal tenderness.  Skin:     General: Skin is warm and dry.  Neurological:     Mental Status: She is alert.      UC Treatments / Results  Labs (all labs ordered are listed, but only abnormal results are displayed) Labs Reviewed - No data to display  EKG None  Radiology No results found.  Procedures Procedures (including critical care time)  Medications Ordered in UC Medications  alum & mag hydroxide-simeth (MAALOX/MYLANTA) 200-200-20 MG/5ML suspension 30 mL (30 mLs Oral Given 05/11/19 1414)    And  lidocaine (XYLOCAINE) 2 % viscous mouth solution 15 mL (15 mLs Oral Given 05/11/19 1414)    Initial Impression / Assessment and Plan / UC Course  I have reviewed the triage vital signs and the nursing notes.  Pertinent labs & imaging results that were available during my care of the patient were reviewed by me and considered in my medical decision making (see chart for details).     GI cocktail provided without relief of burning sensation.  Based off patient's description seems more sensation within trachea.  No thyroid abnormality palpated.  Recommended decreasing smoking use.  May try daily cetirizine for the next 1 to 2 weeks.  Help with any drainage contributing to symptoms.  Advised to follow-up with ENT/pulmonology if symptoms persisting for further evaluation.Discussed strict return precautions. Patient verbalized understanding and is agreeable with plan.  Final Clinical Impressions(s) / UC Diagnoses   Final diagnoses:  Sore throat     Discharge Instructions     Follow up with primary doctor/ ENT for further evaluation May try daily cetirizine to help with any drainage irritating throat  Follow up with ENT or your pulmonologist    ED Prescriptions    Medication Sig Dispense Auth.  Provider   Cetirizine HCl 10 MG CAPS Take 1 capsule (10 mg total) by mouth daily. 20 capsule Earle Burson C, PA-C     Controlled Substance Prescriptions [place] Controlled Substance Registry consulted? Not  Applicable   Janith Lima, [place] 05/11/19 1457

## 2019-05-11 NOTE — ED Triage Notes (Signed)
Patient presents to Urgent Care with complaints of sore throat since a month ago. Patient reports no cough, fever, or SOB. Pt states she has hx of sarcoidosis and thinks it may be related to that.

## 2019-05-23 ENCOUNTER — Telehealth: Payer: Self-pay

## 2019-05-23 NOTE — Telephone Encounter (Signed)
Called to do COVID -19 Screening. Patient preferred to have visit via telephone. Thanks!

## 2019-05-24 ENCOUNTER — Encounter: Payer: Self-pay | Admitting: Family Medicine

## 2019-05-24 ENCOUNTER — Other Ambulatory Visit: Payer: Self-pay

## 2019-05-24 ENCOUNTER — Ambulatory Visit (INDEPENDENT_AMBULATORY_CARE_PROVIDER_SITE_OTHER): Payer: Medicare Other | Admitting: Family Medicine

## 2019-05-24 DIAGNOSIS — M545 Low back pain: Secondary | ICD-10-CM

## 2019-05-24 DIAGNOSIS — G8929 Other chronic pain: Secondary | ICD-10-CM | POA: Diagnosis not present

## 2019-05-24 DIAGNOSIS — D860 Sarcoidosis of lung: Secondary | ICD-10-CM

## 2019-05-24 MED ORDER — FAMOTIDINE 20 MG PO TABS: 20.0000 mg | ORAL_TABLET | Freq: Two times a day (BID) | ORAL | 0 refills | Status: DC

## 2019-05-24 NOTE — Progress Notes (Signed)
  Patient [facility]  Virtual Visit via Telephone Note  I connected with Rhonda Hester on 05/24/19 at  8:20 AM EDT by telephone and verified that I am speaking with the correct person using two identifiers.   I discussed the limitations, risks, security and privacy concerns of performing an evaluation and management service by telephone and the availability of in person appointments. I also discussed with the patient that there may be a patient responsible charge related to this service. The patient expressed understanding and agreed to proceed.   History of Present Illness: Rhonda Hester  has a past medical history of Allergic asthma, Allergy, Anxiety, Breast mass (05/2017), Eczema, GERD (gastroesophageal reflux disease), History of anal fissures, History of Bell's palsy, History of gastroesophageal reflux (GERD), History of kidney stones, Hyperlipidemia, Interstitial cystitis, Sarcoidosis of lung ([place]), Seasonal allergic rhinitis, and Shortness of breath. Patient states that she would like to have a referral to another pain management clinic. She reports that she is currently at [facility] pain clinic. She called on April 25, 2019 for a referral. The notes state that the referral was faxed to the clinic of her choice, [facility]. Patient reports that she has not received any information from them.  She reports compliance with medications and denies side effects at the present time. She reports having an increased amount of mucus in her throat that is causing her to wake from sleep. She is followed by pulmonology for sarcoidosis.    Observations/Objective: Patient with regular voice tone, rate and rhythm. Speaking calmly and is in no apparent distress.    Assessment and Plan:  1. Sarcoidosis of lung [facility]) Patient is being followed by a specialist for this condition. Patient advised to continue with follow up appointments. PCP will  continue to monitor progress.  Will add famotidine to see if this provides relief of increased mucus.    2. Chronic bilateral low back pain without sciatica Patient to call pain management clinic to determine if they have received her referral.    Follow Up Instructions:  We discussed hand washing, using hand sanitizer when soap and water are not available, only going out when absolutely necessary, and social distancing. Explained to patient that she is immunocompromised and will need to take precautions during this time.   I discussed the assessment and treatment plan with the patient. The patient was provided an opportunity to ask questions and all were answered. The patient agreed with the plan and demonstrated an understanding of the instructions.   The patient was advised to call back or seek an in-person evaluation if the symptoms worsen or if the condition fails to improve as anticipated.  I provided 15 minutes of non-face-to-face time during this encounter.  Ms. Andr L. Nathaneil Canary, FNP-BC Patient [facility] [address] [place], [place] [address] [phone]

## 2019-06-21 ENCOUNTER — Ambulatory Visit: Payer: Self-pay | Admitting: Family Medicine

## 2019-06-22 ENCOUNTER — Telehealth: Payer: Self-pay

## 2019-06-22 NOTE — Telephone Encounter (Signed)
Called and spoke with patient for COVID 19 Screening. Patient had no risk factors and is cleared to come into office for appointment. Thanks! 

## 2019-06-25 ENCOUNTER — Encounter: Payer: Self-pay | Admitting: Family Medicine

## 2019-06-25 ENCOUNTER — Ambulatory Visit (INDEPENDENT_AMBULATORY_CARE_PROVIDER_SITE_OTHER): Payer: Medicare Other | Admitting: Family Medicine

## 2019-06-25 ENCOUNTER — Other Ambulatory Visit: Payer: Self-pay

## 2019-06-25 VITALS — BP 104/75 | HR 77 | Temp 98.8°F | Resp 16 | Ht 67.0 in | Wt 214.0 lb

## 2019-06-25 DIAGNOSIS — D860 Sarcoidosis of lung: Secondary | ICD-10-CM

## 2019-06-25 DIAGNOSIS — R0683 Snoring: Secondary | ICD-10-CM | POA: Diagnosis not present

## 2019-06-25 DIAGNOSIS — Z720 Tobacco use: Secondary | ICD-10-CM

## 2019-06-25 NOTE — Progress Notes (Signed)
Patient [facility]   Progress Note: General Provider: Lanae Boast, FNP  SUBJECTIVE:   Rhonda Hester is a 43 y.o. female who  has a past medical history of Allergic asthma, Allergy, Anxiety, Breast mass (05/2017), Eczema, GERD (gastroesophageal reflux disease), History of anal fissures, History of Bell's palsy, History of gastroesophageal reflux (GERD), History of kidney stones, Hyperlipidemia, Interstitial cystitis, Sarcoidosis of lung ([place]), Seasonal allergic rhinitis, and Shortness of breath.. Patient presents today for Follow-up (patient states she hears a "crackling sound when sleeping" ) and Neck Pain (left side of neck )  Patient states that she notices a "gurgling sound" when she tries to sleep. She states that she was given a nasal steroid spray by her pulmonologist, but she states that it caused sores in her nose. She reports that famotidine did not help with the mucus and she is unsure as to how long she took it before stopping it. She endorses snoring, but only when she feels she is tired. No known apneic events reported. However, she states that she is scared that she will not be able to breathe while sleeping.  She also reports neck pain. Is followed by [facility]. She reports that an MRI will be scheduled in the next month due to her neck pain.   Review of Systems  Constitutional: Negative.   HENT: Negative.   Eyes: Negative.   Respiratory: Positive for sputum production.   Cardiovascular: Negative.   Gastrointestinal: Negative.   Genitourinary: Negative.   Musculoskeletal: Positive for neck pain.  Skin: Negative.   Neurological: Negative.   Psychiatric/Behavioral: Negative.      OBJECTIVE: BP 104/75 (BP Location: Right Arm, Patient Position: Sitting, Cuff Size: Normal)   Pulse 77   Temp 98.8 F (37.1 C) (Oral)   Resp 16   Ht 5\' 7"  (1.702 m)   Wt 214 lb (97.1 kg)   LMP 05/21/2019   SpO2 99%   BMI 33.52 kg/m    Wt Readings from Last 3 Encounters:  06/25/19 214 lb (97.1 kg)  01/30/19 204 lb 12.8 oz (92.9 kg)  11/30/18 204 lb (92.5 kg)     Physical Exam Vitals signs and nursing note reviewed.  Constitutional:      General: She is not in acute distress.    Appearance: Normal appearance.  HENT:     Head: Normocephalic and atraumatic.     Mouth/Throat:     Mouth: Mucous membranes are moist.     Pharynx: No oropharyngeal exudate or posterior oropharyngeal erythema.  Eyes:     Extraocular Movements: Extraocular movements intact.     Conjunctiva/sclera: Conjunctivae normal.     Pupils: Pupils are equal, round, and reactive to light.  Cardiovascular:     Rate and Rhythm: Normal rate and regular rhythm.     Heart sounds: No murmur.  Pulmonary:     Effort: Pulmonary effort is normal.     Breath sounds: Normal breath sounds.  Musculoskeletal: Normal range of motion.  Skin:    General: Skin is warm and dry.  Neurological:     Mental Status: She is alert and oriented to person, place, and time.  Psychiatric:        Mood and Affect: Mood normal.        Behavior: Behavior normal.        Thought Content: Thought content normal.        Judgment: Judgment normal.     ASSESSMENT/PLAN:   1. Snoring  Will order sleep study for further evaluation  - Split night study; Future  2. Sarcoidosis of lung [facility] [address]) Patient is being followed by a specialist for this condition. Patient advised to continue with follow up appointments. PCP will continue to monitor progress.  - Split night study; Future  3. Tobacco abuse Smoking cessation instruction/counseling given:  counseled patient on the dangers of tobacco use, advised patient to stop smoking, and reviewed strategies to maximize success    Return in about 6 months (around 12/25/2019) for pap.    The patient was given clear instructions to go to ER or return to medical center if symptoms do not improve, worsen or new problems develop. The patient  verbalized understanding and agreed with plan of care.   Ms. Doug Sou. Nathaneil Canary, FNP-BC Patient [facility] [address] [place], [place] [address] [phone]

## 2019-06-25 NOTE — Patient Instructions (Signed)

## 2019-07-10 ENCOUNTER — Telehealth (HOSPITAL_COMMUNITY): Payer: Self-pay

## 2019-08-29 ENCOUNTER — Ambulatory Visit (INDEPENDENT_AMBULATORY_CARE_PROVIDER_SITE_OTHER): Payer: Medicare Other | Admitting: Family Medicine

## 2019-08-29 ENCOUNTER — Encounter: Payer: Self-pay | Admitting: Family Medicine

## 2019-08-29 ENCOUNTER — Other Ambulatory Visit: Payer: Self-pay

## 2019-08-29 VITALS — BP 125/75 | HR 75 | Temp 98.4°F | Resp 16 | Ht 67.0 in | Wt 214.0 lb

## 2019-08-29 DIAGNOSIS — L709 Acne, unspecified: Secondary | ICD-10-CM

## 2019-08-29 DIAGNOSIS — N644 Mastodynia: Secondary | ICD-10-CM

## 2019-08-29 MED ORDER — EVENING PRIMROSE OIL 500 MG PO CAPS: 2000.0000 mg | ORAL_CAPSULE | Freq: Every day | ORAL | 1 refills | Status: AC

## 2019-08-29 MED ORDER — CLINDAMYCIN PHOSPHATE 1 % EX GEL: Freq: Two times a day (BID) | CUTANEOUS | 0 refills | Status: DC

## 2019-08-29 NOTE — Progress Notes (Signed)
  [facility]   Progress Note: Sick Visit Provider: Lanae Boast, FNP  SUBJECTIVE:   Rhonda Hester is a 43 y.o. female who  has a past medical history of Allergic asthma, Allergy, Anxiety, Breast mass (05/2017), Eczema, GERD (gastroesophageal reflux disease), History of anal fissures, History of Bell's palsy, History of gastroesophageal reflux (GERD), History of kidney stones, Hyperlipidemia, Interstitial cystitis, Sarcoidosis of lung ([place]), Seasonal allergic rhinitis, and Shortness of breath.. Patient presents today for Breast Mass (left side) and Acne (on face ) Patient with similar complaint in 2018 of left sided breast pain. She had diagnostic mammogram.  Impression:  No evidence of malignancy in the left breast. The patient is encouraged to perform breast exams while lying supine with her ipsilateral arm raised.  RECOMMENDATION: Bilateral screening mammogram is recommended in December 2018. She is unsure if she has had a follow up mammogram.  Patient also states that she is having acne on bilateral cheeks.   Breast ROS: positive for - new or changing breast lumps Dermatological ROS: positive for acne  OBJECTIVE: BP 125/75 (BP Location: Right Arm, Patient Position: Sitting, Cuff Size: Normal)   Pulse 75   Temp 98.4 F (36.9 C) (Oral)   Resp 16   Ht 5\' 7"  (1.702 m)   Wt 214 lb (97.1 kg)   LMP 08/20/2019   SpO2 100%   BMI 33.52 kg/m   Wt Readings from Last 3 Encounters:  08/29/19 214 lb (97.1 kg)  06/25/19 214 lb (97.1 kg)  01/30/19 204 lb 12.8 oz (92.9 kg)     Physical Exam Chest:     Breasts: Tanner Score is 5.        Right: Normal.        Left: Mass (dense glandular tissue. ) and tenderness present.    Lymphadenopathy:     Upper Body:     Right upper body: No supraclavicular, axillary or pectoral adenopathy.     Left upper body: No supraclavicular, axillary or pectoral adenopathy.     ASSESSMENT/PLAN:    1. Acne, unspecified acne type Discussed that acne may be from mask use. Discussed washing masks regularly.  - clindamycin (CLINDAGEL) 1 % gel; Apply topically 2 (two) times daily.  Dispense: 30 g; Refill: 0  2. Pain of left breast Discussed that it may be dense glandular tissue. Will order repeat mammogram for further evaluation.  - MM Digital Diagnostic Unilat L; Future - Evening Primrose Oil 500 MG CAPS; Take 4 capsules (2,000 mg total) by mouth daily.  Dispense: 120 capsule; Refill: 1        The patient was given clear instructions to go to ER or return to medical center if symptoms do not improve, worsen or new problems develop. The patient verbalized understanding and agreed with plan of care.   Ms. Doug Sou. Nathaneil Canary, FNP-BC Patient [facility] [address] [place], [place] [address] [phone]     This note has been created with Dragon speech recognition software and smart phrase technology. Any transcriptional errors are unintentional.

## 2019-08-29 NOTE — Patient Instructions (Signed)

## 2019-08-30 ENCOUNTER — Other Ambulatory Visit: Payer: Self-pay | Admitting: Family Medicine

## 2019-08-30 DIAGNOSIS — N644 Mastodynia: Secondary | ICD-10-CM

## 2019-09-02 IMAGING — MR MR MRA HEAD W/O CM
18 of 19 series · 18 of 19 positions shown · IV contrast (multihance)
Comparison: CT HEAD June 06, 2018.

CLINICAL DATA: RIGHT facial and RIGHT upper extremity numbness.
Possible slurred speech. History of Bell's palsy, hyperlipidemia.

EXAM:
MRI HEAD WITHOUT CONTRAST
MRA HEAD WITHOUT CONTRAST
MRA NECK WITHOUT AND WITH CONTRAST
TECHNIQUE: Multiplanar, multiecho pulse sequences of the brain and surrounding
structures were obtained without intravenous contrast. Angiographic
images of the Circle of Willis were obtained using MRA technique
without intravenous contrast. Angiographic images of the neck were
obtained using MRA technique without and with intravenous contrast.
Carotid stenosis measurements (when applicable) are obtained
utilizing NASCET criteria, using the distal internal carotid
diameter as the denominator.
CONTRAST:  20mL MULTIHANCE GADOBENATE DIMEGLUMINE 529 MG/ML IV SOLN

[Series 3: DWI · axial · 3.0mm · 0.94mm/px · 1 of 90 slices shown (1 of 2)]
[im 1/90]
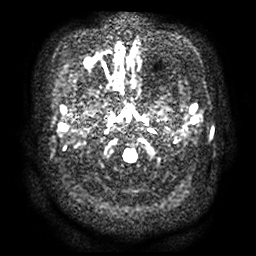

[Series 4: ax (id) 2 · axial · 1.0mm · 0.43mm/px · 1 of 176 slices shown]
[im 1/176]
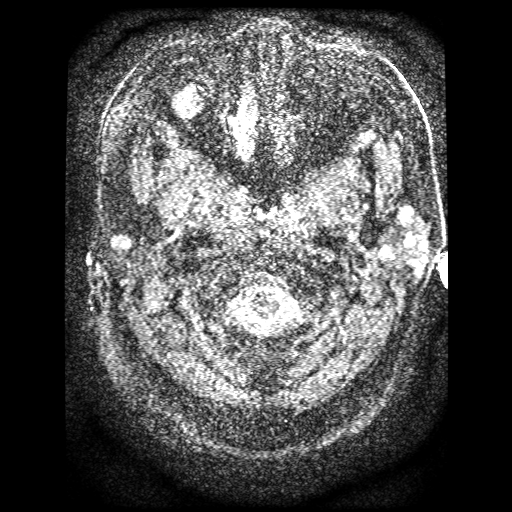

[Series 5: DWI · coronal · 4.0mm · 0.94mm/px · 1 of 72 slices shown (2 of 2)]
[im 1/72]
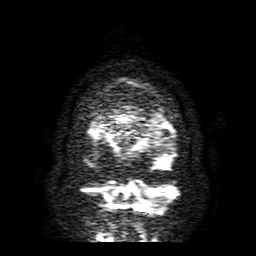

[Series 6: FLAIR · sagittal · 5.0mm · 0.47mm/px · 1 of 23 slices shown (1 of 2)]
[im 1/23]
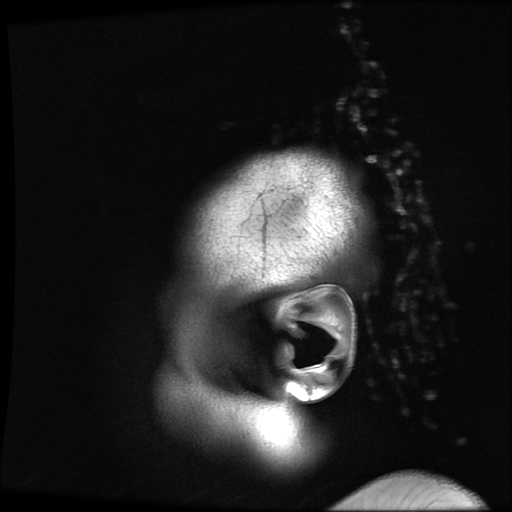

[Series 8: T2 · axial · 5.0mm · 0.47mm/px · 1 of 26 slices shown (1 of 2)]
[im 1/26]
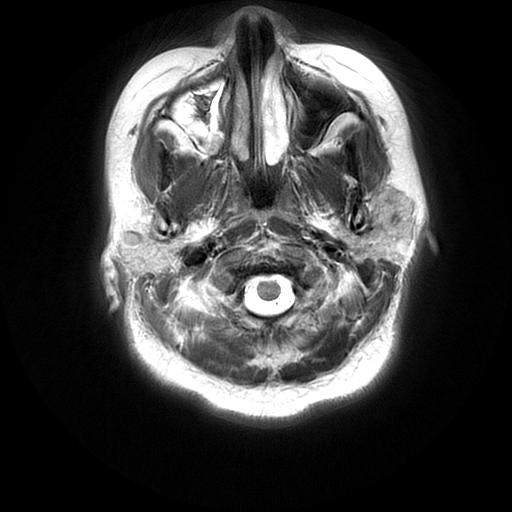

[Series 9: FLAIR · axial · 5.0mm · 0.47mm/px · 1 of 26 slices shown (2 of 2)]
[im 1/26]
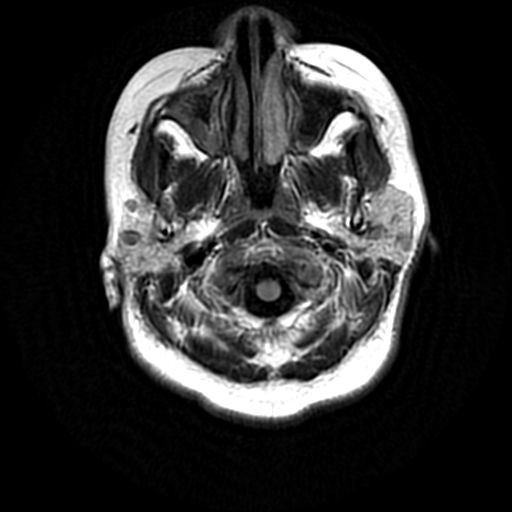

[Series 10: (person_name) · axial · 3.0mm · 0.47mm/px · 1 of 104 slices shown]
[im 1/104]
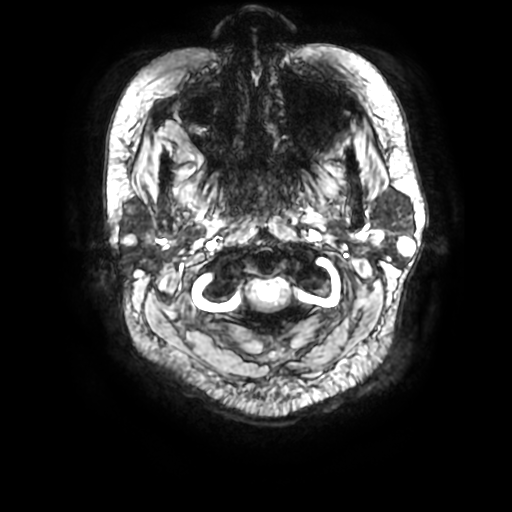

[Series 11: ax 3(person_name) · axial · 3.0mm · 0.94mm/px · 1 of 52 slices shown]
[im 1/52]
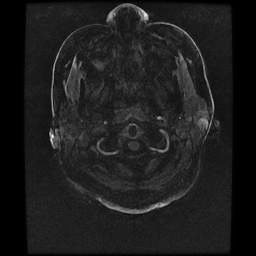

[Series 12: T2 · coronal · 5.0mm · 0.39mm/px · 1 of 29 slices shown (2 of 2)]
[im 1/29]
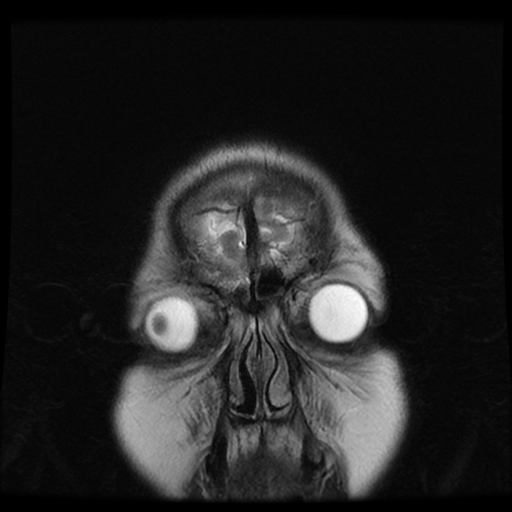

[Series 18: sag inhance (id) · sagittal · 1.2mm · 0.47mm/px · 1 of 345 slices shown]
[im 1/345]
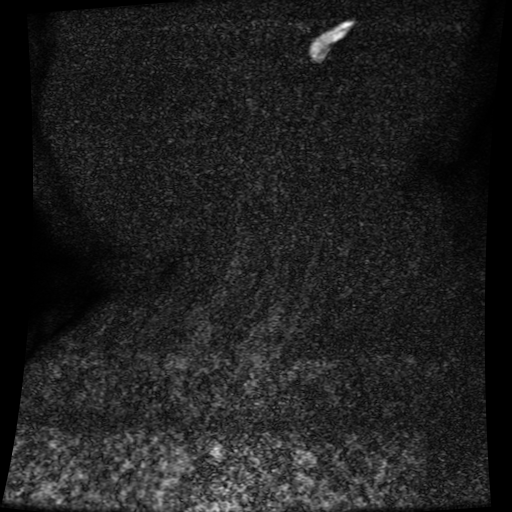

[Series 350: ADC · axial · 3.0mm · 0.94mm/px · 1 of 45 slices shown (1 of 2)]
[im 1/45]
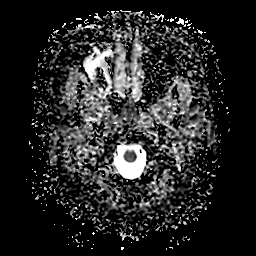

[Series 550: ADC · coronal · 4.0mm · 0.94mm/px · 1 of 36 slices shown (2 of 2)]
[im 1/36]
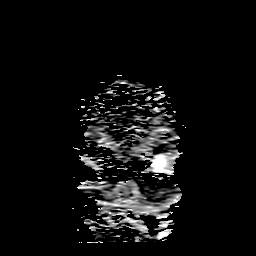

[Series 1000: filt_pha: (person_name) · axial · 3.0mm · 0.47mm/px · 1 of 104 slices shown]
[im 1/104]
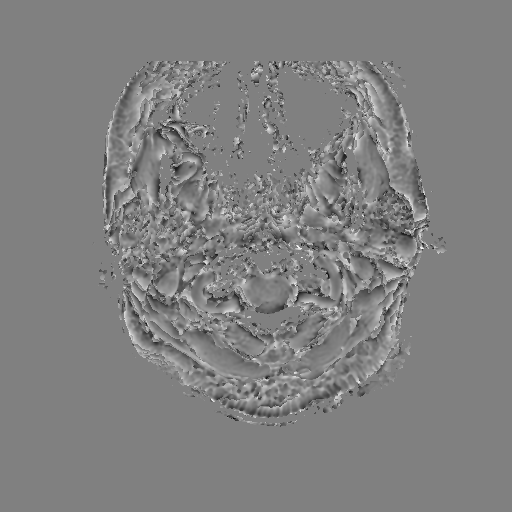

[Series 1600: cor cemra ft · coronal · 1.2mm · 0.59mm/px · 1 of 113 slices shown]
[im 1/113]
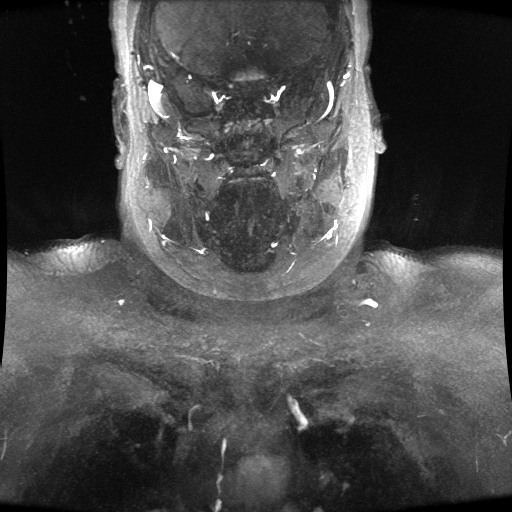

[Series 1601: ph1/cor cemra ft · coronal · 1.2mm · 0.59mm/px · 1 of 112 slices shown]
[im 1/112]
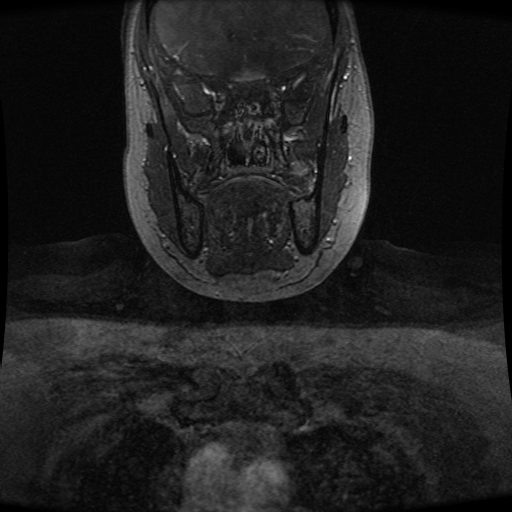

[Series 1602: ph2/cor cemra ft · coronal · 1.2mm · 0.59mm/px · 1 of 112 slices shown]
[im 1/112]
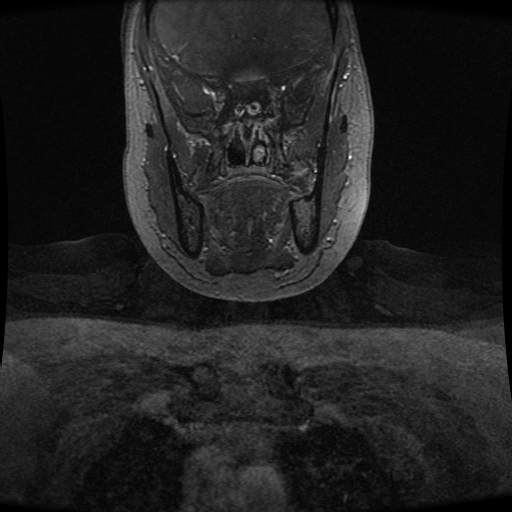

[((id)/(id)/1)-((id)/(id)/1) · coronal · 1.2mm · 0.59mm/px · 1 of 113 slices shown (1 of 2)]
[im 1/113]
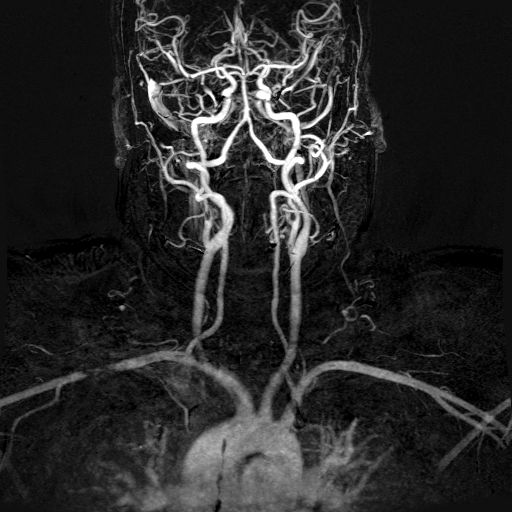

[((id)/(id)/1)-((id)/(id)/1) · coronal · 1.2mm · 0.59mm/px · 1 of 113 slices shown (2 of 2)]
[im 1/113]
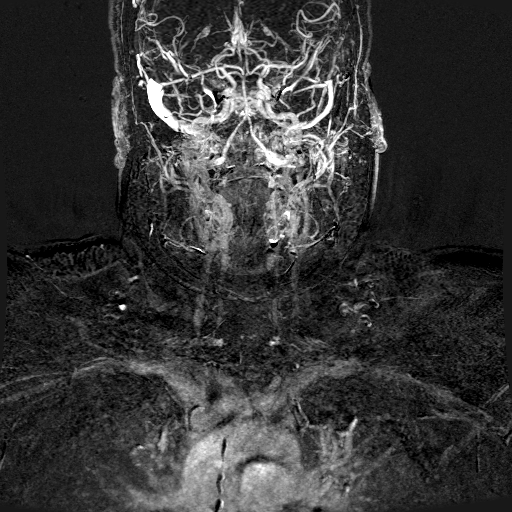

[18 of 19 positions shown; findings below may reference images not displayed]

FINDINGS: MRI HEAD FINDINGS

INTRACRANIAL CONTENTS: No reduced diffusion to suggest acute
ischemia or hyperacute demyelination. No susceptibility artifact to
suggest hemorrhage. The ventricles and sulci are normal for
patient's age. No suspicious parenchymal signal, masses, mass
effect. No abnormal extra-axial fluid collections. No extra-axial
masses.

VASCULAR: Normal major intracranial vascular flow voids present at
skull base.

SKULL AND UPPER CERVICAL SPINE: No abnormal sellar expansion. No
suspicious calvarial bone marrow signal. Craniocervical junction
maintained.

SINUSES/ORBITS: Mild RIGHT maxillary and LEFT ethmoid mucosal
thickening. Mastoid air cells are well aerated.The included ocular
globes and orbital contents are non-suspicious.

OTHER: Prominent neck lymph nodes, possibly reactive.

MRA HEAD FINDINGS

ANTERIOR CIRCULATION: Normal flow related enhancement of the
included cervical, petrous, cavernous and supraclinoid internal
carotid arteries. Patent anterior communicating artery. Patent
anterior and middle cerebral arteries, including distal segments.

No large vessel occlusion, flow limiting stenosis, aneurysm.

POSTERIOR CIRCULATION: Codominant vertebral arteries. Basilar artery
is patent, with normal flow related enhancement of the main branch
vessels. Patent posterior cerebral arteries. Patent bilateral
posterior communicating arteries present.

No large vessel occlusion, flow limiting stenosis,  aneurysm.

ANATOMIC VARIANTS: None.

Source images and MIP images were reviewed.

MRA NECK FINDINGS

ANTERIOR CIRCULATION: The common carotid arteries are widely patent
bilaterally. The carotid bifurcations are patent bilaterally without
hemodynamically significant stenosis by NASCET criteria. No flow
limiting stenosis or luminal irregularity.

POSTERIOR CIRCULATION: Bilateral vertebral arteries are patent to
the vertebrobasilar junction. No flow limiting stenosis or luminal
irregularity.

Source images and MIP images were reviewed.
IMPRESSION: 1. Normal noncontrast MRI head.
2. Normal noncontrast MRA head.
3. Normal contrast-enhanced MRA neck.

## 2019-09-05 ENCOUNTER — Encounter (HOSPITAL_COMMUNITY): Payer: Self-pay

## 2019-09-05 ENCOUNTER — Encounter (HOSPITAL_COMMUNITY): Payer: Self-pay | Admitting: *Deleted

## 2019-09-10 ENCOUNTER — Ambulatory Visit
Admission: RE | Admit: 2019-09-10 | Discharge: 2019-09-10 | Disposition: A | Discharge status: Home / Self Care | Payer: Medicare Other | Source: Ambulatory Visit | Attending: Family Medicine | Admitting: Family Medicine

## 2019-09-10 ENCOUNTER — Other Ambulatory Visit: Payer: Self-pay

## 2019-09-10 DIAGNOSIS — N644 Mastodynia: Secondary | ICD-10-CM

## 2019-10-03 DIAGNOSIS — E785 Hyperlipidemia, unspecified: Secondary | ICD-10-CM | POA: Insufficient documentation

## 2019-10-03 DIAGNOSIS — Z720 Tobacco use: Secondary | ICD-10-CM | POA: Insufficient documentation

## 2019-10-03 DIAGNOSIS — K219 Gastro-esophageal reflux disease without esophagitis: Secondary | ICD-10-CM | POA: Insufficient documentation

## 2019-10-03 DIAGNOSIS — E669 Obesity, unspecified: Secondary | ICD-10-CM | POA: Insufficient documentation

## 2019-10-03 DIAGNOSIS — L309 Dermatitis, unspecified: Secondary | ICD-10-CM | POA: Insufficient documentation

## 2019-10-03 DIAGNOSIS — J4520 Mild intermittent asthma, uncomplicated: Secondary | ICD-10-CM | POA: Insufficient documentation

## 2019-10-03 DIAGNOSIS — G894 Chronic pain syndrome: Secondary | ICD-10-CM | POA: Insufficient documentation

## 2019-10-03 DIAGNOSIS — D509 Iron deficiency anemia, unspecified: Secondary | ICD-10-CM | POA: Insufficient documentation

## 2019-10-03 DIAGNOSIS — G8929 Other chronic pain: Secondary | ICD-10-CM | POA: Insufficient documentation

## 2019-10-18 ENCOUNTER — Other Ambulatory Visit: Payer: Self-pay | Admitting: Nurse Practitioner

## 2019-10-18 DIAGNOSIS — D869 Sarcoidosis, unspecified: Secondary | ICD-10-CM

## 2019-10-25 ENCOUNTER — Ambulatory Visit
Admission: RE | Admit: 2019-10-25 | Discharge: 2019-10-25 | Disposition: A | Discharge status: Home / Self Care | Payer: Medicare Other | Source: Ambulatory Visit | Attending: Nurse Practitioner | Admitting: Nurse Practitioner

## 2019-10-25 DIAGNOSIS — D869 Sarcoidosis, unspecified: Secondary | ICD-10-CM

## 2019-11-12 DIAGNOSIS — R87619 Unspecified abnormal cytological findings in specimens from cervix uteri: Secondary | ICD-10-CM | POA: Insufficient documentation

## 2019-12-22 ENCOUNTER — Encounter (HOSPITAL_COMMUNITY): Payer: Self-pay

## 2019-12-22 ENCOUNTER — Ambulatory Visit (HOSPITAL_COMMUNITY)
Admission: EM | Admit: 2019-12-22 | Discharge: 2019-12-22 | Disposition: A | Discharge status: Home / Self Care | Payer: Medicare Other | Attending: Emergency Medicine | Admitting: Emergency Medicine

## 2019-12-22 DIAGNOSIS — M5442 Lumbago with sciatica, left side: Secondary | ICD-10-CM

## 2019-12-22 LAB — POCT URINALYSIS DIP (DEVICE)
Bilirubin Urine: NEGATIVE
Glucose, UA: NEGATIVE mg/dL
Hgb urine dipstick: NEGATIVE
Ketones, ur: NEGATIVE mg/dL
Leukocytes,Ua: NEGATIVE
Nitrite: NEGATIVE
Protein, ur: NEGATIVE mg/dL
Specific Gravity, Urine: 1.025 (ref 1.005–1.030)
Urobilinogen, UA: 1 mg/dL (ref 0.0–1.0)
pH: 6 (ref 5.0–8.0)

## 2019-12-22 MED ORDER — IBUPROFEN 800 MG PO TABS: 800.0000 mg | ORAL_TABLET | Freq: Three times a day (TID) | ORAL | 0 refills | Status: DC

## 2019-12-22 MED ORDER — METHYLPREDNISOLONE ACETATE 80 MG/ML IJ SUSP
80.0000 mg | Freq: Once | INTRAMUSCULAR | Status: AC
  Administered 2019-12-22: 16:00:00 80 mg via INTRAMUSCULAR

## 2019-12-22 MED ORDER — METHYLPREDNISOLONE ACETATE 80 MG/ML IJ SUSP
INTRAMUSCULAR | Status: AC
  Filled 2019-12-22: qty 1

## 2019-12-22 MED ORDER — CYCLOBENZAPRINE HCL 10 MG PO TABS: 10.0000 mg | ORAL_TABLET | Freq: Two times a day (BID) | ORAL | 0 refills | Status: DC | PRN

## 2019-12-22 NOTE — Discharge Instructions (Signed)
We gave you an injection of Depomedrol 80 mg today.   Use anti-inflammatories for pain/swelling. You may take up to 800 mg Ibuprofen every 8 hours with food. You may supplement Ibuprofen with Tylenol [phone] mg every 8 hours.   You may use flexeril as needed to help with pain. This is a muscle relaxer and causes sedation- please use only at bedtime or when you will be home and not have to drive/work  Follow up if pain not improving or worsening, developing leg weakness, numbness of tingling, issues with control of urination or bowels.

## 2019-12-22 NOTE — ED Triage Notes (Signed)
Pt presents to the UC with lower back pain x 3 weeks. Pt states having numbness sensation in her left buttocks on and off x 3 week. Pt denies any tingling or numbness in her legs.

## 2019-12-23 NOTE — ED Provider Notes (Signed)
[facility]    CSN: [id number] Arrival date & time: 12/22/19  1359      History   Chief Complaint Chief Complaint  Patient presents with  . Back Pain    HPI Rhonda Hester is a 43 y.o. female history of sarcoidosis, chronic back pain, tobacco history, presenting today for evaluation of low back pain.  Patient states that over the past 3 weeks she has had lower back pain bilaterally.  It has been more prominent on the left side.  Occasionally will have a numbness sensation within her left buttocks.  Denies radiation into the posterior leg.  Denies saddle anesthesia, denies loss of control of urination or bowel movements.  Describes the pain as dull.  Worse with turning and movement.  She denies any injury or fall.  Denies any increase in activity or heavy lifting recently.  She states that she is not consistently taking medicine for her pain.  She has tried ibuprofen with some relief.  Has tried oxycodone which is prescribed to her regularly without relief.  She has had bad experiences with prednisone courses and would like to defer this if possible.  She also notes that she believes she has had sensation of shortness of breath with takes taking meloxicam and she is unsure about a similar reaction with other NSAIDs.  Does not get this sensation with ibuprofen.  Denies any changes in urination, denies dysuria, hematuria.  Has had history of kidney stones, but pain was felt more in groin with past kidney stones.  HPI  Past Medical History:  Diagnosis Date  . Allergic asthma   . Allergy    SEASONAL  . Anxiety   . Breast mass 05/2017   lt breast lump  . Eczema   . GERD (gastroesophageal reflux disease)   . History of anal fissures   . History of Bell's palsy    2006  RIGHT SIDE--  RESOLVED  . History of gastroesophageal reflux (GERD)   . History of kidney stones   . Hyperlipidemia   . Interstitial cystitis   . Sarcoidosis of lung ([place])    DX 2006--  PULMOLOGIST--  DR  [id number] on 2 liters of oxygen, goes to pain clinic  . Seasonal allergic rhinitis   . Shortness of breath     Patient Active Problem List   Diagnosis Date Noted  . Enlarged submental lymph node 03/30/2018  . Nodule of soft tissue 03/22/2018  . Hemorrhoids 02/13/2018  . TMJ (temporomandibular joint disorder) 12/23/2017  . Acute pain of right shoulder 11/26/2017  . Chronic bilateral low back pain without sciatica 08/01/2017  . Chronic pain of right knee 08/01/2017  . Depression due to physical illness 04/02/2017  . Pelvic pain 03/28/2017  . Allergic reaction to food 03/19/2017  . Obesity (BMI 30.0-34.9) 03/19/2017  . Generalized pain 03/19/2017  . Metabolic syndrome [id number]  . Medication management 03/19/2017  . Tobacco abuse 01/24/2017  . Uterine fibroid 01/17/2017  . Interstitial cystitis 01/17/2017  . Menorrhagia 12/06/2016  . Acne vulgaris 05/10/2016  . BMI 31.0-31.9,adult 05/10/2016  . Weight gain 05/10/2016  . Right hip pain 10/27/2014  . Sarcoidosis of lung ([place])   . Cervical adenopathy 03/12/2014  . History of smoking 08/07/2013  . Allergic asthma 07/22/2011    Past Surgical History:  Procedure Laterality Date  . CYSTO WITH HYDRODISTENSION N/A 08/24/2013   Procedure: CYSTOSCOPY/HYDRODISTENSION with instillation of marcaine and pyridium;  Surgeon: Fredricka Bonine, MD;  Location: [facility];  Service: Urology;  Laterality: N/A;  . CYSTOSCOPY/ HYDRODISTENTION/ BLADDER BX  06-09-2010  . CYSTOSCOPY/URETEROSCOPY/HOLMIUM LASER/STENT PLACEMENT Right 01/30/2019   Procedure: CYSTOSCOPY/RETROGRADE/URETEROSCOPY/HOLMIUM LASER/STENT PLACEMENT;  Surgeon: Festus Aloe, MD;  Location: [facility];  Service: Urology;  Laterality: Right;  . DILATION AND CURETTAGE OF UTERUS  1997  . EXTRACORPOREAL SHOCK WAVE LITHOTRIPSY Right 11/30/2018   Procedure: EXTRACORPOREAL SHOCK WAVE LITHOTRIPSY (ESWL);  Surgeon: Bjorn Loser, MD;  Location: [facility];  Service: Urology;   Laterality: Right;  . IR TRANSCATHETER BX  06/14/2018  . IR US GUIDE VASC ACCESS RIGHT  06/14/2018  . IR VENOGRAM HEPATIC W HEMODYNAMIC EVALUATION  06/14/2018    OB History    Gravida  1   Para  0   Term  0   Preterm  0   AB  1   Living  0     SAB  1   TAB  0   Ectopic  0   Multiple  0   Live Births               Home Medications    Prior to Admission medications   Medication Sig Start Date End Date Taking? Authorizing Provider  albuterol (PROAIR HFA) 108 (90 Base) MCG/ACT inhaler Inhale 2 puffs into the lungs every 6 (six) hours as needed for shortness of breath. 05/10/18   Dorena Dew, FNP  Cetirizine HCl 10 MG CAPS Take 1 capsule (10 mg total) by mouth daily. 05/11/19   Ellary Casamento C, PA-C  cyclobenzaprine (FLEXERIL) 10 MG tablet Take 1 tablet (10 mg total) by mouth 2 (two) times daily as needed for muscle spasms. 12/22/19   Eugena Rhue C, PA-C  folic acid (FOLVITE) 1 MG tablet Take 1 mg by mouth every Wednesday.     [provider]  ibuprofen (ADVIL) 800 MG tablet Take 1 tablet (800 mg total) by mouth 3 (three) times daily. With food 12/22/19   Jasper Hanf C, PA-C  meloxicam (MOBIC) 15 MG tablet Take 15 mg by mouth daily as needed for pain.  09/14/18   [provider]  methotrexate (RHEUMATREX) 2.5 MG tablet Take 2.5 mg by mouth every Wednesday. Caution: Chemotherapy. Protect from light.    [provider]  Oxycodone HCl 10 MG TABS Take 10 mg by mouth every 4 (four) hours as needed (pain).    [provider]  famotidine (PEPCID) 20 MG tablet Take 1 tablet (20 mg total) by mouth 2 (two) times daily. 05/24/19 12/22/19  Lanae Boast, FNP    Family History Family History  Problem Relation Age of Onset  . Brain cancer Maternal Grandmother   . Hyperlipidemia Other   . Sleep apnea Other   . Depression Maternal Aunt   . Seizures Maternal Uncle   . Colon cancer Neg Hx   . Stomach cancer Neg Hx     Social  History Social History   Tobacco Use  . Smoking status: Former Smoker    Packs/day: 0.30    Years: 6.00    Pack years: 1.80    Types: Cigarettes, Cigars    Quit date: 11/2016    Years since quitting: 3.0  . Smokeless tobacco: Never Used  . Tobacco comment: ONE CIG. DAILY /  1 PPMONTH  Substance Use Topics  . Alcohol use: No    Alcohol/week: 0.0 standard drinks  . Drug use: No    Comment: HX MARIJUANA USE     Allergies   Acyclovir and related, Citric acid, Dust  mite extract, Grapeseed extract [nutritional supplements], Pineapple, Pollen extract, and Nickel   Review of Systems Review of Systems  Constitutional: Negative for fatigue and fever.  HENT: Negative for mouth sores.   Eyes: Negative for visual disturbance.  Respiratory: Negative for shortness of breath.   Cardiovascular: Negative for chest pain.  Gastrointestinal: Negative for abdominal pain, nausea and vomiting.  Genitourinary: Negative for decreased urine volume and difficulty urinating.  Musculoskeletal: Positive for back pain and myalgias. Negative for arthralgias and joint swelling.  Skin: Negative for color change, rash and wound.  Neurological: Negative for dizziness, weakness, light-headedness and headaches.     Physical Exam Triage Vital Signs ED Triage Vitals  Enc Vitals Group     BP 12/22/19 1510 (!) 145/96     Pulse Rate 12/22/19 1510 84     Resp 12/22/19 1510 16     Temp 12/22/19 1510 98.3 F (36.8 C)     Temp Source 12/22/19 1510 Oral     SpO2 12/22/19 1510 96 %     Weight --      Height --      Head Circumference --      Peak Flow --      Pain Score 12/22/19 1506 8     Pain Loc --      Pain Edu? --      Excl. in [place]? --    No data found.  Updated Vital Signs BP (!) 145/96 (BP Location: Right Arm)   Pulse 84   Temp 98.3 F (36.8 C) (Oral)   Resp 16   LMP 12/16/2019 (Exact Date)   SpO2 96%   Visual Acuity Right Eye Distance:   Left Eye Distance:   Bilateral Distance:     Right Eye Near:   Left Eye Near:    Bilateral Near:     Physical Exam Vitals and nursing note reviewed.  Constitutional:      Appearance: She is well-developed.     Comments: No acute distress  HENT:     Head: Normocephalic and atraumatic.     Nose: Nose normal.  Eyes:     Conjunctiva/sclera: Conjunctivae normal.  Cardiovascular:     Rate and Rhythm: Normal rate.  Pulmonary:     Effort: Pulmonary effort is normal. No respiratory distress.  Abdominal:     General: There is no distension.  Musculoskeletal:        General: Normal range of motion.     Cervical back: Neck supple.     Comments: Nontender to palpation of cervical, thoracic and lumbar spine midline.  Increased tenderness throughout left paraspinal and lateral lumbar and thoracic musculature.  Strength at hips and knees 5/5 and equal bilaterally Patellar reflex 2+ bilaterally  Skin:    General: Skin is warm and dry.  Neurological:     Mental Status: She is alert and oriented to person, place, and time.      UC Treatments / Results  Labs (all labs ordered are listed, but only abnormal results are displayed) Labs Reviewed  POCT URINALYSIS DIP (DEVICE)    EKG   Radiology No results found.  Procedures Procedures (including critical care time)  Medications Ordered in UC Medications  methylPREDNISolone acetate (DEPO-MEDROL) injection 80 mg (80 mg Intramuscular Given 12/22/19 1614)    Initial Impression / Assessment and Plan / UC Course  I have reviewed the triage vital signs and the nursing notes.  Pertinent labs & imaging results that were available during my care of  the patient were reviewed by me and considered in my medical decision making (see chart for details).     UA unremarkable.  Likely acute on chronic back pain and likely MSK cause.  No mechanism of injury, do not suspect acute bony abnormality.  Patient prefers to defer oral steroids, will provide Depo-Medrol IM today followed by  continuing to use ibuprofen and Tylenol along with muscle relaxers.  No red flags for cauda equina, no neuro deficits.  Continue to monitor,Discussed strict return precautions. Patient verbalized understanding and is agreeable with plan.  Final Clinical Impressions(s) / UC Diagnoses   Final diagnoses:  Acute left-sided low back pain with left-sided sciatica     Discharge Instructions     We gave you an injection of Depomedrol 80 mg today.   Use anti-inflammatories for pain/swelling. You may take up to 800 mg Ibuprofen every 8 hours with food. You may supplement Ibuprofen with Tylenol [phone] mg every 8 hours.   You may use flexeril as needed to help with pain. This is a muscle relaxer and causes sedation- please use only at bedtime or when you will be home and not have to drive/work  Follow up if pain not improving or worsening, developing leg weakness, numbness of tingling, issues with control of urination or bowels.     ED Prescriptions    Medication Sig Dispense Auth. Provider   ibuprofen (ADVIL) 800 MG tablet Take 1 tablet (800 mg total) by mouth 3 (three) times daily. With food 21 tablet Inetha Maret C, PA-C   cyclobenzaprine (FLEXERIL) 10 MG tablet Take 1 tablet (10 mg total) by mouth 2 (two) times daily as needed for muscle spasms. 20 tablet Mahasin Riviere, [place] C, PA-C     I have reviewed the PDMP during this encounter.   Janith Lima, [place] 12/23/19 [phone]

## 2019-12-26 ENCOUNTER — Ambulatory Visit: Payer: Self-pay | Admitting: Family Medicine

## 2020-01-02 ENCOUNTER — Emergency Department (HOSPITAL_COMMUNITY)
Admission: EM | Admit: 2020-01-02 | Discharge: 2020-01-03 | Disposition: A | Discharge status: Home / Self Care | Payer: Medicare Other | Attending: Emergency Medicine | Admitting: Emergency Medicine

## 2020-01-02 ENCOUNTER — Other Ambulatory Visit: Payer: Self-pay

## 2020-01-02 DIAGNOSIS — M549 Dorsalgia, unspecified: Secondary | ICD-10-CM | POA: Diagnosis present

## 2020-01-02 DIAGNOSIS — Z5321 Procedure and treatment not carried out due to patient leaving prior to being seen by health care provider: Secondary | ICD-10-CM | POA: Diagnosis not present

## 2020-01-02 NOTE — ED Notes (Signed)
Pt. Refused to wait in lobby any longer, cut off armband, took stickers and LWBS

## 2020-01-02 NOTE — ED Triage Notes (Signed)
Onset 3 weeks ago left sided back pain.  No known injury.  Seen at u/c and was prescribed meds with no relief.

## 2020-01-07 ENCOUNTER — Ambulatory Visit (HOSPITAL_COMMUNITY)
Admission: RE | Admit: 2020-01-07 | Discharge: 2020-01-07 | Disposition: A | Discharge status: Home / Self Care | Payer: Medicare Other | Source: Ambulatory Visit | Attending: Nurse Practitioner | Admitting: Nurse Practitioner

## 2020-01-07 ENCOUNTER — Ambulatory Visit (INDEPENDENT_AMBULATORY_CARE_PROVIDER_SITE_OTHER): Payer: Medicare Other | Admitting: Nurse Practitioner

## 2020-01-07 ENCOUNTER — Encounter: Payer: Self-pay | Admitting: Nurse Practitioner

## 2020-01-07 ENCOUNTER — Emergency Department (HOSPITAL_COMMUNITY)
Admission: EM | Admit: 2020-01-07 | Discharge: 2020-01-07 | Discharge status: Absent without leave | Payer: Medicare Other

## 2020-01-07 ENCOUNTER — Ambulatory Visit: Payer: Self-pay | Admitting: Nurse Practitioner

## 2020-01-07 ENCOUNTER — Telehealth: Payer: Self-pay | Admitting: Nurse Practitioner

## 2020-01-07 ENCOUNTER — Other Ambulatory Visit: Payer: Self-pay

## 2020-01-07 ENCOUNTER — Telehealth: Payer: Self-pay

## 2020-01-07 VITALS — BP 129/81 | HR 102 | Temp 98.7°F | Resp 16 | Ht 67.0 in | Wt 227.0 lb

## 2020-01-07 DIAGNOSIS — M545 Low back pain, unspecified: Secondary | ICD-10-CM

## 2020-01-07 DIAGNOSIS — R21 Rash and other nonspecific skin eruption: Secondary | ICD-10-CM

## 2020-01-07 LAB — POCT URINALYSIS DIPSTICK
Bilirubin, UA: NEGATIVE
Blood, UA: NEGATIVE
Glucose, UA: NEGATIVE
Ketones, UA: NEGATIVE
Leukocytes, UA: NEGATIVE
Nitrite, UA: NEGATIVE
Protein, UA: POSITIVE — AB
Spec Grav, UA: >=1.03 — AB (ref 1.010–1.025)
Urobilinogen, UA: 1 E.U./dL
pH, UA: 6 (ref 5.0–8.0)

## 2020-01-07 MED ORDER — METAXALONE 800 MG PO TABS: 800.0000 mg | ORAL_TABLET | Freq: Three times a day (TID) | ORAL | 0 refills | Status: AC

## 2020-01-07 MED ORDER — TRIAMCINOLONE ACETONIDE 0.1 % EX CREA: 1.0000 "application " | TOPICAL_CREAM | Freq: Two times a day (BID) | CUTANEOUS | 0 refills | Status: DC

## 2020-01-07 MED ORDER — TIZANIDINE HCL 4 MG PO TABS: 4.0000 mg | ORAL_TABLET | Freq: Four times a day (QID) | ORAL | 0 refills | Status: DC | PRN

## 2020-01-07 NOTE — Telephone Encounter (Signed)
-----   Message from Vevelyn Francois, NP sent at 01/07/2020  3:14 PM EST ----- Normal Xray  Will have patient complete one week of muscle relaxer. Pending Urine culture. If pain is not resolved will have patient have renal US to further evaluate flank/back pain.

## 2020-01-07 NOTE — Progress Notes (Signed)
Present  Acute Office Visit  Subjective:    Patient ID: Rhonda Hester, female    DOB: [birth year], 44 y.o.   MRN: [id number]  Chief Complaint  Patient presents with  . Back Pain  . Rash    rash on face     HPI Patient is in today for increased left-sided back pain. She was seen in urgent care . She was given flexeril and [employer]. She did follow up again at the ER but did not stay because of the wait. She admits that it is sensitive to touch. She denies any radiating pain. She denies any numbness or tingiling. She admits the pain started gradually approximately 3 weeks ago. She denies any injury.  She admits that lying down makes the pain worse but she is able to find a position sometimes it does seem to help. She did have a "urine" completed at urgent care which was negative. She denies any urinary symptoms of frequency, urgency, dysuria or blood in her urine. She admits that she was diagnosed with sarcoidosis in 2006. She is currently on disability. She does not like to take a lot of medication because it does affect her breathing. She was also given an injection in urgent care however is unsure of what it was. She currently does go to pain management however does not use her pain medication as directed. She does not feel like it is completely effective. She admits that prior to the coronavirus pandemic she was to be referred to another pain clinic. However this did not occur.  She has also noticed a facial rash on her chin and behind her ears. She feels like this problem has been ongoing. She was given a gel to use in the past which was not effective.She does not feel like this is related to wearing a mask because she stays home a lot.     Past Medical History:  Diagnosis Date  . Allergic asthma   . Allergy    SEASONAL  . Anxiety   . Breast mass 05/2017   lt breast lump  . Eczema   . GERD (gastroesophageal reflux disease)   . History of anal fissures   . History of Bell's palsy    2006  RIGHT SIDE--  RESOLVED  . History of gastroesophageal reflux (GERD)   . History of kidney stones   . Hyperlipidemia   . Interstitial cystitis   . Sarcoidosis of lung ([place])    DX 2006--  PULMOLOGIST--  DR [id number] on 2 liters of oxygen, goes to pain clinic  . Seasonal allergic rhinitis   . Shortness of breath     Past Surgical History:  Procedure Laterality Date  . CYSTO WITH HYDRODISTENSION N/A 08/24/2013   Procedure: CYSTOSCOPY/HYDRODISTENSION with instillation of marcaine and pyridium;  Surgeon: Fredricka Bonine, MD;  Location: [facility];  Service: Urology;  Laterality: N/A;  . CYSTOSCOPY/ HYDRODISTENTION/ BLADDER BX  06-09-2010  . CYSTOSCOPY/URETEROSCOPY/HOLMIUM LASER/STENT PLACEMENT Right 01/30/2019   Procedure: CYSTOSCOPY/RETROGRADE/URETEROSCOPY/HOLMIUM LASER/STENT PLACEMENT;  Surgeon: Festus Aloe, MD;  Location: [facility];  Service: Urology;  Laterality: Right;  . DILATION AND CURETTAGE OF UTERUS  1997  . EXTRACORPOREAL SHOCK WAVE LITHOTRIPSY Right 11/30/2018   Procedure: EXTRACORPOREAL SHOCK WAVE LITHOTRIPSY (ESWL);  Surgeon: Bjorn Loser, MD;  Location: [facility];  Service: Urology;  Laterality: Right;  . IR TRANSCATHETER BX  06/14/2018  . IR US GUIDE VASC ACCESS RIGHT  06/14/2018  . IR VENOGRAM HEPATIC W HEMODYNAMIC EVALUATION  06/14/2018  Family History  Problem Relation Age of Onset  . Brain cancer Maternal Grandmother   . Hyperlipidemia Other   . Sleep apnea Other   . Depression Maternal Aunt   . Seizures Maternal Uncle   . Colon cancer Neg Hx   . Stomach cancer Neg Hx     Social History   Socioeconomic History  . Marital status: Single    Spouse name: Not on file  . Number of children: 0  . Years of education: Not on file  . Highest education level: Not on file  Occupational History  . Occupation: disabled  Tobacco Use  . Smoking status: Former Smoker    Packs/day: 0.30    Years: 6.00    Pack years: 1.80    Types: Cigarettes,  Cigars    Quit date: 11/2016    Years since quitting: 3.1  . Smokeless tobacco: Never Used  . Tobacco comment: ONE CIG. DAILY /  1 PPMONTH  Substance and Sexual Activity  . Alcohol use: No    Alcohol/week: 0.0 standard drinks  . Drug use: No    Comment: HX MARIJUANA USE  . Sexual activity: Never    Birth control/protection: None    Comment: abstinence   Other Topics Concern  . Not on file  Social History Narrative  . Not on file   Social Determinants of Health   Financial Resource Strain:   . Difficulty of Paying Living Expenses: Not on file  Food Insecurity:   . Worried About Charity fundraiser in the Last Year: Not on file  . Ran Out of Food in the Last Year: Not on file  Transportation Needs:   . Lack of Transportation (Medical): Not on file  . Lack of Transportation (Non-Medical): Not on file  Physical Activity:   . Days of Exercise per Week: Not on file  . Minutes of Exercise per Session: Not on file  Stress:   . Feeling of Stress : Not on file  Social Connections:   . Frequency of Communication with Friends and Family: Not on file  . Frequency of Social Gatherings with Friends and Family: Not on file  . Attends Religious Services: Not on file  . Active Member of Clubs or Organizations: Not on file  . Attends Archivist Meetings: Not on file  . Marital Status: Not on file  Intimate Partner Violence:   . Fear of Current or Ex-Partner: Not on file  . Emotionally Abused: Not on file  . Physically Abused: Not on file  . Sexually Abused: Not on file    Outpatient Medications Prior to Visit  Medication Sig Dispense Refill  . albuterol (PROAIR HFA) 108 (90 Base) MCG/ACT inhaler Inhale 2 puffs into the lungs every 6 (six) hours as needed for shortness of breath. 1 Inhaler 5  . Cetirizine HCl 10 MG CAPS Take 1 capsule (10 mg total) by mouth daily. 20 capsule 0  . ibuprofen (ADVIL) 800 MG tablet Take 1 tablet (800 mg total) by mouth 3 (three) times daily. With  food 21 tablet 0  . meloxicam (MOBIC) 15 MG tablet Take 15 mg by mouth daily as needed for pain.   0  . Oxycodone HCl 10 MG TABS Take 10 mg by mouth every 4 (four) hours as needed (pain).    . cyclobenzaprine (FLEXERIL) 10 MG tablet Take 1 tablet (10 mg total) by mouth 2 (two) times daily as needed for muscle spasms. (Patient not taking: Reported on 01/07/2020) 20  tablet 0  . folic acid (FOLVITE) 1 MG tablet Take 1 mg by mouth every Wednesday.     . methotrexate (RHEUMATREX) 2.5 MG tablet Take 2.5 mg by mouth every Wednesday. Caution: Chemotherapy. Protect from light.     Facility-Administered Medications Prior to Visit  Medication Dose Route Frequency Provider Last Rate Last Admin  . bupivacaine (MARCAINE) 0.5 % 15 mL, phenazopyridine (PYRIDIUM) 400 mg bladder mixture   Bladder Instillation Once Festus Aloe, MD      . leuprolide ([name]) injection 11.25 mg  11.25 mg Intramuscular Q90 days Hulan Fray, Myra C, MD   11.25 mg at 03/01/18 0945  . leuprolide ([name]) injection 3.75 mg  3.75 mg Intramuscular Once Denney, Rachelle A, CNM        Allergies  Allergen Reactions  . Acyclovir And Related Other (See Comments)    Patient states it "made me feel like I can't breath"   . Citric Acid Other (See Comments)    AVOIDS DUE TO INTERSTITIAL CYSTITIS  . Dust Mite Extract Itching and Other (See Comments)     coughing  . Grapeseed Extract [Nutritional Supplements] Other (See Comments)    Throat itching and burning  . Pineapple Itching    Burning in mouth and tingling lips  . Pollen Extract Itching and Other (See Comments)     coughing  . Nickel Rash    Review of Systems  Constitutional: Negative.   HENT: Negative.   Eyes: Negative.   Respiratory: Negative.   Cardiovascular: Negative.   Gastrointestinal: Negative.   Endocrine: Negative.   Genitourinary: Positive for flank pain.  Musculoskeletal: Positive for back pain.  Allergic/Immunologic: Positive for environmental allergies.    Neurological: Negative.   Hematological: Negative.   Psychiatric/Behavioral: Negative.        Objective:    Physical Exam Constitutional:      Appearance: She is obese.  HENT:     Head: Normocephalic and atraumatic.     Nose: Nose normal.     Mouth/Throat:     Mouth: Mucous membranes are dry.     Pharynx: Oropharynx is clear.  Eyes:     Pupils: Pupils are equal, round, and reactive to light.  Cardiovascular:     Rate and Rhythm: Normal rate.  Pulmonary:     Effort: Pulmonary effort is normal.  Abdominal:     Palpations: Abdomen is soft.     Tenderness: There is left CVA tenderness.  Musculoskeletal:        General: Tenderness present.     Cervical back: Normal range of motion.  Skin:    General: Skin is warm and dry.          Comments: Well healed scar from previous laceration made a friend  Neurological:     Mental Status: She is alert.     BP 129/81 (BP Location: Right Arm, Patient Position: Sitting, Cuff Size: Large)   Pulse (!) 102   Temp 98.7 F (37.1 C) (Oral)   Resp 16   Ht 5\' 7"  (1.702 m)   Wt 227 lb (103 kg)   LMP 12/16/2019 (Exact Date)   SpO2 97%   BMI 35.55 kg/m  Wt Readings from Last 3 Encounters:  01/07/20 227 lb (103 kg)  08/29/19 214 lb (97.1 kg)  06/25/19 214 lb (97.1 kg)    Health Maintenance Due  Topic Date Due  . PAP SMEAR-Modifier  06/15/2019    There are no preventive care reminders to display for this patient.  Lab Results  Component Value Date   TSH 1.14 05/10/2016   Lab Results  Component Value Date   WBC 5.3 01/30/2019   HGB 11.2 (L) 01/30/2019   HCT 33.0 (L) 01/30/2019   MCV 90.4 01/30/2019   PLT 267 01/30/2019   Lab Results  Component Value Date   NA 142 01/30/2019   K 3.5 01/30/2019   CO2 25 11/11/2018   GLUCOSE 91 01/30/2019   BUN 7 11/11/2018   CREATININE 0.67 11/11/2018   BILITOT 1.2 11/11/2018   ALKPHOS 195 (H) 11/11/2018   AST 40 11/11/2018   ALT 45 (H) 11/11/2018   PROT 8.2 (H) 11/11/2018    ALBUMIN 3.4 (L) 11/11/2018   CALCIUM 8.9 11/11/2018   ANIONGAP 9 11/11/2018   GFR 138.73 04/25/2018   Lab Results  Component Value Date   CHOL 152 08/09/2017   Lab Results  Component Value Date   HDL 38 (L) 08/09/2017   Lab Results  Component Value Date   LDLCALC 96 08/09/2017   Lab Results  Component Value Date   TRIG 90 08/09/2017   Lab Results  Component Value Date   CHOLHDL 4.0 08/09/2017   Lab Results  Component Value Date   HGBA1C 4.9 05/10/2018       Assessment & Plan:   Problem List Items Addressed This Visit    None    Visit Diagnoses    Acute left-sided low back pain, unspecified whether sciatica present    -  Primary   Relevant Medications   metaxalone (SKELAXIN) 800 MG tablet   tiZANidine (ZANAFLEX) 4 MG tablet   Other Relevant Orders   Urinalysis Dipstick (Completed)   Ambulatory referral to Pain Clinic   DG Lumbar Spine Complete (Completed)   Urine Culture   Facial rash           Meds ordered this encounter  Medications  . metaxalone (SKELAXIN) 800 MG tablet    Sig: Take 1 tablet (800 mg total) by mouth 3 (three) times daily for 10 days.    Dispense:  30 tablet    Refill:  0    Do not place this medication, or any other prescription from our practice, on "Automatic Refill". Patient may have prescription filled one day early if pharmacy is closed on scheduled refill date.    Order Specific Question:   Supervising Provider    Answer:   Tresa Garter [id number]  . triamcinolone cream (KENALOG) 0.1 %    Sig: Apply 1 application topically 2 (two) times daily.    Dispense:  456.6 g    Refill:  0    Order Specific Question:   Supervising Provider    Answer:   Tresa Garter [id number]  . tiZANidine (ZANAFLEX) 4 MG tablet    Sig: Take 1 tablet (4 mg total) by mouth every 6 (six) hours as needed for muscle spasms.    Dispense:  30 tablet    Refill:  0    Order Specific Question:   Supervising Provider    Answer:   Tresa Garter [id number]     Vevelyn Francois, NP

## 2020-01-07 NOTE — Telephone Encounter (Signed)
Called and spoke with patient. Advised that muscle relaxer has been changed to tizanidine and to try this for 1 week. Advised we have sent urine off for culture and will let her know if this grows anything. Advised if pain is not resolved in 1 week we will recommend an Ultrasound for further evaluation. Thanks!

## 2020-01-07 NOTE — Telephone Encounter (Signed)
Hey Crystal, I spoke with patient she states her insurance is not covering the skelaxin. Is there something you can change this to? We can manually fax to [place] on [address]

## 2020-01-07 NOTE — Progress Notes (Signed)
Normal Xray  Will have patient complete one week of muscle relaxer. Pending Urine culture. If pain is not resolved will have patient have renal US to further evaluate flank/back pain.

## 2020-01-07 NOTE — Patient Instructions (Addendum)
Sarcoidosis ° °Sarcoidosis is a disease that can cause inflammation in many areas of the body. It most often affects the lungs (pulmonary sarcoidosis). Sarcoidosis can also affect the lymph nodes, liver, eyes, skin, heart, or any other body tissue. °Normally, cells that are part of your body's disease-fighting system (immune system) attack harmful substances (such as germs) in your body. This immune system response causes inflammation. After the harmful substance is destroyed, the inflammation and the immune cells go away. When you have sarcoidosis, your immune system causes inflammation even when there are no harmful substances, and the inflammation does not go away. Sarcoidosis also causes cells from your immune system to form small clumps of tissue (granulomas) in the affected area of your body. °What are the causes? °The exact cause of sarcoidosis is not known.  °It is possible that if you have a family history of this disease (genetic predisposition), the immune system response that leads to inflammation may be triggered by something in your environment, such as: °· Bacteria or viruses. °· Metals. °· Chemicals. °· Dust. °· Mold or mildew. °What increases the risk? °You may be at a greater risk for sarcoidosis if you: °· Have a family history of the disease. °· Are African-American. °· Are of Northern European descent. °· Are 20-50 years old. °· Work as a firefighter. °· Work in an environment where you are exposed to metals, chemicals, mold or mildew, or insecticides. °What are the signs or symptoms? °Some people with sarcoidosis have no symptoms. Others have very mild symptoms. The symptoms usually depend on the organ that is affected. Sarcoidosis most often affects the lungs, which may include symptoms such as: °· Chest pain. °· Coughing. °· Wheezing. °· Shortness of breath. °Other common symptoms include: °· Night sweats. °· Fever. °· Weight loss. °· Fatigue. °· Swollen lymph nodes. °· Joint pain. °How is  this diagnosed? °Sarcoidosis may be diagnosed based on: °· Your symptoms and medical history. °· A physical exam. °· Imaging tests to check for granulomas such as: °? Chest X-ray. °? CT scan. °? MRI. °? PET scan. °· Lung function tests. These tests evaluate your breathing and check for problems that may be related to sarcoidosis. °· A procedure to remove a tissue sample for testing (biopsy). You may have a biopsy of lung tissue if that is where you are having symptoms. °You may have tests to check for any complications of the condition. These tests may include: °· Eye exams. °· MRI of the heart or brain. °· Echocardiogram. °· Electrocardiogram (EKG or ECG). °How is this treated? °In some cases, sarcoidosis does not require a specific treatment because it causes no symptoms or mild symptoms. If your symptoms bother you or are severe, you may be prescribed medicines to reduce inflammation or relieve symptoms. These medicines may include: °· Prednisone. This is a steroid that reduces inflammation related to sarcoidosis. °· Hydroxychloroquine. This may be used to treat sarcoidosis that affects the skin, eyes, or brain. °· Methotrexate, leflunomide, or azathioprine. These medicines affect the immune system and can help with sarcoidosis in the joints, eyes, skin, or lungs. °· Medicines that you breathe in (inhalers). Inhalers can help you breathe if sarcoidosis affects your lungs. °Follow these instructions at home: ° °· Do not use any products that contain nicotine or tobacco, such as cigarettes and e-cigarettes. If you need help quitting, ask your health care provider. °· Avoid secondhand smoke and irritating dust or chemicals. Stay indoors on days when air quality is poor   in your area.  Return to your normal activities as told by your health care provider. Ask your health care provider what activities are safe for you.  Take or use over-the-counter and prescription medicines only as told by your health care  provider.  Keep all follow-up visits as told by your health care provider. This is important. Contact a health care provider if:  You have vision problems.  You have a dry cough that does not go away.  You have an irregular heartbeat.  You have pain or aches in your joints, hands, or feet.  You have an unexplained rash. Get help right away if:  You have chest pain.  You have difficulty breathing. Summary  Sarcoidosis is a disease that can cause inflammation in many body areas of the body. It most often affects the lungs (pulmonary sarcoidosis). It can also affect the lymph nodes, liver, eyes, skin, heart, or any other body tissue.  When you have sarcoidosis, cells from your immune system form small clumps of tissue (granulomas) in the affected area of your body.  Sarcoidosis sometimes does not require a specific treatment because it causes no symptoms or mild symptoms.  If your symptoms bother you or are severe, you may be prescribed medicines to reduce inflammation or relieve symptoms. This information is not intended to replace advice given to you by your health care provider. Make sure you discuss any questions you have with your health care provider. Document Revised: 11/25/2017 Document Reviewed: 09/20/2017 Elsevier Patient Education  [place]. Flank Pain, Adult Flank pain is pain that is located on the side of the body between the upper abdomen and the back. This area is called the flank. The pain may occur over a short period of time (acute), or it may be long-term or recurring (chronic). It may be mild or severe. Flank pain can be caused by many things, including:  Muscle soreness or injury.  Kidney stones or kidney disease.  Stress.  A disease of the spine (vertebral disk disease).  A lung infection (pneumonia).  Fluid around the lungs (pulmonary edema).  A skin rash caused by the chickenpox virus (shingles).  Tumors that affect the back of the  abdomen.  Gallbladder disease. Follow these instructions at home:   Drink enough fluid to keep your urine clear or pale yellow.  Rest as told by your health care provider.  Take over-the-counter and prescription medicines only as told by your health care provider.  Keep a journal to track what has caused your flank pain and what has made it feel better.  Keep all follow-up visits as told by your health care provider. This is important. Contact a health care provider if:  Your pain is not controlled with medicine.  You have new symptoms.  Your pain gets worse.  You have a fever.  Your symptoms last longer than 2-3 days.  You have trouble urinating or you are urinating very frequently. Get help right away if:  You have trouble breathing or you are short of breath.  Your abdomen hurts or it is swollen or red.  You have nausea or vomiting.  You feel faint or you pass out.  You have blood in your urine. Summary  Flank pain is pain that is located on the side of the body between the upper abdomen and the back.  The pain may occur over a short period of time (acute), or it may be long-term or recurring (chronic). It may be mild or  severe.  Flank pain can be caused by many things.  Contact your health care provider if your symptoms get worse or they last longer than 2-3 days. This information is not intended to replace advice given to you by your health care provider. Make sure you discuss any questions you have with your health care provider. Document Revised: 11/25/2017 Document Reviewed: 02/25/2017 Elsevier Patient Education  [place].  Allergies, Adult An allergy is when your body's defense system (immune system) overreacts to an otherwise harmless substance (allergen) that you breathe in or eat or something that touches your skin. When you come into contact with something that you are allergic to, your immune system produces certain proteins (antibodies).  These proteins cause cells to release chemicals (histamines) that trigger the symptoms of an allergic reaction. Allergies often affect the nasal passages (allergic rhinitis), eyes (allergic conjunctivitis), skin (atopic dermatitis), and stomach. Allergies can be mild or severe. Allergies cannot spread from person to person (are not contagious). They can develop at any age and may be outgrown. What increases the risk? You may be at greater risk of allergies if other people in your family have allergies. What are the signs or symptoms? Symptoms depend on what type of allergy you have. They may include:  Runny, stuffy nose.  Sneezing.  Itchy mouth, ears, or throat.  Postnasal drip.  Sore throat.  Itchy, red, watery, or puffy eyes.  Skin rash or hives.  Stomach pain.  Vomiting.  Diarrhea.  Bloating.  Wheezing or coughing. People with a severe allergy to food, medicine, or an insect bite may have a life-threatening allergic reaction (anaphylaxis). Symptoms of anaphylaxis include:  Hives.  Itching.  Flushed face.  Swollen lips, tongue, or mouth.  Tight or swollen throat.  Chest pain or tightness in the chest.  Trouble breathing or shortness of breath.  Rapid heartbeat.  Dizziness or fainting.  Vomiting.  Diarrhea.  Pain in the abdomen. How is this diagnosed? This condition is diagnosed based on:  Your symptoms.  Your family and medical history.  A physical exam. You may need to see a health care provider who specializes in treating allergies (allergist). You may also have tests, including:  Skin tests to see which allergens are causing your symptoms, such as: ? Skin prick test. In this test, your skin is pricked with a tiny needle and exposed to small amounts of possible allergens to see if your skin reacts. ? Intradermal skin test. In this test, a small amount of allergen is injected under your skin to see if your skin reacts. ? Patch test. In this test,  a small amount of allergen is placed on your skin and then your skin is covered with a bandage. Your health care provider will check your skin after a couple of days to see if a rash has developed.  Blood tests.  Challenges tests. In this test, you inhale a small amount of allergen by mouth to see if you have an allergic reaction. You may also be asked to:  Keep a food diary. A food diary is a record of all the foods and drinks you have in a day and any symptoms you experience.  Practice an elimination diet. An elimination diet involves eliminating specific foods from your diet and then adding them back in one by one to find out if a certain food causes an allergic reaction. How is this treated? Treatment for allergies depends on your symptoms. Treatment may include:  Cold compresses to  soothe itching and swelling.  Eye drops.  Nasal sprays.  Using a saline spray or container (neti pot) to flush out the nose (nasal irrigation). These methods can help clear away mucus and keep the nasal passages moist.  Using a humidifier.  Oral antihistamines or other medicines to block allergic reaction and inflammation.  Skin creams to treat rashes or itching.  Diet changes to eliminate food allergy triggers.  Repeated exposure to tiny amounts of allergens to build up a tolerance and prevent future allergic reactions (immunotherapy). These include: ? Allergy shots. ? Oral treatment. This involves taking small doses of an allergen under the tongue (sublingual immunotherapy).  Emergency epinephrine injection (auto-injector) in case of an allergic emergency. This is a self-injectable, pre-measured medicine that must be given within the first few minutes of a serious allergic reaction. Follow these instructions at home:         Avoid known allergens whenever possible.  If you suffer from airborne allergens, wash out your nose daily. You can do this with a saline spray or a neti pot to flush  out your nose (nasal irrigation).  Take over-the-counter and prescription medicines only as told by your health care provider.  Keep all follow-up visits as told by your health care provider. This is important.  If you are at risk of a severe allergic reaction (anaphylaxis), keep your auto-injector with you at all times.  If you have ever had anaphylaxis, wear a medical alert bracelet or necklace that states you have a severe allergy. Contact a health care provider if:  Your symptoms do not improve with treatment. Get help right away if:  You have symptoms of anaphylaxis, such as: ? Swollen mouth, tongue, or throat. ? Pain or tightness in your chest. ? Trouble breathing or shortness of breath. ? Dizziness or fainting. ? Severe abdominal pain, vomiting, or diarrhea. This information is not intended to replace advice given to you by your health care provider. Make sure you discuss any questions you have with your health care provider. Document Revised: 03/08/2018 Document Reviewed: 06/30/2016 Elsevier Patient Education  [place].

## 2020-01-09 LAB — URINE CULTURE

## 2020-02-04 ENCOUNTER — Other Ambulatory Visit: Payer: Self-pay

## 2020-02-04 ENCOUNTER — Ambulatory Visit (INDEPENDENT_AMBULATORY_CARE_PROVIDER_SITE_OTHER): Payer: Medicare Other | Admitting: Nurse Practitioner

## 2020-02-04 ENCOUNTER — Encounter: Payer: Self-pay | Admitting: Nurse Practitioner

## 2020-02-04 VITALS — BP 105/63 | HR 79 | Temp 98.6°F | Resp 16 | Ht 67.0 in | Wt 227.0 lb

## 2020-02-04 DIAGNOSIS — M545 Low back pain, unspecified: Secondary | ICD-10-CM

## 2020-02-04 DIAGNOSIS — R109 Unspecified abdominal pain: Secondary | ICD-10-CM

## 2020-02-04 DIAGNOSIS — N921 Excessive and frequent menstruation with irregular cycle: Secondary | ICD-10-CM

## 2020-02-04 DIAGNOSIS — R10A0 Flank pain, unspecified side: Secondary | ICD-10-CM

## 2020-02-04 DIAGNOSIS — D259 Leiomyoma of uterus, unspecified: Secondary | ICD-10-CM

## 2020-02-04 DIAGNOSIS — G8929 Other chronic pain: Secondary | ICD-10-CM

## 2020-02-04 MED ORDER — TIZANIDINE HCL 4 MG PO TABS: 4.0000 mg | ORAL_TABLET | Freq: Four times a day (QID) | ORAL | 0 refills | Status: DC | PRN

## 2020-02-04 NOTE — Progress Notes (Signed)
Established Patient Office Visit  Subjective:  Patient ID: Rhonda Hester, female    DOB: 01-07-1976  Age: 44 y.o. MRN: [id number]  CC:  Chief Complaint  Patient presents with  . Follow-up    4 week follow up for left sided back pain     HPI  Rhonda Hester presents for follow-up.  She was in on 01/07/2020 for right-sided flank/back pain.  She was treated in the ER prior however her symptoms have not resolved.  She continues to have right flank pain.  Her x-ray was normal.  She has been using ibuprofen and tizanidine with no relief.  The pain has been persistent.  She noticed the pain more with lying down.  Turning in the bed has become a chore. She admits that she is currently under the care of pain management.  She was seeking a new referral.  She admits that she has not heard in the wound this.  She has a history of uterine fibroids.  She has undergone [name] injections in the past.  She also had an abnormal Pap and underwent a colposcopy.  She is continuing to have the pelvic pain that is worse with her cycles.  She admits that the pain causes her to be homebound for 2 to 3 days.  She is not complaining of heavy bleeding.  She would like to follow-up on this.  She has received care at [facility].  The GYN office in [place] is unknown.  She is concerned because she has not had any additional treatments.   Past Medical History:  Diagnosis Date  . Allergic asthma   . Allergy    SEASONAL  . Anxiety   . Breast mass 05/2017   lt breast lump  . Eczema   . GERD (gastroesophageal reflux disease)   . History of anal fissures   . History of Bell's palsy    2006  RIGHT SIDE--  RESOLVED  . History of gastroesophageal reflux (GERD)   . History of kidney stones   . Hyperlipidemia   . Interstitial cystitis   . Sarcoidosis of lung ([place])    DX 2006--  PULMOLOGIST--  DR [id number] on 2 liters of oxygen, goes to pain clinic  . Seasonal allergic rhinitis   . Shortness of breath      Past Surgical History:  Procedure Laterality Date  . CYSTO WITH HYDRODISTENSION N/A 08/24/2013   Procedure: CYSTOSCOPY/HYDRODISTENSION with instillation of marcaine and pyridium;  Surgeon: Fredricka Bonine, MD;  Location: [facility];  Service: Urology;  Laterality: N/A;  . CYSTOSCOPY/ HYDRODISTENTION/ BLADDER BX  06-09-2010  . CYSTOSCOPY/URETEROSCOPY/HOLMIUM LASER/STENT PLACEMENT Right 01/30/2019   Procedure: CYSTOSCOPY/RETROGRADE/URETEROSCOPY/HOLMIUM LASER/STENT PLACEMENT;  Surgeon: Festus Aloe, MD;  Location: [facility];  Service: Urology;  Laterality: Right;  . DILATION AND CURETTAGE OF UTERUS  1997  . EXTRACORPOREAL SHOCK WAVE LITHOTRIPSY Right 11/30/2018   Procedure: EXTRACORPOREAL SHOCK WAVE LITHOTRIPSY (ESWL);  Surgeon: Bjorn Loser, MD;  Location: [facility];  Service: Urology;  Laterality: Right;  . IR TRANSCATHETER BX  06/14/2018  . IR US GUIDE VASC ACCESS RIGHT  06/14/2018  . IR VENOGRAM HEPATIC W HEMODYNAMIC EVALUATION  06/14/2018    Family History  Problem Relation Age of Onset  . Brain cancer Maternal Grandmother   . Hyperlipidemia Other   . Sleep apnea Other   . Depression Maternal Aunt   . Seizures Maternal Uncle   . Colon cancer Neg Hx   . Stomach cancer Neg Hx  Social History   Socioeconomic History  . Marital status: Single    Spouse name: Not on file  . Number of children: 0  . Years of education: Not on file  . Highest education level: Not on file  Occupational History  . Occupation: disabled  Tobacco Use  . Smoking status: Former Smoker    Packs/day: 0.30    Years: 6.00    Pack years: 1.80    Types: Cigarettes, Cigars    Quit date: 11/2016    Years since quitting: 3.1  . Smokeless tobacco: Never Used  . Tobacco comment: ONE CIG. DAILY /  1 PPMONTH  Substance and Sexual Activity  . Alcohol use: No    Alcohol/week: 0.0 standard drinks  . Drug use: No    Comment: HX MARIJUANA USE  . Sexual activity: Never    Birth  control/protection: None    Comment: abstinence   Other Topics Concern  . Not on file  Social History Narrative  . Not on file   Social Determinants of Health   Financial Resource Strain:   . Difficulty of Paying Living Expenses: Not on file  Food Insecurity:   . Worried About Charity fundraiser in the Last Year: Not on file  . Ran Out of Food in the Last Year: Not on file  Transportation Needs:   . Lack of Transportation (Medical): Not on file  . Lack of Transportation (Non-Medical): Not on file  Physical Activity:   . Days of Exercise per Week: Not on file  . Minutes of Exercise per Session: Not on file  Stress:   . Feeling of Stress : Not on file  Social Connections:   . Frequency of Communication with Friends and Family: Not on file  . Frequency of Social Gatherings with Friends and Family: Not on file  . Attends Religious Services: Not on file  . Active Member of Clubs or Organizations: Not on file  . Attends Archivist Meetings: Not on file  . Marital Status: Not on file  Intimate Partner Violence:   . Fear of Current or Ex-Partner: Not on file  . Emotionally Abused: Not on file  . Physically Abused: Not on file  . Sexually Abused: Not on file    Outpatient Medications Prior to Visit  Medication Sig Dispense Refill  . albuterol (PROAIR HFA) 108 (90 Base) MCG/ACT inhaler Inhale 2 puffs into the lungs every 6 (six) hours as needed for shortness of breath. 1 Inhaler 5  . Cetirizine HCl 10 MG CAPS Take 1 capsule (10 mg total) by mouth daily. 20 capsule 0  . folic acid (FOLVITE) 1 MG tablet Take 1 mg by mouth every Wednesday.     Marland Kitchen ibuprofen (ADVIL) 800 MG tablet Take 1 tablet (800 mg total) by mouth 3 (three) times daily. With food 21 tablet 0  . meloxicam (MOBIC) 15 MG tablet Take 15 mg by mouth daily as needed for pain.   0  . methotrexate (RHEUMATREX) 2.5 MG tablet Take 2.5 mg by mouth every Wednesday. Caution: Chemotherapy. Protect from light.    .  Oxycodone HCl 10 MG TABS Take 10 mg by mouth every 4 (four) hours as needed (pain).    . triamcinolone cream (KENALOG) 0.1 % Apply 1 application topically 2 (two) times daily. 456.6 g 0  . tiZANidine (ZANAFLEX) 4 MG tablet Take 1 tablet (4 mg total) by mouth every 6 (six) hours as needed for muscle spasms. 30 tablet 0  .  cyclobenzaprine (FLEXERIL) 10 MG tablet Take 1 tablet (10 mg total) by mouth 2 (two) times daily as needed for muscle spasms. (Patient not taking: Reported on 01/07/2020) 20 tablet 0   Facility-Administered Medications Prior to Visit  Medication Dose Route Frequency Provider Last Rate Last Admin  . bupivacaine (MARCAINE) 0.5 % 15 mL, phenazopyridine (PYRIDIUM) 400 mg bladder mixture   Bladder Instillation Once Festus Aloe, MD      . leuprolide ([name]) injection 11.25 mg  11.25 mg Intramuscular Q90 days Hulan Fray, Myra C, MD   11.25 mg at 03/01/18 0945  . leuprolide ([name]) injection 3.75 mg  3.75 mg Intramuscular Once Denney, Rachelle A, CNM        Allergies  Allergen Reactions  . Acyclovir And Related Other (See Comments)    Patient states it "made me feel like I can't breath"   . Citric Acid Other (See Comments)    AVOIDS DUE TO INTERSTITIAL CYSTITIS  . Dust Mite Extract Itching and Other (See Comments)     coughing  . Grapeseed Extract [Nutritional Supplements] Other (See Comments)    Throat itching and burning  . Pineapple Itching    Burning in mouth and tingling lips  . Pollen Extract Itching and Other (See Comments)     coughing  . Nickel Rash    ROS Review of Systems  Constitutional: Negative.   HENT: Negative.   Eyes: Negative.   Respiratory: Negative.   Cardiovascular: Negative.   Gastrointestinal: Negative.   Endocrine: Negative.   Genitourinary: Positive for flank pain and pelvic pain.  Musculoskeletal: Positive for back pain.  Allergic/Immunologic: Negative.   Neurological: Negative.   Hematological: Negative.   Psychiatric/Behavioral:  Negative.       Objective:    Physical Exam  Constitutional: She is oriented to person, place, and time. She appears well-developed.  Obese  HENT:  Head: Normocephalic.  Abdominal:  Flank pain   Musculoskeletal:     Cervical back: Normal range of motion.  Neurological: She is alert and oriented to person, place, and time.  Skin: Skin is warm and dry.       BP 105/63 (BP Location: Left Arm, Patient Position: Sitting, Cuff Size: Large)   Pulse 79   Temp 98.6 F (37 C) (Oral)   Resp 16   Ht 5\' 7"  (1.702 m)   Wt 227 lb (103 kg)   LMP 01/20/2020   SpO2 99%   BMI 35.55 kg/m  Wt Readings from Last 3 Encounters:  02/04/20 227 lb (103 kg)  01/07/20 227 lb (103 kg)  08/29/19 214 lb (97.1 kg)     Health Maintenance Due  Topic Date Due  . PAP SMEAR-Modifier  06/15/2019    There are no preventive care reminders to display for this patient.  Lab Results  Component Value Date   TSH 1.14 05/10/2016   Lab Results  Component Value Date   WBC 5.3 01/30/2019   HGB 11.2 (L) 01/30/2019   HCT 33.0 (L) 01/30/2019   MCV 90.4 01/30/2019   PLT 267 01/30/2019   Lab Results  Component Value Date   NA 142 01/30/2019   K 3.5 01/30/2019   CO2 25 11/11/2018   GLUCOSE 91 01/30/2019   BUN 7 11/11/2018   CREATININE 0.67 11/11/2018   BILITOT 1.2 11/11/2018   ALKPHOS 195 (H) 11/11/2018   AST 40 11/11/2018   ALT 45 (H) 11/11/2018   PROT 8.2 (H) 11/11/2018   ALBUMIN 3.4 (L) 11/11/2018   CALCIUM 8.9 11/11/2018  ANIONGAP 9 11/11/2018   GFR 138.73 04/25/2018   Lab Results  Component Value Date   CHOL 152 08/09/2017   Lab Results  Component Value Date   HDL 38 (L) 08/09/2017   Lab Results  Component Value Date   LDLCALC 96 08/09/2017   Lab Results  Component Value Date   TRIG 90 08/09/2017   Lab Results  Component Value Date   CHOLHDL 4.0 08/09/2017   Lab Results  Component Value Date   HGBA1C 4.9 05/10/2018      Assessment & Plan:    Problem List Items  Addressed This Visit      Unprioritized   Chronic bilateral low back pain without sciatica   Relevant Medications   tiZANidine (ZANAFLEX) 4 MG tablet   Menorrhagia   Uterine fibroid    Other Visit Diagnoses    Flank pain    -  Primary   Renal US Encouraged cranberry juice and water only Refill on muscle relaxer With chart review she is status post lithotripsy right renal calculus.    Relevant Orders   US Renal      Meds ordered this encounter  Medications  . tiZANidine (ZANAFLEX) 4 MG tablet    Sig: Take 1 tablet (4 mg total) by mouth every 6 (six) hours as needed for muscle spasms.    Dispense:  30 tablet    Refill:  0    Order Specific Question:   Supervising Provider    Answer:   Tresa Garter [id number]    Follow-up: Return in about 3 months (around 05/03/2020).    Vevelyn Francois, NP

## 2020-02-05 ENCOUNTER — Other Ambulatory Visit: Payer: Self-pay

## 2020-02-05 MED ORDER — TIZANIDINE HCL 4 MG PO TABS: 4.0000 mg | ORAL_TABLET | Freq: Four times a day (QID) | ORAL | 0 refills | Status: DC | PRN

## 2020-02-08 ENCOUNTER — Ambulatory Visit (HOSPITAL_COMMUNITY)
Admission: RE | Admit: 2020-02-08 | Discharge: 2020-02-08 | Disposition: A | Discharge status: Home / Self Care | Payer: Medicare Other | Source: Ambulatory Visit | Attending: Nurse Practitioner | Admitting: Nurse Practitioner

## 2020-02-08 ENCOUNTER — Other Ambulatory Visit: Payer: Self-pay

## 2020-02-08 DIAGNOSIS — R109 Unspecified abdominal pain: Secondary | ICD-10-CM | POA: Diagnosis present

## 2020-02-19 ENCOUNTER — Telehealth: Payer: Self-pay | Admitting: Nurse Practitioner

## 2020-02-19 NOTE — Telephone Encounter (Signed)
Called and spoke with patient regarding ultrasound results not showing any cause for the pain she is experiencing. She is asking what could be causing this or what the next step should be, she is still experiencing the pain daily. She is also asking about a referral for gyn for menstrual cramping, Can this be ordered for her?

## 2020-02-20 ENCOUNTER — Other Ambulatory Visit: Payer: Self-pay | Admitting: Nurse Practitioner

## 2020-02-20 DIAGNOSIS — D259 Leiomyoma of uterus, unspecified: Secondary | ICD-10-CM

## 2020-02-20 DIAGNOSIS — N921 Excessive and frequent menstruation with irregular cycle: Secondary | ICD-10-CM

## 2020-02-20 NOTE — Telephone Encounter (Signed)
Referral is in.

## 2020-02-28 ENCOUNTER — Ambulatory Visit: Payer: Medicare Other | Attending: Internal Medicine

## 2020-02-28 DIAGNOSIS — Z20822 Contact with and (suspected) exposure to covid-19: Secondary | ICD-10-CM

## 2020-02-29 LAB — NOVEL CORONAVIRUS, NAA: SARS-CoV-2, NAA: NOT DETECTED

## 2020-03-13 DIAGNOSIS — D860 Sarcoidosis of lung: Secondary | ICD-10-CM | POA: Diagnosis not present

## 2020-03-13 DIAGNOSIS — Z03818 Encounter for observation for suspected exposure to other biological agents ruled out: Secondary | ICD-10-CM | POA: Diagnosis not present

## 2020-03-18 DIAGNOSIS — M25551 Pain in right hip: Secondary | ICD-10-CM | POA: Diagnosis not present

## 2020-03-18 DIAGNOSIS — Z79891 Long term (current) use of opiate analgesic: Secondary | ICD-10-CM | POA: Diagnosis not present

## 2020-03-18 DIAGNOSIS — M5431 Sciatica, right side: Secondary | ICD-10-CM | POA: Diagnosis not present

## 2020-03-18 DIAGNOSIS — M545 Low back pain: Secondary | ICD-10-CM | POA: Diagnosis not present

## 2020-03-18 DIAGNOSIS — G8929 Other chronic pain: Secondary | ICD-10-CM | POA: Diagnosis not present

## 2020-03-18 DIAGNOSIS — M79671 Pain in right foot: Secondary | ICD-10-CM | POA: Diagnosis not present

## 2020-03-18 DIAGNOSIS — G894 Chronic pain syndrome: Secondary | ICD-10-CM | POA: Diagnosis not present

## 2020-03-19 DIAGNOSIS — N946 Dysmenorrhea, unspecified: Secondary | ICD-10-CM | POA: Diagnosis not present

## 2020-03-19 DIAGNOSIS — R030 Elevated blood-pressure reading, without diagnosis of hypertension: Secondary | ICD-10-CM | POA: Diagnosis not present

## 2020-03-19 DIAGNOSIS — D860 Sarcoidosis of lung: Secondary | ICD-10-CM | POA: Diagnosis not present

## 2020-03-19 DIAGNOSIS — G894 Chronic pain syndrome: Secondary | ICD-10-CM | POA: Diagnosis not present

## 2020-03-19 DIAGNOSIS — K5900 Constipation, unspecified: Secondary | ICD-10-CM | POA: Diagnosis not present

## 2020-03-19 DIAGNOSIS — R1032 Left lower quadrant pain: Secondary | ICD-10-CM | POA: Diagnosis not present

## 2020-03-19 DIAGNOSIS — Z720 Tobacco use: Secondary | ICD-10-CM | POA: Diagnosis not present

## 2020-03-23 DIAGNOSIS — N870 Mild cervical dysplasia: Secondary | ICD-10-CM | POA: Insufficient documentation

## 2020-03-23 DIAGNOSIS — B977 Papillomavirus as the cause of diseases classified elsewhere: Secondary | ICD-10-CM | POA: Insufficient documentation

## 2020-04-02 DIAGNOSIS — K921 Melena: Secondary | ICD-10-CM | POA: Diagnosis not present

## 2020-04-02 DIAGNOSIS — D869 Sarcoidosis, unspecified: Secondary | ICD-10-CM | POA: Diagnosis not present

## 2020-04-11 ENCOUNTER — Emergency Department (HOSPITAL_COMMUNITY)
Admission: EM | Admit: 2020-04-11 | Discharge: 2020-04-11 | Disposition: A | Discharge status: Home / Self Care | Payer: Medicare Other | Attending: Emergency Medicine | Admitting: Emergency Medicine

## 2020-04-11 ENCOUNTER — Emergency Department (HOSPITAL_COMMUNITY): Payer: Medicare Other

## 2020-04-11 DIAGNOSIS — Z79899 Other long term (current) drug therapy: Secondary | ICD-10-CM | POA: Insufficient documentation

## 2020-04-11 DIAGNOSIS — M79642 Pain in left hand: Secondary | ICD-10-CM | POA: Diagnosis not present

## 2020-04-11 DIAGNOSIS — M25532 Pain in left wrist: Secondary | ICD-10-CM | POA: Diagnosis not present

## 2020-04-11 DIAGNOSIS — S161XXA Strain of muscle, fascia and tendon at neck level, initial encounter: Secondary | ICD-10-CM | POA: Diagnosis not present

## 2020-04-11 DIAGNOSIS — Z743 Need for continuous supervision: Secondary | ICD-10-CM | POA: Diagnosis not present

## 2020-04-11 DIAGNOSIS — Y999 Unspecified external cause status: Secondary | ICD-10-CM | POA: Insufficient documentation

## 2020-04-11 DIAGNOSIS — Y92410 Unspecified street and highway as the place of occurrence of the external cause: Secondary | ICD-10-CM | POA: Insufficient documentation

## 2020-04-11 DIAGNOSIS — R079 Chest pain, unspecified: Secondary | ICD-10-CM | POA: Diagnosis not present

## 2020-04-11 DIAGNOSIS — Z87891 Personal history of nicotine dependence: Secondary | ICD-10-CM | POA: Insufficient documentation

## 2020-04-11 DIAGNOSIS — S199XXA Unspecified injury of neck, initial encounter: Secondary | ICD-10-CM | POA: Diagnosis not present

## 2020-04-11 DIAGNOSIS — R0789 Other chest pain: Secondary | ICD-10-CM | POA: Diagnosis not present

## 2020-04-11 DIAGNOSIS — Y939 Activity, unspecified: Secondary | ICD-10-CM | POA: Insufficient documentation

## 2020-04-11 DIAGNOSIS — S299XXA Unspecified injury of thorax, initial encounter: Secondary | ICD-10-CM | POA: Diagnosis not present

## 2020-04-11 DIAGNOSIS — S6992XA Unspecified injury of left wrist, hand and finger(s), initial encounter: Secondary | ICD-10-CM | POA: Diagnosis not present

## 2020-04-11 DIAGNOSIS — S63502A Unspecified sprain of left wrist, initial encounter: Secondary | ICD-10-CM | POA: Diagnosis not present

## 2020-04-11 DIAGNOSIS — I10 Essential (primary) hypertension: Secondary | ICD-10-CM | POA: Diagnosis not present

## 2020-04-11 MED ORDER — OXYCODONE-ACETAMINOPHEN 5-325 MG PO TABS
1.0000 | ORAL_TABLET | Freq: Once | ORAL | Status: AC
  Administered 2020-04-11: 1 via ORAL
  Filled 2020-04-11: qty 1

## 2020-04-11 MED ORDER — METHOCARBAMOL 500 MG PO TABS: 1000.0000 mg | ORAL_TABLET | Freq: Four times a day (QID) | ORAL | 0 refills | Status: DC

## 2020-04-11 NOTE — Progress Notes (Signed)
Orthopedic Tech Progress Note Patient Details:  Rhonda Hester 1976-05-04 [id number]  Ortho Devices Type of Ortho Device: Thumb velcro splint Ortho Device/Splint Location: LUE Ortho Device/Splint Interventions: Application, Ordered   Post Interventions Patient Tolerated: Well Instructions Provided: Care of [place] 04/11/2020, 4:07 PM

## 2020-04-11 NOTE — ED Notes (Addendum)
C-collar removed with permission from [place]. Pt ambulated to bathroom in hallway and back without assistance. Steady gait demonstrated.
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
## 2020-04-11 NOTE — ED Notes (Signed)
Pt transported to xray 

## 2020-04-13 DIAGNOSIS — D860 Sarcoidosis of lung: Secondary | ICD-10-CM | POA: Diagnosis not present

## 2020-04-15 DIAGNOSIS — G894 Chronic pain syndrome: Secondary | ICD-10-CM | POA: Diagnosis not present

## 2020-04-15 DIAGNOSIS — M79671 Pain in right foot: Secondary | ICD-10-CM | POA: Diagnosis not present

## 2020-04-15 DIAGNOSIS — M25551 Pain in right hip: Secondary | ICD-10-CM | POA: Diagnosis not present

## 2020-04-15 DIAGNOSIS — Z79891 Long term (current) use of opiate analgesic: Secondary | ICD-10-CM | POA: Diagnosis not present

## 2020-04-15 DIAGNOSIS — M5431 Sciatica, right side: Secondary | ICD-10-CM | POA: Diagnosis not present

## 2020-04-15 DIAGNOSIS — M25562 Pain in left knee: Secondary | ICD-10-CM | POA: Diagnosis not present

## 2020-04-15 DIAGNOSIS — G8929 Other chronic pain: Secondary | ICD-10-CM | POA: Diagnosis not present

## 2020-04-15 DIAGNOSIS — M545 Low back pain: Secondary | ICD-10-CM | POA: Diagnosis not present

## 2020-04-16 ENCOUNTER — Encounter: Payer: Self-pay | Admitting: Nurse Practitioner

## 2020-04-16 ENCOUNTER — Ambulatory Visit (INDEPENDENT_AMBULATORY_CARE_PROVIDER_SITE_OTHER): Payer: Medicare Other | Admitting: Nurse Practitioner

## 2020-04-16 ENCOUNTER — Other Ambulatory Visit: Payer: Self-pay

## 2020-04-16 ENCOUNTER — Ambulatory Visit (HOSPITAL_COMMUNITY)
Admission: RE | Admit: 2020-04-16 | Discharge: 2020-04-16 | Disposition: A | Discharge status: Home / Self Care | Payer: Medicare Other | Source: Ambulatory Visit | Attending: Nurse Practitioner | Admitting: Nurse Practitioner

## 2020-04-16 VITALS — BP 121/80 | HR 81 | Temp 99.3°F | Resp 16 | Ht 67.0 in | Wt 224.0 lb

## 2020-04-16 DIAGNOSIS — M545 Low back pain, unspecified: Secondary | ICD-10-CM
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
## 2020-04-16 NOTE — Patient Instructions (Signed)
Acute Back Pain, Adult Acute back pain is sudden and usually short-lived. It is often caused by an injury to the muscles and tissues in the back. The injury may result from:  A muscle or ligament getting overstretched or torn (strained). Ligaments are tissues that connect bones to each other. Lifting something improperly can cause a back strain.  Wear and tear (degeneration) of the spinal disks. Spinal disks are circular tissue that provides cushioning between the bones of the spine (vertebrae).  Twisting motions, such as while playing sports or doing yard work.  A hit to the back.  Arthritis. You may have a physical exam, lab tests, and imaging tests to find the cause of your pain. Acute back pain usually goes away with rest and home care. Follow these instructions at home: Managing pain, stiffness, and swelling  Take over-the-counter and prescription medicines only as told by your health care provider.  Your health care provider may recommend applying ice during the first 24-48 hours after your pain starts. To do this: ? Put ice in a plastic bag. ? Place a towel between your skin and the bag. ? Leave the ice on for 20 minutes, 2-3 times a day.  If directed, apply heat to the affected area as often as told by your health care provider. Use the heat source that your health care provider recommends, such as a moist heat pack or a heating pad. ? Place a towel between your skin and the heat source. ? Leave the heat on for 20-30 minutes. ? Remove the heat if your skin turns bright red. This is especially important if you are unable to feel pain, heat, or cold. You have a greater risk of getting burned. Activity   Do not stay in bed. Staying in bed for more than 1-2 days can delay your recovery.  Sit up and stand up straight. Avoid leaning forward when you sit, or hunching over when you stand. ? If you work at a desk, sit close to it so you do not need to lean over. Keep your chin tucked  in. Keep your neck drawn back, and keep your elbows bent at a right angle. Your arms should look like the letter "L." ? Sit high and close to the steering wheel when you drive. Add lower back (lumbar) support to your car seat, if needed.  Take short walks on even surfaces as soon as you are able. Try to increase the length of time you walk each day.  Do not sit, drive, or stand in one place for more than 30 minutes at a time. Sitting or standing for long periods of time can put stress on your back.  Do not drive or use heavy machinery while taking prescription pain medicine.  Use proper lifting techniques. When you bend and lift, use positions that put less stress on your back: ? Bend your knees. ? Keep the load close to your body. ? Avoid twisting.  Exercise regularly as told by your health care provider. Exercising helps your back heal faster and helps prevent back injuries by keeping muscles strong and flexible.  Work with a physical therapist to make a safe exercise program, as recommended by your health care provider. Do any exercises as told by your physical therapist. Lifestyle  Maintain a healthy weight. Extra weight puts stress on your back and makes it difficult to have good posture.  Avoid activities or situations that make you feel anxious or stressed. Stress and anxiety increase muscle   tension and can make back pain worse. Learn ways to manage anxiety and stress, such as through exercise. General instructions  Sleep on a firm mattress in a comfortable position. Try lying on your side with your knees slightly bent. If you lie on your back, put a pillow under your knees.  Follow your treatment plan as told by your health care provider. This may include: ? Cognitive or behavioral therapy. ? Acupuncture or massage therapy. ? Meditation or yoga. Contact a health care provider if:  You have pain that is not relieved with rest or medicine.  You have increasing pain going down  into your legs or buttocks.  Your pain does not improve after 2 weeks.  You have pain at night.  You lose weight without trying.  You have a fever or chills. Get help right away if:  You develop new bowel or bladder control problems.  You have unusual weakness or numbness in your arms or legs.  You develop nausea or vomiting.  You develop abdominal pain.  You feel faint. Summary  Acute back pain is sudden and usually short-lived.  Use proper lifting techniques. When you bend and lift, use positions that put less stress on your back.  Take over-the-counter and prescription medicines and apply heat or ice as directed by your health care provider. This information is not intended to replace advice given to you by your health care provider. Make sure you discuss any questions you have with your health care provider. Document Revised: 04/03/2019 Document Reviewed: 07/27/2017 Elsevier Patient Education  2020 Elsevier Inc.  

## 2020-04-18 ENCOUNTER — Encounter: Payer: Self-pay | Admitting: Physician Assistant

## 2020-04-18 ENCOUNTER — Ambulatory Visit (INDEPENDENT_AMBULATORY_CARE_PROVIDER_SITE_OTHER): Payer: Medicare Other | Admitting: Physician Assistant

## 2020-04-18 VITALS — BP 120/70 | HR 83 | Temp 98.4°F | Ht 67.0 in | Wt 226.0 lb

## 2020-04-18 DIAGNOSIS — R7989 Other specified abnormal findings of blood chemistry: Secondary | ICD-10-CM | POA: Diagnosis not present
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
## 2020-04-18 NOTE — Patient Instructions (Signed)
If you are age 44 or older, your body mass index should be between 23-30. Your Body mass index is 35.4 kg/m. If this is out of the aforementioned range listed, please consider follow up with your Primary Care Provider.  If you are age 16 or younger, your body mass index should be between 19-25. Your Body mass index is 35.4 kg/m. If this is out of the aformentioned range listed, please consider follow up with your Primary Care Provider.   Follow up as needed with Dr. Silverio Decamp.

## 2020-04-23 ENCOUNTER — Other Ambulatory Visit: Payer: Self-pay

## 2020-04-23 ENCOUNTER — Encounter: Payer: Self-pay | Admitting: Obstetrics and Gynecology

## 2020-04-23 ENCOUNTER — Ambulatory Visit (INDEPENDENT_AMBULATORY_CARE_PROVIDER_SITE_OTHER): Payer: Medicare Other | Admitting: Obstetrics and Gynecology

## 2020-04-23 VITALS — BP 121/82 | HR 92 | Temp 98.3°F | Ht 67.0 in | Wt 231.0 lb

## 2020-04-23 DIAGNOSIS — D259 Leiomyoma of uterus, unspecified: Secondary | ICD-10-CM | POA: Diagnosis not present

## 2020-04-23 DIAGNOSIS — N920 Excessive and frequent menstruation with regular cycle: Secondary | ICD-10-CM

## 2020-04-23 DIAGNOSIS — N946 Dysmenorrhea, unspecified: Secondary | ICD-10-CM | POA: Diagnosis not present

## 2020-04-23 DIAGNOSIS — R102 Pelvic and perineal pain: Secondary | ICD-10-CM | POA: Diagnosis not present

## 2020-04-23 LAB — POCT URINALYSIS DIPSTICK
Bilirubin, UA: POSITIVE
Blood, UA: NEGATIVE
Glucose, UA: NEGATIVE
Leukocytes, UA: NEGATIVE
Nitrite, UA: NEGATIVE
Protein, UA: POSITIVE — AB
Spec Grav, UA: 1.025 (ref 1.010–1.025)
Urobilinogen, UA: 0.2 E.U./dL
pH, UA: 5 (ref 5.0–8.0)

## 2020-04-23 MED ORDER — MEDROXYPROGESTERONE ACETATE 150 MG/ML IM SUSP: 150.0000 mg | INTRAMUSCULAR | 3 refills | Status: DC

## 2020-04-23 NOTE — Progress Notes (Signed)
Patient ID: Rhonda Hester, female   DOB: 07/13/1976, 44 y.o.   MRN: [id number] Ms Minaya presents in referral Corky Downs NP for chronic pelvic pain, menorrhagia and uterine fibroids. Pt has had an extensive work up with [facility]. Last visit 03/19/20. She received Depo Provera at that time. Pt reports she can no longer travel to [place]. See notes in chart for additional information.  She also has a H/O CIN 1 per colpo Bx 11/20.  Medical problems as noted in chart, managed per PCP.  Pt reports that her cycle this month was not as painful but lasted a few days longer.   PE AF VSS Lungs clear Heart RRR Abd soft + BS non tender GU nl EGBUS no vaginal discharge cervix no lesions uterus small mobile non tender no adnexal masses  A/P Chronic pelvic pain        Menorrhagia        Uterine fibroids        Dysmenorrhea  Reviewed plan to continue with Depo Provera as started at [facility]. It would appear that it has helped some with her first cycle. Pt informed that the uterine fibroid is not the cause of her chronic pain. She is currently followed by pain clinic and encourage to continue with this. Rx of Depo Provera sent to Pharmacy. Will f/u in June for her next Depo Provera injection and office visit in 4 weeks.

## 2020-04-23 NOTE — Progress Notes (Signed)
New GYN presents with pelvic and back pain 5/10 x 1+ year, heavy bleeding, clots, changing overnight pads every hour.  Last PAP 09/07/2019 High risk HPV 11/15/2019 Endocervix Curettage  CIN-1  PHQ-9=8

## 2020-04-23 NOTE — Patient Instructions (Signed)
Uterine Fibroids  Uterine fibroids are lumps of tissue (tumors) in your womb (uterus). They are not cancer (are benign). Most women with this condition do not need treatment. Sometimes fibroids can affect your ability to have children (your fertility). If that happens, you may need surgery to take out the fibroids. Follow these instructions at home:  Take over-the-counter and prescription medicines only as told by your doctor. Your doctor may suggest NSAIDs (such as aspirin or ibuprofen) to help with pain.  Ask your doctor if you should: ? Take iron pills. ? Eat more foods that have iron in them, such as dark green, leafy vegetables.  If directed, apply heat to your back or belly to reduce pain. Use the heat source that your doctor recommends, such as a moist heat pack or a heating pad. ? Put a towel between your skin and the heat source. ? Leave the heat on for 20-30 minutes. ? Remove the heat if your skin turns bright red. This is especially important if you are unable to feel pain, heat, or cold. You may have a greater risk of getting burned.  Pay close attention to your period (menstrual) cycles. Tell your doctor about any changes, such as: ? A heavier blood flow than usual. ? Needing to use more pads or tampons than normal. ? A change in how many days your period lasts. ? A change in symptoms that come with your period, such as cramps or back pain.  Keep all follow-up visits as told by your doctor. This is important. Your doctor may need to watch your fibroids over time for any changes. Contact a doctor if you:  Have pain that does not get better with medicine or heat, such as pain or cramps in: ? Your back. ? The area between your hip bones (pelvic area). ? Your belly.  Have new bleeding between your periods.  Have more bleeding during or between your periods.  Feel very tired or weak.  Feel light-headed. Get help right away if you:  Pass out (faint).  Have pain in the  area between your hip bones that suddenly gets worse.  Have bleeding that soaks a tampon or pad in 30 minutes or less. Summary  Uterine fibroids are lumps of tissue (tumors) in your womb (uterus). They are not cancer.  The only treatment that most women need is taking aspirin or ibuprofen for pain.  Contact a doctor if you have pain or cramps that do not get better with medicine.  Make sure you know what symptoms you should get help for right away. This information is not intended to replace advice given to you by your health care provider. Make sure you discuss any questions you have with your health care provider. Document Revised: 11/25/2017 Document Reviewed: 11/08/2017 Elsevier Patient Education  2020 [employer]. Dysmenorrhea Dysmenorrhea means painful cramps during your period (menstrual period). You will have pain in your lower belly (abdomen). The pain is caused by the tightening (contracting) of the muscles of the womb (uterus). The pain may be mild or very bad. With this condition, you may:  Have a headache.  Feel sick to your stomach (nauseous).  Throw up (vomit).  Have lower back pain. Follow these instructions at home: Helping pain and cramping   Put heat on your lower back or belly when you have pain or cramps. Use the heat source that your doctor tells you to use. ? Place a towel between your skin and the heat. ? Leave  the heat on for 20-30 minutes. ? Remove the heat if your skin turns bright red. This is especially important if you cannot feel pain, heat, or cold. ? Do not have a heating pad on during sleep.  Do aerobic exercises. These include walking, swimming, or biking. These may help with cramps.  Massage your lower back or belly. This may help lessen pain. General instructions  Take over-the-counter and prescription medicines only as told by your doctor.  Do not drive or use heavy machinery while taking prescription pain medicine.  Avoid alcohol  and caffeine during and right before your period. These can make cramps worse.  Do not use any products that have nicotine or tobacco. These include cigarettes and e-cigarettes. If you need help quitting, ask your doctor.  Keep all follow-up visits as told by your doctor. This is important. Contact a doctor if:  You have pain that gets worse.  You have pain that does not get better with medicine.  You have pain during sex.  You feel sick to your stomach or you throw up during your period, and medicine does not help. Get help right away if:  You pass out (faint). Summary  Dysmenorrhea means painful cramps during your period (menstrual period).  Put heat on your lower back or belly when you have pain or cramps.  Do exercises like walking, swimming, or biking to help with cramps.  Contact a doctor if you have pain during sex. This information is not intended to replace advice given to you by your health care provider. Make sure you discuss any questions you have with your health care provider. Document Revised: 11/25/2017 Document Reviewed: 12/30/2016 Elsevier Patient Education  [place].

## 2020-04-24 ENCOUNTER — Encounter: Payer: Self-pay | Admitting: Family Medicine

## 2020-04-24 ENCOUNTER — Ambulatory Visit (INDEPENDENT_AMBULATORY_CARE_PROVIDER_SITE_OTHER): Payer: Medicare Other | Admitting: Family Medicine

## 2020-04-24 DIAGNOSIS — M545 Low back pain, unspecified: Secondary | ICD-10-CM

## 2020-04-24 DIAGNOSIS — M542 Cervicalgia: Secondary | ICD-10-CM | POA: Diagnosis not present

## 2020-04-24 MED ORDER — PREDNISONE 10 MG PO TABS: ORAL_TABLET | ORAL | 0 refills | Status: DC

## 2020-04-24 MED ORDER — BACLOFEN 10 MG PO TABS: 5.0000 mg | ORAL_TABLET | Freq: Three times a day (TID) | ORAL | 3 refills | Status: DC | PRN
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
## 2020-04-29 NOTE — Progress Notes (Signed)
Reviewed and agree with documentation and assessment and plan. K. Veena Ashley Bultema , MD   

## 2020-04-30 DIAGNOSIS — R21 Rash and other nonspecific skin eruption: Secondary | ICD-10-CM | POA: Diagnosis not present

## 2020-04-30 DIAGNOSIS — D860 Sarcoidosis of lung: Secondary | ICD-10-CM | POA: Diagnosis not present

## 2020-04-30 DIAGNOSIS — Z79899 Other long term (current) drug therapy: Secondary | ICD-10-CM | POA: Diagnosis not present

## 2020-05-05 ENCOUNTER — Other Ambulatory Visit: Payer: Self-pay

## 2020-05-05 ENCOUNTER — Encounter: Payer: Self-pay | Admitting: Nurse Practitioner

## 2020-05-05 ENCOUNTER — Ambulatory Visit (INDEPENDENT_AMBULATORY_CARE_PROVIDER_SITE_OTHER): Payer: Medicare Other | Admitting: Nurse Practitioner

## 2020-05-05 VITALS — BP 137/85 | HR 90 | Ht 67.0 in | Wt 228.2 lb

## 2020-05-05 DIAGNOSIS — E669 Obesity, unspecified: Secondary | ICD-10-CM

## 2020-05-05 DIAGNOSIS — E8881 Metabolic syndrome: Secondary | ICD-10-CM

## 2020-05-05 DIAGNOSIS — D860 Sarcoidosis of lung: Secondary | ICD-10-CM

## 2020-05-05 DIAGNOSIS — F331 Major depressive disorder, recurrent, moderate: Secondary | ICD-10-CM | POA: Insufficient documentation

## 2020-05-05 DIAGNOSIS — Z0000 Encounter for general adult medical examination without abnormal findings: Secondary | ICD-10-CM | POA: Diagnosis not present

## 2020-05-05 DIAGNOSIS — Z3042 Encounter for surveillance of injectable contraceptive: Secondary | ICD-10-CM | POA: Diagnosis not present

## 2020-05-05 LAB — POCT URINALYSIS DIPSTICK
Blood, UA: NEGATIVE
Glucose, UA: NEGATIVE
Ketones, UA: NEGATIVE
Leukocytes, UA: NEGATIVE
Nitrite, UA: NEGATIVE
Protein, UA: POSITIVE — AB
Spec Grav, UA: >=1.03 — AB (ref 1.010–1.025)
Urobilinogen, UA: 1 E.U./dL
pH, UA: 6 (ref 5.0–8.0)

## 2020-05-05 LAB — POCT URINE PREGNANCY: Preg Test, Ur: NEGATIVE

## 2020-05-05 NOTE — Patient Instructions (Addendum)
Sacroiliac Joint Dysfunction  Sacroiliac joint dysfunction is a condition that causes inflammation on one or both sides of the sacroiliac (SI) joint. The SI joint connects the lower part of the spine (sacrum) with the two upper portions of the pelvis (ilium). This condition causes deep aching or burning pain in the low back. In some cases, the pain may also spread into one or both buttocks, hips, or thighs. What are the causes? This condition may be caused by:  Pregnancy. During pregnancy, extra stress is put on the SI joints because the pelvis widens.  Injury, such as: ? Injuries from car accidents. ? Sports-related injuries. ? Work-related injuries.  Having one leg that is shorter than the other.  Conditions that affect the joints, such as: ? Rheumatoid arthritis. ? Gout. ? Psoriatic arthritis. ? Joint infection (septic arthritis). Sometimes, the cause of SI joint dysfunction is not known. What are the signs or symptoms? Symptoms of this condition include:  Aching or burning pain in the lower back. The pain may also spread to other areas, such as: ? Buttocks. ? Groin. ? Thighs.  Muscle spasms in or around the painful areas.  Increased pain when standing, walking, running, stair climbing, bending, or lifting. How is this diagnosed? This condition is diagnosed with a physical exam and medical history. During the exam, the health care provider may move one or both of your legs to different positions to check for pain. Various tests may be done to confirm the diagnosis, including:  Imaging tests to look for other causes of pain. These may include: ? MRI. ? CT scan. ? Bone scan.  Diagnostic injection. A numbing medicine is injected into the SI joint using a needle. If your pain is temporarily improved or stopped after the injection, this can indicate that SI joint dysfunction is the problem. How is this treated? Treatment depends on the cause and severity of your condition.  Treatment options may include:  Ice or heat applied to the lower back area after an injury. This may help reduce pain and muscle spasms.  Medicines to relieve pain or inflammation or to relax the muscles.  Wearing a back brace (sacroiliac brace) to help support the joint while your back is healing.  Physical therapy to increase muscle strength around the joint and flexibility at the joint. This may also involve learning proper body positions and ways of moving to relieve stress on the joint.  Direct manipulation of the SI joint.  Injections of steroid medicine into the joint to reduce pain and swelling.  Radiofrequency ablation to burn away nerves that are carrying pain messages from the joint.  Use of a device that provides electrical stimulation to help reduce pain at the joint.  Surgery to put in screws and plates that limit or prevent joint motion. This is rare. Follow these instructions at home: Medicines  Take over-the-counter and prescription medicines only as told by your health care provider.  Do not drive or use heavy machinery while taking prescription pain medicine.  If you are taking prescription pain medicine, take actions to prevent or treat constipation. Your health care provider may recommend that you: ? Drink enough fluid to keep your urine pale yellow. ? Eat foods that are high in fiber, such as fresh fruits and vegetables, whole grains, and beans. ? Limit foods that are high in fat and processed sugars, such as fried or sweet foods. ? Take an over-the-counter or prescription medicine for constipation. If you have a brace:    Wear the brace as told by your health care provider. Remove it only as told by your health care provider.  Keep the brace clean.  If the brace is not waterproof: ? Do not let it get wet. ? Cover it with a watertight covering when you take a bath or a shower. Managing pain, stiffness, and swelling      Icing can help with pain and  swelling. Heat may help with muscle tension or spasms. Ask your health care provider if you should use ice or heat.  If directed, put ice on the affected area: ? If you have a removable brace, remove it as told by your health care provider. ? Put ice in a plastic bag. ? Place a towel between your skin and the bag. ? Leave the ice on for 20 minutes, 2-3 times a day.  If directed, apply heat to the affected area. Use the heat source that your health care provider recommends, such as a moist heat pack or a heating pad. ? Place a towel between your skin and the heat source. ? Leave the heat on for 20-30 minutes. ? Remove the heat if your skin turns bright red. This is especially important if you are unable to feel pain, heat, or cold. You may have a greater risk of getting burned. General instructions  Rest as needed. Ask your health care provider what activities are safe for you.  Return to your normal activities as told by your health care provider.  Exercise as directed by your health care provider or physical therapist.  Do not use any products that contain nicotine or tobacco, such as cigarettes and e-cigarettes. These can delay bone healing. If you need help quitting, ask your health care provider.  Keep all follow-up visits as told by your health care provider. This is important. Contact a health care provider if:  Your pain is not controlled with medicine.  You have a fever.  Your pain is getting worse. Get help right away if:  You have weakness, numbness, or tingling in your legs or feet.  You lose control of your bladder or bowel. Summary  Sacroiliac joint dysfunction is a condition that causes inflammation on one or both sides of the sacroiliac (SI) joint.  This condition causes deep aching or burning pain in the low back. In some cases, the pain may also spread into one or both buttocks, hips, or thighs.  Treatment depends on the cause and severity of your condition.  It may include medicines to reduce pain and swelling or to relax muscles. This information is not intended to replace advice given to you by your health care provider. Make sure you discuss any questions you have with your health care provider. Document Revised: 08/09/2018 Document Reviewed: 01/23/2018 Elsevier Patient Education  [place]. Cervical Radiculopathy  Cervical radiculopathy means that a nerve in the neck (a cervical nerve) is pinched or bruised. This can happen because of an injury to the cervical spine (vertebrae) in the neck, or as a normal part of getting older. This can cause pain or loss of feeling (numbness) that runs from your neck all the way down to your arm and fingers. Often, this condition gets better with rest. Treatment may be needed if the condition does not get better. What are the causes?  A neck injury.  A bulging disk in your spine.  Muscle movements that you cannot control (muscle spasms).  Tight muscles in your neck due to overuse.  Arthritis.  Breakdown in the bones and joints of the spine (spondylosis) due to getting older.  Bone spurs that form near the nerves in the neck. What are the signs or symptoms?  Pain. The pain may: ? Run from the neck to the arm and hand. ? Be very bad or irritating. ? Be worse when you move your neck.  Loss of feeling or tingling in your arm or hand.  Weakness in your arm or hand, in very bad cases. How is this treated? In many cases, treatment is not needed for this condition. With rest, the condition often gets better over time. If treatment is needed, options may include:  Wearing a soft neck collar (cervical collar) for short periods of time, as told by your doctor.  Doing exercises (physical therapy) to strengthen your neck muscles.  Taking medicines.  Having shots (injections) in your spine, in very bad cases.  Having surgery. This may be needed if other treatments do not help. The type of  surgery that is used depends on the cause of your condition. Follow these instructions at home: If you have a soft neck collar:  Wear it as told by your doctor. Remove it only as told by your doctor.  Ask your doctor if you can remove the collar for cleaning and bathing. If you are allowed to remove the collar for cleaning or bathing: ? Follow instructions from your doctor about how to remove the collar safely. ? Clean the collar by wiping it with mild soap and water and drying it completely. ? Take out any removable pads in the collar every 1-2 days. Wash them by hand with soap and water. Let them air-dry completely before you put them back in the collar. ? Check your skin under the collar for redness or sores. If you see any, tell your doctor. Managing pain      Take over-the-counter and prescription medicines only as told by your doctor.  If told, put ice on the painful area. ? If you have a soft neck collar, remove it as told by your doctor. ? Put ice in a plastic bag. ? Place a towel between your skin and the bag. ? Leave the ice on for 20 minutes, 2-3 times a day.  If using ice does not help, you can try using heat. Use the heat source that your doctor recommends, such as a moist heat pack or a heating pad. ? Place a towel between your skin and the heat source. ? Leave the heat on for 20-30 minutes. ? Remove the heat if your skin turns bright red. This is very important if you are unable to feel pain, heat, or cold. You may have a greater risk of getting burned.  You may try a gentle neck and shoulder rub (massage). Activity  Rest as needed.  Return to your normal activities as told by your doctor. Ask your doctor what activities are safe for you.  Do exercises as told by your doctor or physical therapist.  Do not lift anything that is heavier than 10 lb (4.5 kg) until your doctor tells you that it is safe. General instructions  Use a flat pillow when you sleep.  Do  not drive while wearing a soft neck collar. If you do not have a soft neck collar, ask your doctor if it is safe to drive while your neck heals.  Ask your doctor if the medicine prescribed to you requires you to avoid driving or using heavy  machinery.  Do not use any products that contain nicotine or tobacco, such as cigarettes, e-cigarettes, and chewing tobacco. These can delay healing. If you need help quitting, ask your doctor.  Keep all follow-up visits as told by your doctor. This is important. Contact a doctor if:  Your condition does not get better with treatment. Get help right away if:  Your pain gets worse and is not helped with medicine.  You lose feeling or feel weak in your hand, arm, face, or leg.  You have a high fever.  You have a stiff neck.  You cannot control when you poop or pee (have incontinence).  You have trouble with walking, balance, or talking. Summary  Cervical radiculopathy means that a nerve in the neck is pinched or bruised.  A nerve can get pinched from a bulging disk, arthritis, an injury to the neck, or other causes.  Symptoms include pain, tingling, or loss of feeling that goes from the neck into the arm or hand.  Weakness in your arm or hand can happen in very bad cases.  Treatment may include resting, wearing a soft neck collar, and doing exercises. You might need to take medicines for pain. In very bad cases, shots or surgery may be needed. This information is not intended to replace advice given to you by your health care provider. Make sure you discuss any questions you have with your health care provider. Document Revised: 11/03/2018 Document Reviewed: 11/03/2018 Elsevier Patient Education  2020 [place] Following a healthy eating pattern may help you to achieve and maintain a healthy body weight, reduce the risk of chronic disease, and live a long and productive life. It is important to follow a healthy eating  pattern at an appropriate calorie level for your body. Your nutritional needs should be met primarily through food by choosing a variety of nutrient-rich foods. What are tips for following this plan? Reading food labels  Read labels and choose the following: ? Reduced or low sodium. ? Juices with 100% fruit juice. ? Foods with low saturated fats and high polyunsaturated and monounsaturated fats. ? Foods with whole grains, such as whole wheat, cracked wheat, brown rice, and wild rice. ? Whole grains that are fortified with folic acid. This is recommended for women who are pregnant or who want to become pregnant.  Read labels and avoid the following: ? Foods with a lot of added sugars. These include foods that contain brown sugar, corn sweetener, corn syrup, dextrose, fructose, glucose, high-fructose corn syrup, honey, invert sugar, lactose, malt syrup, maltose, molasses, raw sugar, sucrose, trehalose, or turbinado sugar.  Do not eat more than the following amounts of added sugar per day:  6 teaspoons (25 g) for women.  9 teaspoons (38 g) for men. ? Foods that contain processed or refined starches and grains. ? Refined grain products, such as white flour, degermed cornmeal, white bread, and white rice. Shopping  Choose nutrient-rich snacks, such as vegetables, whole fruits, and nuts. Avoid high-calorie and high-sugar snacks, such as potato chips, fruit snacks, and candy.  Use oil-based dressings and spreads on foods instead of solid fats such as butter, stick margarine, or cream cheese.  Limit pre-made sauces, mixes, and "instant" products such as flavored rice, instant noodles, and ready-made pasta.  Try more plant-protein sources, such as tofu, tempeh, black beans, edamame, lentils, nuts, and seeds.  Explore eating plans such as the Mediterranean diet or vegetarian diet. Cooking  Use oil to saut or  stir-fry foods instead of solid fats such as butter, stick margarine, or  lard.  Try baking, boiling, grilling, or broiling instead of frying.  Remove the fatty part of meats before cooking.  Steam vegetables in water or broth. Meal planning   At meals, imagine dividing your plate into fourths: ? One-half of your plate is fruits and vegetables. ? One-fourth of your plate is whole grains. ? One-fourth of your plate is protein, especially lean meats, poultry, eggs, tofu, beans, or nuts.  Include low-fat dairy as part of your daily diet. Lifestyle  Choose healthy options in all settings, including home, work, school, restaurants, or stores.  Prepare your food safely: ? Wash your hands after handling raw meats. ? Keep food preparation surfaces clean by regularly washing with hot, soapy water. ? Keep raw meats separate from ready-to-eat foods, such as fruits and vegetables. ? Cook seafood, meat, poultry, and eggs to the recommended internal temperature. ? Store foods at safe temperatures. In general:  Keep cold foods at 44F (4.4C) or below.  Keep hot foods at 144F (60C) or above.  Keep your freezer at [facility] (-17.8C) or below.  Foods are no longer safe to eat when they have been between the temperatures of 40-144F (4.4-60C) for more than 2 hours. What foods should I eat? Fruits Aim to eat 2 cup-equivalents of fresh, canned (in natural juice), or frozen fruits each day. Examples of 1 cup-equivalent of fruit include 1 small apple, 8 large strawberries, 1 cup canned fruit,  cup dried fruit, or 1 cup 100% juice. Vegetables Aim to eat 2-3 cup-equivalents of fresh and frozen vegetables each day, including different varieties and colors. Examples of 1 cup-equivalent of vegetables include 2 medium carrots, 2 cups raw, leafy greens, 1 cup chopped vegetable (raw or cooked), or 1 medium baked potato. Grains Aim to eat 6 ounce-equivalents of whole grains each day. Examples of 1 ounce-equivalent of grains include 1 slice of bread, 1 cup ready-to-eat cereal, 3  cups popcorn, or  cup cooked rice, pasta, or cereal. Meats and other proteins Aim to eat 5-6 ounce-equivalents of protein each day. Examples of 1 ounce-equivalent of protein include 1 egg, 1/2 cup nuts or seeds, or 1 tablespoon (16 g) peanut butter. A cut of meat or fish that is the size of a deck of cards is about 3-4 ounce-equivalents.  Of the protein you eat each week, try to have at least 8 ounces come from seafood. This includes salmon, trout, herring, and anchovies. Dairy Aim to eat 3 cup-equivalents of fat-free or low-fat dairy each day. Examples of 1 cup-equivalent of dairy include 1 cup (240 mL) milk, 8 ounces (250 g) yogurt, 1 ounces (44 g) natural cheese, or 1 cup (240 mL) fortified soy milk. Fats and oils  Aim for about 5 teaspoons (21 g) per day. Choose monounsaturated fats, such as canola and olive oils, avocados, peanut butter, and most nuts, or polyunsaturated fats, such as sunflower, corn, and soybean oils, walnuts, pine nuts, sesame seeds, sunflower seeds, and flaxseed. Beverages  Aim for six 8-oz glasses of water per day. Limit coffee to three to five 8-oz cups per day.  Limit caffeinated beverages that have added calories, such as soda and energy drinks.  Limit alcohol intake to no more than 1 drink a day for nonpregnant women and 2 drinks a day for men. One drink equals 12 oz of beer (355 mL), 5 oz of wine (148 mL), or 1 oz of hard liquor (44 mL). Seasoning  and other foods  Avoid adding excess amounts of salt to your foods. Try flavoring foods with herbs and spices instead of salt.  Avoid adding sugar to foods.  Try using oil-based dressings, sauces, and spreads instead of solid fats. This information is based on general [place] nutrition guidelines. For more information, visit [employer]. Exact amounts may vary based on your nutrition needs. Summary  A healthy eating plan may help you to maintain a healthy weight, reduce the risk of chronic diseases, and stay  active throughout your life.  Plan your meals. Make sure you eat the right portions of a variety of nutrient-rich foods.  Try baking, boiling, grilling, or broiling instead of frying.  Choose healthy options in all settings, including home, work, school, restaurants, or stores. This information is not intended to replace advice given to you by your health care provider. Make sure you discuss any questions you have with your health care provider. Document Revised: 03/27/2018 Document Reviewed: 03/27/2018 Elsevier Patient Education  [place]. Medroxyprogesterone injection [Contraceptive] What is this medicine? MEDROXYPROGESTERONE (me DROX ee proe JES te rone) contraceptive injections prevent pregnancy. They provide effective birth control for 3 months. Depo-subQ Provera 104 is also used for treating pain related to endometriosis. This medicine may be used for other purposes; ask your health care provider or pharmacist if you have questions. COMMON BRAND NAME(S): Depo-Provera, Depo-subQ Provera 104 What should I tell my health care provider before I take this medicine? They need to know if you have any of these conditions:  frequently drink alcohol  asthma  blood vessel disease or a history of a blood clot in the lungs or legs  bone disease such as osteoporosis  breast cancer  diabetes  eating disorder (anorexia nervosa or bulimia)  high blood pressure  HIV infection or AIDS  kidney disease  liver disease  mental depression  migraine  seizures (convulsions)  stroke  tobacco smoker  vaginal bleeding  an unusual or allergic reaction to medroxyprogesterone, other hormones, medicines, foods, dyes, or preservatives  pregnant or trying to get pregnant  breast-feeding How should I use this medicine? Depo-Provera Contraceptive injection is given into a muscle. Depo-subQ Provera 104 injection is given under the skin. These injections are given by a health care  professional. You must not be pregnant before getting an injection. The injection is usually given during the first 5 days after the start of a menstrual period or 6 weeks after delivery of a baby. Talk to your pediatrician regarding the use of this medicine in children. Special care may be needed. These injections have been used in female children who have started having menstrual periods. Overdosage: If you think you have taken too much of this medicine contact a poison control center or emergency room at once. NOTE: This medicine is only for you. Do not share this medicine with others. What if I miss a dose? Try not to miss a dose. You must get an injection once every 3 months to maintain birth control. If you cannot keep an appointment, call and reschedule it. If you wait longer than 13 weeks between Depo-Provera contraceptive injections or longer than 14 weeks between Depo-subQ Provera 104 injections, you could get pregnant. Use another method for birth control if you miss your appointment. You may also need a pregnancy test before receiving another injection. What may interact with this medicine? Do not take this medicine with any of the following medications:  bosentan This medicine may also interact with the  following medications:  aminoglutethimide  antibiotics or medicines for infections, especially rifampin, rifabutin, rifapentine, and griseofulvin  aprepitant  barbiturate medicines such as phenobarbital or primidone  bexarotene  carbamazepine  medicines for seizures like ethotoin, felbamate, oxcarbazepine, phenytoin, topiramate  modafinil  St. John's wort This list may not describe all possible interactions. Give your health care provider a list of all the medicines, herbs, non-prescription drugs, or dietary supplements you use. Also tell them if you smoke, drink alcohol, or use illegal drugs. Some items may interact with your medicine. What should I watch for while using  this medicine? This drug does not protect you against HIV infection (AIDS) or other sexually transmitted diseases. Use of this product may cause you to lose calcium from your bones. Loss of calcium may cause weak bones (osteoporosis). Only use this product for more than 2 years if other forms of birth control are not right for you. The longer you use this product for birth control the more likely you will be at risk for weak bones. Ask your health care professional how you can keep strong bones. You may have a change in bleeding pattern or irregular periods. Many females stop having periods while taking this drug. If you have received your injections on time, your chance of being pregnant is very low. If you think you may be pregnant, see your health care professional as soon as possible. Tell your health care professional if you want to get pregnant within the next year. The effect of this medicine may last a long time after you get your last injection. What side effects may I notice from receiving this medicine? Side effects that you should report to your doctor or health care professional as soon as possible:  allergic reactions like skin rash, itching or hives, swelling of the face, lips, or tongue  breast tenderness or discharge  breathing problems  changes in vision  depression  feeling faint or lightheaded, falls  fever  pain in the abdomen, chest, groin, or leg  problems with balance, talking, walking  unusually weak or tired  yellowing of the eyes or skin Side effects that usually do not require medical attention (report to your doctor or health care professional if they continue or are bothersome):  acne  fluid retention and swelling  headache  irregular periods, spotting, or absent periods  temporary pain, itching, or skin reaction at site where injected  weight gain This list may not describe all possible side effects. Call your doctor for medical advice about  side effects. You may report side effects to FDA at 1-800-FDA-1088. Where should I keep my medicine? This does not apply. The injection will be given to you by a health care professional. NOTE: This sheet is a summary. It may not cover all possible information. If you have questions about this medicine, talk to your doctor, pharmacist, or health care provider.  2020 Elsevier/[employer] Standard (2009-01-03 18:37:56)
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
## 2020-05-06 LAB — COMP. METABOLIC PANEL (12)
AST: 39 IU/L (ref 0–40)
Albumin/Globulin Ratio: 1.4 (ref 1.2–2.2)
Albumin: 4.1 g/dL (ref 3.8–4.8)
Alkaline Phosphatase: 94 IU/L (ref 39–117)
BUN/Creatinine Ratio: 14 (ref 9–23)
BUN: 10 mg/dL (ref 6–24)
Bilirubin Total: 0.3 mg/dL (ref 0.0–1.2)
Calcium: 9.3 mg/dL (ref 8.7–10.2)
Chloride: 108 mmol/L — ABNORMAL HIGH (ref 96–106)
Creatinine, Ser: 0.71 mg/dL (ref 0.57–1.00)
GFR calc Af Amer: 121 mL/min/{1.73_m2} (ref >59)
GFR calc non Af Amer: 105 mL/min/{1.73_m2} (ref >59)
Globulin, Total: 2.9 g/dL (ref 1.5–4.5)
Glucose: 139 mg/dL — ABNORMAL HIGH (ref 65–99)
Potassium: 3.6 mmol/L (ref 3.5–5.2)
Sodium: 141 mmol/L (ref 134–144)
Total Protein: 7 g/dL (ref 6.0–8.5)

## 2020-05-06 LAB — LIPID PANEL
Chol/HDL Ratio: 3.5 ratio (ref 0.0–4.4)
Cholesterol, Total: 199 mg/dL (ref 100–199)
HDL: 57 mg/dL (ref >39)
LDL Chol Calc (NIH): 122 mg/dL — ABNORMAL HIGH (ref 0–99)
Triglycerides: 110 mg/dL (ref 0–149)
VLDL Cholesterol Cal: 20 mg/dL (ref 5–40)

## 2020-05-06 LAB — MAGNESIUM: Magnesium: 2 mg/dL (ref 1.6–2.3)

## 2020-05-06 LAB — TSH: TSH: 1.12 u[IU]/mL (ref 0.450–4.500)

## 2020-05-06 LAB — VITAMIN B12: Vitamin B-12: 513 pg/mL (ref 232–1245)

## 2020-05-06 LAB — VITAMIN D 25 HYDROXY (VIT D DEFICIENCY, FRACTURES): Vit D, 25-Hydroxy: 18.2 ng/mL — ABNORMAL LOW (ref 30.0–100.0)

## 2020-05-08 ENCOUNTER — Other Ambulatory Visit: Payer: Self-pay | Admitting: Nurse Practitioner

## 2020-05-08 MED ORDER — ERGOCALCIFEROL 1.25 MG (50000 UT) PO CAPS: 50000.0000 [IU] | ORAL_CAPSULE | ORAL | 0 refills | Status: AC

## 2020-05-13 DIAGNOSIS — M545 Low back pain: Secondary | ICD-10-CM | POA: Diagnosis not present

## 2020-05-13 DIAGNOSIS — Z79891 Long term (current) use of opiate analgesic: Secondary | ICD-10-CM | POA: Diagnosis not present

## 2020-05-13 DIAGNOSIS — G8929 Other chronic pain: Secondary | ICD-10-CM | POA: Diagnosis not present

## 2020-05-13 DIAGNOSIS — M79671 Pain in right foot: Secondary | ICD-10-CM | POA: Diagnosis not present

## 2020-05-13 DIAGNOSIS — D860 Sarcoidosis of lung: Secondary | ICD-10-CM | POA: Diagnosis not present

## 2020-05-13 DIAGNOSIS — G894 Chronic pain syndrome: Secondary | ICD-10-CM | POA: Diagnosis not present

## 2020-05-13 DIAGNOSIS — M5431 Sciatica, right side: Secondary | ICD-10-CM | POA: Diagnosis not present

## 2020-05-13 DIAGNOSIS — M25562 Pain in left knee: Secondary | ICD-10-CM | POA: Diagnosis not present

## 2020-05-14 DIAGNOSIS — D860 Sarcoidosis of lung: Secondary | ICD-10-CM | POA: Diagnosis not present

## 2020-06-10 DIAGNOSIS — G8929 Other chronic pain: Secondary | ICD-10-CM | POA: Diagnosis not present

## 2020-06-10 DIAGNOSIS — M25562 Pain in left knee: Secondary | ICD-10-CM | POA: Diagnosis not present

## 2020-06-10 DIAGNOSIS — Z79891 Long term (current) use of opiate analgesic: Secondary | ICD-10-CM | POA: Diagnosis not present

## 2020-06-10 DIAGNOSIS — M25551 Pain in right hip: Secondary | ICD-10-CM | POA: Diagnosis not present

## 2020-06-10 DIAGNOSIS — M545 Low back pain: Secondary | ICD-10-CM | POA: Diagnosis not present

## 2020-06-10 DIAGNOSIS — G894 Chronic pain syndrome: Secondary | ICD-10-CM | POA: Diagnosis not present

## 2020-06-10 DIAGNOSIS — M5431 Sciatica, right side: Secondary | ICD-10-CM | POA: Diagnosis not present

## 2020-06-10 DIAGNOSIS — M79671 Pain in right foot: Secondary | ICD-10-CM | POA: Diagnosis not present

## 2020-06-11 ENCOUNTER — Ambulatory Visit: Payer: Medicare Other

## 2020-06-11 ENCOUNTER — Other Ambulatory Visit: Payer: Self-pay

## 2020-06-11 ENCOUNTER — Ambulatory Visit (INDEPENDENT_AMBULATORY_CARE_PROVIDER_SITE_OTHER): Payer: Medicare Other | Admitting: Nurse Practitioner

## 2020-06-11 DIAGNOSIS — Z3042 Encounter for surveillance of injectable contraceptive: Secondary | ICD-10-CM

## 2020-06-11 LAB — POCT URINE PREGNANCY: Preg Test, Ur: NEGATIVE

## 2020-06-11 MED ORDER — MEDROXYPROGESTERONE ACETATE 150 MG/ML IM SUSP
150.0000 mg | Freq: Once | INTRAMUSCULAR | Status: AC
  Administered 2020-06-11: 150 mg via INTRAMUSCULAR

## 2020-06-11 NOTE — Progress Notes (Signed)
Patient came this morning to receive her depo shot. Patient had a negative pregnancy test. Patient was given depo shot she is due back between September 1-15. Patient was advised.

## 2020-06-13 DIAGNOSIS — D860 Sarcoidosis of lung: Secondary | ICD-10-CM | POA: Diagnosis not present

## 2020-06-17 ENCOUNTER — Ambulatory Visit: Payer: Medicare Other | Admitting: Family Medicine

## 2020-06-20 ENCOUNTER — Other Ambulatory Visit: Payer: Self-pay

## 2020-06-20 ENCOUNTER — Ambulatory Visit (INDEPENDENT_AMBULATORY_CARE_PROVIDER_SITE_OTHER): Payer: Medicare Other | Admitting: Family Medicine

## 2020-06-20 ENCOUNTER — Encounter: Payer: Self-pay | Admitting: Family Medicine

## 2020-06-20 DIAGNOSIS — M542 Cervicalgia: Secondary | ICD-10-CM
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
## 2020-07-08 DIAGNOSIS — M25551 Pain in right hip: Secondary | ICD-10-CM | POA: Diagnosis not present

## 2020-07-08 DIAGNOSIS — M5431 Sciatica, right side: Secondary | ICD-10-CM | POA: Diagnosis not present

## 2020-07-08 DIAGNOSIS — M25562 Pain in left knee: Secondary | ICD-10-CM | POA: Diagnosis not present

## 2020-07-08 DIAGNOSIS — M545 Low back pain: Secondary | ICD-10-CM | POA: Diagnosis not present

## 2020-07-08 DIAGNOSIS — G894 Chronic pain syndrome: Secondary | ICD-10-CM | POA: Diagnosis not present

## 2020-07-08 DIAGNOSIS — G8929 Other chronic pain: Secondary | ICD-10-CM | POA: Diagnosis not present

## 2020-07-08 DIAGNOSIS — Z79891 Long term (current) use of opiate analgesic: Secondary | ICD-10-CM | POA: Diagnosis not present

## 2020-07-08 DIAGNOSIS — M79671 Pain in right foot: Secondary | ICD-10-CM | POA: Diagnosis not present

## 2020-07-13 DIAGNOSIS — D860 Sarcoidosis of lung: Secondary | ICD-10-CM | POA: Diagnosis not present

## 2020-07-24 DIAGNOSIS — D860 Sarcoidosis of lung: Secondary | ICD-10-CM | POA: Diagnosis not present

## 2020-07-24 DIAGNOSIS — D485 Neoplasm of uncertain behavior of skin: Secondary | ICD-10-CM | POA: Diagnosis not present

## 2020-07-24 DIAGNOSIS — L710 Perioral dermatitis: Secondary | ICD-10-CM | POA: Diagnosis not present

## 2020-08-04 ENCOUNTER — Telehealth: Payer: Self-pay | Admitting: Family Medicine

## 2020-08-04 ENCOUNTER — Ambulatory Visit
Admission: RE | Admit: 2020-08-04 | Discharge: 2020-08-04 | Disposition: A | Discharge status: Home / Self Care | Payer: Medicare Other | Source: Ambulatory Visit | Attending: Family Medicine | Admitting: Family Medicine

## 2020-08-04 DIAGNOSIS — M542 Cervicalgia: Secondary | ICD-10-CM | POA: Diagnosis not present

## 2020-08-04 NOTE — Telephone Encounter (Signed)
Neck MRI looks good.  No ruptured discs, no bone spurs, no pinched nerves or arthritis.  No indication for surgery.

## 2020-08-05 DIAGNOSIS — D860 Sarcoidosis of lung: Secondary | ICD-10-CM | POA: Diagnosis not present

## 2020-08-05 DIAGNOSIS — Z79899 Other long term (current) drug therapy: Secondary | ICD-10-CM | POA: Diagnosis not present

## 2020-08-13 DIAGNOSIS — D860 Sarcoidosis of lung: Secondary | ICD-10-CM | POA: Diagnosis not present

## 2020-08-14 DIAGNOSIS — D860 Sarcoidosis of lung: Secondary | ICD-10-CM | POA: Diagnosis not present

## 2020-08-20 NOTE — Addendum Note (Signed)
Addended by: Hortencia Pilar on: 08/20/2020 01:00 PM   Modules accepted: Orders

## 2020-08-21 DIAGNOSIS — G894 Chronic pain syndrome: Secondary | ICD-10-CM | POA: Diagnosis not present

## 2020-08-21 DIAGNOSIS — Z79891 Long term (current) use of opiate analgesic: Secondary | ICD-10-CM | POA: Diagnosis not present

## 2020-08-21 DIAGNOSIS — M79671 Pain in right foot: Secondary | ICD-10-CM | POA: Diagnosis not present

## 2020-08-21 DIAGNOSIS — M25551 Pain in right hip: Secondary | ICD-10-CM | POA: Diagnosis not present

## 2020-08-21 DIAGNOSIS — M25562 Pain in left knee: Secondary | ICD-10-CM | POA: Diagnosis not present

## 2020-08-21 DIAGNOSIS — R519 Headache, unspecified: Secondary | ICD-10-CM | POA: Diagnosis not present

## 2020-08-21 DIAGNOSIS — G8929 Other chronic pain: Secondary | ICD-10-CM | POA: Diagnosis not present

## 2020-08-21 DIAGNOSIS — G890 Central pain syndrome: Secondary | ICD-10-CM | POA: Diagnosis not present

## 2020-09-02 ENCOUNTER — Other Ambulatory Visit: Payer: Self-pay

## 2020-09-02 ENCOUNTER — Ambulatory Visit: Payer: Medicare Other

## 2020-09-02 DIAGNOSIS — Z3042 Encounter for surveillance of injectable contraceptive: Secondary | ICD-10-CM

## 2020-09-02 MED ORDER — MEDROXYPROGESTERONE ACETATE 104 MG/0.65ML ~~LOC~~ SUSY: 104.0000 mg | PREFILLED_SYRINGE | Freq: Once | SUBCUTANEOUS | Status: DC

## 2020-09-02 MED ORDER — MEDROXYPROGESTERONE ACETATE 150 MG/ML IM SUSY: 150.0000 mg | PREFILLED_SYRINGE | INTRAMUSCULAR | Status: AC

## 2020-09-05 ENCOUNTER — Telehealth: Payer: Self-pay

## 2020-09-05 ENCOUNTER — Telehealth: Payer: Self-pay | Admitting: Family Medicine

## 2020-09-05 ENCOUNTER — Ambulatory Visit: Payer: Medicare Other | Admitting: Physical Therapy

## 2020-09-05 NOTE — Telephone Encounter (Signed)
I called - see other message on this from today.

## 2020-09-05 NOTE — Telephone Encounter (Signed)
I called the patient. She does not wish to pursue PT for her neck. Her neck is actually feeling better. The patient is complaining of increased pain in her right knee, though. She says the right leg goes to sleep sometimes and she comes close to falling. I scheduled an appointment for her on 09/23/20 with Dr. Junius Roads to evaluate the knee/leg --- she said she could not come in earlier due to having other doctor's appointments. I did advise her to let us know if she would like to be seen sooner.

## 2020-09-05 NOTE — Telephone Encounter (Signed)
Tried calling the patient - went to voice mail - mailbox was full, so I could not leave a message. Will try again another time.

## 2020-09-05 NOTE — Telephone Encounter (Signed)
Patient called.   She is requesting a call back to figure out why she is in need of physical therapy services.   Call back: [phone]

## 2020-09-05 NOTE — Telephone Encounter (Signed)
Patient called back to the triage line. She states that she did not understand her MRI. Said that no one has explained this to her and she only received the results on mychart. She is unsure why she was referred to therapy. Please call her to further discuss [phone]

## 2020-09-12 ENCOUNTER — Other Ambulatory Visit: Payer: Self-pay | Admitting: Nurse Practitioner

## 2020-09-12 DIAGNOSIS — Z1231 Encounter for screening mammogram for malignant neoplasm of breast: Secondary | ICD-10-CM

## 2020-09-13 DIAGNOSIS — D860 Sarcoidosis of lung: Secondary | ICD-10-CM | POA: Diagnosis not present

## 2020-09-18 DIAGNOSIS — Z79891 Long term (current) use of opiate analgesic: Secondary | ICD-10-CM | POA: Diagnosis not present

## 2020-09-18 DIAGNOSIS — M5431 Sciatica, right side: Secondary | ICD-10-CM | POA: Diagnosis not present

## 2020-09-18 DIAGNOSIS — M25551 Pain in right hip: Secondary | ICD-10-CM | POA: Diagnosis not present

## 2020-09-18 DIAGNOSIS — G894 Chronic pain syndrome: Secondary | ICD-10-CM | POA: Diagnosis not present

## 2020-09-18 DIAGNOSIS — M25562 Pain in left knee: Secondary | ICD-10-CM | POA: Diagnosis not present

## 2020-09-18 DIAGNOSIS — D869 Sarcoidosis, unspecified: Secondary | ICD-10-CM | POA: Diagnosis not present

## 2020-09-18 DIAGNOSIS — M79671 Pain in right foot: Secondary | ICD-10-CM | POA: Diagnosis not present

## 2020-09-18 DIAGNOSIS — G8929 Other chronic pain: Secondary | ICD-10-CM | POA: Diagnosis not present

## 2020-09-23 ENCOUNTER — Ambulatory Visit: Payer: Medicare Other | Admitting: Family Medicine

## 2020-10-07 ENCOUNTER — Other Ambulatory Visit: Payer: Self-pay | Admitting: Nurse Practitioner

## 2020-10-07 ENCOUNTER — Telehealth: Payer: Self-pay | Admitting: Nurse Practitioner

## 2020-10-07 NOTE — Telephone Encounter (Signed)
She already has an order

## 2020-10-09 DIAGNOSIS — Z79899 Other long term (current) drug therapy: Secondary | ICD-10-CM | POA: Diagnosis not present

## 2020-10-09 DIAGNOSIS — D860 Sarcoidosis of lung: Secondary | ICD-10-CM | POA: Diagnosis not present

## 2020-10-09 NOTE — Telephone Encounter (Signed)
Called patient to inform that mammogram order is in [place]. Gave her the number to [facility] to call and schedule at her convince.  [phone]

## 2020-10-10 ENCOUNTER — Other Ambulatory Visit: Payer: Self-pay

## 2020-10-10 ENCOUNTER — Other Ambulatory Visit: Payer: Self-pay | Admitting: Nurse Practitioner

## 2020-10-10 ENCOUNTER — Ambulatory Visit
Admission: RE | Admit: 2020-10-10 | Discharge: 2020-10-10 | Disposition: A | Discharge status: Home / Self Care | Payer: Medicare Other | Source: Ambulatory Visit | Attending: Nurse Practitioner | Admitting: Nurse Practitioner

## 2020-10-10 DIAGNOSIS — Z1231 Encounter for screening mammogram for malignant neoplasm of breast: Secondary | ICD-10-CM

## 2020-10-10 DIAGNOSIS — N63 Unspecified lump in unspecified breast: Secondary | ICD-10-CM

## 2020-10-13 DIAGNOSIS — D860 Sarcoidosis of lung: Secondary | ICD-10-CM | POA: Diagnosis not present

## 2020-10-14 DIAGNOSIS — M5431 Sciatica, right side: Secondary | ICD-10-CM | POA: Diagnosis not present

## 2020-10-14 DIAGNOSIS — M79671 Pain in right foot: Secondary | ICD-10-CM | POA: Diagnosis not present

## 2020-10-14 DIAGNOSIS — Z79891 Long term (current) use of opiate analgesic: Secondary | ICD-10-CM | POA: Diagnosis not present

## 2020-10-14 DIAGNOSIS — M25562 Pain in left knee: Secondary | ICD-10-CM | POA: Diagnosis not present

## 2020-10-14 DIAGNOSIS — D869 Sarcoidosis, unspecified: Secondary | ICD-10-CM | POA: Diagnosis not present

## 2020-10-14 DIAGNOSIS — G8929 Other chronic pain: Secondary | ICD-10-CM | POA: Diagnosis not present

## 2020-10-14 DIAGNOSIS — M25551 Pain in right hip: Secondary | ICD-10-CM | POA: Diagnosis not present

## 2020-10-14 DIAGNOSIS — G894 Chronic pain syndrome: Secondary | ICD-10-CM | POA: Diagnosis not present

## 2020-10-29 ENCOUNTER — Telehealth: Payer: Self-pay | Admitting: Family Medicine

## 2020-10-29 ENCOUNTER — Other Ambulatory Visit: Payer: Self-pay | Admitting: Nurse Practitioner

## 2020-10-29 DIAGNOSIS — D869 Sarcoidosis, unspecified: Secondary | ICD-10-CM | POA: Diagnosis not present

## 2020-10-29 NOTE — Telephone Encounter (Signed)
This is regarding her neck pain. Is it ok to give her a note releasing her of care?

## 2020-10-29 NOTE — Telephone Encounter (Signed)
Patient walked into office requesting a note stating that she has been released from [facility] care so she can give her attorney. She states she doing fine and did not need to follow-up again. Please call when letter is ready for pick up.

## 2020-10-30 NOTE — Telephone Encounter (Signed)
Called and left a voice mail that letter is ready for pickup.

## 2020-10-30 NOTE — Telephone Encounter (Signed)
Letter printed.

## 2020-11-07 DIAGNOSIS — Z79899 Other long term (current) drug therapy: Secondary | ICD-10-CM | POA: Diagnosis not present

## 2020-11-07 DIAGNOSIS — R21 Rash and other nonspecific skin eruption: Secondary | ICD-10-CM | POA: Diagnosis not present

## 2020-11-07 DIAGNOSIS — H5213 Myopia, bilateral: Secondary | ICD-10-CM | POA: Diagnosis not present

## 2020-11-07 DIAGNOSIS — D860 Sarcoidosis of lung: Secondary | ICD-10-CM | POA: Diagnosis not present

## 2020-11-07 DIAGNOSIS — M25571 Pain in right ankle and joints of right foot: Secondary | ICD-10-CM | POA: Diagnosis not present

## 2020-11-11 ENCOUNTER — Ambulatory Visit
Admission: RE | Admit: 2020-11-11 | Discharge: 2020-11-11 | Disposition: A | Discharge status: Home / Self Care | Payer: Medicare Other | Source: Ambulatory Visit | Attending: Nurse Practitioner | Admitting: Nurse Practitioner

## 2020-11-11 ENCOUNTER — Ambulatory Visit: Payer: Medicare Other

## 2020-11-11 ENCOUNTER — Other Ambulatory Visit: Payer: Self-pay | Admitting: Nurse Practitioner

## 2020-11-11 ENCOUNTER — Other Ambulatory Visit: Payer: Self-pay

## 2020-11-11 DIAGNOSIS — D869 Sarcoidosis, unspecified: Secondary | ICD-10-CM

## 2020-11-11 DIAGNOSIS — N63 Unspecified lump in unspecified breast: Secondary | ICD-10-CM

## 2020-11-11 DIAGNOSIS — N644 Mastodynia: Secondary | ICD-10-CM | POA: Diagnosis not present

## 2020-11-11 DIAGNOSIS — D8689 Sarcoidosis of other sites: Secondary | ICD-10-CM | POA: Diagnosis not present

## 2020-11-11 DIAGNOSIS — K7689 Other specified diseases of liver: Secondary | ICD-10-CM | POA: Diagnosis not present

## 2020-11-13 DIAGNOSIS — M79671 Pain in right foot: Secondary | ICD-10-CM | POA: Diagnosis not present

## 2020-11-13 DIAGNOSIS — M25551 Pain in right hip: Secondary | ICD-10-CM | POA: Diagnosis not present

## 2020-11-13 DIAGNOSIS — M545 Low back pain, unspecified: Secondary | ICD-10-CM | POA: Diagnosis not present

## 2020-11-13 DIAGNOSIS — G8929 Other chronic pain: Secondary | ICD-10-CM | POA: Diagnosis not present

## 2020-11-13 DIAGNOSIS — Z79891 Long term (current) use of opiate analgesic: Secondary | ICD-10-CM | POA: Diagnosis not present

## 2020-11-13 DIAGNOSIS — D860 Sarcoidosis of lung: Secondary | ICD-10-CM | POA: Diagnosis not present

## 2020-11-13 DIAGNOSIS — M25562 Pain in left knee: Secondary | ICD-10-CM | POA: Diagnosis not present

## 2020-11-13 DIAGNOSIS — M5431 Sciatica, right side: Secondary | ICD-10-CM | POA: Diagnosis not present

## 2020-11-13 DIAGNOSIS — G894 Chronic pain syndrome: Secondary | ICD-10-CM | POA: Diagnosis not present

## 2020-11-30 ENCOUNTER — Emergency Department (HOSPITAL_COMMUNITY)
Admission: EM | Admit: 2020-11-30 | Discharge: 2020-11-30 | Disposition: A | Discharge status: Home / Self Care | Payer: Medicare Other | Attending: Emergency Medicine | Admitting: Emergency Medicine

## 2020-11-30 ENCOUNTER — Other Ambulatory Visit: Payer: Self-pay

## 2020-11-30 ENCOUNTER — Encounter (HOSPITAL_COMMUNITY): Payer: Self-pay | Admitting: Emergency Medicine

## 2020-11-30 DIAGNOSIS — R21 Rash and other nonspecific skin eruption: Secondary | ICD-10-CM

## 2020-11-30 DIAGNOSIS — Z87891 Personal history of nicotine dependence: Secondary | ICD-10-CM | POA: Insufficient documentation

## 2020-11-30 MED ORDER — HYDROCORTISONE 1 % EX CREA: TOPICAL_CREAM | CUTANEOUS | 0 refills | Status: DC
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
## 2020-11-30 NOTE — Discharge Instructions (Signed)
At this time there does not appear to be the presence of an emergent medical condition, however there is always the potential for conditions to change. Please read and follow the below instructions.  Please return to the Emergency Department immediately for any new or worsening symptoms. Go to your primary care doctor's appointment tomorrow morning as scheduled and have your primary care provider reevaluate the area.  You refused further testing such as testing for STDs like syphilis today please discuss those tests with your primary care provider tomorrow morning. You may use the hydrocortisone cream as prescribed to help with your rash.    Go to the nearest Emergency Department immediately if: You have fever or chills You have a fever and your symptoms suddenly get worse. You start to feel mixed up (confused). You have a very bad headache or a stiff neck. You have very bad joint pains or stiffness. You have jerky movements that you cannot control (seizure). Your rash covers all or most of your body. The rash may or may not be painful. You have blisters that: Are on top of the rash. Grow larger. Grow together. Are painful. Are inside your nose or mouth. You have a rash that: Looks like purple pinprick-sized spots all over your body. Has a "bull's eye" or looks like a target. Is red and painful, causes your skin to peel, and is not from being in the sun too long. You have any new/concerning or worsening of symptoms    Please read the additional information packets attached to your discharge summary.  Do not take your medicine if  develop an itchy rash, swelling in your mouth or lips, or difficulty breathing; call 911 and seek immediate emergency medical attention if this occurs.  You may review your lab tests and imaging results in their entirety on your MyChart account.  Please discuss all results of fully with your primary care provider and other specialist at your follow-up  visit.  Note: Portions of this text may have been transcribed using voice recognition software. Every effort was made to ensure accuracy; however, inadvertent computerized transcription errors may still be present.

## 2020-11-30 NOTE — ED Provider Notes (Signed)
[facility] EMERGENCY DEPARTMENT Provider Note   CSN: [id number] Arrival date & time: 11/30/20  1728     History Chief Complaint  Patient presents with  . skin irritation    Rhonda Hester is a 44 y.o. female history of allergies, eczema, fibroids, GERD, Bell's palsy, kidney stones, hyperlipidemia, sarcoidosis.  Patient presents today for evaluation of rash to her right hand she noticed a large red spot to the ulnar side of her right palm it appeared to be a scab initially and then slowly dissipated and now appears only his dry skin.  She reports that is mildly itchy but is not associated with pain.  Has gradually resolved over the last 2 weeks.  She had 2 smaller spots pop up on her fingers which have since resolved as well.  She has not attempted any medication for her symptoms prior to arrival.  Denies fever/chills, headache, difficulty swallowing, swelling the face/head/neck, nausea/vomiting, abdominal pain, rashes elsewhere on the body, arthralgias/myalgias or any additional concerns.  HPI     Past Medical History:  Diagnosis Date  . Allergic asthma   . Allergy    SEASONAL  . Anxiety   . Breast mass 05/2017   lt breast lump  . Eczema   . Fibroid   . GERD (gastroesophageal reflux disease)   . History of anal fissures   . History of Bell's palsy    2006  RIGHT SIDE--  RESOLVED  . History of gastroesophageal reflux (GERD)   . History of kidney stones   . Hyperlipidemia   . Interstitial cystitis   . Sarcoidosis of lung ([place])    DX 2006--  PULMOLOGIST--  DR XAJO- on 2 liters of oxygen, goes to pain clinic  . Seasonal allergic rhinitis   . Shortness of breath   . Vaginal Pap smear, abnormal     Patient Active Problem List   Diagnosis Date Noted  . Moderate episode of recurrent major depressive disorder ([place]) 05/05/2020  . Dysmenorrhea 04/23/2020  . Enlarged submental lymph node 03/30/2018  . Nodule of soft tissue 03/22/2018  . Hemorrhoids  02/13/2018  . TMJ (temporomandibular joint disorder) 12/23/2017  . Acute pain of right shoulder 11/26/2017  . Chronic bilateral low back pain without sciatica 08/01/2017  . Chronic pain of right knee 08/01/2017  . Depression due to physical illness 04/02/2017  . Pelvic pain 03/28/2017  . Obesity (BMI 30.0-34.9) 03/19/2017  . Generalized pain 03/19/2017  . Metabolic syndrome 87/86/7672  . Medication management 03/19/2017  . Tobacco abuse 01/24/2017  . Uterine fibroid 01/17/2017  . Interstitial cystitis 01/17/2017  . Menorrhagia 12/06/2016  . Acne vulgaris 05/10/2016  . BMI 31.0-31.9,adult 05/10/2016  . Weight gain 05/10/2016  . Right hip pain 10/27/2014  . Sarcoidosis of lung ([place])   . Cervical adenopathy 03/12/2014  . History of smoking 08/07/2013  . Allergic asthma 07/22/2011    Past Surgical History:  Procedure Laterality Date  . CYSTO WITH HYDRODISTENSION N/A 08/24/2013   Procedure: CYSTOSCOPY/HYDRODISTENSION with instillation of marcaine and pyridium;  Surgeon: Fredricka Bonine, MD;  Location: [facility];  Service: Urology;  Laterality: N/A;  . CYSTOSCOPY/ HYDRODISTENTION/ BLADDER BX  06-09-2010  . CYSTOSCOPY/URETEROSCOPY/HOLMIUM LASER/STENT PLACEMENT Right 01/30/2019   Procedure: CYSTOSCOPY/RETROGRADE/URETEROSCOPY/HOLMIUM LASER/STENT PLACEMENT;  Surgeon: Festus Aloe, MD;  Location: [facility];  Service: Urology;  Laterality: Right;  . DILATION AND CURETTAGE OF UTERUS  1997  . EXTRACORPOREAL SHOCK WAVE LITHOTRIPSY Right 11/30/2018   Procedure: EXTRACORPOREAL SHOCK WAVE LITHOTRIPSY (ESWL);  Surgeon: Bjorn Loser, MD;  Location: [facility];  Service: Urology;  Laterality: Right;  . IR TRANSCATHETER BX  06/14/2018  . IR US GUIDE VASC ACCESS RIGHT  06/14/2018  . IR VENOGRAM HEPATIC W HEMODYNAMIC EVALUATION  06/14/2018     OB History    Gravida  1   Para  0   Term  0   Preterm  0   AB  1   Living  0     SAB  1   TAB  0   Ectopic  0    Multiple  0   Live Births              Family History  Problem Relation Age of Onset  . Brain cancer Maternal Grandmother   . Hyperlipidemia Other   . Sleep apnea Other   . Depression Maternal Aunt   . Seizures Maternal Uncle   . Colon cancer Neg Hx   . Stomach cancer Neg Hx     Social History   Tobacco Use  . Smoking status: Former Smoker    Packs/day: 0.30    Years: 6.00    Pack years: 1.80    Types: Cigarettes, Cigars    Quit date: 11/2016    Years since quitting: 4.0  . Smokeless tobacco: Never Used  . Tobacco comment: ONE CIG. DAILY /  1 PPMONTH  Vaping Use  . Vaping Use: Never used  Substance Use Topics  . Alcohol use: No    Alcohol/week: 0.0 standard drinks  . Drug use: No    Comment: HX MARIJUANA USE    Home Medications Prior to Admission medications   Medication Sig Start Date End Date Taking? Authorizing Provider  albuterol (PROAIR HFA) 108 (90 Base) MCG/ACT inhaler Inhale 2 puffs into the lungs every 6 (six) hours as needed for shortness of breath. 05/10/18   Dorena Dew, FNP  baclofen (LIORESAL) 10 MG tablet Take 0.5-1 tablets (5-10 mg total) by mouth 3 (three) times daily as needed for muscle spasms. Patient not taking: Reported on 06/20/2020 04/24/20   Hilts, Legrand Como, MD  Cetirizine HCl 10 MG CAPS Take 1 capsule (10 mg total) by mouth daily. 05/11/19   Wieters, Hallie C, PA-C  cyclobenzaprine (FLEXERIL) 10 MG tablet Take 10 mg by mouth 3 (three) times daily as needed for muscle spasms. Patient not taking: Reported on 06/20/2020    [provider]  DULERA 200-5 MCG/ACT AERO Inhale 2 puffs into the lungs 2 (two) times daily. 03/29/20   [provider]  folic acid (FOLVITE) 1 MG tablet Take 1 mg by mouth every Wednesday.     [provider]  hydrocortisone cream 1 % Apply to affected area 2 times daily 11/30/20   Nuala Alpha A, PA-C  ibuprofen (ADVIL) 200 MG tablet Take 600-800 mg by mouth every 6 (six) hours as needed for  moderate pain.    [provider]  medroxyPROGESTERone (DEPO-PROVERA) 150 MG/ML injection Inject 1 mL (150 mg total) into the muscle every 3 (three) months. 04/23/20   Chancy Milroy, MD  meloxicam (MOBIC) 15 MG tablet Take 15 mg by mouth daily as needed for pain.  09/14/18   [provider]  methocarbamol (ROBAXIN) 500 MG tablet Take 2 tablets (1,000 mg total) by mouth 4 (four) times daily. Patient not taking: Reported on 04/24/2020 04/11/20   Carlisle Cater, PA-C  methotrexate (RHEUMATREX) 2.5 MG tablet Take 2.5 mg by mouth every Wednesday. Caution: Chemotherapy. Protect from light.  [provider]  Oxycodone HCl 10 MG TABS Take 10 mg by mouth every 4 (four) hours as needed (pain).    [provider]  predniSONE (DELTASONE) 10 MG tablet Take as directed for 12 days.  Daily dose 6,6,5,5,4,4,3,3,2,2,1,1. Patient not taking: Reported on 05/05/2020 04/24/20   Hilts, Legrand Como, MD  tiZANidine (ZANAFLEX) 4 MG tablet Take 4 mg by mouth 3 (three) times daily. 06/10/20   [provider]  famotidine (PEPCID) 20 MG tablet Take 1 tablet (20 mg total) by mouth 2 (two) times daily. 05/24/19 12/22/19  Lanae Boast, FNP    Allergies    Acyclovir and related, Citric acid, Dust mite extract, Grapeseed extract [nutritional supplements], Pineapple, Pollen extract, and Nickel  Review of Systems   Review of Systems  Constitutional: Negative.  Negative for chills and fever.  HENT: Negative.  Negative for facial swelling, sore throat, trouble swallowing and voice change.   Respiratory: Negative.  Negative for cough and shortness of breath.   Gastrointestinal: Negative for abdominal pain, diarrhea, nausea and vomiting.  Musculoskeletal: Negative.  Negative for arthralgias and myalgias.  Skin: Positive for rash.    Physical Exam Updated Vital Signs BP (!) 149/80   Pulse 100   Temp 98.8 F (37.1 C) (Oral)   Resp 18   Ht 5\' 7"  (1.702 m)   Wt 104.3 kg   SpO2 98%    BMI 36.02 kg/m   Physical Exam Constitutional:      General: She is not in acute distress.    Appearance: Normal appearance. She is well-developed. She is not ill-appearing or diaphoretic.  HENT:     Head: Normocephalic and atraumatic.     Nose: Nose normal.     Mouth/Throat:     Mouth: Mucous membranes are moist.      Comments: The patient has normal phonation and is in control of secretions. No stridor.  Midline uvula without edema. Soft palate rises symmetrically. No tonsillar erythema, swelling or exudates. Tongue protrusion is normal, floor of mouth is soft. No trismus. No creptius on neck palpation. Mucus membranes moist. No pallor noted. - Subcentimeter area of gingival erythema and possible excoriation patient attributes to brushing her teeth.  No surrounding edema drainage or fluctuance.  Eyes:     General: Vision grossly intact. Gaze aligned appropriately.     Extraocular Movements: Extraocular movements intact.     Conjunctiva/sclera: Conjunctivae normal.     Pupils: Pupils are equal, round, and reactive to light.  Neck:     Trachea: Trachea and phonation normal.  Pulmonary:     Effort: Pulmonary effort is normal. No respiratory distress.     Breath sounds: Normal air entry.  Abdominal:     General: There is no distension.     Palpations: Abdomen is soft.     Tenderness: There is no abdominal tenderness. There is no guarding or rebound.  Musculoskeletal:        General: Normal range of motion.     Cervical back: Normal range of motion.  Skin:    General: Skin is warm and dry.          Comments: Faint rash present to the ulnar side of the right palm with some surrounding dry skin.  No tenderness erythema fluctuance induration or pain with movement of the joints.  See picture below.  Additionally 2 small areas of dry skin to the proximal palmar index and middle fingers.  Neurological:     Mental Status: She is alert.  GCS: GCS eye subscore is 4. GCS verbal  subscore is 5. GCS motor subscore is 6.     Comments: Speech is clear and goal oriented, follows commands Major Cranial nerves without deficit, no facial droop Moves extremities without ataxia, coordination intact  Psychiatric:        Behavior: Behavior normal.         ED Results / Procedures / Treatments   Labs (all labs ordered are listed, but only abnormal results are displayed) Labs Reviewed - No data to display  EKG None  Radiology No results found.  Procedures Procedures (including critical care time)  Medications Ordered in ED Medications - No data to display  ED Course  I have reviewed the triage vital signs and the nursing notes.  Pertinent labs & imaging results that were available during my care of the patient were reviewed by me and considered in my medical decision making (see chart for details).    MDM Rules/Calculators/A&P                         Additional history obtained from: 1. Nursing notes from this visit. ------------- 44 year old female presented with a 2-week history of rash to her right palm no clear inciting event. Patient denies any difficulty breathing or swallowing.  Pt has a patent airway without stridor and is handling secretions without difficulty; no angioedema.  Does not appear to be allergic reaction there is no evidence of anaphylaxis.  She has no rashes elsewhere to the body, low suspicion for superimposed infection, tickborne illness [facility] [place] TSS or other life-threatening conditions.  I did discuss possibility and possible delayed presentation of syphilis rash with the patient given it is located on the palm however she is adamant that she has not been sexually active in several years, offered patient STI testing today but she refused.  Patient reports she has a PCP appointment tomorrow morning I have encouraged her to go to that appointment and discuss the rash and further testing with her primary care provider.  Patient does have a  history of eczema possible that this may be related, will start patient on hydrocortisone cream.  She should see her PCP tomorrow morning.  On exam there was noted a small area of erythema and excoriation to the left side of the roof of the mouth, patient reports that she scraped herself with her toothbrush the other day and recently changed toothpaste this may be secondary to that there is no evidence of dental abscess or other emergent pathologies, encouraged patient discussed with her PCP tomorrow.  Does not appea   At this time there does not appear to be any evidence of an acute emergency medical condition and the patient appears stable for discharge with appropriate outpatient follow up. Diagnosis was discussed with patient who verbalizes understanding of care plan and is agreeable to discharge. I have discussed return precautions with patient who verbalizes understanding. Patient encouraged to follow-up with their PCP. All questions answered.  Patient's case discussed with Dr. Almyra Free who agrees with plan to discharge with steroid cream and PCP follow-up.   Note: Portions of this report may have been transcribed using voice recognition software. Every effort was made to ensure accuracy; however, inadvertent computerized transcription errors may still be present.  Final Clinical Impression(s) / ED Diagnoses Final diagnoses:  Rash and nonspecific skin eruption    Rx / DC Orders ED Discharge Orders  Ordered    hydrocortisone cream 1 %        11/30/20 1854           Gari Crown 11/30/20 1910    Luna Fuse, MD 12/05/20 [phone]

## 2020-12-01 ENCOUNTER — Ambulatory Visit (INDEPENDENT_AMBULATORY_CARE_PROVIDER_SITE_OTHER): Payer: Medicare Other | Admitting: Nurse Practitioner

## 2020-12-01 VITALS — BP 121/82 | HR 80 | Resp 20 | Ht 67.0 in | Wt 227.0 lb

## 2020-12-01 DIAGNOSIS — N926 Irregular menstruation, unspecified: Secondary | ICD-10-CM

## 2020-12-01 DIAGNOSIS — R21 Rash and other nonspecific skin eruption: Secondary | ICD-10-CM | POA: Diagnosis not present

## 2020-12-01 DIAGNOSIS — Z3042 Encounter for surveillance of injectable contraceptive: Secondary | ICD-10-CM | POA: Diagnosis not present

## 2020-12-01 DIAGNOSIS — N921 Excessive and frequent menstruation with irregular cycle: Secondary | ICD-10-CM | POA: Diagnosis not present

## 2020-12-01 LAB — POCT URINE PREGNANCY: Preg Test, Ur: NEGATIVE

## 2020-12-01 MED ORDER — MEDROXYPROGESTERONE ACETATE 150 MG/ML IM SUSP
150.0000 mg | Freq: Once | INTRAMUSCULAR | Status: AC
  Administered 2020-12-01: 150 mg via INTRAMUSCULAR

## 2020-12-01 NOTE — Patient Instructions (Signed)
Medroxyprogesterone injection [Contraceptive] What is this medicine? MEDROXYPROGESTERONE (me DROX ee proe JES te rone) contraceptive injections prevent pregnancy. They provide effective birth control for 3 months. Depo-subQ Provera 104 is also used for treating pain related to endometriosis. This medicine may be used for other purposes; ask your health care provider or pharmacist if you have questions. COMMON BRAND NAME(S): Depo-Provera, Depo-subQ Provera 104 What should I tell my health care provider before I take this medicine? They need to know if you have any of these conditions:  frequently drink alcohol  asthma  blood vessel disease or a history of a blood clot in the lungs or legs  bone disease such as osteoporosis  breast cancer  diabetes  eating disorder (anorexia nervosa or bulimia)  high blood pressure  HIV infection or AIDS  kidney disease  liver disease  mental depression  migraine  seizures (convulsions)  stroke  tobacco smoker  vaginal bleeding  an unusual or allergic reaction to medroxyprogesterone, other hormones, medicines, foods, dyes, or preservatives  pregnant or trying to get pregnant  breast-feeding How should I use this medicine? Depo-Provera Contraceptive injection is given into a muscle. Depo-subQ Provera 104 injection is given under the skin. These injections are given by a health care professional. You must not be pregnant before getting an injection. The injection is usually given during the first 5 days after the start of a menstrual period or 6 weeks after delivery of a baby. Talk to your pediatrician regarding the use of this medicine in children. Special care may be needed. These injections have been used in female children who have started having menstrual periods. Overdosage: If you think you have taken too much of this medicine contact a poison control center or emergency room at once. NOTE: This medicine is only for you. Do not  share this medicine with others. What if I miss a dose? Try not to miss a dose. You must get an injection once every 3 months to maintain birth control. If you cannot keep an appointment, call and reschedule it. If you wait longer than 13 weeks between Depo-Provera contraceptive injections or longer than 14 weeks between Depo-subQ Provera 104 injections, you could get pregnant. Use another method for birth control if you miss your appointment. You may also need a pregnancy test before receiving another injection. What may interact with this medicine? Do not take this medicine with any of the following medications:  bosentan This medicine may also interact with the following medications:  aminoglutethimide  antibiotics or medicines for infections, especially rifampin, rifabutin, rifapentine, and griseofulvin  aprepitant  barbiturate medicines such as phenobarbital or primidone  bexarotene  carbamazepine  medicines for seizures like ethotoin, felbamate, oxcarbazepine, phenytoin, topiramate  modafinil  St. John's wort This list may not describe all possible interactions. Give your health care provider a list of all the medicines, herbs, non-prescription drugs, or dietary supplements you use. Also tell them if you smoke, drink alcohol, or use illegal drugs. Some items may interact with your medicine. What should I watch for while using this medicine? This drug does not protect you against HIV infection (AIDS) or other sexually transmitted diseases. Use of this product may cause you to lose calcium from your bones. Loss of calcium may cause weak bones (osteoporosis). Only use this product for more than 2 years if other forms of birth control are not right for you. The longer you use this product for birth control the more likely you will be at risk   for weak bones. Ask your health care professional how you can keep strong bones. You may have a change in bleeding pattern or irregular periods.  Many females stop having periods while taking this drug. If you have received your injections on time, your chance of being pregnant is very low. If you think you may be pregnant, see your health care professional as soon as possible. Tell your health care professional if you want to get pregnant within the next year. The effect of this medicine may last a long time after you get your last injection. What side effects may I notice from receiving this medicine? Side effects that you should report to your doctor or health care professional as soon as possible:  allergic reactions like skin rash, itching or hives, swelling of the face, lips, or tongue  breast tenderness or discharge  breathing problems  changes in vision  depression  feeling faint or lightheaded, falls  fever  pain in the abdomen, chest, groin, or leg  problems with balance, talking, walking  unusually weak or tired  yellowing of the eyes or skin Side effects that usually do not require medical attention (report to your doctor or health care professional if they continue or are bothersome):  acne  fluid retention and swelling  headache  irregular periods, spotting, or absent periods  temporary pain, itching, or skin reaction at site where injected  weight gain This list may not describe all possible side effects. Call your doctor for medical advice about side effects. You may report side effects to FDA at 1-800-FDA-1088. Where should I keep my medicine? This does not apply. The injection will be given to you by a health care professional. NOTE: This sheet is a summary. It may not cover all possible information. If you have questions about this medicine, talk to your doctor, pharmacist, or health care provider.  2020 Elsevier/Gold Standard (2009-01-03 18:37:56)  

## 2020-12-01 NOTE — Progress Notes (Signed)
[place], [place]  [address] Phone:  [phone]   Fax:  [phone]   Established Patient Office Visit  Subjective:  Patient ID: Rhonda Hester, female    DOB: [birth year]  Age: 44 y.o. MRN: [id number]  CC:  Chief Complaint  Patient presents with  . Follow-up    HPI Rhonda Hester presents for irregular menstration  Irregular Menstruation Patient complains of irregular menses. Patient's last menstrual period was 11/09/2020.  Periods are irregular, lasting a few days. Dysmenorrhea:none. Cyclic symptoms include: none. Current contraception: none. History of abnormal Pap smear: yes HPV. She is prescribed the Depo from her GYN provider but prefers to have medication administer via our office. She admits that she is up to date with her doses however her last documented dose was 06/11/20.  She is complaining of an itchy area to her right hand. She admits that it has been hurting started on 2 weeks with a dry patch. She thought that it was a bite. She was seen in the ER on yesterday. She did not feel like she could wait until this apt. Denies headache, dizziness, visual changes, shortness of breath, dyspnea on exertion, chest pain, nausea, vomiting or any edema.    Past Medical History:  Diagnosis Date  . Allergic asthma   . Allergy    SEASONAL  . Anxiety   . Breast mass 05/2017   lt breast lump  . Eczema   . Fibroid   . GERD (gastroesophageal reflux disease)   . History of anal fissures   . History of Bell's palsy    2006  RIGHT SIDE--  RESOLVED  . History of gastroesophageal reflux (GERD)   . History of kidney stones   . Hyperlipidemia   . Interstitial cystitis   . Sarcoidosis of lung ([place])    DX 2006--  PULMOLOGIST--  DR [facility]- on 2 liters of oxygen, goes to pain clinic  . Seasonal allergic rhinitis   . Shortness of breath   . Vaginal Pap smear, abnormal     Past Surgical History:  Procedure Laterality Date  . CYSTO  WITH HYDRODISTENSION N/A 08/24/2013   Procedure: CYSTOSCOPY/HYDRODISTENSION with instillation of marcaine and pyridium;  Surgeon: Fredricka Bonine, MD;  Location: [facility];  Service: Urology;  Laterality: N/A;  . CYSTOSCOPY/ HYDRODISTENTION/ BLADDER BX  06-09-2010  . CYSTOSCOPY/URETEROSCOPY/HOLMIUM LASER/STENT PLACEMENT Right 01/30/2019   Procedure: CYSTOSCOPY/RETROGRADE/URETEROSCOPY/HOLMIUM LASER/STENT PLACEMENT;  Surgeon: Festus Aloe, MD;  Location: [facility];  Service: Urology;  Laterality: Right;  . DILATION AND CURETTAGE OF UTERUS  1997  . EXTRACORPOREAL SHOCK WAVE LITHOTRIPSY Right 11/30/2018   Procedure: EXTRACORPOREAL SHOCK WAVE LITHOTRIPSY (ESWL);  Surgeon: Bjorn Loser, MD;  Location: [facility];  Service: Urology;  Laterality: Right;  . IR TRANSCATHETER BX  06/14/2018  . IR US GUIDE VASC ACCESS RIGHT  06/14/2018  . IR VENOGRAM HEPATIC W HEMODYNAMIC EVALUATION  06/14/2018    Family History  Problem Relation Age of Onset  . Brain cancer Maternal Grandmother   . Hyperlipidemia Other   . Sleep apnea Other   . Depression Maternal Aunt   . Seizures Maternal Uncle   . Colon cancer Neg Hx   . Stomach cancer Neg Hx     Social History   Socioeconomic History  . Marital status: Single    Spouse name: Not on file  . Number of children: 0  . Years of education: Not on file  . Highest education level: Not  on file  Occupational History  . Occupation: disabled  Tobacco Use  . Smoking status: Former Smoker    Packs/day: 0.30    Years: 6.00    Pack years: 1.80    Types: Cigarettes, Cigars    Quit date: 11/2016    Years since quitting: 4.0  . Smokeless tobacco: Never Used  . Tobacco comment: ONE CIG. DAILY /  1 PPMONTH  Vaping Use  . Vaping Use: Never used  Substance and Sexual Activity  . Alcohol use: No    Alcohol/week: 0.0 standard drinks  . Drug use: No    Comment: HX MARIJUANA USE  . Sexual activity: Not Currently    Birth control/protection:  None    Comment: abstinence   Other Topics Concern  . Not on file  Social History Narrative  . Not on file   Social Determinants of Health   Financial Resource Strain: Not on file  Food Insecurity: Not on file  Transportation Needs: Not on file  Physical Activity: Not on file  Stress: Not on file  Social Connections: Not on file  Intimate Partner Violence: Not on file    Outpatient Medications Prior to Visit  Medication Sig Dispense Refill  . albuterol (PROAIR HFA) 108 (90 Base) MCG/ACT inhaler Inhale 2 puffs into the lungs every 6 (six) hours as needed for shortness of breath. 1 Inhaler 5  . Cetirizine HCl 10 MG CAPS Take 1 capsule (10 mg total) by mouth daily. 20 capsule 0  . DULERA 200-5 MCG/ACT AERO Inhale 2 puffs into the lungs 2 (two) times daily.    . folic acid (FOLVITE) 1 MG tablet Take by mouth.    . hydrocortisone cream 1 % Apply to affected area 2 times daily 1.5 g 0  . ibuprofen (ADVIL) 200 MG tablet Take 600-800 mg by mouth every 6 (six) hours as needed for moderate pain.    Marland Kitchen inFLIXimab (REMICADE) 100 MG injection Inject into the vein.    . methotrexate (RHEUMATREX) 2.5 MG tablet Take 2.5 mg by mouth every Wednesday. Caution: Chemotherapy. Protect from light.    . Oxycodone HCl 10 MG TABS Take 10 mg by mouth every 4 (four) hours as needed (pain).    Marland Kitchen tiZANidine (ZANAFLEX) 4 MG tablet Take 4 mg by mouth 3 (three) times daily.    . folic acid (FOLVITE) 1 MG tablet Take 1 mg by mouth every Wednesday.     . medroxyPROGESTERone (DEPO-PROVERA) 150 MG/ML injection Inject 1 mL (150 mg total) into the muscle every 3 (three) months. 1 mL 3  . medroxyPROGESTERone Acetate 150 MG/ML SUSY Inject into the muscle.    . meloxicam (MOBIC) 15 MG tablet Take 15 mg by mouth daily as needed for pain.   0  . medroxyPROGESTERone Acetate 150 MG/ML SUSY Inject into the muscle.    . baclofen (LIORESAL) 10 MG tablet Take 0.5-1 tablets (5-10 mg total) by mouth 3 (three) times daily as needed  for muscle spasms. (Patient not taking: Reported on 06/20/2020) 30 each 3  . cyclobenzaprine (FLEXERIL) 10 MG tablet Take 10 mg by mouth 3 (three) times daily as needed for muscle spasms. (Patient not taking: Reported on 06/20/2020)    . methocarbamol (ROBAXIN) 500 MG tablet Take 2 tablets (1,000 mg total) by mouth 4 (four) times daily. (Patient not taking: Reported on 04/24/2020) 30 tablet 0  . predniSONE (DELTASONE) 10 MG tablet Take as directed for 12 days.  Daily dose 6,6,5,5,4,4,3,3,2,2,1,1. (Patient not taking: Reported on 05/05/2020)  42 tablet 0   Facility-Administered Medications Prior to Visit  Medication Dose Route Frequency Provider Last Rate Last Admin  . bupivacaine (MARCAINE) 0.5 % 15 mL, phenazopyridine (PYRIDIUM) 400 mg bladder mixture   Bladder Instillation Once Festus Aloe, MD        Allergies  Allergen Reactions  . Acyclovir And Related Other (See Comments)    Patient states it "made me feel like I can't breath"   . Citric Acid Other (See Comments)    AVOIDS DUE TO INTERSTITIAL CYSTITIS  . Dust Mite Extract Itching and Other (See Comments)     coughing  . Grapeseed Extract [Nutritional Supplements] Other (See Comments)    Throat itching and burning  . Pineapple Itching    Burning in mouth and tingling lips  . Pollen Extract Itching and Other (See Comments)     coughing  . Nickel Rash    ROS Review of Systems  Respiratory: Positive for cough. Negative for shortness of breath.   Cardiovascular: Negative for chest pain.      Objective:    Physical Exam Constitutional:      Appearance: She is obese.  HENT:     Head: Normocephalic and atraumatic.  Cardiovascular:     Rate and Rhythm: Normal rate and regular rhythm.     Pulses: Normal pulses.     Heart sounds: Normal heart sounds.  Pulmonary:     Effort: Pulmonary effort is normal.     Breath sounds: Normal breath sounds.  Musculoskeletal:        General: Normal range of motion.     Cervical back:  Normal range of motion.  Skin:    General: Skin is warm and dry.     Capillary Refill: Capillary refill takes less than 2 seconds.     Comments: Superficial area to right palm; finger and at the based of her thumb. Slight dark area to her finger with more scaling. No s/s of any infection.  Questionable fungal rash  Neurological:     General: No focal deficit present.     Mental Status: She is alert and oriented to person, place, and time.  Psychiatric:        Behavior: Behavior normal.     BP 121/82 (BP Location: Right Arm, Patient Position: Sitting, Cuff Size: Large)   Pulse 80   Resp 20   Ht 5\' 7"  (1.702 m)   Wt 227 lb (103 kg)   LMP 11/09/2020   SpO2 97%   BMI 35.55 kg/m  Wt Readings from Last 3 Encounters:  12/01/20 227 lb (103 kg)  11/30/20 230 lb (104.3 kg)  05/05/20 228 lb 3.2 oz (103.5 kg)     There are no preventive care reminders to display for this patient.  There are no preventive care reminders to display for this patient.  Lab Results  Component Value Date   TSH 1.120 05/05/2020   Lab Results  Component Value Date   WBC 5.3 01/30/2019   HGB 11.2 (L) 01/30/2019   HCT 33.0 (L) 01/30/2019   MCV 90.4 01/30/2019   PLT 267 01/30/2019   Lab Results  Component Value Date   NA 141 05/05/2020   K 3.6 05/05/2020   CO2 25 11/11/2018   GLUCOSE 139 (H) 05/05/2020   BUN 10 05/05/2020   CREATININE 0.71 05/05/2020   BILITOT 0.3 05/05/2020   ALKPHOS 94 05/05/2020   AST 39 05/05/2020   ALT 45 (H) 11/11/2018   PROT 7.0 05/05/2020  ALBUMIN 4.1 05/05/2020   CALCIUM 9.3 05/05/2020   ANIONGAP 9 11/11/2018   GFR 138.73 04/25/2018   Lab Results  Component Value Date   CHOL 199 05/05/2020   Lab Results  Component Value Date   HDL 57 05/05/2020   Lab Results  Component Value Date   LDLCALC 122 (H) 05/05/2020   Lab Results  Component Value Date   TRIG 110 05/05/2020   Lab Results  Component Value Date   CHOLHDL 3.5 05/05/2020   Lab Results   Component Value Date   HGBA1C 4.9 05/10/2018      Assessment & Plan:   Problem List Items Addressed This Visit   None   Visit Diagnoses    Encounter for Depo-Provera contraception    -  Primary   Relevant Medications   medroxyPROGESTERone (DEPO-PROVERA) injection 150 mg (Completed)   Other Relevant Orders   POCT urine pregnancy (Completed)   Rash of hand     Continue treatment per ER recommendaiton   Irregular menstrual cycle       Breakthrough bleeding on Depo-Provera     Instructed patient that this was due to not being consistent with treatment regimen       Meds ordered this encounter  Medications  . medroxyPROGESTERone (DEPO-PROVERA) injection 150 mg    Follow-up: Return in about 1 year (around 12/01/2021) for Q 3 mon DEPO.    Vevelyn Francois, NP

## 2020-12-03 DIAGNOSIS — Z79899 Other long term (current) drug therapy: Secondary | ICD-10-CM | POA: Diagnosis not present

## 2020-12-03 DIAGNOSIS — R21 Rash and other nonspecific skin eruption: Secondary | ICD-10-CM | POA: Diagnosis not present

## 2020-12-03 DIAGNOSIS — M7660 Achilles tendinitis, unspecified leg: Secondary | ICD-10-CM | POA: Diagnosis not present

## 2020-12-03 DIAGNOSIS — D860 Sarcoidosis of lung: Secondary | ICD-10-CM | POA: Diagnosis not present

## 2020-12-05 DIAGNOSIS — M7751 Other enthesopathy of right foot: Secondary | ICD-10-CM | POA: Diagnosis not present

## 2020-12-05 DIAGNOSIS — M7660 Achilles tendinitis, unspecified leg: Secondary | ICD-10-CM | POA: Diagnosis not present

## 2020-12-05 DIAGNOSIS — D860 Sarcoidosis of lung: Secondary | ICD-10-CM | POA: Diagnosis not present

## 2020-12-05 DIAGNOSIS — M899 Disorder of bone, unspecified: Secondary | ICD-10-CM | POA: Diagnosis not present

## 2020-12-06 ENCOUNTER — Encounter: Payer: Self-pay | Admitting: Nurse Practitioner

## 2020-12-11 DIAGNOSIS — M545 Low back pain, unspecified: Secondary | ICD-10-CM | POA: Diagnosis not present

## 2020-12-11 DIAGNOSIS — G8929 Other chronic pain: Secondary | ICD-10-CM | POA: Diagnosis not present

## 2020-12-11 DIAGNOSIS — M79671 Pain in right foot: Secondary | ICD-10-CM | POA: Diagnosis not present

## 2020-12-11 DIAGNOSIS — Z79891 Long term (current) use of opiate analgesic: Secondary | ICD-10-CM | POA: Diagnosis not present

## 2020-12-11 DIAGNOSIS — G894 Chronic pain syndrome: Secondary | ICD-10-CM | POA: Diagnosis not present

## 2020-12-11 DIAGNOSIS — M25551 Pain in right hip: Secondary | ICD-10-CM | POA: Diagnosis not present

## 2020-12-11 DIAGNOSIS — M25562 Pain in left knee: Secondary | ICD-10-CM | POA: Diagnosis not present

## 2020-12-11 DIAGNOSIS — M5431 Sciatica, right side: Secondary | ICD-10-CM | POA: Diagnosis not present

## 2020-12-13 DIAGNOSIS — D860 Sarcoidosis of lung: Secondary | ICD-10-CM | POA: Diagnosis not present

## 2020-12-15 DIAGNOSIS — H52223 Regular astigmatism, bilateral: Secondary | ICD-10-CM | POA: Diagnosis not present

## 2020-12-15 DIAGNOSIS — H524 Presbyopia: Secondary | ICD-10-CM | POA: Diagnosis not present

## 2020-12-18 ENCOUNTER — Encounter: Payer: Self-pay | Admitting: Nurse Practitioner

## 2020-12-18 ENCOUNTER — Telehealth (INDEPENDENT_AMBULATORY_CARE_PROVIDER_SITE_OTHER): Payer: Medicare Other | Admitting: Nurse Practitioner

## 2020-12-18 DIAGNOSIS — R21 Rash and other nonspecific skin eruption: Secondary | ICD-10-CM | POA: Diagnosis not present

## 2020-12-18 MED ORDER — CLOTRIMAZOLE-BETAMETHASONE 1-0.05 % EX CREA: 1.0000 "application " | TOPICAL_CREAM | Freq: Two times a day (BID) | CUTANEOUS | 0 refills | Status: DC

## 2020-12-18 MED ORDER — DIPHENHYDRAMINE HCL 50 MG PO TABS: 50.0000 mg | ORAL_TABLET | Freq: Every evening | ORAL | 0 refills | Status: DC | PRN

## 2020-12-18 NOTE — Progress Notes (Signed)
Virtual Visit via Telephone Note  I connected with Rhonda Hester on 12/18/20 at  2:00 PM EST by telephone and verified that I am speaking with the correct person using two identifiers.   I discussed the limitations, risks, security and privacy concerns of performing an evaluation and management service by telephone and the availability of in person appointments. I also discussed with the patient that there may be a patient responsible charge related to this service. The patient expressed understanding and agreed to proceed.  Patient home Provider Office  History of Present Illness:  Rash Patient presents for evaluation of a rash involving the hand right . Rash started 3 weeks ago. Lesions are red and purple, and raised in texture. Rash has changed over time. Rash is painful and is pruritic. The burning and itching is worse at night. Associated symptoms: none. Patient denies: abdominal pain, arthralgia, congestion, cough, decrease in appetite, decrease in energy level, fever, headache, irritability, myalgia, nausea, sore throat and vomiting. Patient has not had contacts with similar rash. Patient has not had new exposures (soaps, lotions, laundry detergents, foods, medications, plants, insects or animals).  Observations/Objective: No exam virtual visit  Assessment and Plan: Assessment  Primary Diagnosis & Pertinent Problem List: The encounter diagnosis was Rash of hand.  Visit Diagnosis: 1. Rash of hand  Change in topical hydrocortisone cream We will trial Chlortrimazole and betamethasone Encouraged the use of Benadryl to assist with the pruritus Dermatology referral placed. Patient has seen dermatology at [facility] however would prefer a provider in the area    Follow Up Instructions: Follow-up appointment as scheduled   I discussed the assessment and treatment plan with the patient. The patient was provided an opportunity to ask questions and all were answered. The patient agreed  with the plan and demonstrated an understanding of the instructions.   The patient was advised to call back or seek an in-person evaluation if the symptoms worsen or if the condition fails to improve as anticipated.  I provided 8 minutes of non-face-to-face time during this encounter.   Vevelyn Francois, NP

## 2020-12-18 NOTE — Patient Instructions (Signed)

## 2020-12-18 NOTE — Progress Notes (Signed)
R hand pain worse, itchy w/ redness/purple discoloration and skin peeling, prescribed cream not working, causes burning

## 2021-01-05 ENCOUNTER — Other Ambulatory Visit: Payer: Self-pay | Admitting: Nurse Practitioner

## 2021-01-05 ENCOUNTER — Telehealth: Payer: Self-pay | Admitting: Nurse Practitioner

## 2021-01-05 DIAGNOSIS — R21 Rash and other nonspecific skin eruption: Secondary | ICD-10-CM

## 2021-01-05 NOTE — Telephone Encounter (Signed)
Referral placed.

## 2021-01-08 DIAGNOSIS — G8929 Other chronic pain: Secondary | ICD-10-CM | POA: Diagnosis not present

## 2021-01-08 DIAGNOSIS — R519 Headache, unspecified: Secondary | ICD-10-CM | POA: Diagnosis not present

## 2021-01-08 DIAGNOSIS — M79671 Pain in right foot: Secondary | ICD-10-CM | POA: Diagnosis not present

## 2021-01-08 DIAGNOSIS — M545 Low back pain, unspecified: Secondary | ICD-10-CM | POA: Diagnosis not present

## 2021-01-08 DIAGNOSIS — M25562 Pain in left knee: Secondary | ICD-10-CM | POA: Diagnosis not present

## 2021-01-08 DIAGNOSIS — G894 Chronic pain syndrome: Secondary | ICD-10-CM | POA: Diagnosis not present

## 2021-01-08 DIAGNOSIS — M5431 Sciatica, right side: Secondary | ICD-10-CM | POA: Diagnosis not present

## 2021-01-08 DIAGNOSIS — M25551 Pain in right hip: Secondary | ICD-10-CM | POA: Diagnosis not present

## 2021-01-08 DIAGNOSIS — Z79891 Long term (current) use of opiate analgesic: Secondary | ICD-10-CM | POA: Diagnosis not present

## 2021-01-13 DIAGNOSIS — D860 Sarcoidosis of lung: Secondary | ICD-10-CM | POA: Diagnosis not present

## 2021-01-20 DIAGNOSIS — D860 Sarcoidosis of lung: Secondary | ICD-10-CM | POA: Diagnosis not present

## 2021-01-20 DIAGNOSIS — R21 Rash and other nonspecific skin eruption: Secondary | ICD-10-CM | POA: Diagnosis not present

## 2021-01-20 DIAGNOSIS — M25571 Pain in right ankle and joints of right foot: Secondary | ICD-10-CM | POA: Diagnosis not present

## 2021-01-20 DIAGNOSIS — M7660 Achilles tendinitis, unspecified leg: Secondary | ICD-10-CM | POA: Diagnosis not present

## 2021-01-20 DIAGNOSIS — Z79899 Other long term (current) drug therapy: Secondary | ICD-10-CM | POA: Diagnosis not present

## 2021-01-20 DIAGNOSIS — G8929 Other chronic pain: Secondary | ICD-10-CM | POA: Diagnosis not present

## 2021-01-29 DIAGNOSIS — D860 Sarcoidosis of lung: Secondary | ICD-10-CM | POA: Diagnosis not present

## 2021-01-29 DIAGNOSIS — L219 Seborrheic dermatitis, unspecified: Secondary | ICD-10-CM | POA: Diagnosis not present

## 2021-01-29 DIAGNOSIS — L710 Perioral dermatitis: Secondary | ICD-10-CM | POA: Diagnosis not present

## 2021-01-29 DIAGNOSIS — L309 Dermatitis, unspecified: Secondary | ICD-10-CM | POA: Diagnosis not present

## 2021-01-30 DIAGNOSIS — Z79899 Other long term (current) drug therapy: Secondary | ICD-10-CM | POA: Diagnosis not present

## 2021-01-30 DIAGNOSIS — D860 Sarcoidosis of lung: Secondary | ICD-10-CM | POA: Diagnosis not present

## 2021-02-05 DIAGNOSIS — R519 Headache, unspecified: Secondary | ICD-10-CM | POA: Diagnosis not present

## 2021-02-05 DIAGNOSIS — M25562 Pain in left knee: Secondary | ICD-10-CM | POA: Diagnosis not present

## 2021-02-05 DIAGNOSIS — D869 Sarcoidosis, unspecified: Secondary | ICD-10-CM | POA: Diagnosis not present

## 2021-02-05 DIAGNOSIS — M79671 Pain in right foot: Secondary | ICD-10-CM | POA: Diagnosis not present

## 2021-02-05 DIAGNOSIS — M25551 Pain in right hip: Secondary | ICD-10-CM | POA: Diagnosis not present

## 2021-02-05 DIAGNOSIS — Z79891 Long term (current) use of opiate analgesic: Secondary | ICD-10-CM | POA: Diagnosis not present

## 2021-02-05 DIAGNOSIS — G8929 Other chronic pain: Secondary | ICD-10-CM | POA: Diagnosis not present

## 2021-02-05 DIAGNOSIS — G890 Central pain syndrome: Secondary | ICD-10-CM | POA: Diagnosis not present

## 2021-02-05 DIAGNOSIS — M5431 Sciatica, right side: Secondary | ICD-10-CM | POA: Diagnosis not present

## 2021-02-05 DIAGNOSIS — M545 Low back pain, unspecified: Secondary | ICD-10-CM | POA: Diagnosis not present

## 2021-02-05 DIAGNOSIS — G894 Chronic pain syndrome: Secondary | ICD-10-CM | POA: Diagnosis not present

## 2021-02-11 ENCOUNTER — Other Ambulatory Visit: Payer: Self-pay

## 2021-02-11 ENCOUNTER — Ambulatory Visit (INDEPENDENT_AMBULATORY_CARE_PROVIDER_SITE_OTHER): Payer: Medicare Other | Admitting: Dermatology

## 2021-02-11 DIAGNOSIS — Z840 Family history of diseases of the skin and subcutaneous tissue: Secondary | ICD-10-CM | POA: Diagnosis not present

## 2021-02-11 DIAGNOSIS — L219 Seborrheic dermatitis, unspecified: Secondary | ICD-10-CM

## 2021-02-11 DIAGNOSIS — L309 Dermatitis, unspecified: Secondary | ICD-10-CM

## 2021-02-11 MED ORDER — CLOBETASOL PROPIONATE 0.05 % EX CREA: TOPICAL_CREAM | CUTANEOUS | 1 refills | Status: DC

## 2021-02-11 MED ORDER — KETOCONAZOLE 2 % EX SHAM: MEDICATED_SHAMPOO | CUTANEOUS | 2 refills | Status: DC

## 2021-02-11 NOTE — Progress Notes (Signed)
New Patient Visit  Subjective  Rhonda Hester is a 45 y.o. female who presents for the following: Skin Problem.  Patient here today for a second opinoin regarding the rash on hands, feet. She was treated at [facility] for Seborrheic dermatitis of the scalp (Derma-Smoothe F/S oil), Hand Dermatitis (clobetasol ointment), and Periorificial Dermatitis (metronidazole 0.75% cream). She started treatment on 01/29/21, but hasn't improved much. She is mainly concerned about her hands and feet, which gives her pain and itch. She also has sarcoidosis of the lung. On Remicade infusions q3wks for at least a year and methotrexate 7.5 mg qwk. Patient's father with a history of psoriasis.  The following portions of the chart were reviewed this encounter and updated as appropriate:       Review of Systems:  No other skin or systemic complaints except as noted in HPI or Assessment and Plan.  Objective  Well appearing patient in no apparent distress; mood and affect are within normal limits.  A focused examination was performed including face, scalp, hands, feet. Relevant physical exam findings are noted in the Assessment and Plan.  Objective  Scalp: Mild scaling of the scalp.  Objective  hands, feet: Erythema of palms with hyperkeratosis of lateral palms and focal scaling; hyperpigmented scaly plaques with erythema L medial heel and plantar foot, R heel; web spaces are clear.   Assessment & Plan  Seborrheic dermatitis Scalp  vs Psoriasis, has improved per patient  Continue Derma-Smoothe F/S oil qd and leave on.  Start ketoconazole 2% shampoo Massage into scalp with hair washing (q 2 weeks) and let sit 10 minutess before rinsing. If flaring, increase hair washing to weekly.  ketoconazole (NIZORAL) 2 % shampoo - Scalp  Dermatitis hands, feet  Hand/Foot Dermatitis vs Palmar/plantar psoriasis (family h/o psoriasis in father)  Not improving with topical Clobetasol (using x  2 weeks) ointment.  Doesn't like ointment- too greasy.  Recommend longer treatment course to assess efficacy Consider increase MTX per Dr Raechel Ache, rheumatologist at [facility]. Take folic acid daily except on day of MTX. Patient will discuss at her follow-up appointment.  Higher dose MTX (12.5-15 mg qwk) can be helpful in treating both dermatitis and psoriasis.  Switch to clobetasol cream qd/bid AAs hands and feet until improved. Avoid face, groin, axilla. Apply cotton glove and sock to hands/feet at night.   Avoid picking since this can worsen problem. Minimize hand washing. Use mild soap with washing followed by moisturizing cream, CeraVe Hydrating Cleanser and CeraVe Psoriasis cream (with SA) samples given.  May also apply after clobetasol cream.  Discussed psoriasis can be a rare paradoxical side effect of Remicade, but this is less likely to be cause of her hand/foot rash since not having other areas of psoriasis come up on body.  Topical steroids (such as triamcinolone, fluocinolone, fluocinonide, mometasone, clobetasol, halobetasol, betamethasone, hydrocortisone) can cause thinning and lightening of the skin if they are used for too long in the same area. Your physician has selected the right strength medicine for your problem and area affected on the body. Please use your medication only as directed by your physician to prevent side effects.   If not improved on f/up, consider adding in [place] treatment.  Could also consider adding Otezla.     clobetasol cream (TEMOVATE) 0.05 % - hands, feet  Return in about 1 month (around 03/11/2021) for f/u hand/foot dermatitis/psoriasis.  Lindi Adie, CMA, am acting as scribe for Brendolyn Patty, MD .  Documentation:  I have reviewed the above documentation for accuracy and completeness, and I agree with the above.  Brendolyn Patty MD

## 2021-02-11 NOTE — Patient Instructions (Addendum)
Start ketoconazole 2% shampoo - massage into scalp every 2 weeks, let sit 10 minutes, then rinse.  Clobetasol cream - Apply to affected areas hands and feet 1-2 times a day until rash improved. At night, apply clobetasol cream followed by CeraVe Psoriasis and glove/sock. Avoid face, groin, underarms.  Recommend mild soap and moisturizing cream 1-2 times daily.   Take Folic Acid every day except the day you take methotrexate.

## 2021-02-13 DIAGNOSIS — D860 Sarcoidosis of lung: Secondary | ICD-10-CM | POA: Diagnosis not present

## 2021-02-16 ENCOUNTER — Other Ambulatory Visit: Payer: Self-pay

## 2021-02-16 ENCOUNTER — Ambulatory Visit (INDEPENDENT_AMBULATORY_CARE_PROVIDER_SITE_OTHER): Payer: Medicare Other | Admitting: Nurse Practitioner

## 2021-02-16 DIAGNOSIS — N926 Irregular menstruation, unspecified: Secondary | ICD-10-CM | POA: Diagnosis not present

## 2021-02-16 DIAGNOSIS — Z3042 Encounter for surveillance of injectable contraceptive: Secondary | ICD-10-CM

## 2021-02-16 MED ORDER — MEDROXYPROGESTERONE ACETATE 150 MG/ML IM SUSP
150.0000 mg | Freq: Once | INTRAMUSCULAR | Status: AC
  Administered 2021-02-16: 150 mg via INTRAMUSCULAR

## 2021-02-16 MED ORDER — MEDROXYPROGESTERONE ACETATE 150 MG/ML IM SUSY: 150.0000 mg | PREFILLED_SYRINGE | INTRAMUSCULAR | 3 refills | Status: DC

## 2021-02-16 NOTE — Progress Notes (Signed)
Date last pap: 09/07/2019. Last Depo-Provera: 12/01/20. Side Effects if any: None. Serum HCG indicated? No, on time/within window Depo-Provera 150 mg IM given by: Juventino Slovak, RN. R ventrogluteal Next appointment due 5/6-5/23. Pt tolerated well w/o adverse effects noted.

## 2021-03-05 DIAGNOSIS — M79671 Pain in right foot: Secondary | ICD-10-CM | POA: Diagnosis not present

## 2021-03-05 DIAGNOSIS — Z79891 Long term (current) use of opiate analgesic: Secondary | ICD-10-CM | POA: Diagnosis not present

## 2021-03-05 DIAGNOSIS — M25562 Pain in left knee: Secondary | ICD-10-CM | POA: Diagnosis not present

## 2021-03-05 DIAGNOSIS — G8929 Other chronic pain: Secondary | ICD-10-CM | POA: Diagnosis not present

## 2021-03-05 DIAGNOSIS — G890 Central pain syndrome: Secondary | ICD-10-CM | POA: Diagnosis not present

## 2021-03-05 DIAGNOSIS — D869 Sarcoidosis, unspecified: Secondary | ICD-10-CM | POA: Diagnosis not present

## 2021-03-05 DIAGNOSIS — G894 Chronic pain syndrome: Secondary | ICD-10-CM | POA: Diagnosis not present

## 2021-03-05 DIAGNOSIS — M5431 Sciatica, right side: Secondary | ICD-10-CM | POA: Diagnosis not present

## 2021-03-05 DIAGNOSIS — M545 Low back pain, unspecified: Secondary | ICD-10-CM | POA: Diagnosis not present

## 2021-03-05 DIAGNOSIS — M25551 Pain in right hip: Secondary | ICD-10-CM | POA: Diagnosis not present

## 2021-03-05 DIAGNOSIS — R519 Headache, unspecified: Secondary | ICD-10-CM | POA: Diagnosis not present

## 2021-03-11 ENCOUNTER — Other Ambulatory Visit: Payer: Self-pay

## 2021-03-11 ENCOUNTER — Ambulatory Visit (INDEPENDENT_AMBULATORY_CARE_PROVIDER_SITE_OTHER): Payer: Medicare Other | Admitting: Dermatology

## 2021-03-11 DIAGNOSIS — L309 Dermatitis, unspecified: Secondary | ICD-10-CM

## 2021-03-11 DIAGNOSIS — L409 Psoriasis, unspecified: Secondary | ICD-10-CM

## 2021-03-11 DIAGNOSIS — L219 Seborrheic dermatitis, unspecified: Secondary | ICD-10-CM

## 2021-03-11 DIAGNOSIS — R21 Rash and other nonspecific skin eruption: Secondary | ICD-10-CM

## 2021-03-11 MED ORDER — DUOBRII 0.01-0.045 % EX LOTN: 1.0000 "application " | TOPICAL_LOTION | Freq: Every day | CUTANEOUS | 3 refills | Status: DC

## 2021-03-11 MED ORDER — OTEZLA 30 MG PO TABS: 30.0000 mg | ORAL_TABLET | Freq: Two times a day (BID) | ORAL | 3 refills | Status: DC

## 2021-03-11 MED ORDER — OTEZLA 30 MG PO TABS: 30.0000 mg | ORAL_TABLET | Freq: Two times a day (BID) | ORAL | 12 refills | Status: DC

## 2021-03-11 MED ORDER — CLOBETASOL PROPIONATE 0.05 % EX CREA: TOPICAL_CREAM | CUTANEOUS | 1 refills | Status: DC

## 2021-03-11 MED ORDER — KETOCONAZOLE 2 % EX SHAM: 1.0000 "application " | MEDICATED_SHAMPOO | CUTANEOUS | 2 refills | Status: DC

## 2021-03-11 MED ORDER — DUOBRII 0.01-0.045 % EX LOTN: 1.0000 "application " | TOPICAL_LOTION | Freq: Two times a day (BID) | CUTANEOUS | 2 refills | Status: DC

## 2021-03-11 MED ORDER — KETOCONAZOLE 2 % EX SHAM: MEDICATED_SHAMPOO | CUTANEOUS | 2 refills | Status: DC

## 2021-03-11 MED ORDER — CLOBETASOL PROPIONATE 0.05 % EX CREA: 1.0000 "application " | TOPICAL_CREAM | Freq: Two times a day (BID) | CUTANEOUS | 2 refills | Status: DC

## 2021-03-11 NOTE — Progress Notes (Deleted)
   Follow-Up Visit   Subjective  Rhonda Hester is a 45 y.o. female who presents for the following: Follow-up (Hand/foot derm vs palmar/plantar psoriasis - Clobetasol cream, MTX from rheumatologist. Also following up on seb derm vs psoriasis of scalp treating with Derma-Smoothe and Ketoconazole shampoo).   The following portions of the chart were reviewed this encounter and updated as appropriate:       Review of Systems:  No other skin or systemic complaints except as noted in HPI or Assessment and Plan.  Objective  Well appearing patient in no apparent distress; mood and affect are within normal limits.  A focused examination was performed including hands, feet. Relevant physical exam findings are noted in the Assessment and Plan.  Objective  Hands and feet: Erythematous scaly patches of bilateral palms. Hyperpigmented scaly patches of left plantar foot x 2 and left post heel. Scaling of scalp.  BSA - 4%   Assessment & Plan   Return in about 1 month (around 04/11/2021) for Psoriasis.

## 2021-03-11 NOTE — Progress Notes (Deleted)
   Follow-Up Visit   Subjective  Rhonda Hester is a 45 y.o. female who presents for the following: Follow-up (Hand/foot derm vs palmar/plantar psoriasis - Clobetasol cream, MTX from rheumatologist. Also following up on seb derm vs psoriasis of scalp treating with Derma-Smoothe and Ketoconazole shampoo).   The following portions of the chart were reviewed this encounter and updated as appropriate:       Review of Systems:  No other skin or systemic complaints except as noted in HPI or Assessment and Plan.  Objective  Well appearing patient in no apparent distress; mood and affect are within normal limits.  A focused examination was performed including scalp, hands, feet. Relevant physical exam findings are noted in the Assessment and Plan.  Objective  Hands and feet: Erythematous scaly patches of bilateral palms. Hyperpigmented scaly patches of left plantar foot x 2 and left post heel. Scaling of scalp.  BSA - 4%   Assessment & Plan  Psoriasis Hands and feet  Palmar/Plantar Psoriasis vs Hand/foot Dermatitis -   Discussed Xtrac treatments. Will file for prior approval for treatments.  Start Otezla 30 mg 1 po qd - sample pack given and patient advised to titrate up to 30 mg daily until follow up. May increase to bid on follow up if tolerating well.  Continue MTX as prescribed by rheumatologist.  Continue Clobetasol cream bid  Continue Ketoconazole 2% shampoo and Derma-Smoothe oil to scalp  Start Duobrii lotion bid  - samples given  Apremilast (OTEZLA) 30 MG TABS - Hands and feet  Apremilast (OTEZLA) 30 MG TABS - Hands and feet  Halobetasol Prop-Tazarotene (DUOBRII) 0.01-0.045 % LOTN - Hands and feet  Seborrheic dermatitis  Dermatitis  Return in about 1 month (around 04/11/2021) for Psoriasis.

## 2021-03-11 NOTE — Progress Notes (Deleted)
   Follow-Up Visit   Subjective  Rhonda Hester is a 45 y.o. female who presents for the following: Follow-up (Hand/foot derm vs palmar/plantar psoriasis - Clobetasol cream, MTX from rheumatologist. Also following up on seb derm vs psoriasis of scalp treating with Derma-Smoothe and Ketoconazole shampoo).  ***  The following portions of the chart were reviewed this encounter and updated as appropriate:       Review of Systems:  No other skin or systemic complaints except as noted in HPI or Assessment and Plan.  Objective  Well appearing patient in no apparent distress; mood and affect are within normal limits.  {[id number]::"I full examination was performed including scalp, head, eyes, ears, nose, lips, neck, chest, axillae, abdomen, back, buttocks, bilateral upper extremities, bilateral lower extremities, hands, feet, fingers, toes, fingernails, and toenails. All findings within normal limits unless otherwise noted below."}  Objective  Hands and feet: Erythematous scaly patches of bilateral palms. Hyperpigmented scaly patches of left plantar foot x 2 and left post heel.    Assessment & Plan    Sarcoidosis  She is being treated with Remicade.   Psoriasis Hands and feet   No follow-ups on file.

## 2021-03-11 NOTE — Progress Notes (Signed)
   Follow-Up Visit   Subjective  Rhonda Hester is a 45 y.o. female who presents for the following: Follow-up (Hand/foot derm vs palmar/plantar psoriasis - Clobetasol cream, MTX from rheumatologist. Also following up on seb derm vs psoriasis of scalp treating with Derma-Smoothe and Ketoconazole shampoo).  Not improving with topical treatment.  Taking 7.5 mg mtx qwk.    The following portions of the chart were reviewed this encounter and updated as appropriate:       Review of Systems:  No other skin or systemic complaints except as noted in HPI or Assessment and Plan.  Objective  Well appearing patient in no apparent distress; mood and affect are within normal limits.  A focused examination was performed including scalp, hands, feet. Relevant physical exam findings are noted in the Assessment and Plan.  Objective  Hands and feet: Erythematous scaly patches of bilateral palms. Hyperpigmented scaly patches of left plantar foot x 2 and left post heel. Scaling of scalp.  BSA - 4%   Assessment & Plan  Psoriasis Hands and feet  Palmar/Plantar Psoriasis vs Hand/foot Dermatitis - no change Also scalp- improving Discussed Xtrac treatments twice weekly to hands/feet. Will file for prior approval for treatments.  Start Otezla 30 mg 1 po qd - sample pack given and patient advised to titrate up to 30 mg daily until follow up. May increase to bid on follow up if tolerating well.  Continue MTX as prescribed by rheumatologist.  Continue Clobetasol cream bid  Continue Ketoconazole 2% shampoo and Derma-Smoothe oil to scalp  Start Duobrii lotion qhs to aas hands/feet  - samples given  Apremilast (OTEZLA) 30 MG TABS - Hands and feet  Apremilast (OTEZLA) 30 MG TABS - Hands and feet  Halobetasol Prop-Tazarotene (DUOBRII) 0.01-0.045 % LOTN - Hands and feet  clobetasol cream (TEMOVATE) 0.05 % - Hands and feet  Reordered Medications ketoconazole (NIZORAL) 2 % shampoo  Seborrheic  dermatitis  Dermatitis  Return in about 1 month (around 04/11/2021) for Psoriasis.   I, Ashok Cordia, CMA, am acting as scribe for Brendolyn Patty, MD .  Documentation: I have reviewed the above documentation for accuracy and completeness, and I agree with the above.  Brendolyn Patty MD

## 2021-03-12 MED ORDER — DIPHENHYDRAMINE HCL 50 MG PO TABS: 50.0000 mg | ORAL_TABLET | Freq: Every evening | ORAL | 0 refills | Status: DC | PRN

## 2021-03-12 MED ORDER — CLOTRIMAZOLE-BETAMETHASONE 1-0.05 % EX CREA: 1.0000 "application " | TOPICAL_CREAM | Freq: Two times a day (BID) | CUTANEOUS | 0 refills | Status: DC

## 2021-03-13 DIAGNOSIS — D860 Sarcoidosis of lung: Secondary | ICD-10-CM | POA: Diagnosis not present

## 2021-03-16 ENCOUNTER — Telehealth: Payer: Self-pay

## 2021-03-16 NOTE — Telephone Encounter (Signed)
Send in Halobetasol cream and Tazarotene cream, both apply qhs to hands/feet.

## 2021-03-16 NOTE — Telephone Encounter (Signed)
Duobrii denied by insurance. Pt must try and fail 2 of the following options -calcipotriene cream -combination of halobetasol cream and tazarotene cream as separate products -tazarotene cream (*would need a PA)

## 2021-03-17 ENCOUNTER — Other Ambulatory Visit: Payer: Self-pay

## 2021-03-17 DIAGNOSIS — L409 Psoriasis, unspecified: Secondary | ICD-10-CM

## 2021-03-17 MED ORDER — HALOBETASOL PROPIONATE 0.05 % EX CREA: TOPICAL_CREAM | CUTANEOUS | 2 refills | Status: DC

## 2021-03-17 MED ORDER — TAZAROTENE 0.1 % EX CREA: TOPICAL_CREAM | CUTANEOUS | 2 refills | Status: DC

## 2021-03-27 DIAGNOSIS — D860 Sarcoidosis of lung: Secondary | ICD-10-CM | POA: Diagnosis not present

## 2021-04-02 DIAGNOSIS — M25562 Pain in left knee: Secondary | ICD-10-CM | POA: Diagnosis not present

## 2021-04-02 DIAGNOSIS — M5431 Sciatica, right side: Secondary | ICD-10-CM | POA: Diagnosis not present

## 2021-04-02 DIAGNOSIS — M79671 Pain in right foot: Secondary | ICD-10-CM | POA: Diagnosis not present

## 2021-04-02 DIAGNOSIS — M25551 Pain in right hip: Secondary | ICD-10-CM | POA: Diagnosis not present

## 2021-04-02 DIAGNOSIS — M545 Low back pain, unspecified: Secondary | ICD-10-CM | POA: Diagnosis not present

## 2021-04-02 DIAGNOSIS — R519 Headache, unspecified: Secondary | ICD-10-CM | POA: Diagnosis not present

## 2021-04-02 DIAGNOSIS — D869 Sarcoidosis, unspecified: Secondary | ICD-10-CM | POA: Diagnosis not present

## 2021-04-02 DIAGNOSIS — G894 Chronic pain syndrome: Secondary | ICD-10-CM | POA: Diagnosis not present

## 2021-04-13 DIAGNOSIS — D860 Sarcoidosis of lung: Secondary | ICD-10-CM | POA: Diagnosis not present

## 2021-04-14 DIAGNOSIS — D869 Sarcoidosis, unspecified: Secondary | ICD-10-CM | POA: Diagnosis not present

## 2021-04-14 DIAGNOSIS — M25562 Pain in left knee: Secondary | ICD-10-CM | POA: Diagnosis not present

## 2021-04-14 DIAGNOSIS — R519 Headache, unspecified: Secondary | ICD-10-CM | POA: Diagnosis not present

## 2021-04-14 DIAGNOSIS — M545 Low back pain, unspecified: Secondary | ICD-10-CM | POA: Diagnosis not present

## 2021-04-14 DIAGNOSIS — M25551 Pain in right hip: Secondary | ICD-10-CM | POA: Diagnosis not present

## 2021-04-14 DIAGNOSIS — Z79891 Long term (current) use of opiate analgesic: Secondary | ICD-10-CM | POA: Diagnosis not present

## 2021-04-14 DIAGNOSIS — G8929 Other chronic pain: Secondary | ICD-10-CM | POA: Diagnosis not present

## 2021-04-14 DIAGNOSIS — M79671 Pain in right foot: Secondary | ICD-10-CM | POA: Diagnosis not present

## 2021-04-14 DIAGNOSIS — G890 Central pain syndrome: Secondary | ICD-10-CM | POA: Diagnosis not present

## 2021-04-14 DIAGNOSIS — G894 Chronic pain syndrome: Secondary | ICD-10-CM | POA: Diagnosis not present

## 2021-04-14 DIAGNOSIS — M5431 Sciatica, right side: Secondary | ICD-10-CM | POA: Diagnosis not present

## 2021-04-15 ENCOUNTER — Other Ambulatory Visit: Payer: Self-pay

## 2021-04-15 ENCOUNTER — Ambulatory Visit (INDEPENDENT_AMBULATORY_CARE_PROVIDER_SITE_OTHER): Payer: Medicare Other | Admitting: Dermatology

## 2021-04-15 ENCOUNTER — Encounter: Payer: Self-pay | Admitting: Dermatology

## 2021-04-15 DIAGNOSIS — L700 Acne vulgaris: Secondary | ICD-10-CM

## 2021-04-15 DIAGNOSIS — L409 Psoriasis, unspecified: Secondary | ICD-10-CM | POA: Diagnosis not present

## 2021-04-15 DIAGNOSIS — L810 Postinflammatory hyperpigmentation: Secondary | ICD-10-CM | POA: Diagnosis not present

## 2021-04-15 MED ORDER — DUOBRII 0.01-0.045 % EX LOTN: TOPICAL_LOTION | CUTANEOUS | 2 refills | Status: DC

## 2021-04-15 MED ORDER — ADAPALENE 0.3 % EX GEL: CUTANEOUS | 2 refills | Status: DC

## 2021-04-15 MED ORDER — CLINDAMYCIN PHOSPHATE 1 % EX LOTN: TOPICAL_LOTION | CUTANEOUS | 2 refills | Status: DC

## 2021-04-15 NOTE — Patient Instructions (Addendum)
Apply Duobrii at night to hands and feet.  Apply Halobetasol in morning to hands and feet until clear.   Stop Tazarotene when receive Duobrii.  Topical retinoid medications like tretinoin/Retin-A, adapalene/Differin, tazarotene/Fabior, and Epiduo/Epiduo Forte can cause dryness and irritation when first started. Only apply a pea-sized amount to the entire affected area. Avoid applying it around the eyes, edges of mouth and creases at the nose. If you experience irritation, use a good moisturizer first and/or apply the medicine less often. If you are doing well with the medicine, you can increase how often you use it until you are applying every night. Be careful with sun protection while using this medication as it can make you sensitive to the sun. This medicine should not be used by pregnant women.   Topical steroids (such as triamcinolone, fluocinolone, fluocinonide, mometasone, clobetasol, halobetasol, betamethasone, hydrocortisone) can cause thinning and lightening of the skin if they are used for too long in the same area. Your physician has selected the right strength medicine for your problem and area affected on the body. Please use your medication only as directed by your physician to prevent side effects.   If you have any questions or concerns for your doctor, please call our main line at [phone] and press option 4 to reach your doctor's medical assistant. If no one answers, please leave a voicemail as directed and we will return your call as soon as possible. Messages left after 4 pm will be answered the following business day.   You may also send [place] a message via [place]. We typically respond to MyChart messages within 1-2 business days.  For prescription refills, please ask your pharmacy to contact our office. Our fax number is [phone].  If you have an urgent issue when the clinic is closed that cannot wait until the next business day, you can page your doctor at the number  below.    Please note that while we do our best to be available for urgent issues outside of office hours, we are not available 24/7.   If you have an urgent issue and are unable to reach [place], you may choose to seek medical care at your doctor's office, retail clinic, urgent care center, or emergency room.  If you have a medical emergency, please immediately call 911 or go to the emergency department.  Pager Numbers  - Dr. Nehemiah Massed: [phone]  - Dr. Laurence Ferrari: [phone]  - Dr. Nicole Kindred: [phone]  In the event of inclement weather, please call our main line at [phone] for an update on the status of any delays or closures.  Dermatology Medication Tips: Please keep the boxes that topical medications come in in order to help keep track of the instructions about where and how to use these. Pharmacies typically print the medication instructions only on the boxes and not directly on the medication tubes.   If your medication is too expensive, please contact our office at [phone] option 4 or send [place] a message through [place].   We are unable to tell what your co-pay for medications will be in advance as this is different depending on your insurance coverage. However, we may be able to find a substitute medication at lower cost or fill out paperwork to get insurance to cover a needed medication.   If a prior authorization is required to get your medication covered by your insurance company, please allow [place] 1-2 business days to complete this process.  Drug prices often vary depending on where the  prescription is filled and some pharmacies may offer cheaper prices.  The website www.goodrx.com contains coupons for medications through different pharmacies. The prices here do not account for what the cost may be with help from insurance (it may be cheaper with your insurance), but the website can give you the price if you did not use any insurance.  - You can print the associated coupon  and take it with your prescription to the pharmacy.  - You may also stop by our office during regular business hours and pick up a GoodRx coupon card.  - If you need your prescription sent electronically to a different pharmacy, notify our office through [facility] or by phone at [phone] option 4.

## 2021-04-15 NOTE — Progress Notes (Signed)
   Follow-Up Visit   Subjective  Rhonda Hester is a 45 y.o. female who presents for the following: Psoriasis (Recheck palms, soles and scalp. Using Clobetasol cream bid, using Ketoconazole 2% shampoo and Derma-Smoothe oil to scalp. Used Duobrii lotion qhs to aas hands/feet, used samples given. Rx was not covered. Halobetasol and Tazarotene were prescribed. Thinks this is related to Remicaide infusion. Was improving but flared after last infusion 04/02/2021. Infusions every 8 weeks. Did not tolerate Otezla. Caused diarrhea after a few pills. Patient C/O vision changes. Unsure if related to Remicade infusion.).  She has pulmonary sarcoid and she says the Remicade does seem to help her breathing.    The following portions of the chart were reviewed this encounter and updated as appropriate:      Review of Systems: No other skin or systemic complaints except as noted in HPI or Assessment and Plan.  Objective  Well appearing patient in no apparent distress; mood and affect are within normal limits.  A focused examination was performed including face, hands, feet. Relevant physical exam findings are noted in the Assessment and Plan.  Objective  Medial Plantar feet, palms, chest, scalp: Well demarcated hyperpigmented patches at bilateral medial plantar feet. Hyperpigmented patch intermammary chest. Erythema with hyperkeratosis and dried microvesicles at palms.   Objective  Head - Anterior (Face): Hyperpigmented papules at face  Assessment & Plan  Psoriasis Medial Plantar feet, palms, chest, scalp  Palmarplantar- with recent flare post Remicade infusion for sarcoid.  Unable to tolerate Otezla due to GI side effects  Psoriasis is potential paradoxical side effect of Remicade  She will continue to observe for flares after Remicade infusions q 8 wks to see if related.  May need to apply potent topical steroid to palms and soles prior to infusion and post infusion.   Start Duobrii lotion qhs  to palms/soles Continue clobetasol cream qd in morning Continue 2% ketoconazole shampoo once weekly Continue Dermasmoothe Scalp oil qd/bid  Also recommend discussing with rheumatologist increasing MTX dosage to 12.5-15 mg weekly.   Will look into insurance coverage for XTRAC laser  Halobetasol Prop-Tazarotene (DUOBRII) 0.01-0.045 % LOTN - Medial Plantar feet, palms, chest, scalp  Other Related Medications clobetasol cream (TEMOVATE) 0.05 % ketoconazole (NIZORAL) 2 % shampoo halobetasol (ULTRAVATE) 0.05 % cream tazarotene (AVAGE) 0.1 % cream  Acne vulgaris Head - Anterior (Face)  With PIH  Start Clindamycin lotion QAM Start Adapalene 0.3% gel QHS as tolerated  Topical retinoid medications like tretinoin/Retin-A, adapalene/Differin, tazarotene/Fabior, and Epiduo/Epiduo Forte can cause dryness and irritation when first started. Only apply a pea-sized amount to the entire affected area. Avoid applying it around the eyes, edges of mouth and creases at the nose. If you experience irritation, use a good moisturizer first and/or apply the medicine less often. If you are doing well with the medicine, you can increase how often you use it until you are applying every night. Be careful with sun protection while using this medication as it can make you sensitive to the sun. This medicine should not be used by pregnant women.    clindamycin (CLEOCIN-T) 1 % lotion - Head - Anterior (Face)  Adapalene (DIFFERIN) 0.3 % gel - Head - Anterior (Face)  Return in about 2 months (around 06/15/2021) for psoriasis,acne.   I, Emelia Salisbury, CMA, am acting as scribe for Brendolyn Patty, MD.  Documentation: I have reviewed the above documentation for accuracy and completeness, and I agree with the above.  Brendolyn Patty MD

## 2021-04-22 DIAGNOSIS — L409 Psoriasis, unspecified: Secondary | ICD-10-CM | POA: Diagnosis not present

## 2021-04-22 DIAGNOSIS — H539 Unspecified visual disturbance: Secondary | ICD-10-CM | POA: Diagnosis not present

## 2021-04-22 DIAGNOSIS — R209 Unspecified disturbances of skin sensation: Secondary | ICD-10-CM | POA: Diagnosis not present

## 2021-04-22 DIAGNOSIS — D860 Sarcoidosis of lung: Secondary | ICD-10-CM | POA: Diagnosis not present

## 2021-04-22 DIAGNOSIS — Z79899 Other long term (current) drug therapy: Secondary | ICD-10-CM | POA: Diagnosis not present

## 2021-04-22 DIAGNOSIS — Z1159 Encounter for screening for other viral diseases: Secondary | ICD-10-CM | POA: Diagnosis not present

## 2021-04-24 ENCOUNTER — Telehealth: Payer: Self-pay

## 2021-04-24 NOTE — Telephone Encounter (Signed)
Called patient last month after appointment to go over [place] benefits. Phone does not ring and goes straight to voicemail.  Called patient today and left 2nd voicemail to return my call.

## 2021-04-29 DIAGNOSIS — K760 Fatty (change of) liver, not elsewhere classified: Secondary | ICD-10-CM | POA: Diagnosis not present

## 2021-04-29 DIAGNOSIS — D8689 Sarcoidosis of other sites: Secondary | ICD-10-CM | POA: Diagnosis not present

## 2021-05-04 ENCOUNTER — Other Ambulatory Visit: Payer: Self-pay

## 2021-05-04 ENCOUNTER — Encounter: Payer: Self-pay | Admitting: Nurse Practitioner

## 2021-05-04 ENCOUNTER — Ambulatory Visit (INDEPENDENT_AMBULATORY_CARE_PROVIDER_SITE_OTHER): Payer: Medicare Other | Admitting: Nurse Practitioner

## 2021-05-04 VITALS — BP 125/84 | HR 88 | Ht 67.0 in | Wt 234.0 lb

## 2021-05-04 DIAGNOSIS — F331 Major depressive disorder, recurrent, moderate: Secondary | ICD-10-CM | POA: Diagnosis not present

## 2021-05-04 DIAGNOSIS — E669 Obesity, unspecified: Secondary | ICD-10-CM | POA: Diagnosis not present

## 2021-05-04 DIAGNOSIS — R6883 Chills (without fever): Secondary | ICD-10-CM

## 2021-05-04 DIAGNOSIS — Z13220 Encounter for screening for lipoid disorders: Secondary | ICD-10-CM | POA: Diagnosis not present

## 2021-05-04 DIAGNOSIS — Z3042 Encounter for surveillance of injectable contraceptive: Secondary | ICD-10-CM

## 2021-05-04 DIAGNOSIS — D860 Sarcoidosis of lung: Secondary | ICD-10-CM | POA: Diagnosis not present

## 2021-05-04 MED ORDER — MEDROXYPROGESTERONE ACETATE 150 MG/ML IM SUSP
150.0000 mg | Freq: Once | INTRAMUSCULAR | Status: AC
  Administered 2021-05-04: 150 mg via INTRAMUSCULAR

## 2021-05-04 NOTE — Patient Instructions (Signed)
Healthy Eating Following a healthy eating pattern may help you to achieve and maintain a healthy body weight, reduce the risk of chronic disease, and live a long and productive life. It is important to follow a healthy eating pattern at an appropriate calorie level for your body. Your nutritional needs should be met primarily through food by choosing a variety of nutrient-rich foods. What are tips for following this plan? Reading food labels  Read labels and choose the following: ? Reduced or low sodium. ? Juices with 100% fruit juice. ? Foods with low saturated fats and high polyunsaturated and monounsaturated fats. ? Foods with whole grains, such as whole wheat, cracked wheat, brown rice, and wild rice. ? Whole grains that are fortified with folic acid. This is recommended for women who are pregnant or who want to become pregnant.  Read labels and avoid the following: ? Foods with a lot of added sugars. These include foods that contain brown sugar, corn sweetener, corn syrup, dextrose, fructose, glucose, high-fructose corn syrup, honey, invert sugar, lactose, malt syrup, maltose, molasses, raw sugar, sucrose, trehalose, or turbinado sugar.  Do not eat more than the following amounts of added sugar per day:  6 teaspoons (25 g) for women.  9 teaspoons (38 g) for men. ? Foods that contain processed or refined starches and grains. ? Refined grain products, such as white flour, degermed cornmeal, white bread, and white rice. Shopping  Choose nutrient-rich snacks, such as vegetables, whole fruits, and nuts. Avoid high-calorie and high-sugar snacks, such as potato chips, fruit snacks, and candy.  Use oil-based dressings and spreads on foods instead of solid fats such as butter, stick margarine, or cream cheese.  Limit pre-made sauces, mixes, and "instant" products such as flavored rice, instant noodles, and ready-made pasta.  Try more plant-protein sources, such as tofu, tempeh, black beans,  edamame, lentils, nuts, and seeds.  Explore eating plans such as the Mediterranean diet or vegetarian diet. Cooking  Use oil to saut or stir-fry foods instead of solid fats such as butter, stick margarine, or lard.  Try baking, boiling, grilling, or broiling instead of frying.  Remove the fatty part of meats before cooking.  Steam vegetables in water or broth. Meal planning  At meals, imagine dividing your plate into fourths: ? One-half of your plate is fruits and vegetables. ? One-fourth of your plate is whole grains. ? One-fourth of your plate is protein, especially lean meats, poultry, eggs, tofu, beans, or nuts.  Include low-fat dairy as part of your daily diet.   Lifestyle  Choose healthy options in all settings, including home, work, school, restaurants, or stores.  Prepare your food safely: ? Wash your hands after handling raw meats. ? Keep food preparation surfaces clean by regularly washing with hot, soapy water. ? Keep raw meats separate from ready-to-eat foods, such as fruits and vegetables. ? Cook seafood, meat, poultry, and eggs to the recommended internal temperature. ? Store foods at safe temperatures. In general:  Keep cold foods at 7F (4.4C) or below.  Keep hot foods at 17F (60C) or above.  Keep your freezer at [facility] (-17.8C) or below.  Foods are no longer safe to eat when they have been between the temperatures of 40-17F (4.4-60C) for more than 2 hours. What foods should I eat? Fruits Aim to eat 2 cup-equivalents of fresh, canned (in natural juice), or frozen fruits each day. Examples of 1 cup-equivalent of fruit include 1 small apple, 8 large strawberries, 1 cup canned fruit,  cup dried fruit, or 1 cup 100% juice. Vegetables Aim to eat 2-3 cup-equivalents of fresh and frozen vegetables each day, including different varieties and colors. Examples of 1 cup-equivalent of vegetables include 2 medium carrots, 2 cups raw, leafy greens, 1 cup chopped  vegetable (raw or cooked), or 1 medium baked potato. Grains Aim to eat 6 ounce-equivalents of whole grains each day. Examples of 1 ounce-equivalent of grains include 1 slice of bread, 1 cup ready-to-eat cereal, 3 cups popcorn, or  cup cooked rice, pasta, or cereal. Meats and other proteins Aim to eat 5-6 ounce-equivalents of protein each day. Examples of 1 ounce-equivalent of protein include 1 egg, 1/2 cup nuts or seeds, or 1 tablespoon (16 g) peanut butter. A cut of meat or fish that is the size of a deck of cards is about 3-4 ounce-equivalents.  Of the protein you eat each week, try to have at least 8 ounces come from seafood. This includes salmon, trout, herring, and anchovies. Dairy Aim to eat 3 cup-equivalents of fat-free or low-fat dairy each day. Examples of 1 cup-equivalent of dairy include 1 cup (240 mL) milk, 8 ounces (250 g) yogurt, 1 ounces (44 g) natural cheese, or 1 cup (240 mL) fortified soy milk. Fats and oils  Aim for about 5 teaspoons (21 g) per day. Choose monounsaturated fats, such as canola and olive oils, avocados, peanut butter, and most nuts, or polyunsaturated fats, such as sunflower, corn, and soybean oils, walnuts, pine nuts, sesame seeds, sunflower seeds, and flaxseed. Beverages  Aim for six 8-oz glasses of water per day. Limit coffee to three to five 8-oz cups per day.  Limit caffeinated beverages that have added calories, such as soda and energy drinks.  Limit alcohol intake to no more than 1 drink a day for nonpregnant women and 2 drinks a day for men. One drink equals 12 oz of beer (355 mL), 5 oz of wine (148 mL), or 1 oz of hard liquor (44 mL). Seasoning and other foods  Avoid adding excess amounts of salt to your foods. Try flavoring foods with herbs and spices instead of salt.  Avoid adding sugar to foods.  Try using oil-based dressings, sauces, and spreads instead of solid fats. This information is based on general [place] nutrition guidelines. For more  information, visit choosemyplate.gov. Exact amounts may vary based on your nutrition needs. Summary  A healthy eating plan may help you to maintain a healthy weight, reduce the risk of chronic diseases, and stay active throughout your life.  Plan your meals. Make sure you eat the right portions of a variety of nutrient-rich foods.  Try baking, boiling, grilling, or broiling instead of frying.  Choose healthy options in all settings, including home, work, school, restaurants, or stores. This information is not intended to replace advice given to you by your health care provider. Make sure you discuss any questions you have with your health care provider. Document Revised: 03/27/2018 Document Reviewed: 03/27/2018 Elsevier Patient Education  2021 [employer]  

## 2021-05-04 NOTE — Progress Notes (Signed)
[address] [place], [place]  [address] Phone:  [phone]   Fax:  [phone]   Established Patient Office Visit  Subjective:  Patient ID: Rhonda Hester, female    DOB: 1976-08-02  Age: 45 y.o. MRN: [id number]  CC:  Chief Complaint  Patient presents with  . Follow-up    Depo shot    HPI Rhonda Hester presents for follow up. She  has a past medical history of Allergic asthma, Allergy, Anxiety, Breast mass (05/2017), Eczema, Fibroid, GERD (gastroesophageal reflux disease), History of anal fissures, History of Bell's palsy, History of gastroesophageal reflux (GERD), History of kidney stones, Hyperlipidemia, Interstitial cystitis, Sarcoidosis of lung ([place]), Seasonal allergic rhinitis, Shortness of breath, and Vaginal Pap smear, abnormal.   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   She is here to receive Depo-Provera contraception.   She is upset that she had to not be told that her medication could cause psoriasis. She is going to have start a new medication that she will  have to inject at home. She is concern about this also. When asked she admits that her father does have psoriasis. She is concern that this has spread from her hands to her feet. She does not like this. She is also concern why everything that she has is not curable.    Past Medical History:  Diagnosis Date  . Allergic asthma   . Allergy    SEASONAL  . Anxiety   . Breast mass 05/2017   lt breast lump  . Eczema   . Fibroid   . GERD (gastroesophageal reflux disease)   . History of anal fissures   . History of Bell's palsy    2006  RIGHT SIDE--  RESOLVED  . History of gastroesophageal reflux (GERD)   . History of kidney stones   . Hyperlipidemia   . Interstitial cystitis   . Sarcoidosis of lung ([place])    DX 2006--   PULMOLOGIST--  DR DGLO- on 2 liters of oxygen, goes to pain clinic  . Seasonal allergic rhinitis   . Shortness of breath   . Vaginal Pap smear, abnormal     Past Surgical History:  Procedure Laterality Date  . CYSTO WITH HYDRODISTENSION N/A 08/24/2013   Procedure: CYSTOSCOPY/HYDRODISTENSION with instillation of marcaine and pyridium;  Surgeon: Fredricka Bonine, MD;  Location: [facility];  Service: Urology;  Laterality: N/A;  . CYSTOSCOPY/ HYDRODISTENTION/ BLADDER BX  06-09-2010  . CYSTOSCOPY/URETEROSCOPY/HOLMIUM LASER/STENT PLACEMENT Right 01/30/2019   Procedure: CYSTOSCOPY/RETROGRADE/URETEROSCOPY/HOLMIUM LASER/STENT PLACEMENT;  Surgeon: Festus Aloe, MD;  Location: [facility];  Service: Urology;  Laterality: Right;  . DILATION AND CURETTAGE OF UTERUS  1997  . EXTRACORPOREAL SHOCK WAVE LITHOTRIPSY Right 11/30/2018   Procedure: EXTRACORPOREAL SHOCK WAVE LITHOTRIPSY (ESWL);  Surgeon: Bjorn Loser, MD;  Location: [facility];  Service: Urology;  Laterality: Right;  . IR TRANSCATHETER BX  06/14/2018  . IR US GUIDE VASC ACCESS RIGHT  06/14/2018  . IR VENOGRAM HEPATIC W HEMODYNAMIC EVALUATION  06/14/2018    Family History  Problem Relation Age of Onset  . Brain cancer Maternal Grandmother   . Hyperlipidemia Other   . Sleep apnea Other   . Depression Maternal Aunt   . Seizures Maternal Uncle   . Colon cancer Neg Hx   . Stomach cancer Neg Hx     Social  History   Socioeconomic History  . Marital status: Single    Spouse name: Not on file  . Number of children: 0  . Years of education: Not on file  . Highest education level: Not on file  Occupational History  . Occupation: disabled  Tobacco Use  . Smoking status: Former Smoker    Packs/day: 0.30    Years: 6.00    Pack years: 1.80    Types: Cigarettes, Cigars    Quit date: 11/2016    Years since quitting: 4.4  . Smokeless tobacco: Never Used  . Tobacco comment: ONE CIG. DAILY /  1 PPMONTH  Vaping Use  .  Vaping Use: Never used  Substance and Sexual Activity  . Alcohol use: No    Alcohol/week: 0.0 standard drinks  . Drug use: No    Comment: HX MARIJUANA USE  . Sexual activity: Not Currently    Birth control/protection: None    Comment: abstinence   Other Topics Concern  . Not on file  Social History Narrative  . Not on file   Social Determinants of Health   Financial Resource Strain: Not on file  Food Insecurity: Not on file  Transportation Needs: Not on file  Physical Activity: Not on file  Stress: Not on file  Social Connections: Not on file  Intimate Partner Violence: Not on file    Outpatient Medications Prior to Visit  Medication Sig Dispense Refill  . Adalimumab (HUMIRA PEN) 40 MG/0.4ML PNKT Inject into the skin.    Marland Kitchen albuterol (PROAIR HFA) 108 (90 Base) MCG/ACT inhaler Inhale 2 puffs into the lungs every 6 (six) hours as needed for shortness of breath. 1 Inhaler 5  . Cetirizine HCl 10 MG CAPS Take 1 capsule (10 mg total) by mouth daily. 20 capsule 0  . clindamycin (CLEOCIN-T) 1 % lotion Apply QAM to face, wash off QHS 60 mL 2  . clobetasol cream (TEMOVATE) 8.84 % Apply 1 application topically 2 (two) times daily. 60 g 2  . clotrimazole-betamethasone (LOTRISONE) cream Apply 1 application topically 2 (two) times daily. 30 g 0  . DULERA 200-5 MCG/ACT AERO Inhale 2 puffs into the lungs 2 (two) times daily.    . Fluocinolone Acetonide Body 0.01 % OIL Apply topically.    . folic acid (FOLVITE) 1 MG tablet Take by mouth.    . halobetasol (ULTRAVATE) 0.05 % cream Apply every night to hands and feet 50 g 2  . ibuprofen (ADVIL) 200 MG tablet Take 600-800 mg by mouth every 6 (six) hours as needed for moderate pain.    Marland Kitchen ketoconazole (NIZORAL) 2 % shampoo Apply 1 application topically 2 (two) times a week. 120 mL 2  . medroxyPROGESTERone Acetate 150 MG/ML SUSY Inject 1 mL (150 mg total) into the muscle every 3 (three) months. 0.9 mL 3  . methotrexate (RHEUMATREX) 2.5 MG tablet Take  2.5 mg by mouth every Wednesday. Caution: Chemotherapy. Protect from light.    . metroNIDAZOLE (METROCREAM) 0.75 % cream Apply topically 2 (two) times daily.    . naloxone (NARCAN) nasal spray 4 mg/0.1 mL OPIOID EMERGENCY: 1 SPRAY INTO ONE NOSTRIL , MAY REPEAT 2-3 MIN UNTIL RESPONSIVE OR EMS ARRIVES.    Marland Kitchen Oxycodone HCl 10 MG TABS Take 10 mg by mouth every 4 (four) hours as needed (pain).    . Sulfacetamide Sodium-Sulfur 10-5 % SUSP Apply topically 2 (two) times daily.    . tazarotene (AVAGE) 0.1 % cream Apply every night to hands and feet 60  g 2  . tiZANidine (ZANAFLEX) 4 MG tablet Take 4 mg by mouth 3 (three) times daily.    . Adapalene (DIFFERIN) 0.3 % gel QHS to face, wash off QAM (Patient not taking: Reported on 05/04/2021) 45 g 2  . diphenhydrAMINE (BENADRYL) 50 MG tablet Take 1 tablet (50 mg total) by mouth at bedtime as needed for itching. (Patient not taking: Reported on 05/04/2021) 30 tablet 0  . Halobetasol Prop-Tazarotene (DUOBRII) 0.01-0.045 % LOTN Apply to palms and soles at bedtime. (Patient not taking: Reported on 05/04/2021) 100 g 2  . inFLIXimab (REMICADE) 100 MG injection Inject into the vein. (Patient not taking: Reported on 05/04/2021)    . OTEZLA 30 MG TABS Take 1 tablet by mouth 2 (two) times daily.     Facility-Administered Medications Prior to Visit  Medication Dose Route Frequency Provider Last Rate Last Admin  . bupivacaine (MARCAINE) 0.5 % 15 mL, phenazopyridine (PYRIDIUM) 400 mg bladder mixture   Bladder Instillation Once Festus Aloe, MD        Allergies  Allergen Reactions  . Acyclovir And Related Other (See Comments)    Patient states it "made me feel like I can't breath"   . Citric Acid Other (See Comments)    AVOIDS DUE TO INTERSTITIAL CYSTITIS  . Dust Mite Extract Itching and Other (See Comments)     coughing  . Grapeseed Extract [Nutritional Supplements] Other (See Comments)    Throat itching and burning  . Pineapple Itching    Burning in mouth and  tingling lips  . Pollen Extract Itching and Other (See Comments)     coughing  . Nickel Rash    ROS Review of Systems  Skin: Positive for rash (psoriasis on her hands and left feet).      Objective:    Physical Exam Constitutional:      Appearance: She is obese.  HENT:     Head: Normocephalic and atraumatic.  Cardiovascular:     Rate and Rhythm: Normal rate and regular rhythm.     Pulses: Normal pulses.     Heart sounds: Normal heart sounds.  Pulmonary:     Effort: Pulmonary effort is normal.     Breath sounds: Normal breath sounds.  Abdominal:     Palpations: Abdomen is soft.  Musculoskeletal:     Cervical back: Normal range of motion.     Right lower leg: No edema.     Left lower leg: No edema.  Skin:    General: Skin is warm.     Capillary Refill: Capillary refill takes less than 2 seconds.     Findings: Rash present.  Neurological:     General: No focal deficit present.     Mental Status: She is alert and oriented to person, place, and time.  Psychiatric:        Mood and Affect: Mood normal.        Behavior: Behavior normal.        Thought Content: Thought content normal.        Judgment: Judgment normal.     BP 125/84 (BP Location: Right Arm)   Pulse 88   Ht 5\' 7"  (1.702 m)   Wt 234 lb 0.6 oz (106.2 kg)   SpO2 100%   BMI 36.66 kg/m  Wt Readings from Last 3 Encounters:  05/04/21 234 lb 0.6 oz (106.2 kg)  12/01/20 227 lb (103 kg)  11/30/20 230 lb (104.3 kg)     Health Maintenance Due  Topic Date  Due  . COVID-19 Vaccine (1) Never done    There are no preventive care reminders to display for this patient.  Lab Results  Component Value Date   TSH 1.120 05/05/2020   Lab Results  Component Value Date   WBC 5.3 01/30/2019   HGB 11.2 (L) 01/30/2019   HCT 33.0 (L) 01/30/2019   MCV 90.4 01/30/2019   PLT 267 01/30/2019   Lab Results  Component Value Date   NA 141 05/05/2020   K 3.6 05/05/2020   CO2 25 11/11/2018   GLUCOSE 139 (H)  05/05/2020   BUN 10 05/05/2020   CREATININE 0.71 05/05/2020   BILITOT 0.3 05/05/2020   ALKPHOS 94 05/05/2020   AST 39 05/05/2020   ALT 45 (H) 11/11/2018   PROT 7.0 05/05/2020   ALBUMIN 4.1 05/05/2020   CALCIUM 9.3 05/05/2020   ANIONGAP 9 11/11/2018   GFR 138.73 04/25/2018   Lab Results  Component Value Date   CHOL 199 05/05/2020   Lab Results  Component Value Date   HDL 57 05/05/2020   Lab Results  Component Value Date   LDLCALC 122 (H) 05/05/2020   Lab Results  Component Value Date   TRIG 110 05/05/2020   Lab Results  Component Value Date   CHOLHDL 3.5 05/05/2020   Lab Results  Component Value Date   HGBA1C 4.9 05/10/2018      Assessment & Plan:   Problem List Items Addressed This Visit      Respiratory   Sarcoidosis of lung ([place]) Stable      Other   Moderate episode of recurrent major depressive disorder (H[facility]) Worsening however declines counseling at this time    Other Visit Diagnoses    Encounter for Depo-Provera contraception    -  Primary Completed    Relevant Medications   medroxyPROGESTERone (DEPO-PROVERA) injection 150 mg (Completed)   Other Relevant Orders   POCT urine pregnancy   Obesity (BMI 35.0-39.9 without comorbidity)     Persistent Education provided   Chills (without fever)     Complaint of evaluation pending   Relevant Orders   TSH   Screening for cholesterol level       Relevant Orders   Lipid panel      Meds ordered this encounter  Medications  . medroxyPROGESTERone (DEPO-PROVERA) injection 150 mg    Follow-up: Return in about 1 year (around 05/04/2022) for q 3 mnths for Depo-Provera contraception.    Vevelyn Francois, NP

## 2021-05-05 LAB — LIPID PANEL
Chol/HDL Ratio: 3.8 ratio (ref 0.0–4.4)
Cholesterol, Total: 205 mg/dL — ABNORMAL HIGH (ref 100–199)
HDL: 54 mg/dL (ref >39)
LDL Chol Calc (NIH): 130 mg/dL — ABNORMAL HIGH (ref 0–99)
Triglycerides: 118 mg/dL (ref 0–149)
VLDL Cholesterol Cal: 21 mg/dL (ref 5–40)

## 2021-05-05 LAB — TSH: TSH: 2.33 u[IU]/mL (ref 0.450–4.500)

## 2021-05-06 LAB — POCT URINE PREGNANCY: Preg Test, Ur: NEGATIVE

## 2021-05-12 DIAGNOSIS — L401 Generalized pustular psoriasis: Secondary | ICD-10-CM | POA: Diagnosis not present

## 2021-05-12 DIAGNOSIS — D860 Sarcoidosis of lung: Secondary | ICD-10-CM | POA: Diagnosis not present

## 2021-05-13 DIAGNOSIS — D860 Sarcoidosis of lung: Secondary | ICD-10-CM | POA: Diagnosis not present

## 2021-05-15 DIAGNOSIS — M25551 Pain in right hip: Secondary | ICD-10-CM | POA: Diagnosis not present

## 2021-05-15 DIAGNOSIS — G8929 Other chronic pain: Secondary | ICD-10-CM | POA: Diagnosis not present

## 2021-05-15 DIAGNOSIS — G890 Central pain syndrome: Secondary | ICD-10-CM | POA: Diagnosis not present

## 2021-05-15 DIAGNOSIS — D869 Sarcoidosis, unspecified: Secondary | ICD-10-CM | POA: Diagnosis not present

## 2021-05-15 DIAGNOSIS — M545 Low back pain, unspecified: Secondary | ICD-10-CM | POA: Diagnosis not present

## 2021-05-15 DIAGNOSIS — G894 Chronic pain syndrome: Secondary | ICD-10-CM | POA: Diagnosis not present

## 2021-05-15 DIAGNOSIS — R519 Headache, unspecified: Secondary | ICD-10-CM | POA: Diagnosis not present

## 2021-05-15 DIAGNOSIS — M5431 Sciatica, right side: Secondary | ICD-10-CM | POA: Diagnosis not present

## 2021-05-15 DIAGNOSIS — Z79891 Long term (current) use of opiate analgesic: Secondary | ICD-10-CM | POA: Diagnosis not present

## 2021-05-15 DIAGNOSIS — M79671 Pain in right foot: Secondary | ICD-10-CM | POA: Diagnosis not present

## 2021-05-15 DIAGNOSIS — M25562 Pain in left knee: Secondary | ICD-10-CM | POA: Diagnosis not present

## 2021-06-04 DIAGNOSIS — L403 Pustulosis palmaris et plantaris: Secondary | ICD-10-CM | POA: Diagnosis not present

## 2021-06-04 DIAGNOSIS — R200 Anesthesia of skin: Secondary | ICD-10-CM | POA: Diagnosis not present

## 2021-06-04 DIAGNOSIS — D860 Sarcoidosis of lung: Secondary | ICD-10-CM | POA: Diagnosis not present

## 2021-06-04 DIAGNOSIS — Z79899 Other long term (current) drug therapy: Secondary | ICD-10-CM | POA: Diagnosis not present

## 2021-06-04 DIAGNOSIS — H04123 Dry eye syndrome of bilateral lacrimal glands: Secondary | ICD-10-CM | POA: Diagnosis not present

## 2021-06-04 DIAGNOSIS — R202 Paresthesia of skin: Secondary | ICD-10-CM | POA: Diagnosis not present

## 2021-06-04 DIAGNOSIS — H43823 Vitreomacular adhesion, bilateral: Secondary | ICD-10-CM | POA: Diagnosis not present

## 2021-06-04 DIAGNOSIS — H26012 Infantile and juvenile cortical, lamellar, or zonular cataract, left eye: Secondary | ICD-10-CM | POA: Diagnosis not present

## 2021-06-04 DIAGNOSIS — D869 Sarcoidosis, unspecified: Secondary | ICD-10-CM | POA: Diagnosis not present

## 2021-06-12 ENCOUNTER — Encounter: Payer: Self-pay | Admitting: Nurse Practitioner

## 2021-06-12 ENCOUNTER — Telehealth (INDEPENDENT_AMBULATORY_CARE_PROVIDER_SITE_OTHER): Payer: Medicare Other | Admitting: Nurse Practitioner

## 2021-06-12 ENCOUNTER — Other Ambulatory Visit: Payer: Self-pay

## 2021-06-12 DIAGNOSIS — R102 Pelvic and perineal pain: Secondary | ICD-10-CM | POA: Diagnosis not present

## 2021-06-12 DIAGNOSIS — N6320 Unspecified lump in the left breast, unspecified quadrant: Secondary | ICD-10-CM | POA: Diagnosis not present

## 2021-06-12 DIAGNOSIS — G8929 Other chronic pain: Secondary | ICD-10-CM | POA: Diagnosis not present

## 2021-06-12 DIAGNOSIS — R519 Headache, unspecified: Secondary | ICD-10-CM | POA: Diagnosis not present

## 2021-06-12 DIAGNOSIS — M545 Low back pain, unspecified: Secondary | ICD-10-CM | POA: Diagnosis not present

## 2021-06-12 DIAGNOSIS — M79671 Pain in right foot: Secondary | ICD-10-CM | POA: Diagnosis not present

## 2021-06-12 DIAGNOSIS — G894 Chronic pain syndrome: Secondary | ICD-10-CM | POA: Diagnosis not present

## 2021-06-12 DIAGNOSIS — M25551 Pain in right hip: Secondary | ICD-10-CM | POA: Diagnosis not present

## 2021-06-12 DIAGNOSIS — D869 Sarcoidosis, unspecified: Secondary | ICD-10-CM | POA: Diagnosis not present

## 2021-06-12 DIAGNOSIS — H9211 Otorrhea, right ear: Secondary | ICD-10-CM | POA: Diagnosis not present

## 2021-06-12 DIAGNOSIS — M25562 Pain in left knee: Secondary | ICD-10-CM | POA: Diagnosis not present

## 2021-06-12 DIAGNOSIS — G890 Central pain syndrome: Secondary | ICD-10-CM | POA: Diagnosis not present

## 2021-06-12 DIAGNOSIS — M5431 Sciatica, right side: Secondary | ICD-10-CM | POA: Diagnosis not present

## 2021-06-12 DIAGNOSIS — Z79891 Long term (current) use of opiate analgesic: Secondary | ICD-10-CM | POA: Diagnosis not present

## 2021-06-12 MED ORDER — CIPROFLOXACIN-HYDROCORTISONE 0.2-1 % OT SUSP: 3.0000 [drp] | Freq: Two times a day (BID) | OTIC | 0 refills | Status: AC

## 2021-06-12 NOTE — Progress Notes (Signed)
   [name], [place]  [address] Phone:  [phone]   Fax:  [phone] Virtual Visit via Video Note  I connected with Rhonda Hester on 06/12/21 at  3:00 PM EDT by video and verified that I am speaking with the correct person using two identifiers.   I discussed the limitations, risks, security and privacy concerns of performing an evaluation and management service by video and the availability of in person appointments. I also discussed with the patient that there may be a patient responsible charge related to this service. The patient expressed understanding and agreed to proceed.  Patient home Provider Office  History of Present Illness:   Patient complains of right ear odor. Symptoms have been present a few days. She also notes  ear wetness . She does not have a history of ear infections. She does not have a history of recent swimming. She has tried no medications  for her symptoms. She has not been recently treated with .  She has had a left breast pain. She reports that her previous test have been negative. However fibrous tissue was seen .  She has been having increased pelvic pain the last few months.   Observations/Objective: Virtual visit   Assessment and Plan: Assessment  Primary Diagnosis & Pertinent Problem List: The primary encounter diagnosis was Ear discharge of right ear. Diagnoses of Pelvic pain and Left breast lump were also pertinent to this visit.  Visit Diagnosis: 1. Ear discharge of right ear  Persistent Cipro HC drops  2. Pelvic pain  Persistent would like evaluation for fibroids  3. Left breast lump  Persistent would like reevaluation  Familiar history breat cancer grandmother      Follow Up Instructions: FU AS   I discussed the assessment and treatment plan with the patient. The patient was provided an opportunity to ask questions and all were answered. The patient agreed with the plan and  demonstrated an understanding of the instructions.   The patient was advised to call back or seek an in-person evaluation if the symptoms worsen or if the condition fails to improve as anticipated.  I provided 10 minutes of video- visit time during this encounter.   Vevelyn Francois, NP

## 2021-06-13 DIAGNOSIS — D860 Sarcoidosis of lung: Secondary | ICD-10-CM | POA: Diagnosis not present

## 2021-06-15 DIAGNOSIS — R5383 Other fatigue: Secondary | ICD-10-CM | POA: Diagnosis not present

## 2021-06-15 DIAGNOSIS — E559 Vitamin D deficiency, unspecified: Secondary | ICD-10-CM | POA: Diagnosis not present

## 2021-06-17 ENCOUNTER — Other Ambulatory Visit: Payer: Self-pay | Admitting: Nurse Practitioner

## 2021-06-17 DIAGNOSIS — N6320 Unspecified lump in the left breast, unspecified quadrant: Secondary | ICD-10-CM

## 2021-06-18 DIAGNOSIS — R519 Headache, unspecified: Secondary | ICD-10-CM | POA: Diagnosis not present

## 2021-06-18 DIAGNOSIS — G890 Central pain syndrome: Secondary | ICD-10-CM | POA: Diagnosis not present

## 2021-06-18 DIAGNOSIS — M25551 Pain in right hip: Secondary | ICD-10-CM | POA: Diagnosis not present

## 2021-06-18 DIAGNOSIS — M79671 Pain in right foot: Secondary | ICD-10-CM | POA: Diagnosis not present

## 2021-06-18 DIAGNOSIS — M5431 Sciatica, right side: Secondary | ICD-10-CM | POA: Diagnosis not present

## 2021-06-18 DIAGNOSIS — M25562 Pain in left knee: Secondary | ICD-10-CM | POA: Diagnosis not present

## 2021-06-18 DIAGNOSIS — M545 Low back pain, unspecified: Secondary | ICD-10-CM | POA: Diagnosis not present

## 2021-06-18 DIAGNOSIS — D869 Sarcoidosis, unspecified: Secondary | ICD-10-CM | POA: Diagnosis not present

## 2021-06-18 DIAGNOSIS — L409 Psoriasis, unspecified: Secondary | ICD-10-CM | POA: Diagnosis not present

## 2021-06-22 ENCOUNTER — Other Ambulatory Visit: Payer: Self-pay | Admitting: Nurse Practitioner

## 2021-06-22 ENCOUNTER — Telehealth: Payer: Self-pay

## 2021-06-22 MED ORDER — NEOMYCIN-POLYMYXIN-HC 3.5-10000-1 OT SOLN: 3.0000 [drp] | Freq: Four times a day (QID) | OTIC | 0 refills | Status: DC

## 2021-06-22 NOTE — Progress Notes (Signed)
    [facility] [address] [place], [place]  [address] Phone:  [phone]   Fax:  [phone] 

## 2021-06-24 NOTE — Telephone Encounter (Signed)
Error

## 2021-07-01 ENCOUNTER — Encounter (HOSPITAL_COMMUNITY): Payer: Self-pay

## 2021-07-01 ENCOUNTER — Emergency Department (HOSPITAL_COMMUNITY): Payer: Medicare Other

## 2021-07-01 ENCOUNTER — Other Ambulatory Visit: Payer: Self-pay

## 2021-07-01 ENCOUNTER — Ambulatory Visit (INDEPENDENT_AMBULATORY_CARE_PROVIDER_SITE_OTHER)
Admission: EM | Admit: 2021-07-01 | Discharge: 2021-07-01 | Disposition: A | Discharge status: Home / Self Care | Payer: Medicare Other | Source: Home / Self Care

## 2021-07-01 ENCOUNTER — Emergency Department (HOSPITAL_COMMUNITY)
Admission: EM | Admit: 2021-07-01 | Discharge: 2021-07-01 | Disposition: A | Discharge status: Home / Self Care | Payer: Medicare Other | Attending: Emergency Medicine | Admitting: Emergency Medicine

## 2021-07-01 DIAGNOSIS — K219 Gastro-esophageal reflux disease without esophagitis: Secondary | ICD-10-CM | POA: Diagnosis not present

## 2021-07-01 DIAGNOSIS — Z87891 Personal history of nicotine dependence: Secondary | ICD-10-CM | POA: Diagnosis not present

## 2021-07-01 DIAGNOSIS — R102 Pelvic and perineal pain: Secondary | ICD-10-CM | POA: Diagnosis not present

## 2021-07-01 DIAGNOSIS — R1032 Left lower quadrant pain: Secondary | ICD-10-CM | POA: Insufficient documentation

## 2021-07-01 DIAGNOSIS — N938 Other specified abnormal uterine and vaginal bleeding: Secondary | ICD-10-CM | POA: Diagnosis not present

## 2021-07-01 DIAGNOSIS — D259 Leiomyoma of uterus, unspecified: Secondary | ICD-10-CM | POA: Insufficient documentation

## 2021-07-01 DIAGNOSIS — N939 Abnormal uterine and vaginal bleeding, unspecified: Secondary | ICD-10-CM | POA: Diagnosis present

## 2021-07-01 DIAGNOSIS — J45909 Unspecified asthma, uncomplicated: Secondary | ICD-10-CM | POA: Diagnosis not present

## 2021-07-01 DIAGNOSIS — D219 Benign neoplasm of connective and other soft tissue, unspecified: Secondary | ICD-10-CM

## 2021-07-01 HISTORY — DX: Psoriasis, unspecified: L40.9

## 2021-07-01 LAB — COMPREHENSIVE METABOLIC PANEL
ALT: 22 U/L (ref 0–44)
AST: 21 U/L (ref 15–41)
Albumin: 4.5 g/dL (ref 3.5–5.0)
Alkaline Phosphatase: 67 U/L (ref 38–126)
Anion gap: 10 (ref 5–15)
BUN: 7 mg/dL (ref 6–20)
CO2: 22 mmol/L (ref 22–32)
Calcium: 9.6 mg/dL (ref 8.9–10.3)
Chloride: 107 mmol/L (ref 98–111)
Creatinine, Ser: 0.48 mg/dL (ref 0.44–1.00)
GFR, Estimated: >60 mL/min (ref >60)
Glucose, Bld: 95 mg/dL (ref 70–99)
Potassium: 3.5 mmol/L (ref 3.5–5.1)
Sodium: 139 mmol/L (ref 135–145)
Total Bilirubin: 1.5 mg/dL — ABNORMAL HIGH (ref 0.3–1.2)
Total Protein: 8.6 g/dL — ABNORMAL HIGH (ref 6.5–8.1)

## 2021-07-01 LAB — URINALYSIS, ROUTINE W REFLEX MICROSCOPIC
Bacteria, UA: NONE SEEN
Bilirubin Urine: NEGATIVE
Glucose, UA: NEGATIVE mg/dL
Ketones, ur: NEGATIVE mg/dL
Leukocytes,Ua: NEGATIVE
Nitrite: NEGATIVE
Protein, ur: 30 mg/dL — AB
RBC / HPF: >50 RBC/hpf — ABNORMAL HIGH (ref 0–5)
Specific Gravity, Urine: 1.021 (ref 1.005–1.030)
pH: 6 (ref 5.0–8.0)

## 2021-07-01 LAB — POCT URINALYSIS DIPSTICK, ED / UC
Bilirubin Urine: NEGATIVE
Glucose, UA: NEGATIVE mg/dL
Ketones, ur: NEGATIVE mg/dL
Leukocytes,Ua: NEGATIVE
Nitrite: NEGATIVE
Protein, ur: NEGATIVE mg/dL
Specific Gravity, Urine: 1.015 (ref 1.005–1.030)
Urobilinogen, UA: 1 mg/dL (ref 0.0–1.0)
pH: 6.5 (ref 5.0–8.0)

## 2021-07-01 LAB — I-STAT BETA HCG BLOOD, ED (MC, WL, AP ONLY): I-stat hCG, quantitative: <5 m[IU]/mL (ref <5)

## 2021-07-01 LAB — POC URINE PREG, ED: Preg Test, Ur: NEGATIVE

## 2021-07-01 LAB — CBC
HCT: 41.3 % (ref 36.0–46.0)
Hemoglobin: 13.6 g/dL (ref 12.0–15.0)
MCH: 29.1 pg (ref 26.0–34.0)
MCHC: 32.9 g/dL (ref 30.0–36.0)
MCV: 88.2 fL (ref 80.0–100.0)
Platelets: 311 10*3/uL (ref 150–400)
RBC: 4.68 MIL/uL (ref 3.87–5.11)
RDW: 13.5 % (ref 11.5–15.5)
WBC: 8.1 10*3/uL (ref 4.0–10.5)
nRBC: 0 % (ref 0.0–0.2)

## 2021-07-01 LAB — LIPASE, BLOOD: Lipase: 38 U/L (ref 11–51)

## 2021-07-01 MED ORDER — IBUPROFEN 800 MG PO TABS
800.0000 mg | ORAL_TABLET | Freq: Once | ORAL | Status: AC
  Administered 2021-07-01: 800 mg via ORAL
  Filled 2021-07-01: qty 1

## 2021-07-01 MED ORDER — OXYCODONE HCL 5 MG PO TABS: 5.0000 mg | ORAL_TABLET | Freq: Four times a day (QID) | ORAL | 0 refills | Status: DC | PRN

## 2021-07-01 MED ORDER — ONDANSETRON 4 MG PO TBDP: 4.0000 mg | ORAL_TABLET | Freq: Three times a day (TID) | ORAL | 0 refills | Status: DC | PRN

## 2021-07-01 MED ORDER — ONDANSETRON 8 MG PO TBDP
8.0000 mg | ORAL_TABLET | Freq: Once | ORAL | Status: AC
  Administered 2021-07-01: 8 mg via ORAL
  Filled 2021-07-01: qty 1

## 2021-07-01 NOTE — ED Triage Notes (Signed)
Pt presents with sharp LLQ pain x 3 days.   States she feels something may have popped inside. Pt states the pain has worsened.   States she the first days of pain she had painful urination and states she is currently on her menstrual cycle. Pt states she is not sexually active.   Pt states when she coughs and lays on her back or side she feels more pain.

## 2021-07-01 NOTE — Discharge Instructions (Addendum)
Please use Zofran as needed for nausea.  I prescribed you this to take as needed.  Drink plenty of water.  I also prescribed you 4 tablets of oxycodone which is a strong pain medicine he can use for breakthrough pain otherwise recommend taking Tylenol and ibuprofen only.  Please take Tylenol and ibuprofen as discussed below.  Please use Tylenol or ibuprofen for pain.  You may use 600 mg ibuprofen every 6 hours or 1000 mg of Tylenol every 6 hours.  You may choose to alternate between the 2.  This would be most effective.  Not to exceed 4 g of Tylenol within 24 hours.  Not to exceed 3200 mg ibuprofen 24 hours.   Please follow-up with your OB/GYN.  If you do not have an OB/GYN please call the number for the [facility] that I have provided with.

## 2021-07-01 NOTE — ED Provider Notes (Signed)
Emergency Medicine Provider Triage Evaluation Note  Rhonda Hester , a 45 y.o. female  was evaluated in triage.  Pt complains of  left pelvic pain x 2 days. "Felt something pop". Currently on her menstrual cycle. Seen at [facility] and send here for "internal bleeding". Prior hx of fibroids in the past, has taken ibuprofen once without improvement. Right sided chest pain yesterday. Prior surgery of kidney stones, does not feel the same.   Review of Systems  Positive: Pelvic pain, vomiting Negative: Urinary symptoms  Physical Exam  BP (!) 158/106 (BP Location: Left Arm)   Pulse 80   Temp 98.3 F (36.8 C) (Oral)   Resp (!) 22   LMP 06/22/2021 (Approximate)   SpO2 95%  Gen:   Awake, no distress   Resp:  Normal effort  MSK:   Moves extremities without difficulty  Other:    Medical Decision Making  Medically screening exam initiated at 3:04 PM.  Appropriate orders placed.  Laria Grimmett was informed that the remainder of the evaluation will be completed by another provider, this initial triage assessment does not replace that evaluation, and the importance of remaining in the ED until their evaluation is complete.  Similar symptoms in the past from a cyst rupture.    Janeece Fitting, PA-C 07/01/21 1507    Daleen Bo, MD 07/01/21 [phone]

## 2021-07-01 NOTE — ED Provider Notes (Addendum)
[facility]    CSN: [id number] Arrival date & time: 07/01/21  1156      History   Chief Complaint Chief Complaint  Patient presents with   Abdominal Pain    LLQ    HPI Rhonda Hester is a 45 y.o. female.   HPI  Abdominal Pain: Pt reports that for the past 3 days she has had sharp LLQ abdominal pain. Pain is worsened when she coughs or with positional changes of her abdomen. She is concerned that something may have "popped inside" as she felt this sensation along with worsening pain. Pain is rated as severe. She also reports being on her menstrual cycle which is heavier than normal and is earlier than normal as she is on Depo Provera and she does not usually get periods on this medication. She denies current dysuria, vomiting, diarrhea. She has a low grade fever in office today.   Past Medical History:  Diagnosis Date   Allergic asthma    Allergy    SEASONAL   Anxiety    Breast mass 05/2017   lt breast lump   Eczema    Fibroid    GERD (gastroesophageal reflux disease)    History of anal fissures    History of Bell's palsy    2006  RIGHT SIDE--  RESOLVED   History of gastroesophageal reflux (GERD)    History of kidney stones    Hyperlipidemia    Interstitial cystitis    Psoriasis    Sarcoidosis of lung ([place])    DX 2006--  PULMOLOGIST--  DR [facility]- on 2 liters of oxygen, goes to pain clinic   Seasonal allergic rhinitis    Shortness of breath    Vaginal Pap smear, abnormal     Patient Active Problem List   Diagnosis Date Noted   Moderate episode of recurrent major depressive disorder ([place]) 05/05/2020   Dysmenorrhea 04/23/2020   Dysplasia of cervix, low grade (CIN 1) 03/23/2020   HPV (human papilloma virus) infection 03/23/2020   Abnormal Pap smear of cervix 11/12/2019   Chronic back pain 10/03/2019   Eczema 10/03/2019   GERD (gastroesophageal reflux disease) 10/03/2019   Hyperlipidemia, unspecified 10/03/2019   Iron deficiency anemia, unspecified  10/03/2019   Mild intermittent asthma without complication 60/73/7106   Obesity (BMI 30-39.9) 10/03/2019   Tobacco user 10/03/2019   Chronic pain syndrome 10/03/2019   Sarcoidosis of lung ([place]) 08/22/2018   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   Enlarged submental lymph node 03/30/2018   Nodule of soft tissue 03/22/2018   Hemorrhoids 02/13/2018   TMJ (temporomandibular joint disorder) 12/23/2017   Acute pain of right shoulder 11/26/2017   Chronic bilateral low back pain without sciatica 08/01/2017   Chronic pain of right knee 08/01/2017   Depression 04/02/2017   Pelvic pain 03/28/2017   Obesity (BMI 30.0-34.9) 03/19/2017   Generalized pain 26/94/8546   Metabolic syndrome 27/02/5008   Medication management 03/19/2017   Tobacco abuse 01/24/2017   Uterine fibroid 01/17/2017   Chronic interstitial cystitis 01/17/2017   Menorrhagia 12/06/2016   Acne vulgaris 05/10/2016   BMI 31.0-31.9,adult 05/10/2016   Weight gain 05/10/2016   Right hip pain 10/27/2014   Cervical adenopathy 03/12/2014   History of smoking 08/07/2013   Allergic asthma 07/22/2011    Past Surgical History:  Procedure Laterality Date   CYSTO WITH HYDRODISTENSION N/A 08/24/2013   Procedure: CYSTOSCOPY/HYDRODISTENSION with instillation of marcaine and pyridium;  Surgeon: Fredricka Bonine, MD;  Location: [facility];  Service: Urology;  Laterality: N/A;   CYSTOSCOPY/ HYDRODISTENTION/ BLADDER BX  06-09-2010   CYSTOSCOPY/URETEROSCOPY/HOLMIUM LASER/STENT PLACEMENT Right 01/30/2019   Procedure: CYSTOSCOPY/RETROGRADE/URETEROSCOPY/HOLMIUM LASER/STENT PLACEMENT;  Surgeon: Festus Aloe, MD;  Location: [facility];  Service: Urology;  Laterality: Right;   DILATION AND CURETTAGE OF UTERUS  1997   EXTRACORPOREAL SHOCK WAVE LITHOTRIPSY Right 11/30/2018   Procedure: EXTRACORPOREAL SHOCK WAVE LITHOTRIPSY (ESWL);  Surgeon: Bjorn Loser, MD;  Location: [facility];  Service: Urology;  Laterality:  Right;   IR TRANSCATHETER BX  06/14/2018   IR US GUIDE VASC ACCESS RIGHT  06/14/2018   IR VENOGRAM HEPATIC W HEMODYNAMIC EVALUATION  06/14/2018    OB History     Gravida  1   Para  0   Term  0   Preterm  0   AB  1   Living  0      SAB  1   IAB  0   Ectopic  0   Multiple  0   Live Births               Home Medications    Prior to Admission medications   Medication Sig Start Date End Date Taking? Authorizing Provider  Adalimumab (HUMIRA PEN) 40 MG/0.4ML PNKT Inject into the skin. 04/22/21 05/22/21  [provider]  Adapalene (DIFFERIN) 0.3 % gel QHS to face, wash off QAM 04/15/21   Brendolyn Patty, MD  albuterol [facility] HFA) 108 (90 Base) MCG/ACT inhaler Inhale 2 puffs into the lungs every 6 (six) hours as needed for shortness of breath. 05/10/18   Dorena Dew, FNP  Cetirizine HCl 10 MG CAPS Take 1 capsule (10 mg total) by mouth daily. 05/11/19   Wieters, Hallie C, PA-C  clindamycin (CLEOCIN-T) 1 % lotion Apply QAM to face, wash off QHS 04/15/21   Brendolyn Patty, MD  clobetasol cream (TEMOVATE) 9.56 % Apply 1 application topically 2 (two) times daily. 03/11/21   Brendolyn Patty, MD  clotrimazole-betamethasone (LOTRISONE) cream Apply 1 application topically 2 (two) times daily. 03/12/21   Vevelyn Francois, NP  DULERA 200-5 MCG/ACT AERO Inhale 2 puffs into the lungs 2 (two) times daily. 03/29/20   [provider]  Fluocinolone Acetonide Body 0.01 % OIL Apply topically. 01/29/21   [provider]  folic acid (FOLVITE) 1 MG tablet Take by mouth. 12/03/20 12/03/21  [provider]  halobetasol (ULTRAVATE) 0.05 % cream Apply every night to hands and feet 03/17/21   Brendolyn Patty, MD  ibuprofen (ADVIL) 200 MG tablet Take 600-800 mg by mouth every 6 (six) hours as needed for moderate pain.    [provider]  ketoconazole (NIZORAL) 2 % shampoo Apply 1 application topically 2 (two) times a week. 03/12/21   Brendolyn Patty, MD  medroxyPROGESTERone  Acetate 150 MG/ML SUSY Inject 1 mL (150 mg total) into the muscle every 3 (three) months. 02/16/21   Vevelyn Francois, NP  methotrexate (RHEUMATREX) 2.5 MG tablet Take 2.5 mg by mouth every Wednesday. Caution: Chemotherapy. Protect from light.    [provider]  metroNIDAZOLE (METROCREAM) 0.75 % cream Apply topically 2 (two) times daily. 01/29/21   [provider]  naloxone (NARCAN) nasal spray 4 mg/0.1 mL OPIOID EMERGENCY: 1 SPRAY INTO ONE NOSTRIL , MAY REPEAT 2-3 MIN UNTIL RESPONSIVE OR EMS ARRIVES. 01/14/21   [provider]  neomycin-polymyxin-hydrocortisone (CORTISPORIN) OTIC solution Place 3 drops into both ears 4 (four) times daily. 06/22/21   Vevelyn Francois, NP  OTEZLA 30 MG TABS Take  1 tablet by mouth 2 (two) times daily. 04/30/21   [provider]  Oxycodone HCl 10 MG TABS Take 10 mg by mouth every 4 (four) hours as needed (pain).    [provider]  Sulfacetamide Sodium-Sulfur 10-5 % SUSP Apply topically 2 (two) times daily. 01/29/21 01/29/22  [provider]  tazarotene (AVAGE) 0.1 % cream Apply every night to hands and feet 03/17/21   Brendolyn Patty, MD  tiZANidine (ZANAFLEX) 4 MG tablet Take 4 mg by mouth 3 (three) times daily. 06/10/20   [provider]  famotidine (PEPCID) 20 MG tablet Take 1 tablet (20 mg total) by mouth 2 (two) times daily. 05/24/19 12/01/20  Lanae Boast, FNP    Family History Family History  Problem Relation Age of Onset   Brain cancer Maternal Grandmother    Hyperlipidemia Other    Sleep apnea Other    Depression Maternal Aunt    Seizures Maternal Uncle    Colon cancer Neg Hx    Stomach cancer Neg Hx     Social History Social History   Tobacco Use   Smoking status: Former    Packs/day: 0.30    Years: 6.00    Pack years: 1.80    Types: Cigarettes, Cigars    Quit date: 11/2016    Years since quitting: 4.5   Smokeless tobacco: Never   Tobacco comments:    ONE CIG. DAILY /  1 PPMONTH  Vaping Use    Vaping Use: Never used  Substance Use Topics   Alcohol use: No    Alcohol/week: 0.0 standard drinks   Drug use: No    Comment: HX MARIJUANA USE     Allergies   Acyclovir and related, Citric acid, Dust mite extract, Grapeseed extract [nutritional supplements], Pineapple, Pollen extract, and Nickel   Review of Systems Review of Systems  As stated above in HPI Physical Exam Triage Vital Signs ED Triage Vitals  Enc Vitals Group     BP 07/01/21 1313 119/80     Pulse Rate 07/01/21 1312 89     Resp 07/01/21 1312 17     Temp 07/01/21 1313 99.5 F (37.5 C)     Temp Source 07/01/21 1312 Oral     SpO2 07/01/21 1312 98 %     Weight --      Height --      Head Circumference --      Peak Flow --      Pain Score 07/01/21 1310 9     Pain Loc --      Pain Edu? --      Excl. in [place]? --    No data found.  Updated Vital Signs BP 119/80   Pulse 89   Temp 99.5 F (37.5 C) (Oral)   Resp 17   LMP 06/22/2021 (Approximate)   SpO2 98%   Physical Exam Vitals and nursing note reviewed.  Constitutional:      General: She is in acute distress.     Appearance: She is well-developed. She is ill-appearing. She is not toxic-appearing or diaphoretic.  HENT:     Head: Normocephalic and atraumatic.  Cardiovascular:     Rate and Rhythm: Normal rate and regular rhythm.     Heart sounds: Normal heart sounds.  Pulmonary:     Effort: Pulmonary effort is normal.     Breath sounds: Normal breath sounds.  Abdominal:     General: Abdomen is flat. Bowel sounds are normal.     Palpations:  Abdomen is rigid.     Tenderness: There is abdominal tenderness in the left lower quadrant. There is guarding and rebound. There is no right CVA tenderness or left CVA tenderness. Negative signs include Murphy's sign and McBurney's sign.     Hernia: No hernia is present.  Skin:    Coloration: Skin is not cyanotic, jaundiced or pale.  Neurological:     Mental Status: She is alert.     UC Treatments /  Results  Labs (all labs ordered are listed, but only abnormal results are displayed) Labs Reviewed  POCT URINALYSIS DIPSTICK, ED / UC - Abnormal; Notable for the following components:      Result Value   Hgb urine dipstick MODERATE (*)    All other components within normal limits  URINE CULTURE  POC URINE PREG, ED    EKG   Radiology No results found.  Procedures Procedures (including critical care time)  Medications Ordered in UC Medications - No data to display  Initial Impression / Assessment and Plan / UC Course  I have reviewed the triage vital signs and the nursing notes.  Pertinent labs & imaging results that were available during my care of the patient were reviewed by me and considered in my medical decision making (see chart for details).     New.  I am concerned that she may have a bleeding ovarian cyst that has ruptured versus diverticulitis versus appendicitis that is atypical versus ovarian torsion to prevent further systemic complication and injury.  I have recommended that she be seen in the emergency room for further management.  Patient prefers private vehicle transfer.  At this time she does appear stable enough and vital signs for her at that plan.  She will be n.p.o. until evaluated in the emergency room. Final Clinical Impressions(s) / UC Diagnoses   Final diagnoses:  None   Discharge Instructions   None    ED Prescriptions   None    PDMP not reviewed this encounter.   Hughie Closs, PA-C 07/01/21 [place], [place] 07/01/21 1432

## 2021-07-01 NOTE — ED Triage Notes (Addendum)
Patient seen at [facility] today.   Per UC note "  Abdominal Pain: Pt reports that for the past 3 days she has had sharp LLQ abdominal pain. Pain is worsened when she coughs or with positional changes of her abdomen. She is concerned that something may have "popped inside" as she felt this sensation along with worsening pain. Pain is rated as severe. She also reports being on her menstrual cycle which is heavier than normal and is earlier than normal as she is on Depo Provera and she does not usually get periods on this medication. She denies current dysuria, vomiting, diarrhea. She has a low grade fever in office today. "  Worried for bleeding ovarian cyst

## 2021-07-01 NOTE — ED Provider Notes (Signed)
[place] DEPT Provider Note   CSN: [id number] Arrival date & time: 07/01/21  1449     History Chief Complaint  Patient presents with   Abdominal Pain    Rhonda Hester is a 45 y.o. female.  HPI Patient is a 45 year old female with past medical history significant for allergies, anxiety, eczema, fibroids, reflux, HLD, psoriasis, sarcoidosis  Patient is presented to the emergency room today with complaints of 3 days of lower abdominal/suprapubic pain.  She states it is achy crampy and associated with vaginal bleeding.  She states that she does not usually have menstrual cycle given that she is on progestin Depo shot.  She denies any lightheadedness or dizziness no shortness of breath chest pain.  She states that she has had some nausea with occasional episodes of vomiting.  She states that she has had similar symptoms in the past when she had menstrual cycles however given that she is on the Depo shot she states that the symptoms are out of the normal for her.  She denies any urinary frequency urgency or dysuria denies any upper abdominal pain.  No other severe associate symptoms.  No aggravating mitigating factors.  She is taken no medications prior to arrival because she had vomiting earlier today and was unable to tolerate any p.o. medication    Past Medical History:  Diagnosis Date   Allergic asthma    Allergy    SEASONAL   Anxiety    Breast mass 05/2017   lt breast lump   Eczema    Fibroid    GERD (gastroesophageal reflux disease)    History of anal fissures    History of Bell's palsy    2006  RIGHT SIDE--  RESOLVED   History of gastroesophageal reflux (GERD)    History of kidney stones    Hyperlipidemia    Interstitial cystitis    Psoriasis    Sarcoidosis of lung ([place])    DX 2006--  PULMOLOGIST--  DR PYKD- on 2 liters of oxygen, goes to pain clinic   Seasonal allergic rhinitis    Shortness of breath    Vaginal Pap smear,  abnormal     Patient Active Problem List   Diagnosis Date Noted   Moderate episode of recurrent major depressive disorder ([place]) 05/05/2020   Dysmenorrhea 04/23/2020   Dysplasia of cervix, low grade (CIN 1) 03/23/2020   HPV (human papilloma virus) infection 03/23/2020   Abnormal Pap smear of cervix 11/12/2019   Chronic back pain 10/03/2019   Eczema 10/03/2019   GERD (gastroesophageal reflux disease) 10/03/2019   Hyperlipidemia, unspecified 10/03/2019   Iron deficiency anemia, unspecified 10/03/2019   Mild intermittent asthma without complication 98/33/8250   Obesity (BMI 30-39.9) 10/03/2019   Tobacco user 10/03/2019   Chronic pain syndrome 10/03/2019   Sarcoidosis of lung ([place]) 08/22/2018   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   Enlarged submental lymph node 03/30/2018   Nodule of soft tissue 03/22/2018   Hemorrhoids 02/13/2018   TMJ (temporomandibular joint disorder) 12/23/2017   Acute pain of right shoulder 11/26/2017   Chronic bilateral low back pain without sciatica 08/01/2017   Chronic pain of right knee 08/01/2017   Depression 04/02/2017   Pelvic pain 03/28/2017   Obesity (BMI 30.0-34.9) 03/19/2017   Generalized pain 53/97/6734   Metabolic syndrome 19/37/9024   Medication management 03/19/2017   Tobacco abuse 01/24/2017   Uterine fibroid 01/17/2017   Chronic interstitial cystitis 01/17/2017   Menorrhagia 12/06/2016   Acne vulgaris 05/10/2016  BMI 31.0-31.9,adult 05/10/2016   Weight gain 05/10/2016   Right hip pain 10/27/2014   Cervical adenopathy 03/12/2014   History of smoking 08/07/2013   Allergic asthma 07/22/2011    Past Surgical History:  Procedure Laterality Date   CYSTO WITH HYDRODISTENSION N/A 08/24/2013   Procedure: CYSTOSCOPY/HYDRODISTENSION with instillation of marcaine and pyridium;  Surgeon: Fredricka Bonine, MD;  Location: [facility];  Service: Urology;  Laterality: N/A;   CYSTOSCOPY/ HYDRODISTENTION/  BLADDER BX  06-09-2010   CYSTOSCOPY/URETEROSCOPY/HOLMIUM LASER/STENT PLACEMENT Right 01/30/2019   Procedure: CYSTOSCOPY/RETROGRADE/URETEROSCOPY/HOLMIUM LASER/STENT PLACEMENT;  Surgeon: Festus Aloe, MD;  Location: [facility];  Service: Urology;  Laterality: Right;   DILATION AND CURETTAGE OF UTERUS  1997   EXTRACORPOREAL SHOCK WAVE LITHOTRIPSY Right 11/30/2018   Procedure: EXTRACORPOREAL SHOCK WAVE LITHOTRIPSY (ESWL);  Surgeon: Bjorn Loser, MD;  Location: [facility];  Service: Urology;  Laterality: Right;   IR TRANSCATHETER BX  06/14/2018   IR US GUIDE VASC ACCESS RIGHT  06/14/2018   IR VENOGRAM HEPATIC W HEMODYNAMIC EVALUATION  06/14/2018     OB History     Gravida  1   Para  0   Term  0   Preterm  0   AB  1   Living  0      SAB  1   IAB  0   Ectopic  0   Multiple  0   Live Births              Family History  Problem Relation Age of Onset   Brain cancer Maternal Grandmother    Hyperlipidemia Other    Sleep apnea Other    Depression Maternal Aunt    Seizures Maternal Uncle    Colon cancer Neg Hx    Stomach cancer Neg Hx     Social History   Tobacco Use   Smoking status: Former    Packs/day: 0.30    Years: 6.00    Pack years: 1.80    Types: Cigarettes, Cigars    Quit date: 11/2016    Years since quitting: 4.5   Smokeless tobacco: Never   Tobacco comments:    ONE CIG. DAILY /  1 PPMONTH  Vaping Use   Vaping Use: Never used  Substance Use Topics   Alcohol use: No    Alcohol/week: 0.0 standard drinks   Drug use: No    Comment: HX MARIJUANA USE    Home Medications Prior to Admission medications   Medication Sig Start Date End Date Taking? Authorizing Provider  ondansetron (ZOFRAN ODT) 4 MG disintegrating tablet Take 1 tablet (4 mg total) by mouth every 8 (eight) hours as needed for nausea or vomiting. 07/01/21  Yes Donaven Criswell S, PA  oxyCODONE (ROXICODONE) 5 MG immediate release tablet Take 1 tablet (5 mg total) by mouth every 6 (six) hours as  needed for up to 6 doses for severe pain or breakthrough pain. 07/01/21  Yes Reshunda Strider S, PA  Adalimumab (HUMIRA PEN) 40 MG/0.4ML PNKT Inject into the skin. 04/22/21 05/22/21  [provider]  Adapalene (DIFFERIN) 0.3 % gel QHS to face, wash off QAM 04/15/21   Brendolyn Patty, MD  albuterol [facility] HFA) 108 (90 Base) MCG/ACT inhaler Inhale 2 puffs into the lungs every 6 (six) hours as needed for shortness of breath. 05/10/18   Dorena Dew, FNP  Cetirizine HCl 10 MG CAPS Take 1 capsule (10 mg total) by mouth daily. 05/11/19   Wieters, Hallie C, PA-C  clindamycin (CLEOCIN-T)  1 % lotion Apply QAM to face, wash off QHS 04/15/21   Brendolyn Patty, MD  clobetasol cream (TEMOVATE) 1.76 % Apply 1 application topically 2 (two) times daily. 03/11/21   Brendolyn Patty, MD  clotrimazole-betamethasone (LOTRISONE) cream Apply 1 application topically 2 (two) times daily. 03/12/21   Vevelyn Francois, NP  DULERA 200-5 MCG/ACT AERO Inhale 2 puffs into the lungs 2 (two) times daily. 03/29/20   [provider]  Fluocinolone Acetonide Body 0.01 % OIL Apply topically. 01/29/21   [provider]  folic acid (FOLVITE) 1 MG tablet Take by mouth. 12/03/20 12/03/21  [provider]  halobetasol (ULTRAVATE) 0.05 % cream Apply every night to hands and feet 03/17/21   Brendolyn Patty, MD  ibuprofen (ADVIL) 200 MG tablet Take 600-800 mg by mouth every 6 (six) hours as needed for moderate pain.    [provider]  ketoconazole (NIZORAL) 2 % shampoo Apply 1 application topically 2 (two) times a week. 03/12/21   Brendolyn Patty, MD  medroxyPROGESTERone Acetate 150 MG/ML SUSY Inject 1 mL (150 mg total) into the muscle every 3 (three) months. 02/16/21   Vevelyn Francois, NP  methotrexate (RHEUMATREX) 2.5 MG tablet Take 2.5 mg by mouth every Wednesday. Caution: Chemotherapy. Protect from light.    [provider]  metroNIDAZOLE (METROCREAM) 0.75 % cream Apply topically 2 (two) times daily. 01/29/21    [provider]  naloxone (NARCAN) nasal spray 4 mg/0.1 mL OPIOID EMERGENCY: 1 SPRAY INTO ONE NOSTRIL , MAY REPEAT 2-3 MIN UNTIL RESPONSIVE OR EMS ARRIVES. 01/14/21   [provider]  neomycin-polymyxin-hydrocortisone (CORTISPORIN) OTIC solution Place 3 drops into both ears 4 (four) times daily. 06/22/21   Vevelyn Francois, NP  OTEZLA 30 MG TABS Take 1 tablet by mouth 2 (two) times daily. 04/30/21   [provider]  Sulfacetamide Sodium-Sulfur 10-5 % SUSP Apply topically 2 (two) times daily. 01/29/21 01/29/22  [provider]  tazarotene (AVAGE) 0.1 % cream Apply every night to hands and feet 03/17/21   Brendolyn Patty, MD  tiZANidine (ZANAFLEX) 4 MG tablet Take 4 mg by mouth 3 (three) times daily. 06/10/20   [provider]  famotidine (PEPCID) 20 MG tablet Take 1 tablet (20 mg total) by mouth 2 (two) times daily. 05/24/19 12/01/20  Lanae Boast, FNP    Allergies    Acyclovir and related, Citric acid, Dust mite extract, Grapeseed extract [nutritional supplements], Pineapple, Pollen extract, and Nickel  Review of Systems   Review of Systems  Constitutional:  Negative for chills and fever.  HENT:  Negative for congestion.   Eyes:  Negative for pain.  Respiratory:  Negative for cough and shortness of breath.   Cardiovascular:  Negative for chest pain and leg swelling.  Gastrointestinal:  Positive for abdominal pain, nausea and vomiting. Negative for abdominal distention and diarrhea.  Genitourinary:  Negative for dysuria.  Musculoskeletal:  Negative for myalgias.  Skin:  Negative for rash.  Neurological:  Negative for dizziness and headaches.   Physical Exam Updated Vital Signs BP (!) 151/106   Pulse 91   Temp 98.3 F (36.8 C) (Oral)   Resp 17   LMP 06/22/2021 (Approximate)   SpO2 96%   Physical Exam Vitals and nursing note reviewed.  Constitutional:      General: She is not in acute distress. HENT:     Head: Normocephalic and atraumatic.      Nose: Nose normal.  Eyes:     General: No scleral icterus. Cardiovascular:  Rate and Rhythm: Normal rate and regular rhythm.     Pulses: Normal pulses.     Heart sounds: Normal heart sounds.  Pulmonary:     Effort: Pulmonary effort is normal. No respiratory distress.     Breath sounds: No wheezing.  Abdominal:     Palpations: Abdomen is soft.     Tenderness: There is abdominal tenderness.     Comments: Abdomen is obese, there is some suprapubic/pelvic tenderness no right or left lower abdominal tenderness.  No guarding or rebound.  Musculoskeletal:     Cervical back: Normal range of motion.     Right lower leg: No edema.     Left lower leg: No edema.  Skin:    General: Skin is warm and dry.     Capillary Refill: Capillary refill takes less than 2 seconds.  Neurological:     Mental Status: She is alert. Mental status is at baseline.  Psychiatric:        Mood and Affect: Mood normal.        Behavior: Behavior normal.    ED Results / Procedures / Treatments   Labs (all labs ordered are listed, but only abnormal results are displayed) Labs Reviewed  COMPREHENSIVE METABOLIC PANEL - Abnormal; Notable for the following components:      Result Value   Total Protein 8.6 (*)    Total Bilirubin 1.5 (*)    All other components within normal limits  URINALYSIS, ROUTINE W REFLEX MICROSCOPIC - Abnormal; Notable for the following components:   Color, Urine AMBER (*)    APPearance HAZY (*)    Hgb urine dipstick LARGE (*)    Protein, ur 30 (*)    RBC / HPF >50 (*)    All other components within normal limits  LIPASE, BLOOD  CBC  I-STAT BETA HCG BLOOD, ED ([facility], [facility], [facility] ONLY)    EKG None  Radiology US Transvaginal Non-OB  Result Date: 07/01/2021 CLINICAL DATA:  LEFT lower quadrant pain, LMP 06/20/2021, G0 EXAM: TRANSABDOMINAL AND TRANSVAGINAL ULTRASOUND OF PELVIS TECHNIQUE: Both transabdominal and transvaginal ultrasound examinations of the pelvis were performed. Transabdominal  technique was performed for global imaging of the pelvis including uterus, ovaries, adnexal regions, and pelvic cul-de-sac. It was necessary to proceed with endovaginal exam following the transabdominal exam to visualize the uterus, endometrium, and ovaries. COMPARISON:  None FINDINGS: Uterus Measurements: 9.6 x 6.4 x 7.2 cm = volume: 232 mL. Anteverted. Large anterior fundal leiomyoma, transmural, 6.4 x 5.6 x 6.1 cm. Smaller leiomyomata are seen intramural to LEFT 2.8 x 2.9 x 2.7 cm and posterior RIGHT transmural 3.9 x 3.2 x 3.7 cm. Endometrium Thickness: 3 mm.  No endometrial fluid or focal abnormality Right ovary Not visualized, likely obscured by bowel Left ovary Not visualized, likely obscured by bowel Other: No free pelvic fluid.  No adnexal masses. IMPRESSION: Enlarged uterus containing multiple leiomyomata, 2 of which extend submucosal. Unremarkable endometrial complex. Nonvisualization of ovaries. Electronically Signed   By: Lavonia Dana M.D.   On: 07/01/2021 17:01   US Pelvis Complete  Result Date: 07/01/2021 CLINICAL DATA:  LEFT lower quadrant pain, LMP 06/20/2021, G0 EXAM: TRANSABDOMINAL AND TRANSVAGINAL ULTRASOUND OF PELVIS TECHNIQUE: Both transabdominal and transvaginal ultrasound examinations of the pelvis were performed. Transabdominal technique was performed for global imaging of the pelvis including uterus, ovaries, adnexal regions, and pelvic cul-de-sac. It was necessary to proceed with endovaginal exam following the transabdominal exam to visualize the uterus, endometrium, and ovaries. COMPARISON:  None FINDINGS: Uterus  Measurements: 9.6 x 6.4 x 7.2 cm = volume: 232 mL. Anteverted. Large anterior fundal leiomyoma, transmural, 6.4 x 5.6 x 6.1 cm. Smaller leiomyomata are seen intramural to LEFT 2.8 x 2.9 x 2.7 cm and posterior RIGHT transmural 3.9 x 3.2 x 3.7 cm. Endometrium Thickness: 3 mm.  No endometrial fluid or focal abnormality Right ovary Not visualized, likely obscured by bowel Left ovary  Not visualized, likely obscured by bowel Other: No free pelvic fluid.  No adnexal masses. IMPRESSION: Enlarged uterus containing multiple leiomyomata, 2 of which extend submucosal. Unremarkable endometrial complex. Nonvisualization of ovaries. Electronically Signed   By: Lavonia Dana M.D.   On: 07/01/2021 17:01    Procedures Procedures   Medications Ordered in ED Medications  ondansetron (ZOFRAN-ODT) disintegrating tablet 8 mg (8 mg Oral Given 07/01/21 2117)  ibuprofen (ADVIL) tablet 800 mg (800 mg Oral Given 07/01/21 2117)    ED Course  I have reviewed the triage vital signs and the nursing notes.  Pertinent labs & imaging results that were available during my care of the patient were reviewed by me and considered in my medical decision making (see chart for details).    MDM Rules/Calculators/A&P                          Patient is a 45 year old female History of fibroids.  Seems that she normally does not have significant dysfunctional uterine bleeding given that she has been on the Depo shot however she seems to have had breakthrough bleeding now.  Physical exam noted for some pelvic tenderness  Ultrasound shows large uterus containing multiple fibroids.  I-STAT hCG negative for pregnancy.  Lipase within normal limits.  CBC unremarkable and CMP unremarkable.  Urinalysis unremarkable apart from heavy bleeding.  On my reexamination it seems that her tenderness is suprapubic/pelvic I suspect that this is related to her fibroids.  Also was sinking up with her menstrual cycle.  Tylenol ibuprofen recommendations given and she was discharged with 4 tablets of Roxicodone for severe breakthrough pain.  She will follow-up with her OB/GYN.  Given information for MAU if needed.  Return precautions given she has hemoglobin within normal limits.  Ultimately will need to follow-up with OB/GYN  Patient is understanding of plan and agreeable.  Given Zofran tablet here and p.o. challenge with no  difficulty prior to discharge.  Final Clinical Impression(s) / ED Diagnoses Final diagnoses:  Dysfunctional uterine bleeding  Fibroid    Rx / DC Orders ED Discharge Orders          Ordered    ondansetron (ZOFRAN ODT) 4 MG disintegrating tablet  Every 8 hours PRN        07/01/21 2120    oxyCODONE (ROXICODONE) 5 MG immediate release tablet  Every 6 hours PRN        07/01/21 2129             Tedd Sias, [place] 07/01/21 2159    Little, Wenda Overland, MD 07/02/21 [phone]

## 2021-07-01 NOTE — Discharge Instructions (Addendum)
Please go directly to the emergency room 

## 2021-07-01 NOTE — ED Notes (Signed)
Patient provided with ginger ale for PO challenge.

## 2021-07-02 DIAGNOSIS — D860 Sarcoidosis of lung: Secondary | ICD-10-CM | POA: Diagnosis not present

## 2021-07-02 DIAGNOSIS — R1032 Left lower quadrant pain: Secondary | ICD-10-CM | POA: Diagnosis not present

## 2021-07-02 DIAGNOSIS — R1031 Right lower quadrant pain: Secondary | ICD-10-CM | POA: Diagnosis not present

## 2021-07-02 DIAGNOSIS — L309 Dermatitis, unspecified: Secondary | ICD-10-CM | POA: Diagnosis not present

## 2021-07-03 ENCOUNTER — Other Ambulatory Visit: Payer: Self-pay

## 2021-07-03 ENCOUNTER — Ambulatory Visit (INDEPENDENT_AMBULATORY_CARE_PROVIDER_SITE_OTHER): Payer: Medicare Other | Admitting: Nurse Practitioner

## 2021-07-03 ENCOUNTER — Encounter: Payer: Self-pay | Admitting: Nurse Practitioner

## 2021-07-03 ENCOUNTER — Telehealth (HOSPITAL_COMMUNITY): Payer: Self-pay | Admitting: Emergency Medicine

## 2021-07-03 VITALS — BP 139/80 | HR 83 | Temp 98.1°F | Ht 67.0 in | Wt 228.0 lb

## 2021-07-03 DIAGNOSIS — D259 Leiomyoma of uterus, unspecified: Secondary | ICD-10-CM

## 2021-07-03 DIAGNOSIS — R102 Pelvic and perineal pain: Secondary | ICD-10-CM

## 2021-07-03 LAB — POCT URINALYSIS DIPSTICK
Glucose, UA: NEGATIVE
Ketones, UA: NEGATIVE
Leukocytes, UA: NEGATIVE
Nitrite, UA: NEGATIVE
Protein, UA: POSITIVE — AB
Spec Grav, UA: >=1.03 — AB (ref 1.010–1.025)
Urobilinogen, UA: 1 E.U./dL
pH, UA: 5.5 (ref 5.0–8.0)

## 2021-07-03 LAB — URINE CULTURE: Culture: 20000 — AB

## 2021-07-03 MED ORDER — CEPHALEXIN 500 MG PO CAPS: 500.0000 mg | ORAL_CAPSULE | Freq: Two times a day (BID) | ORAL | 0 refills | Status: AC

## 2021-07-03 NOTE — Progress Notes (Signed)
[address], [place]  [address] Phone:  [phone]   Fax:  [phone]   Established Patient Office Visit  Subjective:  Patient ID: Rhonda Hester, female    DOB: 1976/12/15  Age: 45 y.o. MRN: [id number]  CC:  Chief Complaint  Patient presents with   Vaginal Pain    Was at ED 7/6 abnormal urine culture. Keflex was sent in today has not picked up yet.  US showed 3 fibroids enlarged.     HPI Rhonda Hester presents for abdominal pain. She  has a past medical history of Allergic asthma, Allergy, Anxiety, Breast mass (05/2017), Eczema, Fibroid, GERD (gastroesophageal reflux disease), History of anal fissures, History of Bell's palsy, History of gastroesophageal reflux (GERD), History of kidney stones, Hyperlipidemia, Interstitial cystitis, Psoriasis, Sarcoidosis of lung ([place]), Seasonal allergic rhinitis, Shortness of breath, and Vaginal Pap smear, abnormal.   She having abdominal pain with pelvic pain. She was seen at Urgent care and ED. She was informed by ED that her Fibroids have enlarged. She reports that her pain continues. She follows up with GYN but is requesting a new referral. She is on Depo and reports some breakthrough bleeding.  She is concern about he autoimmune disorder and her current treatment has caused her Psoriasis to spread. This has caused her to be irritable and isolated. She declines counseling or medication management. Past Medical History:  Diagnosis Date   Allergic asthma    Allergy    SEASONAL   Anxiety    Breast mass 05/2017   lt breast lump   Eczema    Fibroid    GERD (gastroesophageal reflux disease)    History of anal fissures    History of Bell's palsy    2006  RIGHT SIDE--  RESOLVED   History of gastroesophageal reflux (GERD)    History of kidney stones    Hyperlipidemia    Interstitial cystitis    Psoriasis    Sarcoidosis of lung ([place])    DX 2006--  PULMOLOGIST--  [facility]- on 2 liters of  oxygen, goes to pain clinic   Seasonal allergic rhinitis    Shortness of breath    Vaginal Pap smear, abnormal     Past Surgical History:  Procedure Laterality Date   CYSTO WITH HYDRODISTENSION N/A 08/24/2013   Procedure: CYSTOSCOPY/HYDRODISTENSION with instillation of marcaine and pyridium;  Surgeon: Fredricka Bonine, MD;  Location: [facility];  Service: Urology;  Laterality: N/A;   CYSTOSCOPY/ HYDRODISTENTION/ BLADDER BX  06-09-2010   CYSTOSCOPY/URETEROSCOPY/HOLMIUM LASER/STENT PLACEMENT Right 01/30/2019   Procedure: CYSTOSCOPY/RETROGRADE/URETEROSCOPY/HOLMIUM LASER/STENT PLACEMENT;  Surgeon: Festus Aloe, MD;  Location: [facility];  Service: Urology;  Laterality: Right;   DILATION AND CURETTAGE OF UTERUS  1997   EXTRACORPOREAL SHOCK WAVE LITHOTRIPSY Right 11/30/2018   Procedure: EXTRACORPOREAL SHOCK WAVE LITHOTRIPSY (ESWL);  Surgeon: Bjorn Loser, MD;  Location: [facility];  Service: Urology;  Laterality: Right;   IR TRANSCATHETER BX  06/14/2018   IR US GUIDE VASC ACCESS RIGHT  06/14/2018   IR VENOGRAM HEPATIC W HEMODYNAMIC EVALUATION  06/14/2018    Family History  Problem Relation Age of Onset   Brain cancer Maternal Grandmother    Hyperlipidemia Other    Sleep apnea Other    Depression Maternal Aunt    Seizures Maternal Uncle    Colon cancer Neg Hx    Stomach cancer Neg Hx     Social History   Socioeconomic History   Marital status: Single  Spouse name: Not on file   Number of children: 0   Years of education: Not on file   Highest education level: Not on file  Occupational History   Occupation: disabled  Tobacco Use   Smoking status: Former    Packs/day: 0.30    Years: 6.00    Pack years: 1.80    Types: Cigarettes, Cigars    Quit date: 11/2016    Years since quitting: 4.6   Smokeless tobacco: Never   Tobacco comments:    ONE CIG. DAILY /  1 PPMONTH  Vaping Use   Vaping Use: Never used  Substance and Sexual Activity   Alcohol use: No     Alcohol/week: 0.0 standard drinks   Drug use: No    Comment: HX MARIJUANA USE   Sexual activity: Not Currently    Birth control/protection: None    Comment: abstinence   Other Topics Concern   Not on file  Social History Narrative   Not on file   Social Determinants of Health   Financial Resource Strain: Not on file  Food Insecurity: Not on file  Transportation Needs: Not on file  Physical Activity: Not on file  Stress: Not on file  Social Connections: Not on file  Intimate Partner Violence: Not on file    Outpatient Medications Prior to Visit  Medication Sig Dispense Refill   Adalimumab (HUMIRA PEN) 40 MG/0.4ML PNKT Inject into the skin.     Adapalene (DIFFERIN) 0.3 % gel QHS to face, wash off QAM 45 g 2   albuterol (PROAIR HFA) 108 (90 Base) MCG/ACT inhaler Inhale 2 puffs into the lungs every 6 (six) hours as needed for shortness of breath. 1 Inhaler 5   cephALEXin (KEFLEX) 500 MG capsule Take 1 capsule (500 mg total) by mouth 2 (two) times daily for 7 days. 14 capsule 0   Cetirizine HCl 10 MG CAPS Take 1 capsule (10 mg total) by mouth daily. 20 capsule 0   clindamycin (CLEOCIN-T) 1 % lotion Apply QAM to face, wash off QHS 60 mL 2   clobetasol cream (TEMOVATE) 4.33 % Apply 1 application topically 2 (two) times daily. 60 g 2   clotrimazole-betamethasone (LOTRISONE) cream Apply 1 application topically 2 (two) times daily. 30 g 0   DULERA 200-5 MCG/ACT AERO Inhale 2 puffs into the lungs 2 (two) times daily.     Fluocinolone Acetonide Body 0.01 % OIL Apply topically.     folic acid (FOLVITE) 1 MG tablet Take by mouth.     halobetasol (ULTRAVATE) 0.05 % cream Apply every night to hands and feet 50 g 2   ibuprofen (ADVIL) 200 MG tablet Take 600-800 mg by mouth every 6 (six) hours as needed for moderate pain.     ketoconazole (NIZORAL) 2 % shampoo Apply 1 application topically 2 (two) times a week. 120 mL 2   medroxyPROGESTERone Acetate 150 MG/ML SUSY Inject 1 mL (150 mg total) into  the muscle every 3 (three) months. 0.9 mL 3   methotrexate (RHEUMATREX) 2.5 MG tablet Take 2.5 mg by mouth every Wednesday. Caution: Chemotherapy. Protect from light.     metroNIDAZOLE (METROCREAM) 0.75 % cream Apply topically 2 (two) times daily.     naloxone (NARCAN) nasal spray 4 mg/0.1 mL OPIOID EMERGENCY: 1 SPRAY INTO ONE NOSTRIL , MAY REPEAT 2-3 MIN UNTIL RESPONSIVE OR EMS ARRIVES.     neomycin-polymyxin-hydrocortisone (CORTISPORIN) OTIC solution Place 3 drops into both ears 4 (four) times daily. 10 mL 0   ondansetron (  ZOFRAN ODT) 4 MG disintegrating tablet Take 1 tablet (4 mg total) by mouth every 8 (eight) hours as needed for nausea or vomiting. 20 tablet 0   OTEZLA 30 MG TABS Take 1 tablet by mouth 2 (two) times daily.     oxyCODONE (ROXICODONE) 5 MG immediate release tablet Take 1 tablet (5 mg total) by mouth every 6 (six) hours as needed for up to 6 doses for severe pain or breakthrough pain. 6 tablet 0   Sulfacetamide Sodium-Sulfur 10-5 % SUSP Apply topically 2 (two) times daily.     tazarotene (AVAGE) 0.1 % cream Apply every night to hands and feet 60 g 2   tiZANidine (ZANAFLEX) 4 MG tablet Take 4 mg by mouth 3 (three) times daily.     Facility-Administered Medications Prior to Visit  Medication Dose Route Frequency Provider Last Rate Last Admin   bupivacaine (MARCAINE) 0.5 % 15 mL, phenazopyridine (PYRIDIUM) 400 mg bladder mixture   Bladder Instillation Once Festus Aloe, MD        Allergies  Allergen Reactions   Acyclovir And Related Other (See Comments)    Patient states it "made me feel like I can't breath"    Citric Acid Other (See Comments)    AVOIDS DUE TO INTERSTITIAL CYSTITIS   Dust Mite Extract Itching and Other (See Comments)     coughing   Grapeseed Extract [Nutritional Supplements] Other (See Comments)    Throat itching and burning   Pineapple Itching    Burning in mouth and tingling lips   Pollen Extract Itching and Other (See Comments)     coughing    Nickel Rash    ROS Review of Systems    Objective:    Physical Exam Constitutional:      Appearance: She is obese.  HENT:     Head: Normocephalic.  Cardiovascular:     Rate and Rhythm: Normal rate.     Pulses: Normal pulses.  Pulmonary:     Effort: Pulmonary effort is normal.  Musculoskeletal:        General: Normal range of motion.     Cervical back: Normal range of motion.  Skin:    General: Skin is warm.     Capillary Refill: Capillary refill takes less than 2 seconds.     Findings: Rash present.     Comments: Dark discoloration hands bilateral feet  Neurological:     General: No focal deficit present.     Mental Status: She is alert and oriented to person, place, and time.  Psychiatric:     Comments: depressed    BP 139/80   Pulse 83   Temp 98.1 F (36.7 C)   Ht 5\' 7"  (1.702 m)   Wt 228 lb 0.4 oz (103.4 kg)   LMP 06/22/2021 (Approximate)   SpO2 98%   BMI 35.71 kg/m  Wt Readings from Last 3 Encounters:  07/07/21 229 lb (103.9 kg)  07/03/21 228 lb 0.4 oz (103.4 kg)  05/04/21 234 lb 0.6 oz (106.2 kg)     Health Maintenance Due  Topic Date Due   COVID-19 Vaccine (1) Never done   Pneumococcal Vaccine 21-66 Years old (3 - PPSV23 or PCV20) 05/10/2021    There are no preventive care reminders to display for this patient.  Lab Results  Component Value Date   TSH 2.330 05/04/2021   Lab Results  Component Value Date   WBC 8.1 07/01/2021   HGB 13.6 07/01/2021   HCT 41.3 07/01/2021   MCV  88.2 07/01/2021   PLT 311 07/01/2021   Lab Results  Component Value Date   NA 139 07/01/2021   K 3.5 07/01/2021   CO2 22 07/01/2021   GLUCOSE 95 07/01/2021   BUN 7 07/01/2021   CREATININE 0.48 07/01/2021   BILITOT 1.5 (H) 07/01/2021   ALKPHOS 67 07/01/2021   AST 21 07/01/2021   ALT 22 07/01/2021   PROT 8.6 (H) 07/01/2021   ALBUMIN 4.5 07/01/2021   CALCIUM 9.6 07/01/2021   ANIONGAP 10 07/01/2021   GFR 138.73 04/25/2018   Lab Results  Component Value Date    CHOL 205 (H) 05/04/2021   Lab Results  Component Value Date   HDL 54 05/04/2021   Lab Results  Component Value Date   LDLCALC 130 (H) 05/04/2021   Lab Results  Component Value Date   TRIG 118 05/04/2021   Lab Results  Component Value Date   CHOLHDL 3.8 05/04/2021   Lab Results  Component Value Date   HGBA1C 4.9 05/10/2018      Assessment & Plan:    Problem List Items Addressed This Visit       Genitourinary   Uterine fibroid  Worsening related to enlargement Referral for treatment options    Relevant Orders   Ambulatory referral to Gynecology     Other   Pelvic pain - Primary Persistent   Relevant Orders   Urinalysis Dipstick (Completed)   Ambulatory referral to Gynecology    No orders of the defined types were placed in this encounter.   Follow-up: No follow-ups on file.    Vevelyn Francois, NP

## 2021-07-03 NOTE — Patient Instructions (Addendum)
Abdominal Pain, Adult Many things can cause belly (abdominal) pain. Most times, belly pain is not dangerous. Many cases of belly pain can be watched and treated at home. Sometimes, though, belly pain is serious. Yourdoctor will try to find the cause of your belly pain. Follow these instructions at home:  Medicines Take over-the-counter and prescription medicines only as told by your doctor. Do not take medicines that help you poop (laxatives) unless told by your doctor. General instructions Watch your belly pain for any changes. Drink enough fluid to keep your pee (urine) pale yellow. Keep all follow-up visits as told by your doctor. This is important. Contact a doctor if: Your belly pain changes or gets worse. You are not hungry, or you lose weight without trying. You are having trouble pooping (constipated) or have watery poop (diarrhea) for more than 2-3 days. You have pain when you pee or poop. Your belly pain wakes you up at night. Your pain gets worse with meals, after eating, or with certain foods. You are vomiting and cannot keep anything down. You have a fever. You have blood in your pee. Get help right away if: Your pain does not go away as soon as your doctor says it should. You cannot stop vomiting. Your pain is only in areas of your belly, such as the right side or the left lower part of the belly. You have bloody or black poop, or poop that looks like tar. You have very bad pain, cramping, or bloating in your belly. You have signs of not having enough fluid or water in your body (dehydration), such as: Dark pee, very little pee, or no pee. Cracked lips. Dry mouth. Sunken eyes. Sleepiness. Weakness. You have trouble breathing or chest pain. Summary Many cases of belly pain can be watched and treated at home. Watch your belly pain for any changes. Take over-the-counter and prescription medicines only as told by your doctor. Contact a doctor if your belly pain  changes or gets worse. Get help right away if you have very bad pain, cramping, or bloating in your belly. This information is not intended to replace advice given to you by your health care provider. Make sure you discuss any questions you have with your healthcare provider. Document Revised: 04/23/2019 Document Reviewed: 04/23/2019 Elsevier Patient Education  2022 [place].  10/19/2019 Office Visit Gynecology Lorain Childes, [address], [address]   [place],  [address]   [phone]   [phone] (Fax)

## 2021-07-07 ENCOUNTER — Other Ambulatory Visit (HOSPITAL_COMMUNITY)
Admission: RE | Admit: 2021-07-07 | Discharge: 2021-07-07 | Disposition: A | Discharge status: Home / Self Care | Payer: Medicare Other | Source: Ambulatory Visit | Attending: Obstetrics | Admitting: Obstetrics

## 2021-07-07 ENCOUNTER — Ambulatory Visit (INDEPENDENT_AMBULATORY_CARE_PROVIDER_SITE_OTHER): Payer: Medicare Other | Admitting: Obstetrics

## 2021-07-07 ENCOUNTER — Other Ambulatory Visit: Payer: Self-pay

## 2021-07-07 ENCOUNTER — Encounter: Payer: Self-pay | Admitting: Obstetrics

## 2021-07-07 VITALS — BP 141/87 | HR 80 | Ht 67.0 in | Wt 229.0 lb

## 2021-07-07 DIAGNOSIS — Z01419 Encounter for gynecological examination (general) (routine) without abnormal findings: Secondary | ICD-10-CM | POA: Diagnosis present

## 2021-07-07 DIAGNOSIS — D251 Intramural leiomyoma of uterus: Secondary | ICD-10-CM

## 2021-07-07 DIAGNOSIS — E669 Obesity, unspecified: Secondary | ICD-10-CM | POA: Diagnosis not present

## 2021-07-07 DIAGNOSIS — Z1151 Encounter for screening for human papillomavirus (HPV): Secondary | ICD-10-CM | POA: Insufficient documentation

## 2021-07-07 DIAGNOSIS — Z3009 Encounter for other general counseling and advice on contraception: Secondary | ICD-10-CM

## 2021-07-07 DIAGNOSIS — Z124 Encounter for screening for malignant neoplasm of cervix: Secondary | ICD-10-CM | POA: Insufficient documentation

## 2021-07-07 DIAGNOSIS — R102 Pelvic and perineal pain: Secondary | ICD-10-CM

## 2021-07-07 NOTE — Addendum Note (Signed)
Addended by: Courtney Heys on: 07/07/2021 01:25 PM   Modules accepted: Orders

## 2021-07-07 NOTE — Progress Notes (Signed)
Subjective:        Rhonda Hester is a 45 y.o. female here for a routine exam.  Current complaints: Pelvic pain from fibroids.Also, she has additional complaints of vaginal discharge.  Personal health questionnaire:  Is patient Ashkenazi Jewish, have a family history of breast and/or ovarian cancer: no Is there a family history of uterine cancer diagnosed at age < 47, gastrointestinal cancer, urinary tract cancer, family member who is a Field seismologist syndrome-associated carrier: no Is the patient overweight and hypertensive, family history of diabetes, personal history of gestational diabetes, preeclampsia or PCOS: no Is patient over 79, have PCOS,  family history of premature CHD under age 39, diabetes, smoke, have hypertension or peripheral artery disease:  no At any time, has a partner hit, kicked or otherwise hurt or frightened you?: no Over the past 2 weeks, have you felt down, depressed or hopeless?: no Over the past 2 weeks, have you felt little interest or pleasure in doing things?:no   Gynecologic History Patient's last menstrual period was 06/22/2021 (approximate). Contraception: abstinence Last Pap: 2017. Results were: normal Last mammogram: November 2021. Results were: normal  Obstetric History OB History  Gravida Para Term Preterm AB Living  1 0 0 0 1 0  SAB IAB Ectopic Multiple Live Births  1 0 0 0      # Outcome Date GA Lbr Len/2nd Weight Sex Delivery Anes PTL Lv  1 SAB             Past Medical History:  Diagnosis Date   Allergic asthma    Allergy    SEASONAL   Anxiety    Breast mass 05/2017   lt breast lump   Eczema    Fibroid    GERD (gastroesophageal reflux disease)    History of anal fissures    History of Bell's palsy    2006  RIGHT SIDE--  RESOLVED   History of gastroesophageal reflux (GERD)    History of kidney stones    Hyperlipidemia    Interstitial cystitis    Psoriasis    Sarcoidosis of lung ([place])    DX 2006--  PULMOLOGIST--  DR ZDGU- on  2 liters of oxygen, goes to pain clinic   Seasonal allergic rhinitis    Shortness of breath    Vaginal Pap smear, abnormal     Past Surgical History:  Procedure Laterality Date   CYSTO WITH HYDRODISTENSION N/A 08/24/2013   Procedure: CYSTOSCOPY/HYDRODISTENSION with instillation of marcaine and pyridium;  Surgeon: Fredricka Bonine, MD;  Location[facility];  Service: Urology;  Laterality: N/A;   CYSTOSCOPY/ HYDRODISTENTION/ BLADDER BX  06-09-2010   CYSTOSCOPY/URETEROSCOPY/HOLMIUM LASER/STENT PLACEMENT Right 01/30/2019   Procedure: CYSTOSCOPY/RETROGRADE/URETEROSCOPY/HOLMIUM LASER/STENT PLACEMENT;  Surgeon: Festus Aloe, MD;  Location: [facility];  Service: Urology;  Laterality: Right;   DILATION AND CURETTAGE OF UTERUS  1997   EXTRACORPOREAL SHOCK WAVE LITHOTRIPSY Right 11/30/2018   Procedure: EXTRACORPOREAL SHOCK WAVE LITHOTRIPSY (ESWL);  Surgeon: Bjorn Loser, MD;  Location: [facility];  Service: Urology;  Laterality: Right;   IR TRANSCATHETER BX  06/14/2018   IR US GUIDE VASC ACCESS RIGHT  06/14/2018   IR VENOGRAM HEPATIC W HEMODYNAMIC EVALUATION  06/14/2018     Current Outpatient Medications:    Adapalene (DIFFERIN) 0.3 % gel, QHS to face, wash off QAM, Disp: 45 g, Rfl: 2   albuterol (PROAIR HFA) 108 (90 Base) MCG/ACT inhaler, Inhale 2 puffs into the lungs every 6 (six) hours as needed for shortness of breath., Disp:  1 Inhaler, Rfl: 5   cephALEXin (KEFLEX) 500 MG capsule, Take 1 capsule (500 mg total) by mouth 2 (two) times daily for 7 days., Disp: 14 capsule, Rfl: 0   Cetirizine HCl 10 MG CAPS, Take 1 capsule (10 mg total) by mouth daily., Disp: 20 capsule, Rfl: 0   clindamycin (CLEOCIN-T) 1 % lotion, Apply QAM to face, wash off QHS, Disp: 60 mL, Rfl: 2   clobetasol cream (TEMOVATE) 2.68 %, Apply 1 application topically 2 (two) times daily., Disp: 60 g, Rfl: 2   clotrimazole-betamethasone (LOTRISONE) cream, Apply 1 application topically 2 (two) times daily., Disp: 30  g, Rfl: 0   DULERA 200-5 MCG/ACT AERO, Inhale 2 puffs into the lungs 2 (two) times daily., Disp: , Rfl:    Fluocinolone Acetonide Body 0.01 % OIL, Apply topically., Disp: , Rfl:    folic acid (FOLVITE) 1 MG tablet, Take by mouth., Disp: , Rfl:    halobetasol (ULTRAVATE) 0.05 % cream, Apply every night to hands and feet, Disp: 50 g, Rfl: 2   ibuprofen (ADVIL) 200 MG tablet, Take 600-800 mg by mouth every 6 (six) hours as needed for moderate pain., Disp: , Rfl:    ketoconazole (NIZORAL) 2 % shampoo, Apply 1 application topically 2 (two) times a week., Disp: 120 mL, Rfl: 2   medroxyPROGESTERone Acetate 150 MG/ML SUSY, Inject 1 mL (150 mg total) into the muscle every 3 (three) months., Disp: 0.9 mL, Rfl: 3   methotrexate (RHEUMATREX) 2.5 MG tablet, Take 2.5 mg by mouth every Wednesday. Caution: Chemotherapy. Protect from light., Disp: , Rfl:    metroNIDAZOLE (METROCREAM) 0.75 % cream, Apply topically 2 (two) times daily., Disp: , Rfl:    naloxone (NARCAN) nasal spray 4 mg/0.1 mL, OPIOID EMERGENCY: 1 SPRAY INTO ONE NOSTRIL , MAY REPEAT 2-3 MIN UNTIL RESPONSIVE OR EMS ARRIVES., Disp: , Rfl:    neomycin-polymyxin-hydrocortisone (CORTISPORIN) OTIC solution, Place 3 drops into both ears 4 (four) times daily., Disp: 10 mL, Rfl: 0   ondansetron (ZOFRAN ODT) 4 MG disintegrating tablet, Take 1 tablet (4 mg total) by mouth every 8 (eight) hours as needed for nausea or vomiting., Disp: 20 tablet, Rfl: 0   OTEZLA 30 MG TABS, Take 1 tablet by mouth 2 (two) times daily., Disp: , Rfl:    oxyCODONE (ROXICODONE) 5 MG immediate release tablet, Take 1 tablet (5 mg total) by mouth every 6 (six) hours as needed for up to 6 doses for severe pain or breakthrough pain., Disp: 6 tablet, Rfl: 0   Sulfacetamide Sodium-Sulfur 10-5 % SUSP, Apply topically 2 (two) times daily., Disp: , Rfl:    tazarotene (AVAGE) 0.1 % cream, Apply every night to hands and feet, Disp: 60 g, Rfl: 2   tiZANidine (ZANAFLEX) 4 MG tablet, Take 4 mg by  mouth 3 (three) times daily., Disp: , Rfl:    Adalimumab (HUMIRA PEN) 40 MG/0.4ML PNKT, Inject into the skin., Disp: , Rfl:  No current facility-administered medications for this visit.  Facility-Administered Medications Ordered in Other Visits:    bupivacaine (MARCAINE) 0.5 % 15 mL, phenazopyridine (PYRIDIUM) 400 mg bladder mixture, , Bladder Instillation, Once, Festus Aloe, MD Allergies  Allergen Reactions   Acyclovir And Related Other (See Comments)    Patient states it "made me feel like I can't breath"    Citric Acid Other (See Comments)    AVOIDS DUE TO INTERSTITIAL CYSTITIS   Dust Mite Extract Itching and Other (See Comments)     coughing   Grapeseed Extract [Nutritional  Supplements] Other (See Comments)    Throat itching and burning   Pineapple Itching    Burning in mouth and tingling lips   Pollen Extract Itching and Other (See Comments)     coughing   Nickel Rash    Social History   Tobacco Use   Smoking status: Former    Packs/day: 0.30    Years: 6.00    Pack years: 1.80    Types: Cigarettes, Cigars    Quit date: 11/2016    Years since quitting: 4.6   Smokeless tobacco: Never   Tobacco comments:    ONE CIG. DAILY /  1 PPMONTH  Substance Use Topics   Alcohol use: No    Alcohol/week: 0.0 standard drinks    Family History  Problem Relation Age of Onset   Brain cancer Maternal Grandmother    Hyperlipidemia Other    Sleep apnea Other    Depression Maternal Aunt    Seizures Maternal Uncle    Colon cancer Neg Hx    Stomach cancer Neg Hx       Review of Systems  Constitutional: negative for fatigue and weight loss Respiratory: negative for cough and wheezing Cardiovascular: negative for chest pain, fatigue and palpitations Gastrointestinal: negative for abdominal pain and change in bowel habits Musculoskeletal:negative for myalgias Neurological: negative for gait problems and tremors Behavioral/Psych: negative for abusive relationship,  depression Endocrine: negative for temperature intolerance    Genitourinary: positive for vaginal discharge and pelvic pain.  negative for abnormal menstrual periods, genital lesions, hot flashes, sexual problems  Integument/breast: negative for breast lump, breast tenderness, nipple discharge and skin lesion(s)    Objective:       BP (!) 141/87   Pulse 80   Ht 5\' 7"  (1.702 m)   Wt 229 lb (103.9 kg)   LMP 06/22/2021 (Approximate)   BMI 35.87 kg/m  General:   Alert and no distress  Skin:   no rash or abnormalities  Lungs:   clear to auscultation bilaterally  Heart:   regular rate and rhythm, S1, S2 normal, no murmur, click, rub or gallop  Breasts:   normal without suspicious masses, skin or nipple changes or axillary nodes  Abdomen:  normal findings: no organomegaly, soft, non-tender and no hernia  Pelvis:  External genitalia: normal general appearance Urinary system: urethral meatus normal and bladder without fullness, nontender Vaginal: normal without tenderness, induration or masses Cervix: normal appearance Adnexa: normal bimanual exam Uterus: anteverted and tender, slightly enlarged   Lab Review Urine pregnancy test Labs reviewed yes Radiologic studies reviewed yes   US Pelvis Complete (Accession [id number]) (Order [id number]) Imaging Date: 07/01/2021 Department: [place] DEPT Released By/Authorizing: Janeece Fitting, PA-C (auto-released)      Exam Status  Status  Final [99]    PACS Intelerad Image Link   Show images for US Pelvis Complete  Study Result  Narrative & Impression  CLINICAL DATA:  LEFT lower quadrant pain, LMP 06/20/2021, G0   EXAM: TRANSABDOMINAL AND TRANSVAGINAL ULTRASOUND OF PELVIS   TECHNIQUE: Both transabdominal and transvaginal ultrasound examinations of the pelvis were performed. Transabdominal technique was performed for global imaging of the pelvis including uterus, ovaries, adnexal regions, and pelvic  cul-de-sac. It was necessary to proceed with endovaginal exam following the transabdominal exam to visualize the uterus, endometrium, and ovaries.   COMPARISON:  None   FINDINGS: Uterus   Measurements: 9.6 x 6.4 x 7.2 cm = volume: 232 mL. Anteverted. Large anterior fundal leiomyoma, transmural, 6.4 x  5.6 x 6.1 cm. Smaller leiomyomata are seen intramural to LEFT 2.8 x 2.9 x 2.7 cm and posterior RIGHT transmural 3.9 x 3.2 x 3.7 cm.   Endometrium   Thickness: 3 mm.  No endometrial fluid or focal abnormality   Right ovary   Not visualized, likely obscured by bowel   Left ovary   Not visualized, likely obscured by bowel   Other: No free pelvic fluid.  No adnexal masses.   IMPRESSION: Enlarged uterus containing multiple leiomyomata, 2 of which extend submucosal.   Unremarkable endometrial complex.   Nonvisualization of ovaries.     Electronically Signed   By: Lavonia Dana M.D.   On: 07/01/2021 17:01      I have spent a total of 20 minutes of face-to-face time, excluding clinical staff time, reviewing notes and preparing to see patient, ordering tests and/or medications, and counseling the patient.   Assessment:    1. Encounter for routine gynecological examination with Papanicolaou smear of cervix  2. Fibroids, symptomatic - considering surgical options  4. Obesity (BMI 30.0-34.9) - weight reduction with the aid of dietary changes, exercise and behavioral modification recommended  5. Encounter for other general counseling or advice on contraception - declines hormonal contraception     Plan:    Education reviewed: calcium supplements, depression evaluation, low fat, low cholesterol diet, safe sex/STD prevention, self breast exams, and weight bearing exercise. Contraception: abstinence. Follow up in: 4 weeks. Dr Rip Harbour to discuss surgical options for symptomatic fibroids   Shelly Bombard, MD 07/07/2021 10:18 AM

## 2021-07-07 NOTE — Progress Notes (Signed)
RGYN patient presents for problem visit.  CC: Pelvic Pain and discomfort on left /right side onset 06/29/21. However, pt states discomfort has been going on for a few months. Pain was sharp and unbearable on 06/29/21. Pt was told fibroids have become larger.  *U/S on 07/01/21. *Currently on Depo.  LMP:06/22/21 Last pap:06/14/16 WNL.

## 2021-07-08 LAB — CERVICOVAGINAL ANCILLARY ONLY
Bacterial Vaginitis (gardnerella): NEGATIVE
Candida Glabrata: NEGATIVE
Candida Vaginitis: NEGATIVE
Chlamydia: NEGATIVE
Comment: NEGATIVE
Comment: NEGATIVE
Comment: NEGATIVE
Comment: NEGATIVE
Comment: NEGATIVE
Comment: NORMAL
Neisseria Gonorrhea: NEGATIVE
Trichomonas: NEGATIVE

## 2021-07-10 DIAGNOSIS — G890 Central pain syndrome: Secondary | ICD-10-CM | POA: Diagnosis not present

## 2021-07-10 DIAGNOSIS — D869 Sarcoidosis, unspecified: Secondary | ICD-10-CM | POA: Diagnosis not present

## 2021-07-10 DIAGNOSIS — G894 Chronic pain syndrome: Secondary | ICD-10-CM | POA: Diagnosis not present

## 2021-07-10 DIAGNOSIS — Z79891 Long term (current) use of opiate analgesic: Secondary | ICD-10-CM | POA: Diagnosis not present

## 2021-07-10 DIAGNOSIS — R519 Headache, unspecified: Secondary | ICD-10-CM | POA: Diagnosis not present

## 2021-07-10 DIAGNOSIS — G8929 Other chronic pain: Secondary | ICD-10-CM | POA: Diagnosis not present

## 2021-07-10 DIAGNOSIS — M79671 Pain in right foot: Secondary | ICD-10-CM | POA: Diagnosis not present

## 2021-07-10 DIAGNOSIS — M25551 Pain in right hip: Secondary | ICD-10-CM | POA: Diagnosis not present

## 2021-07-10 DIAGNOSIS — M545 Low back pain, unspecified: Secondary | ICD-10-CM | POA: Diagnosis not present

## 2021-07-10 DIAGNOSIS — L409 Psoriasis, unspecified: Secondary | ICD-10-CM | POA: Diagnosis not present

## 2021-07-10 DIAGNOSIS — M5431 Sciatica, right side: Secondary | ICD-10-CM | POA: Diagnosis not present

## 2021-07-10 DIAGNOSIS — M25562 Pain in left knee: Secondary | ICD-10-CM | POA: Diagnosis not present

## 2021-07-13 DIAGNOSIS — D860 Sarcoidosis of lung: Secondary | ICD-10-CM | POA: Diagnosis not present

## 2021-07-15 LAB — CYTOLOGY - PAP
Comment: NEGATIVE
Comment: NEGATIVE
Diagnosis: UNDETERMINED — AB
HPV 16: NEGATIVE
HPV 18 / 45: NEGATIVE
High risk HPV: POSITIVE — AB

## 2021-07-19 ENCOUNTER — Emergency Department (HOSPITAL_COMMUNITY): Payer: Medicare Other

## 2021-07-19 ENCOUNTER — Emergency Department (HOSPITAL_COMMUNITY)
Admission: EM | Admit: 2021-07-19 | Discharge: 2021-07-19 | Disposition: A | Discharge status: Home / Self Care | Payer: Medicare Other | Attending: Student | Admitting: Student

## 2021-07-19 ENCOUNTER — Encounter (HOSPITAL_COMMUNITY): Payer: Self-pay | Admitting: Emergency Medicine

## 2021-07-19 DIAGNOSIS — R102 Pelvic and perineal pain: Secondary | ICD-10-CM | POA: Diagnosis not present

## 2021-07-19 DIAGNOSIS — J4520 Mild intermittent asthma, uncomplicated: Secondary | ICD-10-CM | POA: Diagnosis not present

## 2021-07-19 DIAGNOSIS — N200 Calculus of kidney: Secondary | ICD-10-CM | POA: Diagnosis not present

## 2021-07-19 DIAGNOSIS — R1032 Left lower quadrant pain: Secondary | ICD-10-CM | POA: Diagnosis not present

## 2021-07-19 DIAGNOSIS — Z87891 Personal history of nicotine dependence: Secondary | ICD-10-CM | POA: Insufficient documentation

## 2021-07-19 DIAGNOSIS — R197 Diarrhea, unspecified: Secondary | ICD-10-CM | POA: Insufficient documentation

## 2021-07-19 DIAGNOSIS — M545 Low back pain, unspecified: Secondary | ICD-10-CM | POA: Diagnosis not present

## 2021-07-19 DIAGNOSIS — I700 Atherosclerosis of aorta: Secondary | ICD-10-CM | POA: Diagnosis not present

## 2021-07-19 DIAGNOSIS — M5459 Other low back pain: Secondary | ICD-10-CM | POA: Diagnosis not present

## 2021-07-19 DIAGNOSIS — Z7951 Long term (current) use of inhaled steroids: Secondary | ICD-10-CM | POA: Insufficient documentation

## 2021-07-19 DIAGNOSIS — N3289 Other specified disorders of bladder: Secondary | ICD-10-CM | POA: Diagnosis not present

## 2021-07-19 LAB — COMPREHENSIVE METABOLIC PANEL
ALT: 22 U/L (ref 0–44)
AST: 19 U/L (ref 15–41)
Albumin: 3.6 g/dL (ref 3.5–5.0)
Alkaline Phosphatase: 68 U/L (ref 38–126)
Anion gap: 8 (ref 5–15)
BUN: 12 mg/dL (ref 6–20)
CO2: 24 mmol/L (ref 22–32)
Calcium: 9 mg/dL (ref 8.9–10.3)
Chloride: 104 mmol/L (ref 98–111)
Creatinine, Ser: 0.55 mg/dL (ref 0.44–1.00)
GFR, Estimated: >60 mL/min (ref >60)
Glucose, Bld: 125 mg/dL — ABNORMAL HIGH (ref 70–99)
Potassium: 3.2 mmol/L — ABNORMAL LOW (ref 3.5–5.1)
Sodium: 136 mmol/L (ref 135–145)
Total Bilirubin: 0.7 mg/dL (ref 0.3–1.2)
Total Protein: 6.9 g/dL (ref 6.5–8.1)

## 2021-07-19 LAB — CBC WITH DIFFERENTIAL/PLATELET
Abs Immature Granulocytes: 0 10*3/uL (ref 0.00–0.07)
Basophils Absolute: 0 10*3/uL (ref 0.0–0.1)
Basophils Relative: 1 %
Eosinophils Absolute: 0.3 10*3/uL (ref 0.0–0.5)
Eosinophils Relative: 4 %
HCT: 38.2 % (ref 36.0–46.0)
Hemoglobin: 12.6 g/dL (ref 12.0–15.0)
Immature Granulocytes: 0 %
Lymphocytes Relative: 36 %
Lymphs Abs: 2.2 10*3/uL (ref 0.7–4.0)
MCH: 29.2 pg (ref 26.0–34.0)
MCHC: 33 g/dL (ref 30.0–36.0)
MCV: 88.4 fL (ref 80.0–100.0)
Monocytes Absolute: 0.4 10*3/uL (ref 0.1–1.0)
Monocytes Relative: 6 %
Neutro Abs: 3.3 10*3/uL (ref 1.7–7.7)
Neutrophils Relative %: 53 %
Platelets: 261 10*3/uL (ref 150–400)
RBC: 4.32 MIL/uL (ref 3.87–5.11)
RDW: 13.1 % (ref 11.5–15.5)
WBC: 6.2 10*3/uL (ref 4.0–10.5)
nRBC: 0 % (ref 0.0–0.2)

## 2021-07-19 LAB — URINALYSIS, ROUTINE W REFLEX MICROSCOPIC
Bilirubin Urine: NEGATIVE
Glucose, UA: NEGATIVE mg/dL
Hgb urine dipstick: NEGATIVE
Ketones, ur: NEGATIVE mg/dL
Nitrite: NEGATIVE
Protein, ur: 30 mg/dL — AB
Specific Gravity, Urine: 1.03 (ref 1.005–1.030)
pH: 5 (ref 5.0–8.0)

## 2021-07-19 LAB — I-STAT BETA HCG BLOOD, ED (MC, WL, AP ONLY): I-stat hCG, quantitative: <5 m[IU]/mL (ref <5)

## 2021-07-19 MED ORDER — KETOROLAC TROMETHAMINE 60 MG/2ML IM SOLN
60.0000 mg | Freq: Once | INTRAMUSCULAR | Status: AC
  Administered 2021-07-19: 60 mg via INTRAMUSCULAR
  Filled 2021-07-19: qty 2

## 2021-07-19 MED ORDER — NAPROXEN 500 MG PO TABS: 500.0000 mg | ORAL_TABLET | Freq: Two times a day (BID) | ORAL | 0 refills | Status: DC

## 2021-07-19 MED ORDER — CYCLOBENZAPRINE HCL 10 MG PO TABS: 10.0000 mg | ORAL_TABLET | Freq: Two times a day (BID) | ORAL | 0 refills | Status: DC | PRN

## 2021-07-19 MED ORDER — MORPHINE SULFATE (PF) 4 MG/ML IV SOLN
4.0000 mg | Freq: Once | INTRAVENOUS | Status: AC
  Administered 2021-07-19: 4 mg via INTRAMUSCULAR
  Filled 2021-07-19: qty 1

## 2021-07-19 MED ORDER — DEXAMETHASONE SODIUM PHOSPHATE 10 MG/ML IJ SOLN
10.0000 mg | Freq: Once | INTRAMUSCULAR | Status: AC
  Administered 2021-07-19: 10 mg via INTRAMUSCULAR
  Filled 2021-07-19: qty 1

## 2021-07-19 MED ORDER — LIDOCAINE 5 % EX PTCH
1.0000 | MEDICATED_PATCH | CUTANEOUS | Status: DC
  Administered 2021-07-19: 1 via TRANSDERMAL
  Filled 2021-07-19: qty 1

## 2021-07-19 MED ORDER — LIDOCAINE 5 % EX PTCH
1.0000 | MEDICATED_PATCH | CUTANEOUS | 0 refills | Status: DC
Start: 2021-07-19 — End: 2022-01-18

## 2021-07-19 NOTE — ED Triage Notes (Signed)
C/o lower back pain, LLQ pain, and vaginal irritation x 3 days with diarrhea.

## 2021-07-19 NOTE — ED Notes (Signed)
MD at bedside. 

## 2021-07-19 NOTE — ED Provider Notes (Signed)
[facility] EMERGENCY DEPARTMENT Provider Note   CSN: [id number] Arrival date & time: 07/19/21  1351     History No chief complaint on file.   Rhonda Hester is a 45 y.o. female with past medical history as listed below presenting to the ED with low back pain and LLQ pain. She states everything started 2 days ago and has been worsening. Patient had diarrhea starting 3 days ago. But she stated this is a chronic issue for her. She does not have an initiating factor for her back pain. She states it is sharp and 10/10. She took ibuprofen 600 mg BID without relieve. Movement worsens her pain and rest alleviates it. She denied nausea or vomiting. She appears agitated for having to wait a long time.        Past Medical History:  Diagnosis Date   Allergic asthma    Allergy    SEASONAL   Anxiety    Breast mass 05/2017   lt breast lump   Eczema    Fibroid    GERD (gastroesophageal reflux disease)    History of anal fissures    History of Bell's palsy    2006  RIGHT SIDE--  RESOLVED   History of gastroesophageal reflux (GERD)    History of kidney stones    Hyperlipidemia    Interstitial cystitis    Psoriasis    Sarcoidosis of lung ([place])    DX 2006--  PULMOLOGIST--  DR [id number] on 2 liters of oxygen, goes to pain clinic   Seasonal allergic rhinitis    Shortness of breath    Vaginal Pap smear, abnormal     Patient Active Problem List   Diagnosis Date Noted   Moderate episode of recurrent major depressive disorder ([place]) 05/05/2020   Dysmenorrhea 04/23/2020   Dysplasia of cervix, low grade (CIN 1) 03/23/2020   HPV (human papilloma virus) infection 03/23/2020   Abnormal Pap smear of cervix 11/12/2019   Chronic back pain 10/03/2019   Eczema 10/03/2019   GERD (gastroesophageal reflux disease) 10/03/2019   Hyperlipidemia, unspecified 10/03/2019   Iron deficiency anemia, unspecified 10/03/2019   Mild intermittent asthma without complication [id number]   Obesity  (BMI 30-39.9) 10/03/2019   Tobacco user 10/03/2019   Chronic pain syndrome 10/03/2019   Sarcoidosis of lung ([place]) 08/22/2018   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   Enlarged submental lymph node 03/30/2018   Nodule of soft tissue 03/22/2018   Hemorrhoids 02/13/2018   TMJ (temporomandibular joint disorder) 12/23/2017   Acute pain of right shoulder 11/26/2017   Chronic bilateral low back pain without sciatica 08/01/2017   Chronic pain of right knee 08/01/2017   Depression 04/02/2017   Pelvic pain 03/28/2017   Obesity (BMI 30.0-34.9) 03/19/2017   Generalized pain [id number]   Metabolic syndrome [id number]   Medication management 03/19/2017   Tobacco abuse 01/24/2017   Uterine fibroid 01/17/2017   Chronic interstitial cystitis 01/17/2017   Menorrhagia 12/06/2016   Acne vulgaris 05/10/2016   BMI 31.0-31.9,adult 05/10/2016   Weight gain 05/10/2016   Right hip pain 10/27/2014   Cervical adenopathy 03/12/2014   History of smoking 08/07/2013   Allergic asthma 07/22/2011    Past Surgical History:  Procedure Laterality Date   CYSTO WITH HYDRODISTENSION N/A 08/24/2013   Procedure: CYSTOSCOPY/HYDRODISTENSION with instillation of marcaine and pyridium;  Surgeon: Fredricka Bonine, MD;  [facility]: [facility];  Service: Urology;  Laterality: N/A;   CYSTOSCOPY/ HYDRODISTENTION/ BLADDER BX  06-09-2010   CYSTOSCOPY/URETEROSCOPY/HOLMIUM LASER/STENT  PLACEMENT Right 01/30/2019   Procedure: CYSTOSCOPY/RETROGRADE/URETEROSCOPY/HOLMIUM LASER/STENT PLACEMENT;  Surgeon: Festus Aloe, MD;  Location: [facility];  Service: Urology;  Laterality: Right;   DILATION AND CURETTAGE OF UTERUS  1997   EXTRACORPOREAL SHOCK WAVE LITHOTRIPSY Right 11/30/2018   Procedure: EXTRACORPOREAL SHOCK WAVE LITHOTRIPSY (ESWL);  Surgeon: Bjorn Loser, MD;  Location: [facility];  Service: Urology;  Laterality: Right;   IR TRANSCATHETER BX  06/14/2018   IR US GUIDE VASC ACCESS RIGHT   06/14/2018   IR VENOGRAM HEPATIC W HEMODYNAMIC EVALUATION  06/14/2018     OB History     Gravida  1   Para  0   Term  0   Preterm  0   AB  1   Living  0      SAB  1   IAB  0   Ectopic  0   Multiple  0   Live Births              Family History  Problem Relation Age of Onset   Brain cancer Maternal Grandmother    Hyperlipidemia Other    Sleep apnea Other    Depression Maternal Aunt    Seizures Maternal Uncle    Colon cancer Neg Hx    Stomach cancer Neg Hx     Social History   Tobacco Use   Smoking status: Former    Packs/day: 0.30    Years: 6.00    Pack years: 1.80    Types: Cigarettes, Cigars    Quit date: 11/2016    Years since quitting: 4.6   Smokeless tobacco: Never   Tobacco comments:    ONE CIG. DAILY /  1 PPMONTH  Vaping Use   Vaping Use: Never used  Substance Use Topics   Alcohol use: No    Alcohol/week: 0.0 standard drinks   Drug use: No    Comment: HX MARIJUANA USE    Home Medications Prior to Admission medications   Medication Sig Start Date End Date Taking? Authorizing Provider  cyclobenzaprine (FLEXERIL) 10 MG tablet Take 1 tablet (10 mg total) by mouth 2 (two) times daily as needed for muscle spasms. 07/19/21  Yes Gareth Morgan, MD  lidocaine (LIDODERM) 5 % Place 1 patch onto the skin daily. Remove & Discard patch within 12 hours or as directed by MD 07/19/21  Yes Gareth Morgan, MD  naproxen (NAPROSYN) 500 MG tablet Take 1 tablet (500 mg total) by mouth 2 (two) times daily. 07/19/21  Yes Gareth Morgan, MD  Adalimumab (HUMIRA PEN) 40 MG/0.4ML PNKT Inject into the skin. 04/22/21 05/22/21  [provider]  Adapalene (DIFFERIN) 0.3 % gel QHS to face, wash off QAM 04/15/21   Brendolyn Patty, MD  albuterol [facility] HFA) 108 (90 Base) MCG/ACT inhaler Inhale 2 puffs into the lungs every 6 (six) hours as needed for shortness of breath. 05/10/18   Dorena Dew, FNP  Cetirizine HCl 10 MG CAPS Take 1 capsule (10 mg total) by  mouth daily. 05/11/19   Wieters, Hallie C, PA-C  clindamycin (CLEOCIN-T) 1 % lotion Apply QAM to face, wash off QHS 04/15/21   Brendolyn Patty, MD  clobetasol cream (TEMOVATE) [id number] % Apply 1 application topically 2 (two) times daily. 03/11/21   Brendolyn Patty, MD  clotrimazole-betamethasone (LOTRISONE) cream Apply 1 application topically 2 (two) times daily. 03/12/21   Vevelyn Francois, NP  DULERA 200-5 MCG/ACT AERO Inhale 2 puffs into the lungs 2 (two) times daily. 03/29/20   [provider]  Fluocinolone Acetonide Body 0.01 % OIL Apply topically. 01/29/21   [provider]  folic acid (FOLVITE) 1 MG tablet Take by mouth. 12/03/20 12/03/21  [provider]  halobetasol (ULTRAVATE) 0.05 % cream Apply every night to hands and feet 03/17/21   Brendolyn Patty, MD  ibuprofen (ADVIL) 200 MG tablet Take 600-800 mg by mouth every 6 (six) hours as needed for moderate pain.    [provider]  ketoconazole (NIZORAL) 2 % shampoo Apply 1 application topically 2 (two) times a week. 03/12/21   Brendolyn Patty, MD  medroxyPROGESTERone Acetate 150 MG/ML SUSY Inject 1 mL (150 mg total) into the muscle every 3 (three) months. 02/16/21   Vevelyn Francois, NP  methotrexate (RHEUMATREX) 2.5 MG tablet Take 2.5 mg by mouth every Wednesday. Caution: Chemotherapy. Protect from light.    [provider]  metroNIDAZOLE (METROCREAM) 0.75 % cream Apply topically 2 (two) times daily. 01/29/21   [provider]  naloxone (NARCAN) nasal spray 4 mg/0.1 mL OPIOID EMERGENCY: 1 SPRAY INTO ONE NOSTRIL , MAY REPEAT 2-3 MIN UNTIL RESPONSIVE OR EMS ARRIVES. 01/14/21   [provider]  neomycin-polymyxin-hydrocortisone (CORTISPORIN) OTIC solution Place 3 drops into both ears 4 (four) times daily. 06/22/21   Vevelyn Francois, NP  ondansetron (ZOFRAN ODT) 4 MG disintegrating tablet Take 1 tablet (4 mg total) by mouth every 8 (eight) hours as needed for nausea or vomiting. 07/01/21   Fondaw, Wylder S, PA   OTEZLA 30 MG TABS Take 1 tablet by mouth 2 (two) times daily. 04/30/21   [provider]  oxyCODONE (ROXICODONE) 5 MG immediate release tablet Take 1 tablet (5 mg total) by mouth every 6 (six) hours as needed for up to 6 doses for severe pain or breakthrough pain. 07/01/21   Tedd Sias, PA  Sulfacetamide Sodium-Sulfur 10-5 % SUSP Apply topically 2 (two) times daily. 01/29/21 01/29/22  [provider]  tazarotene (AVAGE) 0.1 % cream Apply every night to hands and feet 03/17/21   Brendolyn Patty, MD  tiZANidine (ZANAFLEX) 4 MG tablet Take 4 mg by mouth 3 (three) times daily. 06/10/20   [provider]  famotidine (PEPCID) 20 MG tablet Take 1 tablet (20 mg total) by mouth 2 (two) times daily. 05/24/19 12/01/20  Lanae Boast, FNP    Allergies    Acyclovir and related, Citric acid, Dust mite extract, Grapeseed extract [nutritional supplements], Pineapple, Pollen extract, and Nickel  Review of Systems   Review of Systems  Constitutional:  Negative for appetite change, chills and diaphoresis.  HENT:  Negative for congestion, dental problem and drooling.   Eyes:  Negative for discharge and itching.  Respiratory:  Positive for cough (chronic). Negative for apnea, choking and chest tightness.   Cardiovascular:  Negative for chest pain and leg swelling.  Gastrointestinal:  Positive for abdominal pain (sharp pain radiating down the groin). Negative for abdominal distention.  Endocrine: Negative for cold intolerance and heat intolerance.  Genitourinary:  Negative for difficulty urinating, dyspareunia and dysuria.  Musculoskeletal:  Positive for back pain (lower back). Negative for arthralgias.  Neurological:  Negative for dizziness and facial asymmetry.  Hematological:  Negative for adenopathy. Does not bruise/bleed easily.  Psychiatric/Behavioral:  Negative for agitation and behavioral problems.    Physical Exam Updated Vital Signs BP (!) 139/94 (BP Location: Right Arm)    Pulse 84   Temp 98.5 F (36.9 C) (Oral)   Resp 20   LMP 06/22/2021 (Approximate)   SpO2 97%   Physical  Exam Vitals and nursing note reviewed.  Constitutional:      General: She is not in acute distress.    Appearance: She is well-developed.  HENT:     Head: Normocephalic and atraumatic.     Right Ear: External ear normal.     Left Ear: External ear normal.     Nose: Nose normal. No rhinorrhea.     Mouth/Throat:     Mouth: Mucous membranes are moist.     Pharynx: Oropharynx is clear. No posterior oropharyngeal erythema.  Eyes:     Extraocular Movements: Extraocular movements intact.     Conjunctiva/sclera: Conjunctivae normal.     Pupils: Pupils are equal, round, and reactive to light.  Cardiovascular:     Rate and Rhythm: Normal rate and regular rhythm.     Heart sounds: No murmur heard. Pulmonary:     Effort: Pulmonary effort is normal. No respiratory distress.     Breath sounds: Normal breath sounds.  Abdominal:     General: Bowel sounds are normal.     Palpations: Abdomen is soft.     Tenderness: There is no abdominal tenderness.  Musculoskeletal:        General: Tenderness (low back (center and paraspinal)) present. No swelling or signs of injury. Normal range of motion.     Cervical back: Normal range of motion and neck supple.  Skin:    General: Skin is warm and dry.     Capillary Refill: Capillary refill takes less than 2 seconds.  Neurological:     General: No focal deficit present.     Mental Status: She is alert and oriented to person, place, and time.  Psychiatric:        Mood and Affect: Mood normal.        Behavior: Behavior normal.    ED Results / Procedures / Treatments   Labs (all labs ordered are listed, but only abnormal results are displayed) Labs Reviewed  COMPREHENSIVE METABOLIC PANEL - Abnormal; Notable for the following components:      Result Value   Potassium 3.2 (*)    Glucose, Bld 125 (*)    All other components within normal limits   URINALYSIS, ROUTINE W REFLEX MICROSCOPIC - Abnormal; Notable for the following components:   Color, Urine AMBER (*)    APPearance CLOUDY (*)    Protein, ur 30 (*)    Leukocytes,Ua SMALL (*)    Bacteria, UA RARE (*)    All other components within normal limits  URINE CULTURE  CBC WITH DIFFERENTIAL/PLATELET  I-STAT BETA HCG BLOOD, ED ([facility], [facility], [facility] ONLY)    EKG None  Radiology CT Renal Stone Study  Result Date: 07/19/2021 CLINICAL DATA:  Flank pain, kidney stone suspected EXAM: CT ABDOMEN AND PELVIS WITHOUT CONTRAST TECHNIQUE: Multidetector CT imaging of the abdomen and pelvis was performed following the standard protocol without IV contrast. COMPARISON:  December 14, 2018 FINDINGS: Evaluation is limited secondary to lack of IV contrast. Lower chest: Similar appearance of platelike RIGHT lower lobe opacities consistent with reported history of sarcoidosis. Hepatobiliary: Unremarkable noncontrast appearance of the liver and gallbladder. Pancreas: No peripancreatic fat stranding. Spleen: Decreased size of the lobulated spleen in comparison to prior. Adrenals/Urinary Tract: Adrenal glands are unremarkable. No hydronephrosis. Nonobstructing nephrolithiasis in the inferior pole of the RIGHT kidney, unchanged. Unchanged punctate calcification along the anterior and inferior aspect of the bladder, nonspecific. Stomach/Bowel: Stomach is within normal limits. Appendix appears normal. No evidence of bowel wall thickening, distention, or inflammatory  changes. Vascular/Lymphatic: Mild atherosclerotic calcifications. No suspicious lymphadenopathy. Reproductive: Fibroid uterus.  Adnexa are unremarkable. Other: No abdominal wall hernia or abnormality. No abdominopelvic ascites. Musculoskeletal: No acute or significant osseous findings. IMPRESSION: No CT etiology for acute abdominal pain identified. Aortic Atherosclerosis (ICD10-I70.0). Electronically Signed   By: Valentino Saxon MD   On: 07/19/2021 16:20     Procedures Procedures   Medications Ordered in ED Medications  lidocaine (LIDODERM) 5 % 1 patch (1 patch Transdermal Patch Applied 07/19/21 2019)  ketorolac (TORADOL) injection 60 mg (60 mg Intramuscular Given 07/19/21 1941)  morphine 4 MG/ML injection 4 mg (4 mg Intramuscular Given 07/19/21 1940)  dexamethasone (DECADRON) injection 10 mg (10 mg Intramuscular Given 07/19/21 2013)    ED Course  I have reviewed the triage vital signs and the nursing notes.  Pertinent labs & imaging results that were available during my care of the patient were reviewed by me and considered in my medical decision making (see chart for details).    MDM Rules/Calculators/A&P                           Back Pain/LLQ pain Patient has back pain and LLQ pain that is 26 days old. She has diarrhea that is 43 days old but chronic in nature. Ddx include kidney stone vs diverticulitis vs muscle strain vs aortic dissection vs pyelonephritis vs ovarian torsion. Most of the differential was ruled out based on negative CT as they would show up on CT. Muscle strain seems most likely diagnosis as pain worsens with movement and palpation. Patient denies any burning upon urination. LLQ not tender to palpation but has intermittent sharp pain. No nausea or vomiting to suggest ovarian torsion or severe pain in the LLQ.   Plan: -Ketorolac injection -IM morphine '4mg'$  -Lidoerm patch 5% -Discharge with flexeril and Lidoderm patches -Outpatient follow up   Final Clinical Impression(s) / ED Diagnoses Final diagnoses:  Acute left-sided low back pain without sciatica    Rx / DC Orders ED Discharge Orders          Ordered    cyclobenzaprine (FLEXERIL) 10 MG tablet  2 times daily PRN        07/19/21 2032    naproxen (NAPROSYN) 500 MG tablet  2 times daily        07/19/21 2032    lidocaine (LIDODERM) 5 %  Every 24 hours        07/19/21 2032             Idamae Schuller, MD 07/21/21 2014    Gareth Morgan, MD 07/23/21  1101

## 2021-07-19 NOTE — ED Notes (Signed)
Pt c/o Lower back pain and foot pain without injury.

## 2021-07-19 NOTE — ED Provider Notes (Signed)
Emergency Medicine Provider Triage Evaluation Note  Rhonda Hester , a 45 y.o. female  was evaluated in triage.  Pt complains of back pain and left lower quadrant abdominal pain.  Similar to when she had kidney stones before.  Also recent history of pain from fibroids.  No trauma or injury.  Review of Systems  Positive: Back and LLQ abd pain Negative: fever  Physical Exam  BP 126/85   Pulse 92   Temp 98.7 F (37.1 C) (Oral)   Resp 16   LMP 06/22/2021 (Approximate)   SpO2 97%  Gen:   Awake, no distress   Resp:  Normal effort  MSK:   Moves extremities without difficulty Other:  Tender palpation of left low back musculature and over the left CVA.  No tenderness palpation of the anterior abdomen.  Medical Decision Making  Medically screening exam initiated at 3:00 PM.  Appropriate orders placed.  Rhonda Hester was informed that the remainder of the evaluation will be completed by another provider, this initial triage assessment does not replace that evaluation, and the importance of remaining in the ED until their evaluation is complete.  UA, Labs and ct renal   Rhonda Heidelberg, PA-C 07/19/21 1500    [place], [place] 07/21/21 [phone]

## 2021-07-20 ENCOUNTER — Other Ambulatory Visit: Payer: Self-pay

## 2021-07-20 ENCOUNTER — Ambulatory Visit (INDEPENDENT_AMBULATORY_CARE_PROVIDER_SITE_OTHER): Payer: Medicare Other | Admitting: Nurse Practitioner

## 2021-07-20 DIAGNOSIS — Z3042 Encounter for surveillance of injectable contraceptive: Secondary | ICD-10-CM

## 2021-07-20 MED ORDER — MEDROXYPROGESTERONE ACETATE 150 MG/ML IM SUSP
150.0000 mg | Freq: Once | INTRAMUSCULAR | Status: AC
  Administered 2021-07-20: 150 mg via INTRAMUSCULAR

## 2021-07-20 NOTE — Progress Notes (Signed)
Patient came in for depo injection. Patient tolerated well.

## 2021-07-22 LAB — URINE CULTURE: Culture: 50000 — AB

## 2021-07-23 ENCOUNTER — Telehealth: Payer: Self-pay

## 2021-07-23 ENCOUNTER — Telehealth: Payer: Self-pay | Admitting: *Deleted

## 2021-07-23 NOTE — Telephone Encounter (Signed)
Post ED Visit - Positive Culture Follow-up  Culture report reviewed by antimicrobial stewardship pharmacist: [place] Team '[]'$  Elenor Quinones, Pharm.D. '[]'$  Heide Guile, Pharm.D., BCPS AQ-ID '[]'$  Parks Neptune, Pharm.D., BCPS '[]'$  Alycia Rossetti, Pharm.D., BCPS '[]'$  [place], [place].D., BCPS, AAHIVP '[]'$  Legrand Como, Pharm.D., BCPS, AAHIVP '[]'$  Salome Arnt, PharmD, BCPS '[]'$  Johnnette Gourd, PharmD, BCPS '[]'$  Hughes Better, PharmD, BCPS '[]'$  Leeroy Cha, PharmD '[]'$  Laqueta Linden, PharmD, BCPS '[]'$  Albertina Parr, PharmD  [place] Team '[]'$  Leodis Sias, PharmD '[]'$  Lindell Spar, PharmD '[]'$  Royetta Asal, PharmD '[]'$  Graylin Shiver, Rph '[]'$  Rema Fendt) Glennon Mac, PharmD '[]'$  Arlyn Dunning, PharmD '[]'$  Netta Cedars, PharmD '[]'$  Dia Sitter, PharmD '[]'$  Leone Haven, PharmD '[]'$  Gretta Arab, PharmD '[]'$  Theodis Shove, PharmD '[]'$  Peggyann Juba, PharmD '[]'$  Reuel Boom, PharmD   Positive urine culture Rare bacteria and no further patient follow-up is required at this time. Joetta Manners, PharmD  Harlon Flor Talley 07/23/2021, 12:55 PM

## 2021-07-23 NOTE — Telephone Encounter (Signed)
Med Refill on for Private area  [place] [facility] [address]

## 2021-07-27 ENCOUNTER — Other Ambulatory Visit: Payer: Self-pay | Admitting: Nurse Practitioner

## 2021-07-27 DIAGNOSIS — L309 Dermatitis, unspecified: Secondary | ICD-10-CM | POA: Diagnosis not present

## 2021-07-27 DIAGNOSIS — R102 Pelvic and perineal pain: Secondary | ICD-10-CM

## 2021-07-28 ENCOUNTER — Telehealth: Payer: Self-pay

## 2021-07-28 NOTE — Telephone Encounter (Signed)
Private area [place] on [address]

## 2021-07-29 ENCOUNTER — Other Ambulatory Visit: Payer: Self-pay | Admitting: Nurse Practitioner

## 2021-07-29 ENCOUNTER — Ambulatory Visit
Admission: RE | Admit: 2021-07-29 | Discharge: 2021-07-29 | Disposition: A | Discharge status: Home / Self Care | Payer: Medicare Other | Source: Ambulatory Visit | Attending: Nurse Practitioner | Admitting: Nurse Practitioner

## 2021-07-29 ENCOUNTER — Other Ambulatory Visit: Payer: Self-pay

## 2021-07-29 DIAGNOSIS — R922 Inconclusive mammogram: Secondary | ICD-10-CM | POA: Diagnosis not present

## 2021-07-29 DIAGNOSIS — N6320 Unspecified lump in the left breast, unspecified quadrant: Secondary | ICD-10-CM

## 2021-07-29 MED ORDER — FLUCONAZOLE 150 MG PO TABS: 150.0000 mg | ORAL_TABLET | Freq: Once | ORAL | 0 refills | Status: AC

## 2021-07-29 NOTE — Progress Notes (Signed)
   [facility] [address] [place], [place]  [address] Phone:  [phone]   Fax:  [phone] 

## 2021-07-29 NOTE — Telephone Encounter (Signed)
Patient notified

## 2021-07-30 ENCOUNTER — Ambulatory Visit
Admission: RE | Admit: 2021-07-30 | Discharge: 2021-07-30 | Disposition: A | Discharge status: Home / Self Care | Payer: Medicare Other | Source: Ambulatory Visit | Attending: Nurse Practitioner | Admitting: Nurse Practitioner

## 2021-07-30 DIAGNOSIS — R1032 Left lower quadrant pain: Secondary | ICD-10-CM | POA: Diagnosis not present

## 2021-07-30 DIAGNOSIS — R102 Pelvic and perineal pain: Secondary | ICD-10-CM

## 2021-08-13 ENCOUNTER — Ambulatory Visit (INDEPENDENT_AMBULATORY_CARE_PROVIDER_SITE_OTHER): Payer: Medicare Other | Admitting: Obstetrics and Gynecology

## 2021-08-13 ENCOUNTER — Other Ambulatory Visit (HOSPITAL_COMMUNITY)
Admission: RE | Admit: 2021-08-13 | Discharge: 2021-08-13 | Disposition: A | Discharge status: Home / Self Care | Payer: Medicare Other | Source: Ambulatory Visit | Attending: Obstetrics and Gynecology | Admitting: Obstetrics and Gynecology

## 2021-08-13 ENCOUNTER — Encounter: Payer: Self-pay | Admitting: Obstetrics and Gynecology

## 2021-08-13 ENCOUNTER — Other Ambulatory Visit: Payer: Self-pay

## 2021-08-13 DIAGNOSIS — R87610 Atypical squamous cells of undetermined significance on cytologic smear of cervix (ASC-US): Secondary | ICD-10-CM | POA: Diagnosis present

## 2021-08-13 DIAGNOSIS — D860 Sarcoidosis of lung: Secondary | ICD-10-CM | POA: Diagnosis not present

## 2021-08-13 DIAGNOSIS — R87810 Cervical high risk human papillomavirus (HPV) DNA test positive: Secondary | ICD-10-CM | POA: Diagnosis present

## 2021-08-13 NOTE — Patient Instructions (Signed)
Colposcopy, Care After This sheet gives you information about how to care for yourself after your procedure. Your doctor may also give you more specific instructions. If youhave problems or questions, contact your doctor. What can I expect after the procedure? If you did not have a sample of your tissue taken out (did not have a biopsy), you may only have some spotting of blood for a few days. You can go back toyour normal activities. If you had a sample of your tissue taken out, it is common to have: Soreness and mild pain. These may last for a few days. A light-headed feeling. Mild bleeding or fluid (discharge) coming from your vagina. The fluid will look dark and grainy. You may have this for a few days. The fluid may be caused by a liquid that was used during your procedure. You may need to wear a sanitary pad. Spotting of blood for at least 48 hours after the procedure. Follow these instructions at home: Medicines Take over-the-counter and prescription medicines only as told by your doctor. Ask your doctor what medicines you can start taking again. This is very important if you take blood thinners. Activity Limit your activity for the first day after your procedure as told by your doctor. For at least 3 days, or for as long as told by your doctor, avoid: Douching. Using tampons. Having sex. Return to your normal activities as told by your doctor. Ask your doctor what activities are safe for you. General instructions  Drink enough fluid to keep your pee (urine) pale yellow. Ask your doctor if you may take baths, swim, or use a hot tub. You may take showers. If you use birth control (contraception), keep using it. Keep all follow-up visits as told by your doctor. This is important.  Contact a doctor if: You get a skin rash. Get help right away if: You bleed a lot from your vagina. A lot of bleeding means you use more than one pad an hour for 2 hours in a row. You have clumps of  blood (blood clots) coming from your vagina. You have a fever or chills. You have signs of infection. This may be fluid coming from your vagina that is: Different than normal. Yellow. Bad-smelling. You have very bad pain or cramps in your lower belly that do not get better with medicine. You faint. Summary If you did not have a sample of your tissue taken out, you may only have some spotting of blood for a few days. You can go back to your normal activities. If you had a sample of your tissue taken out, it is common to have mild pain for a few days and spotting for 48 hours. Avoid douching, using tampons, and having sex for at least 3 days after the procedure or for as long as told. Get help right away if you have a lot of bleeding, very bad pain, or signs of infection. This information is not intended to replace advice given to you by your health care provider. Make sure you discuss any questions you have with your healthcare provider. Document Revised: 10/14/2020 Document Reviewed: 12/12/2019 Elsevier Patient Education  2022 Elsevier Inc.  

## 2021-08-13 NOTE — Progress Notes (Signed)
    GYNECOLOGY CLINIC COLPOSCOPY PROCEDURE NOTE  45 y.o. G1P0010 here for colposcopy for ASCUS with POSITIVE high risk HPV pap smear on 06/2021. Discussed role for HPV in cervical dysplasia, need for surveillance.  Patient given informed consent, signed copy in the chart, time out was performed.  Placed in lithotomy position. Cervix viewed with speculum and colposcope after application of acetic acid.   Colposcopy adequate? Yes  acetowhite lesion(s) noted at 12 & 6  o'clock; corresponding biopsies obtained.  ECC specimen obtained. Monsel's applied.  All specimens were labelled and sent to pathology.  Patient was given post procedure instructions.  Will follow up pathology and manage accordingly.  Routine preventative health maintenance measures emphasized.  Chaperone present during entire procedure  Arlina Robes MD, [place] Attending [place] for [facility], [place]

## 2021-08-13 NOTE — Progress Notes (Signed)
Abnormal PAP, here for colposcopy. Patient states she has not been counseled about the procedure.

## 2021-08-14 LAB — SURGICAL PATHOLOGY

## 2021-08-18 DIAGNOSIS — R519 Headache, unspecified: Secondary | ICD-10-CM | POA: Diagnosis not present

## 2021-08-18 DIAGNOSIS — M5431 Sciatica, right side: Secondary | ICD-10-CM | POA: Diagnosis not present

## 2021-08-18 DIAGNOSIS — L409 Psoriasis, unspecified: Secondary | ICD-10-CM | POA: Diagnosis not present

## 2021-08-18 DIAGNOSIS — G894 Chronic pain syndrome: Secondary | ICD-10-CM | POA: Diagnosis not present

## 2021-08-18 DIAGNOSIS — Z79891 Long term (current) use of opiate analgesic: Secondary | ICD-10-CM | POA: Diagnosis not present

## 2021-08-18 DIAGNOSIS — M25551 Pain in right hip: Secondary | ICD-10-CM | POA: Diagnosis not present

## 2021-08-18 DIAGNOSIS — M79671 Pain in right foot: Secondary | ICD-10-CM | POA: Diagnosis not present

## 2021-08-18 DIAGNOSIS — M545 Low back pain, unspecified: Secondary | ICD-10-CM | POA: Diagnosis not present

## 2021-08-18 DIAGNOSIS — M25562 Pain in left knee: Secondary | ICD-10-CM | POA: Diagnosis not present

## 2021-09-13 DIAGNOSIS — D860 Sarcoidosis of lung: Secondary | ICD-10-CM | POA: Diagnosis not present

## 2021-09-15 ENCOUNTER — Ambulatory Visit (INDEPENDENT_AMBULATORY_CARE_PROVIDER_SITE_OTHER): Payer: Medicare Other | Admitting: Dermatology

## 2021-09-15 ENCOUNTER — Telehealth: Payer: Self-pay | Admitting: Nurse Practitioner

## 2021-09-15 ENCOUNTER — Other Ambulatory Visit: Payer: Self-pay

## 2021-09-15 DIAGNOSIS — L700 Acne vulgaris: Secondary | ICD-10-CM | POA: Diagnosis not present

## 2021-09-15 DIAGNOSIS — L409 Psoriasis, unspecified: Secondary | ICD-10-CM | POA: Diagnosis not present

## 2021-09-15 MED ORDER — DOXYCYCLINE MONOHYDRATE 100 MG PO CAPS: 100.0000 mg | ORAL_CAPSULE | Freq: Two times a day (BID) | ORAL | 3 refills | Status: DC

## 2021-09-15 MED ORDER — ADAPALENE 0.3 % EX GEL: CUTANEOUS | 3 refills | Status: DC

## 2021-09-15 MED ORDER — CLINDAMYCIN PHOSPHATE 1 % EX LOTN: TOPICAL_LOTION | CUTANEOUS | 3 refills | Status: DC

## 2021-09-15 NOTE — Progress Notes (Signed)
Follow-Up Visit   Subjective  Rhonda Hester is a 45 y.o. female who presents for the following: Follow-up (Patient with systemic sarcoid on Humira, here today for follow up on acne and psoriasis. She reports her psoriasis is doing much worse today at hands, feet, and fingernails. She reports new areas at legs, back, behind ears and inside ears. States she tried duobrii lotion that did not help. She reports rheumatology switched her remicade infusions to humira injections. She has also taken MTX. She states she stopped taking Humira injections 6 weeks ago since her psoriasis began to get worse.Marland Kitchen ) and Acne (Patient reports acne has not changed. She reports new breakouts. Patient was prescribed adapalene to use nightly and clindamycin lotion to use in am. Patient reports she did not start adapalene and was confused of what medication to use at face. ). Pt states she gets pustules on her hands and feet, after each Humira dose, which she has stopped. Her father has psoriasis.  She has stopped taking the MTX as well about a month ago because she was worried about liver damage.  The following portions of the chart were reviewed this encounter and updated as appropriate:      Review of Systems: No other skin or systemic complaints except as noted in HPI or Assessment and Plan.   Objective  Well appearing patient in no apparent distress; mood and affect are within normal limits.  A focused examination was performed including face, chest, back, arms, hands, feet, legs. Relevant physical exam findings are noted in the Assessment and Plan.  hands, fingernails, feet, ears Well demarcated erythema with hyperpigmentation and hyperkeratosis at bilateral palms and dystrophy of fingers nails including nail pitting, erythema with hyperpigmentation and hyperkeratosis bilateral medial foot and plantar foot, post auricular and ear concha has hyperpigmented scaly papules    face Multiple hyperpigmented papules  and inflamed comedons at face and chin  Assessment & Plan  Psoriasis hands, fingernails, feet, ears  Palmarplantar psoriasis- flare with TNF alpha inhibitor (Humira, Remicade) prescribed to treat sarcoidosis  Patient tried and failed duobri, clobetasol cream, halobetasol, Otezla (unable to tolerate GI), tazarotene 0.1 % cream   She should restart the MTX 15 mg PO qwk  (2.5 mg tabs x 6) as prescribed by the rheumatologist, and folic acid every other day, except day of MTX.  Reassured patient that blood testing will be done to monitor for any liver inflammation.  Also discussed with pt that the MTX can help clear up the psoriasis as well as treat sarcoid.  Will treat with topically for now to see if patient's condition improves now that she is off of Humira. It could take a few months to improve/clear. Start Vtama cream (samples given) apply to face, body, hands and feet in morning.  Start Winzora cream - apply to affected areas of feet and hands nightly  Recommend starting Xtrac laser treatments 2 times weekly, will take several months to see if effective  If patient's condition is unchanged off of Humira at follow up, will consider starting Skyrizi injections.  Recommend follow up with rheumatology for sarcoidosis treatment and see if a biologic for psoriasis would be an option if not improving.      Related Medications clobetasol cream (TEMOVATE) 6.65 % Apply 1 application topically 2 (two) times daily.  ketoconazole (NIZORAL) 2 % shampoo Apply 1 application topically 2 (two) times a week.  Acne vulgaris face  Moderate/severe flare  Start Doxycycline 100 mg PO bid with food  Recommend restart Clindamycin 1 % lotion apply to face in morning for acne Start Adapalene 0.3 % gel apply pea sized amount to face nightly for acne as tolerated.   Topical retinoid medications like tretinoin/Retin-A, adapalene/Differin, tazarotene/Fabior, and Epiduo/Epiduo Forte can cause dryness and  irritation when first started. Only apply a pea-sized amount to the entire affected area. Avoid applying it around the eyes, edges of mouth and creases at the nose. If you experience irritation, use a good moisturizer first and/or apply the medicine less often. If you are doing well with the medicine, you can increase how often you use it until you are applying every night. Be careful with sun protection while using this medication as it can make you sensitive to the sun. This medicine should not be used by pregnant women.   Doxycycline should be taken with food to prevent nausea. Do not lay down for 30 minutes after taking. Be cautious with sun exposure and use good sun protection while on this medication. Pregnant women should not take this medication.     doxycycline (MONODOX) 100 MG capsule - face Take 1 capsule (100 mg total) by mouth 2 (two) times daily. Take with food  Adapalene (DIFFERIN) 0.3 % gel - face Apply pea sized amount  to face nightly for acne as tolerated  clindamycin (CLEOCIN-T) 1 % lotion - face Apply in morning to face for acne  Return for schedule for xtrac and follow up 1 month psoriasis follow up with Dr. Nicole Kindred .  I, Ruthell Rummage, CMA, am acting as scribe for Brendolyn Patty, MD.  Documentation: I have reviewed the above documentation for accuracy and completeness, and I agree with the above.  Brendolyn Patty MD

## 2021-09-15 NOTE — Telephone Encounter (Signed)
Left message for patient to call back and schedule Medicare Annual Wellness Visit   awvi 06/26/10 per [facility]   please schedule at anytime with health coach

## 2021-09-15 NOTE — Patient Instructions (Addendum)
Consider starting skyrizi for psoriasis at hands let rheumatologist know we are considering adding this medication to treat.   Vtama  apply cream in morning face, body , hands, and feet   Winzora apply cream nightly to hands and feet   Adapalene apply gel to face nightly as tolerated for acne  Clindamycin lotion - apply lotion to face in morning for acne    Doxycycline should be taken with food to prevent nausea. Do not lay down for 30 minutes after taking. Be cautious with sun exposure and use good sun protection while on this medication. Pregnant women should not take this medication.    Topical retinoid medications like tretinoin/Retin-A, adapalene/Differin, tazarotene/Fabior, and Epiduo/Epiduo Forte can cause dryness and irritation when first started. Only apply a pea-sized amount to the entire affected area. Avoid applying it around the eyes, edges of mouth and creases at the nose. If you experience irritation, use a good moisturizer first and/or apply the medicine less often. If you are doing well with the medicine, you can increase how often you use it until you are applying every night. Be careful with sun protection while using this medication as it can make you sensitive to the sun. This medicine should not be used by pregnant women.    If you have any questions or concerns for your doctor, please call our main line at [phone] and press option 4 to reach your doctor's medical assistant. If no one answers, please leave a voicemail as directed and we will return your call as soon as possible. Messages left after 4 pm will be answered the following business day.   You may also send [place] a message via [place]. We typically respond to MyChart messages within 1-2 business days.  For prescription refills, please ask your pharmacy to contact our office. Our fax number is [phone].  If you have an urgent issue when the clinic is closed that cannot wait until the next business day, you  can page your doctor at the number below.    Please note that while we do our best to be available for urgent issues outside of office hours, we are not available 24/7.   If you have an urgent issue and are unable to reach [place], you may choose to seek medical care at your doctor's office, retail clinic, urgent care center, or emergency room.  If you have a medical emergency, please immediately call 911 or go to the emergency department.  Pager Numbers  - Dr. Nehemiah Massed: [phone]  - Dr. Laurence Ferrari: [phone]  - Dr. Nicole Kindred: [phone]  In the event of inclement weather, please call our main line at [phone] for an update on the status of any delays or closures.  Dermatology Medication Tips: Please keep the boxes that topical medications come in in order to help keep track of the instructions about where and how to use these. Pharmacies typically print the medication instructions only on the boxes and not directly on the medication tubes.   If your medication is too expensive, please contact our office at [phone] option 4 or send [place] a message through [place].   We are unable to tell what your co-pay for medications will be in advance as this is different depending on your insurance coverage. However, we may be able to find a substitute medication at lower cost or fill out paperwork to get insurance to cover a needed medication.   If a prior authorization is required to get your medication covered by your  insurance company, please allow [place] 1-2 business days to complete this process.  Drug prices often vary depending on where the prescription is filled and some pharmacies may offer cheaper prices.  The website www.goodrx.com contains coupons for medications through different pharmacies. The prices here do not account for what the cost may be with help from insurance (it may be cheaper with your insurance), but the website can give you the price if you did not use any insurance.  -  You can print the associated coupon and take it with your prescription to the pharmacy.  - You may also stop by our office during regular business hours and pick up a GoodRx coupon card.  - If you need your prescription sent electronically to a different pharmacy, notify our office through [facility] or by phone at [phone] option 4.

## 2021-09-16 ENCOUNTER — Ambulatory Visit (INDEPENDENT_AMBULATORY_CARE_PROVIDER_SITE_OTHER): Payer: Medicare Other

## 2021-09-16 DIAGNOSIS — Z0000 Encounter for general adult medical examination without abnormal findings: Secondary | ICD-10-CM | POA: Diagnosis not present

## 2021-09-16 MED ORDER — VTAMA 1 % EX CREA: 1.0000 "application " | TOPICAL_CREAM | CUTANEOUS | 3 refills | Status: DC

## 2021-09-16 MED ORDER — WYNZORA 0.005-0.064 % EX CREA: 1.0000 "application " | TOPICAL_CREAM | CUTANEOUS | 3 refills | Status: DC

## 2021-09-16 NOTE — Patient Instructions (Signed)
Health Maintenance, Female Adopting a healthy lifestyle and getting preventive care are important in promoting health and wellness. Ask your health care provider about: The right schedule for you to have regular tests and exams. Things you can do on your own to prevent diseases and keep yourself healthy. What should I know about diet, weight, and exercise? Eat a healthy diet  Eat a diet that includes plenty of vegetables, fruits, low-fat dairy products, and lean protein. Do not eat a lot of foods that are high in solid fats, added sugars, or sodium. Maintain a healthy weight Body mass index (BMI) is used to identify weight problems. It estimates body fat based on height and weight. Your health care provider can help determine your BMI and help you achieve or maintain a healthy weight. Get regular exercise Get regular exercise. This is one of the most important things you can do for your health. Most adults should: Exercise for at least 150 minutes each week. The exercise should increase your heart rate and make you sweat (moderate-intensity exercise). Do strengthening exercises at least twice a week. This is in addition to the moderate-intensity exercise. Spend less time sitting. Even light physical activity can be beneficial. Watch cholesterol and blood lipids Have your blood tested for lipids and cholesterol at 45 years of age, then have this test every 5 years. Have your cholesterol levels checked more often if: Your lipid or cholesterol levels are high. You are older than 45 years of age. You are at high risk for heart disease. What should I know about cancer screening? Depending on your health history and family history, you may need to have cancer screening at various ages. This may include screening for: Breast cancer. Cervical cancer. Colorectal cancer. Skin cancer. Lung cancer. What should I know about heart disease, diabetes, and high blood pressure? Blood pressure and heart  disease High blood pressure causes heart disease and increases the risk of stroke. This is more likely to develop in people who have high blood pressure readings, are of African descent, or are overweight. Have your blood pressure checked: Every 3-5 years if you are 18-39 years of age. Every year if you are 40 years old or older. Diabetes Have regular diabetes screenings. This checks your fasting blood sugar level. Have the screening done: Once every three years after age 40 if you are at a normal weight and have a low risk for diabetes. More often and at a younger age if you are overweight or have a high risk for diabetes. What should I know about preventing infection? Hepatitis B If you have a higher risk for hepatitis B, you should be screened for this virus. Talk with your health care provider to find out if you are at risk for hepatitis B infection. Hepatitis C Testing is recommended for: Everyone born from 1945 through 1965. Anyone with known risk factors for hepatitis C. Sexually transmitted infections (STIs) Get screened for STIs, including gonorrhea and chlamydia, if: You are sexually active and are younger than 45 years of age. You are older than 45 years of age and your health care provider tells you that you are at risk for this type of infection. Your sexual activity has changed since you were last screened, and you are at increased risk for chlamydia or gonorrhea. Ask your health care provider if you are at risk. Ask your health care provider about whether you are at high risk for HIV. Your health care provider may recommend a prescription medicine   to help prevent HIV infection. If you choose to take medicine to prevent HIV, you should first get tested for HIV. You should then be tested every 3 months for as long as you are taking the medicine. Pregnancy If you are about to stop having your period (premenopausal) and you may become pregnant, seek counseling before you get  pregnant. Take 400 to 800 micrograms (mcg) of folic acid every day if you become pregnant. Ask for birth control (contraception) if you want to prevent pregnancy. Osteoporosis and menopause Osteoporosis is a disease in which the bones lose minerals and strength with aging. This can result in bone fractures. If you are 65 years old or older, or if you are at risk for osteoporosis and fractures, ask your health care provider if you should: Be screened for bone loss. Take a calcium or vitamin D supplement to lower your risk of fractures. Be given hormone replacement therapy (HRT) to treat symptoms of menopause. Follow these instructions at home: Lifestyle Do not use any products that contain nicotine or tobacco, such as cigarettes, e-cigarettes, and chewing tobacco. If you need help quitting, ask your health care provider. Do not use street drugs. Do not share needles. Ask your health care provider for help if you need support or information about quitting drugs. Alcohol use Do not drink alcohol if: Your health care provider tells you not to drink. You are pregnant, may be pregnant, or are planning to become pregnant. If you drink alcohol: Limit how much you use to 0-1 drink a day. Limit intake if you are breastfeeding. Be aware of how much alcohol is in your drink. In the [place] one drink equals one 12 oz bottle of beer (355 mL), one 5 oz glass of wine (148 mL), or one 1 oz glass of hard liquor (44 mL). General instructions Schedule regular health, dental, and eye exams. Stay current with your vaccines. Tell your health care provider if: You often feel depressed. You have ever been abused or do not feel safe at home. Summary Adopting a healthy lifestyle and getting preventive care are important in promoting health and wellness. Follow your health care provider's instructions about healthy diet, exercising, and getting tested or screened for diseases. Follow your health care provider's  instructions on monitoring your cholesterol and blood pressure. This information is not intended to replace advice given to you by your health care provider. Make sure you discuss any questions you have with your health care provider. Document Revised: 02/20/2021 Document Reviewed: 12/06/2018 [employer] Patient Education  2022 [employer]  

## 2021-09-16 NOTE — Progress Notes (Signed)
Subjective:   Laquida Cotrell is a 45 y.o. female who presents for Medicare Annual (Subsequent) preventive examination.  I connected with  Suzy Kugel on 09/16/21 by audio enabled telemedicine application and verified that I am speaking with the correct person using two identifiers.   I discussed the limitations of evaluation and management by telemedicine. The patient expressed understanding and agreed to proceed.   Location of Patient: Home Location of Provider: Office   List any persons and their roles That are participating in the visit with the patient. Lavell Luster, LPN    Ms. Ding , Thank you for taking time to come for your Medicare Wellness Visit. I appreciate your ongoing commitment to your health goals. Please review the following plan we discussed and let me know if I can assist you in the future.   These are the goals we discussed:  Goals      Gain weight        This is a list of the screening recommended for you and due dates:  Health Maintenance  Topic Date Due   COVID-19 Vaccine (1) Never done   Flu Shot  07/27/2021   Mammogram  11/11/2021   Colon Cancer Screening  09/22/2023   Pap Smear  07/07/2024   Tetanus Vaccine  05/10/2026   Hepatitis C Screening: USPSTF Recommendation to screen - Ages 18-79 yo.  Completed   HIV Screening  Completed   HPV Vaccine  Aged Out    Review of Systems    Defer to PCP       Objective:    There were no vitals filed for this visit. There is no height or weight on file to calculate BMI.  Advanced Directives 07/01/2021 11/30/2020 01/30/2019 01/29/2019 11/30/2018 11/11/2018 07/26/2018  Does Patient Have a Medical Advance Directive? No No No No No No No  Would patient like information on creating a medical advance directive? - No - Patient declined No - Patient declined - No - Patient declined - No - Patient declined  Pre-existing out of facility DNR order (yellow form or pink MOST form) - - - - - - -  Some  encounter information is confidential and restricted. Go to Review Flowsheets activity to see all data.    Current Medications (verified) Outpatient Encounter Medications as of 09/16/2021  Medication Sig   Adalimumab (HUMIRA PEN) 40 MG/0.4ML PNKT Inject into the skin.   Adapalene (DIFFERIN) 0.3 % gel Apply pea sized amount  to face nightly for acne as tolerated   albuterol (PROAIR HFA) 108 (90 Base) MCG/ACT inhaler Inhale 2 puffs into the lungs every 6 (six) hours as needed for shortness of breath.   Calcipotriene-Betameth Diprop (WYNZORA) 0.005-0.064 % CREA Apply 1 application topically See admin instructions. Apply topically to affected areas of feet and hands nightly   Cetirizine HCl 10 MG CAPS Take 1 capsule (10 mg total) by mouth daily.   clindamycin (CLEOCIN-T) 1 % lotion Apply in morning to face for acne   clobetasol cream (TEMOVATE) 6.19 % Apply 1 application topically 2 (two) times daily.   clotrimazole-betamethasone (LOTRISONE) cream Apply 1 application topically 2 (two) times daily.   cyclobenzaprine (FLEXERIL) 10 MG tablet Take 1 tablet (10 mg total) by mouth 2 (two) times daily as needed for muscle spasms.   doxycycline (MONODOX) 100 MG capsule Take 1 capsule (100 mg total) by mouth 2 (two) times daily. Take with food   DULERA 200-5 MCG/ACT AERO Inhale 2 puffs into the lungs 2 (two)  times daily.   Fluocinolone Acetonide Body 0.01 % OIL Apply topically.   folic acid (FOLVITE) 1 MG tablet Take by mouth.   ibuprofen (ADVIL) 200 MG tablet Take 600-800 mg by mouth every 6 (six) hours as needed for moderate pain.   ketoconazole (NIZORAL) 2 % shampoo Apply 1 application topically 2 (two) times a week.   lidocaine (LIDODERM) 5 % Place 1 patch onto the skin daily. Remove & Discard patch within 12 hours or as directed by MD   medroxyPROGESTERone Acetate 150 MG/ML SUSY Inject 1 mL (150 mg total) into the muscle every 3 (three) months.   methotrexate (RHEUMATREX) 2.5 MG tablet Take 2.5 mg by  mouth every Wednesday. Caution: Chemotherapy. Protect from light.   metroNIDAZOLE (METROCREAM) 0.75 % cream Apply topically 2 (two) times daily.   naloxone (NARCAN) nasal spray 4 mg/0.1 mL OPIOID EMERGENCY: 1 SPRAY INTO ONE NOSTRIL , MAY REPEAT 2-3 MIN UNTIL RESPONSIVE OR EMS ARRIVES.   naproxen (NAPROSYN) 500 MG tablet Take 1 tablet (500 mg total) by mouth 2 (two) times daily.   neomycin-polymyxin-hydrocortisone (CORTISPORIN) OTIC solution Place 3 drops into both ears 4 (four) times daily.   ondansetron (ZOFRAN ODT) 4 MG disintegrating tablet Take 1 tablet (4 mg total) by mouth every 8 (eight) hours as needed for nausea or vomiting.   OTEZLA 30 MG TABS Take 1 tablet by mouth 2 (two) times daily.   oxyCODONE (ROXICODONE) 5 MG immediate release tablet Take 1 tablet (5 mg total) by mouth every 6 (six) hours as needed for up to 6 doses for severe pain or breakthrough pain.   Sulfacetamide Sodium-Sulfur 10-5 % SUSP Apply topically 2 (two) times daily.   Tapinarof (VTAMA) 1 % CREA Apply 1 application topically See admin instructions. Apply topicially to face, body, hands and feet in morning   tiZANidine (ZANAFLEX) 4 MG tablet Take 4 mg by mouth 3 (three) times daily.   [DISCONTINUED] famotidine (PEPCID) 20 MG tablet Take 1 tablet (20 mg total) by mouth 2 (two) times daily.   Facility-Administered Encounter Medications as of 09/16/2021  Medication   bupivacaine (MARCAINE) 0.5 % 15 mL, phenazopyridine (PYRIDIUM) 400 mg bladder mixture    Allergies (verified) Acyclovir and related, Citric acid, Dust mite extract, Grapeseed extract [nutritional supplements], Pineapple, Pollen extract, and Nickel   History: Past Medical History:  Diagnosis Date   Allergic asthma    Allergy    SEASONAL   Anxiety    Breast mass 05/2017   lt breast lump   Eczema    Fibroid    GERD (gastroesophageal reflux disease)    History of anal fissures    History of Bell's palsy    2006  RIGHT SIDE--  RESOLVED   History  of gastroesophageal reflux (GERD)    History of kidney stones    Hyperlipidemia    Interstitial cystitis    Psoriasis    Sarcoidosis of lung ([place])    DX 2006--  PULMOLOGIST--  DR QQVZ- on 2 liters of oxygen, goes to pain clinic   Seasonal allergic rhinitis    Shortness of breath    Vaginal Pap smear, abnormal    Past Surgical History:  Procedure Laterality Date   CYSTO WITH HYDRODISTENSION N/A 08/24/2013   Procedure: CYSTOSCOPY/HYDRODISTENSION with instillation of marcaine and pyridium;  Surgeon: Fredricka Bonine, MD;  Location[facility];  Service: Urology;  Laterality: N/A;   CYSTOSCOPY/ HYDRODISTENTION/ BLADDER BX  06-09-2010   CYSTOSCOPY/URETEROSCOPY/HOLMIUM LASER/STENT PLACEMENT Right 01/30/2019  Procedure: PPIRJJOACZ/YSAYTKZSWF/UXNATFTDDUKG/URKYHCW LASER/STENT PLACEMENT;  Surgeon: Festus Aloe, MD;  Location: [facility];  Service: Urology;  Laterality: Right;   DILATION AND CURETTAGE OF UTERUS  1997   EXTRACORPOREAL SHOCK WAVE LITHOTRIPSY Right 11/30/2018   Procedure: EXTRACORPOREAL SHOCK WAVE LITHOTRIPSY (ESWL);  Surgeon: Bjorn Loser, MD;  Location: [facility];  Service: Urology;  Laterality: Right;   IR TRANSCATHETER BX  06/14/2018   IR US GUIDE VASC ACCESS RIGHT  06/14/2018   IR VENOGRAM HEPATIC W HEMODYNAMIC EVALUATION  06/14/2018   Family History  Problem Relation Age of Onset   Brain cancer Maternal Grandmother    Hyperlipidemia Other    Sleep apnea Other    Depression Maternal Aunt    Seizures Maternal Uncle    Colon cancer Neg Hx    Stomach cancer Neg Hx    Social History   Socioeconomic History   Marital status: Single    Spouse name: Not on file   Number of children: 0   Years of education: Not on file   Highest education level: Not on file  Occupational History   Occupation: disabled  Tobacco Use   Smoking status: Former    Packs/day: 0.30    Years: 6.00    Pack years: 1.80    Types: Cigarettes, Cigars    Quit date: 11/2016     Years since quitting: 4.8   Smokeless tobacco: Never   Tobacco comments:    ONE CIG. DAILY /  1 PPMONTH  Vaping Use   Vaping Use: Never used  Substance and Sexual Activity   Alcohol use: No    Alcohol/week: 0.0 standard drinks   Drug use: No    Comment: HX MARIJUANA USE   Sexual activity: Not Currently    Birth control/protection: None    Comment: abstinence   Other Topics Concern   Not on file  Social History Narrative   Not on file   Social Determinants of Health   Financial Resource Strain: Not on file  Food Insecurity: Not on file  Transportation Needs: Not on file  Physical Activity: Not on file  Stress: Not on file  Social Connections: Not on file    Tobacco Counseling Counseling given: Not Answered Tobacco comments: ONE CIG. DAILY /  1 PPMONTH   Clinical Intake:                 Diabetic?No         Activities of Daily Living No flowsheet data found.  Patient Care Team: Vevelyn Francois, NP as PCP - General (Adult Health Nurse Practitioner) Arvella Nigh, MD as Consulting Physician (Chiropractic Medicine)  Indicate any recent Medical Services you may have received from other than [facility] providers in the past year (date may be approximate).     Assessment:   This is a routine wellness examination for Sameerah.  Hearing/Vision screen No results found.  Dietary issues and exercise activities discussed:     Goals Addressed   None   Depression Screen PHQ 2/9 Scores 05/04/2021 05/05/2020 04/23/2020 04/16/2020 02/04/2020 01/07/2020 08/29/2019  PHQ - 2 Score 0 0 2 1 0 0 0  PHQ- 9 Score 0 - 8 - - - -  Exception Documentation - Patient refusal - - - - -    Fall Risk Fall Risk  06/12/2021 05/04/2021 05/05/2020 04/16/2020 02/04/2020  Falls in the past year? 0 0 0 0 0  Number falls in past yr: 0 0 - - -  Injury with Fall? 0 0 - - -  FALL RISK PREVENTION PERTAINING TO THE HOME:  Any stairs in or around the home? Yes  If so, are there any without  handrails? No  Home free of loose throw rugs in walkways, pet beds, electrical cords, etc? Yes  Adequate lighting in your home to reduce risk of falls? Yes   ASSISTIVE DEVICES UTILIZED TO PREVENT FALLS:  Life alert? No  Use of a cane, walker or w/c? Yes  Grab bars in the bathroom? Yes  Shower chair or bench in shower? No  Elevated toilet seat or a handicapped toilet? No   TIMED UP AND GO:  Was the test performed?  N/A .  Length of time to ambulate 10 feet: N/A sec.     Cognitive Function:        Immunizations Immunization History  Administered Date(s) Administered   Dtap, Unspecified 03/25/1977, 06/15/1977, 12/02/1977, 04/05/1979, 05/14/1981   HPV 9-valent 11/15/2019   Influenza Whole 09/02/2011, 09/26/2012   Influenza,inj,Quad PF,6+ Mos 12/27/2012, 09/04/2015, 09/20/2017, 08/10/2018, 08/27/2019, 11/20/2019   Measles 04/29/1978   Mumps 04/29/1978   Pneumococcal Conjugate-13 11/14/2018   Pneumococcal Polysaccharide-23 05/10/2016   Rubella 04/29/1978   Tdap 05/10/2016    TDAP status: Up to date  Flu Vaccine status: Due, Education has been provided regarding the importance of this vaccine. Advised may receive this vaccine at local pharmacy or Health Dept. Aware to provide a copy of the vaccination record if obtained from local pharmacy or Health Dept. Verbalized acceptance and understanding.  Pneumococcal vaccine status: Up to date  Covid-19 vaccine status: Information provided on how to obtain vaccines.   Qualifies for Shingles Vaccine? No   Zostavax completed No   Shingrix Completed?: No.    Education has been provided regarding the importance of this vaccine. Patient has been advised to call insurance company to determine out of pocket expense if they have not yet received this vaccine. Advised may also receive vaccine at local pharmacy or Health Dept. Verbalized acceptance and understanding.  Screening Tests Health Maintenance  Topic Date Due   COVID-19 Vaccine  (1) Never done   INFLUENZA VACCINE  07/27/2021   MAMMOGRAM  11/11/2021   COLONOSCOPY (Pts 45-12yrs Insurance coverage will need to be confirmed)  09/22/2023   PAP SMEAR-Modifier  07/07/2024   TETANUS/TDAP  05/10/2026   Hepatitis C Screening  Completed   HIV Screening  Completed   HPV VACCINES  Aged Out    Health Maintenance  Health Maintenance Due  Topic Date Due   COVID-19 Vaccine (1) Never done   INFLUENZA VACCINE  07/27/2021    Colorectal cancer screening: Type of screening: Colonoscopy. Completed 02/21/2018. Repeat every  10 years  Mammogram status: Completed 07/29/2021. Repeat every year    Lung Cancer Screening: (Low Dose CT Chest recommended if Age 18-80 years, 30 pack-year currently smoking OR have quit w/in 15years.) does not qualify.   Lung Cancer Screening Referral: N/A  Additional Screening:  Hepatitis C Screening: does not qualify; Completed 03/29/2018  Vision Screening: Recommended annual ophthalmology exams for early detection of glaucoma and other disorders of the eye. Is the patient up to date with their annual eye exam?  Yes  Who is the provider or what is the name of the office in which the patient attends annual eye exams? [facility] If pt is not established with a provider, would they like to be referred to a provider to establish care?  Already established .   Dental Screening: Recommended annual dental exams for proper oral  hygiene  Community Resource Referral / Chronic Care Management: CRR required this visit?  Yes   CCM required this visit?  Yes      Plan:     I have personally reviewed and noted the following in the patient's chart:   Medical and social history Use of alcohol, tobacco or illicit drugs  Current medications and supplements including opioid prescriptions.  Functional ability and status Nutritional status Physical activity Advanced directives List of other physicians Hospitalizations, surgeries, and ER visits  in previous 12 months Vitals Screenings to include cognitive, depression, and falls Referrals and appointments  In addition, I have reviewed and discussed with patient certain preventive protocols, quality metrics, and best practice recommendations. A written personalized care plan for preventive services as well as general preventive health recommendations were provided to patient.     Lavell Luster, LPN   05/04/3266   Nurse Notes: Non Face to Face 45 min appt.   Ms. Rutkowski , Thank you for taking time to come for your Medicare Wellness Visit. I appreciate your ongoing commitment to your health goals. Please review the following plan we discussed and let me know if I can assist you in the future.   These are the goals we discussed:  Goals      Gain weight        This is a list of the screening recommended for you and due dates:  Health Maintenance  Topic Date Due   COVID-19 Vaccine (1) Never done   Flu Shot  07/27/2021   Mammogram  11/11/2021   Colon Cancer Screening  09/22/2023   Pap Smear  07/07/2024   Tetanus Vaccine  05/10/2026   Hepatitis C Screening: USPSTF Recommendation to screen - Ages 18-79 yo.  Completed   HIV Screening  Completed   HPV Vaccine  Aged Out

## 2021-09-16 NOTE — Addendum Note (Signed)
Addended by: Ruthell Rummage A on: 09/16/2021 08:26 AM   Modules accepted: Orders

## 2021-09-18 ENCOUNTER — Other Ambulatory Visit: Payer: Self-pay

## 2021-09-18 DIAGNOSIS — Z79891 Long term (current) use of opiate analgesic: Secondary | ICD-10-CM | POA: Diagnosis not present

## 2021-09-18 DIAGNOSIS — M79671 Pain in right foot: Secondary | ICD-10-CM | POA: Diagnosis not present

## 2021-09-18 DIAGNOSIS — M5431 Sciatica, right side: Secondary | ICD-10-CM | POA: Diagnosis not present

## 2021-09-18 DIAGNOSIS — G890 Central pain syndrome: Secondary | ICD-10-CM | POA: Diagnosis not present

## 2021-09-18 DIAGNOSIS — M545 Low back pain, unspecified: Secondary | ICD-10-CM | POA: Diagnosis not present

## 2021-09-18 DIAGNOSIS — M25551 Pain in right hip: Secondary | ICD-10-CM | POA: Diagnosis not present

## 2021-09-18 DIAGNOSIS — M25562 Pain in left knee: Secondary | ICD-10-CM | POA: Diagnosis not present

## 2021-09-18 DIAGNOSIS — R519 Headache, unspecified: Secondary | ICD-10-CM | POA: Diagnosis not present

## 2021-09-18 DIAGNOSIS — G894 Chronic pain syndrome: Secondary | ICD-10-CM | POA: Diagnosis not present

## 2021-09-18 DIAGNOSIS — L409 Psoriasis, unspecified: Secondary | ICD-10-CM | POA: Diagnosis not present

## 2021-09-22 ENCOUNTER — Emergency Department (HOSPITAL_COMMUNITY): Payer: Medicare Other

## 2021-09-22 ENCOUNTER — Emergency Department (HOSPITAL_COMMUNITY)
Admission: EM | Admit: 2021-09-22 | Discharge: 2021-09-23 | Disposition: A | Discharge status: Home / Self Care | Payer: Medicare Other | Attending: Emergency Medicine | Admitting: Emergency Medicine

## 2021-09-22 ENCOUNTER — Ambulatory Visit: Payer: Medicare Other

## 2021-09-22 ENCOUNTER — Other Ambulatory Visit: Payer: Self-pay

## 2021-09-22 DIAGNOSIS — R110 Nausea: Secondary | ICD-10-CM | POA: Diagnosis not present

## 2021-09-22 DIAGNOSIS — E876 Hypokalemia: Secondary | ICD-10-CM | POA: Insufficient documentation

## 2021-09-22 DIAGNOSIS — J4520 Mild intermittent asthma, uncomplicated: Secondary | ICD-10-CM | POA: Insufficient documentation

## 2021-09-22 DIAGNOSIS — F17210 Nicotine dependence, cigarettes, uncomplicated: Secondary | ICD-10-CM | POA: Diagnosis not present

## 2021-09-22 DIAGNOSIS — J189 Pneumonia, unspecified organism: Secondary | ICD-10-CM

## 2021-09-22 DIAGNOSIS — D860 Sarcoidosis of lung: Secondary | ICD-10-CM | POA: Diagnosis not present

## 2021-09-22 DIAGNOSIS — R072 Precordial pain: Secondary | ICD-10-CM | POA: Diagnosis present

## 2021-09-22 DIAGNOSIS — Z7951 Long term (current) use of inhaled steroids: Secondary | ICD-10-CM | POA: Diagnosis not present

## 2021-09-22 DIAGNOSIS — Z79899 Other long term (current) drug therapy: Secondary | ICD-10-CM | POA: Diagnosis not present

## 2021-09-22 DIAGNOSIS — R079 Chest pain, unspecified: Secondary | ICD-10-CM

## 2021-09-22 DIAGNOSIS — R0789 Other chest pain: Secondary | ICD-10-CM | POA: Diagnosis not present

## 2021-09-22 LAB — BASIC METABOLIC PANEL
Anion gap: 10 (ref 5–15)
BUN: 7 mg/dL (ref 6–20)
CO2: 24 mmol/L (ref 22–32)
Calcium: 9.7 mg/dL (ref 8.9–10.3)
Chloride: 105 mmol/L (ref 98–111)
Creatinine, Ser: 0.65 mg/dL (ref 0.44–1.00)
GFR, Estimated: >60 mL/min (ref >60)
Glucose, Bld: 115 mg/dL — ABNORMAL HIGH (ref 70–99)
Potassium: 3.3 mmol/L — ABNORMAL LOW (ref 3.5–5.1)
Sodium: 139 mmol/L (ref 135–145)

## 2021-09-22 LAB — CBC
HCT: 39.7 % (ref 36.0–46.0)
Hemoglobin: 13.1 g/dL (ref 12.0–15.0)
MCH: 28.7 pg (ref 26.0–34.0)
MCHC: 33 g/dL (ref 30.0–36.0)
MCV: 86.9 fL (ref 80.0–100.0)
Platelets: 288 10*3/uL (ref 150–400)
RBC: 4.57 MIL/uL (ref 3.87–5.11)
RDW: 13 % (ref 11.5–15.5)
WBC: 7.3 10*3/uL (ref 4.0–10.5)
nRBC: 0 % (ref 0.0–0.2)

## 2021-09-22 LAB — I-STAT BETA HCG BLOOD, ED (MC, WL, AP ONLY): I-stat hCG, quantitative: <5 m[IU]/mL (ref <5)

## 2021-09-22 LAB — TROPONIN I (HIGH SENSITIVITY): Troponin I (High Sensitivity): 5 ng/L (ref <18)

## 2021-09-22 MED ORDER — ONDANSETRON 4 MG PO TBDP: 8.0000 mg | ORAL_TABLET | Freq: Once | ORAL | Status: DC

## 2021-09-22 NOTE — ED Provider Notes (Signed)
Emergency Medicine Provider Triage Evaluation Note  Rhonda Hester , a 45 y.o. female  was evaluated in triage.  Pt complains of chest pain. Symptoms began 2 days ago.  She says that she has sternal chest pain that is worse when lying flat.  She says that the pain is constant.  Describes it as sharp.  She denies any radiation.  Nothing is made it better.  She does have shortness of breath but that is her baseline as she has a diagnosis of sarcoidosis.  He has associated nausea with no vomiting.  She denies any cardiac history.  Review of Systems  Positive: Chest pain, shortness of breath, nausea Negative: Abdominal pain, diarrhea, fever, chills  Physical Exam  BP (!) 148/102 (BP Location: Left Arm)   Pulse 98   Temp 99 F (37.2 C) (Oral)   Resp (!) 23   Ht 5\' 7"  (1.702 m)   Wt 103.4 kg   SpO2 96%   BMI 35.71 kg/m  Gen:   Awake, no distress Resp:  Normal effort MSK:   Moves extremities without difficulty Other:  Pain is not reproducible with palpation to the anterior chest.  No tenderness to palpation of abdomen.  Medical Decision Making  Medically screening exam initiated at 9:01 PM.  Appropriate orders placed.  Rhonda Hester was informed that the remainder of the evaluation will be completed by another provider, this initial triage assessment does not replace that evaluation, and the importance of remaining in the ED until their evaluation is complete.     Sheila Oats 09/22/21 [id number]    Lennice Sites, DO 09/22/21 [id number]

## 2021-09-22 NOTE — ED Triage Notes (Signed)
Pt c/o centralized chest pain for past 3 days that improves with rest. Symptoms associated with SOB and productive cough. Denies cardiac Hx.

## 2021-09-23 ENCOUNTER — Telehealth: Payer: Self-pay

## 2021-09-23 DIAGNOSIS — R0789 Other chest pain: Secondary | ICD-10-CM | POA: Diagnosis not present

## 2021-09-23 DIAGNOSIS — J189 Pneumonia, unspecified organism: Secondary | ICD-10-CM | POA: Diagnosis not present

## 2021-09-23 LAB — TROPONIN I (HIGH SENSITIVITY): Troponin I (High Sensitivity): 4 ng/L (ref <18)

## 2021-09-23 MED ORDER — AZITHROMYCIN 250 MG PO TABS: 250.0000 mg | ORAL_TABLET | Freq: Every day | ORAL | 0 refills | Status: DC

## 2021-09-23 MED ORDER — DEXAMETHASONE 4 MG PO TABS
10.0000 mg | ORAL_TABLET | Freq: Once | ORAL | Status: AC
  Administered 2021-09-23: 10 mg via ORAL
  Filled 2021-09-23: qty 3

## 2021-09-23 MED ORDER — ALBUTEROL SULFATE HFA 108 (90 BASE) MCG/ACT IN AERS
2.0000 | INHALATION_SPRAY | Freq: Once | RESPIRATORY_TRACT | Status: AC
  Administered 2021-09-23: 2 via RESPIRATORY_TRACT
  Filled 2021-09-23: qty 6.7

## 2021-09-23 NOTE — Telephone Encounter (Signed)
Wynzora denied by [facility] Medicare/PA not covered.  Adapalene Gel also denied. Insurance states these covered alternatives must be tried first.  -Adapalene Cream (genric) -Clindamycin-BP 1-5% -Erythromycin-BP -Tazarotene Cream (will need PA)

## 2021-09-23 NOTE — ED Notes (Signed)
DC instructions reviewed with pt. Pt verbalized understanding.  Pt Dc.

## 2021-09-23 NOTE — Discharge Instructions (Signed)
You can use your inhaler up to every 4 hours while you are awake.  Please follow-up with your family doctor.  Return if you need to use your inhaler more often or if you have worsening difficulty breathing start coughing up blood or feel he should get a pass out or if you have symptoms that worsen upon exertion.

## 2021-09-23 NOTE — ED Provider Notes (Signed)
[facility] EMERGENCY DEPARTMENT Provider Note   CSN: [id number] Arrival date & time: 09/22/21  2036     History Chief Complaint  Patient presents with   Chest Pain    Rhonda Hester is a 45 y.o. female.  45 yo F with a chief complaints of chest pain.  Feels like a pressure is located to the center of her chest.  Worse with certain positions.  Worse with lying on her side or trying to have a bowel movement.  Has had some nausea off and on as well.  No issues eating and drinking.  Denies overt abdominal pain.  Denies exertional symptoms.  Has been coughing a lot more than normal recently.  No fevers or chills.  No known sick contacts.  Patient denies history of MI, denies hypertension hyperlipidemia diabetes.  Endorses smoking history.  Denies family history of MI.  Patient denies history of PE or DVT denies hemoptysis denies unilateral lower extremity edema denies recent surgery immobilization hospitalization estrogen use or history of cancer.    The history is provided by the patient.  Chest Pain Pain location:  Substernal area Pain quality: aching and pressure   Pain radiates to:  Does not radiate Pain severity:  Moderate Onset quality:  Gradual Duration:  2 days Timing:  Intermittent Progression:  Waxing and waning Chronicity:  New Relieved by:  Certain positions Worsened by:  Certain positions Ineffective treatments:  None tried Associated symptoms: nausea and shortness of breath   Associated symptoms: no abdominal pain, no dizziness, no fever, no headache, no palpitations and no vomiting       Past Medical History:  Diagnosis Date   Allergic asthma    Allergy    SEASONAL   Anxiety    Breast mass 05/2017   lt breast lump   Eczema    Fibroid    GERD (gastroesophageal reflux disease)    History of anal fissures    History of Bell's palsy    2006  RIGHT SIDE--  RESOLVED   History of gastroesophageal reflux (GERD)    History of kidney  stones    Hyperlipidemia    Interstitial cystitis    Psoriasis    Sarcoidosis of lung ([place])    DX 2006--  PULMOLOGIST--  DR SPQZ- on 2 liters of oxygen, goes to pain clinic   Seasonal allergic rhinitis    Shortness of breath    Vaginal Pap smear, abnormal     Patient Active Problem List   Diagnosis Date Noted   ASCUS with positive high risk HPV cervical 08/13/2021   Moderate episode of recurrent major depressive disorder (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   Enlarged submental lymph node 03/30/2018   Nodule of soft tissue 03/22/2018   Hemorrhoids 02/13/2018   TMJ (temporomandibular joint disorder) 12/23/2017   Acute pain of right shoulder 11/26/2017   Chronic bilateral low back pain without sciatica 08/01/2017   Chronic  pain of right knee 08/01/2017   Depression 04/02/2017   Pelvic pain 03/28/2017   Obesity (BMI 30.0-34.9) 03/19/2017   Generalized pain 84/16/6063   Metabolic syndrome 01/60/1093   Medication management 03/19/2017   Tobacco abuse 01/24/2017   Uterine fibroid 01/17/2017   Chronic interstitial cystitis 01/17/2017   Menorrhagia 12/06/2016   Acne vulgaris 05/10/2016   BMI 31.0-31.9,adult 05/10/2016   Weight gain 05/10/2016   Right hip pain 10/27/2014   Cervical adenopathy 03/12/2014   History of smoking 08/07/2013   Allergic asthma 07/22/2011    Past Surgical  History:  Procedure Laterality Date   CYSTO WITH HYDRODISTENSION N/A 08/24/2013   Procedure: CYSTOSCOPY/HYDRODISTENSION with instillation of marcaine and pyridium;  Surgeon: Fredricka Bonine, MD;  Location: [facility];  Service: Urology;  Laterality: N/A;   CYSTOSCOPY/ HYDRODISTENTION/ BLADDER BX  06-09-2010   CYSTOSCOPY/URETEROSCOPY/HOLMIUM LASER/STENT PLACEMENT Right 01/30/2019   Procedure: CYSTOSCOPY/RETROGRADE/URETEROSCOPY/HOLMIUM LASER/STENT PLACEMENT;  Surgeon: Festus Aloe, MD;  Location: [facility];  Service: Urology;  Laterality: Right;   DILATION AND CURETTAGE OF UTERUS  1997   EXTRACORPOREAL SHOCK WAVE LITHOTRIPSY Right 11/30/2018   Procedure: EXTRACORPOREAL SHOCK WAVE LITHOTRIPSY (ESWL);  Surgeon: Bjorn Loser, MD;  Location: [facility];  Service: Urology;  Laterality: Right;   IR TRANSCATHETER BX  06/14/2018   IR US GUIDE VASC ACCESS RIGHT  06/14/2018   IR VENOGRAM HEPATIC W HEMODYNAMIC EVALUATION  06/14/2018     OB History     Gravida  1   Para  0   Term  0   Preterm  0   AB  1   Living  0      SAB  1   IAB  0   Ectopic  0   Multiple  0   Live Births              Family History  Problem Relation Age of Onset   Brain cancer Maternal Grandmother    Hyperlipidemia Other    Sleep apnea Other    Depression Maternal Aunt    Seizures Maternal Uncle    Colon cancer Neg Hx    Stomach cancer Neg Hx     Social History   Tobacco Use   Smoking status: Some Days    Packs/day: 0.30    Years: 6.00    Pack years: 1.80    Types: Cigars, Cigarettes    Last attempt to quit: 11/2016    Years since quitting: 4.8   Smokeless tobacco: Never   Tobacco comments:    "Smokes 1 black and mild occasionally"  Vaping Use   Vaping Use: Never used  Substance Use Topics   Alcohol use: No    Alcohol/week: 0.0 standard drinks   Drug use: No    Comment: HX MARIJUANA USE    Home Medications Prior to Admission medications   Medication Sig Start  Date End Date Taking? Authorizing Provider  azithromycin (ZITHROMAX) 250 MG tablet Take 1 tablet (250 mg total) by mouth daily. Take first 2 tablets together, then 1 every day until finished. 09/23/21  Yes Deno Etienne, DO  Adalimumab (HUMIRA PEN) 40 MG/0.4ML PNKT Inject into the skin. 04/22/21 05/22/21  [provider]  Adapalene (DIFFERIN) 0.3 % gel Apply pea sized amount  to face nightly for acne as tolerated 09/15/21   Brendolyn Patty, MD  albuterol [facility] HFA) 108 (90 Base) MCG/ACT inhaler Inhale 2 puffs into the lungs every 6 (six) hours as needed for shortness of breath. 05/10/18  Dorena Dew, FNP  Calcipotriene-Betameth Diprop [facility]) 0.005-0.064 % CREA Apply 1 application topically See admin instructions. Apply topically to affected areas of feet and hands nightly 09/16/21   Brendolyn Patty, MD  Cetirizine HCl 10 MG CAPS Take 1 capsule (10 mg total) by mouth daily. 05/11/19   Wieters, Hallie C, PA-C  clindamycin (CLEOCIN-T) 1 % lotion Apply in morning to face for acne 09/15/21   Brendolyn Patty, MD  clobetasol cream (TEMOVATE) 8.58 % Apply 1 application topically 2 (two) times daily. Patient not taking: Reported on 09/16/2021 03/11/21   Brendolyn Patty, MD  clotrimazole-betamethasone (LOTRISONE) cream Apply 1 application topically 2 (two) times daily. 03/12/21   Vevelyn Francois, NP  cyclobenzaprine (FLEXERIL) 10 MG tablet Take 1 tablet (10 mg total) by mouth 2 (two) times daily as needed for muscle spasms. 07/19/21   Gareth Morgan, MD  doxycycline (MONODOX) 100 MG capsule Take 1 capsule (100 mg total) by mouth 2 (two) times daily. Take with food 09/15/21   Brendolyn Patty, MD  DULERA 200-5 MCG/ACT AERO Inhale 2 puffs into the lungs 2 (two) times daily. Patient not taking: Reported on 09/16/2021 03/29/20   [provider]  Fluocinolone Acetonide Body 0.01 % OIL Apply topically. 01/29/21   [provider]  folic acid (FOLVITE) 1 MG tablet Take by mouth. 12/03/20 12/03/21  [provider]  ibuprofen (ADVIL) 200 MG tablet Take 600-800 mg by mouth every 6 (six) hours as needed for moderate pain.    [provider]  ketoconazole (NIZORAL) 2 % shampoo Apply 1 application topically 2 (two) times a week. Patient not taking: Reported on 09/16/2021 03/12/21   Brendolyn Patty, MD  lidocaine (LIDODERM) 5 % Place 1 patch onto the skin daily. Remove & Discard patch within 12 hours or as directed by MD Patient not taking: Reported on 09/16/2021 07/19/21   Gareth Morgan, MD  medroxyPROGESTERone Acetate 150 MG/ML SUSY Inject 1 mL (150 mg total) into the muscle every 3 (three) months. 02/16/21   Vevelyn Francois, NP  methotrexate (RHEUMATREX) 2.5 MG tablet Take 2.5 mg by mouth every Wednesday. Caution: Chemotherapy. Protect from light.    [provider]  metroNIDAZOLE (METROCREAM) 0.75 % cream Apply topically 2 (two) times daily. 01/29/21   [provider]  naloxone (NARCAN) nasal spray 4 mg/0.1 mL OPIOID EMERGENCY: 1 SPRAY INTO ONE NOSTRIL , MAY REPEAT 2-3 MIN UNTIL RESPONSIVE OR EMS ARRIVES. 01/14/21   [provider]  naproxen (NAPROSYN) 500 MG tablet Take 1 tablet (500 mg total) by mouth 2 (two) times daily. Patient not taking: Reported on 09/16/2021 07/19/21   Gareth Morgan, MD  neomycin-polymyxin-hydrocortisone (CORTISPORIN) OTIC solution Place 3 drops into both ears 4 (four) times daily. 06/22/21   Vevelyn Francois, NP  ondansetron (ZOFRAN ODT) 4 MG disintegrating tablet Take 1 tablet (4 mg total) by mouth every 8 (eight) hours as needed for nausea or vomiting. Patient not taking: Reported on 09/16/2021 07/01/21   Tedd Sias, PA  OTEZLA 30 MG TABS Take 1 tablet by mouth 2 (two) times daily. 04/30/21   [provider]  oxyCODONE (ROXICODONE) 5 MG immediate release tablet Take 1 tablet (5 mg total) by mouth every 6 (six) hours as needed for up to 6 doses for severe pain or breakthrough pain. 07/01/21   Tedd Sias, PA  Sulfacetamide  Sodium-Sulfur 10-5 % SUSP Apply topically 2 (two) times daily. 01/29/21 01/29/22  [provider]  Tapinarof (VTAMA) 1 % CREA Apply  1 application topically See admin instructions. Apply topicially to face, body, hands and feet in morning 09/16/21   Brendolyn Patty, MD  tiZANidine (ZANAFLEX) 4 MG tablet Take 4 mg by mouth 3 (three) times daily. 06/10/20   [provider]  famotidine (PEPCID) 20 MG tablet Take 1 tablet (20 mg total) by mouth 2 (two) times daily. 05/24/19 12/01/20  Lanae Boast, FNP    Allergies    Acyclovir and related, Citric acid, Dust mite extract, Grapeseed extract [nutritional supplements], Pineapple, Pollen extract, and Nickel  Review of Systems   Review of Systems  Constitutional:  Negative for chills and fever.  HENT:  Negative for congestion and rhinorrhea.   Eyes:  Negative for redness and visual disturbance.  Respiratory:  Positive for shortness of breath and wheezing.   Cardiovascular:  Positive for chest pain. Negative for palpitations.  Gastrointestinal:  Positive for nausea. Negative for abdominal pain and vomiting.  Genitourinary:  Negative for dysuria and urgency.  Musculoskeletal:  Negative for arthralgias and myalgias.  Skin:  Negative for pallor and wound.  Neurological:  Negative for dizziness and headaches.   Physical Exam Updated Vital Signs BP (!) 147/101 (BP Location: Right Arm)   Pulse 77   Temp 98.6 F (37 C)   Resp 17   Ht 5\' 7"  (1.702 m)   Wt 103.4 kg   SpO2 98%   BMI 35.71 kg/m   Physical Exam Vitals and nursing note reviewed.  Constitutional:      General: She is not in acute distress.    Appearance: She is well-developed. She is not diaphoretic.  HENT:     Head: Normocephalic and atraumatic.  Eyes:     Pupils: Pupils are equal, round, and reactive to light.  Cardiovascular:     Rate and Rhythm: Normal rate and regular rhythm.     Heart sounds: No murmur heard.   No friction rub. No gallop.  Pulmonary:      Effort: Pulmonary effort is normal.     Breath sounds: No wheezing or rales.     Comments: Mildly diminished breath sounds in all fields Chest:     Chest wall: Tenderness present.     Comments: Pain about the lower sternum reproduces her symptoms.  No significant pain to the epigastrium or the right upper quadrant. Abdominal:     General: There is no distension.     Palpations: Abdomen is soft.     Tenderness: There is no abdominal tenderness.  Musculoskeletal:        General: No tenderness.     Cervical back: Normal range of motion and neck supple.  Skin:    General: Skin is warm and dry.  Neurological:     Mental Status: She is alert and oriented to person, place, and time.  Psychiatric:        Behavior: Behavior normal.    ED Results / Procedures / Treatments   Labs (all labs ordered are listed, but only abnormal results are displayed) Labs Reviewed  BASIC METABOLIC PANEL - Abnormal; Notable for the following components:      Result Value   Potassium 3.3 (*)    Glucose, Bld 115 (*)    All other components within normal limits  CBC  I-STAT BETA HCG BLOOD, ED ([facility], [facility], [facility] ONLY)  TROPONIN I (HIGH SENSITIVITY)  TROPONIN I (HIGH SENSITIVITY)    EKG EKG Interpretation  Date/Time:  Tuesday September 22 2021 20:53:59 EDT Ventricular Rate:  92 PR Interval:  156 QRS Duration: 76 QT Interval:  326 QTC Calculation: 403 R Axis:   56 Text Interpretation: Normal sinus rhythm T wave abnormality, consider inferior ischemia Abnormal ECG flipped t waves inferiorly seen on prior Otherwise no significant change Confirmed by Deno Etienne [phone]) on 09/23/2021 1:49:44 AM  Radiology DG Chest 2 View  Result Date: 09/22/2021 CLINICAL DATA:  Chest pain history of sarcoid EXAM: CHEST - 2 VIEW COMPARISON:  04/11/2020 CT 05/28/2018, radiograph 03/31/2018 FINDINGS: No acute consolidation or effusion. Normal cardiac size. Chronic interstitial lung disease corresponding to history of sarcoidosis.  Architectural distortion in the left hilus as before. Slight increased hilar opacity could be due to adenopathy. No pneumothorax IMPRESSION: Interstitial lung disease with areas of architectural distortion presumably related to history of sarcoid. Probable left hilar adenopathy. Electronically Signed   By: Donavan Foil M.D.   On: 09/22/2021 21:38    Procedures Procedures   Medications Ordered in ED Medications  ondansetron (ZOFRAN-ODT) disintegrating tablet 8 mg (has no administration in time range)  albuterol (VENTOLIN HFA) 108 (90 Base) MCG/ACT inhaler 2 puff (has no administration in time range)  dexamethasone (DECADRON) tablet 10 mg (has no administration in time range)    ED Course  I have reviewed the triage vital signs and the nursing notes.  Pertinent labs & imaging results that were available during my care of the patient were reviewed by me and considered in my medical decision making (see chart for details).    MDM Rules/Calculators/A&P                           45 yo F with a chief complaints of cough shortness of breath and chest pain.  Pain is atypical and reproducible on exam.  Most likely musculoskeletal.  2 troponins are negative.  Very mild hypokalemia.  I feel completely atypical from pulmonary embolism.  The patient also has had increased cough chest x-ray with possible atypical pneumonia as viewed by me.  Difficult to interpret with history of sarcoidosis.  We will treat with a Z-Pak.  Dose of Decadron albuterol.  PCP follow-up.  2:55 AM:  I have discussed the diagnosis/risks/treatment options with the patient and believe the pt to be eligible for discharge home to follow-up with PCP. We also discussed returning to the ED immediately if new or worsening sx occur. We discussed the sx which are most concerning (e.g., sudden worsening pain, fever, inability to tolerate by mouth) that necessitate immediate return. Medications administered to the patient during their visit  and any new prescriptions provided to the patient are listed below.  Medications given during this visit Medications  ondansetron (ZOFRAN-ODT) disintegrating tablet 8 mg (has no administration in time range)  albuterol (VENTOLIN HFA) 108 (90 Base) MCG/ACT inhaler 2 puff (has no administration in time range)  dexamethasone (DECADRON) tablet 10 mg (has no administration in time range)     The patient appears reasonably screen and/or stabilized for discharge and I doubt any other medical condition or other [facility] requiring further screening, evaluation, or treatment in the ED at this time prior to discharge.   Final Clinical Impression(s) / ED Diagnoses Final diagnoses:  Nonspecific chest pain  Atypical pneumonia    Rx / DC Orders ED Discharge Orders          Ordered    azithromycin (ZITHROMAX) 250 MG tablet  Daily        09/23/21 0251  Deno Etienne, DO 09/23/21 [phone]

## 2021-09-24 ENCOUNTER — Other Ambulatory Visit: Payer: Self-pay

## 2021-09-24 ENCOUNTER — Ambulatory Visit (INDEPENDENT_AMBULATORY_CARE_PROVIDER_SITE_OTHER): Payer: Medicare Other

## 2021-09-24 DIAGNOSIS — L409 Psoriasis, unspecified: Secondary | ICD-10-CM

## 2021-09-24 NOTE — Progress Notes (Signed)
Total Surface Area: 376 cm2 Total Energy: 112.80 J

## 2021-09-25 ENCOUNTER — Other Ambulatory Visit: Payer: Self-pay

## 2021-09-25 ENCOUNTER — Telehealth: Payer: Self-pay

## 2021-09-25 MED ORDER — ADAPALENE 0.1 % EX CREA: TOPICAL_CREAM | Freq: Every day | CUTANEOUS | 1 refills | Status: DC

## 2021-09-25 MED ORDER — CALCIPOTRIENE-BETAMETH DIPROP 0.005-0.064 % EX OINT: TOPICAL_OINTMENT | CUTANEOUS | 1 refills | Status: DC

## 2021-09-25 NOTE — Telephone Encounter (Signed)
Called pt discussed we will send in  Taclonex ointment qhs to [facility] for psoriasis.  and Adapalene 0.1% Cream qhs to face for acne (it isn't as strong).  After she tries this, if it doesn't help, we can retry sending and RX for adapalene 0.3% again  Erx'd Taclonex ointment  Erx'd Adapalene cream 0.1%

## 2021-09-29 ENCOUNTER — Ambulatory Visit (INDEPENDENT_AMBULATORY_CARE_PROVIDER_SITE_OTHER): Payer: Medicare Other

## 2021-09-29 ENCOUNTER — Other Ambulatory Visit: Payer: Self-pay

## 2021-09-29 DIAGNOSIS — L409 Psoriasis, unspecified: Secondary | ICD-10-CM | POA: Diagnosis not present

## 2021-09-29 NOTE — Progress Notes (Signed)
Total Surface Area: 380cm2 Total Energy: 131.10J

## 2021-10-07 DIAGNOSIS — Z79899 Other long term (current) drug therapy: Secondary | ICD-10-CM | POA: Diagnosis not present

## 2021-10-07 DIAGNOSIS — L309 Dermatitis, unspecified: Secondary | ICD-10-CM | POA: Diagnosis not present

## 2021-10-07 DIAGNOSIS — D860 Sarcoidosis of lung: Secondary | ICD-10-CM | POA: Diagnosis not present

## 2021-10-13 ENCOUNTER — Ambulatory Visit (INDEPENDENT_AMBULATORY_CARE_PROVIDER_SITE_OTHER): Payer: Medicare Other | Admitting: Nurse Practitioner

## 2021-10-13 ENCOUNTER — Other Ambulatory Visit: Payer: Self-pay

## 2021-10-13 DIAGNOSIS — D860 Sarcoidosis of lung: Secondary | ICD-10-CM | POA: Diagnosis not present

## 2021-10-13 DIAGNOSIS — Z3042 Encounter for surveillance of injectable contraceptive: Secondary | ICD-10-CM

## 2021-10-13 LAB — POCT URINE PREGNANCY: Preg Test, Ur: NEGATIVE

## 2021-10-13 MED ORDER — MEDROXYPROGESTERONE ACETATE 150 MG/ML IM SUSP
150.0000 mg | Freq: Once | INTRAMUSCULAR | Status: AC
  Administered 2021-10-13: 150 mg via INTRAMUSCULAR

## 2021-10-16 DIAGNOSIS — D869 Sarcoidosis, unspecified: Secondary | ICD-10-CM | POA: Diagnosis not present

## 2021-10-16 DIAGNOSIS — R519 Headache, unspecified: Secondary | ICD-10-CM | POA: Diagnosis not present

## 2021-10-16 DIAGNOSIS — G894 Chronic pain syndrome: Secondary | ICD-10-CM | POA: Diagnosis not present

## 2021-10-16 DIAGNOSIS — G890 Central pain syndrome: Secondary | ICD-10-CM | POA: Diagnosis not present

## 2021-10-16 DIAGNOSIS — M545 Low back pain, unspecified: Secondary | ICD-10-CM | POA: Diagnosis not present

## 2021-10-16 DIAGNOSIS — M25562 Pain in left knee: Secondary | ICD-10-CM | POA: Diagnosis not present

## 2021-10-16 DIAGNOSIS — L409 Psoriasis, unspecified: Secondary | ICD-10-CM | POA: Diagnosis not present

## 2021-10-16 DIAGNOSIS — M25571 Pain in right ankle and joints of right foot: Secondary | ICD-10-CM | POA: Diagnosis not present

## 2021-10-16 DIAGNOSIS — Z79891 Long term (current) use of opiate analgesic: Secondary | ICD-10-CM | POA: Diagnosis not present

## 2021-10-16 DIAGNOSIS — M5431 Sciatica, right side: Secondary | ICD-10-CM | POA: Diagnosis not present

## 2021-10-16 DIAGNOSIS — M25551 Pain in right hip: Secondary | ICD-10-CM | POA: Diagnosis not present

## 2021-10-21 ENCOUNTER — Ambulatory Visit: Payer: Medicare Other | Admitting: Dermatology

## 2021-11-05 DIAGNOSIS — K760 Fatty (change of) liver, not elsewhere classified: Secondary | ICD-10-CM | POA: Diagnosis not present

## 2021-11-05 DIAGNOSIS — D8689 Sarcoidosis of other sites: Secondary | ICD-10-CM | POA: Diagnosis not present

## 2021-11-11 ENCOUNTER — Other Ambulatory Visit: Payer: Self-pay

## 2021-11-11 ENCOUNTER — Ambulatory Visit (INDEPENDENT_AMBULATORY_CARE_PROVIDER_SITE_OTHER): Payer: Medicare Other | Admitting: Dermatology

## 2021-11-11 DIAGNOSIS — L700 Acne vulgaris: Secondary | ICD-10-CM | POA: Diagnosis not present

## 2021-11-11 DIAGNOSIS — L409 Psoriasis, unspecified: Secondary | ICD-10-CM

## 2021-11-11 DIAGNOSIS — L810 Postinflammatory hyperpigmentation: Secondary | ICD-10-CM

## 2021-11-11 MED ORDER — DAPSONE 7.5 % EX GEL: 1.0000 "application " | Freq: Every day | CUTANEOUS | 3 refills | Status: DC

## 2021-11-11 MED ORDER — ZORYVE 0.3 % EX CREA
1.0000 | TOPICAL_CREAM | Freq: Two times a day (BID) | CUTANEOUS | 3 refills | Status: DC
Start: 2021-11-11 — End: 2022-01-18

## 2021-11-11 NOTE — Patient Instructions (Signed)

## 2021-11-11 NOTE — Progress Notes (Signed)
Follow-Up Visit   Subjective  Rhonda Hester is a 45 y.o. female who presents for the following: Psoriasis (Pt states that her hands and feet are flared. C/o pain, cracking, and flaking. Pt believes that the talconex was making it worse so she stopped using it. Pt did start retaking MTX 15 mg po qwk. ). Pt also reports that she stopped using the adapalene on her face due to it making her face break out more. Never got doxycycline.    The following portions of the chart were reviewed this encounter and updated as appropriate:     Review of Systems: No other skin or systemic complaints except as noted in HPI or Assessment and Plan.   Objective  Well appearing patient in no apparent distress; mood and affect are within normal limits.  A focused examination was performed including hands, feet, face. Relevant physical exam findings are noted in the Assessment and Plan.  b/l feet, b/l hands Bilateral hyperpigmented thickened plaques with violaceous erythema on feet and fewer pustules, also palms less severely affected with left greater than right with erythema and hyperkeratosis       Head - Anterior (Face) Hyperpigmented patches Closed comedones on perioral chin   Assessment & Plan  Psoriasis b/l feet, b/l hands  Palmarplantar psoriasis with underlying systemic sarcoidosis.  Some improvement, but not at goal.  Off Humira (caused psoriasis to flare), restarted PO MTX for sarcoid, used Taclonex ointment to hands and feet.  Not able to continue with [facility] due to transportation issues  Psoriasis is a chronic non-curable, but treatable genetic/hereditary disease that may have other systemic features affecting other organ systems such as joints (Psoriatic Arthritis). It is associated with an increased risk of inflammatory bowel disease, heart disease, non-alcoholic fatty liver disease, and depression.    Continue MTX. Take 15 mg PO qwk  (2.5 mg tabs x 6)  Discussed increasing dose in  future to achieve better efficacy- may need to go up to 20-25 mg PO qwk. Would be up to the rheumatologist to increase  Restart Taclonex ointment. Apply to feet qhs. Wrap feet with saran wrap after and put on a sock, leave on overnight.  If not tolerable (doesn't like ointment), switch to Clobetasol cream and use in similar fashion.  Start Zoryve cream (Jak Inhibitor). Apply bid to hands and feet. Samples given   Roflumilast (ZORYVE) 0.3 % CREA - b/l feet, b/l hands Apply 1 application topically 2 (two) times daily.  Related Medications clobetasol cream (TEMOVATE) 2.11 % Apply 1 application topically 2 (two) times daily.  ketoconazole (NIZORAL) 2 % shampoo Apply 1 application topically 2 (two) times a week.  Tapinarof (VTAMA) 1 % CREA Apply 1 application topically See admin instructions. Apply topicially to face, body, hands and feet in morning  Acne vulgaris Head - Anterior (Face)  With PIH  Tried and failed adapalene and clindamycin. Never started doxycycline.  Made acne worse per pt, and pt wants to d/c.Marland Kitchen  Explained that acne will often flare before improving  Start dapsone 7.5% gel. Apply to face once a day as tolerated    Dapsone 7.5 % GEL - Head - Anterior (Face) Apply 1 application topically daily.  Related Medications doxycycline (MONODOX) 100 MG capsule Take 1 capsule (100 mg total) by mouth 2 (two) times daily. Take with food  Adapalene (DIFFERIN) 0.3 % gel Apply pea sized amount  to face nightly for acne as tolerated  clindamycin (CLEOCIN-T) 1 % lotion Apply in morning to face  for acne  Return in about 2 months (around 01/11/2022) for psoriasis f/u.  Documentation: I have reviewed the above documentation for accuracy and completeness, and I agree with the above.  Brendolyn Patty MD

## 2021-11-13 DIAGNOSIS — D860 Sarcoidosis of lung: Secondary | ICD-10-CM | POA: Diagnosis not present

## 2021-11-17 ENCOUNTER — Telehealth: Payer: Self-pay

## 2021-11-17 DIAGNOSIS — G894 Chronic pain syndrome: Secondary | ICD-10-CM | POA: Diagnosis not present

## 2021-11-17 DIAGNOSIS — L409 Psoriasis, unspecified: Secondary | ICD-10-CM | POA: Diagnosis not present

## 2021-11-17 DIAGNOSIS — G890 Central pain syndrome: Secondary | ICD-10-CM | POA: Diagnosis not present

## 2021-11-17 DIAGNOSIS — D869 Sarcoidosis, unspecified: Secondary | ICD-10-CM | POA: Diagnosis not present

## 2021-11-17 DIAGNOSIS — M545 Low back pain, unspecified: Secondary | ICD-10-CM | POA: Diagnosis not present

## 2021-11-17 DIAGNOSIS — M79671 Pain in right foot: Secondary | ICD-10-CM | POA: Diagnosis not present

## 2021-11-17 DIAGNOSIS — M25551 Pain in right hip: Secondary | ICD-10-CM | POA: Diagnosis not present

## 2021-11-17 DIAGNOSIS — M5431 Sciatica, right side: Secondary | ICD-10-CM | POA: Diagnosis not present

## 2021-11-17 DIAGNOSIS — R519 Headache, unspecified: Secondary | ICD-10-CM | POA: Diagnosis not present

## 2021-11-17 DIAGNOSIS — M25562 Pain in left knee: Secondary | ICD-10-CM | POA: Diagnosis not present

## 2021-11-17 DIAGNOSIS — Z79891 Long term (current) use of opiate analgesic: Secondary | ICD-10-CM | POA: Diagnosis not present

## 2021-11-17 NOTE — Telephone Encounter (Signed)
Zoryve was denied by  [facility]. I called and spoke with representative this morning and it is a plan exclusion.

## 2021-11-18 DIAGNOSIS — H04123 Dry eye syndrome of bilateral lacrimal glands: Secondary | ICD-10-CM | POA: Diagnosis not present

## 2021-12-13 DIAGNOSIS — D860 Sarcoidosis of lung: Secondary | ICD-10-CM | POA: Diagnosis not present

## 2021-12-16 DIAGNOSIS — M25551 Pain in right hip: Secondary | ICD-10-CM | POA: Diagnosis not present

## 2021-12-16 DIAGNOSIS — M25562 Pain in left knee: Secondary | ICD-10-CM | POA: Diagnosis not present

## 2021-12-16 DIAGNOSIS — G890 Central pain syndrome: Secondary | ICD-10-CM | POA: Diagnosis not present

## 2021-12-16 DIAGNOSIS — Z79891 Long term (current) use of opiate analgesic: Secondary | ICD-10-CM | POA: Diagnosis not present

## 2021-12-16 DIAGNOSIS — R519 Headache, unspecified: Secondary | ICD-10-CM | POA: Diagnosis not present

## 2021-12-16 DIAGNOSIS — M545 Low back pain, unspecified: Secondary | ICD-10-CM | POA: Diagnosis not present

## 2021-12-16 DIAGNOSIS — L409 Psoriasis, unspecified: Secondary | ICD-10-CM | POA: Diagnosis not present

## 2021-12-16 DIAGNOSIS — G894 Chronic pain syndrome: Secondary | ICD-10-CM | POA: Diagnosis not present

## 2021-12-16 DIAGNOSIS — M79671 Pain in right foot: Secondary | ICD-10-CM | POA: Diagnosis not present

## 2021-12-16 DIAGNOSIS — M5431 Sciatica, right side: Secondary | ICD-10-CM | POA: Diagnosis not present

## 2022-01-05 ENCOUNTER — Other Ambulatory Visit: Payer: Self-pay

## 2022-01-05 ENCOUNTER — Ambulatory Visit (INDEPENDENT_AMBULATORY_CARE_PROVIDER_SITE_OTHER): Payer: 59 | Admitting: Nurse Practitioner

## 2022-01-05 DIAGNOSIS — Z3042 Encounter for surveillance of injectable contraceptive: Secondary | ICD-10-CM

## 2022-01-05 MED ORDER — MEDROXYPROGESTERONE ACETATE 150 MG/ML IM SUSP
150.0000 mg | Freq: Once | INTRAMUSCULAR | Status: AC
  Administered 2022-01-05: 150 mg via INTRAMUSCULAR

## 2022-01-06 ENCOUNTER — Telehealth: Payer: Self-pay

## 2022-01-06 NOTE — Progress Notes (Signed)
° °  [place], [place]  [address] Phone:  [phone]   Fax:  [phone]  Pt is here today for her Depo injection

## 2022-01-07 ENCOUNTER — Other Ambulatory Visit: Payer: Self-pay | Admitting: Nurse Practitioner

## 2022-01-07 DIAGNOSIS — R102 Pelvic and perineal pain: Secondary | ICD-10-CM

## 2022-01-07 DIAGNOSIS — L409 Psoriasis, unspecified: Secondary | ICD-10-CM

## 2022-01-07 NOTE — Progress Notes (Signed)
   [facility] [address] [place], [place]  [address] Phone:  [phone]   Fax:  [phone] 

## 2022-01-18 ENCOUNTER — Ambulatory Visit (INDEPENDENT_AMBULATORY_CARE_PROVIDER_SITE_OTHER): Payer: 59 | Admitting: Dermatology

## 2022-01-18 ENCOUNTER — Other Ambulatory Visit: Payer: Self-pay

## 2022-01-18 DIAGNOSIS — L309 Dermatitis, unspecified: Secondary | ICD-10-CM

## 2022-01-18 DIAGNOSIS — L700 Acne vulgaris: Secondary | ICD-10-CM

## 2022-01-18 DIAGNOSIS — L409 Psoriasis, unspecified: Secondary | ICD-10-CM | POA: Diagnosis not present

## 2022-01-18 MED ORDER — CLINDAMYCIN PHOS-BENZOYL PEROX 1-5 % EX GEL: CUTANEOUS | 0 refills | Status: DC

## 2022-01-18 MED ORDER — PIMECROLIMUS 1 % EX CREA: TOPICAL_CREAM | CUTANEOUS | 2 refills | Status: DC

## 2022-01-18 MED ORDER — CLINDAMYCIN PHOS-BENZOYL PEROX 1-5 % EX GEL: CUTANEOUS | 2 refills | Status: DC

## 2022-01-18 MED ORDER — CLOBETASOL PROPIONATE 0.05 % EX CREA: TOPICAL_CREAM | CUTANEOUS | 2 refills | Status: DC

## 2022-01-18 MED ORDER — TRETINOIN 0.025 % EX CREA: TOPICAL_CREAM | CUTANEOUS | 2 refills | Status: DC

## 2022-01-18 NOTE — Telephone Encounter (Signed)
Ok thanks 

## 2022-01-18 NOTE — Progress Notes (Signed)
Follow-Up Visit   Subjective  Rhonda Hester is a 46 y.o. female who presents for the following: Follow-up.  Patient presents for 2 month follow-up psoriasis hands and feet. They are about the same. She takes MTX 15mg  every week and uses Vaseline at night. She has several Rx creams/ointments at home but is not currently using any. Zoryve cream samples helped, but not covered by insurance. She has an itchy rash on her back. Patient was not able to do Xtrac laser treatments due to transportation issues. Pustular rash came up on hands and feet after starting Remicade infusion for Sarcoid, and then worsened with Humira.  Off of both of those meds. R hand has cleared up, but still has rash on feet and L palm  Acne is still flaring, but was not able to get dapsone cream, as it's not covered by insurance. She has also tried and failed clindamycin lotion and adapalene gel (too irritating).   The following portions of the chart were reviewed this encounter and updated as appropriate:       Review of Systems:  No other skin or systemic complaints except as noted in HPI or Assessment and Plan.  Objective  Well appearing patient in no apparent distress; mood and affect are within normal limits.  A focused examination was performed including face, hands, feet. Relevant physical exam findings are noted in the Assessment and Plan.  face Hyperpigmented macules with scattered inflammatory papules on the bilateral cheeks and chin  hands, feet Well-demarcated hyperkeratotic violaceous scaly plaques with some fissures on the bilateral plantar foot and heel, left palm;   Back, face hyperpigmented patches and mild scale of the perioral chin; hyperpigmented scaly patches with follicular prominence on back.    Assessment & Plan  Acne vulgaris face  Start Tretinoin 0.025% cream Apply a pea-sized amount to face qhs as tolerated dsp 45g 2Rf.  Start Clindamycin-benzoyl peroxide gel Apply to face every  morning for acne as tolerated. Dsp 50g 2Rf  Discussed adding oral doxycycline, but pt defers at this time  Topical retinoid medications like tretinoin/Retin-A, adapalene/Differin, tazarotene/Fabior, and Epiduo/Epiduo Forte can cause dryness and irritation when first started. Only apply a pea-sized amount to the entire affected area. Avoid applying it around the eyes, edges of mouth and creases at the nose. If you experience irritation, use a good moisturizer first and/or apply the medicine less often. If you are doing well with the medicine, you can increase how often you use it until you are applying every night. Be careful with sun protection while using this medication as it can make you sensitive to the sun. This medicine should not be used by pregnant women.   Benzoyl peroxide can cause dryness and irritation of the skin. It can also bleach fabric. When used together with Aczone (dapsone) cream, it can stain the skin orange.   tretinoin (RETIN-A) 0.025 % cream - face Apply a pea-sized amount to face every night as tolerated for acne.  Related Medications clindamycin (CLEOCIN-T) 1 % lotion Apply in morning to face for acne  clindamycin-benzoyl peroxide (BENZACLIN) gel Apply to face every morning for acne. Risk bleaching.  Psoriasis hands, feet  Palmar/Plantar Psoriasis vs Hand/foot Dermatitis and underlying systemic sarcoidosis.  Chronic, Severe- Not at goal.   Continue MTX 15 mg PO qwk as prescribed for sarcoid  Restart Clobetasol cream Apply to AA palms/soles prn flares dsp 60g 2Rf. Avoid face, groin, axilla. May wrap with saran wrap/sock overnight  Zoryve Cream - samples given  x 4. Apply to AA hands, feet, face qd.  If not improving on follow-up, will discuss Dupixent injections, since pt has eczematous patches other parts of body.   Topical steroids (such as triamcinolone, fluocinolone, fluocinonide, mometasone, clobetasol, halobetasol, betamethasone, hydrocortisone) can cause  thinning and lightening of the skin if they are used for too long in the same area. Your physician has selected the right strength medicine for your problem and area affected on the body. Please use your medication only as directed by your physician to prevent side effects.    pimecrolimus (ELIDEL) 1 % cream - hands, feet Apply to psoriasis on face/body 1-2 times daily.  Related Medications clobetasol cream (TEMOVATE) 0.05 % Apply to more severe areas psoriasis once to twice daily until improved. Avoid face, groin, underarms.  Eczema, unspecified type Back, face  Recommend mild soap and moisturizing cream 1-2 times daily.  Gentle skin care handout provided.   Start Elidel Cream Apply to AA face/body qd/bid prn dsp 60g 2Rf.  Start clobetasol cream qd/bid to aas back until itchy rash cleared    Return in about 2 months (around 03/18/2022) for psoriasis/eczema.  IJamesetta Orleans, CMA, am acting as scribe for Brendolyn Patty, MD .  Documentation: I have reviewed the above documentation for accuracy and completeness, and I agree with the above.  Brendolyn Patty MD

## 2022-01-18 NOTE — Telephone Encounter (Signed)
No additional notes

## 2022-01-18 NOTE — Patient Instructions (Addendum)
ACNE Tretinoin 0.025% cream - Apply a pea-sized amount to face at night as tolerated for acne. Topical retinoid medications like tretinoin/Retin-A, adapalene/Differin, tazarotene/Fabior, and Epiduo/Epiduo Forte can cause dryness and irritation when first started. Only apply a pea-sized amount to the entire affected area. Avoid applying it around the eyes, edges of mouth and creases at the nose. If you experience irritation, use a good moisturizer first and/or apply the medicine less often. If you are doing well with the medicine, you can increase how often you use it until you are applying every night. Be careful with sun protection while using this medication as it can make you sensitive to the sun. This medicine should not be used by pregnant women.   Clindamycin-benzoyl peroxide - Apply to face every morning for acne. Risk bleaching. Benzoyl peroxide can cause dryness and irritation of the skin. It can also bleach fabric. When used together with Aczone (dapsone) cream, it can stain the skin orange.  PSORIASIS/ECZEMA Zoryve Cream - apply to psoriasis on face, hands, feet twice daily.  **Clobetasol Cream - apply to psoriasis on face, hands, back 1-2 times a day until improved. Avoid face, groin, underarms. Topical steroids (such as triamcinolone, fluocinolone, fluocinonide, mometasone, clobetasol, halobetasol, betamethasone, hydrocortisone) can cause thinning and lightening of the skin if they are used for too long in the same area. Your physician has selected the right strength medicine for your problem and area affected on the body. Please use your medication only as directed by your physician to prevent side effects.   Elidel Cream - Apply to psorasis on face/body once to twice daily. May use on irritated areas.   If You Need Anything After Your Visit  If you have any questions or concerns for your doctor, please call our main line at [phone] and press option 4 to reach your doctor's medical  assistant. If no one answers, please leave a voicemail as directed and we will return your call as soon as possible. Messages left after 4 pm will be answered the following business day.   You may also send [place] a message via [place]. We typically respond to MyChart messages within 1-2 business days.  For prescription refills, please ask your pharmacy to contact our office. Our fax number is [phone].  If you have an urgent issue when the clinic is closed that cannot wait until the next business day, you can page your doctor at the number below.    Please note that while we do our best to be available for urgent issues outside of office hours, we are not available 24/7.   If you have an urgent issue and are unable to reach [place], you may choose to seek medical care at your doctor's office, retail clinic, urgent care center, or emergency room.  If you have a medical emergency, please immediately call 911 or go to the emergency department.  Pager Numbers  - Dr. Nehemiah Massed: [phone]  - Dr. Laurence Ferrari: [phone]  - Dr. Nicole Kindred: [phone]  In the event of inclement weather, please call our main line at [phone] for an update on the status of any delays or closures.  Dermatology Medication Tips: Please keep the boxes that topical medications come in in order to help keep track of the instructions about where and how to use these. Pharmacies typically print the medication instructions only on the boxes and not directly on the medication tubes.   If your medication is too expensive, please contact our office at [phone] option 4 or  send [place] a message through [place].   We are unable to tell what your co-pay for medications will be in advance as this is different depending on your insurance coverage. However, we may be able to find a substitute medication at lower cost or fill out paperwork to get insurance to cover a needed medication.   If a prior authorization is required to get your  medication covered by your insurance company, please allow [place] 1-2 business days to complete this process.  Drug prices often vary depending on where the prescription is filled and some pharmacies may offer cheaper prices.  The website www.goodrx.com contains coupons for medications through different pharmacies. The prices here do not account for what the cost may be with help from insurance (it may be cheaper with your insurance), but the website can give you the price if you did not use any insurance.  - You can print the associated coupon and take it with your prescription to the pharmacy.  - You may also stop by our office during regular business hours and pick up a GoodRx coupon card.  - If you need your prescription sent electronically to a different pharmacy, notify our office through [facility] or by phone at [phone] option 4.     Si Usted Necesita Algo Despus de Su Visita  Tambin puede enviarnos un mensaje a travs de Pharmacist, community. Por lo general respondemos a los mensajes de MyChart en el transcurso de 1 a 2 das hbiles.  Para renovar recetas, por favor pida a su farmacia que se ponga en contacto con nuestra oficina. Harland Dingwall de fax es [place] [phone].  Si tiene un asunto urgente cuando la clnica est cerrada y que no puede esperar hasta el siguiente da hbil, puede llamar/localizar a su doctor(a) al nmero que aparece a continuacin.   Por favor, tenga en cuenta que aunque hacemos todo lo posible para estar disponibles para asuntos urgentes fuera del horario de [place], no estamos disponibles las 24 horas del da, los 7 das de la [place].   Si tiene un problema urgente y no puede comunicarse con nosotros, puede optar por buscar atencin mdica  en el consultorio de su doctor(a), en una clnica privada, en un centro de atencin urgente o en una sala de emergencias.  Si tiene Engineering geologist, por favor llame inmediatamente al 911 o vaya a la sala de  emergencias.  Nmeros de bper  - Dr. Nehemiah Massed: [phone]  - Dra. Moye: [phone]  - Dra. Nicole Kindred: [phone]  En caso de inclemencias del [place], por favor llame a Johnsie Kindred principal al [phone] para una actualizacin sobre el [place] de cualquier retraso o cierre.  Consejos para la medicacin en dermatologa: Por favor, guarde las cajas en las que vienen los medicamentos de uso tpico para ayudarle a seguir las instrucciones sobre dnde y cmo usarlos. Las farmacias generalmente imprimen las instrucciones del medicamento slo en las cajas y no directamente en los tubos del [place].   Si su medicamento es muy caro, por favor, pngase en contacto con Zigmund Daniel llamando al [phone] y presione la opcin 4 o envenos un mensaje a travs de Pharmacist, community.   No podemos decirle cul ser su copago por los medicamentos por adelantado ya que esto es diferente dependiendo de la cobertura de su seguro. Sin embargo, es posible que podamos encontrar un medicamento sustituto a Electrical engineer un formulario para que el seguro cubra el medicamento que se considera necesario.   Si  se requiere una autorizacin previa para que su compaa de seguros [place] su medicamento, por favor permtanos de 1 a 2 das hbiles para completar este proceso.  Los precios de los medicamentos varan con frecuencia dependiendo del Environmental consultant de dnde se surte la receta y alguna farmacias pueden ofrecer precios ms baratos.  El sitio web www.goodrx.com tiene cupones para medicamentos de Airline pilot. Los precios aqu no tienen en cuenta lo que podra costar con la ayuda del seguro (puede ser ms barato con su seguro), pero el sitio web puede darle el precio si no utiliz Research scientist (physical sciences).  - Puede imprimir el cupn correspondiente y llevarlo con su receta a la farmacia.  - Tambin puede pasar por nuestra oficina durante el horario de atencin regular y Charity fundraiser una tarjeta de cupones de GoodRx.  - Si  necesita que su receta se enve electrnicamente a una farmacia diferente, informe a nuestra oficina a travs de MyChart de [place] o por telfono llamando al [phone] y presione la opcin 4.

## 2022-01-25 ENCOUNTER — Other Ambulatory Visit: Payer: Self-pay | Admitting: Nurse Practitioner

## 2022-01-25 DIAGNOSIS — Z1231 Encounter for screening mammogram for malignant neoplasm of breast: Secondary | ICD-10-CM

## 2022-02-08 ENCOUNTER — Ambulatory Visit: Payer: 59

## 2022-02-09 ENCOUNTER — Ambulatory Visit
Admission: RE | Admit: 2022-02-09 | Discharge: 2022-02-09 | Disposition: A | Discharge status: Home / Self Care | Payer: 59 | Source: Ambulatory Visit | Attending: Nurse Practitioner | Admitting: Nurse Practitioner

## 2022-02-09 DIAGNOSIS — Z1231 Encounter for screening mammogram for malignant neoplasm of breast: Secondary | ICD-10-CM

## 2022-02-22 ENCOUNTER — Encounter: Payer: Self-pay | Admitting: Obstetrics and Gynecology

## 2022-02-22 ENCOUNTER — Other Ambulatory Visit: Payer: Self-pay

## 2022-02-22 ENCOUNTER — Ambulatory Visit (INDEPENDENT_AMBULATORY_CARE_PROVIDER_SITE_OTHER): Payer: 59 | Admitting: Obstetrics and Gynecology

## 2022-02-22 VITALS — BP 130/84 | HR 91 | Ht 66.0 in | Wt 274.0 lb

## 2022-02-22 DIAGNOSIS — R102 Pelvic and perineal pain: Secondary | ICD-10-CM

## 2022-02-22 NOTE — Progress Notes (Signed)
46 yo P0 with BMI 44 and amenorrhea secondary to depo-provera presenting today for the evaluation of RLQ pain. Patient reports pain is intermittent and has not been present for a while. She denies abnormal discharge. She is not sexually active. She denies urinary symptoms. Patient reports intermittent constipation. Patient describes the pain as sharp, non radiating and resolves with ibuprofen  Past Medical History:  Diagnosis Date   Allergic asthma    Allergy    SEASONAL   Anxiety    Breast mass 05/2017   lt breast lump   Eczema    Fibroid    GERD (gastroesophageal reflux disease)    History of anal fissures    History of Bell's palsy    2006  RIGHT SIDE--  RESOLVED   History of gastroesophageal reflux (GERD)    History of kidney stones    Hyperlipidemia    Interstitial cystitis    Psoriasis    Sarcoidosis of lung ([place])    DX 2006--  PULMOLOGIST--  DR RFFM- on 2 liters of oxygen, goes to pain clinic   Seasonal allergic rhinitis    Shortness of breath    Vaginal Pap smear, abnormal    Past Surgical History:  Procedure Laterality Date   CYSTO WITH HYDRODISTENSION N/A 08/24/2013   Procedure: CYSTOSCOPY/HYDRODISTENSION with instillation of marcaine and pyridium;  Surgeon: Fredricka Bonine, MD;  Location[facility];  Service: Urology;  Laterality: N/A;   CYSTOSCOPY/ HYDRODISTENTION/ BLADDER BX  06-09-2010   CYSTOSCOPY/URETEROSCOPY/HOLMIUM LASER/STENT PLACEMENT Right 01/30/2019   Procedure: CYSTOSCOPY/RETROGRADE/URETEROSCOPY/HOLMIUM LASER/STENT PLACEMENT;  Surgeon: Festus Aloe, MD;  Location: [facility];  Service: Urology;  Laterality: Right;   DILATION AND CURETTAGE OF UTERUS  1997   EXTRACORPOREAL SHOCK WAVE LITHOTRIPSY Right 11/30/2018   Procedure: EXTRACORPOREAL SHOCK WAVE LITHOTRIPSY (ESWL);  Surgeon: Bjorn Loser, MD;  Location: [facility];  Service: Urology;  Laterality: Right;   IR TRANSCATHETER BX  06/14/2018   IR US GUIDE VASC ACCESS RIGHT  06/14/2018    IR VENOGRAM HEPATIC W HEMODYNAMIC EVALUATION  06/14/2018   Family History  Problem Relation Age of Onset   Brain cancer Maternal Grandmother    Hyperlipidemia Other    Sleep apnea Other    Depression Maternal Aunt    Seizures Maternal Uncle    Colon cancer Neg Hx    Stomach cancer Neg Hx    Social History   Tobacco Use   Smoking status: Some Days    Packs/day: 0.30    Years: 6.00    Pack years: 1.80    Types: Cigars, Cigarettes    Last attempt to quit: 11/2016    Years since quitting: 5.2   Smokeless tobacco: Never   Tobacco comments:    "Smokes 1 black and mild occasionally"  Vaping Use   Vaping Use: Never used  Substance Use Topics   Alcohol use: No    Alcohol/week: 0.0 standard drinks   Drug use: No    Comment: HX MARIJUANA USE   ROS See pertinent in HPI. All other systems reviewed and non contributory Blood pressure 130/84, pulse 91, height 5\' 6"  (1.676 m), weight 274 lb (124.3 kg). GENERAL: Well-developed, well-nourished female in no acute distress.  ABDOMEN: Soft, nontender, nondistended. No organomegaly. No evidence of hernia PELVIC: Patient declined EXTREMITIES: No cyanosis, clubbing, or edema, 2+ distal pulses.  A/P 46 yo with RLQ pain - Reassurance provided - Discussed ways to ease constipation - Pain could be related to fibroid uterus as a larger fibroid is  noted on the right side. Plan to continue depo-provera for now and return if no improvement. Patient agrees with current plan - Patient declined a vaginal swab

## 2022-02-22 NOTE — Progress Notes (Signed)
Reports RLQ abdominal off and on, last episode when made appt. Not present now.  Denies vaginal discharge or irritation.  Denies urinary symptoms.

## 2022-03-16 ENCOUNTER — Ambulatory Visit (INDEPENDENT_AMBULATORY_CARE_PROVIDER_SITE_OTHER): Payer: 59 | Admitting: Dermatology

## 2022-03-16 ENCOUNTER — Other Ambulatory Visit: Payer: Self-pay

## 2022-03-16 DIAGNOSIS — L810 Postinflammatory hyperpigmentation: Secondary | ICD-10-CM | POA: Diagnosis not present

## 2022-03-16 DIAGNOSIS — L309 Dermatitis, unspecified: Secondary | ICD-10-CM | POA: Diagnosis not present

## 2022-03-16 DIAGNOSIS — L409 Psoriasis, unspecified: Secondary | ICD-10-CM | POA: Diagnosis not present

## 2022-03-16 DIAGNOSIS — L700 Acne vulgaris: Secondary | ICD-10-CM

## 2022-03-16 MED ORDER — DOXYCYCLINE MONOHYDRATE 100 MG PO CAPS: 100.0000 mg | ORAL_CAPSULE | Freq: Two times a day (BID) | ORAL | 2 refills | Status: DC

## 2022-03-16 NOTE — Patient Instructions (Addendum)
?For acne -  ?Start doxycycline monohydrate 100 mg 1-2 times daily with food. Can take twice daily when flared, otherwise can take one a day.  ? ?Doxycycline should be taken with food to prevent nausea. Do not lay down for 30 minutes after taking. Be cautious with sun exposure and use good sun protection while on this medication. Pregnant women should not take this medication.  ? ?Continue tretinoin 0.025% cream nightly as tolerated ?Continue clindamycin/BP in the morning  ? ?For psoriasis/eczema - ?Continue CeraVe cream and Vaseline.  ?Recommend CeraVe SA moisturizer daily for hands and feet.  ? ?Continue pimecrolimus 1-2 times daily as needed ?May use clobetasol 1-2 times daily to more severely affected areas with flares. Avoid applying to face, groin, and axilla. Use as directed. Long-term use can cause thinning of the skin. ? ?Topical steroids (such as triamcinolone, fluocinolone, fluocinonide, mometasone, clobetasol, halobetasol, betamethasone, hydrocortisone) can cause thinning and lightening of the skin if they are used for too long in the same area. Your physician has selected the right strength medicine for your problem and area affected on the body. Please use your medication only as directed by your physician to prevent side effects.  ? ? ? ? ?Gentle Skin Care Guide ? ?1. Bathe no more than once a day. ? ?2. Avoid bathing in hot water ? ?3. Use a mild soap like Dove, Vanicream, Cetaphil, CeraVe. Can use Lever 2000 or Cetaphil antibacterial soap ? ?4. Use soap only where you need it. On most days, use it under your arms, between your legs, and on your feet. Let the water rinse other areas unless visibly dirty. ? ?5. When you get out of the bath/shower, use a towel to gently blot your skin dry, don't rub it. ? ?6. While your skin is still a little damp, apply a moisturizing cream such as Vanicream, CeraVe, Cetaphil, Eucerin, Sarna lotion or plain Vaseline Jelly. For hands apply Neutrogena [place] Hand  Cream or Excipial Hand Cream. ? ?7. Reapply moisturizer any time you start to itch or feel dry. ? ?8. Sometimes using free and clear laundry detergents can be helpful. Fabric softener sheets should be avoided. Downy Free & Gentle liquid, or any liquid fabric softener that is free of dyes and perfumes, it acceptable to use ? ?9. If your doctor has given you prescription creams you may apply moisturizers over them  ? ? ?If You Need Anything After Your Visit ? ?If you have any questions or concerns for your doctor, please call our main line at [phone] and press option 4 to reach your doctor's medical assistant. If no one answers, please leave a voicemail as directed and we will return your call as soon as possible. Messages left after 4 pm will be answered the following business day.  ? ?You may also send [place] a message via MyChart. We typically respond to MyChart messages within 1-2 business days. ? ?For prescription refills, please ask your pharmacy to contact our office. Our fax number is [phone]. ? ?If you have an urgent issue when the clinic is closed that cannot wait until the next business day, you can page your doctor at the number below.   ? ?Please note that while we do our best to be available for urgent issues outside of office hours, we are not available 24/7.  ? ?If you have an urgent issue and are unable to reach [place], you may choose to seek medical care at your doctor's office, retail clinic, urgent care  center, or emergency room. ? ?If you have a medical emergency, please immediately call 911 or go to the emergency department. ? ?Pager Numbers ? ?- Dr. Nehemiah Massed: [phone] ? ?- Dr. Laurence Ferrari: [phone] ? ?- Dr. Nicole Kindred: [phone] ? ?In the event of inclement weather, please call our main line at [phone] for an update on the status of any delays or closures. ? ?Dermatology Medication Tips: ?Please keep the boxes that topical medications come in in order to help keep track of the  instructions about where and how to use these. Pharmacies typically print the medication instructions only on the boxes and not directly on the medication tubes.  ? ?If your medication is too expensive, please contact our office at [phone] option 4 or send [place] a message through [place].  ? ?We are unable to tell what your co-pay for medications will be in advance as this is different depending on your insurance coverage. However, we may be able to find a substitute medication at lower cost or fill out paperwork to get insurance to cover a needed medication.  ? ?If a prior authorization is required to get your medication covered by your insurance company, please allow [place] 1-2 business days to complete this process. ? ?Drug prices often vary depending on where the prescription is filled and some pharmacies may offer cheaper prices. ? ?The website www.goodrx.com contains coupons for medications through different pharmacies. The prices here do not account for what the cost may be with help from insurance (it may be cheaper with your insurance), but the website can give you the price if you did not use any insurance.  ?- You can print the associated coupon and take it with your prescription to the pharmacy.  ?- You may also stop by our office during regular business hours and pick up a GoodRx coupon card.  ?- If you need your prescription sent electronically to a different pharmacy, notify our office through [facility] or by phone at [phone] option 4. ? ? ? ? ?Si Usted Necesita Algo Despu?s de Su Visita ? ?Tambi?n puede enviarnos un mensaje a trav?s de MyChart. Por lo general respondemos a los mensajes de MyChart en el transcurso de 1 a 2 d?as h?biles. ? ?Para renovar recetas, por favor pida a su farmacia que se ponga en contacto con nuestra oficina. Nuestro n?mero de fax es el [phone]. ? ?Si tiene un asunto urgente cuando la cl?nica est? cerrada y que no puede esperar hasta el siguiente d?a h?bil,  puede llamar/localizar a su doctor(a) al n?mero que aparece a continuaci?n.  ? ?Por favor, tenga en cuenta que aunque hacemos todo lo posible para estar disponibles para asuntos urgentes fuera del horario de oficina, no estamos disponibles las 24 horas del d?a, los 7 d?as de la semana.  ? ?Si tiene un problema urgente y no puede comunicarse con nosotros, puede optar por buscar atenci?n m?dica  en el consultorio de su doctor(a), en una cl?nica privada, en un centro de atenci?n urgente o en una sala de emergencias. ? ?Si tiene Engineer, maintenance (IT) m?dica, por favor llame inmediatamente al 911 o vaya a la sala de emergencias. ? ?N?meros de b?per ? ?- Dr. Nehemiah Massed: [phone] ? ?- Dra. Moye: [phone] ? ?- Dra. Nicole Kindred: [phone] ? ?En caso de inclemencias del tiempo, por favor llame a nuestra l?nea principal al [phone] para una actualizaci?n sobre el estado de cualquier retraso o cierre. ? ?Consejos para la medicaci?n en dermatolog?a: ?Por favor, guarde las [employer]  que vienen los medicamentos de uso t?pico para ayudarle a seguir las instrucciones sobre d?nde y c?mo usarlos. Las farmacias generalmente imprimen las instrucciones del medicamento s?lo en las cajas y no directamente en los tubos del [place].  ? ?Si su medicamento es muy caro, por favor, p?ngase en contacto con Zigmund Daniel llamando al [phone] y presione la opci?n 4 o env?enos un mensaje a trav?s de MyChart.  ? ?No podemos decirle cu?l ser? su copago por los medicamentos por adelantado ya que esto es diferente dependiendo de la cobertura de su seguro. Sin embargo, es posible que podamos encontrar un medicamento sustituto a Electrical engineer un formulario para que el seguro cubra el medicamento que se considera necesario.  ? ?Si se requiere Ardelia Mems autorizaci?n previa para que su compa??a de seguros [place] su medicamento, por favor perm?tanos de 1 a 2 d?as h?biles para completar este proceso. ? ?Los precios de los medicamentos var?an con  frecuencia dependiendo del Environmental consultant de d?nde se surte la receta y alguna farmacias pueden ofrecer precios m?s baratos. ? ?El sitio web www.goodrx.com tiene cupones para medicamentos de Airline pilot. Los pre

## 2022-03-16 NOTE — Progress Notes (Signed)
? ?Follow-Up Visit ?  ?Subjective  ?Rhonda Hester is a 46 y.o. female who presents for the following: Rash (Patient was prescribed tretinoin 0.025% cream and clindamycin/BP to use in the morning 2 months ago. Patient has been using them but has developed a rash at cheeks and is going down her neck. ) and Psoriasis (Patient was prescribed clobetasol and Elidel at last visit but she feels it was getting worse so she discontinued and is only using Vaseline. ). ? ?Patient restarted Humira about 2 months ago and is on MTX for sarcoid.  ? ?The following portions of the chart were reviewed this encounter and updated as appropriate:  ?  ?  ? ?Review of Systems:  No other skin or systemic complaints except as noted in HPI or Assessment and Plan. ? ?Objective  ?Well appearing patient in no apparent distress; mood and affect are within normal limits. ? ?A focused examination was performed including face, neck, hands, feet. Relevant physical exam findings are noted in the Assessment and Plan. ? ?face ?Hyperpigmented macules and papules at cheeks and jaw ? ?hands, feet ?Hyperpigmented patch with erythema and scale ? ?chest ?Hyperpigmented  scaly patches with follicular prominence at chest, hyperpigmented scaly patch at spinal mid lower back ? ? ? ?Assessment & Plan  ?Acne vulgaris ?face ? ?With PIH, with flare ? ?Start doxycycline monohydrate 100 mg 1-2 times daily with food ? ?Doxycycline should be taken with food to prevent nausea. Do not lay down for 30 minutes after taking. Be cautious with sun exposure and use good sun protection while on this medication. Pregnant women should not take this medication.  ? ?Continue tretinoin 0.025% cream nightly as tolerated ?Continue clindamycin/BP in the morning as tolerated ? ? ?doxycycline (MONODOX) 100 MG capsule - face ?Take 1 capsule (100 mg total) by mouth 2 (two) times daily. ? ?Related Medications ?clindamycin (CLEOCIN-T) 1 % lotion ?Apply in morning to face for  acne ? ?tretinoin (RETIN-A) 0.025 % cream ?Apply a pea-sized amount to face every night as tolerated for acne. ? ?clindamycin-benzoyl peroxide (BENZACLIN) gel ?Apply to face every morning for acne. Risk bleaching. ? ?Psoriasis ?hands, feet ? ?Palmar/Plantar Psoriasis vs Hand/foot Dermatitis and underlying systemic sarcoidosis.  Chronic, improving. ?  ? ?Patient advised a biopsy at palms or soles of feet would not give [place] the information to determine if this is eczema or psoriasis, but they have similar topical treatments.  ? ?Continue MTX 15 mg PO qwk and Humira injections as prescribed for systemic sarcoid. Rash not worsening on Humira. ? ?Continue CeraVe cream and Vaseline.  ?Continue pimecrolimus 1-2 times daily as needed ?May use clobetasol 1-2 times daily to more severely affected areas with flares. Avoid applying to face, groin, and axilla. Use as directed. Long-term use can cause thinning of the skin. ? ?Topical steroids (such as triamcinolone, fluocinolone, fluocinonide, mometasone, clobetasol, halobetasol, betamethasone, hydrocortisone) can cause thinning and lightening of the skin if they are used for too long in the same area. Your physician has selected the right strength medicine for your problem and area affected on the body. Please use your medication only as directed by your physician to prevent side effects.  ? ? ? ? ?Related Medications ?pimecrolimus (ELIDEL) 1 % cream ?Apply to psoriasis on face/body 1-2 times daily. ? ?clobetasol cream (TEMOVATE) 0.05 % ?Apply to more severe areas psoriasis once to twice daily until improved. Avoid face, groin, underarms. ? ?Eczema, unspecified type ?chest ? ?With flare on chest ? ?Continue CeraVe  cream and Vaseline.  ?Continue pimecrolimus 1-2 times daily as needed ?May use clobetasol 1-2 times daily to more severely affected areas with flares. Avoid applying to face, groin, and axilla. Use as directed. Long-term use can cause thinning of the skin. ? ?Recommend  mild soap and moisturizing cream 1-2 times daily.   ? ?Topical steroids (such as triamcinolone, fluocinolone, fluocinonide, mometasone, clobetasol, halobetasol, betamethasone, hydrocortisone) can cause thinning and lightening of the skin if they are used for too long in the same area. Your physician has selected the right strength medicine for your problem and area affected on the body. Please use your medication only as directed by your physician to prevent side effects.  ? ? ? ? ? ?Return in about 3 months (around 06/16/2022) for Eczema, acne. ? ?Graciella Belton, RMA, am acting as scribe for Brendolyn Patty, MD . ? ?Documentation: I have reviewed the above documentation for accuracy and completeness, and I agree with the above. ? ?Brendolyn Patty MD  ? ?

## 2022-03-31 ENCOUNTER — Ambulatory Visit (INDEPENDENT_AMBULATORY_CARE_PROVIDER_SITE_OTHER): Payer: 59 | Admitting: Nurse Practitioner

## 2022-03-31 DIAGNOSIS — Z3042 Encounter for surveillance of injectable contraceptive: Secondary | ICD-10-CM

## 2022-03-31 LAB — POCT URINE PREGNANCY: Preg Test, Ur: NEGATIVE

## 2022-03-31 MED ORDER — MEDROXYPROGESTERONE ACETATE 150 MG/ML IM SUSP
150.0000 mg | Freq: Once | INTRAMUSCULAR | Status: AC
  Administered 2022-03-31: 150 mg via INTRAMUSCULAR

## 2022-04-16 ENCOUNTER — Encounter (HOSPITAL_COMMUNITY): Payer: Self-pay | Admitting: Emergency Medicine

## 2022-04-16 ENCOUNTER — Ambulatory Visit (HOSPITAL_COMMUNITY)
Admission: EM | Admit: 2022-04-16 | Discharge: 2022-04-16 | Disposition: A | Discharge status: Home / Self Care | Payer: 59 | Attending: Family Medicine | Admitting: Family Medicine

## 2022-04-16 ENCOUNTER — Ambulatory Visit (INDEPENDENT_AMBULATORY_CARE_PROVIDER_SITE_OTHER): Payer: 59

## 2022-04-16 DIAGNOSIS — R1032 Left lower quadrant pain: Secondary | ICD-10-CM

## 2022-04-16 DIAGNOSIS — R1030 Lower abdominal pain, unspecified: Secondary | ICD-10-CM

## 2022-04-16 LAB — POCT URINALYSIS DIPSTICK, ED / UC
Bilirubin Urine: NEGATIVE
Glucose, UA: NEGATIVE mg/dL
Hgb urine dipstick: NEGATIVE
Ketones, ur: NEGATIVE mg/dL
Leukocytes,Ua: NEGATIVE
Nitrite: NEGATIVE
Protein, ur: NEGATIVE mg/dL
Specific Gravity, Urine: 1.025 (ref 1.005–1.030)
Urobilinogen, UA: 0.2 mg/dL (ref 0.0–1.0)
pH: 6 (ref 5.0–8.0)

## 2022-04-16 NOTE — ED Triage Notes (Signed)
Pt is present today with LLQ abdominal pain. Pt states the pain started one month ago but has persistently has become worse.  ?

## 2022-04-16 NOTE — Discharge Instructions (Addendum)
-   The x-ray today shows a small stool burden and small amount of gas ?-Please increase your water intake as you already have and start MiraLAX 1 capful daily for the next 1 to 2 weeks ?- Please also decrease intake of juices and fruits; increase vegetables ?-If your pain does not improve despite these interventions, please follow-up with your primary care provider ?-If your pain worsens and/or you develop nausea/vomiting, blood in your bowel movements, or fever, please go to emergency room ?

## 2022-04-16 NOTE — ED Provider Notes (Signed)
?[place] ? ? ? ?CSN: [id number] ?Arrival date & time: 04/16/22  1024 ? ? ?  ? ?History   ?Chief Complaint ?Chief Complaint  ?Patient presents with  ? Abdominal Pain  ? ? ?HPI ?Rhonda Hester is a 46 y.o. female.  ? ?Patient presents with abdominal pain ongoing about 1 month.  Reports the pain is a discomfort and rates it about a 7/10.  The pain is constant.  The patient denies radiation of pain elsewhere. The patient denies fever, nausea/vomiting, weight loss, decreased appetite, diarrhea, constipation, and blood in stool. The patient endorses decrease in bowel movements and waking up with night sweats since the pain started. Has tried drinking more juice and water without relief of symptoms.    ? ?Of note, patient has a medical history of lung sarcoidosis and psoriasis.  She takes inhaler and oxycodone daily per her report.   ? ? ?Past Medical History:  ?Diagnosis Date  ? Allergic asthma   ? Allergy   ? SEASONAL  ? Anxiety   ? Breast mass 05/2017  ? lt breast lump  ? Eczema   ? Fibroid   ? GERD (gastroesophageal reflux disease)   ? History of anal fissures   ? History of Bell's palsy   ? 2006  RIGHT SIDE--  RESOLVED  ? History of gastroesophageal reflux (GERD)   ? History of kidney stones   ? Hyperlipidemia   ? Interstitial cystitis   ? Psoriasis   ? Sarcoidosis of lung ([place])   ? DX 2006--  PULMOLOGIST--  DR Melvyn Novas- on 2 liters of oxygen, goes to pain clinic  ? Seasonal allergic rhinitis   ? Shortness of breath   ? Vaginal Pap smear, abnormal   ? ? ?Patient Active Problem List  ? Diagnosis Date Noted  ? ASCUS with positive high risk HPV cervical 08/13/2021  ? Moderate episode of recurrent major depressive disorder ([place]) 05/05/2020  ? Dysmenorrhea 04/23/2020  ? Dysplasia of cervix, low grade (CIN 1) 03/23/2020  ? HPV (human papilloma virus) infection 03/23/2020  ? Abnormal Pap smear of cervix 11/12/2019  ? Chronic back pain 10/03/2019  ? Eczema 10/03/2019  ? GERD (gastroesophageal reflux disease)  10/03/2019  ? Hyperlipidemia, unspecified 10/03/2019  ? Iron deficiency anemia, unspecified 10/03/2019  ? Mild intermittent asthma without complication 16/55/3748  ? Obesity (BMI 30-39.9) 10/03/2019  ? Tobacco user 10/03/2019  ? Chronic pain syndrome 10/03/2019  ? Sarcoidosis of lung ([place]) 08/22/2018  ? 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 Enlarged submental lymph node 03/30/2018  ? Nodule of soft tissue 03/22/2018  ? Hemorrhoids 02/13/2018  ? TMJ (temporomandibular joint disorder) 12/23/2017  ? Acute pain of right shoulder 11/26/2017  ? Chronic bilateral low back pain without sciatica 08/01/2017  ? Chronic pain of right knee 08/01/2017  ? Depression 04/02/2017  ? Pelvic pain 03/28/2017  ? Obesity (BMI 30.0-34.9) 03/19/2017  ? Generalized pain 03/19/2017  ? Metabolic syndrome 27/06/8674  ? Medication management 03/19/2017  ? Tobacco abuse 01/24/2017  ? Uterine fibroid 01/17/2017  ? Chronic interstitial cystitis 01/17/2017  ? Menorrhagia 12/06/2016  ? Acne vulgaris 05/10/2016  ? BMI 31.0-31.9,adult 05/10/2016  ? Weight gain 05/10/2016  ? Right hip pain 10/27/2014  ? Cervical adenopathy 03/12/2014  ? History of smoking 08/07/2013  ? Allergic asthma 07/22/2011  ? ? ?Past Surgical History:  ?Procedure Laterality Date  ? CYSTO WITH HYDRODISTENSION N/A 08/24/2013  ? Procedure: CYSTOSCOPY/HYDRODISTENSION with instillation of marcaine and pyridium;  Surgeon: Rodman Key  Marella Bile, MD;  Location[facility];  Service: Urology;  Laterality: N/A;  ? CYSTOSCOPY/ HYDRODISTENTION/ BLADDER BX  06-09-2010  ? CYSTOSCOPY/URETEROSCOPY/HOLMIUM LASER/STENT PLACEMENT Right 01/30/2019  ? Procedure: LKGMWNUUVO/ZDGUYQIHKV/QQVZDGLOVFIE/PPIRJJO LASER/STENT PLACEMENT;  Surgeon: Festus Aloe, MD;  Location: [facility];  Service: Urology;  Laterality: Right;  ? [place] OF UTERUS  1997  ? EXTRACORPOREAL SHOCK WAVE LITHOTRIPSY Right 11/30/2018  ? Procedure: EXTRACORPOREAL SHOCK WAVE LITHOTRIPSY  (ESWL);  Surgeon: Bjorn Loser, MD;  Location: [facility];  Service: Urology;  Laterality: Right;  ? IR TRANSCATHETER BX  06/14/2018  ? IR US GUIDE VASC ACCESS RIGHT  06/14/2018  ? IR VENOGRAM HEPATIC W HEMODYNAMIC EVALUATION  06/14/2018  ? ? ?OB History   ? ? Gravida  ?1  ? Para  ?0  ? Term  ?0  ? Preterm  ?0  ? AB  ?1  ? Living  ?0  ?  ? ? SAB  ?1  ? IAB  ?0  ? Ectopic  ?0  ? Multiple  ?0  ? Live Births  ?   ?   ?  ?  ? ? ? ?Home Medications   ? ?Prior to Admission medications   ?Medication Sig Start Date End Date Taking? Authorizing Provider  ?albuterol (PROAIR HFA) 108 (90 Base) MCG/ACT inhaler Inhale 2 puffs into the lungs every 6 (six) hours as needed for shortness of breath. 05/10/18   Dorena Dew, FNP  ?calcipotriene-betamethasone (TACLONEX) ointment Apply to body qhs for psoriasis 09/25/21   Brendolyn Patty, MD  ?clindamycin (CLEOCIN-T) 1 % lotion Apply in morning to face for acne 09/15/21   Brendolyn Patty, MD  ?clindamycin-benzoyl peroxide [facility]) gel Apply to face every morning for acne. Risk bleaching. 01/18/22   Brendolyn Patty, MD  ?clobetasol cream (TEMOVATE) 0.05 % Apply to more severe areas psoriasis once to twice daily until improved. Avoid face, groin, underarms. 01/18/22   Brendolyn Patty, MD  ?clotrimazole-betamethasone (LOTRISONE) cream Apply 1 application topically 2 (two) times daily. ?Patient not taking: Reported on 02/22/2022 03/12/21   Vevelyn Francois, NP  ?cyclobenzaprine (FLEXERIL) 10 MG tablet Take 1 tablet (10 mg total) by mouth 2 (two) times daily as needed for muscle spasms. ?Patient not taking: Reported on 02/22/2022 07/19/21   Gareth Morgan, MD  ?Ruthe Mannan 200-5 MCG/ACT AERO Inhale 2 puffs into the lungs 2 (two) times daily. ?Patient not taking: Reported on 09/16/2021 03/29/20   [provider]  ?ibuprofen (ADVIL) 200 MG tablet Take 600-800 mg by mouth every 6 (six) hours as needed for moderate pain.    [provider]  ?methotrexate (RHEUMATREX) 2.5 MG tablet Take 2.5 mg by  mouth every Wednesday. Caution: Chemotherapy. Protect from light.    [provider]  ?neomycin-polymyxin-hydrocortisone (CORTISPORIN) OTIC solution Place 3 drops into both ears 4 (four) times daily. 06/22/21   Vevelyn Francois, NP  ?ondansetron (ZOFRAN ODT) 4 MG disintegrating tablet Take 1 tablet (4 mg total) by mouth every 8 (eight) hours as needed for nausea or vomiting. ?Patient not taking: Reported on 09/16/2021 07/01/21   Tedd Sias, PA  ?oxyCODONE (ROXICODONE) 5 MG immediate release tablet Take 1 tablet (5 mg total) by mouth every 6 (six) hours as needed for up to 6 doses for severe pain or breakthrough pain. 07/01/21   Tedd Sias, PA  ?pimecrolimus (ELIDEL) 1 % cream Apply to psoriasis on face/body 1-2 times daily. ?Patient not taking: Reported on 02/22/2022 01/18/22   Brendolyn Patty, MD  ?tiZANidine [facility])  4 MG tablet Take 4 mg by mouth 3 (three) times daily. ?Patient not taking: Reported on 02/22/2022 06/10/20   [provider]  ?tretinoin (RETIN-A) 0.025 % cream Apply a pea-sized amount to face every night as tolerated for acne. ?Patient not taking: Reported on 02/22/2022 01/18/22   Brendolyn Patty, MD  ?famotidine (PEPCID) 20 MG tablet Take 1 tablet (20 mg total) by mouth 2 (two) times daily. 05/24/19 12/01/20  Lanae Boast, [place]  ? ? ?Family History ?Family History  ?Problem Relation Age of Onset  ? Brain cancer Maternal Grandmother   ? Hyperlipidemia Other   ? Sleep apnea Other   ? Depression Maternal Aunt   ? Seizures Maternal Uncle   ? Colon cancer Neg Hx   ? Stomach cancer Neg Hx   ? ? ?Social History ?Social History  ? ?Tobacco Use  ? Smoking status: Some Days  ?  Packs/day: 0.30  ?  Years: 6.00  ?  Pack years: 1.80  ?  Types: Cigars, Cigarettes  ?  Last attempt to quit: 11/2016  ?  Years since quitting: 5.3  ? Smokeless tobacco: Never  ? Tobacco comments:  ?  "Smokes 1 black and mild occasionally"  ?Vaping Use  ? Vaping Use: Never used  ?Substance Use Topics  ? Alcohol use: No  ?   Alcohol/week: 0.0 standard drinks  ? Drug use: No  ?  Comment: HX MARIJUANA USE  ? ? ? ?Allergies   ?Acyclovir and related, Bee pollen, Citric acid, Dust mite extract, Grapeseed extract [nutritional

## 2022-04-21 ENCOUNTER — Ambulatory Visit (INDEPENDENT_AMBULATORY_CARE_PROVIDER_SITE_OTHER): Payer: 59 | Admitting: Nurse Practitioner

## 2022-04-21 ENCOUNTER — Encounter: Payer: Self-pay | Admitting: Nurse Practitioner

## 2022-04-21 VITALS — BP 120/84 | HR 85 | Temp 97.9°F | Ht 67.0 in | Wt 233.2 lb

## 2022-04-21 DIAGNOSIS — R1032 Left lower quadrant pain: Secondary | ICD-10-CM

## 2022-04-21 DIAGNOSIS — H9213 Otorrhea, bilateral: Secondary | ICD-10-CM | POA: Diagnosis not present

## 2022-04-21 NOTE — Progress Notes (Signed)
? ?[place] ?[place], [place]  [address] ?Phone:  [phone]   Fax:  [phone] ?Subjective:  ? Patient ID: Rhonda Hester, female    DOB: 1976-10-27, 46 y.o.   MRN: [id number] ? ?Chief Complaint  ?Patient presents with  ? Hospitalization Follow-up  ?  Patient is here today for her hospital follow up visit. Patient was seen in the Ed for lower abdominal pains. Patient states that she has been having ear problems for a couple of months now.  ? ?HPI ?Rhonda Hester 46 y.o. female  has a past medical history of Allergic asthma, Allergy, Anxiety, Breast mass (05/2017), Eczema, Fibroid, GERD (gastroesophageal reflux disease), History of anal fissures, History of Bell's palsy, History of gastroesophageal reflux (GERD), History of kidney stones, Hyperlipidemia, Interstitial cystitis, Psoriasis, Sarcoidosis of lung ([place]), Seasonal allergic rhinitis, Shortness of breath, and Vaginal Pap smear, abnormal. To the [facility] for reevaluation of abdominal pain s/p ED visit. ? ?Patient states that for the past 4-5 mths had foul drainage from bilateral ears. Denies any changes in hearing, pain or tinnitus. Was prescribed medication in the past with no improvement in symptoms. Requesting referral to ENT. ? ?Went to the ED on 4/21 for worsening lower abdominal pain, since being discharged, denies any improvement in  pain. States that she has not began taking previously prescribed Miralax, will begin taking today. Denies any other concerns today. Denies any other GI symptoms.  ? ?Denies any fatigue, chest pain, shortness of breath, HA or dizziness. Denies any blurred vision, numbness or tingling. ? ?Past Medical History:  ?Diagnosis Date  ? Allergic asthma   ? Allergy   ? SEASONAL  ? Anxiety   ? Breast mass 05/2017  ? lt breast lump  ? Eczema   ? Fibroid   ? GERD (gastroesophageal reflux disease)   ? History of anal fissures   ? History of Bell's palsy   ? 2006  RIGHT SIDE--  RESOLVED  ?  History of gastroesophageal reflux (GERD)   ? History of kidney stones   ? Hyperlipidemia   ? Interstitial cystitis   ? Psoriasis   ? Sarcoidosis of lung ([place])   ? DX 2006--  PULMOLOGIST--  DR Melvyn Novas- on 2 liters of oxygen, goes to pain clinic  ? Seasonal allergic rhinitis   ? Shortness of breath   ? Vaginal Pap smear, abnormal   ? ? ?Past Surgical History:  ?Procedure Laterality Date  ? CYSTO WITH HYDRODISTENSION N/A 08/24/2013  ? Procedure: CYSTOSCOPY/HYDRODISTENSION with instillation of marcaine and pyridium;  Surgeon: Fredricka Bonine, MD;  Location: [facility];  Service: Urology;  Laterality: N/A;  ? CYSTOSCOPY/ HYDRODISTENTION/ BLADDER BX  06-09-2010  ? CYSTOSCOPY/URETEROSCOPY/HOLMIUM LASER/STENT PLACEMENT Right 01/30/2019  ? Procedure: VOHYWVPXTG/GYIRSWNIOE/VOJJKKXFGHWE/XHBZJIR LASER/STENT PLACEMENT;  Surgeon: Festus Aloe, MD;  Location: [facility];  Service: Urology;  Laterality: Right;  ? [place] OF UTERUS  1997  ? EXTRACORPOREAL SHOCK WAVE LITHOTRIPSY Right 11/30/2018  ? Procedure: EXTRACORPOREAL SHOCK WAVE LITHOTRIPSY (ESWL);  Surgeon: Bjorn Loser, MD;  Location: [facility];  Service: Urology;  Laterality: Right;  ? IR TRANSCATHETER BX  06/14/2018  ? IR US GUIDE VASC ACCESS RIGHT  06/14/2018  ? IR VENOGRAM HEPATIC W HEMODYNAMIC EVALUATION  06/14/2018  ? ? ?Family History  ?Problem Relation Age of Onset  ? Brain cancer Maternal Grandmother   ? Hyperlipidemia Other   ? Sleep apnea Other   ? Depression Maternal Aunt   ? Seizures  Maternal Uncle   ? Colon cancer Neg Hx   ? Stomach cancer Neg Hx   ? ? ?Social History  ? ?Socioeconomic History  ? Marital status: Single  ?  Spouse name: Not on file  ? Number of children: 0  ? Years of education: Not on file  ? Highest education level: Some college, no degree  ?Occupational History  ? Occupation: disabled  ?Tobacco Use  ? Smoking status: Some Days  ?  Packs/day: 0.30  ?  Years: 6.00  ?  Pack years: 1.80  ?  Types: Cigars,  Cigarettes  ?  Last attempt to quit: 11/2016  ?  Years since quitting: 5.4  ? Smokeless tobacco: Never  ? Tobacco comments:  ?  "Smokes 1 black and mild occasionally"  ?Vaping Use  ? Vaping Use: Never used  ?Substance and Sexual Activity  ? Alcohol use: No  ?  Alcohol/week: 0.0 standard drinks  ? Drug use: No  ?  Comment: HX MARIJUANA USE  ? Sexual activity: Not Currently  ?  Birth control/protection: None  ?  Comment: abstinence   ?Other Topics Concern  ? Not on file  ?Social History Narrative  ? Lives alone.  ? ?Social Determinants of Health  ? ?Financial Resource Strain: Low Risk   ? Difficulty of Paying Living Expenses: Not very hard  ?Food Insecurity: No Food Insecurity  ? Worried About Charity fundraiser in the Last Year: Never true  ? Ran Out of Food in the Last Year: Never true  ?Transportation Needs: No Transportation Needs  ? Lack of Transportation (Medical): No  ? Lack of Transportation (Non-Medical): No  ?Physical Activity: Inactive  ? Days of Exercise per Week: 0 days  ? Minutes of Exercise per Session: 0 min  ?Stress: Stress Concern Present  ? Feeling of Stress : To some extent  ?Social Connections: Socially Isolated  ? Frequency of Communication with Friends and Family: Once a week  ? Frequency of Social Gatherings with Friends and Family: Once a week  ? Attends Religious Services: Never  ? Active Member of Clubs or Organizations: No  ? Attends Archivist Meetings: Never  ? Marital Status: Never married  ?Intimate Partner Violence: Not At Risk  ? Fear of Current or Ex-Partner: No  ? Emotionally Abused: No  ? Physically Abused: No  ? Sexually Abused: No  ? ? ?Outpatient Medications Prior to Visit  ?Medication Sig Dispense Refill  ? Adalimumab 40 MG/0.8ML PNKT Inject into the skin.    ? albuterol (PROAIR HFA) 108 (90 Base) MCG/ACT inhaler Inhale 2 puffs into the lungs every 6 (six) hours as needed for shortness of breath. 1 Inhaler 5  ? calcipotriene-betamethasone (TACLONEX) ointment Apply to  body qhs for psoriasis 60 g 1  ? clindamycin (CLEOCIN-T) 1 % lotion Apply in morning to face for acne 60 mL 3  ? clindamycin-benzoyl peroxide (BENZACLIN) gel Apply to face every morning for acne. Risk bleaching. 50 g 2  ? clobetasol cream (TEMOVATE) 0.05 % Apply to more severe areas psoriasis once to twice daily until improved. Avoid face, groin, underarms. 60 g 2  ? DULoxetine (CYMBALTA) 30 MG capsule Take 30 mg by mouth 2 (two) times daily.    ? folic acid (FOLVITE) 1 MG tablet Take 1 mg by mouth daily.    ? ibuprofen (ADVIL) 200 MG tablet Take 600-800 mg by mouth every 6 (six) hours as needed for moderate pain.    ? LOFENA 25 MG  TABS Take by mouth.    ? methotrexate (RHEUMATREX) 2.5 MG tablet Take 2.5 mg by mouth every Wednesday. Caution: Chemotherapy. Protect from light.    ? neomycin-polymyxin-hydrocortisone (CORTISPORIN) OTIC solution Place 3 drops into both ears 4 (four) times daily. 10 mL 0  ? oxyCODONE (ROXICODONE) 5 MG immediate release tablet Take 1 tablet (5 mg total) by mouth every 6 (six) hours as needed for up to 6 doses for severe pain or breakthrough pain. 6 tablet 0  ? clotrimazole-betamethasone (LOTRISONE) cream Apply 1 application topically 2 (two) times daily. (Patient not taking: Reported on 02/22/2022) 30 g 0  ? cyclobenzaprine (FLEXERIL) 10 MG tablet Take 1 tablet (10 mg total) by mouth 2 (two) times daily as needed for muscle spasms. (Patient not taking: Reported on 02/22/2022) 20 tablet 0  ? DULERA 200-5 MCG/ACT AERO Inhale 2 puffs into the lungs 2 (two) times daily. (Patient not taking: Reported on 09/16/2021)    ? ondansetron (ZOFRAN ODT) 4 MG disintegrating tablet Take 1 tablet (4 mg total) by mouth every 8 (eight) hours as needed for nausea or vomiting. (Patient not taking: Reported on 09/16/2021) 20 tablet 0  ? pimecrolimus (ELIDEL) 1 % cream Apply to psoriasis on face/body 1-2 times daily. (Patient not taking: Reported on 02/22/2022) 60 g 2  ? tiZANidine (ZANAFLEX) 4 MG tablet Take 4 mg by  mouth 3 (three) times daily. (Patient not taking: Reported on 02/22/2022)    ? tretinoin (RETIN-A) 0.025 % cream Apply a pea-sized amount to face every night as tolerated for acne. (Patient not taking: Repor

## 2022-04-21 NOTE — Patient Instructions (Signed)
You were seen today in the [facility] for follow up s/p ED visit.  Please follow up in 3 mths for reevaluation and wellness visit. ?

## 2022-06-22 ENCOUNTER — Ambulatory Visit (INDEPENDENT_AMBULATORY_CARE_PROVIDER_SITE_OTHER): Payer: 59 | Admitting: Dermatology

## 2022-06-22 DIAGNOSIS — L409 Psoriasis, unspecified: Secondary | ICD-10-CM | POA: Diagnosis not present

## 2022-06-22 NOTE — Progress Notes (Signed)
   Follow-Up Visit   Subjective  Rhonda Hester is a 46 y.o. female who presents for the following: Follow-up (Palmar/Plantar Psoriasis vs Hand/foot Dermatitis and underlying systemic sarcoidosis - hands are better but feet are not. She is no longer taking MTX; only take Humira injections for sarcoid).  Feet have been worse in past, not as painful. She uses clobetasol cream to feet.    The following portions of the chart were reviewed this encounter and updated as appropriate:       Review of Systems:  No other skin or systemic complaints except as noted in HPI or Assessment and Plan.  Objective  Well appearing patient in no apparent distress; mood and affect are within normal limits.  A focused examination was performed including hands, feet. Relevant physical exam findings are noted in the Assessment and Plan.  Hands, feet Hyperpigmented violaceous plaque with mild scale of bilateral medial plantar feet. Mild xerosis of bilateral hands    Assessment & Plan  Psoriasis Hands, feet  Palmar/Plantar Psoriasis vs Hand/foot Dermatitis and underlying systemic sarcoidosis, on Humira for sarcoid.  Chronic and persistent condition with duration or expected duration over one year. Condition is symptomatic/ bothersome to patient. Not currently at goal.  Hands better, feet somewhat improved but not at goal.  Psoriasis is a chronic non-curable, but treatable genetic/hereditary disease that may have other systemic features affecting other organ systems such as joints (Psoriatic Arthritis). It is associated with an increased risk of inflammatory bowel disease, heart disease, non-alcoholic fatty liver disease, and depression.    Continue Clobetasol cream once or twice daily to affected areas when flared - may continue to wrap over night. Advised patient not for face, groin or underarms. Continue Pimecrolimus (Elidel) cream once or twice daily to affected areas.  Related  Medications pimecrolimus (ELIDEL) 1 % cream Apply to psoriasis on face/body 1-2 times daily.  clobetasol cream (TEMOVATE) 0.05 % Apply to more severe areas psoriasis once to twice daily until improved. Avoid face, groin, underarms.   Return in about 6 months (around 12/22/2022).  I, Joanie Coddington, CMA, am acting as scribe for Willeen Niece, MD .   Documentation: I have reviewed the above documentation for accuracy and completeness, and I agree with the above.  Willeen Niece MD

## 2022-06-30 ENCOUNTER — Ambulatory Visit (INDEPENDENT_AMBULATORY_CARE_PROVIDER_SITE_OTHER): Payer: 59 | Admitting: Nurse Practitioner

## 2022-06-30 DIAGNOSIS — Z3042 Encounter for surveillance of injectable contraceptive: Secondary | ICD-10-CM

## 2022-06-30 LAB — POCT URINE PREGNANCY: Preg Test, Ur: NEGATIVE

## 2022-06-30 MED ORDER — MEDROXYPROGESTERONE ACETATE 150 MG/ML IM SUSP
150.0000 mg | Freq: Once | INTRAMUSCULAR | Status: AC
  Administered 2022-06-30: 150 mg via INTRAMUSCULAR

## 2022-07-22 ENCOUNTER — Ambulatory Visit (INDEPENDENT_AMBULATORY_CARE_PROVIDER_SITE_OTHER): Payer: 59 | Admitting: Nurse Practitioner

## 2022-07-22 ENCOUNTER — Encounter: Payer: Self-pay | Admitting: Nurse Practitioner

## 2022-07-22 VITALS — BP 137/78 | HR 71 | Temp 97.6°F | Ht 67.0 in | Wt 228.6 lb

## 2022-07-22 DIAGNOSIS — R21 Rash and other nonspecific skin eruption: Secondary | ICD-10-CM | POA: Diagnosis not present

## 2022-07-22 MED ORDER — CLOTRIMAZOLE-BETAMETHASONE 1-0.05 % EX CREA
1.0000 | TOPICAL_CREAM | Freq: Every day | CUTANEOUS | 0 refills | Status: DC
Start: 2022-07-22 — End: 2023-02-22

## 2022-07-22 NOTE — Assessment & Plan Note (Signed)
-   clotrimazole-betamethasone (LOTRISONE) cream; Apply 1 Application topically daily.  Dispense: 30 g; Refill: 0  - keep area clean and dry   2. Insomnia  May try melatonin   Follow up:  Follow up in 6 months complete physical

## 2022-07-22 NOTE — Patient Instructions (Addendum)
1. Rash  - clotrimazole-betamethasone (LOTRISONE) cream; Apply 1 Application topically daily.  Dispense: 30 g; Refill: 0  - keep area clean and dry   2. Insomnia  May try melatonin   Follow up:  Follow up in 6 months complete physical

## 2022-07-22 NOTE — Progress Notes (Signed)
$'@Patient'H$  ID: Rhonda Hester, female    DOB: [birth year], 46 y.o.   MRN: [id number]  Chief Complaint  Patient presents with   Follow-up    Pt is here for 3 month's follow up visit. Pt states she has a bumps itching LT under arm also been having a lot of wheezing.    Referring provider: No ref. provider found   HPI  Rhonda Hester 46 y.o. female  has a past medical history of Allergic asthma, Allergy, Anxiety, Breast mass (05/2017), Eczema, Fibroid, GERD (gastroesophageal reflux disease), History of anal fissures, History of Bell's palsy, History of gastroesophageal reflux (GERD), History of kidney stones, Hyperlipidemia, Interstitial cystitis, Psoriasis, Sarcoidosis of lung ([place]), Seasonal allergic rhinitis, Shortness of breath, and Vaginal Pap smear, abnormal.  Patient presents today for a 68-monthfollow-up.  [facility].  She states that she does still smoke.  She has been noticing wheezing and coughing at night.  She was advised to let her pulmonologist know this.  Patient also complains today of bumps and itching to her left axillary region.  She states that this has been going on for couple weeks now.  We will order Lotrisone cream. Denies f/c/s, n/v/d, hemoptysis, PND, leg swelling Denies chest pain or edema       Allergies  Allergen Reactions   Acyclovir And Related Other (See Comments)    Patient states it "made me feel like I can't breath"    Bee Pollen Itching and Other (See Comments)     coughing   Citric Acid Other (See Comments)    AVOIDS DUE TO INTERSTITIAL CYSTITIS   Dust Mite Extract Itching and Other (See Comments)     coughing   Grapeseed Extract [Nutritional Supplements] Other (See Comments)    Throat itching and burning   Pineapple Itching    Burning in mouth and tingling lips   Pollen Extract Itching and Other (See Comments)     coughing   Nickel Rash    Immunization History   Administered Date(s) Administered   Dtap, Unspecified 03/25/1977, 06/15/1977, 12/02/1977, 04/05/1979, 05/14/1981   HPV 9-valent 11/15/2019   Influenza Whole 09/02/2011, 09/26/2012   Influenza,inj,Quad PF,6+ Mos 12/27/2012, 09/04/2015, 09/20/2017, 08/10/2018, 08/27/2019, 11/20/2019   Measles 04/29/1978   Mumps 04/29/1978   Pneumococcal Conjugate-13 11/14/2018   Pneumococcal Polysaccharide-23 05/10/2016   Rubella 04/29/1978   Tdap 05/10/2016    Past Medical History:  Diagnosis Date   Allergic asthma    Allergy    SEASONAL   Anxiety    Breast mass 05/2017   lt breast lump   Eczema    Fibroid    GERD (gastroesophageal reflux disease)    History of anal fissures    History of Bell's palsy    2006  RIGHT SIDE--  RESOLVED   History of gastroesophageal reflux (GERD)    History of kidney stones    Hyperlipidemia    Interstitial cystitis    Psoriasis    [place]osis of lung ([place]    DX 2006--  PULMOLOGIST--  DR [id number] on 2 liters of oxygen, goes to pain clinic   Seasonal allergic rhinitis    Shortness of breath    Vaginal Pap smear, abnormal     Tobacco History: Social History   Tobacco Use  Smoking Status Some Days   Packs/day: 0.30   Years: 6.00   Total pack years: 1.80   Types: Cigars, Cigarettes   Last attempt to  quit: 11/2016   Years since quitting: 5.6  Smokeless Tobacco Never  Tobacco Comments   "Smokes 1 black and mild occasionally"   Ready to quit: Not Answered Counseling given: Not Answered Tobacco comments: "Smokes 1 black and mild occasionally"   Outpatient Encounter Medications as of 07/22/2022  Medication Sig   Adalimumab 40 MG/0.8ML PNKT Inject into the skin.   albuterol (PROAIR HFA) 108 (90 Base) MCG/ACT inhaler Inhale 2 puffs into the lungs every 6 (six) hours as needed for shortness of breath.   clotrimazole-betamethasone (LOTRISONE) cream Apply 1 Application topically daily.   DULERA 200-5 MCG/ACT AERO Inhale 2 puffs into the lungs 2 (two) times  daily.   ibuprofen (ADVIL) 200 MG tablet Take 600-800 mg by mouth every 6 (six) hours as needed for moderate pain.   oxyCODONE (ROXICODONE) 5 MG immediate release tablet Take 1 tablet (5 mg total) by mouth every 6 (six) hours as needed for up to 6 doses for severe pain or breakthrough pain.   calcipotriene-betamethasone (TACLONEX) ointment Apply to body qhs for psoriasis (Patient not taking: Reported on 07/22/2022)   clindamycin (CLEOCIN-T) 1 % lotion Apply in morning to face for acne (Patient not taking: Reported on 07/22/2022)   clindamycin-benzoyl peroxide (BENZACLIN) gel Apply to face every morning for acne. Risk bleaching. (Patient not taking: Reported on 07/22/2022)   clobetasol cream (TEMOVATE) 0.05 % Apply to more severe areas psoriasis once to twice daily until improved. Avoid face, groin, underarms. (Patient not taking: Reported on 07/22/2022)   clotrimazole-betamethasone (LOTRISONE) cream Apply 1 application topically 2 (two) times daily. (Patient not taking: Reported on 02/22/2022)   cyclobenzaprine (FLEXERIL) 10 MG tablet Take 1 tablet (10 mg total) by mouth 2 (two) times daily as needed for muscle spasms. (Patient not taking: Reported on 02/22/2022)   DULoxetine (CYMBALTA) 30 MG capsule Take 30 mg by mouth 2 (two) times daily. (Patient not taking: Reported on 1/60/1093)   folic acid (FOLVITE) 1 MG tablet Take 1 mg by mouth daily. (Patient not taking: Reported on 07/22/2022)   LOFENA 25 MG TABS Take by mouth. (Patient not taking: Reported on 07/22/2022)   methotrexate (RHEUMATREX) 2.5 MG tablet Take 2.5 mg by mouth every Wednesday. Caution: Chemotherapy. Protect from light. (Patient not taking: Reported on 07/22/2022)   neomycin-polymyxin-hydrocortisone (CORTISPORIN) OTIC solution Place 3 drops into both ears 4 (four) times daily. (Patient not taking: Reported on 07/22/2022)   ondansetron (ZOFRAN ODT) 4 MG disintegrating tablet Take 1 tablet (4 mg total) by mouth every 8 (eight) hours as needed for  nausea or vomiting. (Patient not taking: Reported on 09/16/2021)   pimecrolimus (ELIDEL) 1 % cream Apply to psoriasis on face/body 1-2 times daily. (Patient not taking: Reported on 02/22/2022)   tiZANidine (ZANAFLEX) 4 MG tablet Take 4 mg by mouth 3 (three) times daily. (Patient not taking: Reported on 07/22/2022)   tretinoin (RETIN-A) 0.025 % cream Apply a pea-sized amount to face every night as tolerated for acne. (Patient not taking: Reported on 02/22/2022)   [DISCONTINUED] famotidine (PEPCID) 20 MG tablet Take 1 tablet (20 mg total) by mouth 2 (two) times daily.   Facility-Administered Encounter Medications as of 07/22/2022  Medication   bupivacaine (MARCAINE) 0.5 % 15 mL, phenazopyridine (PYRIDIUM) 400 mg bladder mixture     Review of Systems  Review of Systems  Constitutional: Negative.   HENT: Negative.    Cardiovascular: Negative.   Gastrointestinal: Negative.   Skin:  Positive for rash.  Allergic/Immunologic: Negative.   Neurological: Negative.  Psychiatric/Behavioral: Negative.         Physical Exam  BP 137/78 (BP Location: Right Arm, Patient Position: Sitting, Cuff Size: Normal)   Pulse 71   Temp 97.6 F (36.4 C)   Ht '5\' 7"'$  (1.702 m)   Wt 228 lb 9.6 oz (103.7 kg)   SpO2 100%   BMI 35.80 kg/m   Wt Readings from Last 5 Encounters:  07/22/22 228 lb 9.6 oz (103.7 kg)  04/21/22 233 lb 3.2 oz (105.8 kg)  02/22/22 274 lb (124.3 kg)  09/22/21 228 lb (103.4 kg)  08/13/21 233 lb (105.7 kg)     Physical Exam Vitals and nursing note reviewed.  Constitutional:      General: She is not in acute distress.    Appearance: She is well-developed.  Cardiovascular:     Rate and Rhythm: Normal rate and regular rhythm.  Pulmonary:     Effort: Pulmonary effort is normal.     Breath sounds: Normal breath sounds.  Skin:    Comments: Rash noted to left axilla  Neurological:     Mental Status: She is alert and oriented to person, place, and time.      Lab  Results:  CBC    Component Value Date/Time   WBC 7.3 09/22/2021 2051   RBC 4.57 09/22/2021 2051   HGB 13.1 09/22/2021 2051   HGB 10.2 (L) 09/01/2018 1116   HCT 39.7 09/22/2021 2051   HCT 32.8 (L) 09/01/2018 1116   PLT 288 09/22/2021 2051   PLT 389 09/01/2018 1116   MCV 86.9 09/22/2021 2051   MCV 84 09/01/2018 1116   MCH 28.7 09/22/2021 2051   MCHC 33.0 09/22/2021 2051   RDW 13.0 09/22/2021 2051   RDW 21.5 (H) 09/01/2018 1116   LYMPHSABS 2.2 07/19/2021 1519   LYMPHSABS 1.1 09/01/2018 1116   MONOABS 0.4 07/19/2021 1519   EOSABS 0.3 07/19/2021 1519   EOSABS 0.6 (H) 09/01/2018 1116   BASOSABS 0.0 07/19/2021 1519   BASOSABS 0.0 09/01/2018 1116    BMET    Component Value Date/Time   NA 139 09/22/2021 2051   NA 141 05/05/2020 1059   K 3.3 (L) 09/22/2021 2051   CL 105 09/22/2021 2051   CO2 24 09/22/2021 2051   GLUCOSE 115 (H) 09/22/2021 2051   BUN 7 09/22/2021 2051   BUN 10 05/05/2020 1059   CREATININE 0.65 09/22/2021 2051   CREATININE 0.65 08/09/2017 1018   CALCIUM 9.7 09/22/2021 2051   GFRNONAA >60 09/22/2021 2051   GFRNONAA >89 08/09/2017 1018   GFRAA 121 05/05/2020 1059   GFRAA >89 08/09/2017 1018    BNP No results found for: "BNP"  ProBNP    Component Value Date/Time   PROBNP 10.0 03/31/2018 1229    Imaging: No results found.   Assessment & Plan:   Rash - clotrimazole-betamethasone (LOTRISONE) cream; Apply 1 Application topically daily.  Dispense: 30 g; Refill: 0  - keep area clean and dry   2. Insomnia  May try melatonin   Follow up:  Follow up in 6 months complete physical     Fenton Foy, NP 07/22/2022

## 2022-09-14 ENCOUNTER — Telehealth: Payer: Self-pay

## 2022-09-14 NOTE — Telephone Encounter (Signed)
Patient called in today requesting refills of Fluocinolone  Acetonide Body 0.1% Oil as well as Ketoconazole 2% shampoo. Okay to send in refills?

## 2022-09-15 ENCOUNTER — Other Ambulatory Visit: Payer: Self-pay

## 2022-09-15 DIAGNOSIS — L409 Psoriasis, unspecified: Secondary | ICD-10-CM

## 2022-09-15 MED ORDER — FLUOCINOLONE ACETONIDE BODY 0.01 % EX OIL: TOPICAL_OIL | CUTANEOUS | 6 refills | Status: DC

## 2022-09-15 MED ORDER — KETOCONAZOLE 2 % EX SHAM: 1.0000 | MEDICATED_SHAMPOO | CUTANEOUS | 6 refills | Status: DC

## 2022-09-15 NOTE — Progress Notes (Signed)
Refill request from patient. Ok'd by Dr . Nicole Kindred. Escripted

## 2022-09-15 NOTE — Progress Notes (Signed)
Refill requested from patient. Ok'd by Dr. Nicole Kindred. Escripted

## 2022-09-29 ENCOUNTER — Ambulatory Visit (INDEPENDENT_AMBULATORY_CARE_PROVIDER_SITE_OTHER): Payer: 59 | Admitting: Dermatology

## 2022-09-29 ENCOUNTER — Encounter: Payer: Self-pay | Admitting: Dermatology

## 2022-09-29 DIAGNOSIS — L409 Psoriasis, unspecified: Secondary | ICD-10-CM

## 2022-09-29 DIAGNOSIS — L639 Alopecia areata, unspecified: Secondary | ICD-10-CM | POA: Diagnosis not present

## 2022-09-29 DIAGNOSIS — L700 Acne vulgaris: Secondary | ICD-10-CM

## 2022-09-29 MED ORDER — DOXYCYCLINE HYCLATE 100 MG PO CAPS: 100.0000 mg | ORAL_CAPSULE | Freq: Every day | ORAL | 3 refills | Status: AC

## 2022-09-29 MED ORDER — CLOBETASOL PROPIONATE 0.05 % EX CREA: TOPICAL_CREAM | CUTANEOUS | 2 refills | Status: DC

## 2022-09-29 MED ORDER — CLINDAMYCIN PHOS-BENZOYL PEROX 1-5 % EX GEL: CUTANEOUS | 2 refills | Status: DC

## 2022-09-29 MED ORDER — TRETINOIN 0.025 % EX CREA: TOPICAL_CREAM | CUTANEOUS | 2 refills | Status: DC

## 2022-09-29 MED ORDER — AMMONIUM LACTATE 12 % EX CREA: TOPICAL_CREAM | CUTANEOUS | 5 refills | Status: DC

## 2022-09-29 NOTE — Progress Notes (Signed)
Follow-Up Visit   Subjective  Rhonda Hester is a 46 y.o. female who presents for the following: Skin Problem (Right frontal scalp. C/O lingering lesion. Has had areas like this in the past but they do not last this long. Non tender, denies itching. Called for refills States she is having increased shortness of breath).  She has area of hair loss at right frontal scalp, no rash or itching.  She also rash on upper back and chest (not itchy), and her feet are itching again.  She is also getting more acne breakouts.    The following portions of the chart were reviewed this encounter and updated as appropriate:      Review of Systems: No other skin or systemic complaints except as noted in HPI or Assessment and Plan.   Objective  Well appearing patient in no apparent distress; mood and affect are within normal limits.  A focused examination was performed including head, neck. Relevant physical exam findings are noted in the Assessment and Plan.  back, feet, right wrist Hyperpigmented slightly violaceous patches with mild scale at upper back, right chest . B/L medial plantar feet hyperpigmented scaly plaques with diffuse hyperkeratosis. Hyperpigmented plaque at right wrist.  right frontal scalp 2.5cm patch of alopecia with some emerging white hairs at edge  face Hyperpigmented papules consistent with scattered comedones, inflammatory papules, and pustules.     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 Condition is bothersome/symptomatic for patient. Currently flared.   Discussed doing punch biopsy to rash on back to confirm dx, but patient defers.  Continue Humira injections as prescribed for sarcoid, pt to see PCP for shortness of breath   Use Clobetasol cream to affected areas on back, chest, arm and feet twice daily until  clear.  Start Lac-Hydrin 12% cream apply once or twice daily to feet. Apply after Clobetasol or before CeraVe ointment.  ammonium lactate (LAC-HYDRIN) 12 % cream - back, feet, right wrist Apply to feet once or twice daily  clobetasol cream (TEMOVATE) 0.05 % - back, feet, right wrist Apply to more severe areas psoriasis once to twice daily until improved. Avoid face, groin, underarms.  Related Medications pimecrolimus (ELIDEL) 1 % cream Apply to psoriasis on face/body 1-2 times daily.  ketoconazole (NIZORAL) 2 % shampoo Apply 1 Application topically 2 (two) times a week.  Alopecia areata right frontal scalp  Chronic and persistent condition with duration or expected duration over one year. Condition is bothersome/symptomatic for patient. Currently flared.   Alopecia areata is a chronic autoimmune condition localized to the skin which affects hair follicles and causes hair loss, most commonly in the scalp.  Cause is unknown.  Can be unpredictable, difficult to treat, and may recur.  Treatments may include topical and intralesional steroids to decrease inflammation to allow for hair regrowth.  Other treatments may include narrowband ultraviolet B light treatment; Squairic acid immunotherapy application; antihistamines and oral Jak inhibitors.   Use Clobetasol cream twice daily to affected area on scalp up to 4 weeks, then use once daily.  Avoid applying to face, groin or axilla Start Rogaine 5% nightly to affected area.  Recommend minoxidil 5% (Rogaine for men) solution or foam to be applied to the scalp and left in. This should ideally be used twice daily for best results but it helps with hair regrowth when used at least three times per  week. Rogaine initially can cause increased hair shedding for the first few weeks but this will stop with continued use. In studies, people who used minoxidil (Rogaine) for at least 6 months had thicker hair than people who did not. Minoxidil topical  (Rogaine) only works as long as it continues to be used. If if it is no longer used then the hair it has been helping to regrow can fall out. Minoxidil topical (Rogaine) can cause increased facial hair growth which can usually be managed easily with a battery-operated hair trimmer. If facial hair growth is bothersome, switching to the 2% women's version can decrease the risk of unwanted facial hair growth.   Acne vulgaris face  Chronic and persistent condition with duration or expected duration over one year. Condition is bothersome/symptomatic for patient. Currently flared.  restart Doxycycline '100mg'$  once daily with food. Use Clindamycin/Benzoyl Peroxide gel to face in morning.  Use Tretinoin 0.025% cream at bedtime to face, wash off in morning  Doxycycline should be taken with food to prevent nausea. Do not lay down for 30 minutes after taking. Be cautious with sun exposure and use good sun protection while on this medication. Pregnant women should not take this medication.    Topical retinoid medications like tretinoin/Retin-A, adapalene/Differin, tazarotene/Fabior, and Epiduo/Epiduo Forte can cause dryness and irritation when first started. Only apply a pea-sized amount to the entire affected area. Avoid applying it around the eyes, edges of mouth and creases at the nose. If you experience irritation, use a good moisturizer first and/or apply the medicine less often. If you are doing well with the medicine, you can increase how often you use it until you are applying every night. Be careful with sun protection while using this medication as it can make you sensitive to the sun. This medicine should not be used by pregnant women.    doxycycline (VIBRAMYCIN) 100 MG capsule - face Take 1 capsule (100 mg total) by mouth daily. Take with food  Related Medications clindamycin (CLEOCIN-T) 1 % lotion Apply in morning to face for acne  tretinoin (RETIN-A) 0.025 % cream Apply a pea-sized amount to  face every night as tolerated for acne.  clindamycin-benzoyl peroxide (BENZACLIN) gel Apply to face every morning for acne. Risk bleaching.   Return for Follow Up As Scheduled.  I, Emelia Salisbury, CMA, am acting as scribe for Brendolyn Patty, MD.  Documentation: I have reviewed the above documentation for accuracy and completeness, and I agree with the above.  Brendolyn Patty MD

## 2022-09-29 NOTE — Patient Instructions (Addendum)
Psoriasis/Eczema/Sarcoid Use Clobetasol cream to affected areas on back, chest and feet twice daily for itchy rash until clear  Start Lac-Hydrin 12% cream apply once or twice daily to feet. Apply after Clobetasol and before CeraVe ointment.  Scalp Use Clobetasol cream twice daily to affected area on scalp up to 4 weeks, then use once daily.   Continue Ketoconazole shampoo and Derma-Smoothe oil as directed/as needed.  Recommend minoxidil 5% (Rogaine for men) solution or foam to be applied to the scalp and left in. This should ideally be used twice daily for best results but it helps with hair regrowth when used at least three times per week. Rogaine initially can cause increased hair shedding for the first few weeks but this will stop with continued use. In studies, people who used minoxidil (Rogaine) for at least 6 months had thicker hair than people who did not. Minoxidil topical (Rogaine) only works as long as it continues to be used. If if it is no longer used then the hair it has been helping to regrow can fall out. Minoxidil topical (Rogaine) can cause increased facial hair growth which can usually be managed easily with a battery-operated hair trimmer. If facial hair growth is bothersome, switching to the 2% women's version can decrease the risk of unwanted facial hair growth.   Acne: Take Doxycycline '100mg'$  once daily with food. Use Clindamycin/Benzoyl Peroxide gel to face in morning.  Use Tretinoin cream at bedtime to face, wash off in morning  Doxycycline should be taken with food to prevent nausea. Do not lay down for 30 minutes after taking. Be cautious with sun exposure and use good sun protection while on this medication. Pregnant women should not take this medication.   Topical retinoid medications like tretinoin/Retin-A, adapalene/Differin, tazarotene/Fabior, and Epiduo/Epiduo Forte can cause dryness and irritation when first started. Only apply a pea-sized amount to the entire  affected area. Avoid applying it around the eyes, edges of mouth and creases at the nose. If you experience irritation, use a good moisturizer first and/or apply the medicine less often. If you are doing well with the medicine, you can increase how often you use it until you are applying every night. Be careful with sun protection while using this medication as it can make you sensitive to the sun. This medicine should not be used by pregnant women.   Due to recent changes in healthcare laws, you may see results of your pathology and/or laboratory studies on MyChart before the doctors have had a chance to review them. We understand that in some cases there may be results that are confusing or concerning to you. Please understand that not all results are received at the same time and often the doctors may need to interpret multiple results in order to provide you with the best plan of care or course of treatment. Therefore, we ask that you please give [place] 2 business days to thoroughly review all your results before contacting the office for clarification. Should we see a critical lab result, you will be contacted sooner.   If You Need Anything After Your Visit  If you have any questions or concerns for your doctor, please call our main line at [phone] and press option 4 to reach your doctor's medical assistant. If no one answers, please leave a voicemail as directed and we will return your call as soon as possible. Messages left after 4 pm will be answered the following business day.   You may also send [place] a  message via [place]. We typically respond to MyChart messages within 1-2 business days.  For prescription refills, please ask your pharmacy to contact our office. Our fax number is [phone].  If you have an urgent issue when the clinic is closed that cannot wait until the next business day, you can page your doctor at the number below.    Please note that while we do our best to be  available for urgent issues outside of office hours, we are not available 24/7.   If you have an urgent issue and are unable to reach [place], you may choose to seek medical care at your doctor's office, retail clinic, urgent care center, or emergency room.  If you have a medical emergency, please immediately call 911 or go to the emergency department.  Pager Numbers  - Dr. Nehemiah Massed: [phone]  - Dr. Laurence Ferrari: [phone]  - Dr. Nicole Kindred: [phone]  In the event of inclement weather, please call our main line at [phone] for an update on the status of any delays or closures.  Dermatology Medication Tips: Please keep the boxes that topical medications come in in order to help keep track of the instructions about where and how to use these. Pharmacies typically print the medication instructions only on the boxes and not directly on the medication tubes.   If your medication is too expensive, please contact our office at [phone] option 4 or send [place] a message through [place].   We are unable to tell what your co-pay for medications will be in advance as this is different depending on your insurance coverage. However, we may be able to find a substitute medication at lower cost or fill out paperwork to get insurance to cover a needed medication.   If a prior authorization is required to get your medication covered by your insurance company, please allow [place] 1-2 business days to complete this process.  Drug prices often vary depending on where the prescription is filled and some pharmacies may offer cheaper prices.  The website www.goodrx.com contains coupons for medications through different pharmacies. The prices here do not account for what the cost may be with help from insurance (it may be cheaper with your insurance), but the website can give you the price if you did not use any insurance.  - You can print the associated coupon and take it with your prescription to the pharmacy.  -  You may also stop by our office during regular business hours and pick up a GoodRx coupon card.  - If you need your prescription sent electronically to a different pharmacy, notify our office through [facility] or by phone at [phone] option 4.     Si Usted Necesita Algo Despus de Su Visita  Tambin puede enviarnos un mensaje a travs de Pharmacist, community. Por lo general respondemos a los mensajes de MyChart en el transcurso de 1 a 2 das hbiles.  Para renovar recetas, por favor pida a su farmacia que se ponga en contacto con nuestra oficina. Harland Dingwall de fax es [place] [phone].  Si tiene un asunto urgente cuando la clnica est cerrada y que no puede esperar hasta el siguiente da hbil, puede llamar/localizar a su doctor(a) al nmero que aparece a continuacin.   Por favor, tenga en cuenta que aunque hacemos todo lo posible para estar disponibles para asuntos urgentes fuera del horario de [place], no estamos disponibles las 24 horas del da, los 7 das de la [place].   Si tiene un problema urgente  y no puede comunicarse con nosotros, puede optar por buscar atencin mdica  en el consultorio de su doctor(a), en una clnica privada, en un centro de atencin urgente o en una sala de emergencias.  Si tiene Engineering geologist, por favor llame inmediatamente al 911 o vaya a la sala de emergencias.  Nmeros de bper  - Dr. Nehemiah Massed: [phone]  - Dra. Moye: [phone]  - Dra. Nicole Kindred: [phone]  En caso de inclemencias del [place], por favor llame a Johnsie Kindred principal al [phone] para una actualizacin sobre el [place] de cualquier retraso o cierre.  Consejos para la medicacin en dermatologa: Por favor, guarde las cajas en las que vienen los medicamentos de uso tpico para ayudarle a seguir las instrucciones sobre dnde y cmo usarlos. Las farmacias generalmente imprimen las instrucciones del medicamento slo en las cajas y no directamente en los tubos del  [place].   Si su medicamento es muy caro, por favor, pngase en contacto con Zigmund Daniel llamando al [phone] y presione la opcin 4 o envenos un mensaje a travs de Pharmacist, community.   No podemos decirle cul ser su copago por los medicamentos por adelantado ya que esto es diferente dependiendo de la cobertura de su seguro. Sin embargo, es posible que podamos encontrar un medicamento sustituto a Electrical engineer un formulario para que el seguro cubra el medicamento que se considera necesario.   Si se requiere una autorizacin previa para que su compaa de seguros [place] su medicamento, por favor permtanos de 1 a 2 das hbiles para completar este proceso.  Los precios de los medicamentos varan con frecuencia dependiendo del Environmental consultant de dnde se surte la receta y alguna farmacias pueden ofrecer precios ms baratos.  El sitio web www.goodrx.com tiene cupones para medicamentos de Airline pilot. Los precios aqu no tienen en cuenta lo que podra costar con la ayuda del seguro (puede ser ms barato con su seguro), pero el sitio web puede darle el precio si no utiliz Research scientist (physical sciences).  - Puede imprimir el cupn correspondiente y llevarlo con su receta a la farmacia.  - Tambin puede pasar por nuestra oficina durante el horario de atencin regular y Charity fundraiser una tarjeta de cupones de GoodRx.  - Si necesita que su receta se enve electrnicamente a una farmacia diferente, informe a nuestra oficina a travs de MyChart de [place] o por telfono llamando al [phone] y presione la opcin 4.

## 2022-09-30 ENCOUNTER — Ambulatory Visit (INDEPENDENT_AMBULATORY_CARE_PROVIDER_SITE_OTHER): Payer: 59 | Admitting: Nurse Practitioner

## 2022-09-30 DIAGNOSIS — Z3042 Encounter for surveillance of injectable contraceptive: Secondary | ICD-10-CM

## 2022-09-30 LAB — POCT URINE PREGNANCY: Preg Test, Ur: NEGATIVE

## 2022-09-30 MED ORDER — MEDROXYPROGESTERONE ACETATE 150 MG/ML IM SUSP
150.0000 mg | Freq: Once | INTRAMUSCULAR | Status: AC
  Administered 2022-09-30: 150 mg via INTRAMUSCULAR

## 2022-10-18 ENCOUNTER — Encounter: Payer: Self-pay | Admitting: Nurse Practitioner

## 2022-10-18 ENCOUNTER — Ambulatory Visit (INDEPENDENT_AMBULATORY_CARE_PROVIDER_SITE_OTHER): Payer: 59 | Admitting: Nurse Practitioner

## 2022-10-18 VITALS — BP 131/94 | HR 94 | Temp 98.4°F | Ht 67.0 in | Wt 228.6 lb

## 2022-10-18 DIAGNOSIS — J40 Bronchitis, not specified as acute or chronic: Secondary | ICD-10-CM | POA: Diagnosis not present

## 2022-10-18 DIAGNOSIS — E669 Obesity, unspecified: Secondary | ICD-10-CM | POA: Diagnosis not present

## 2022-10-18 MED ORDER — BENZONATATE 100 MG PO CAPS: 100.0000 mg | ORAL_CAPSULE | Freq: Two times a day (BID) | ORAL | 0 refills | Status: DC | PRN

## 2022-10-18 MED ORDER — METHYLPREDNISOLONE 4 MG PO TBPK: ORAL_TABLET | ORAL | 0 refills | Status: DC

## 2022-10-18 MED ORDER — AZITHROMYCIN 250 MG PO TABS: ORAL_TABLET | ORAL | 0 refills | Status: AC

## 2022-10-18 NOTE — Assessment & Plan Note (Signed)
Rx Medrol dose pack 4 mg tablets, take as instructed  2. Bronchitis  - azithromycin (ZITHROMAX) 250 MG tablet; Take 2 tablets on day 1, then 1 tablet daily on days 2 through 5  Dispense: 6 tablet; Refill: 0 - methylPREDNISolone (MEDROL DOSEPAK) 4 MG TBPK tablet; Take as instructed on the packaging  Dispense: 1 each; Refill: 0  Smoking cessation education completed patient verbalized understanding

## 2022-10-18 NOTE — Assessment & Plan Note (Addendum)
Wt Readings from Last 3 Encounters:  10/18/22 228 lb 9.6 oz (103.7 kg)  07/22/22 228 lb 9.6 oz (103.7 kg)  04/21/22 233 lb 3.2 oz (105.8 kg)  she has been laying off sugar, sweets, she has not been able to engage in regular exercises due to her chronic pain Patient counseled on low-carb modified diet she was encouraged to engage in regular moderate exercise with at least 250 minutes weekly as tolerated

## 2022-10-18 NOTE — Progress Notes (Signed)
Established Patient Office Visit  Subjective:  Patient ID: Rhonda Hester, female    DOB: Mar 01, 1976  Age: 46 y.o. MRN: [id number]  CC:  Chief Complaint  Patient presents with   Cough    HPI Rhonda Hester is a 46 y.o. female with past medical history of sarcoidosis, GERD, asthma, obesity, presents with complaints of productive cough with brownish colored sputum, wheezing since the last 2 months.  Patient denies fever, chills, bloody sputum.  She smokes Black MiLd since the pandemic .  She has been using albuterol inhaler as needed for wheezing.  She has also tried taking over-the-counter cough medication without improvement  Due for flu vaccine patient declined flu vaccine today patient was encouraged to consider getting the flu vaccine and pneumonia vaccine she verbalized understanding.    Past Medical History:  Diagnosis Date   Allergic asthma    Allergy    SEASONAL   Anxiety    Breast mass 05/2017   lt breast lump   Eczema    Fibroid    GERD (gastroesophageal reflux disease)    History of anal fissures    History of Bell's palsy    2006  RIGHT SIDE--  RESOLVED   History of gastroesophageal reflux (GERD)    History of kidney stones    Hyperlipidemia    Interstitial cystitis    Psoriasis    Sarcoidosis of lung ([place])    DX 2006--  PULMOLOGIST--  DR NLGX- on 2 liters of oxygen, goes to pain clinic   Seasonal allergic rhinitis    Shortness of breath    Vaginal Pap smear, abnormal     Past Surgical History:  Procedure Laterality Date   CYSTO WITH HYDRODISTENSION N/A 08/24/2013   Procedure: CYSTOSCOPY/HYDRODISTENSION with instillation of marcaine and pyridium;  Surgeon: Fredricka Bonine, MD;  Location: [facility];  Service: Urology;  Laterality: N/A;   CYSTOSCOPY/ HYDRODISTENTION/ BLADDER BX  06-09-2010   CYSTOSCOPY/URETEROSCOPY/HOLMIUM LASER/STENT PLACEMENT Right 01/30/2019   Procedure: CYSTOSCOPY/RETROGRADE/URETEROSCOPY/HOLMIUM  LASER/STENT PLACEMENT;  Surgeon: Festus Aloe, MD;  Location: [facility];  Service: Urology;  Laterality: Right;   DILATION AND CURETTAGE OF UTERUS  1997   EXTRACORPOREAL SHOCK WAVE LITHOTRIPSY Right 11/30/2018   Procedure: EXTRACORPOREAL SHOCK WAVE LITHOTRIPSY (ESWL);  Surgeon: Bjorn Loser, MD;  Location: [facility];  Service: Urology;  Laterality: Right;   IR TRANSCATHETER BX  06/14/2018   IR US GUIDE VASC ACCESS RIGHT  06/14/2018   IR VENOGRAM HEPATIC W HEMODYNAMIC EVALUATION  06/14/2018    Family History  Problem Relation Age of Onset   Brain cancer Maternal Grandmother    Hyperlipidemia Other    Sleep apnea Other    Depression Maternal Aunt    Seizures Maternal Uncle    Colon cancer Neg Hx    Stomach cancer Neg Hx     Social History   Socioeconomic History   Marital status: Single    Spouse name: Not on file   Number of children: 0   Years of education: Not on file   Highest education level: Some college, no degree  Occupational History   Occupation: disabled  Tobacco Use   Smoking status: Some Days    Packs/day: 0.30    Years: 6.00    Total pack years: 1.80    Types: Cigars, Cigarettes    Last attempt to quit: 11/2016    Years since quitting: 5.8   Smokeless tobacco: Never   Tobacco comments:    "Smokes 1 black and  mild occasionally"  Vaping Use   Vaping Use: Never used  Substance and Sexual Activity   Alcohol use: No    Alcohol/week: 0.0 standard drinks of alcohol   Drug use: No    Comment: HX MARIJUANA USE   Sexual activity: Not Currently    Birth control/protection: None    Comment: abstinence   Other Topics Concern   Not on file  Social History Narrative   Lives alone.   Social Determinants of Health   Financial Resource Strain: Low Risk  (09/16/2021)   Overall Financial Resource Strain (CARDIA)    Difficulty of Paying Living Expenses: Not very hard  Food Insecurity: No Food Insecurity (09/16/2021)   Hunger Vital Sign    Worried About Running Out  of Food in the Last Year: Never true    Ran Out of Food in the Last Year: Never true  Transportation Needs: No Transportation Needs (09/16/2021)   PRAPARE - Hydrologist (Medical): No    Lack of Transportation (Non-Medical): No  Physical Activity: Inactive (09/16/2021)   Exercise Vital Sign    Days of Exercise per Week: 0 days    Minutes of Exercise per Session: 0 min  Stress: Stress Concern Present (09/16/2021)   [place]    Feeling of Stress : To some extent  Social Connections: Socially Isolated (09/16/2021)   Social Connection and Isolation Panel [NHANES]    Frequency of Communication with Friends and Family: Once a week    Frequency of Social Gatherings with Friends and Family: Once a week    Attends Religious Services: Never    Marine scientist or Organizations: No    Attends Archivist Meetings: Never    Marital Status: Never married  Intimate Partner Violence: Not At Risk (09/16/2021)   Humiliation, Afraid, Rape, and Kick questionnaire    Fear of Current or Ex-Partner: No    Emotionally Abused: No    Physically Abused: No    Sexually Abused: No    Outpatient Medications Prior to Visit  Medication Sig Dispense Refill   Adalimumab 40 MG/0.8ML PNKT Inject into the skin.     albuterol (PROAIR HFA) 108 (90 Base) MCG/ACT inhaler Inhale 2 puffs into the lungs every 6 (six) hours as needed for shortness of breath. 1 Inhaler 5   cyclobenzaprine (FLEXERIL) 10 MG tablet Take 1 tablet (10 mg total) by mouth 2 (two) times daily as needed for muscle spasms. 20 tablet 0   tiZANidine (ZANAFLEX) 4 MG tablet Take 4 mg by mouth 3 (three) times daily.     ammonium lactate (LAC-HYDRIN) 12 % cream Apply to feet once or twice daily (Patient not taking: Reported on 10/18/2022) 385 g 5   calcipotriene-betamethasone (TACLONEX) ointment Apply to body qhs for psoriasis (Patient not taking:  Reported on 07/22/2022) 60 g 1   clindamycin (CLEOCIN-T) 1 % lotion Apply in morning to face for acne (Patient not taking: Reported on 07/22/2022) 60 mL 3   clindamycin-benzoyl peroxide (BENZACLIN) gel Apply to face every morning for acne. Risk bleaching. (Patient not taking: Reported on 10/18/2022) 50 g 2   clobetasol cream (TEMOVATE) 0.05 % Apply to more severe areas psoriasis once to twice daily until improved. Avoid face, groin, underarms. (Patient not taking: Reported on 10/18/2022) 60 g 2   clotrimazole-betamethasone (LOTRISONE) cream Apply 1 application topically 2 (two) times daily. (Patient not taking: Reported on 02/22/2022) 30 g 0  clotrimazole-betamethasone (LOTRISONE) cream Apply 1 Application topically daily. (Patient not taking: Reported on 10/18/2022) 30 g 0   doxycycline (VIBRAMYCIN) 100 MG capsule Take 1 capsule (100 mg total) by mouth daily. Take with food (Patient not taking: Reported on 10/18/2022) 30 capsule 3   DULERA 200-5 MCG/ACT AERO Inhale 2 puffs into the lungs 2 (two) times daily. (Patient not taking: Reported on 10/18/2022)     DULoxetine (CYMBALTA) 30 MG capsule Take 30 mg by mouth 2 (two) times daily. (Patient not taking: Reported on 07/22/2022)     Fluocinolone Acetonide Body 0.01 % OIL Apply topically as needed (Patient not taking: Reported on 10/18/2022) 147.82 mL 6   folic acid (FOLVITE) 1 MG tablet Take 1 mg by mouth daily. (Patient not taking: Reported on 07/22/2022)     ibuprofen (ADVIL) 200 MG tablet Take 600-800 mg by mouth every 6 (six) hours as needed for moderate pain. (Patient not taking: Reported on 10/18/2022)     ketoconazole (NIZORAL) 2 % shampoo Apply 1 Application topically 2 (two) times a week. (Patient not taking: Reported on 10/18/2022) 120 mL 6   LOFENA 25 MG TABS Take by mouth. (Patient not taking: Reported on 07/22/2022)     methotrexate (RHEUMATREX) 2.5 MG tablet Take 2.5 mg by mouth every Wednesday. Caution: Chemotherapy. Protect from light.  (Patient not taking: Reported on 07/22/2022)     neomycin-polymyxin-hydrocortisone (CORTISPORIN) OTIC solution Place 3 drops into both ears 4 (four) times daily. (Patient not taking: Reported on 07/22/2022) 10 mL 0   ondansetron (ZOFRAN ODT) 4 MG disintegrating tablet Take 1 tablet (4 mg total) by mouth every 8 (eight) hours as needed for nausea or vomiting. (Patient not taking: Reported on 09/16/2021) 20 tablet 0   oxyCODONE (ROXICODONE) 5 MG immediate release tablet Take 1 tablet (5 mg total) by mouth every 6 (six) hours as needed for up to 6 doses for severe pain or breakthrough pain. (Patient not taking: Reported on 10/18/2022) 6 tablet 0   pimecrolimus (ELIDEL) 1 % cream Apply to psoriasis on face/body 1-2 times daily. (Patient not taking: Reported on 02/22/2022) 60 g 2   tretinoin (RETIN-A) 0.025 % cream Apply a pea-sized amount to face every night as tolerated for acne. (Patient not taking: Reported on 10/18/2022) 45 g 2   Facility-Administered Medications Prior to Visit  Medication Dose Route Frequency Provider Last Rate Last Admin   bupivacaine (MARCAINE) 0.5 % 15 mL, phenazopyridine (PYRIDIUM) 400 mg bladder mixture   Bladder Instillation Once Festus Aloe, MD        Allergies  Allergen Reactions   Acyclovir And Related Other (See Comments)    Patient states it "made me feel like I can't breath"    Bee Pollen Itching and Other (See Comments)     coughing   Citric Acid Other (See Comments)    AVOIDS DUE TO INTERSTITIAL CYSTITIS   Dust Mite Extract Itching and Other (See Comments)     coughing   Grapeseed Extract [Nutritional Supplements] Other (See Comments)    Throat itching and burning   Pineapple Itching    Burning in mouth and tingling lips   Pollen Extract Itching and Other (See Comments)     coughing   Nickel Rash    ROS Review of Systems  Constitutional:  Negative for activity change, appetite change, chills, diaphoresis, fatigue, fever and unexpected weight change.   HENT:  Negative for ear discharge, ear pain, facial swelling, hearing loss, sinus pressure and sneezing.   Respiratory:  Positive for cough and wheezing.   Cardiovascular:  Negative for chest pain, palpitations and leg swelling.  Neurological: Negative.  Negative for dizziness and facial asymmetry.  Psychiatric/Behavioral:  Negative for agitation, confusion, decreased concentration, dysphoric mood and hallucinations.       Objective:    Physical Exam Constitutional:      General: She is not in acute distress.    Appearance: She is obese. She is not ill-appearing, toxic-appearing or diaphoretic.  Cardiovascular:     Rate and Rhythm: Normal rate and regular rhythm.     Pulses: Normal pulses.     Heart sounds: Normal heart sounds. No murmur heard.    No friction rub. No gallop.  Pulmonary:     Effort: No respiratory distress.     Breath sounds: No stridor. Wheezing present. No rhonchi or rales.  Abdominal:     General: There is no distension.     Palpations: Abdomen is soft.     Tenderness: There is no abdominal tenderness.  Musculoskeletal:     Right lower leg: No edema.     Left lower leg: No edema.  Skin:    General: Skin is warm and dry.  Neurological:     Mental Status: She is alert and oriented to person, place, and time.     Cranial Nerves: No cranial nerve deficit.     Sensory: No sensory deficit.     Motor: No weakness.     Gait: Gait normal.  Psychiatric:        Mood and Affect: Mood normal.        Behavior: Behavior normal.        Thought Content: Thought content normal.        Judgment: Judgment normal.     BP (!) 131/94   Pulse 94   Temp 98.4 F (36.9 C) (Oral)   Ht '5\' 7"'$  (1.702 m)   Wt 228 lb 9.6 oz (103.7 kg)   SpO2 99%   BMI 35.80 kg/m  Wt Readings from Last 3 Encounters:  10/18/22 228 lb 9.6 oz (103.7 kg)  07/22/22 228 lb 9.6 oz (103.7 kg)  04/21/22 233 lb 3.2 oz (105.8 kg)    Lab Results  Component Value Date   TSH 2.330 05/04/2021    Lab Results  Component Value Date   WBC 7.3 09/22/2021   HGB 13.1 09/22/2021   HCT 39.7 09/22/2021   MCV 86.9 09/22/2021   PLT 288 09/22/2021   Lab Results  Component Value Date   NA 139 09/22/2021   K 3.3 (L) 09/22/2021   CO2 24 09/22/2021   GLUCOSE 115 (H) 09/22/2021   BUN 7 09/22/2021   CREATININE 0.65 09/22/2021   BILITOT 0.7 07/19/2021   ALKPHOS 68 07/19/2021   AST 19 07/19/2021   ALT 22 07/19/2021   PROT 6.9 07/19/2021   ALBUMIN 3.6 07/19/2021   CALCIUM 9.7 09/22/2021   ANIONGAP 10 09/22/2021   GFR 138.73 04/25/2018   Lab Results  Component Value Date   CHOL 205 (H) 05/04/2021   Lab Results  Component Value Date   HDL 54 05/04/2021   Lab Results  Component Value Date   LDLCALC 130 (H) 05/04/2021   Lab Results  Component Value Date   TRIG 118 05/04/2021   Lab Results  Component Value Date   CHOLHDL 3.8 05/04/2021   Lab Results  Component Value Date   HGBA1C 4.9 05/10/2018      Assessment & Plan:   Problem List  Items Addressed This Visit       Respiratory   Bronchitis    Rx Medrol dose pack 4 mg tablets, take as instructed  2. Bronchitis  - azithromycin (ZITHROMAX) 250 MG tablet; Take 2 tablets on day 1, then 1 tablet daily on days 2 through 5  Dispense: 6 tablet; Refill: 0 - methylPREDNISolone (MEDROL DOSEPAK) 4 MG TBPK tablet; Take as instructed on the packaging  Dispense: 1 each; Refill: 0  Smoking cessation education completed patient verbalized understanding      Relevant Medications   azithromycin (ZITHROMAX) 250 MG tablet   methylPREDNISolone (MEDROL DOSEPAK) 4 MG TBPK tablet     Other   Obesity (BMI 30-39.9) - Primary    Wt Readings from Last 3 Encounters:  10/18/22 228 lb 9.6 oz (103.7 kg)  07/22/22 228 lb 9.6 oz (103.7 kg)  04/21/22 233 lb 3.2 oz (105.8 kg)  she has been laying off sugar, sweets, she has not been able to engage in regular exercises due to her chronic pain Patient counseled on low-carb modified diet she  was encouraged to engage in regular moderate exercise with at least 250 minutes weekly as tolerated       Meds ordered this encounter  Medications   azithromycin (ZITHROMAX) 250 MG tablet    Sig: Take 2 tablets on day 1, then 1 tablet daily on days 2 through 5    Dispense:  6 tablet    Refill:  0   methylPREDNISolone (MEDROL DOSEPAK) 4 MG TBPK tablet    Sig: Take as instructed on the packaging    Dispense:  1 each    Refill:  0   benzonatate (TESSALON) 100 MG capsule    Sig: Take 1 capsule (100 mg total) by mouth 2 (two) times daily as needed for cough.    Dispense:  20 capsule    Refill:  0    Follow-up: No follow-ups on file.    Renee Rival, FNP

## 2022-10-18 NOTE — Patient Instructions (Addendum)
  2. Bronchitis  - azithromycin (ZITHROMAX) 250 MG tablet; Take 2 tablets on day 1, then 1 tablet daily on days 2 through 5  Dispense: 6 tablet; Refill: 0 - methylPREDNISolone (MEDROL DOSEPAK) 4 MG TBPK tablet; Take as instructed on the packaging  Dispense: 1 each; Refill: 0   Take Tessalon 100 mg 2 times daily as needed for cough continue albuterol inhaler 2 puffs every 6 hours as needed for wheezing  Please consider getting a flu vaccine and pneumonia  vaccine as discussed, please work on quitting black ML,   It is important that you exercise regularly at least 30 minutes 5 times a week as tolerated  Think about what you will eat, plan ahead. Choose " clean, green, fresh or frozen" over canned, processed or packaged foods which are more sugary, salty and fatty. 70 to 75% of food eaten should be vegetables and fruit. Three meals at set times with snacks allowed between meals, but they must be fruit or vegetables. Aim to eat over a 12 hour period , example 7 am to 7 pm, and STOP after  your last meal of the day. Drink water,generally about 64 ounces per day, no other drink is as healthy. Fruit juice is best enjoyed in a healthy way, by EATING the fruit.  Thanks for choosing [facility] we consider it a privelige to serve you.

## 2022-10-18 NOTE — Progress Notes (Signed)
Has had cough, states that she has been wheezing. Numbness in legs.

## 2022-10-26 ENCOUNTER — Ambulatory Visit: Payer: 59 | Admitting: Nurse Practitioner

## 2022-12-23 ENCOUNTER — Ambulatory Visit (INDEPENDENT_AMBULATORY_CARE_PROVIDER_SITE_OTHER): Payer: 59

## 2022-12-23 DIAGNOSIS — Z3042 Encounter for surveillance of injectable contraceptive: Secondary | ICD-10-CM

## 2022-12-23 MED ORDER — MEDROXYPROGESTERONE ACETATE 150 MG/ML IM SUSP
150.0000 mg | Freq: Once | INTRAMUSCULAR | Status: AC
  Administered 2022-12-23: 150 mg via INTRAMUSCULAR

## 2022-12-30 ENCOUNTER — Ambulatory Visit (INDEPENDENT_AMBULATORY_CARE_PROVIDER_SITE_OTHER): Payer: 59

## 2022-12-30 VITALS — Ht 66.0 in | Wt 224.0 lb

## 2022-12-30 DIAGNOSIS — Z0000 Encounter for general adult medical examination without abnormal findings: Secondary | ICD-10-CM | POA: Diagnosis not present

## 2022-12-30 NOTE — Patient Instructions (Signed)
Rhonda Hester , Thank you for taking time to come for your Medicare Wellness Visit. I appreciate your ongoing commitment to your health goals. Please review the following plan we discussed and let me know if I can assist you in the future.   These are the goals we discussed:  Goals      Gain weight        This is a list of the screening recommended for you and due dates:  Health Maintenance  Topic Date Due   COVID-19 Vaccine (1) Never done   HPV Vaccine (2 - Risk 3-dose SCDM series) 12/13/2019   Medicare Annual Wellness Visit  09/16/2022   Flu Shot  03/27/2023*   Mammogram  02/09/2023   Colon Cancer Screening  09/22/2023   Pap Smear  07/07/2024   DTaP/Tdap/Td vaccine (7 - Td or Tdap) 05/10/2026   Hepatitis C Screening: USPSTF Recommendation to screen - Ages 23-79 yo.  Completed   HIV Screening  Completed  *Topic was postponed. The date shown is not the original due date.    Advanced directives: Advance directive discussed with you today.   Conditions/risks identified: none  Next appointment: Follow up in one year for your annual wellness visit.   Preventive Care 40-64 Years, Female Preventive care refers to lifestyle choices and visits with your health care provider that can promote health and wellness. What does preventive care include? A yearly physical exam. This is also called an annual well check. Dental exams once or twice a year. Routine eye exams. Ask your health care provider how often you should have your eyes checked. Personal lifestyle choices, including: Daily care of your teeth and gums. Regular physical activity. Eating a healthy diet. Avoiding tobacco and drug use. Limiting alcohol use. Practicing safe sex. Taking low-dose aspirin daily starting at age 41. Taking vitamin and mineral supplements as recommended by your health care provider. What happens during an annual well check? The services and screenings done by your health care provider during your  annual well check will depend on your age, overall health, lifestyle risk factors, and family history of disease. Counseling  Your health care provider may ask you questions about your: Alcohol use. Tobacco use. Drug use. Emotional well-being. Home and relationship well-being. Sexual activity. Eating habits. Work and work Statistician. Method of birth control. Menstrual cycle. Pregnancy history. Screening  You may have the following tests or measurements: Height, weight, and BMI. Blood pressure. Lipid and cholesterol levels. These may be checked every 5 years, or more frequently if you are over 66 years old. Skin check. Lung cancer screening. You may have this screening every year starting at age 23 if you have a 30-pack-year history of smoking and currently smoke or have quit within the past 15 years. Fecal occult blood test (FOBT) of the stool. You may have this test every year starting at age 89. Flexible sigmoidoscopy or colonoscopy. You may have a sigmoidoscopy every 5 years or a colonoscopy every 10 years starting at age 12. Hepatitis C blood test. Hepatitis B blood test. Sexually transmitted disease (STD) testing. Diabetes screening. This is done by checking your blood sugar (glucose) after you have not eaten for a while (fasting). You may have this done every 1-3 years. Mammogram. This may be done every 1-2 years. Talk to your health care provider about when you should start having regular mammograms. This may depend on whether you have a family history of breast cancer. BRCA-related cancer screening. This may be done if  you have a family history of breast, ovarian, tubal, or peritoneal cancers. Pelvic exam and Pap test. This may be done every 3 years starting at age 29. Starting at age 57, this may be done every 5 years if you have a Pap test in combination with an HPV test. Bone density scan. This is done to screen for osteoporosis. You may have this scan if you are at high  risk for osteoporosis. Discuss your test results, treatment options, and if necessary, the need for more tests with your health care provider. Vaccines  Your health care provider may recommend certain vaccines, such as: Influenza vaccine. This is recommended every year. Tetanus, diphtheria, and acellular pertussis (Tdap, Td) vaccine. You may need a Td booster every 10 years. Zoster vaccine. You may need this after age 11. Pneumococcal 13-valent conjugate (PCV13) vaccine. You may need this if you have certain conditions and were not previously vaccinated. Pneumococcal polysaccharide (PPSV23) vaccine. You may need one or two doses if you smoke cigarettes or if you have certain conditions. Talk to your health care provider about which screenings and vaccines you need and how often you need them. This information is not intended to replace advice given to you by your health care provider. Make sure you discuss any questions you have with your health care provider. Document Released: 01/09/2016 Document Revised: 09/01/2016 Document Reviewed: 10/14/2015 Elsevier Interactive Patient Education  2017 [place] Prevention in the Home Falls can cause injuries. They can happen to people of all ages. There are many things you can do to make your home safe and to help prevent falls. What can I do on the outside of my home? Regularly fix the edges of walkways and driveways and fix any cracks. Remove anything that might make you trip as you walk through a door, such as a raised step or threshold. Trim any bushes or trees on the path to your home. Use bright outdoor lighting. Clear any walking paths of anything that might make someone trip, such as rocks or tools. Regularly check to see if handrails are loose or broken. Make sure that both sides of any steps have handrails. Any raised decks and porches should have guardrails on the edges. Have any leaves, snow, or ice cleared regularly. Use  sand or salt on walking paths during winter. Clean up any spills in your garage right away. This includes oil or grease spills. What can I do in the bathroom? Use night lights. Install grab bars by the toilet and in the tub and shower. Do not use towel bars as grab bars. Use non-skid mats or decals in the tub or shower. If you need to sit down in the shower, use a plastic, non-slip stool. Keep the floor dry. Clean up any water that spills on the floor as soon as it happens. Remove soap buildup in the tub or shower regularly. Attach bath mats securely with double-sided non-slip rug tape. Do not have throw rugs and other things on the floor that can make you trip. What can I do in the bedroom? Use night lights. Make sure that you have a light by your bed that is easy to reach. Do not use any sheets or blankets that are too big for your bed. They should not hang down onto the floor. Have a firm chair that has side arms. You can use this for support while you get dressed. Do not have throw rugs and other things on the floor  that can make you trip. What can I do in the kitchen? Clean up any spills right away. Avoid walking on wet floors. Keep items that you use a lot in easy-to-reach places. If you need to reach something above you, use a strong step stool that has a grab bar. Keep electrical cords out of the way. Do not use floor polish or wax that makes floors slippery. If you must use wax, use non-skid floor wax. Do not have throw rugs and other things on the floor that can make you trip. What can I do with my stairs? Do not leave any items on the stairs. Make sure that there are handrails on both sides of the stairs and use them. Fix handrails that are broken or loose. Make sure that handrails are as long as the stairways. Check any carpeting to make sure that it is firmly attached to the stairs. Fix any carpet that is loose or worn. Avoid having throw rugs at the top or bottom of the  stairs. If you do have throw rugs, attach them to the floor with carpet tape. Make sure that you have a light switch at the top of the stairs and the bottom of the stairs. If you do not have them, ask someone to add them for you. What else can I do to help prevent falls? Wear shoes that: Do not have high heels. Have rubber bottoms. Are comfortable and fit you well. Are closed at the toe. Do not wear sandals. If you use a stepladder: Make sure that it is fully opened. Do not climb a closed stepladder. Make sure that both sides of the stepladder are locked into place. Ask someone to hold it for you, if possible. Clearly mark and make sure that you can see: Any grab bars or handrails. First and last steps. Where the edge of each step is. Use tools that help you move around (mobility aids) if they are needed. These include: Canes. Walkers. Scooters. Crutches. Turn on the lights when you go into a dark area. Replace any light bulbs as soon as they burn out. Set up your furniture so you have a clear path. Avoid moving your furniture around. If any of your floors are uneven, fix them. If there are any pets around you, be aware of where they are. Review your medicines with your doctor. Some medicines can make you feel dizzy. This can increase your chance of falling. Ask your doctor what other things that you can do to help prevent falls. This information is not intended to replace advice given to you by your health care provider. Make sure you discuss any questions you have with your health care provider. Document Released: 10/09/2009 Document Revised: 05/20/2016 Document Reviewed: 01/17/2015 Elsevier Interactive Patient Education  2017 [employer].

## 2022-12-30 NOTE — Progress Notes (Signed)
I connected with Rhonda Hester today by telephone and verified that I am speaking with the correct person using two identifiers. Location patient: home Location provider: work Persons participating in the virtual visit: Chaela, Branscum LPN.   I discussed the limitations, risks, security and privacy concerns of performing an evaluation and management service by telephone and the availability of in person appointments. I also discussed with the patient that there may be a patient responsible charge related to this service. The patient expressed understanding and verbally consented to this telephonic visit.    Interactive audio and video telecommunications were attempted between this provider and patient, however failed, due to patient having technical difficulties OR patient did not have access to video capability.  We continued and completed visit with audio only.     Vital signs may be patient reported or missing.  Subjective:   Rhonda Hester is a 47 y.o. female who presents for Medicare Annual (Subsequent) preventive examination.  Review of Systems     Cardiac Risk Factors include: obesity (BMI >30kg/m2)     Objective:    Today's Vitals   12/30/22 1546 12/30/22 1547  Weight: 224 lb (101.6 kg)   Height: '5\' 6"'$  (1.676 m)   PainSc:  8    Body mass index is 36.15 kg/m.     12/30/2022    4:02 PM 09/22/2021    8:48 PM 09/16/2021    3:53 PM 07/01/2021    2:57 PM 11/30/2020    6:54 PM 01/30/2019    9:27 AM 01/29/2019   11:42 AM  Advanced Directives  Does Patient Have a Medical Advance Directive? No No No No No No No  Would patient like information on creating a medical advance directive?  No - Patient declined No - Patient declined  No - Patient declined No - Patient declined     Current Medications (verified) Outpatient Encounter Medications as of 12/30/2022  Medication Sig   Adalimumab 40 MG/0.8ML PNKT Inject into the skin.   albuterol (PROAIR HFA) 108 (90  Base) MCG/ACT inhaler Inhale 2 puffs into the lungs every 6 (six) hours as needed for shortness of breath.   benzonatate (TESSALON) 100 MG capsule Take 1 capsule (100 mg total) by mouth 2 (two) times daily as needed for cough.   cyclobenzaprine (FLEXERIL) 10 MG tablet Take 1 tablet (10 mg total) by mouth 2 (two) times daily as needed for muscle spasms.   DULERA 200-5 MCG/ACT AERO Inhale 2 puffs into the lungs 2 (two) times daily.   ibuprofen (ADVIL) 200 MG tablet Take 600-800 mg by mouth every 6 (six) hours as needed for moderate pain.   oxyCODONE (ROXICODONE) 5 MG immediate release tablet Take 1 tablet (5 mg total) by mouth every 6 (six) hours as needed for up to 6 doses for severe pain or breakthrough pain.   tiZANidine (ZANAFLEX) 4 MG tablet Take 4 mg by mouth 3 (three) times daily.   ammonium lactate (LAC-HYDRIN) 12 % cream Apply to feet once or twice daily (Patient not taking: Reported on 10/18/2022)   calcipotriene-betamethasone (TACLONEX) ointment Apply to body qhs for psoriasis (Patient not taking: Reported on 07/22/2022)   clindamycin (CLEOCIN-T) 1 % lotion Apply in morning to face for acne (Patient not taking: Reported on 07/22/2022)   clindamycin-benzoyl peroxide (BENZACLIN) gel Apply to face every morning for acne. Risk bleaching. (Patient not taking: Reported on 10/18/2022)   clobetasol cream (TEMOVATE) 0.05 % Apply to more severe areas psoriasis once to twice daily  until improved. Avoid face, groin, underarms. (Patient not taking: Reported on 10/18/2022)   clotrimazole-betamethasone (LOTRISONE) cream Apply 1 application topically 2 (two) times daily. (Patient not taking: Reported on 02/22/2022)   clotrimazole-betamethasone (LOTRISONE) cream Apply 1 Application topically daily. (Patient not taking: Reported on 10/18/2022)   DULoxetine (CYMBALTA) 30 MG capsule Take 30 mg by mouth 2 (two) times daily. (Patient not taking: Reported on 12/30/2022)   Fluocinolone Acetonide Body 0.01 % OIL Apply  topically as needed (Patient not taking: Reported on 44/12/270)   folic acid (FOLVITE) 1 MG tablet Take 1 mg by mouth daily. (Patient not taking: Reported on 07/22/2022)   ketoconazole (NIZORAL) 2 % shampoo Apply 1 Application topically 2 (two) times a week. (Patient not taking: Reported on 10/18/2022)   LOFENA 25 MG TABS Take by mouth. (Patient not taking: Reported on 07/22/2022)   methotrexate (RHEUMATREX) 2.5 MG tablet Take 2.5 mg by mouth every Wednesday. Caution: Chemotherapy. Protect from light. (Patient not taking: Reported on 07/22/2022)   methylPREDNISolone (MEDROL DOSEPAK) 4 MG TBPK tablet Take as instructed on the packaging (Patient not taking: Reported on 12/30/2022)   neomycin-polymyxin-hydrocortisone (CORTISPORIN) OTIC solution Place 3 drops into both ears 4 (four) times daily. (Patient not taking: Reported on 07/22/2022)   ondansetron (ZOFRAN ODT) 4 MG disintegrating tablet Take 1 tablet (4 mg total) by mouth every 8 (eight) hours as needed for nausea or vomiting. (Patient not taking: Reported on 09/16/2021)   pimecrolimus (ELIDEL) 1 % cream Apply to psoriasis on face/body 1-2 times daily. (Patient not taking: Reported on 02/22/2022)   tretinoin (RETIN-A) 0.025 % cream Apply a pea-sized amount to face every night as tolerated for acne. (Patient not taking: Reported on 10/18/2022)   [DISCONTINUED] famotidine (PEPCID) 20 MG tablet Take 1 tablet (20 mg total) by mouth 2 (two) times daily.   Facility-Administered Encounter Medications as of 12/30/2022  Medication   bupivacaine (MARCAINE) 0.5 % 15 mL, phenazopyridine (PYRIDIUM) 400 mg bladder mixture    Allergies (verified) Acyclovir and related, Bee pollen, Citric acid, Dust mite extract, Grapeseed extract [nutritional supplements], Pineapple, Pollen extract, and Nickel   History: Past Medical History:  Diagnosis Date   Allergic asthma    Allergy    SEASONAL   Anxiety    Breast mass 05/2017   lt breast lump   Eczema    Fibroid     GERD (gastroesophageal reflux disease)    History of anal fissures    History of Bell's palsy    2006  RIGHT SIDE--  RESOLVED   History of gastroesophageal reflux (GERD)    History of kidney stones    Hyperlipidemia    Interstitial cystitis    Psoriasis    Sarcoidosis of lung ([place])    DX 2006--  PULMOLOGIST--  DR ZDGU- on 2 liters of oxygen, goes to pain clinic   Seasonal allergic rhinitis    Shortness of breath    Vaginal Pap smear, abnormal    Past Surgical History:  Procedure Laterality Date   CYSTO WITH HYDRODISTENSION N/A 08/24/2013   Procedure: CYSTOSCOPY/HYDRODISTENSION with instillation of marcaine and pyridium;  Surgeon: Fredricka Bonine, MD;  Location: [facility];  Service: Urology;  Laterality: N/A;   CYSTOSCOPY/ HYDRODISTENTION/ BLADDER BX  06-09-2010   CYSTOSCOPY/URETEROSCOPY/HOLMIUM LASER/STENT PLACEMENT Right 01/30/2019   Procedure: CYSTOSCOPY/RETROGRADE/URETEROSCOPY/HOLMIUM LASER/STENT PLACEMENT;  Surgeon: Festus Aloe, MD;  Location: [facility];  Service: Urology;  Laterality: Right;   DILATION AND CURETTAGE OF UTERUS  1997   EXTRACORPOREAL SHOCK WAVE LITHOTRIPSY  Right 11/30/2018   Procedure: EXTRACORPOREAL SHOCK WAVE LITHOTRIPSY (ESWL);  Surgeon: Bjorn Loser, MD;  Location: [facility];  Service: Urology;  Laterality: Right;   IR TRANSCATHETER BX  06/14/2018   IR US GUIDE VASC ACCESS RIGHT  06/14/2018   IR VENOGRAM HEPATIC W HEMODYNAMIC EVALUATION  06/14/2018   Family History  Problem Relation Age of Onset   Brain cancer Maternal Grandmother    Hyperlipidemia Other    Sleep apnea Other    Depression Maternal Aunt    Seizures Maternal Uncle    Colon cancer Neg Hx    Stomach cancer Neg Hx    Social History   Socioeconomic History   Marital status: Single    Spouse name: Not on file   Number of children: 0   Years of education: Not on file   Highest education level: Some college, no degree  Occupational History   Occupation: disabled   Tobacco Use   Smoking status: Some Days    Packs/day: 0.30    Years: 6.00    Total pack years: 1.80    Types: Cigars, Cigarettes    Last attempt to quit: 11/2016    Years since quitting: 6.0   Smokeless tobacco: Never   Tobacco comments:    "Smokes 1 black and mild occasionally"  Vaping Use   Vaping Use: Never used  Substance and Sexual Activity   Alcohol use: No    Alcohol/week: 0.0 standard drinks of alcohol   Drug use: No    Comment: HX MARIJUANA USE   Sexual activity: Not Currently    Birth control/protection: None    Comment: abstinence   Other Topics Concern   Not on file  Social History Narrative   Lives alone.   Social Determinants of Health   Financial Resource Strain: Low Risk  (12/30/2022)   Overall Financial Resource Strain (CARDIA)    Difficulty of Paying Living Expenses: Not hard at all  Food Insecurity: No Food Insecurity (12/30/2022)   Hunger Vital Sign    Worried About Running Out of Food in the Last Year: Never true    Ran Out of Food in the Last Year: Never true  Transportation Needs: No Transportation Needs (12/30/2022)   PRAPARE - Hydrologist (Medical): No    Lack of Transportation (Non-Medical): No  Physical Activity: Inactive (12/30/2022)   Exercise Vital Sign    Days of Exercise per Week: 0 days    Minutes of Exercise per Session: 0 min  Stress: Stress Concern Present (12/30/2022)   [place]    Feeling of Stress : To some extent  Social Connections: Socially Isolated (09/16/2021)   Social Connection and Isolation Panel [NHANES]    Frequency of Communication with Friends and Family: Once a week    Frequency of Social Gatherings with Friends and Family: Once a week    Attends Religious Services: Never    Marine scientist or Organizations: No    Attends Music therapist: Never    Marital Status: Never married    Tobacco  Counseling Ready to quit: Not Answered Counseling given: Not Answered Tobacco comments: "Smokes 1 black and mild occasionally"   Clinical Intake:  Pre-visit preparation completed: Yes  Pain : 0-10 Pain Score: 8  Pain Type: Chronic pain Pain Location: Back (both knees) Pain Descriptors / Indicators: Aching Pain Onset: More than a month ago Pain Frequency: Constant     Nutritional  Status: BMI > 30  Obese Nutritional Risks: None Diabetes: No  How often do you need to have someone help you when you read instructions, pamphlets, or other written materials from your doctor or pharmacy?: 1 - Never  Diabetic? no  Interpreter Needed?: No  Information entered by :: NAllen LPN   Activities of Daily Living    12/30/2022    4:11 PM  In your present state of health, do you have any difficulty performing the following activities:  Hearing? 0  Vision? 1  Comment blurry  Difficulty concentrating or making decisions? 0  Walking or climbing stairs? 1  Dressing or bathing? 1  Doing errands, shopping? 0  Preparing Food and eating ? N  Using the Toilet? N  In the past six months, have you accidently leaked urine? N  Do you have problems with loss of bowel control? N  Managing your Medications? N  Managing your Finances? N  Housekeeping or managing your Housekeeping? N    Patient Care Team: Fenton Foy, NP as PCP - General (Pulmonary Disease) Arvella Nigh, MD as Consulting Physician (Chiropractic Medicine)  Indicate any recent Medical Services you may have received from other than [facility] providers in the past year (date may be approximate).     Assessment:   This is a routine wellness examination for P[place].  Hearing/Vision screen Vision Screening - Comments:: Regular eye exams, [facility]  Dietary issues and exercise activities discussed: Current Exercise Habits: The patient does not participate in regular exercise at present   Goals Addressed              This Visit's Progress    Patient Stated       12/30/2022, wants to work on going up the stairs easier       Depression Screen    12/30/2022    4:05 PM 10/18/2022    1:09 PM 07/22/2022   11:19 AM 09/16/2021    3:15 PM 05/04/2021   10:39 AM 05/05/2020   10:23 AM 04/23/2020    9:37 AM  PHQ 2/9 Scores  PHQ - 2 Score 4 0 0  0 0 2  PHQ- 9 Score 10  3  0  8  Exception Documentation    Other- indicate reason in comment box  Patient refusal   Not completed    Pt just states she wouldnt know it if she was and says she prefers to stay to herself.       Fall Risk    12/30/2022    4:04 PM 10/18/2022    1:08 PM 07/22/2022   11:19 AM 09/16/2021    3:17 PM 06/12/2021    2:51 PM  [place] in the past year? 0 0 0 0 0  Number falls in past yr: 0 0 0  0  Injury with Fall? 0 0 0  0  Risk for fall due to : Impaired balance/gait;Medication side effect;Impaired mobility No Fall Risks No Fall Risks Impaired mobility;Impaired balance/gait   Follow up Falls prevention discussed;Education provided;Falls evaluation completed  Falls evaluation completed Falls evaluation completed;Falls prevention discussed     FALL RISK PREVENTION PERTAINING TO THE HOME:  Any stairs in or around the home? Yes  If so, are there any without handrails? No  Home free of loose throw rugs in walkways, pet beds, electrical cords, etc? Yes  Adequate lighting in your home to reduce risk of falls? Yes   ASSISTIVE DEVICES UTILIZED TO PREVENT FALLS:  Life alert? No  Use of a cane, walker or w/c? Yes  Grab bars in the bathroom? No  Shower chair or bench in shower? No  Elevated toilet seat or a handicapped toilet? No   TIMED UP AND GO:  Was the test performed? No .       Cognitive Function:        12/30/2022    4:15 PM 09/16/2021    3:31 PM  6CIT Screen  What Year? 0 points 0 points  What month? 0 points 0 points  What time? 0 points 0 points  Count back from 20 0 points 0 points  Months in reverse 4 points 0  points  Repeat phrase 6 points 0 points  Total Score 10 points 0 points    Immunizations Immunization History  Administered Date(s) Administered   Dtap, Unspecified 03/25/1977, 06/15/1977, 12/02/1977, 04/05/1979, 05/14/1981   HPV 9-valent 11/15/2019   Influenza Whole 09/02/2011, 09/26/2012   Influenza,inj,Quad PF,6+ Mos 12/27/2012, 09/04/2015, 09/20/2017, 08/10/2018, 08/27/2019, 11/20/2019   Measles 04/29/1978   Mumps 04/29/1978   Pneumococcal Conjugate-13 11/14/2018   Pneumococcal Polysaccharide-23 05/10/2016   Rubella 04/29/1978   Tdap 05/10/2016    TDAP status: Up to date  Flu Vaccine status: Due, Education has been provided regarding the importance of this vaccine. Advised may receive this vaccine at local pharmacy or Health Dept. Aware to provide a copy of the vaccination record if obtained from local pharmacy or Health Dept. Verbalized acceptance and understanding.  Pneumococcal vaccine status: Up to date  Covid-19 vaccine status: Declined, Education has been provided regarding the importance of this vaccine but patient still declined. Advised may receive this vaccine at local pharmacy or Health Dept.or vaccine clinic. Aware to provide a copy of the vaccination record if obtained from local pharmacy or Health Dept. Verbalized acceptance and understanding.  Qualifies for Shingles Vaccine? No   Zostavax completed  n/a   Shingrix Completed?: n/a  Screening Tests Health Maintenance  Topic Date Due   COVID-19 Vaccine (1) Never done   HPV VACCINES (2 - Risk 3-dose SCDM series) 12/13/2019   Medicare Annual Wellness (AWV)  09/16/2022   INFLUENZA VACCINE  03/27/2023 (Originally 07/27/2022)   MAMMOGRAM  02/09/2023   COLONOSCOPY (Pts 45-50yr Insurance coverage will need to be confirmed)  09/22/2023   PAP SMEAR-Modifier  07/07/2024   DTaP/Tdap/Td (7 - Td or Tdap) 05/10/2026   Hepatitis C Screening  Completed   HIV Screening  Completed    Health Maintenance  Health  Maintenance Due  Topic Date Due   COVID-19 Vaccine (1) Never done   HPV VACCINES (2 - Risk 3-dose SCDM series) 12/13/2019   Medicare Annual Wellness (AWV)  09/16/2022    Colorectal cancer screening: Type of screening: Colonoscopy. Completed 09/21/2018. Repeat every 5 years  Mammogram status: Completed 02/09/2022. Repeat every year  Bone Density status: n/a  Lung Cancer Screening: (Low Dose CT Chest recommended if Age 556-80years, 30 pack-year currently smoking OR have quit w/in 15years.) does not qualify.   Lung Cancer Screening Referral: no  Additional Screening:  Hepatitis C Screening: does qualify; Completed 04/25/2018  Vision Screening: Recommended annual ophthalmology exams for early detection of glaucoma and other disorders of the eye. Is the patient up to date with their annual eye exam?  Yes  Who is the provider or what is the name of the office in which the patient attends annual eye exams? [facility] If pt is not established with a provider, would they like to  be referred to a provider to establish care? No .   Dental Screening: Recommended annual dental exams for proper oral hygiene  Community Resource Referral / Chronic Care Management: CRR required this visit?  No   CCM required this visit?  No      Plan:     I have personally reviewed and noted the following in the patient's chart:   Medical and social history Use of alcohol, tobacco or illicit drugs  Current medications and supplements including opioid prescriptions. Patient is currently taking opioid prescriptions. Information provided to patient regarding non-opioid alternatives. Patient advised to discuss non-opioid treatment plan with their provider. Functional ability and status Nutritional status Physical activity Advanced directives List of other physicians Hospitalizations, surgeries, and ER visits in previous 12 months Vitals Screenings to include cognitive, depression, and falls Referrals  and appointments  In addition, I have reviewed and discussed with patient certain preventive protocols, quality metrics, and best practice recommendations. A written personalized care plan for preventive services as well as general preventive health recommendations were provided to patient.     Kellie Simmering, LPN   06/01/4402   Nurse Notes: Patient has some complaints of unresolved itching all over, has been seeing blood in stool (has hx of hemorrhoids), area on scalp, numbness in legs when getting up,  needing to use crutches  Due to this being a virtual visit, the after visit summary with patients personalized plan was offered to patient via mail or my-chart.  Patient would like to access on my-chart

## 2023-01-03 ENCOUNTER — Ambulatory Visit: Payer: 59 | Admitting: Dermatology

## 2023-01-14 ENCOUNTER — Ambulatory Visit (HOSPITAL_COMMUNITY)
Admission: EM | Admit: 2023-01-14 | Discharge: 2023-01-14 | Disposition: A | Discharge status: Home / Self Care | Payer: 59 | Attending: Family Medicine | Admitting: Family Medicine

## 2023-01-14 ENCOUNTER — Ambulatory Visit (INDEPENDENT_AMBULATORY_CARE_PROVIDER_SITE_OTHER): Payer: 59

## 2023-01-14 ENCOUNTER — Encounter (HOSPITAL_COMMUNITY): Payer: Self-pay | Admitting: Emergency Medicine

## 2023-01-14 DIAGNOSIS — R0789 Other chest pain: Secondary | ICD-10-CM

## 2023-01-14 MED ORDER — KETOROLAC TROMETHAMINE 30 MG/ML IJ SOLN
30.0000 mg | Freq: Once | INTRAMUSCULAR | Status: AC
  Administered 2023-01-14: 30 mg via INTRAMUSCULAR

## 2023-01-14 MED ORDER — KETOROLAC TROMETHAMINE 30 MG/ML IJ SOLN
INTRAMUSCULAR | Status: AC
  Filled 2023-01-14: qty 1

## 2023-01-14 MED ORDER — IBUPROFEN 800 MG PO TABS: 800.0000 mg | ORAL_TABLET | Freq: Three times a day (TID) | ORAL | 0 refills | Status: DC | PRN

## 2023-01-14 NOTE — Discharge Instructions (Signed)
Your EKG did not show any change from previous.  Your chest x-ray also was stable and did not show any new changes from your prior x-rays  You have been given a shot of Toradol 30 mg today.  Take ibuprofen 800 mg--1 tab every 8 hours as needed for pain.  You can try using heating pad on the sore area.  Keep your follow-up with your sarcoidosis doctors

## 2023-01-14 NOTE — ED Triage Notes (Signed)
Chest pain intermittent over the last week. States they're about to "pull the plug" on her friend in the ICU, and on leaving the hospital from visiting them she began to feel worsening chest pain today. States she feels more fatigued when walking long distances, and has to work harder to catch her breath or sit down. Hx of sarcoidosis.

## 2023-01-14 NOTE — ED Provider Notes (Signed)
[place]    CSN: [id number] Arrival date & time: 01/14/23  1504      History   Chief Complaint Chief Complaint  Patient presents with   Chest Pain    HPI Rhonda Hester is a 47 y.o. female.    Chest Pain  Here for sharp anterior chest pain that is near her upper left sternal border.  Is been going on for about a week.  It will come on and last for at least a few minutes and then went away.  Today it started hurting her more when she was walking across a parking lot from seeing her friend in the hospital.  No recent fever.  No change in her cough that is chronic. She has a history of sarcoidosis and states that she always has some dyspnea on exertion, but that has not increased lately.  She states her friend is in the ICU and they are considering whether or not to stop ventilator support.  This patient does have follow-up and will be having lung testing with [facility] in another month or so.   Past Medical History:  Diagnosis Date   Allergic asthma    Allergy    SEASONAL   Anxiety    Breast mass 05/2017   lt breast lump   Eczema    Fibroid    GERD (gastroesophageal reflux disease)    History of anal fissures    History of Bell's palsy    2006  RIGHT SIDE--  RESOLVED   History of gastroesophageal reflux (GERD)    History of kidney stones    Hyperlipidemia    Interstitial cystitis    Psoriasis    Sarcoidosis of lung ([place])    DX 2006--  PULMOLOGIST--  DR [facility]- on 2 liters of oxygen, goes to pain clinic   Seasonal allergic rhinitis    Shortness of breath    Vaginal Pap smear, abnormal     Patient Active Problem List   Diagnosis Date Noted   Bronchitis 10/18/2022   Rash 07/22/2022   ASCUS with positive high risk HPV cervical 08/13/2021   Moderate episode of recurrent major depressive disorder ([place]) 05/05/2020   Dysmenorrhea 04/23/2020   Dysplasia of cervix, low grade (CIN 1) 03/23/2020   HPV (human papilloma virus) infection 03/23/2020    Abnormal Pap smear of cervix 11/12/2019   Chronic back pain 10/03/2019   Eczema 10/03/2019   GERD (gastroesophageal reflux disease) 10/03/2019   Hyperlipidemia, unspecified 10/03/2019   Iron deficiency anemia, unspecified 10/03/2019   Mild intermittent asthma without complication 78/46/9629   Obesity (BMI 30-39.9) 10/03/2019   Tobacco user 10/03/2019   Chronic pain syndrome 10/03/2019   Sarcoidosis of lung ([place]) 08/22/2018   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   Enlarged submental lymph node 03/30/2018   Nodule of soft tissue 03/22/2018   Hemorrhoids 02/13/2018   TMJ (temporomandibular joint disorder) 12/23/2017   Acute pain of right shoulder 11/26/2017   Chronic bilateral low back pain without sciatica 08/01/2017   Chronic pain of right knee 08/01/2017   Depression 04/02/2017   Pelvic pain 03/28/2017   Obesity (BMI 30.0-34.9) 03/19/2017   Generalized pain 52/84/1324   Metabolic syndrome 40/09/2724   Medication management 03/19/2017   Tobacco abuse 01/24/2017   Uterine fibroid 01/17/2017   Chronic interstitial cystitis 01/17/2017   Menorrhagia 12/06/2016   Acne vulgaris 05/10/2016   BMI 31.0-31.9,adult 05/10/2016   Weight gain 05/10/2016   Right hip pain 10/27/2014   Cervical adenopathy  03/12/2014   History of smoking 08/07/2013   Allergic asthma 07/22/2011    Past Surgical History:  Procedure Laterality Date   CYSTO WITH HYDRODISTENSION N/A 08/24/2013   Procedure: CYSTOSCOPY/HYDRODISTENSION with instillation of marcaine and pyridium;  Surgeon: Fredricka Bonine, MD;  Location: [facility];  Service: Urology;  Laterality: N/A;   CYSTOSCOPY/ HYDRODISTENTION/ BLADDER BX  06-09-2010   CYSTOSCOPY/URETEROSCOPY/HOLMIUM LASER/STENT PLACEMENT Right 01/30/2019   Procedure: CYSTOSCOPY/RETROGRADE/URETEROSCOPY/HOLMIUM LASER/STENT PLACEMENT;  Surgeon: Festus Aloe, MD;  Location: [facility];  Service: Urology;  Laterality: Right;   DILATION AND  CURETTAGE OF UTERUS  1997   EXTRACORPOREAL SHOCK WAVE LITHOTRIPSY Right 11/30/2018   Procedure: EXTRACORPOREAL SHOCK WAVE LITHOTRIPSY (ESWL);  Surgeon: Bjorn Loser, MD;  Location: [facility];  Service: Urology;  Laterality: Right;   IR TRANSCATHETER BX  06/14/2018   IR US GUIDE VASC ACCESS RIGHT  06/14/2018   IR VENOGRAM HEPATIC W HEMODYNAMIC EVALUATION  06/14/2018    OB History     Gravida  1   Para  0   Term  0   Preterm  0   AB  1   Living  0      SAB  1   IAB  0   Ectopic  0   Multiple  0   Live Births               Home Medications    Prior to Admission medications   Medication Sig Start Date End Date Taking? Authorizing Provider  ibuprofen (ADVIL) 800 MG tablet Take 1 tablet (800 mg total) by mouth every 8 (eight) hours as needed (pain). 01/14/23  Yes Esau Fridman, Gwenlyn Perking, MD  oxyCODONE (ROXICODONE) 5 MG immediate release tablet Take 1 tablet (5 mg total) by mouth every 6 (six) hours as needed for up to 6 doses for severe pain or breakthrough pain. 07/01/21  Yes Fondaw, Wylder S, PA  tiZANidine (ZANAFLEX) 4 MG tablet Take 4 mg by mouth 3 (three) times daily. 06/10/20  Yes [provider]  Adalimumab 40 MG/0.8ML PNKT Inject into the skin. 04/06/22   [provider]  albuterol (PROAIR HFA) 108 (90 Base) MCG/ACT inhaler Inhale 2 puffs into the lungs every 6 (six) hours as needed for shortness of breath. 05/10/18   Dorena Dew, FNP  ammonium lactate (LAC-HYDRIN) 12 % cream Apply to feet once or twice daily Patient not taking: Reported on 10/18/2022 09/29/22   Brendolyn Patty, MD  calcipotriene-betamethasone [facility]) ointment Apply to body qhs for psoriasis Patient not taking: Reported on 07/22/2022 09/25/21   Brendolyn Patty, MD  clindamycin (CLEOCIN-T) 1 % lotion Apply in morning to face for acne Patient not taking: Reported on 07/22/2022 09/15/21   Brendolyn Patty, MD  clindamycin-benzoyl peroxide [facility]) gel Apply to face every morning for acne.  Risk bleaching. Patient not taking: Reported on 10/18/2022 09/29/22   Brendolyn Patty, MD  clobetasol cream (TEMOVATE) 0.05 % Apply to more severe areas psoriasis once to twice daily until improved. Avoid face, groin, underarms. Patient not taking: Reported on 10/18/2022 09/29/22   Brendolyn Patty, MD  clotrimazole-betamethasone (LOTRISONE) cream Apply 1 application topically 2 (two) times daily. Patient not taking: Reported on 02/22/2022 03/12/21   Vevelyn Francois, NP  clotrimazole-betamethasone (LOTRISONE) cream Apply 1 Application topically daily. Patient not taking: Reported on 10/18/2022 07/22/22   Fenton Foy, NP  cyclobenzaprine (FLEXERIL) 10 MG tablet Take 1 tablet (10 mg total) by mouth 2 (two) times daily as needed for muscle spasms. 07/19/21  Gareth Morgan, MD  DULERA 200-5 MCG/ACT AERO Inhale 2 puffs into the lungs 2 (two) times daily. 03/29/20   [provider]  DULoxetine (CYMBALTA) 30 MG capsule Take 30 mg by mouth 2 (two) times daily. Patient not taking: Reported on 12/30/2022 04/15/22   [provider]  Fluocinolone Acetonide Body 0.01 % OIL Apply topically as needed Patient not taking: Reported on 10/18/2022 09/15/22   Brendolyn Patty, MD  folic acid (FOLVITE) 1 MG tablet Take 1 mg by mouth daily. Patient not taking: Reported on 07/22/2022 04/06/22   [provider]  ketoconazole (NIZORAL) 2 % shampoo Apply 1 Application topically 2 (two) times a week. Patient not taking: Reported on 10/18/2022 09/16/22   Brendolyn Patty, MD  neomycin-polymyxin-hydrocortisone (CORTISPORIN) OTIC solution Place 3 drops into both ears 4 (four) times daily. Patient not taking: Reported on 07/22/2022 06/22/21   Vevelyn Francois, NP  ondansetron (ZOFRAN ODT) 4 MG disintegrating tablet Take 1 tablet (4 mg total) by mouth every 8 (eight) hours as needed for nausea or vomiting. Patient not taking: Reported on 09/16/2021 07/01/21   Tedd Sias, PA  pimecrolimus (ELIDEL) 1 % cream Apply to  psoriasis on face/body 1-2 times daily. Patient not taking: Reported on 02/22/2022 01/18/22   Brendolyn Patty, MD  tretinoin (RETIN-A) 0.025 % cream Apply a pea-sized amount to face every night as tolerated for acne. Patient not taking: Reported on 10/18/2022 09/29/22   Brendolyn Patty, MD  famotidine (PEPCID) 20 MG tablet Take 1 tablet (20 mg total) by mouth 2 (two) times daily. 05/24/19 12/01/20  Lanae Boast, FNP    Family History Family History  Problem Relation Age of Onset   Brain cancer Maternal Grandmother    Hyperlipidemia Other    Sleep apnea Other    Depression Maternal Aunt    Seizures Maternal Uncle    Colon cancer Neg Hx    Stomach cancer Neg Hx     Social History Social History   Tobacco Use   Smoking status: Some Days    Packs/day: 0.30    Years: 6.00    Total pack years: 1.80    Types: Cigars, Cigarettes    Last attempt to quit: 11/2016    Years since quitting: 6.1   Smokeless tobacco: Never   Tobacco comments:    "Smokes 1 black and mild occasionally"  Vaping Use   Vaping Use: Never used  Substance Use Topics   Alcohol use: No    Alcohol/week: 0.0 standard drinks of alcohol   Drug use: No    Comment: HX MARIJUANA USE     Allergies   Acyclovir and related, Bee pollen, Citric acid, Dust mite extract, Grapeseed extract [nutritional supplements], Pineapple, Pollen extract, and Nickel   Review of Systems Review of Systems  Cardiovascular:  Positive for chest pain.     Physical Exam Triage Vital Signs ED Triage Vitals  Enc Vitals Group     BP 01/14/23 1517 (!) 145/95     Pulse Rate 01/14/23 1517 91     Resp 01/14/23 1517 16     Temp 01/14/23 1517 98.5 F (36.9 C)     Temp Source 01/14/23 1517 Oral     SpO2 01/14/23 1517 95 %     Weight --      Height --      Head Circumference --      Peak Flow --      Pain Score 01/14/23 1520 7     Pain Loc --  Pain Edu? --      Excl. in [place]? --    No data found.  Updated Vital Signs BP (!) 145/95  (BP Location: Right Arm)   Pulse 91   Temp 98.5 F (36.9 C) (Oral)   Resp 16   SpO2 95%   Visual Acuity Right Eye Distance:   Left Eye Distance:   Bilateral Distance:    Right Eye Near:   Left Eye Near:    Bilateral Near:     Physical Exam Vitals reviewed.  Constitutional:      General: She is not in acute distress.    Appearance: She is not ill-appearing, toxic-appearing or diaphoretic.  HENT:     Right Ear: Tympanic membrane and ear canal normal.     Left Ear: Tympanic membrane and ear canal normal.     Nose: Nose normal.     Mouth/Throat:     Mouth: Mucous membranes are moist.     Pharynx: No oropharyngeal exudate or posterior oropharyngeal erythema.  Eyes:     Extraocular Movements: Extraocular movements intact.     Conjunctiva/sclera: Conjunctivae normal.     Pupils: Pupils are equal, round, and reactive to light.  Cardiovascular:     Rate and Rhythm: Normal rate and regular rhythm.     Heart sounds: No murmur heard. Pulmonary:     Effort: Pulmonary effort is normal. No respiratory distress.     Breath sounds: No stridor. No wheezing, rhonchi or rales.     Comments: There is a little point tenderness to the left of her upper sternal border  Musculoskeletal:     Cervical back: Neck supple.  Lymphadenopathy:     Cervical: No cervical adenopathy.  Skin:    Capillary Refill: Capillary refill takes less than 2 seconds.     Coloration: Skin is not jaundiced or pale.  Neurological:     General: No focal deficit present.     Mental Status: She is alert and oriented to person, place, and time.  Psychiatric:        Behavior: Behavior normal.      UC Treatments / Results  Labs (all labs ordered are listed, but only abnormal results are displayed) Labs Reviewed - No data to display  EKG   Radiology DG Chest 2 View  Result Date: 01/14/2023 CLINICAL DATA:  Sharp left anterior chest pain. History of sarcoidosis. EXAM: CHEST - 2 VIEW COMPARISON:  09/22/2021  FINDINGS: Heart size and pulmonary vascularity are normal. Focal areas of linear fibrosis in the right upper lung, right lower lung, and left mid lung similar to prior study. This may be related to patient's history of sarcoidosis. No airspace disease or consolidation. No pleural effusions. No pneumothorax. Mediastinal contours appear intact. IMPRESSION: Scattered pulmonary fibrosis without change. No evidence of active pulmonary disease. Electronically Signed   By: Lucienne Capers M.D.   On: 01/14/2023 16:09    Procedures Procedures (including critical care time)  Medications Ordered in UC Medications  ketorolac (TORADOL) 30 MG/ML injection 30 mg (has no administration in time range)    Initial Impression / Assessment and Plan / UC Course  I have reviewed the triage vital signs and the nursing notes.  Pertinent labs & imaging results that were available during my care of the patient were reviewed by me and considered in my medical decision making (see chart for details).      EKG is unchanged from previous.  I reviewed the EKG from 2022 She shows me  on her phone a prior x-ray that shows some hilar adenopathy Chest x-ray is read as stable from previous.  I discussed with her that I think she has some chest wall pain most likely though stress may be playing a factor.  She is given a shot of Toradol here and ibuprofen for a few days is sent into the pharmacy.  She does have pulmonary follow-up  She states in the past steroids caused visual changes, so that is why I am not treating with prednisone today. Final Clinical Impressions(s) / UC Diagnoses   Final diagnoses:  Chest wall pain     Discharge Instructions      Your EKG did not show any change from previous.  Your chest x-ray also was stable and did not show any new changes from your prior x-rays  You have been given a shot of Toradol 30 mg today.  Take ibuprofen 800 mg--1 tab every 8 hours as needed for pain.  You can try  using heating pad on the sore area.  Keep your follow-up with your sarcoidosis doctors     ED Prescriptions     Medication Sig Dispense Auth. Provider   ibuprofen (ADVIL) 800 MG tablet Take 1 tablet (800 mg total) by mouth every 8 (eight) hours as needed (pain). 21 tablet Mikolaj Woolstenhulme, Gwenlyn Perking, MD      PDMP not reviewed this encounter.   Barrett Henle, MD 01/14/23 [id number]

## 2023-01-19 ENCOUNTER — Ambulatory Visit: Payer: 59 | Admitting: Dermatology

## 2023-01-20 ENCOUNTER — Encounter: Payer: Self-pay | Admitting: Nurse Practitioner

## 2023-01-20 ENCOUNTER — Ambulatory Visit (INDEPENDENT_AMBULATORY_CARE_PROVIDER_SITE_OTHER): Payer: 59 | Admitting: Nurse Practitioner

## 2023-01-20 VITALS — BP 134/85 | HR 87 | Temp 97.7°F | Ht 67.5 in | Wt 230.0 lb

## 2023-01-20 DIAGNOSIS — R079 Chest pain, unspecified: Secondary | ICD-10-CM

## 2023-01-20 DIAGNOSIS — Z13220 Encounter for screening for lipoid disorders: Secondary | ICD-10-CM | POA: Diagnosis not present

## 2023-01-20 DIAGNOSIS — N644 Mastodynia: Secondary | ICD-10-CM

## 2023-01-20 DIAGNOSIS — R21 Rash and other nonspecific skin eruption: Secondary | ICD-10-CM

## 2023-01-20 NOTE — Patient Instructions (Signed)
1. Intermittent chest pain  - Ambulatory referral to Cardiology - CBC - Comprehensive metabolic panel  2. Lipid screening  - Lipid Panel  3. Rash  - Ambulatory referral to Dermatology  4. Breast pain, left  - MM Digital Diagnostic Bilat; Future  Follow up:  Follow up in 6 months or sooner if needed

## 2023-01-20 NOTE — Progress Notes (Signed)
$'@Patient'k$  ID: Rhonda Hester, female    DOB: [birth year], 47 y.o.   MRN: [id number]  Chief Complaint  Patient presents with   Follow-up    Pain in left breast and chest area. Needs an order for mammogram. No discharge, discoloration. Also asking for new referral to derm-dosnt like current.     Referring provider: Fenton Foy, NP   HPI  Patient presents today for a follow-up visit.  She states that she is currently having left breast pain that radiates into the sternum.  She was recently seen in the ED for chest pain and overall exam was negative.  She was diagnosed with chest wall pain.  We will order diagnostic mammogram.  We will refer patient to cardiology due to ongoing intermittent chest pain.  Patient is also requesting a second opinion to a different dermatologist regarding ongoing skin rash. Denies f/c/s, n/v/d, hemoptysis, PND, leg swelling Denies chest pain or edema      Allergies  Allergen Reactions   Acyclovir And Related Other (See Comments)    Patient states it "made me feel like I can't breath"    Bee Pollen Itching and Other (See Comments)     coughing   Citric Acid Other (See Comments)    AVOIDS DUE TO INTERSTITIAL CYSTITIS   Dust Mite Extract Itching and Other (See Comments)     coughing   Grapeseed Extract [Nutritional Supplements] Other (See Comments)    Throat itching and burning   Pineapple Itching    Burning in mouth and tingling lips   Pollen Extract Itching and Other (See Comments)     coughing   Nickel Rash    Immunization History  Administered Date(s) Administered   Dtap, Unspecified 03/25/1977, 06/15/1977, 12/02/1977, 04/05/1979, 05/14/1981   HPV 9-valent 11/15/2019   Influenza Whole 09/02/2011, 09/26/2012   Influenza,inj,Quad PF,6+ Mos 12/27/2012, 09/04/2015, 09/20/2017, 08/10/2018, 08/27/2019, 11/20/2019   Influenza-Unspecified 12/27/2012, 09/04/2015, 09/20/2017, 08/10/2018   Measles 04/29/1978   Mumps 04/29/1978   Pneumococcal  Conjugate-13 11/14/2018   Pneumococcal Polysaccharide-23 05/10/2016   Polio, Unspecified 03/25/1977, 06/15/1977, 12/02/1977, 04/29/1978, 04/05/1979, 05/14/1981   Rubella 04/29/1978   Tdap 05/10/2016    Past Medical History:  Diagnosis Date   Allergic asthma    Allergy    SEASONAL   Anxiety    Breast mass 05/2017   lt breast lump   Eczema    Fibroid    GERD (gastroesophageal reflux disease)    History of anal fissures    History of Bell's palsy    2006  RIGHT SIDE--  RESOLVED   History of gastroesophageal reflux (GERD)    History of kidney stones    Hyperlipidemia    Interstitial cystitis    Psoriasis    Sarcoidosis of lung ([place])    DX 2006--  PULMOLOGIST--  DR [facility]- on 2 liters of oxygen, goes to pain clinic   Seasonal allergic rhinitis    Shortness of breath    Vaginal Pap smear, abnormal     Tobacco History: Social History   Tobacco Use  Smoking Status Some Days   Packs/day: 0.30   Years: 6.00   Total pack years: 1.80   Types: Cigars, Cigarettes   Last attempt to quit: 11/2016   Years since quitting: 6.1  Smokeless Tobacco Never  Tobacco Comments   "Smokes 1 black and mild occasionally"   Ready to quit: Not Answered Counseling given: Not Answered Tobacco comments: "Smokes 1 black and mild occasionally"   Outpatient Encounter Medications  as of 01/20/2023  Medication Sig   Adalimumab 40 MG/0.8ML PNKT Inject into the skin.   albuterol (PROAIR HFA) 108 (90 Base) MCG/ACT inhaler Inhale 2 puffs into the lungs every 6 (six) hours as needed for shortness of breath.   DULERA 200-5 MCG/ACT AERO Inhale 2 puffs into the lungs 2 (two) times daily.   DULoxetine (CYMBALTA) 30 MG capsule Take 30 mg by mouth 2 (two) times daily.   folic acid (FOLVITE) 1 MG tablet Take 1 mg by mouth daily.   ibuprofen (ADVIL) 800 MG tablet Take 1 tablet (800 mg total) by mouth every 8 (eight) hours as needed (pain).   ondansetron (ZOFRAN ODT) 4 MG disintegrating tablet Take 1 tablet (4 mg  total) by mouth every 8 (eight) hours as needed for nausea or vomiting.   oxyCODONE (ROXICODONE) 5 MG immediate release tablet Take 1 tablet (5 mg total) by mouth every 6 (six) hours as needed for up to 6 doses for severe pain or breakthrough pain.   tiZANidine (ZANAFLEX) 4 MG tablet Take 4 mg by mouth 3 (three) times daily.   ammonium lactate (LAC-HYDRIN) 12 % cream Apply to feet once or twice daily (Patient not taking: Reported on 01/20/2023)   calcipotriene-betamethasone (TACLONEX) ointment Apply to body qhs for psoriasis (Patient not taking: Reported on 01/20/2023)   clindamycin (CLEOCIN-T) 1 % lotion Apply in morning to face for acne (Patient not taking: Reported on 01/20/2023)   clindamycin-benzoyl peroxide (BENZACLIN) gel Apply to face every morning for acne. Risk bleaching. (Patient not taking: Reported on 01/20/2023)   clobetasol cream (TEMOVATE) 0.05 % Apply to more severe areas psoriasis once to twice daily until improved. Avoid face, groin, underarms. (Patient not taking: Reported on 01/20/2023)   clotrimazole-betamethasone (LOTRISONE) cream Apply 1 application topically 2 (two) times daily. (Patient not taking: Reported on 01/20/2023)   clotrimazole-betamethasone (LOTRISONE) cream Apply 1 Application topically daily. (Patient not taking: Reported on 01/20/2023)   cyclobenzaprine (FLEXERIL) 10 MG tablet Take 1 tablet (10 mg total) by mouth 2 (two) times daily as needed for muscle spasms. (Patient not taking: Reported on 01/20/2023)   Fluocinolone Acetonide Body 0.01 % OIL Apply topically as needed (Patient not taking: Reported on 01/20/2023)   ketoconazole (NIZORAL) 2 % shampoo Apply 1 Application topically 2 (two) times a week. (Patient not taking: Reported on 01/20/2023)   neomycin-polymyxin-hydrocortisone (CORTISPORIN) OTIC solution Place 3 drops into both ears 4 (four) times daily. (Patient not taking: Reported on 01/20/2023)   pimecrolimus (ELIDEL) 1 % cream Apply to psoriasis on face/body 1-2  times daily. (Patient not taking: Reported on 01/20/2023)   tretinoin (RETIN-A) 0.025 % cream Apply a pea-sized amount to face every night as tolerated for acne. (Patient not taking: Reported on 01/20/2023)   [DISCONTINUED] famotidine (PEPCID) 20 MG tablet Take 1 tablet (20 mg total) by mouth 2 (two) times daily.   Facility-Administered Encounter Medications as of 01/20/2023  Medication   bupivacaine (MARCAINE) 0.5 % 15 mL, phenazopyridine (PYRIDIUM) 400 mg bladder mixture     Review of Systems  Review of Systems  Constitutional: Negative.   HENT: Negative.    Cardiovascular: Negative.   Gastrointestinal: Negative.   Allergic/Immunologic: Negative.   Neurological: Negative.   Psychiatric/Behavioral: Negative.         Physical Exam  BP 134/85   Pulse 87   Temp 97.7 F (36.5 C) (Temporal)   Ht 5' 7.5" (1.715 m)   Wt 230 lb (104.3 kg)   SpO2 99%   BMI 35.49  kg/m   Wt Readings from Last 5 Encounters:  01/20/23 230 lb (104.3 kg)  12/30/22 224 lb (101.6 kg)  10/18/22 228 lb 9.6 oz (103.7 kg)  07/22/22 228 lb 9.6 oz (103.7 kg)  04/21/22 233 lb 3.2 oz (105.8 kg)     Physical Exam Vitals and nursing note reviewed.  Constitutional:      General: She is not in acute distress.    Appearance: She is well-developed.  Cardiovascular:     Rate and Rhythm: Normal rate and regular rhythm.  Pulmonary:     Effort: Pulmonary effort is normal.     Breath sounds: Normal breath sounds.  Neurological:     Mental Status: She is alert and oriented to person, place, and time.      Assessment & Plan:   Intermittent chest pain - Ambulatory referral to Cardiology - CBC - Comprehensive metabolic panel  2. Lipid screening  - Lipid Panel  3. Rash  - Ambulatory referral to Dermatology  4. Breast pain, left  - MM Digital Diagnostic Bilat; Future  Follow up:  Follow up in 6 months or sooner if needed     Fenton Foy, NP 01/20/2023

## 2023-01-20 NOTE — Assessment & Plan Note (Signed)
-  Ambulatory referral to Cardiology - CBC - Comprehensive metabolic panel  2. Lipid screening  - Lipid Panel  3. Rash  - Ambulatory referral to Dermatology  4. Breast pain, left  - MM Digital Diagnostic Bilat; Future  Follow up:  Follow up in 6 months or sooner if needed

## 2023-01-21 LAB — COMPREHENSIVE METABOLIC PANEL
ALT: 32 IU/L (ref 0–32)
AST: 26 IU/L (ref 0–40)
Albumin/Globulin Ratio: 1.4 (ref 1.2–2.2)
Albumin: 3.8 g/dL — ABNORMAL LOW (ref 3.9–4.9)
Alkaline Phosphatase: 80 IU/L (ref 44–121)
BUN/Creatinine Ratio: 13 (ref 9–23)
BUN: 9 mg/dL (ref 6–24)
Bilirubin Total: 0.4 mg/dL (ref 0.0–1.2)
CO2: 19 mmol/L — ABNORMAL LOW (ref 20–29)
Calcium: 9 mg/dL (ref 8.7–10.2)
Chloride: 105 mmol/L (ref 96–106)
Creatinine, Ser: 0.71 mg/dL (ref 0.57–1.00)
Globulin, Total: 2.8 g/dL (ref 1.5–4.5)
Glucose: 220 mg/dL — ABNORMAL HIGH (ref 70–99)
Potassium: 3.9 mmol/L (ref 3.5–5.2)
Sodium: 140 mmol/L (ref 134–144)
Total Protein: 6.6 g/dL (ref 6.0–8.5)
eGFR: 106 mL/min/{1.73_m2} (ref >59)

## 2023-01-21 LAB — CBC
Hematocrit: 40.5 % (ref 34.0–46.6)
Hemoglobin: 13.2 g/dL (ref 11.1–15.9)
MCH: 29.4 pg (ref 26.6–33.0)
MCHC: 32.6 g/dL (ref 31.5–35.7)
MCV: 90 fL (ref 79–97)
Platelets: 301 10*3/uL (ref 150–450)
RBC: 4.49 x10E6/uL (ref 3.77–5.28)
RDW: 12.4 % (ref 11.7–15.4)
WBC: 7.6 10*3/uL (ref 3.4–10.8)

## 2023-01-21 LAB — LIPID PANEL
Chol/HDL Ratio: 3.6 ratio (ref 0.0–4.4)
Cholesterol, Total: 178 mg/dL (ref 100–199)
HDL: 49 mg/dL (ref >39)
LDL Chol Calc (NIH): 101 mg/dL — ABNORMAL HIGH (ref 0–99)
Triglycerides: 158 mg/dL — ABNORMAL HIGH (ref 0–149)
VLDL Cholesterol Cal: 28 mg/dL (ref 5–40)

## 2023-01-31 ENCOUNTER — Other Ambulatory Visit: Payer: Self-pay | Admitting: Nurse Practitioner

## 2023-01-31 DIAGNOSIS — N644 Mastodynia: Secondary | ICD-10-CM

## 2023-02-07 ENCOUNTER — Encounter: Payer: Self-pay | Admitting: Nurse Practitioner

## 2023-02-08 ENCOUNTER — Ambulatory Visit: Admission: RE | Admit: 2023-02-08 | Payer: 59 | Source: Ambulatory Visit

## 2023-02-08 ENCOUNTER — Ambulatory Visit
Admission: RE | Admit: 2023-02-08 | Discharge: 2023-02-08 | Disposition: A | Discharge status: Home / Self Care | Payer: 59 | Source: Ambulatory Visit | Attending: Nurse Practitioner | Admitting: Nurse Practitioner

## 2023-02-08 ENCOUNTER — Other Ambulatory Visit: Payer: Self-pay | Admitting: Nurse Practitioner

## 2023-02-08 DIAGNOSIS — N644 Mastodynia: Secondary | ICD-10-CM

## 2023-02-08 DIAGNOSIS — Z13220 Encounter for screening for lipoid disorders: Secondary | ICD-10-CM

## 2023-02-08 DIAGNOSIS — R079 Chest pain, unspecified: Secondary | ICD-10-CM

## 2023-02-08 DIAGNOSIS — R21 Rash and other nonspecific skin eruption: Secondary | ICD-10-CM

## 2023-02-10 ENCOUNTER — Other Ambulatory Visit: Payer: Self-pay | Admitting: Nurse Practitioner

## 2023-02-10 ENCOUNTER — Telehealth: Payer: Self-pay | Admitting: Nurse Practitioner

## 2023-02-10 NOTE — Telephone Encounter (Signed)
Clarise Cruz from the [facility] [place] LVM on 02/10/23 requesting signed orders for this patient's diagnostic breast imaging for her appointment on Monday, 02/14/23.  Please call back at [phone]

## 2023-02-14 ENCOUNTER — Ambulatory Visit
Admission: RE | Admit: 2023-02-14 | Discharge: 2023-02-14 | Disposition: A | Discharge status: Home / Self Care | Payer: 59 | Source: Ambulatory Visit | Attending: Nurse Practitioner | Admitting: Nurse Practitioner

## 2023-02-14 ENCOUNTER — Ambulatory Visit: Payer: 59

## 2023-02-21 ENCOUNTER — Ambulatory Visit (INDEPENDENT_AMBULATORY_CARE_PROVIDER_SITE_OTHER): Payer: 59 | Admitting: Nurse Practitioner

## 2023-02-21 ENCOUNTER — Encounter: Payer: Self-pay | Admitting: Nurse Practitioner

## 2023-02-21 VITALS — BP 139/85 | HR 93 | Temp 97.2°F | Wt 236.0 lb

## 2023-02-21 DIAGNOSIS — R7303 Prediabetes: Secondary | ICD-10-CM | POA: Diagnosis not present

## 2023-02-21 DIAGNOSIS — R7301 Impaired fasting glucose: Secondary | ICD-10-CM | POA: Diagnosis not present

## 2023-02-21 LAB — POCT GLYCOSYLATED HEMOGLOBIN (HGB A1C): Hemoglobin A1C: 6.7 % — AB (ref 4.0–5.6)

## 2023-02-21 NOTE — Patient Instructions (Addendum)
1. Impaired fasting blood sugar  - POCT glycosylated hemoglobin (Hb A1C) - Ambulatory referral to diabetic education  Lab Results  Component Value Date   HGBA1C 6.7 (A) 02/21/2023    2. Prediabetes  - Ambulatory referral to diabetic education  -increase exercise  -strict diabetic diet  -stay well hydrated   Follow up:  Follow up in 3 months    Diabetes Mellitus and Nutrition, Adult When you have diabetes, or diabetes mellitus, it is very important to have healthy eating habits because your blood sugar (glucose) levels are greatly affected by what you eat and drink. Eating healthy foods in the right amounts, at about the same times every day, can help you: Manage your blood glucose. Lower your risk of heart disease. Improve your blood pressure. Reach or maintain a healthy weight. What can affect my meal plan? Every person with diabetes is different, and each person has different needs for a meal plan. Your health care provider may recommend that you work with a dietitian to make a meal plan that is best for you. Your meal plan may vary depending on factors such as: The calories you need. The medicines you take. Your weight. Your blood glucose, blood pressure, and cholesterol levels. Your activity level. Other health conditions you have, such as heart or kidney disease. How do carbohydrates affect me? Carbohydrates, also called carbs, affect your blood glucose level more than any other type of food. Eating carbs raises the amount of glucose in your blood. It is important to know how many carbs you can safely have in each meal. This is different for every person. Your dietitian can help you calculate how many carbs you should have at each meal and for each snack. How does alcohol affect me? Alcohol can cause a decrease in blood glucose (hypoglycemia), especially if you use insulin or take certain diabetes medicines by mouth. Hypoglycemia can be a life-threatening condition.  Symptoms of hypoglycemia, such as sleepiness, dizziness, and confusion, are similar to symptoms of having too much alcohol. Do not drink alcohol if: Your health care provider tells you not to drink. You are pregnant, may be pregnant, or are planning to become pregnant. If you drink alcohol: Limit how much you have to: 0-1 drink a day for women. 0-2 drinks a day for men. Know how much alcohol is in your drink. In the [place] one drink equals one 12 oz bottle of beer (355 mL), one 5 oz glass of wine (148 mL), or one 1 oz glass of hard liquor (44 mL). Keep yourself hydrated with water, diet soda, or unsweetened iced tea. Keep in mind that regular soda, juice, and other mixers may contain a lot of sugar and must be counted as carbs. What are tips for following this plan?  Reading food labels Start by checking the serving size on the Nutrition Facts label of packaged foods and drinks. The number of calories and the amount of carbs, fats, and other nutrients listed on the label are based on one serving of the item. Many items contain more than one serving per package. Check the total grams (g) of carbs in one serving. Check the number of grams of saturated fats and trans fats in one serving. Choose foods that have a low amount or none of these fats. Check the number of milligrams (mg) of salt (sodium) in one serving. Most people should limit total sodium intake to less than 2,300 mg per day. Always check the nutrition information of foods labeled  as "low-fat" or "nonfat." These foods may be higher in added sugar or refined carbs and should be avoided. Talk to your dietitian to identify your daily goals for nutrients listed on the label. Shopping Avoid buying canned, pre-made, or processed foods. These foods tend to be high in fat, sodium, and added sugar. Shop around the outside edge of the grocery store. This is where you will most often find fresh fruits and vegetables, bulk grains, fresh meats, and  fresh dairy products. Cooking Use low-heat cooking methods, such as baking, instead of high-heat cooking methods, such as deep frying. Cook using healthy oils, such as olive, canola, or sunflower oil. Avoid cooking with butter, cream, or high-fat meats. Meal planning Eat meals and snacks regularly, preferably at the same times every day. Avoid going long periods of time without eating. Eat foods that are high in fiber, such as fresh fruits, vegetables, beans, and whole grains. Eat 4-6 oz (112-168 g) of lean protein each day, such as lean meat, chicken, fish, eggs, or tofu. One ounce (oz) (28 g) of lean protein is equal to: 1 oz (28 g) of meat, chicken, or fish. 1 egg.  cup (62 g) of tofu. Eat some foods each day that contain healthy fats, such as avocado, nuts, seeds, and fish. What foods should I eat? Fruits Berries. Apples. Oranges. Peaches. Apricots. Plums. Grapes. Mangoes. Papayas. Pomegranates. Kiwi. Cherries. Vegetables Leafy greens, including lettuce, spinach, kale, chard, collard greens, mustard greens, and cabbage. Beets. Cauliflower. Broccoli. Carrots. Green beans. Tomatoes. Peppers. Onions. Cucumbers. Brussels sprouts. Grains Whole grains, such as whole-wheat or whole-grain bread, crackers, tortillas, cereal, and pasta. Unsweetened oatmeal. Quinoa. Brown or wild rice. Meats and other proteins Seafood. Poultry without skin. Lean cuts of poultry and beef. Tofu. Nuts. Seeds. Dairy Low-fat or fat-free dairy products such as milk, yogurt, and cheese. The items listed above may not be a complete list of foods and beverages you can eat and drink. Contact a dietitian for more information. What foods should I avoid? Fruits Fruits canned with syrup. Vegetables Canned vegetables. Frozen vegetables with butter or cream sauce. Grains Refined white flour and flour products such as bread, pasta, snack foods, and cereals. Avoid all processed foods. Meats and other proteins Fatty cuts of  meat. Poultry with skin. Breaded or fried meats. Processed meat. Avoid saturated fats. Dairy Full-fat yogurt, cheese, or milk. Beverages Sweetened drinks, such as soda or iced tea. The items listed above may not be a complete list of foods and beverages you should avoid. Contact a dietitian for more information. Questions to ask a health care provider Do I need to meet with a certified diabetes care and education specialist? Do I need to meet with a dietitian? What number can I call if I have questions? When are the best times to check my blood glucose? Where to find more information: American Diabetes Association: diabetes.org Academy of Nutrition and Dietetics: eatright.[employer] of Diabetes and Digestive and Kidney Diseases: [employer] Association of Diabetes Care & Education Specialists: diabeteseducator.org Summary It is important to have healthy eating habits because your blood sugar (glucose) levels are greatly affected by what you eat and drink. It is important to use alcohol carefully. A healthy meal plan will help you manage your blood glucose and lower your risk of heart disease. Your health care provider may recommend that you work with a dietitian to make a meal plan that is best for you. This information is not intended to replace advice given to you  by your health care provider. Make sure you discuss any questions you have with your health care provider. Document Revised: 07/16/2020 Document Reviewed: 07/16/2020 Elsevier Patient Education  [place].   Diabetes Mellitus Basics  Diabetes mellitus, or diabetes, is a long-term (chronic) disease. It occurs when the body does not properly use sugar (glucose) that is released from food after you eat. Diabetes mellitus may be caused by one or both of these problems: Your pancreas does not make enough of a hormone called insulin. Your body does not react in a normal way to the insulin that it  makes. Insulin lets glucose enter cells in your body. This gives you energy. If you have diabetes, glucose cannot get into cells. This causes high blood glucose (hyperglycemia). How to treat and manage diabetes You may need to take insulin or other diabetes medicines daily to keep your glucose in balance. If you are prescribed insulin, you will learn how to give yourself insulin by injection. You may need to adjust the amount of insulin you take based on the foods that you eat. You will need to check your blood glucose levels using a glucose monitor as told by your health care provider. The readings can help determine if you have low or high blood glucose. Generally, you should have these blood glucose levels: Before meals (preprandial): 80-130 mg/dL (4.4-7.2 mmol/L). After meals (postprandial): below 180 mg/dL (10 mmol/L). Hemoglobin A1c (HbA1c) level: less than 7%. Your health care provider will set treatment goals for you. Keep all follow-up visits. This is important. Follow these instructions at home: Diabetes medicines Take your diabetes medicines every day as told by your health care provider. List your diabetes medicines here: Name of medicine: ______________________________ Amount (dose): _______________ Time (a.m./p.m.): _______________ Notes: ___________________________________ Name of medicine: ______________________________ Amount (dose): _______________ Time (a.m./p.m.): _______________ Notes: ___________________________________ Name of medicine: ______________________________ Amount (dose): _______________ Time (a.m./p.m.): _______________ Notes: ___________________________________ Insulin If you use insulin, list the types of insulin you use here: Insulin type: ______________________________ Amount (dose): _______________ Time (a.m./p.m.): _______________Notes: ___________________________________ Insulin type: ______________________________ Amount (dose): _______________ Time  (a.m./p.m.): _______________ Notes: ___________________________________ Insulin type: ______________________________ Amount (dose): _______________ Time (a.m./p.m.): _______________ Notes: ___________________________________ Insulin type: ______________________________ Amount (dose): _______________ Time (a.m./p.m.): _______________ Notes: ___________________________________ Insulin type: ______________________________ Amount (dose): _______________ Time (a.m./p.m.): _______________ Notes: ___________________________________ Managing blood glucose  Check your blood glucose levels using a glucose monitor as told by your health care provider. Write down the times that you check your glucose levels here: Time: _______________ Notes: ___________________________________ Time: _______________ Notes: ___________________________________ Time: _______________ Notes: ___________________________________ Time: _______________ Notes: ___________________________________ Time: _______________ Notes: ___________________________________ Time: _______________ Notes: ___________________________________  Low blood glucose Low blood glucose (hypoglycemia) is when glucose is at or below 70 mg/dL (3.9 mmol/L). Symptoms may include: Feeling: Hungry. Sweaty and clammy. Irritable or easily upset. Dizzy. Sleepy. Having: A fast heartbeat. A headache. A change in your vision. Numbness around the mouth, lips, or tongue. Having trouble with: Moving (coordination). Sleeping. Treating low blood glucose To treat low blood glucose, eat or drink something containing sugar right away. If you can think clearly and swallow safely, follow the 15:15 rule: Take 15 grams of a fast-acting carb (carbohydrate), as told by your health care provider. Some fast-acting carbs are: Glucose tablets: take 3-4 tablets. Hard candy: eat 3-5 pieces. Fruit juice: drink 4 oz (120 mL). Regular (not diet) soda: drink 4-6 oz (120-180  mL). Honey or sugar: eat 1 Tbsp (15 mL). Check your blood glucose levels 15 minutes after you  take the carb. If your glucose is still at or below 70 mg/dL (3.9 mmol/L), take 15 grams of a carb again. If your glucose does not go above 70 mg/dL (3.9 mmol/L) after 3 tries, get help right away. After your glucose goes back to normal, eat a meal or a snack within 1 hour. Treating very low blood glucose If your glucose is at or below 54 mg/dL (3 mmol/L), you have very low blood glucose (severe hypoglycemia). This is an emergency. Do not wait to see if the symptoms will go away. Get medical help right away. Call your local emergency services (911 in the [place] Do not drive yourself to the hospital. Questions to ask your health care provider Should I talk with a diabetes educator? What equipment will I need to care for myself at home? What diabetes medicines do I need? When should I take them? How often do I need to check my blood glucose levels? What number can I call if I have questions? When is my follow-up visit? Where can I find a support group for people with diabetes? Where to find more information American Diabetes Association: www.diabetes.org Association of Diabetes Care and Education Specialists: www.diabeteseducator.org Contact a health care provider if: Your blood glucose is at or above 240 mg/dL (13.3 mmol/L) for 2 days in a row. You have been sick or have had a fever for 2 days or more, and you are not getting better. You have any of these problems for more than 6 hours: You cannot eat or drink. You feel nauseous. You vomit. You have diarrhea. Get help right away if: Your blood glucose is lower than 54 mg/dL (3 mmol/L). You get confused. You have trouble thinking clearly. You have trouble breathing. These symptoms may represent a serious problem that is an emergency. Do not wait to see if the symptoms will go away. Get medical help right away. Call your local emergency  services (911 in the [place] Do not drive yourself to the hospital. Summary Diabetes mellitus is a chronic disease that occurs when the body does not properly use sugar (glucose) that is released from food after you eat. Take insulin and diabetes medicines as told. Check your blood glucose every day, as often as told. Keep all follow-up visits. This is important. This information is not intended to replace advice given to you by your health care provider. Make sure you discuss any questions you have with your health care provider. Document Revised: 04/15/2020 Document Reviewed: 04/15/2020 Elsevier Patient Education  [place].

## 2023-02-21 NOTE — Progress Notes (Unsigned)
$'@Patient'Q$  ID: Rhonda Hester, female    DOB: January 31, 1976, 47 y.o.   MRN: [id number]  No chief complaint on file.   Referring provider: Fenton Foy, NP  HPI      Allergies  Allergen Reactions   Acyclovir And Related Other (See Comments)    Patient states it "made me feel like I can't breath"    Bee Pollen Itching and Other (See Comments)     coughing   Citric Acid Other (See Comments)    AVOIDS DUE TO INTERSTITIAL CYSTITIS   Dust Mite Extract Itching and Other (See Comments)     coughing   Grapeseed Extract [Nutritional Supplements] Other (See Comments)    Throat itching and burning   Pineapple Itching    Burning in mouth and tingling lips   Pollen Extract Itching and Other (See Comments)     coughing   Nickel Rash    Immunization History  Administered Date(s) Administered   Dtap, Unspecified 03/25/1977, 06/15/1977, 12/02/1977, 04/05/1979, 05/14/1981   HPV 9-valent 11/15/2019   Influenza Whole 09/02/2011, 09/26/2012   Influenza,inj,Quad PF,6+ Mos 12/27/2012, 09/04/2015, 09/20/2017, 08/10/2018, 08/27/2019, 11/20/2019   Influenza-Unspecified 12/27/2012, 09/04/2015, 09/20/2017, 08/10/2018   Measles 04/29/1978   Mumps 04/29/1978   Pneumococcal Conjugate-13 11/14/2018   Pneumococcal Polysaccharide-23 05/10/2016   Polio, Unspecified 03/25/1977, 06/15/1977, 12/02/1977, 04/29/1978, 04/05/1979, 05/14/1981   Rubella 04/29/1978   Tdap 05/10/2016    Past Medical History:  Diagnosis Date   Allergic asthma    Allergy    SEASONAL   Anxiety    Breast mass 05/2017   lt breast lump   Eczema    Fibroid    GERD (gastroesophageal reflux disease)    History of anal fissures    History of Bell's palsy    2006  RIGHT SIDE--  RESOLVED   History of gastroesophageal reflux (GERD)    History of kidney stones    Hyperlipidemia    Interstitial cystitis    Psoriasis    Sarcoidosis of lung ([place])    DX 2006--  PULMOLOGIST--  DR [id number] on 2 liters of oxygen, goes to pain  clinic   Seasonal allergic rhinitis    Shortness of breath    Vaginal Pap smear, abnormal     Tobacco History: Social History   Tobacco Use  Smoking Status Some Days   Packs/day: 0.30   Years: 6.00   Total pack years: 1.80   Types: Cigars, Cigarettes   Last attempt to quit: 11/2016   Years since quitting: 6.2  Smokeless Tobacco Never  Tobacco Comments   "Smokes 1 black and mild occasionally"   Ready to quit: Not Answered Counseling given: Not Answered Tobacco comments: "Smokes 1 black and mild occasionally"   Outpatient Encounter Medications as of 02/21/2023  Medication Sig   Adalimumab 40 MG/0.8ML PNKT Inject into the skin.   albuterol (PROAIR HFA) 108 (90 Base) MCG/ACT inhaler Inhale 2 puffs into the lungs every 6 (six) hours as needed for shortness of breath.   DULERA 200-5 MCG/ACT AERO Inhale 2 puffs into the lungs 2 (two) times daily.   DULoxetine (CYMBALTA) 30 MG capsule Take 30 mg by mouth 2 (two) times daily.   ibuprofen (ADVIL) 800 MG tablet Take 1 tablet (800 mg total) by mouth every 8 (eight) hours as needed (pain).   oxyCODONE (ROXICODONE) 5 MG immediate release tablet Take 1 tablet (5 mg total) by mouth every 6 (six) hours as needed for up to 6 doses for severe pain or breakthrough  pain.   ammonium lactate (LAC-HYDRIN) 12 % cream Apply to feet once or twice daily (Patient not taking: Reported on 01/20/2023)   calcipotriene-betamethasone (TACLONEX) ointment Apply to body qhs for psoriasis (Patient not taking: Reported on 01/20/2023)   clindamycin (CLEOCIN-T) 1 % lotion Apply in morning to face for acne (Patient not taking: Reported on 01/20/2023)   clindamycin-benzoyl peroxide (BENZACLIN) gel Apply to face every morning for acne. Risk bleaching. (Patient not taking: Reported on 01/20/2023)   clobetasol cream (TEMOVATE) 0.05 % Apply to more severe areas psoriasis once to twice daily until improved. Avoid face, groin, underarms. (Patient not taking: Reported on 01/20/2023)    clotrimazole-betamethasone (LOTRISONE) cream Apply 1 application topically 2 (two) times daily. (Patient not taking: Reported on 01/20/2023)   clotrimazole-betamethasone (LOTRISONE) cream Apply 1 Application topically daily. (Patient not taking: Reported on 01/20/2023)   cyclobenzaprine (FLEXERIL) 10 MG tablet Take 1 tablet (10 mg total) by mouth 2 (two) times daily as needed for muscle spasms. (Patient not taking: Reported on 01/20/2023)   Fluocinolone Acetonide Body 0.01 % OIL Apply topically as needed (Patient not taking: Reported on [id number])   folic acid (FOLVITE) 1 MG tablet Take 1 mg by mouth daily. (Patient not taking: Reported on 02/21/2023)   ketoconazole (NIZORAL) 2 % shampoo Apply 1 Application topically 2 (two) times a week. (Patient not taking: Reported on 01/20/2023)   neomycin-polymyxin-hydrocortisone (CORTISPORIN) OTIC solution Place 3 drops into both ears 4 (four) times daily. (Patient not taking: Reported on 01/20/2023)   ondansetron (ZOFRAN ODT) 4 MG disintegrating tablet Take 1 tablet (4 mg total) by mouth every 8 (eight) hours as needed for nausea or vomiting. (Patient not taking: Reported on 02/21/2023)   pimecrolimus (ELIDEL) 1 % cream Apply to psoriasis on face/body 1-2 times daily. (Patient not taking: Reported on 01/20/2023)   tiZANidine (ZANAFLEX) 4 MG tablet Take 4 mg by mouth 3 (three) times daily.   tretinoin (RETIN-A) 0.025 % cream Apply a pea-sized amount to face every night as tolerated for acne. (Patient not taking: Reported on 01/20/2023)   [DISCONTINUED] famotidine (PEPCID) 20 MG tablet Take 1 tablet (20 mg total) by mouth 2 (two) times daily.   Facility-Administered Encounter Medications as of 02/21/2023  Medication   bupivacaine (MARCAINE) 0.5 % 15 mL, phenazopyridine (PYRIDIUM) 400 mg bladder mixture     Review of Systems  Review of Systems     Physical Exam  BP 139/85   Pulse 93   Temp (!) 97.2 F (36.2 C)   Wt 236 lb (107 kg)   SpO2 98%   BMI 36.42  kg/m   Wt Readings from Last 5 Encounters:  02/21/23 236 lb (107 kg)  01/20/23 230 lb (104.3 kg)  12/30/22 224 lb (101.6 kg)  10/18/22 228 lb 9.6 oz (103.7 kg)  07/22/22 228 lb 9.6 oz (103.7 kg)     Physical Exam   Lab Results:  CBC    Component Value Date/Time   WBC 7.6 01/20/2023 1107   WBC 7.3 09/22/2021 2051   RBC 4.49 01/20/2023 1107   RBC 4.57 09/22/2021 2051   HGB 13.2 01/20/2023 1107   HCT 40.5 01/20/2023 1107   PLT 301 01/20/2023 1107   MCV 90 01/20/2023 1107   MCH 29.4 01/20/2023 1107   MCH 28.7 09/22/2021 2051   MCHC 32.6 01/20/2023 1107   MCHC 33.0 09/22/2021 2051   RDW 12.4 01/20/2023 1107   LYMPHSABS 2.2 07/19/2021 1519   LYMPHSABS 1.1 09/01/2018 1116   MONOABS 0.4 07/19/2021  1519   EOSABS 0.3 07/19/2021 1519   EOSABS 0.6 (H) 09/01/2018 1116   BASOSABS 0.0 07/19/2021 1519   BASOSABS 0.0 09/01/2018 1116    BMET    Component Value Date/Time   NA 140 01/20/2023 1107   K 3.9 01/20/2023 1107   CL 105 01/20/2023 1107   CO2 19 (L) 01/20/2023 1107   GLUCOSE 220 (H) 01/20/2023 1107   GLUCOSE 115 (H) 09/22/2021 2051   BUN 9 01/20/2023 1107   CREATININE 0.71 01/20/2023 1107   CREATININE 0.65 08/09/2017 1018   CALCIUM 9.0 01/20/2023 1107   GFRNONAA >60 09/22/2021 2051   GFRNONAA >89 08/09/2017 1018   GFRAA 121 05/05/2020 1059   GFRAA >89 08/09/2017 1018    BNP No results found for: "BNP"  ProBNP    Component Value Date/Time   PROBNP 10.0 03/31/2018 1229    Imaging: MM DIAG BREAST TOMO BILATERAL  Result Date: 02/14/2023 CLINICAL DATA:  Patient complaining of waxing waning left breast pain, diffuse. No reported lumps. EXAM: DIGITAL DIAGNOSTIC BILATERAL MAMMOGRAM WITH TOMOSYNTHESIS TECHNIQUE: Bilateral digital diagnostic mammography and breast tomosynthesis was performed. COMPARISON:  Previous exam(s). ACR Breast Density Category c: The breasts are heterogeneously dense, which may obscure small masses. FINDINGS: There are no breast masses, areas  of significant asymmetry, areas of architectural distortion or suspicious calcifications. No mammographic change. IMPRESSION: No evidence of breast malignancy. RECOMMENDATION: Screening mammogram in one year.(Code:SM-B-01Y) I have discussed the findings and recommendations with the patient. If applicable, a reminder letter will be sent to the patient regarding the next appointment. BI-RADS CATEGORY  1: Negative. Electronically Signed   By: Lajean Manes M.D.   On: 02/14/2023 09:53     Assessment & Plan:   No problem-specific Assessment & Plan notes found for this encounter.     Fenton Foy, NP 02/21/2023

## 2023-02-22 ENCOUNTER — Encounter: Payer: Self-pay | Admitting: Cardiology

## 2023-02-22 ENCOUNTER — Ambulatory Visit: Payer: 59 | Attending: Cardiology | Admitting: Cardiology

## 2023-02-22 VITALS — BP 127/80 | HR 90 | Ht 67.5 in | Wt 234.0 lb

## 2023-02-22 DIAGNOSIS — R072 Precordial pain: Secondary | ICD-10-CM

## 2023-02-22 MED ORDER — METOPROLOL TARTRATE 100 MG PO TABS: 100.0000 mg | ORAL_TABLET | ORAL | 0 refills | Status: DC

## 2023-02-22 NOTE — Patient Instructions (Signed)
Medication Instructions:  The current medical regimen is effective;  continue present plan and medications.  *If you need a refill on your cardiac medications before your next appointment, please call your pharmacy*  Testing/Procedures: Your physician has requested that you have an echocardiogram. Echocardiography is a painless test that uses sound waves to create images of your heart. It provides your doctor with information about the size and shape of your heart and how well your heart's chambers and valves are working. This procedure takes approximately one hour. There are no restrictions for this procedure. Please do NOT wear cologne, perfume, aftershave, or lotions (deodorant is allowed). Please arrive 15 minutes prior to your appointment time.    Your cardiac CT will be scheduled at:   [facility] [address] [place], [place] [address] [phone]  Please arrive at the [facility] and Children's Entrance (Entrance C2) of [facility] 30 minutes prior to test start time. You can use the FREE valet parking offered at entrance C (encouraged to control the heart rate for the test)  Proceed to the [facility] Radiology Department (first floor) to check-in and test prep.  All radiology patients and guests should use entrance C2 at [facility], accessed from [facility], even though the hospital's physical address listed is [address]     Please follow these instructions carefully (unless otherwise directed):  On the Night Before the Test: Be sure to Drink plenty of water. Do not consume any caffeinated/decaffeinated beverages or chocolate 12 hours prior to your test. Do not take any antihistamines 12 hours prior to your test.  On the Day of the Test: Drink plenty of water until 1 hour prior to the test. Do not eat any food 1 hour prior to test. You may take your regular medications prior to the test.  Take metoprolol  (Lopressor) two hours prior to test. If you take Furosemide/Hydrochlorothiazide/Spironolactone, please HOLD on the morning of the test. FEMALES- please wear underwire-free bra if available, avoid dresses & tight clothing  After the Test: Drink plenty of water. After receiving IV contrast, you may experience a mild flushed feeling. This is normal. On occasion, you may experience a mild rash up to 24 hours after the test. This is not dangerous. If this occurs, you can take Benadryl 25 mg and increase your fluid intake. If you experience trouble breathing, this can be serious. If it is severe call 911 IMMEDIATELY. If it is mild, please call our office. If you take any of these medications: Glipizide/Metformin, Avandament, Glucavance, please do not take 48 hours after completing test unless otherwise instructed.  We will call to schedule your test 2-4 weeks out understanding that some insurance companies will need an authorization prior to the service being performed.   For non-scheduling related questions, please contact the cardiac imaging nurse navigator should you have any questions/concerns: Marchia Bond, Cardiac Imaging Nurse Navigator Gordy Clement, Cardiac Imaging Nurse Navigator [facility] Direct Office Dial: [phone]   For scheduling needs, including cancellations and rescheduling, please call [place], [phone].   Follow-Up: At [facility], you and your health needs are our priority.  As part of our continuing mission to provide you with exceptional heart care, we have created designated Provider Care Teams.  These Care Teams include your primary Cardiologist (physician) and Advanced Practice Providers (APPs -  Physician Assistants and Nurse Practitioners) who all work together to provide you with the care you  need, when you need it.  We recommend signing up for the patient portal called "MyChart".  Sign up information is provided on this  After Visit Summary.  MyChart is used to connect with patients for Virtual Visits (Telemedicine).  Patients are able to view lab/test results, encounter notes, upcoming appointments, etc.  Non-urgent messages can be sent to your provider as well.   To learn more about what you can do with MyChart, go to [email or web address].    Follow up will be determined after the above testing has been completed.

## 2023-02-22 NOTE — Progress Notes (Signed)
Cardiology Office Note:    Date:  02/22/2023   ID:  Rhonda Hester, DOB 1976/07/10, MRN [id number]  PCP:  Fenton Foy, NP   [facility] Cardiologist:  Candee Furbish, MD     Referring MD: Fenton Foy, NP    History of Present Illness:    Rhonda Hester is a 47 y.o. female here for evaluation of chest pain at request of Rhonda Arms, NP. Was seen in ER on 01/14/23, left sided breast pain radiate to sternum. Intermittent.  Sometimes she will feel discomfort worse when she is exerting herself or walking. LDL 101. NSR 78 with NSSTW changes.   Has risk factors of smoking, prediabetes No early family history of coronary disease. Denies any fevers chills nausea vomiting syncope bleeding   Past Medical History:  Diagnosis Date   Allergic asthma    Allergy    SEASONAL   Anxiety    Breast mass 05/2017   lt breast lump   Eczema    Fibroid    GERD (gastroesophageal reflux disease)    History of anal fissures    History of Bell's palsy    2006  RIGHT SIDE--  RESOLVED   History of gastroesophageal reflux (GERD)    History of kidney stones    Hyperlipidemia    Interstitial cystitis    Psoriasis    Sarcoidosis of lung ([place])    DX 2006--  PULMOLOGIST--  DR [id number]- on 2 liters of oxygen, goes to pain clinic   Seasonal allergic rhinitis    Shortness of breath    Vaginal Pap smear, abnormal     Past Surgical History:  Procedure Laterality Date   CYSTO WITH HYDRODISTENSION N/A 08/24/2013   Procedure: CYSTOSCOPY/HYDRODISTENSION with instillation of marcaine and pyridium;  Surgeon: Fredricka Bonine, MD;  Location: [facility];  Service: Urology;  Laterality: N/A;   CYSTOSCOPY/ HYDRODISTENTION/ BLADDER BX  06-09-2010   CYSTOSCOPY/URETEROSCOPY/HOLMIUM LASER/STENT PLACEMENT Right 01/30/2019   Procedure: CYSTOSCOPY/RETROGRADE/URETEROSCOPY/HOLMIUM LASER/STENT PLACEMENT;  Surgeon: Festus Aloe, MD;  Location: [facility];  Service:  Urology;  Laterality: Right;   DILATION AND CURETTAGE OF UTERUS  1997   EXTRACORPOREAL SHOCK WAVE LITHOTRIPSY Right 11/30/2018   Procedure: EXTRACORPOREAL SHOCK WAVE LITHOTRIPSY (ESWL);  Surgeon: Bjorn Loser, MD;  Location: [facility];  Service: Urology;  Laterality: Right;   IR TRANSCATHETER BX  06/14/2018   IR US GUIDE VASC ACCESS RIGHT  06/14/2018   IR VENOGRAM HEPATIC W HEMODYNAMIC EVALUATION  06/14/2018    Current Medications: Current Meds  Medication Sig   Adalimumab 40 MG/0.8ML PNKT Inject into the skin.   albuterol (PROAIR HFA) 108 (90 Base) MCG/ACT inhaler Inhale 2 puffs into the lungs every 6 (six) hours as needed for shortness of breath.   cyclobenzaprine (FLEXERIL) 10 MG tablet Take 1 tablet (10 mg total) by mouth 2 (two) times daily as needed for muscle spasms.   DULERA 200-5 MCG/ACT AERO Inhale 2 puffs into the lungs 2 (two) times daily.   ibuprofen (ADVIL) 800 MG tablet Take 1 tablet (800 mg total) by mouth every 8 (eight) hours as needed (pain).   metoprolol tartrate (LOPRESSOR) 100 MG tablet Take 1 tablet (100 mg total) by mouth as directed. Take one tablet (2) hours before your CT scan   oxyCODONE (ROXICODONE) 5 MG immediate release tablet Take 1 tablet (5 mg total) by mouth every 6 (six) hours as needed for up to 6 doses for severe pain or breakthrough pain.   tiZANidine (  ZANAFLEX) 4 MG tablet Take 4 mg by mouth 3 (three) times daily.     Allergies:   Acyclovir and related, Bee pollen, Citric acid, Dust mite extract, Grapeseed extract [nutritional supplements], Pineapple, Pollen extract, and Nickel   Social History   Socioeconomic History   Marital status: Single    Spouse name: Not on file   Number of children: 0   Years of education: Not on file   Highest education level: Some college, no degree  Occupational History   Occupation: disabled  Tobacco Use   Smoking status: Some Days    Packs/day: 0.30    Years: 6.00    Total pack years: 1.80    Types: Cigars,  Cigarettes    Last attempt to quit: 11/2016    Years since quitting: 6.2   Smokeless tobacco: Never   Tobacco comments:    "Smokes 1 black and mild occasionally"  Vaping Use   Vaping Use: Never used  Substance and Sexual Activity   Alcohol use: No    Alcohol/week: 0.0 standard drinks of alcohol   Drug use: No    Comment: HX MARIJUANA USE   Sexual activity: Not Currently    Birth control/protection: None    Comment: abstinence   Other Topics Concern   Not on file  Social History Narrative   Lives alone.   Social Determinants of Health   Financial Resource Strain: Low Risk  (12/30/2022)   Overall Financial Resource Strain (CARDIA)    Difficulty of Paying Living Expenses: Not hard at all  Food Insecurity: No Food Insecurity (12/30/2022)   Hunger Vital Sign    Worried About Running Out of Food in the Last Year: Never true    Ran Out of Food in the Last Year: Never true  Transportation Needs: No Transportation Needs (12/30/2022)   PRAPARE - Hydrologist (Medical): No    Lack of Transportation (Non-Medical): No  Physical Activity: Inactive (12/30/2022)   Exercise Vital Sign    Days of Exercise per Week: 0 days    Minutes of Exercise per Session: 0 min  Stress: Stress Concern Present (12/30/2022)   [place]    Feeling of Stress : To some extent  Social Connections: Socially Isolated (09/16/2021)   Social Connection and Isolation Panel [NHANES]    Frequency of Communication with Friends and Family: Once a week    Frequency of Social Gatherings with Friends and Family: Once a week    Attends Religious Services: Never    Marine scientist or Organizations: No    Attends Music therapist: Never    Marital Status: Never married     Family History: The patient's family history includes Brain cancer in her maternal grandmother; Depression in her maternal aunt; Hyperlipidemia in  an other family member; Seizures in her maternal uncle; Sleep apnea in an other family member. There is no history of Colon cancer or Stomach cancer.  ROS:   Please see the history of present illness.     All other systems reviewed and are negative.  EKGs/Labs/Other Studies Reviewed:    The following studies were reviewed today: Prior ER visits reviewed.  Chest x-ray personally reviewed.  Lab work reviewed.  ER notes reviewed  EKG:  As above, no ischemic changes noted on ECG.  Recent Labs: 01/20/2023: ALT 32; BUN 9; Creatinine, Ser 0.71; Hemoglobin 13.2; Platelets 301; Potassium 3.9; Sodium 140  Recent Lipid  Panel    Component Value Date/Time   CHOL 178 01/20/2023 1107   TRIG 158 (H) 01/20/2023 1107   HDL 49 01/20/2023 1107   CHOLHDL 3.6 01/20/2023 1107   CHOLHDL 4.0 08/09/2017 1018   VLDL 18 08/09/2017 1018   LDLCALC 101 (H) 01/20/2023 1107             Physical Exam:    VS:  BP 127/80 (BP Location: Left Arm, Patient Position: Sitting, Cuff Size: Normal)   Pulse 90   Ht 5' 7.5" (1.715 m)   Wt 234 lb (106.1 kg)   SpO2 96%   BMI 36.11 kg/m     Wt Readings from Last 3 Encounters:  02/22/23 234 lb (106.1 kg)  02/21/23 236 lb (107 kg)  01/20/23 230 lb (104.3 kg)     GEN:  Well nourished, well developed in no acute distress HEENT: Normal NECK: No JVD; No carotid bruits LYMPHATICS: No lymphadenopathy CARDIAC: RRR, no murmurs, rubs, gallops RESPIRATORY:  Clear to auscultation without rales, wheezing or rhonchi  ABDOMEN: Soft, non-tender, non-distended MUSCULOSKELETAL:  No edema; No deformity  SKIN: Warm and dry NEUROLOGIC:  Alert and oriented x 3 PSYCHIATRIC:  Normal affect   ASSESSMENT:    1. Precordial pain    PLAN:    In order of problems listed above:  Precordial chest pain -We will go ahead and check a coronary CT scan.  She will need 100 mg of metoprolol. - We will also check an echocardiogram to ensure proper structure and function of her  heart.  Sarcoidosis - Checking echocardiogram to ensure normal right and left sided function.  Valvular function. -Takes Humira.  Normally manifest as low back pain or knee pain.  Tobacco use - Continue to encourage cessation.  She was able to quit previously.  She did restart during the pandemic.         Medication Adjustments/Labs and Tests Ordered: Current medicines are reviewed at length with the patient today.  Concerns regarding medicines are outlined above.  Orders Placed This Encounter  Procedures   CT CORONARY MORPH W/CTA COR W/SCORE W/CA W/CM &/OR WO/CM   ECHOCARDIOGRAM COMPLETE   Meds ordered this encounter  Medications   metoprolol tartrate (LOPRESSOR) 100 MG tablet    Sig: Take 1 tablet (100 mg total) by mouth as directed. Take one tablet (2) hours before your CT scan    Dispense:  1 tablet    Refill:  0    Patient Instructions  Medication Instructions:  The current medical regimen is effective;  continue present plan and medications.  *If you need a refill on your cardiac medications before your next appointment, please call your pharmacy*  Testing/Procedures: Your physician has requested that you have an echocardiogram. Echocardiography is a painless test that uses sound waves to create images of your heart. It provides your doctor with information about the size and shape of your heart and how well your heart's chambers and valves are working. This procedure takes approximately one hour. There are no restrictions for this procedure. Please do NOT wear cologne, perfume, aftershave, or lotions (deodorant is allowed). Please arrive 15 minutes prior to your appointment time.    Your cardiac CT will be scheduled at:   [facility] [address] [place], [place] [address] [phone]  Please arrive at the [facility] and Children's Entrance (Entrance C2) of [facility] 30 minutes prior to test start time. You can use the FREE valet parking  offered at  entrance C (encouraged to control the heart rate for the test)  Proceed to the [facility] Radiology Department (first floor) to check-in and test prep.  All radiology patients and guests should use entrance C2 at [facility], accessed from [facility], even though the hospital's physical address listed is [address].     Please follow these instructions carefully (unless otherwise directed):  On the Night Before the Test: Be sure to Drink plenty of water. Do not consume any caffeinated/decaffeinated beverages or chocolate 12 hours prior to your test. Do not take any antihistamines 12 hours prior to your test.  On the Day of the Test: Drink plenty of water until 1 hour prior to the test. Do not eat any food 1 hour prior to test. You may take your regular medications prior to the test.  Take metoprolol (Lopressor) two hours prior to test. If you take Furosemide/Hydrochlorothiazide/Spironolactone, please HOLD on the morning of the test. FEMALES- please wear underwire-free bra if available, avoid dresses & tight clothing  After the Test: Drink plenty of water. After receiving IV contrast, you may experience a mild flushed feeling. This is normal. On occasion, you may experience a mild rash up to 24 hours after the test. This is not dangerous. If this occurs, you can take Benadryl 25 mg and increase your fluid intake. If you experience trouble breathing, this can be serious. If it is severe call 911 IMMEDIATELY. If it is mild, please call our office. If you take any of these medications: Glipizide/Metformin, Avandament, Glucavance, please do not take 48 hours after completing test unless otherwise instructed.  We will call to schedule your test 2-4 weeks out understanding that some insurance companies will need an authorization prior to the service being performed.   For non-scheduling related questions, please contact the cardiac imaging nurse  navigator should you have any questions/concerns: Marchia Bond, Cardiac Imaging Nurse Navigator Gordy Clement, Cardiac Imaging Nurse Navigator [facility] Direct Office Dial: [phone]   For scheduling needs, including cancellations and rescheduling, please call [place], [phone].   Follow-Up: At [facility], you and your health needs are our priority.  As part of our continuing mission to provide you with exceptional heart care, we have created designated Provider Care Teams.  These Care Teams include your primary Cardiologist (physician) and Advanced Practice Providers (APPs -  Physician Assistants and Nurse Practitioners) who all work together to provide you with the care you need, when you need it.  We recommend signing up for the patient portal called "MyChart".  Sign up information is provided on this After Visit Summary.  MyChart is used to connect with patients for Virtual Visits (Telemedicine).  Patients are able to view lab/test results, encounter notes, upcoming appointments, etc.  Non-urgent messages can be sent to your provider as well.   To learn more about what you can do with MyChart, go to [email or web address].    Follow up will be determined after the above testing has been completed.     Signed, Candee Furbish, MD  02/22/2023 3:51 PM    [place]

## 2023-02-23 ENCOUNTER — Encounter: Payer: Self-pay | Admitting: Nurse Practitioner

## 2023-02-23 DIAGNOSIS — R7301 Impaired fasting glucose: Secondary | ICD-10-CM | POA: Insufficient documentation

## 2023-02-23 NOTE — Assessment & Plan Note (Signed)
-   POCT glycosylated hemoglobin (Hb A1C) - Ambulatory referral to diabetic education  Lab Results  Component Value Date   HGBA1C 6.7 (A) 02/21/2023    2. Prediabetes  - Ambulatory referral to diabetic education  -increase exercise  -strict diabetic diet  -stay well hydrated   Follow up:  Follow up in 3 months

## 2023-03-01 ENCOUNTER — Telehealth (HOSPITAL_COMMUNITY): Payer: Self-pay | Admitting: *Deleted

## 2023-03-01 NOTE — Telephone Encounter (Signed)
Attempted to call patient regarding upcoming cardiac CT appointment. °Left message on voicemail with name and callback number ° °Elta Angell RN Navigator Cardiac Imaging °[facility] °[phone] Office °[phone] Cell ° °

## 2023-03-01 NOTE — Telephone Encounter (Signed)
Patient returning call about her upcoming cardiac imaging study; pt verbalizes understanding of appt date/time, parking situation and where to check in, pre-test NPO status and medications ordered, and verified current allergies; name and call back number provided for further questions should they arise  Gordy Clement RN Navigator Cardiac Imaging [facility] [phone] office [phone] cell  Patient to take '100mg'$  metoprolol tartrate two hours prior to her cardiac CT scan. She is aware to arrive at 1:30 PM.

## 2023-03-02 ENCOUNTER — Ambulatory Visit (HOSPITAL_COMMUNITY)
Admission: RE | Admit: 2023-03-02 | Discharge: 2023-03-02 | Disposition: A | Discharge status: Home / Self Care | Payer: 59 | Source: Ambulatory Visit | Attending: Cardiology | Admitting: Cardiology

## 2023-03-02 DIAGNOSIS — R072 Precordial pain: Secondary | ICD-10-CM | POA: Diagnosis not present

## 2023-03-02 MED ORDER — NITROGLYCERIN 0.4 MG SL SUBL
0.8000 mg | SUBLINGUAL_TABLET | Freq: Once | SUBLINGUAL | Status: AC
  Administered 2023-03-02: 0.8 mg via SUBLINGUAL

## 2023-03-02 MED ORDER — NITROGLYCERIN 0.4 MG SL SUBL
SUBLINGUAL_TABLET | SUBLINGUAL | Status: AC
  Filled 2023-03-02: qty 2

## 2023-03-02 MED ORDER — IOHEXOL 350 MG/ML SOLN
100.0000 mL | Freq: Once | INTRAVENOUS | Status: AC | PRN
  Administered 2023-03-02: 100 mL via INTRAVENOUS

## 2023-03-16 ENCOUNTER — Ambulatory Visit (INDEPENDENT_AMBULATORY_CARE_PROVIDER_SITE_OTHER): Payer: 59

## 2023-03-16 DIAGNOSIS — Z3042 Encounter for surveillance of injectable contraceptive: Secondary | ICD-10-CM

## 2023-03-16 MED ORDER — MEDROXYPROGESTERONE ACETATE 150 MG/ML IM SUSY
150.0000 mg | PREFILLED_SYRINGE | INTRAMUSCULAR | Status: AC
  Administered 2023-03-16: 150 mg via INTRAMUSCULAR

## 2023-03-18 ENCOUNTER — Other Ambulatory Visit: Payer: Self-pay | Admitting: Nurse Practitioner

## 2023-03-18 DIAGNOSIS — D8689 Sarcoidosis of other sites: Secondary | ICD-10-CM

## 2023-03-18 DIAGNOSIS — K760 Fatty (change of) liver, not elsewhere classified: Secondary | ICD-10-CM

## 2023-03-23 ENCOUNTER — Ambulatory Visit (HOSPITAL_COMMUNITY): Payer: 59 | Attending: Cardiology

## 2023-03-23 DIAGNOSIS — R072 Precordial pain: Secondary | ICD-10-CM | POA: Insufficient documentation

## 2023-03-23 LAB — ECHOCARDIOGRAM COMPLETE
Area-P 1/2: 4.23 cm2
S' Lateral: 2.8 cm

## 2023-03-23 MED ORDER — PERFLUTREN LIPID MICROSPHERE
1.0000 mL | INTRAVENOUS | Status: AC | PRN
  Administered 2023-03-23: 1 mL via INTRAVENOUS

## 2023-03-24 ENCOUNTER — Ambulatory Visit (INDEPENDENT_AMBULATORY_CARE_PROVIDER_SITE_OTHER): Payer: 59 | Admitting: Nurse Practitioner

## 2023-03-24 ENCOUNTER — Encounter: Payer: Self-pay | Admitting: Nurse Practitioner

## 2023-03-24 VITALS — BP 130/77 | HR 77 | Temp 97.7°F | Ht 67.0 in | Wt 232.6 lb

## 2023-03-24 DIAGNOSIS — H6690 Otitis media, unspecified, unspecified ear: Secondary | ICD-10-CM

## 2023-03-24 MED ORDER — OFLOXACIN 0.3 % OT SOLN: 5.0000 [drp] | Freq: Every day | OTIC | 0 refills | Status: AC

## 2023-03-24 MED ORDER — CEFDINIR 300 MG PO CAPS: 300.0000 mg | ORAL_CAPSULE | Freq: Two times a day (BID) | ORAL | 0 refills | Status: AC

## 2023-03-24 NOTE — Patient Instructions (Signed)
1. Acute otitis media, unspecified otitis media type  - ofloxacin (FLOXIN) 0.3 % OTIC solution; Place 5 drops into the right ear daily for 7 days.  Dispense: 5 mL; Refill: 0 - cefdinir (OMNICEF) 300 MG capsule; Take 1 capsule (300 mg total) by mouth 2 (two) times daily for 10 days.  Dispense: 20 capsule; Refill: 0   Follow up:  Follow up as needed

## 2023-03-24 NOTE — Progress Notes (Signed)
@Patient  ID: Rhonda Hester, female    DOB: November 20, 1976, 47 y.o.   MRN: [id number]  Chief Complaint  Patient presents with   Ear Pain    Ear pain Started two days ago.    Referring provider: Fenton Foy, NP   HPI  Patient presents today for right ear pain.  She does have some purulent drainage from the ear.  She states that she is having difficulty hearing.  We will order ofloxacin otic solution and Omnicef. Denies f/c/s, n/v/d, hemoptysis, PND, leg swelling Denies chest pain or edema      Allergies  Allergen Reactions   Acyclovir And Related Other (See Comments)    Patient states it "made me feel like I can't breath"    Bee Pollen Itching and Other (See Comments)     coughing   Citric Acid Other (See Comments)    AVOIDS DUE TO INTERSTITIAL CYSTITIS   Dust Mite Extract Itching and Other (See Comments)     coughing   Grapeseed Extract [Nutritional Supplements] Other (See Comments)    Throat itching and burning   Pineapple Itching    Burning in mouth and tingling lips   Pollen Extract Itching and Other (See Comments)     coughing   Nickel Rash    Immunization History  Administered Date(s) Administered   Dtap, Unspecified 03/25/1977, 06/15/1977, 12/02/1977, 04/05/1979, 05/14/1981   HPV 9-valent 11/15/2019   Influenza Whole 09/02/2011, 09/26/2012   Influenza,inj,Quad PF,6+ Mos 12/27/2012, 09/04/2015, 09/20/2017, 08/10/2018, 08/27/2019, 11/20/2019   Influenza-Unspecified 12/27/2012, 09/04/2015, 09/20/2017, 08/10/2018   Measles 04/29/1978   Mumps 04/29/1978   Pneumococcal Conjugate-13 11/14/2018   Pneumococcal Polysaccharide-23 05/10/2016   Polio, Unspecified 03/25/1977, 06/15/1977, 12/02/1977, 04/29/1978, 04/05/1979, 05/14/1981   Rubella 04/29/1978   Tdap 05/10/2016    Past Medical History:  Diagnosis Date   Allergic asthma    Allergy    SEASONAL   Anxiety    Breast mass 05/2017   lt breast lump   Eczema    Fibroid    GERD (gastroesophageal  reflux disease)    History of anal fissures    History of Bell's palsy    2006  RIGHT SIDE--  RESOLVED   History of gastroesophageal reflux (GERD)    History of kidney stones    Hyperlipidemia    Interstitial cystitis    Psoriasis    Sarcoidosis of lung ([place])    DX 2006--  PULMOLOGIST--  DR [id number] on 2 liters of oxygen, goes to pain clinic   Seasonal allergic rhinitis    Shortness of breath    Vaginal Pap smear, abnormal     Tobacco History: Social History   Tobacco Use  Smoking Status Some Days   Packs/day: 0.30   Years: 6.00   Additional pack years: 0.00   Total pack years: 1.80   Types: Cigars, Cigarettes   Last attempt to quit: 11/2016   Years since quitting: 6.3  Smokeless Tobacco Never  Tobacco Comments   "Smokes 1 black and mild occasionally"   Ready to quit: Not Answered Counseling given: Not Answered Tobacco comments: "Smokes 1 black and mild occasionally"   Outpatient Encounter Medications as of 03/24/2023  Medication Sig   Adalimumab 40 MG/0.8ML PNKT Inject into the skin.   albuterol (PROAIR HFA) 108 (90 Base) MCG/ACT inhaler Inhale 2 puffs into the lungs every 6 (six) hours as needed for shortness of breath.   cefdinir (OMNICEF) 300 MG capsule Take 1 capsule (300 mg total) by mouth 2 (two)  times daily for 10 days.   cyclobenzaprine (FLEXERIL) 10 MG tablet Take 1 tablet (10 mg total) by mouth 2 (two) times daily as needed for muscle spasms.   DULERA 200-5 MCG/ACT AERO Inhale 2 puffs into the lungs 2 (two) times daily.   ibuprofen (ADVIL) 800 MG tablet Take 1 tablet (800 mg total) by mouth every 8 (eight) hours as needed (pain).   metoprolol tartrate (LOPRESSOR) 100 MG tablet Take 1 tablet (100 mg total) by mouth as directed. Take one tablet (2) hours before your CT scan   ofloxacin (FLOXIN) 0.3 % OTIC solution Place 5 drops into the right ear daily for 7 days.   oxyCODONE (ROXICODONE) 5 MG immediate release tablet Take 1 tablet (5 mg total) by mouth every 6  (six) hours as needed for up to 6 doses for severe pain or breakthrough pain.   tiZANidine (ZANAFLEX) 4 MG tablet Take 4 mg by mouth 3 (three) times daily.   Fluocinolone Acetonide Body 0.01 % OIL Apply topically as needed (Patient not taking: Reported on 03/24/2023)   [DISCONTINUED] famotidine (PEPCID) 20 MG tablet Take 1 tablet (20 mg total) by mouth 2 (two) times daily.   Facility-Administered Encounter Medications as of 03/24/2023  Medication   bupivacaine (MARCAINE) 0.5 % 15 mL, phenazopyridine (PYRIDIUM) 400 mg bladder mixture     Review of Systems  Review of Systems  Constitutional: Negative.   HENT:  Positive for ear pain.   Cardiovascular: Negative.   Gastrointestinal: Negative.   Allergic/Immunologic: Negative.   Neurological: Negative.   Psychiatric/Behavioral: Negative.         Physical Exam  BP 130/77   Pulse 77   Temp 97.7 F (36.5 C)   Ht 5\' 7"  (1.702 m)   Wt 232 lb 9.6 oz (105.5 kg)   SpO2 97%   BMI 36.43 kg/m   Wt Readings from Last 5 Encounters:  03/24/23 232 lb 9.6 oz (105.5 kg)  02/22/23 234 lb (106.1 kg)  02/21/23 236 lb (107 kg)  01/20/23 230 lb (104.3 kg)  12/30/22 224 lb (101.6 kg)     Physical Exam Vitals and nursing note reviewed.  Constitutional:      General: She is not in acute distress.    Appearance: She is well-developed.  HENT:     Right Ear: Tympanic membrane is erythematous and bulging.  Cardiovascular:     Rate and Rhythm: Normal rate and regular rhythm.  Pulmonary:     Effort: Pulmonary effort is normal.     Breath sounds: Normal breath sounds.  Neurological:     Mental Status: She is alert and oriented to person, place, and time.      Lab Results:  CBC    Component Value Date/Time   WBC 7.6 01/20/2023 1107   WBC 7.3 09/22/2021 2051   RBC 4.49 01/20/2023 1107   RBC 4.57 09/22/2021 2051   HGB 13.2 01/20/2023 1107   HCT 40.5 01/20/2023 1107   PLT 301 01/20/2023 1107   MCV 90 01/20/2023 1107   MCH 29.4  01/20/2023 1107   MCH 28.7 09/22/2021 2051   MCHC 32.6 01/20/2023 1107   MCHC 33.0 09/22/2021 2051   RDW 12.4 01/20/2023 1107   LYMPHSABS 2.2 07/19/2021 1519   LYMPHSABS 1.1 09/01/2018 1116   MONOABS 0.4 07/19/2021 1519   EOSABS 0.3 07/19/2021 1519   EOSABS 0.6 (H) 09/01/2018 1116   BASOSABS 0.0 07/19/2021 1519   BASOSABS 0.0 09/01/2018 1116    BMET    Component  Value Date/Time   NA 140 01/20/2023 1107   K 3.9 01/20/2023 1107   CL 105 01/20/2023 1107   CO2 19 (L) 01/20/2023 1107   GLUCOSE 220 (H) 01/20/2023 1107   GLUCOSE 115 (H) 09/22/2021 2051   BUN 9 01/20/2023 1107   CREATININE 0.71 01/20/2023 1107   CREATININE 0.65 08/09/2017 1018   CALCIUM 9.0 01/20/2023 1107   GFRNONAA >60 09/22/2021 2051   GFRNONAA >89 08/09/2017 1018   GFRAA 121 05/05/2020 1059   GFRAA >89 08/09/2017 1018    BNP No results found for: "BNP"  ProBNP    Component Value Date/Time   PROBNP 10.0 03/31/2018 1229    Imaging: ECHOCARDIOGRAM COMPLETE  Result Date: 03/23/2023    ECHOCARDIOGRAM REPORT   Patient Name:   KELYSE KERRIGAN Date of Exam: 03/23/2023 Medical Rec #:  [id number]           Height:       67.5 in Accession #:    [id number]          Weight:       234.0 lb Date of Birth:  12/20/1976           BSA:          2.174 m Patient Age:    52 years            BP:           127/80 mmHg Patient Gender: F                   HR:           70 bpm. Exam Location:  [facility] Procedure: 2D Echo, Cardiac Doppler, Color Doppler and Intracardiac            Opacification Agent Indications:    R07.9 Chest pain  History:        Patient has no prior history of Echocardiogram examinations.                 Signs/Symptoms:Shortness of Breath; Risk Factors:Dyslipidemia.                 Sarcoidosis.  Sonographer:    Wilford Sports Rodgers-Jones RDCS Referring Phys: [id number] MARK C SKAINS IMPRESSIONS  1. Left ventricular ejection fraction, by estimation, is 60 to 65%. The left ventricle has normal function. The left  ventricle has no regional wall motion abnormalities. Left ventricular diastolic parameters were normal.  2. Right ventricular systolic function is normal. The right ventricular size is mildly enlarged. Tricuspid regurgitation signal is inadequate for assessing PA pressure.  3. The mitral valve is normal in structure. Trivial mitral valve regurgitation.  4. The aortic valve is tricuspid. Aortic valve regurgitation is not visualized. No aortic stenosis is present.  5. Aortic dilatation noted. There is borderline dilatation of the ascending aorta, measuring 39 mm.  6. The inferior vena cava is normal in size with greater than 50% respiratory variability, suggesting right atrial pressure of 3 mmHg. Comparison(s): No prior Echocardiogram. FINDINGS  Left Ventricle: Left ventricular ejection fraction, by estimation, is 60 to 65%. The left ventricle has normal function. The left ventricle has no regional wall motion abnormalities. Definity contrast agent was given IV to delineate the left ventricular  endocardial borders. The left ventricular internal cavity size was normal in size. There is no left ventricular hypertrophy. Left ventricular diastolic parameters were normal. Right Ventricle: The right ventricular size is mildly enlarged. No increase in right ventricular wall thickness. Right ventricular systolic  function is normal. Tricuspid regurgitation signal is inadequate for assessing PA pressure. Left Atrium: Left atrial size was normal in size. Right Atrium: Right atrial size was normal in size. Pericardium: There is no evidence of pericardial effusion. Mitral Valve: The mitral valve is normal in structure. Trivial mitral valve regurgitation. Tricuspid Valve: The tricuspid valve is normal in structure. Tricuspid valve regurgitation is not demonstrated. Aortic Valve: The aortic valve is tricuspid. Aortic valve regurgitation is not visualized. No aortic stenosis is present. Pulmonic Valve: The pulmonic valve was normal  in structure. Pulmonic valve regurgitation is trivial. Aorta: Aortic dilatation noted. There is borderline dilatation of the ascending aorta, measuring 39 mm. Venous: The inferior vena cava is normal in size with greater than 50% respiratory variability, suggesting right atrial pressure of 3 mmHg. IAS/Shunts: The atrial septum is grossly normal.  LEFT VENTRICLE PLAX 2D LVIDd:         4.40 cm   Diastology LVIDs:         2.80 cm   LV e' medial:    8.67 cm/s LV PW:         0.90 cm   LV E/e' medial:  9.2 LV IVS:        0.90 cm   LV e' lateral:   15.20 cm/s LVOT diam:     1.90 cm   LV E/e' lateral: 5.2 LV SV:         71 LV SV Index:   33 LVOT Area:     2.84 cm  RIGHT VENTRICLE             IVC RV Basal diam:  4.50 cm     IVC diam: 1.40 cm RV S prime:     12.60 cm/s TAPSE (M-mode): 2.5 cm LEFT ATRIUM             Index        RIGHT ATRIUM           Index LA diam:        4.00 cm 1.84 cm/m   RA Area:     11.10 cm LA Vol (A2C):   33.3 ml 15.32 ml/m  RA Volume:   25.60 ml  11.78 ml/m LA Vol (A4C):   28.9 ml 13.29 ml/m LA Biplane Vol: 31.9 ml 14.68 ml/m  AORTIC VALVE LVOT Vmax:   126.33 cm/s LVOT Vmean:  83.400 cm/s LVOT VTI:    0.251 m  AORTA Ao Root diam: 2.90 cm Ao Asc diam:  3.90 cm MITRAL VALVE MV Area (PHT): 4.23 cm    SHUNTS MV Decel Time: 180 msec    Systemic VTI:  0.25 m MV E velocity: 79.40 cm/s  Systemic Diam: 1.90 cm MV A velocity: 66.05 cm/s MV E/A ratio:  1.20 Gwyndolyn Kaufman MD Electronically signed by Gwyndolyn Kaufman MD Signature Date/Time: 03/23/2023/2:31:38 PM    Final    CT CORONARY MORPH W/CTA COR W/SCORE Lewanda Rife W/CM &/OR WO/CM  Addendum Date: 03/04/2023   ADDENDUM REPORT: 03/04/2023 23:49 EXAM: OVER-READ INTERPRETATION  CT CHEST The following report is an over-read performed by radiologist Dr. Mina Marble [facility] on 03/04/2023. This over-read does not include interpretation of cardiac or coronary anatomy or pathology. The coronary CTA interpretation by the cardiologist is  attached. COMPARISON:  Chest CT 05/29/2018 FINDINGS: Vascular: No aortic atherosclerosis. The included aorta is normal in caliber. Mediastinum/nodes: Improved hilar adenopathy from prior CT. Unremarkable esophagus. Lungs: Mild bilateral lung scarring, with improvement in airspace disease from 2019 CT.  No pulmonary nodule. No pleural fluid. The included airways are patent. Upper abdomen: No acute or unexpected findings. Musculoskeletal: There are no acute or suspicious osseous abnormalities. IMPRESSION: 1. Bilateral lung scarring, with improvement in airspace disease from 2019, likely sequela of sarcoidosis. 2. Improved bilateral hilar adenopathy. Electronically Signed   By: Keith Rake M.D.   On: 03/04/2023 23:49   Result Date: 03/04/2023 CLINICAL DATA:  62F with pre-diabetes, tobacco abuse and chest pain. EXAM: Cardiac/Coronary  CT TECHNIQUE: The patient was scanned on a [employer]. FINDINGS: A 120 kV prospective scan was triggered in the descending thoracic aorta at 111 HU's. Axial non-contrast 3 mm slices were carried out through the heart. The data set was analyzed on a dedicated work station and scored using the Shaft. Gantry rotation speed was 250 msecs and collimation was .6 mm. No beta blockade and 0.8 mg of sl NTG was given. The 3D data set was reconstructed in 5% intervals of the 67-82 % of the R-R cycle. Diastolic phases were analyzed on a dedicated work station using MPR, MIP and VRT modes. The patient received 80 cc of contrast. Aorta: Normal size.  No calcifications.  No dissection. Aortic Valve:  Trileaflet.  No calcifications. Coronary Arteries:  Normal coronary origin.  Right dominance. RCA is a large dominant artery that gives rise to PDA and PLVB. There is minimal (<25%) soft plaque distally. Left main is a large artery that gives rise to LAD and LCX arteries. LAD is a large vessel that has no plaque. There are two large diagonals without plaque. LCX is a small,  non-dominant artery. There is no plaque. OM1 has no plaque. Coronary Calcium Score: 0 Other findings: Normal pulmonary vein drainage into the left atrium. Normal let atrial appendage without a thrombus. Normal size of the pulmonary artery. IMPRESSION: 1. Coronary calcium score of 0. This was 0 percentile for age-, race-, and sex-matched controls. 2. Normal coronary origin with right dominance. 3. There is minimal (<25%) soft plaque in the distal RCA. CAD-RADS 1. Skeet Latch, MD Electronically Signed: By: Skeet Latch M.D. On: 03/02/2023 17:47     Assessment & Plan:   1. Acute otitis media, unspecified otitis media type  - ofloxacin (FLOXIN) 0.3 % OTIC solution; Place 5 drops into the right ear daily for 7 days.  Dispense: 5 mL; Refill: 0 - cefdinir (OMNICEF) 300 MG capsule; Take 1 capsule (300 mg total) by mouth 2 (two) times daily for 10 days.  Dispense: 20 capsule; Refill: 0   Follow up:  Follow up as needed    Fenton Foy, NP 03/24/2023

## 2023-04-04 ENCOUNTER — Encounter: Payer: 59 | Attending: Nurse Practitioner | Admitting: Registered"

## 2023-04-04 ENCOUNTER — Encounter: Payer: Self-pay | Admitting: Registered"

## 2023-04-04 DIAGNOSIS — R7301 Impaired fasting glucose: Secondary | ICD-10-CM | POA: Insufficient documentation

## 2023-04-04 DIAGNOSIS — R7303 Prediabetes: Secondary | ICD-10-CM | POA: Diagnosis not present

## 2023-04-04 DIAGNOSIS — Z713 Dietary counseling and surveillance: Secondary | ICD-10-CM | POA: Insufficient documentation

## 2023-04-04 NOTE — Patient Instructions (Addendum)
Continue drinking 8 - 16 oz water when you wake up Work on eating breakfast. Ideas we talked about today Peanuts and banana Tuna and crackers Meal replacement (look for one with at least 10 g protein)  Fish in general is good to include at least 3 times per week. Omega 3 fatty acid may help reduce inflammation in your body.  Use the handout for ideas to make balanced meals. When eating rice or pasta consider have portions of less than 1 cup.

## 2023-04-04 NOTE — Progress Notes (Signed)
Medical Nutrition Therapy - pre-diabetes Appointment Start time:  1400  Appointment End time:  1450  Primary concerns today: dietary limitations  Referral diagnosis: pre-diabetes Preferred learning style: no preference indicated Learning readiness: contemplating  NUTRITION ASSESSMENT  Anthropometrics  Not assessed  Pt states she weighs herself often at home. Fluctuates 220-230 lbs. Pt states when she gets up to 230-something she know she needs to bring her weight down, doesn't do anything specific, thinks she just eats less or less junk food.   Clinical Medical Hx: Acid Reflux, Sarcoidosis, Eczema affecting hands and feet Medications: reviewed Labs: A1c 6.7% Notable Signs/Symptoms: constant pain (lymph nodes in chest?)  Lifestyle & Dietary Hx Pt reports she had an uncle who had amputations due to diabetes. Pt states she doesn't know much about it except that it can cause these types of problems.  Pt states sarcoidosis causes problems with pain and swelting as well as reactions to some foods including itch mouth and burning mouth with fresh pineapple, grapes caused trouble breathing. Pt reports that she is okay with nuts, strawberries and bananas, dried mango.  Acid Reflux: pt reports citric acid, orange juice, tomato, chocolate aggravate. Pt states she has not tried medication. Pt states several years ago she was told why she wasn't prescribed medication, but doesn't remember why.   Pt states she eats only eats one meal per day, usually around 11 pm.  Pt states she sometimes cannot sleep and had an irregular sleep pattern. Sleep is more regular last couple of months but still having difficulty.  Pt states she would like to walk her dogs, but due to pollen and pain she does not do it often.  Estimated daily fluid intake: 8-16 oz water (total not assessed this intake) Supplements: reviewed Sleep: goes to sleep 12-3 am - wakes up around noon Stress / self-care: has recently noticed  that she has stress. Pt states no self-care strategies. Current average weekly physical activity: ADL  24-Hr Dietary Recall First Meal: water, tea Snack:  Second Meal:  Snack:  Third Meal: [place] sandwich, chips, tea, choc chip cookies Snack:  Beverages: water, brisk tea/lemonade,   Estimated Energy Needs Calories: not assessed  NUTRITION DIAGNOSIS  NB-1.1 Food and nutrition-related knowledge deficit As related to importance of eating structured, balanced meals.  As evidenced by 1 meal per day late in the evening.  NUTRITION INTERVENTION  Nutrition education (E-1) on the following topics:  MyPlate Insulin and blood glucose for energy Insulin affected by stress hormones caused by pain and sarcoidosis  Handouts Provided Include  Planning healthy meals  Learning Style & Readiness for Change Teaching method utilized: Visual & Auditory  Demonstrated degree of understanding via: Teach Back  Barriers to learning/adherence to lifestyle change: pain  Goals Established by Pt Continue drinking 8 - 16 oz water when you wake up Work on eating breakfast. Ideas we talked about today Peanuts and banana Tuna and crackers Meal replacement (look for one with at least 10 g protein)  Fish in general is good to include at least 3 times per week. Omega 3 fatty acid may help reduce inflammation in your body.  Use the handout for ideas to make balanced meals. When eating rice or pasta consider have portions of less than 1 cup.   MONITORING & EVALUATION Dietary intake, weekly physical activity, and sleep in 6-8 weeks.

## 2023-04-07 ENCOUNTER — Ambulatory Visit (HOSPITAL_COMMUNITY)
Admission: EM | Admit: 2023-04-07 | Discharge: 2023-04-07 | Disposition: A | Discharge status: Home / Self Care | Payer: 59

## 2023-04-07 ENCOUNTER — Encounter (HOSPITAL_COMMUNITY): Payer: Self-pay | Admitting: *Deleted

## 2023-04-07 DIAGNOSIS — K209 Esophagitis, unspecified without bleeding: Secondary | ICD-10-CM

## 2023-04-07 MED ORDER — ALUM & MAG HYDROXIDE-SIMETH 200-200-20 MG/5ML PO SUSP
ORAL | Status: AC
  Filled 2023-04-07: qty 30

## 2023-04-07 MED ORDER — LIDOCAINE VISCOUS HCL 2 % MT SOLN
OROMUCOSAL | Status: AC
  Filled 2023-04-07: qty 15

## 2023-04-07 MED ORDER — LIDOCAINE VISCOUS HCL 2 % MT SOLN
15.0000 mL | Freq: Once | OROMUCOSAL | Status: AC
  Administered 2023-04-07: 15 mL via OROMUCOSAL

## 2023-04-07 MED ORDER — ALUMINUM-MAGNESIUM-SIMETHICONE 200-200-20 MG/5ML PO SUSP: 15.0000 mL | Freq: Three times a day (TID) | ORAL | 0 refills | Status: DC | PRN

## 2023-04-07 MED ORDER — ALUM & MAG HYDROXIDE-SIMETH 200-200-20 MG/5ML PO SUSP
30.0000 mL | Freq: Once | ORAL | Status: AC
  Administered 2023-04-07: 30 mL via ORAL

## 2023-04-07 MED ORDER — PANTOPRAZOLE SODIUM 40 MG PO TBEC: 40.0000 mg | DELAYED_RELEASE_TABLET | Freq: Every day | ORAL | 0 refills | Status: DC

## 2023-04-07 NOTE — Discharge Instructions (Addendum)
Please take the Protonix once daily for the next 2 weeks.  Take first thing in the morning on an empty stomach.  Sit upright for 30 minutes after taking. Make sure you are drinking lots of water!  You can also try the Maalox liquid up to 3 times daily.  I recommend to call the gastroenterologist clinic and set up an appointment.  If at any point your symptoms worsen please go directly to the emergency department.

## 2023-04-07 NOTE — ED Provider Notes (Signed)
[facility]    CSN: [id number] Arrival date & time: 04/07/23  1532     History   Chief Complaint Chief Complaint  Patient presents with   Chest Pain    HPI Rhonda Hester is a 47 y.o. female.  Here with pain extending from neck to mid chest. She's had this going on for the last 3 months, seen in ED and by cardiology. Work up has been normal. Just had coronary CTA on 3/6 that was negative She was worried because pain worsened last night. Feels more when she lays on her side. Denies worse with laying flat. Not associated with eating/drinking. No trigger foods. No fever, chills, cough, NVD.  Remote history of acid reflux Hx sarcoidosis. Lung scarring unchanged on CT  Past Medical History:  Diagnosis Date   Allergic asthma    Allergy    SEASONAL   Anxiety    Breast mass 05/2017   lt breast lump   Eczema    Fibroid    GERD (gastroesophageal reflux disease)    History of anal fissures    History of Bell's palsy    2006  RIGHT SIDE--  RESOLVED   History of gastroesophageal reflux (GERD)    History of kidney stones    Hyperlipidemia    Interstitial cystitis    Psoriasis    Sarcoidosis of lung    DX 2006--  PULMOLOGIST--  DR IWOE- on 2 liters of oxygen, goes to pain clinic   Seasonal allergic rhinitis    Shortness of breath    Vaginal Pap smear, abnormal     Patient Active Problem List   Diagnosis Date Noted   Impaired fasting blood sugar 02/23/2023   Intermittent chest pain 01/20/2023   Bronchitis 10/18/2022   Rash 07/22/2022   ASCUS with positive high risk HPV cervical 08/13/2021   Moderate episode of recurrent major depressive disorder 05/05/2020   Dysmenorrhea 04/23/2020   Dysplasia of cervix, low grade (CIN 1) 03/23/2020   HPV (human papilloma virus) infection 03/23/2020   Abnormal Pap smear of cervix 11/12/2019   Chronic back pain 10/03/2019   Eczema 10/03/2019   GERD (gastroesophageal reflux disease) 10/03/2019   Hyperlipidemia,  unspecified 10/03/2019   Iron deficiency anemia, unspecified 10/03/2019   Mild intermittent asthma without complication 10/03/2019   Obesity (BMI 30-39.9) 10/03/2019   Tobacco user 10/03/2019   Chronic pain syndrome 10/03/2019   Sarcoidosis of lung 08/22/2018   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   Enlarged submental lymph node 03/30/2018   Nodule of soft tissue 03/22/2018   Hemorrhoids 02/13/2018   TMJ (temporomandibular joint disorder) 12/23/2017   Acute pain of right shoulder 11/26/2017   Chronic bilateral low back pain without sciatica 08/01/2017   Chronic pain of right knee 08/01/2017   Depression 04/02/2017   Pelvic pain 03/28/2017   Obesity (BMI 30.0-34.9) 03/19/2017   Generalized pain 03/19/2017   Metabolic syndrome 03/19/2017   Medication management 03/19/2017   Tobacco abuse 01/24/2017   Uterine fibroid 01/17/2017   Chronic interstitial cystitis 01/17/2017   Menorrhagia 12/06/2016   Acne vulgaris 05/10/2016   BMI 31.0-31.9,adult 05/10/2016   Weight gain 05/10/2016   Right hip pain 10/27/2014   Cervical adenopathy 03/12/2014   History of smoking 08/07/2013   Allergic asthma 07/22/2011    Past Surgical History:  Procedure Laterality Date   CYSTO WITH HYDRODISTENSION N/A 08/24/2013   Procedure: CYSTOSCOPY/HYDRODISTENSION with instillation of marcaine and pyridium;  Surgeon: [name], MD;  Location: [facility]  [place];  Service: Urology;  Laterality: N/A;   CYSTOSCOPY/ HYDRODISTENTION/ BLADDER BX  06-09-2010   CYSTOSCOPY/URETEROSCOPY/HOLMIUM LASER/STENT PLACEMENT Right 01/30/2019   Procedure: CYSTOSCOPY/RETROGRADE/URETEROSCOPY/HOLMIUM LASER/STENT PLACEMENT;  Surgeon: Jerilee Field, MD;  Location: [facility];  Service: Urology;  Laterality: Right;   DILATION AND CURETTAGE OF UTERUS  1997   EXTRACORPOREAL SHOCK WAVE LITHOTRIPSY Right 11/30/2018   Procedure: EXTRACORPOREAL SHOCK WAVE LITHOTRIPSY (ESWL);  Surgeon: Alfredo Martinez, MD;  Location: [facility];  Service: Urology;  Laterality: Right;   IR TRANSCATHETER BX  06/14/2018   IR US GUIDE VASC ACCESS RIGHT  06/14/2018   IR VENOGRAM HEPATIC W HEMODYNAMIC EVALUATION  06/14/2018    OB History     Gravida  1   Para  0   Term  0   Preterm  0   AB  1   Living  0      SAB  1   IAB  0   Ectopic  0   Multiple  0   Live Births               Home Medications    Prior to Admission medications   Medication Sig Start Date End Date Taking? Authorizing Provider  Adalimumab 40 MG/0.8ML PNKT Inject into the skin. 04/06/22  Yes [provider]  aluminum-magnesium hydroxide-simethicone (MAALOX) 200-200-20 MG/5ML SUSP Take 15 mLs by mouth 3 (three) times daily as needed. 04/07/23  Yes Albana Saperstein, PA-C  DULERA 200-5 MCG/ACT AERO Inhale 2 puffs into the lungs 2 (two) times daily. 03/29/20  Yes [provider]  ibuprofen (ADVIL) 800 MG tablet Take 1 tablet (800 mg total) by mouth every 8 (eight) hours as needed (pain). 01/14/23  Yes Banister, Janace Aris, MD  oxyCODONE (ROXICODONE) 5 MG immediate release tablet Take 1 tablet (5 mg total) by mouth every 6 (six) hours as needed for up to 6 doses for severe pain or breakthrough pain. 07/01/21  Yes Fondaw, Wylder S, PA  pantoprazole (PROTONIX) 40 MG tablet Take 1 tablet (40 mg total) by mouth daily for 14 days. 04/07/23 04/21/23 Yes Nilza Eaker, Lurena Joiner, PA-C  pregabalin (LYRICA) 25 MG capsule Take 25 mg by mouth 3 (three) times daily. 03/18/23   [provider]  famotidine (PEPCID) 20 MG tablet Take 1 tablet (20 mg total) by mouth 2 (two) times daily. 05/24/19 12/01/20  Mike Gip, FNP    Family History Family History  Problem Relation Age of Onset   Brain cancer Maternal Grandmother    Hyperlipidemia Other    Sleep apnea Other    Depression Maternal Aunt    Seizures Maternal Uncle    Colon cancer Neg Hx    Stomach cancer Neg Hx     Social History Social History   Tobacco Use    Smoking status: Some Days    Packs/day: 0.30    Years: 6.00    Additional pack years: 0.00    Total pack years: 1.80    Types: Cigars, Cigarettes    Last attempt to quit: 11/2016    Years since quitting: 6.3   Smokeless tobacco: Never   Tobacco comments:    "Smokes 1 black and mild occasionally"  Vaping Use   Vaping Use: Never used  Substance Use Topics   Alcohol use: No    Alcohol/week: 0.0 standard drinks of alcohol   Drug use: No    Comment: HX MARIJUANA USE     Allergies   Acyclovir and related, Bee pollen, Citric acid, Dust  mite extract, Grapeseed extract [nutritional supplements], Pineapple, Pollen extract, and Nickel   Review of Systems Review of Systems As per HPI  Physical Exam Triage Vital Signs ED Triage Vitals  Enc Vitals Group     BP 04/07/23 1631 (!) 143/96     Pulse Rate 04/07/23 1631 84     Resp 04/07/23 1631 20     Temp 04/07/23 1631 98.5 F (36.9 C)     Temp Source 04/07/23 1631 Oral     SpO2 04/07/23 1631 94 %     Weight --      Height --      Head Circumference --      Peak Flow --      Pain Score 04/07/23 1628 9     Pain Loc --      Pain Edu? --      Excl. in GC? --    No data found.  Updated Vital Signs BP (!) 143/96 (BP Location: Left Arm)   Pulse 84   Temp 98.5 F (36.9 C) (Oral)   Resp 20   SpO2 94%   Physical Exam Vitals and nursing note reviewed.  Constitutional:      General: She is not in acute distress.    Appearance: She is not ill-appearing.  HENT:     Nose: No rhinorrhea.     Mouth/Throat:     Mouth: Mucous membranes are moist.     Pharynx: Oropharynx is clear. No posterior oropharyngeal erythema.  Eyes:     Conjunctiva/sclera: Conjunctivae normal.  Neck:     Thyroid: No thyroid tenderness.     Trachea: Trachea and phonation normal. No tracheal tenderness.  Cardiovascular:     Rate and Rhythm: Normal rate and regular rhythm.     Pulses: Normal pulses.     Heart sounds: Normal heart sounds.  Pulmonary:      Effort: Pulmonary effort is normal.     Breath sounds: Normal breath sounds.     Comments: Tender along xiphoid process into epigastric Chest:     Chest wall: Tenderness present.  Abdominal:     Palpations: There is no mass.     Tenderness: There is no abdominal tenderness. There is no guarding or rebound.  Musculoskeletal:     Cervical back: Normal range of motion. No rigidity.  Lymphadenopathy:     Cervical: No cervical adenopathy.  Skin:    General: Skin is warm and dry.  Neurological:     Mental Status: She is alert and oriented to person, place, and time.     UC Treatments / Results  Labs (all labs ordered are listed, but only abnormal results are displayed) Labs Reviewed - No data to display  EKG   Radiology No results found.  Procedures Procedures (including critical care time)  Medications Ordered in UC Medications  alum & mag hydroxide-simeth (MAALOX/MYLANTA) 200-200-20 MG/5ML suspension 30 mL (30 mLs Oral Given 04/07/23 1717)  lidocaine (XYLOCAINE) 2 % viscous mouth solution 15 mL (15 mLs Mouth/Throat Given 04/07/23 1717)    Initial Impression / Assessment and Plan / UC Course  I have reviewed the triage vital signs and the nursing notes.  Pertinent labs & imaging results that were available during my care of the patient were reviewed by me and considered in my medical decision making (see chart for details).  Considering GI etiology given distribution of pain down esophagus. She's had reassuring negative cardiac workups and is followed by them as well as PCP and  pulmonology.  Maalox and lidocaine given with minimal improvement. Recommend daily Protonix x 2 weeks, maalox TID prn, follow with GI. Strict ED precautions for worsening symptoms. Patient agreeable   Final Clinical Impressions(s) / UC Diagnoses   Final diagnoses:  Esophagitis     Discharge Instructions      Please take the Protonix once daily for the next 2 weeks.  Take first thing in the  morning on an empty stomach.  Sit upright for 30 minutes after taking. Make sure you are drinking lots of water!  You can also try the Maalox liquid up to 3 times daily.  I recommend to call the gastroenterologist clinic and set up an appointment.  If at any point your symptoms worsen please go directly to the emergency department.     ED Prescriptions     Medication Sig Dispense Auth. Provider   pantoprazole (PROTONIX) 40 MG tablet Take 1 tablet (40 mg total) by mouth daily for 14 days. 14 tablet Odyn Turko, PA-C   aluminum-magnesium hydroxide-simethicone (MAALOX) 200-200-20 MG/5ML SUSP Take 15 mLs by mouth 3 (three) times daily as needed. 335 mL Brisha Mccabe, Lurena Joiner, PA-C      PDMP not reviewed this encounter.   Demyan Fugate, Ray Church 04/07/23 1848

## 2023-04-07 NOTE — ED Triage Notes (Addendum)
Pt states she still has chest pain X 3 months was seen in ED, then followed up with cardio she was cleared. She states is constant pain from her esophagus down to the middle of her chest. She is taking pain meds for another issue 3 x a day and IBU without relief.   Pt states she is so scared something is wrong.

## 2023-04-19 ENCOUNTER — Ambulatory Visit
Admission: RE | Admit: 2023-04-19 | Discharge: 2023-04-19 | Disposition: A | Discharge status: Home / Self Care | Payer: 59 | Source: Ambulatory Visit | Attending: Nurse Practitioner | Admitting: Nurse Practitioner

## 2023-04-19 DIAGNOSIS — D8689 Sarcoidosis of other sites: Secondary | ICD-10-CM

## 2023-04-19 DIAGNOSIS — K760 Fatty (change of) liver, not elsewhere classified: Secondary | ICD-10-CM

## 2023-04-26 ENCOUNTER — Encounter: Payer: Self-pay | Admitting: Dermatology

## 2023-04-26 ENCOUNTER — Ambulatory Visit (INDEPENDENT_AMBULATORY_CARE_PROVIDER_SITE_OTHER): Payer: 59 | Admitting: Dermatology

## 2023-04-26 VITALS — BP 114/74

## 2023-04-26 DIAGNOSIS — L403 Pustulosis palmaris et plantaris: Secondary | ICD-10-CM

## 2023-04-26 MED ORDER — ACITRETIN 25 MG PO CAPS: 25.0000 mg | ORAL_CAPSULE | Freq: Every day | ORAL | 1 refills | Status: AC

## 2023-04-26 MED ORDER — TACROLIMUS 0.1 % EX OINT: TOPICAL_OINTMENT | Freq: Two times a day (BID) | CUTANEOUS | 1 refills | Status: DC

## 2023-04-26 NOTE — Patient Instructions (Addendum)
Samples given of Vytama to be applied once daily to areas on the body. Mix with Vaseline.   Due to recent changes in healthcare laws, you may see results of your pathology and/or laboratory studies on MyChart before the doctors have had a chance to review them. We understand that in some cases there may be results that are confusing or concerning to you. Please understand that not all results are received at the same time and often the doctors may need to interpret multiple results in order to provide you with the best plan of care or course of treatment. Therefore, we ask that you please give [place] 2 business days to thoroughly review all your results before contacting the office for clarification. Should we see a critical lab result, you will be contacted sooner.   If You Need Anything After Your Visit  If you have any questions or concerns for your doctor, please call our main line at [phone] If no one answers, please leave a voicemail as directed and we will return your call as soon as possible. Messages left after 4 pm will be answered the following business day.   You may also send [place] a message via MyChart. We typically respond to MyChart messages within 1-2 business days.  For prescription refills, please ask your pharmacy to contact our office. Our fax number is [phone].  If you have an urgent issue when the clinic is closed that cannot wait until the next business day, you can page your doctor at the number below.    Please note that while we do our best to be available for urgent issues outside of office hours, we are not available 24/7.   If you have an urgent issue and are unable to reach [place], you may choose to seek medical care at your doctor's office, retail clinic, urgent care center, or emergency room.  If you have a medical emergency, please immediately call 911 or go to the emergency department. In the event of inclement weather, please call our main line at  [phone] for an update on the status of any delays or closures.  Dermatology Medication Tips: Please keep the boxes that topical medications come in in order to help keep track of the instructions about where and how to use these. Pharmacies typically print the medication instructions only on the boxes and not directly on the medication tubes.   If your medication is too expensive, please contact our office at [phone] or send [place] a message through MyChart.   We are unable to tell what your co-pay for medications will be in advance as this is different depending on your insurance coverage. However, we may be able to find a substitute medication at lower cost or fill out paperwork to get insurance to cover a needed medication.   If a prior authorization is required to get your medication covered by your insurance company, please allow [place] 1-2 business days to complete this process.  Drug prices often vary depending on where the prescription is filled and some pharmacies may offer cheaper prices.  The website www.goodrx.com contains coupons for medications through different pharmacies. The prices here do not account for what the cost may be with help from insurance (it may be cheaper with your insurance), but the website can give you the price if you did not use any insurance.  - You can print the associated coupon and take it with your prescription to the pharmacy.  - You may also stop  by our office during regular business hours and pick up a GoodRx coupon card.  - If you need your prescription sent electronically to a different pharmacy, notify our office through [facility] or by phone at [phone]

## 2023-04-26 NOTE — Progress Notes (Unsigned)
New Patient Visit   Subjective  Rhonda Hester is a 47 y.o. female who presents for the following: Rash On hands, feet, and upper back, bilateral corners of mouth. Symptoms include itch of 9 on a scale of 1-10. She says there are painful pus filled bumps on hands feet x 3-4 years. None of the creams helped. Past Tx with Triamcinolone and Fluocinonide. She is on Humira for Sarcoidosis. Prior to Humira she was receiving Infusions of Infliximab- 2021 to treat her sarcoidosis.  Shortly after starting the Infliximab is when the pustular eruption started on her feet.  After no response to topical creams, the eruption was a suspected drug eruption so infliximab was changed to Humira.  The eruptions continued and spreading to her hands.  The following portions of the chart were reviewed this encounter and updated as appropriate: medications, allergies, medical history  Review of Systems:  No other skin or systemic complaints except as noted in HPI or Assessment and Plan.  Objective  Well appearing patient in no apparent distress; mood and affect are within normal limits.  A focused examination was performed of the following areas: Feet, hands, back  Relevant exam findings are noted in the Assessment and Plan.                      Component Ref Range & Units 3 mo ago 1 yr ago 2 yr ago 5 yr ago 13 yr ago  Cholesterol, Total 100 - 199 mg/dL 161 096 High  045    Triglycerides 0 - 149 mg/dL 409 High  811 914 90 R   HDL >39 mg/dL 49 54 57 38 Low  R 48  VLDL Cholesterol Cal 5 - 40 mg/dL 28 21 20     LDL Chol Calc (NIH) 0 - 99 mg/dL 782 High  956 High  213 High     Chol/HDL Ratio 0.0 - 4.4 ratio 3.6 3.8 CM 3.5 CM 4.0 R      1 Patient Communication          Component Ref Range & Units 3 mo ago (01/20/23) 1 yr ago (09/22/21) 1 yr ago (07/19/21) 1 yr ago (07/01/21) 2 yr ago (05/05/20) 4 yr ago (01/30/19) 4 yr ago (11/11/18)  Glucose 70 - 99 mg/dL 086 High  578 High  CM 125  High  CM 95 CM 139 High  R 91 107 High   BUN 6 - 24 mg/dL 9 7 R 12 R 7 R 10  7 R  Creatinine, Ser 0.57 - 1.00 mg/dL 4.69 6.29 R 5.28 R 4.13 R 0.71  0.67 R  eGFR >59 mL/min/1.73 106        BUN/Creatinine Ratio 9 - 23 13    14     Sodium 134 - 144 mmol/L 140 139 R 136 R 139 R 141 142 R 132 Low  R  Potassium 3.5 - 5.2 mmol/L 3.9 3.3 Low  R 3.2 Low  R 3.5 R 3.6 3.5 R 3.6 R  Chloride 96 - 106 mmol/L 105 105 R 104 R 107 R 108 High   98 R  CO2 20 - 29 mmol/L 19 Low  24 R 24 R 22 R   25 R  Calcium 8.7 - 10.2 mg/dL 9.0 9.7 R 9.0 R 9.6 R 9.3  8.9 R  Total Protein 6.0 - 8.5 g/dL 6.6  6.9 R 8.6 High  R 7.0  8.2 High  R  Albumin 3.9 - 4.9 g/dL  3.8 Low   3.6 R 4.5 R 4.1 R  3.4 Low  R  Globulin, Total 1.5 - 4.5 g/dL 2.8    2.9    Albumin/Globulin Ratio 1.2 - 2.2 1.4    1.4    Bilirubin Total 0.0 - 1.2 mg/dL 0.4  0.7 R 1.5 High  R 0.3  1.2 R  Alkaline Phosphatase 44 - 121 IU/L 80  68 R 67 R 94 R  195 High  R  AST 0 - 40 IU/L 26  19 R 21 R 39  40 R  ALT 0 - 32 IU/L 32  22 R 22 R   45 High  R  Resulting Agency [facility]             Assessment & Plan   PSORIASIS, Pustular/Drug induced ( Most likely due to Humira )   Exam: Well-demarcated erythematous papules/plaques with silvery scale, pustular papules papules. 5% BSA.    Flared   Psoriasis is a chronic non-curable, but treatable genetic/hereditary disease that may have other systemic features affecting other organ systems such as joints (Psoriatic Arthritis). It is associated with an increased risk of inflammatory bowel disease, heart disease, non-alcoholic fatty liver disease, and depression.  Treatments include light and laser treatments; topical medications; and systemic medications including oral and injectables.  Treatment Plan: - TNF inhibitors are known to cause paradoxical psoriasis eruptions.  Interestingly, after the psoriasis developed, pt was changed  from one TNF inhibitor (Influximab) to another TNF inhibitor (Humira) which explains why her psoriasis continues to worsen.  The challenge is that the Humira is helping the patient's breathing (which is compromised by her Sarcoidosis) considerably so I don't want to abruptly stop her injections.  We will attempt to get better control of the psoriasis with Soriatane (Acetrin)    -Recent labs were reviewed and LFT's, Lipid panel and CBC were WNL  -Acetritin 25 mg once daily with food (will increase as tolerated and if needed to 75mg  daily).  Will recheck labs in 6 weeks -Discussed possible side effects of medication(dry lips, dry skin, no ETOH, no unprotected sex / pregnancies) -Tacrolimus ointment to be applied twice daily to areas around mouth -Samples given of Vytama to be applied once daily to areas on the palms and soles. Mix with Vaseline. -Patient Deferred biopsy today.     Return in about 6 weeks (around 06/07/2023) for Psoriasis Follow UP.  Jaclynn Guarneri, CMA, am acting as scribe for Langston Reusing, MD.   Documentation: I have reviewed the above documentation for accuracy and completeness, and I agree with the above.  Langston Reusing, MD

## 2023-05-02 ENCOUNTER — Ambulatory Visit (INDEPENDENT_AMBULATORY_CARE_PROVIDER_SITE_OTHER): Payer: 59 | Admitting: Nurse Practitioner

## 2023-05-02 ENCOUNTER — Encounter: Payer: Self-pay | Admitting: Nurse Practitioner

## 2023-05-02 VITALS — BP 125/84 | HR 97 | Temp 97.2°F | Wt 235.6 lb

## 2023-05-02 DIAGNOSIS — M25512 Pain in left shoulder: Secondary | ICD-10-CM | POA: Insufficient documentation

## 2023-05-02 MED ORDER — KETOROLAC TROMETHAMINE 30 MG/ML IJ SOLN
30.0000 mg | Freq: Once | INTRAMUSCULAR | Status: AC
  Administered 2023-05-02: 30 mg via INTRAMUSCULAR

## 2023-05-02 MED ORDER — METHYLPREDNISOLONE SODIUM SUCC 40 MG IJ SOLR: 40.0000 mg | Freq: Once | INTRAMUSCULAR | Status: DC

## 2023-05-02 MED ORDER — PREDNISONE 20 MG PO TABS
20.0000 mg | ORAL_TABLET | Freq: Every day | ORAL | 0 refills | Status: AC
Start: 2023-05-02 — End: 2023-05-07

## 2023-05-02 MED ORDER — PREDNISONE 20 MG PO TABS: 20.0000 mg | ORAL_TABLET | Freq: Every day | ORAL | 0 refills | Status: DC

## 2023-05-02 NOTE — Patient Instructions (Addendum)
1. Acute pain of left shoulder  - ketorolac (TORADOL) 30 MG/ML injection 30 mg - methylPREDNISolone sodium succinate (SOLU-MEDROL) 40 mg/mL injection 40 mg - predniSONE (DELTASONE) 20 MG tablet; Take 1 tablet (20 mg total) by mouth daily with breakfast for 5 days.  Dispense: 5 tablet; Refill: 0 - DG Shoulder Left  Follow up:  Follow up as needed

## 2023-05-02 NOTE — Assessment & Plan Note (Signed)
-   ketorolac (TORADOL) 30 MG/ML injection 30 mg - methylPREDNISolone sodium succinate (SOLU-MEDROL) 40 mg/mL injection 40 mg - predniSONE (DELTASONE) 20 MG tablet; Take 1 tablet (20 mg total) by mouth daily with breakfast for 5 days.  Dispense: 5 tablet; Refill: 0 - DG Shoulder Left  Follow up:  Follow up as needed

## 2023-05-02 NOTE — Progress Notes (Signed)
@Patient  ID: Rhonda Hester, female    DOB: 15-Sep-1976, 47 y.o.   MRN: [id number]  Chief Complaint  Patient presents with   Arm Pain    Left no known injury    Referring provider: Ivonne Andrew, NP   HPI  47 y.o. female with past medical history of sarcoidosis, GERD, asthma, obesity   Patient presents today for left shoulder pain.  She states the pain is radiating down into her left arm.  She states that she has been having this pain for the past 2 months but it has progressively worsened over the past couple days.  She has decreased range of motion to her left shoulder.  She cannot completely raise her arm.  We discussed that we will get an x-ray.  We will give patient Toradol injection today in office and we will order prednisone.  Patient does have a history of sarcoidosis.  Patient is managed by pain management for chronic pain. Denies f/c/s, n/v/d, hemoptysis, PND, leg swelling Denies chest pain or edema       Allergies  Allergen Reactions   Acyclovir And Related Other (See Comments)    Patient states it "made me feel like I can't breath"    Bee Pollen Itching and Other (See Comments)     coughing   Citric Acid Other (See Comments)    AVOIDS DUE TO INTERSTITIAL CYSTITIS   Dust Mite Extract Itching and Other (See Comments)     coughing   Grapeseed Extract [Nutritional Supplements] Other (See Comments)    Throat itching and burning   Pineapple Itching    Burning in mouth and tingling lips   Pollen Extract Itching and Other (See Comments)     coughing   Nickel Rash    Immunization History  Administered Date(s) Administered   Dtap, Unspecified 03/25/1977, 06/15/1977, 12/02/1977, 04/05/1979, 05/14/1981   HPV 9-valent 11/15/2019   Influenza Whole 09/02/2011, 09/26/2012   Influenza,inj,Quad PF,6+ Mos 12/27/2012, 09/04/2015, 09/20/2017, 08/10/2018, 08/27/2019, 11/20/2019   Influenza-Unspecified 12/27/2012, 09/04/2015, 09/20/2017, 08/10/2018   Measles  04/29/1978   Mumps 04/29/1978   Pneumococcal Conjugate-13 11/14/2018   Pneumococcal Polysaccharide-23 05/10/2016   Polio, Unspecified 03/25/1977, 06/15/1977, 12/02/1977, 04/29/1978, 04/05/1979, 05/14/1981   Rubella 04/29/1978   Tdap 05/10/2016    Past Medical History:  Diagnosis Date   Allergic asthma    Allergy    SEASONAL   Anxiety    Breast mass 05/2017   lt breast lump   Eczema    Fibroid    GERD (gastroesophageal reflux disease)    History of anal fissures    History of Bell's palsy    2006  RIGHT SIDE--  RESOLVED   History of gastroesophageal reflux (GERD)    History of kidney stones    Hyperlipidemia    Interstitial cystitis    Psoriasis    Sarcoidosis of lung (HCC)    DX 2006--  PULMOLOGIST--  DR WUJW- on 2 liters of oxygen, goes to pain clinic   Seasonal allergic rhinitis    Shortness of breath    Vaginal Pap smear, abnormal     Tobacco History: Social History   Tobacco Use  Smoking Status Some Days   Packs/day: 0.30   Years: 6.00   Additional pack years: 0.00   Total pack years: 1.80   Types: Cigars, Cigarettes   Last attempt to quit: 11/2016   Years since quitting: 6.4  Smokeless Tobacco Never  Tobacco Comments   "Smokes 1 black and mild occasionally"  Ready to quit: Not Answered Counseling given: Not Answered Tobacco comments: "Smokes 1 black and mild occasionally"   Outpatient Encounter Medications as of 05/02/2023  Medication Sig   [DISCONTINUED] predniSONE (DELTASONE) 20 MG tablet Take 1 tablet (20 mg total) by mouth daily with breakfast for 5 days.   acitretin (SORIATANE) 25 MG capsule Take 1 capsule (25 mg total) by mouth daily. Take one pill with food daily   Adalimumab 40 MG/0.8ML PNKT Inject into the skin.   aluminum-magnesium hydroxide-simethicone (MAALOX) 200-200-20 MG/5ML SUSP Take 15 mLs by mouth 3 (three) times daily as needed.   DULERA 200-5 MCG/ACT AERO Inhale 2 puffs into the lungs 2 (two) times daily.   ibuprofen (ADVIL) 800  MG tablet Take 1 tablet (800 mg total) by mouth every 8 (eight) hours as needed (pain).   oxyCODONE (ROXICODONE) 5 MG immediate release tablet Take 1 tablet (5 mg total) by mouth every 6 (six) hours as needed for up to 6 doses for severe pain or breakthrough pain.   pantoprazole (PROTONIX) 40 MG tablet Take 1 tablet (40 mg total) by mouth daily for 14 days.   predniSONE (DELTASONE) 20 MG tablet Take 1 tablet (20 mg total) by mouth daily with breakfast for 5 days.   pregabalin (LYRICA) 25 MG capsule Take 25 mg by mouth 3 (three) times daily.   tacrolimus (PROTOPIC) 0.1 % ointment Apply topically 2 (two) times daily. Apply twice daily to affected areas on face   [DISCONTINUED] famotidine (PEPCID) 20 MG tablet Take 1 tablet (20 mg total) by mouth 2 (two) times daily.   Facility-Administered Encounter Medications as of 05/02/2023  Medication   bupivacaine (MARCAINE) 0.5 % 15 mL, phenazopyridine (PYRIDIUM) 400 mg bladder mixture   ketorolac (TORADOL) 30 MG/ML injection 30 mg   methylPREDNISolone sodium succinate (SOLU-MEDROL) 40 mg/mL injection 40 mg     Review of Systems  Review of Systems  Constitutional: Negative.   HENT: Negative.    Cardiovascular: Negative.   Gastrointestinal: Negative.   Musculoskeletal:        Left shoulder pain  Allergic/Immunologic: Negative.   Neurological: Negative.   Psychiatric/Behavioral: Negative.         Physical Exam  BP 125/84   Pulse 97   Temp (!) 97.2 F (36.2 C)   Wt 235 lb 9.6 oz (106.9 kg)   SpO2 99%   BMI 36.90 kg/m   Wt Readings from Last 5 Encounters:  05/02/23 235 lb 9.6 oz (106.9 kg)  03/24/23 232 lb 9.6 oz (105.5 kg)  02/22/23 234 lb (106.1 kg)  02/21/23 236 lb (107 kg)  01/20/23 230 lb (104.3 kg)     Physical Exam Vitals and nursing note reviewed.  Constitutional:      General: She is not in acute distress.    Appearance: She is well-developed.  Cardiovascular:     Rate and Rhythm: Normal rate and regular rhythm.   Pulmonary:     Effort: Pulmonary effort is normal.     Breath sounds: Normal breath sounds.  Musculoskeletal:     Right shoulder: Normal.     Left shoulder: Tenderness present. No swelling. Decreased range of motion.       Arms:  Neurological:     Mental Status: She is alert and oriented to person, place, and time.      Lab Results:  CBC    Component Value Date/Time   WBC 7.6 01/20/2023 1107   WBC 7.3 09/22/2021 2051   RBC 4.49 01/20/2023 1107  RBC 4.57 09/22/2021 2051   HGB 13.2 01/20/2023 1107   HCT 40.5 01/20/2023 1107   PLT 301 01/20/2023 1107   MCV 90 01/20/2023 1107   MCH 29.4 01/20/2023 1107   MCH 28.7 09/22/2021 2051   MCHC 32.6 01/20/2023 1107   MCHC 33.0 09/22/2021 2051   RDW 12.4 01/20/2023 1107   LYMPHSABS 2.2 07/19/2021 1519   LYMPHSABS 1.1 09/01/2018 1116   MONOABS 0.4 07/19/2021 1519   EOSABS 0.3 07/19/2021 1519   EOSABS 0.6 (H) 09/01/2018 1116   BASOSABS 0.0 07/19/2021 1519   BASOSABS 0.0 09/01/2018 1116    BMET    Component Value Date/Time   NA 140 01/20/2023 1107   K 3.9 01/20/2023 1107   CL 105 01/20/2023 1107   CO2 19 (L) 01/20/2023 1107   GLUCOSE 220 (H) 01/20/2023 1107   GLUCOSE 115 (H) 09/22/2021 2051   BUN 9 01/20/2023 1107   CREATININE 0.71 01/20/2023 1107   CREATININE 0.65 08/09/2017 1018   CALCIUM 9.0 01/20/2023 1107   GFRNONAA >60 09/22/2021 2051   GFRNONAA >89 08/09/2017 1018   GFRAA 121 05/05/2020 1059   GFRAA >89 08/09/2017 1018    BNP No results found for: "BNP"  ProBNP    Component Value Date/Time   PROBNP 10.0 03/31/2018 1229    Imaging: US Abdomen Limited RUQ (LIVER/GB)  Result Date: 04/19/2023 CLINICAL DATA:  Hepatitis EXAM: ULTRASOUND ABDOMEN LIMITED RIGHT UPPER QUADRANT COMPARISON:  CT renal stone 06/29/2021 FINDINGS: Gallbladder: No gallstones or wall thickening visualized. No sonographic Murphy sign noted by sonographer. Common bile duct: Diameter: 3.1 mm Liver: Increased echogenicity. No focal lesion.  Portal vein is patent on color Doppler imaging with normal direction of blood flow towards the liver. Other: None. IMPRESSION: 1. Increased hepatic parenchymal echogenicity suggestive of steatosis. 2. No cholelithiasis or sonographic evidence for acute cholecystitis. Electronically Signed   By: Annia Belt M.D.   On: 04/19/2023 11:55     Assessment & Plan:   Acute pain of left shoulder - ketorolac (TORADOL) 30 MG/ML injection 30 mg - methylPREDNISolone sodium succinate (SOLU-MEDROL) 40 mg/mL injection 40 mg - predniSONE (DELTASONE) 20 MG tablet; Take 1 tablet (20 mg total) by mouth daily with breakfast for 5 days.  Dispense: 5 tablet; Refill: 0 - DG Shoulder Left  Follow up:  Follow up as needed     Ivonne Andrew, NP 05/02/2023

## 2023-05-03 ENCOUNTER — Ambulatory Visit (HOSPITAL_COMMUNITY)
Admission: RE | Admit: 2023-05-03 | Discharge: 2023-05-03 | Disposition: A | Discharge status: Home / Self Care | Payer: 59 | Source: Ambulatory Visit | Attending: Nurse Practitioner | Admitting: Nurse Practitioner

## 2023-05-03 DIAGNOSIS — M25512 Pain in left shoulder: Secondary | ICD-10-CM | POA: Insufficient documentation

## 2023-05-03 NOTE — Addendum Note (Signed)
Addended by: Renelda Loma on: 05/03/2023 07:20 AM   Modules accepted: Orders

## 2023-05-09 NOTE — Progress Notes (Signed)
Pt has seen in my chart. Gh 

## 2023-06-07 ENCOUNTER — Ambulatory Visit: Payer: 59 | Admitting: Dermatology

## 2023-06-08 ENCOUNTER — Ambulatory Visit: Payer: 59 | Admitting: Registered"

## 2023-06-13 ENCOUNTER — Encounter: Payer: Self-pay | Admitting: Nurse Practitioner

## 2023-06-13 ENCOUNTER — Ambulatory Visit: Payer: 59 | Admitting: Physician Assistant

## 2023-06-13 ENCOUNTER — Ambulatory Visit (INDEPENDENT_AMBULATORY_CARE_PROVIDER_SITE_OTHER): Payer: 59 | Admitting: Nurse Practitioner

## 2023-06-13 VITALS — BP 123/87 | HR 85 | Temp 97.5°F | Wt 235.0 lb

## 2023-06-13 DIAGNOSIS — Z309 Encounter for contraceptive management, unspecified: Secondary | ICD-10-CM

## 2023-06-13 DIAGNOSIS — M25512 Pain in left shoulder: Secondary | ICD-10-CM | POA: Diagnosis not present

## 2023-06-13 MED ORDER — MEDROXYPROGESTERONE ACETATE 150 MG/ML IM SUSP
150.0000 mg | Freq: Once | INTRAMUSCULAR | Status: AC
Start: 2023-06-13 — End: 2023-06-13
  Administered 2023-06-13: 150 mg via INTRAMUSCULAR

## 2023-06-13 MED ORDER — KETOROLAC TROMETHAMINE 30 MG/ML IJ SOLN
30.0000 mg | Freq: Once | INTRAMUSCULAR | Status: AC
Start: 2023-06-13 — End: 2023-06-13
  Administered 2023-06-13: 30 mg via INTRAMUSCULAR

## 2023-06-13 NOTE — Progress Notes (Signed)
@Patient  ID: Rhonda Hester, female    DOB: March 06, 1976, 47 y.o.   MRN: [id number]  Chief Complaint  Patient presents with   Contraception   Shoulder Pain    Left     Referring provider: Ivonne Andrew, NP   HPI  Patient was originally on her schedule today for Depo shot through nurse visit but was complaining of left shoulder pain when arriving to the office.  She was placed on the schedule today.  She was seen about a month ago for this issue.  And she has been to the emergency room on vacation for this.  She was given a round of prednisone but states that this did not help.  Patient did have x-rays that showed no acute abnormality.  Patient states that she cannot raise her left arm and is having pain and swelling to her shoulder joint.  We will place urgent referral to orthopedics. Denies f/c/s, n/v/d, hemoptysis, PND, leg swelling Denies chest pain or edema       Allergies  Allergen Reactions   Acyclovir And Related Other (See Comments)    Patient states it "made me feel like I can't breath"    Bee Pollen Itching and Other (See Comments)     coughing   Citric Acid Other (See Comments)    AVOIDS DUE TO INTERSTITIAL CYSTITIS   Dust Mite Extract Itching and Other (See Comments)     coughing   Grapeseed Extract [Nutritional Supplements] Other (See Comments)    Throat itching and burning   Pineapple Itching    Burning in mouth and tingling lips   Pollen Extract Itching and Other (See Comments)     coughing   Nickel Rash    Immunization History  Administered Date(s) Administered   Dtap, Unspecified 03/25/1977, 06/15/1977, 12/02/1977, 04/05/1979, 05/14/1981   HPV 9-valent 11/15/2019   Influenza Whole 09/02/2011, 09/26/2012   Influenza,inj,Quad PF,6+ Mos 12/27/2012, 09/04/2015, 09/20/2017, 08/10/2018, 08/27/2019, 11/20/2019   Influenza-Unspecified 12/27/2012, 09/04/2015, 09/20/2017, 08/10/2018   Measles 04/29/1978   Mumps 04/29/1978   Pneumococcal Conjugate-13  11/14/2018   Pneumococcal Polysaccharide-23 05/10/2016   Polio, Unspecified 03/25/1977, 06/15/1977, 12/02/1977, 04/29/1978, 04/05/1979, 05/14/1981   Rubella 04/29/1978   Tdap 05/10/2016    Past Medical History:  Diagnosis Date   Allergic asthma    Allergy    SEASONAL   Anxiety    Breast mass 05/2017   lt breast lump   Eczema    Fibroid    GERD (gastroesophageal reflux disease)    History of anal fissures    History of Bell's palsy    2006  RIGHT SIDE--  RESOLVED   History of gastroesophageal reflux (GERD)    History of kidney stones    Hyperlipidemia    Interstitial cystitis    Psoriasis    Sarcoidosis of lung (HCC)    DX 2006--  PULMOLOGIST--  DR NWGN- on 2 liters of oxygen, goes to pain clinic   Seasonal allergic rhinitis    Shortness of breath    Vaginal Pap smear, abnormal     Tobacco History: Social History   Tobacco Use  Smoking Status Some Days   Packs/day: 0.30   Years: 6.00   Additional pack years: 0.00   Total pack years: 1.80   Types: Cigars, Cigarettes   Last attempt to quit: 11/2016   Years since quitting: 6.5  Smokeless Tobacco Never  Tobacco Comments   "Smokes 1 black and mild occasionally"   Ready to quit: Not Answered Counseling  given: Not Answered Tobacco comments: "Smokes 1 black and mild occasionally"   Outpatient Encounter Medications as of 06/13/2023  Medication Sig   acitretin (SORIATANE) 25 MG capsule Take 1 capsule (25 mg total) by mouth daily. Take one pill with food daily   Adalimumab 40 MG/0.8ML PNKT Inject into the skin.   aluminum-magnesium hydroxide-simethicone (MAALOX) 200-200-20 MG/5ML SUSP Take 15 mLs by mouth 3 (three) times daily as needed.   DULERA 200-5 MCG/ACT AERO Inhale 2 puffs into the lungs 2 (two) times daily.   ibuprofen (ADVIL) 800 MG tablet Take 1 tablet (800 mg total) by mouth every 8 (eight) hours as needed (pain).   oxyCODONE (ROXICODONE) 5 MG immediate release tablet Take 1 tablet (5 mg total) by mouth  every 6 (six) hours as needed for up to 6 doses for severe pain or breakthrough pain.   pantoprazole (PROTONIX) 40 MG tablet Take 1 tablet (40 mg total) by mouth daily for 14 days.   pregabalin (LYRICA) 25 MG capsule Take 25 mg by mouth 3 (three) times daily.   tacrolimus (PROTOPIC) 0.1 % ointment Apply topically 2 (two) times daily. Apply twice daily to affected areas on face   [DISCONTINUED] famotidine (PEPCID) 20 MG tablet Take 1 tablet (20 mg total) by mouth 2 (two) times daily.   Facility-Administered Encounter Medications as of 06/13/2023  Medication   bupivacaine (MARCAINE) 0.5 % 15 mL, phenazopyridine (PYRIDIUM) 400 mg bladder mixture   ketorolac (TORADOL) 30 MG/ML injection 30 mg   medroxyPROGESTERone (DEPO-PROVERA) injection 150 mg     Review of Systems  Review of Systems  Constitutional: Negative.   HENT: Negative.    Cardiovascular: Negative.   Gastrointestinal: Negative.   Musculoskeletal:        Left shoulder pain and decreased mobility  Allergic/Immunologic: Negative.   Neurological: Negative.   Psychiatric/Behavioral: Negative.         Physical Exam  BP 123/87   Pulse 85   Temp (!) 97.5 F (36.4 C)   Wt 235 lb (106.6 kg)   SpO2 99%   BMI 36.81 kg/m   Wt Readings from Last 5 Encounters:  06/13/23 235 lb (106.6 kg)  05/02/23 235 lb 9.6 oz (106.9 kg)  03/24/23 232 lb 9.6 oz (105.5 kg)  02/22/23 234 lb (106.1 kg)  02/21/23 236 lb (107 kg)     Physical Exam Vitals and nursing note reviewed.  Constitutional:      General: She is not in acute distress.    Appearance: She is well-developed.  Cardiovascular:     Rate and Rhythm: Normal rate and regular rhythm.  Pulmonary:     Effort: Pulmonary effort is normal.     Breath sounds: Normal breath sounds.  Musculoskeletal:     Right shoulder: Normal.     Left shoulder: Swelling and tenderness present. Decreased range of motion.       Arms:  Neurological:     Mental Status: She is alert and oriented  to person, place, and time.      Lab Results:  CBC    Component Value Date/Time   WBC 7.6 01/20/2023 1107   WBC 7.3 09/22/2021 2051   RBC 4.49 01/20/2023 1107   RBC 4.57 09/22/2021 2051   HGB 13.2 01/20/2023 1107   HCT 40.5 01/20/2023 1107   PLT 301 01/20/2023 1107   MCV 90 01/20/2023 1107   MCH 29.4 01/20/2023 1107   MCH 28.7 09/22/2021 2051   MCHC 32.6 01/20/2023 1107   MCHC 33.0 09/22/2021  2051   RDW 12.4 01/20/2023 1107   LYMPHSABS 2.2 07/19/2021 1519   LYMPHSABS 1.1 09/01/2018 1116   MONOABS 0.4 07/19/2021 1519   EOSABS 0.3 07/19/2021 1519   EOSABS 0.6 (H) 09/01/2018 1116   BASOSABS 0.0 07/19/2021 1519   BASOSABS 0.0 09/01/2018 1116    BMET    Component Value Date/Time   NA 140 01/20/2023 1107   K 3.9 01/20/2023 1107   CL 105 01/20/2023 1107   CO2 19 (L) 01/20/2023 1107   GLUCOSE 220 (H) 01/20/2023 1107   GLUCOSE 115 (H) 09/22/2021 2051   BUN 9 01/20/2023 1107   CREATININE 0.71 01/20/2023 1107   CREATININE 0.65 08/09/2017 1018   CALCIUM 9.0 01/20/2023 1107   GFRNONAA >60 09/22/2021 2051   GFRNONAA >89 08/09/2017 1018   GFRAA 121 05/05/2020 1059   GFRAA >89 08/09/2017 1018    BNP No results found for: "BNP"  ProBNP    Component Value Date/Time   PROBNP 10.0 03/31/2018 1229    Imaging: No results found.   Assessment & Plan:   Acute pain of left shoulder - Ambulatory referral to Orthopedics - ketorolac (TORADOL) 30 MG/ML injection 30 mg  2. Encounter for contraceptive management, unspecified type  - medroxyPROGESTERone (DEPO-PROVERA) injection 150 mg   Follow up:  Follow up as scheduled     Ivonne Andrew, NP 06/13/2023

## 2023-06-13 NOTE — Assessment & Plan Note (Signed)
-   Ambulatory referral to Orthopedics - ketorolac (TORADOL) 30 MG/ML injection 30 mg  2. Encounter for contraceptive management, unspecified type  - medroxyPROGESTERone (DEPO-PROVERA) injection 150 mg   Follow up:  Follow up as scheduled

## 2023-06-13 NOTE — Patient Instructions (Signed)
1. Acute pain of left shoulder  - Ambulatory referral to Orthopedics - ketorolac (TORADOL) 30 MG/ML injection 30 mg  2. Encounter for contraceptive management, unspecified type  - medroxyPROGESTERone (DEPO-PROVERA) injection 150 mg   Follow up:  Follow up as scheduled

## 2023-06-15 ENCOUNTER — Ambulatory Visit (INDEPENDENT_AMBULATORY_CARE_PROVIDER_SITE_OTHER): Payer: 59 | Admitting: Orthopaedic Surgery

## 2023-06-15 DIAGNOSIS — G8929 Other chronic pain: Secondary | ICD-10-CM

## 2023-06-15 DIAGNOSIS — M25512 Pain in left shoulder: Secondary | ICD-10-CM

## 2023-06-15 NOTE — Progress Notes (Signed)
Office Visit Note   Patient: Rhonda Hester           Date of Birth: [birth year]           MRN: [id number] Visit Date: 06/15/2023              Requested by: Ivonne Andrew, NP [phone] [address] [place],  [place] [address] PCP: Ivonne Andrew, NP   Assessment & Plan: Visit Diagnoses:  1. Chronic left shoulder pain     Plan: Impression 47 year old female with left shoulder adhesive capsulitis.  Disease process explained and treatment options were reviewed.  We will have her make an appointment with Dr. Shon Baton for intra-articular steroid injection.  I made a referral to outpatient PT.  She can follow-up with Dr. Shon Baton after the first injection to determine the timing of the second 1.  Follow-Up Instructions: No follow-ups on file.   Orders:  No orders of the defined types were placed in this encounter.  No orders of the defined types were placed in this encounter.     Procedures: No procedures performed   Clinical Data: No additional findings.   Subjective: Chief Complaint  Patient presents with   Left Shoulder - Pain    HPI Patient is a very pleasant 47 year old female with sarcoidosis who comes in for evaluation of 3 months of left shoulder pain without any injuries or recent changes in activity.  Feels pain throughout the shoulder.  Denies any radicular symptoms.  Recently had x-rays of the shoulder done at the PCPs office which were negative. Review of Systems  Constitutional: Negative.   HENT: Negative.    Eyes: Negative.   Respiratory: Negative.    Cardiovascular: Negative.   Endocrine: Negative.   Musculoskeletal: Negative.   Neurological: Negative.   Hematological: Negative.   Psychiatric/Behavioral: Negative.    All other systems reviewed and are negative.    Objective: Vital Signs: There were no vitals taken for this visit.  Physical Exam Vitals and nursing note reviewed.  Constitutional:      Appearance: She is well-developed.   HENT:     Head: Atraumatic.     Nose: Nose normal.  Eyes:     Extraocular Movements: Extraocular movements intact.  Cardiovascular:     Pulses: Normal pulses.  Pulmonary:     Effort: Pulmonary effort is normal.  Abdominal:     Palpations: Abdomen is soft.  Musculoskeletal:     Cervical back: Neck supple.  Skin:    General: Skin is warm.     Capillary Refill: Capillary refill takes less than 2 seconds.  Neurological:     Mental Status: She is alert. Mental status is at baseline.  Psychiatric:        Behavior: Behavior normal.        Thought Content: Thought content normal.        Judgment: Judgment normal.     Ortho Exam Examination of the left shoulder shows guarding and pain and decreased range of motion in all planes.  Manual muscle testing of the rotator cuff is normal.   Specialty Comments:  No specialty comments available.  Imaging: No results found.   PMFS History: Patient Active Problem List   Diagnosis Date Noted   Acute pain of left shoulder 05/02/2023   Impaired fasting blood sugar 02/23/2023   Intermittent chest pain 01/20/2023   Bronchitis 10/18/2022   Rash 07/22/2022   ASCUS with positive high risk HPV cervical 08/13/2021  Moderate episode of recurrent major depressive disorder (HCC) 05/05/2020   Dysmenorrhea 04/23/2020   Dysplasia of cervix, low grade (CIN 1) 03/23/2020   HPV (human papilloma virus) infection 03/23/2020   Abnormal Pap smear of cervix 11/12/2019   Chronic back pain 10/03/2019   Eczema 10/03/2019   GERD (gastroesophageal reflux disease) 10/03/2019   Hyperlipidemia, unspecified 10/03/2019   Iron deficiency anemia, unspecified 10/03/2019   Mild intermittent asthma without complication 10/03/2019   Obesity (BMI 30-39.9) 10/03/2019   Tobacco user 10/03/2019   Chronic pain syndrome 10/03/2019   Sarcoidosis of lung (HCC) 08/22/2018   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   Enlarged submental lymph node 03/30/2018    Nodule of soft tissue 03/22/2018   Hemorrhoids 02/13/2018   TMJ (temporomandibular joint disorder) 12/23/2017   Acute pain of right shoulder 11/26/2017   Chronic bilateral low back pain without sciatica 08/01/2017   Chronic pain of right knee 08/01/2017   Depression 04/02/2017   Pelvic pain 03/28/2017   Obesity (BMI 30.0-34.9) 03/19/2017   Generalized pain 03/19/2017   Metabolic syndrome 03/19/2017   Medication management 03/19/2017   Tobacco abuse 01/24/2017   Uterine fibroid 01/17/2017   Chronic interstitial cystitis 01/17/2017   Menorrhagia 12/06/2016   Acne vulgaris 05/10/2016   BMI 31.0-31.9,adult 05/10/2016   Weight gain 05/10/2016   Right hip pain 10/27/2014   Cervical adenopathy 03/12/2014   History of smoking 08/07/2013   Allergic asthma 07/22/2011   Past Medical History:  Diagnosis Date   Allergic asthma    Allergy    SEASONAL   Anxiety    Breast mass 05/2017   lt breast lump   Eczema    Fibroid    GERD (gastroesophageal reflux disease)    History of anal fissures    History of Bell's palsy    2006  RIGHT SIDE--  RESOLVED   History of gastroesophageal reflux (GERD)    History of kidney stones    Hyperlipidemia    Interstitial cystitis    Psoriasis    Sarcoidosis of lung (HCC)    DX 2006--  PULMOLOGIST--  DR ZOXW- on 2 liters of oxygen, goes to pain clinic   Seasonal allergic rhinitis    Shortness of breath    Vaginal Pap smear, abnormal     Family History  Problem Relation Age of Onset   Brain cancer Maternal Grandmother    Hyperlipidemia Other    Sleep apnea Other    Depression Maternal Aunt    Seizures Maternal Uncle    Colon cancer Neg Hx    Stomach cancer Neg Hx     Past Surgical History:  Procedure Laterality Date   CYSTO WITH HYDRODISTENSION N/A 08/24/2013   Procedure: CYSTOSCOPY/HYDRODISTENSION with instillation of marcaine and pyridium;  Surgeon: [name], MD;  [facility];  Service: Urology;   Laterality: N/A;   CYSTOSCOPY/ HYDRODISTENTION/ BLADDER BX  06-09-2010   CYSTOSCOPY/URETEROSCOPY/HOLMIUM LASER/STENT PLACEMENT Right 01/30/2019   Procedure: CYSTOSCOPY/RETROGRADE/URETEROSCOPY/HOLMIUM LASER/STENT PLACEMENT;  Surgeon: Jerilee Field, MD;  Location: [facility];  Service: Urology;  Laterality: Right;   DILATION AND CURETTAGE OF UTERUS  1997   EXTRACORPOREAL SHOCK WAVE LITHOTRIPSY Right 11/30/2018   Procedure: EXTRACORPOREAL SHOCK WAVE LITHOTRIPSY (ESWL);  Surgeon: Alfredo Martinez, MD;  Location: [facility];  Service: Urology;  Laterality: Right;   IR TRANSCATHETER BX  06/14/2018   IR US GUIDE VASC ACCESS RIGHT  06/14/2018   IR VENOGRAM HEPATIC W HEMODYNAMIC EVALUATION  06/14/2018   Social History  Occupational History   Occupation: disabled  Tobacco Use   Smoking status: Some Days    Packs/day: 0.30    Years: 6.00    Additional pack years: 0.00    Total pack years: 1.80    Types: Cigars, Cigarettes    Last attempt to quit: 11/2016    Years since quitting: 6.5   Smokeless tobacco: Never   Tobacco comments:    "Smokes 1 black and mild occasionally"  Vaping Use   Vaping Use: Never used  Substance and Sexual Activity   Alcohol use: No    Alcohol/week: 0.0 standard drinks of alcohol   Drug use: No    Comment: HX MARIJUANA USE   Sexual activity: Not Currently    Birth control/protection: Injection    Comment: abstinence

## 2023-06-22 ENCOUNTER — Encounter: Payer: Self-pay | Admitting: Dermatology

## 2023-06-22 ENCOUNTER — Ambulatory Visit (INDEPENDENT_AMBULATORY_CARE_PROVIDER_SITE_OTHER): Payer: 59 | Admitting: Dermatology

## 2023-06-22 VITALS — BP 111/70

## 2023-06-22 DIAGNOSIS — M25519 Pain in unspecified shoulder: Secondary | ICD-10-CM | POA: Diagnosis not present

## 2023-06-22 DIAGNOSIS — L409 Psoriasis, unspecified: Secondary | ICD-10-CM

## 2023-06-22 DIAGNOSIS — L710 Perioral dermatitis: Secondary | ICD-10-CM

## 2023-06-22 DIAGNOSIS — D869 Sarcoidosis, unspecified: Secondary | ICD-10-CM

## 2023-06-22 DIAGNOSIS — L309 Dermatitis, unspecified: Secondary | ICD-10-CM

## 2023-06-22 MED ORDER — TACROLIMUS 0.1 % EX OINT: TOPICAL_OINTMENT | Freq: Two times a day (BID) | CUTANEOUS | 2 refills | Status: DC

## 2023-06-22 MED ORDER — CLOBETASOL PROPIONATE 0.05 % EX OINT: 1.0000 | TOPICAL_OINTMENT | Freq: Two times a day (BID) | CUTANEOUS | 1 refills | Status: DC

## 2023-06-22 NOTE — Patient Instructions (Addendum)
Thank you for visiting [place] again. We appreciate your dedication to managing your health and are committed to supporting you in improving your skin condition.  Here is a summary of the key instructions from today's consultation:  - Medications and Treatments:   - Continue using Humira as prescribed for your sarcoidosis.   - Discontinue Acitretin due to your shoulder pain and recent prescription of Cyclobenzaprine (muscle relaxer).   - Begin using Clobetasol ointment twice daily to your hands and feet for two weeks to manage psoriasis.   - After two weeks of Clobetasol, switch to Tacrolimus ointment twice daily for two weeks. Continue alternating between these two treatments.   - Apply Tacrolimus to the affected areas on your face BID until clear  - Follow-Up Care:   - Please ensure to follow up with orthopedic care for your shoulder as planned.   - We will revisit the possibility of reintroducing Acitretin once your shoulder issue is resolved.  - Pharmacy Issues:   - There was a noted issue with the pharmacy not providing Tacrolimus. Ensure to contact us if there are any problems with receiving your medications.  - Next Appointment:   - Schedule a follow-up in two months to assess the effectiveness of the new treatment regimen and discuss any adjustments needed.  - Additional Notes:   - If you experience any side effects or have concerns about the treatments, please contact our office immediately.  We hope to see improvements with these adjustments. Please do not hesitate to reach out if you have any questions or need further assistance.        Due to recent changes in healthcare laws, you may see results of your pathology and/or laboratory studies on MyChart before the doctors have had a chance to review them. We understand that in some cases there may be results that are confusing or concerning to you. Please understand that not all results are received at the same time and often the  doctors may need to interpret multiple results in order to provide you with the best plan of care or course of treatment. Therefore, we ask that you please give [place] 2 business days to thoroughly review all your results before contacting the office for clarification. Should we see a critical lab result, you will be contacted sooner.   If You Need Anything After Your Visit  If you have any questions or concerns for your doctor, please call our main line at [phone] If no one answers, please leave a voicemail as directed and we will return your call as soon as possible. Messages left after 4 pm will be answered the following business day.   You may also send [place] a message via MyChart. We typically respond to MyChart messages within 1-2 business days.  For prescription refills, please ask your pharmacy to contact our office. Our fax number is [phone].  If you have an urgent issue when the clinic is closed that cannot wait until the next business day, you can page your doctor at the number below.    Please note that while we do our best to be available for urgent issues outside of office hours, we are not available 24/7.   If you have an urgent issue and are unable to reach [place], you may choose to seek medical care at your doctor's office, retail clinic, urgent care center, or emergency room.  If you have a medical emergency, please immediately call 911 or go to the emergency department.  In the event of inclement weather, please call our main line at [phone] for an update on the status of any delays or closures.  Dermatology Medication Tips: Please keep the boxes that topical medications come in in order to help keep track of the instructions about where and how to use these. Pharmacies typically print the medication instructions only on the boxes and not directly on the medication tubes.   If your medication is too expensive, please contact our office at [phone] or send [place] a message  through MyChart.   We are unable to tell what your co-pay for medications will be in advance as this is different depending on your insurance coverage. However, we may be able to find a substitute medication at lower cost or fill out paperwork to get insurance to cover a needed medication.   If a prior authorization is required to get your medication covered by your insurance company, please allow [place] 1-2 business days to complete this process.  Drug prices often vary depending on where the prescription is filled and some pharmacies may offer cheaper prices.  The website www.goodrx.com contains coupons for medications through different pharmacies. The prices here do not account for what the cost may be with help from insurance (it may be cheaper with your insurance), but the website can give you the price if you did not use any insurance.  - You can print the associated coupon and take it with your prescription to the pharmacy.  - You may also stop by our office during regular business hours and pick up a GoodRx coupon card.  - If you need your prescription sent electronically to a different pharmacy, notify our office through [facility] or by phone at [phone]

## 2023-06-22 NOTE — Progress Notes (Unsigned)
Follow Up Visit   Subjective  Rhonda Hester is a 47 y.o. female who presents for the following: Rash On hands, feet, and upper back, bilateral corners of mouth. She states her feet are the worst areas and it can be a 7 on an itch scale. She is using samples of Vtama once daily on her hands and feet. Her left hand may be better.  She is on Humira for Sarcoidosis. She tried the Acitretin 25mg  for a half a month but stopped due to having shoulder pain, starting a muscle relaxer, and is waiting to restart it until shoulder pain improves.  She is using OTC Hydrocortisone 1% cream on her face when needed. She says the pharmacy did not give her Tacrolimus cream. I called the pharmacy that claims a PA was needed and Insurance requires step therapy. There is nothing chart or on cover my meds indicating a PA was needed.  The following portions of the chart were reviewed this encounter and updated as appropriate: medications, allergies, medical history  Review of Systems:  No other skin or systemic complaints except as noted in HPI or Assessment and Plan.  Objective  Well appearing patient in no apparent distress; mood and affect are within normal limits.  - Hands: Hyperkeratotic plaques on the hypothenar eminences with scaling, slight improvement on the left hand.   - Feet: Persistent hyperkeratotic plaques with pustules, described as itchy and peeling.   - Mouth and Jawline: Hyperpigmentation with confluence of papules, not itchy but requires constant moisturization to prevent dryness.                  A focused examination was performed of the following areas: Feet, hands  Relevant exam findings are noted in the Assessment and Plan.   Assessment & Plan   PSORIASIS, Pustular/Drug induced ( Most likely due to Prior use of remicaid )   Exam: Well-demarcated erythematous papules/plaques with silvery scale, pustular papules papules. 5% BSA.  Hyperkeratotic plaques involving  the hypothenar Hyperkeratotic plaques and pustules involving the bilateral corners of the mouth    Flared    Treatment Plan: - TNF inhibitors are known to cause paradoxical psoriasis eruptions.  Interestingly, after the psoriasis developed, pt was changed from one TNF inhibitor (Influximab) to another TNF inhibitor (Humira) which explains why her psoriasis continues to worsen.  The challenge is that the Humira is helping the patient's breathing (which is compromised by her Sarcoidosis) considerably so I don't want to abruptly stop her injections.     1. Psoriasis: - Patient has been using V-TAMA cream samples with minimal improvement in her hands and feet. Hyperkeratotic plaques are present on the hypothenar eminences and left inner sole, with some scaling and pustules. - Discontinue V-TAMA cream due to insufficient improvement. - Prescribe clobetasol ointment to be applied twice a day for two weeks. - Prescribe tacrolimus to be applied twice a day for two weeks, alternating with clobetasol ointment. - Reevaluate in two months.  2. Peri-Oral Dermatitis (? facial psoriasis): - Hyperpigmentation with confluence of papules present on bilateral corners of the mouth and jawline. - Prescribe tacrolimus for the mouth area. Instruct the patient to call the clinic if they do not receive the prescription. - Reevaluate in two months.  3. Sarcoidosis: - Patient is currently on Humira. Continue Humira as prescribed.  4. Acitretin trial: - Patient took Acitretin for two weeks but stopped due to shoulder pain and concerns about taking it with Cyclobenzaprine. - Hold off  on Acitretin for now and focus on topical treatments. - Reevaluate the need for Acitretin in two months, once the shoulder issue is resolved.  5. Shoulder pain: - Patient has an appointment with orthopedic care for a possible Kenalog injection. - Encourage the patient to follow up with orthopedic care and monitor the shoulder pain.       Return in about 2 months (around 08/22/2023) for Psoriasis Follow UP.  Jaclynn Guarneri, CMA, am acting as scribe for [employer], DO.   Documentation: I have reviewed the above documentation for accuracy and completeness, and I agree with the above.  Langston Reusing, DO

## 2023-06-23 ENCOUNTER — Other Ambulatory Visit: Payer: Self-pay

## 2023-06-23 ENCOUNTER — Encounter: Payer: Self-pay | Admitting: Sports Medicine

## 2023-06-23 ENCOUNTER — Ambulatory Visit (INDEPENDENT_AMBULATORY_CARE_PROVIDER_SITE_OTHER): Payer: 59 | Admitting: Sports Medicine

## 2023-06-23 DIAGNOSIS — G8929 Other chronic pain: Secondary | ICD-10-CM

## 2023-06-23 DIAGNOSIS — M7502 Adhesive capsulitis of left shoulder: Secondary | ICD-10-CM | POA: Diagnosis not present

## 2023-06-23 DIAGNOSIS — M25512 Pain in left shoulder: Secondary | ICD-10-CM

## 2023-06-23 MED ORDER — METHYLPREDNISOLONE ACETATE 80 MG/ML IJ SUSP
80.0000 mg | INTRAMUSCULAR | Status: AC | PRN
Start: 2023-06-23 — End: 2023-06-23
  Administered 2023-06-23: 80 mg via INTRA_ARTICULAR

## 2023-06-23 MED ORDER — BUPIVACAINE HCL 0.25 % IJ SOLN
4.0000 mL | INTRAMUSCULAR | Status: AC | PRN
Start: 2023-06-23 — End: 2023-06-23
  Administered 2023-06-23: 4 mL via INTRA_ARTICULAR

## 2023-06-23 MED ORDER — LIDOCAINE HCL 1 % IJ SOLN
3.0000 mL | INTRAMUSCULAR | Status: AC | PRN
Start: 2023-06-23 — End: 2023-06-23
  Administered 2023-06-23: 3 mL

## 2023-06-23 NOTE — Progress Notes (Addendum)
   Procedure Note  Patient: Rhonda Hester             Date of Birth: January 13, 1976           MRN: [id number]             Visit Date: 06/23/2023  Procedures: Visit Diagnoses:  1. Chronic left shoulder pain   2. Adhesive capsulitis of left shoulder    Large Joint Inj: L glenohumeral on 06/23/2023 1:52 PM Indications: pain Details: 22 G 3.5 in needle, ultrasound-guided posterior approach Medications: 3 mL lidocaine 1 %; 4 mL bupivacaine 0.25 %; 80 mg methylPREDNISolone acetate 80 MG/ML Outcome: tolerated well, no immediate complications  US-guided glenohumeral joint injection, left shoulder After discussion on risks/benefits/indications, informed verbal consent was obtained. A timeout was then performed. The patient was positioned lying lateral recumbent on examination table. The patient's shoulder was prepped with betadine and multiple alcohol swabs and utilizing ultrasound guidance, the patient's glenohumeral joint was identified on ultrasound. Using ultrasound guidance a 22-gauge, 3.5 inch needle with a mixture of 3:4:1 cc's lidocaine:bupivicaine:depomedrol was directed from a lateral to medial direction via in-plane technique into the glenohumeral joint with visualization of appropriate spread of injectate into the joint. Patient tolerated the procedure well without immediate complications.      Procedure, treatment alternatives, risks and benefits explained, specific risks discussed. Consent was given by the patient. Immediately prior to procedure a time out was called to verify the correct patient, procedure, equipment, support staff and site/side marked as required. Patient was prepped and draped in the usual sterile fashion.    - patient had quite a bit of pain with the injection - we evaluated the patient about 5 minutes post-injection and had no adverse effects - we discussed seeing her back in 2 weeks for reassessment and consideration of repeat injection, but she did not want  to do this given the pain with today's injection, which is understanding --> she will notify Dr. Roda Hester and/or myself in 2 weeks how she is doing - discussed importance of PT and HEP for frozen shoulder  Rhonda Brunner, DO Primary Care Sports Medicine Physician  [facility] - Orthoped[facility]  This note was dictated using Dragon naturally speaking software and may contain errors in syntax, spelling, or content which have not been identified prior to signing this note.

## 2023-06-27 ENCOUNTER — Other Ambulatory Visit: Payer: Self-pay

## 2023-06-27 DIAGNOSIS — L309 Dermatitis, unspecified: Secondary | ICD-10-CM

## 2023-06-27 MED ORDER — PIMECROLIMUS 1 % EX CREA
TOPICAL_CREAM | Freq: Two times a day (BID) | CUTANEOUS | 3 refills | Status: DC
Start: 2023-06-27 — End: 2023-11-16

## 2023-07-06 NOTE — Therapy (Signed)
OUTPATIENT PHYSICAL THERAPY EVALUATION   Patient Name: Rhonda Hester MRN: [id number] DOB:March 15, 1976, 47 y.o., female Today's Date: 07/07/2023   END OF SESSION:  PT End of Session - 07/07/23 1113     Visit Number 1   arrived no charge            Past Medical History:  Diagnosis Date   Allergic asthma    Allergy    SEASONAL   Anxiety    Breast mass 05/2017   lt breast lump   Eczema    Fibroid    GERD (gastroesophageal reflux disease)    History of anal fissures    History of Bell's palsy    2006  RIGHT SIDE--  RESOLVED   History of gastroesophageal reflux (GERD)    History of kidney stones    Hyperlipidemia    Interstitial cystitis    Psoriasis    Sarcoidosis of lung (HCC)    DX 2006--  PULMOLOGIST--  DR [facility]- on 2 liters of oxygen, goes to pain clinic   Seasonal allergic rhinitis    Shortness of breath    Vaginal Pap smear, abnormal    Past Surgical History:  Procedure Laterality Date   CYSTO WITH HYDRODISTENSION N/A 08/24/2013   Procedure: CYSTOSCOPY/HYDRODISTENSION with instillation of marcaine and pyridium;  Surgeon: [name], MD;  Location: [facility];  Service: Urology;  Laterality: N/A;   CYSTOSCOPY/ HYDRODISTENTION/ BLADDER BX  06-09-2010   CYSTOSCOPY/URETEROSCOPY/HOLMIUM LASER/STENT PLACEMENT Right 01/30/2019   Procedure: CYSTOSCOPY/RETROGRADE/URETEROSCOPY/HOLMIUM LASER/STENT PLACEMENT;  Surgeon: Jerilee Field, MD;  Location: [facility];  Service: Urology;  Laterality: Right;   DILATION AND CURETTAGE OF UTERUS  1997   EXTRACORPOREAL SHOCK WAVE LITHOTRIPSY Right 11/30/2018   Procedure: EXTRACORPOREAL SHOCK WAVE LITHOTRIPSY (ESWL);  Surgeon: Alfredo Martinez, MD;  Location: [facility];  Service: Urology;  Laterality: Right;   IR TRANSCATHETER BX  06/14/2018   IR US GUIDE VASC ACCESS RIGHT  06/14/2018   IR VENOGRAM HEPATIC W HEMODYNAMIC EVALUATION  06/14/2018   Patient Active Problem List   Diagnosis Date Noted   Acute pain of  left shoulder 05/02/2023   Impaired fasting blood sugar 02/23/2023   Intermittent chest pain 01/20/2023   Bronchitis 10/18/2022   Rash 07/22/2022   ASCUS with positive high risk HPV cervical 08/13/2021   Moderate episode of recurrent major depressive disorder (HCC) 05/05/2020   Dysmenorrhea 04/23/2020   Dysplasia of cervix, low grade (CIN 1) 03/23/2020   HPV (human papilloma virus) infection 03/23/2020   Abnormal Pap smear of cervix 11/12/2019   Chronic back pain 10/03/2019   Eczema 10/03/2019   GERD (gastroesophageal reflux disease) 10/03/2019   Hyperlipidemia, unspecified 10/03/2019   Iron deficiency anemia, unspecified 10/03/2019   Mild intermittent asthma without complication 10/03/2019   Obesity (BMI 30-39.9) 10/03/2019   Tobacco user 10/03/2019   Chronic pain syndrome 10/03/2019   Sarcoidosis of lung (HCC) 08/22/2018   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   Enlarged submental lymph node 03/30/2018   Nodule of soft tissue 03/22/2018   Hemorrhoids 02/13/2018   TMJ (temporomandibular joint disorder) 12/23/2017   Acute pain of right shoulder 11/26/2017   Chronic bilateral low back pain without sciatica 08/01/2017   Chronic pain of right knee 08/01/2017   Depression 04/02/2017   Pelvic pain 03/28/2017   Obesity (BMI 30.0-34.9) 03/19/2017   Generalized pain 03/19/2017   Metabolic syndrome 03/19/2017   Medication management 03/19/2017   Tobacco abuse 01/24/2017   Uterine fibroid 01/17/2017   Chronic  interstitial cystitis 01/17/2017   Menorrhagia 12/06/2016   Acne vulgaris 05/10/2016   BMI 31.0-31.9,adult 05/10/2016   Weight gain 05/10/2016   Right hip pain 10/27/2014   Cervical adenopathy 03/12/2014   History of smoking 08/07/2013   Allergic asthma 07/22/2011    PCP: Ivonne Andrew, NP  REFERRING PROVIDER: Tarry Kos, MD  REFERRING DIAG: Chronic left shoulder pain  THERAPY DIAG:  Chronic left shoulder pain  Rationale for Evaluation and  Treatment: Rehabilitation   Patient arrived to evaluation and reported no pain or limitation regarding her left shoulder since she received an injection on 06/23/2023. Patient also expressed anxiety related to be around other people at the PT clinic, so she elected to no proceed with the PT evaluation. She was instructed to follow-up with her referring provided and she would need a new PT referral for her left shoulder if she were to need PT services in the future. Patient expressed understanding.   Rosana Hoes, PT, DPT, LAT, ATC 07/07/23  11:14 AM Phone: [phone] Fax: [phone]

## 2023-07-07 ENCOUNTER — Ambulatory Visit: Payer: 59 | Attending: Orthopaedic Surgery | Admitting: Physical Therapy

## 2023-07-07 DIAGNOSIS — M25512 Pain in left shoulder: Secondary | ICD-10-CM | POA: Insufficient documentation

## 2023-07-07 DIAGNOSIS — G8929 Other chronic pain: Secondary | ICD-10-CM | POA: Insufficient documentation

## 2023-07-21 ENCOUNTER — Encounter: Payer: Self-pay | Admitting: Nurse Practitioner

## 2023-07-21 ENCOUNTER — Ambulatory Visit (INDEPENDENT_AMBULATORY_CARE_PROVIDER_SITE_OTHER): Payer: 59 | Admitting: Nurse Practitioner

## 2023-07-21 VITALS — BP 122/70 | HR 89 | Temp 97.2°F | Wt 245.0 lb

## 2023-07-21 DIAGNOSIS — R2231 Localized swelling, mass and lump, right upper limb: Secondary | ICD-10-CM

## 2023-07-21 MED ORDER — AMOXICILLIN-POT CLAVULANATE 875-125 MG PO TABS
1.0000 | ORAL_TABLET | Freq: Two times a day (BID) | ORAL | 0 refills | Status: DC
Start: 2023-07-21 — End: 2023-10-26

## 2023-07-21 NOTE — Assessment & Plan Note (Signed)
-   [place] AXILLA RIGHT  - amoxicillin-clavulanate (AUGMENTIN) 875-125 MG tablet; Take 1 tablet by mouth 2 (two) times daily.  Dispense: 20 tablet; Refill: 0   Follow up:  Follow up as needed

## 2023-07-21 NOTE — Progress Notes (Signed)
@Patient  ID: Rhonda Hester, female    DOB: [birth year], 47 y.o.   MRN: [id number]  Chief Complaint  Patient presents with   Follow-up   Mass    Under right arm     Referring provider: Ivonne Andrew, NP   HPI  47 y.o. female with past medical history of sarcoidosis, GERD, asthma, obesity   Patient presents today for an acute visit.  She states that she has noticed over the last few days that she has a mass to her right axilla area.  There is not erythematous or warm to the touch.  She states it is not tender.  States that she has been having to use her over the past few days.  We discussed that this could be an enlarged lymph node due to URI type symptoms.  We will cover with antibiotic and order ultrasound to right axilla. Denies f/c/s, n/v/d, hemoptysis, PND, leg swelling Denies chest pain or edema     Allergies  Allergen Reactions   Acyclovir And Related Other (See Comments)    Patient states it "made me feel like I can't breath"    Bee Pollen Itching and Other (See Comments)     coughing   Citric Acid Other (See Comments)    AVOIDS DUE TO INTERSTITIAL CYSTITIS   Dust Mite Extract Itching and Other (See Comments)     coughing   Grapeseed Extract [Nutritional Supplements] Other (See Comments)    Throat itching and burning   Pineapple Itching    Burning in mouth and tingling lips   Pollen Extract Itching and Other (See Comments)     coughing   Nickel Rash    Immunization History  Administered Date(s) Administered   Dtap, Unspecified 03/25/1977, 06/15/1977, 12/02/1977, 04/05/1979, 05/14/1981   HPV 9-valent 11/15/2019   Influenza Whole 09/02/2011, 09/26/2012   Influenza,inj,Quad PF,6+ Mos 12/27/2012, 09/04/2015, 09/20/2017, 08/10/2018, 08/27/2019, 11/20/2019   Influenza-Unspecified 12/27/2012, 09/04/2015, 09/20/2017, 08/10/2018   Measles 04/29/1978   Mumps 04/29/1978   Pneumococcal Conjugate-13 11/14/2018   Pneumococcal Polysaccharide-23 05/10/2016    Polio, Unspecified 03/25/1977, 06/15/1977, 12/02/1977, 04/29/1978, 04/05/1979, 05/14/1981   Rubella 04/29/1978   Tdap 05/10/2016    Past Medical History:  Diagnosis Date   Allergic asthma    Allergy    SEASONAL   Anxiety    Breast mass 05/2017   lt breast lump   Eczema    Fibroid    GERD (gastroesophageal reflux disease)    History of anal fissures    History of Bell's palsy    2006  RIGHT SIDE--  RESOLVED   History of gastroesophageal reflux (GERD)    History of kidney stones    Hyperlipidemia    Interstitial cystitis    Psoriasis    Sarcoidosis of lung (HCC)    DX 2006--  PULMOLOGIST--  DR [facility]UJ[facility]- on 2 liters of oxygen, goes to pain clinic   Seasonal allergic rhinitis    Shortness of breath    Vaginal Pap smear, abnormal     Tobacco History: Social History   Tobacco Use  Smoking Status Some Days   Current packs/day: 0.00   Average packs/day: 0.3 packs/day for 6.0 years (1.8 ttl pk-yrs)   Types: Cigars, Cigarettes   Start date: 11/2010   Last attempt to quit: 11/2016   Years since quitting: 6.6  Smokeless Tobacco Never  Tobacco Comments   "Smokes 1 black and mild occasionally"   Ready to quit: Not Answered Counseling given: Not Answered Tobacco comments: "Smokes  1 black and mild occasionally"   Outpatient Encounter Medications as of 07/21/2023  Medication Sig   Adalimumab 40 MG/0.8ML PNKT Inject into the skin.   amoxicillin-clavulanate (AUGMENTIN) 875-125 MG tablet Take 1 tablet by mouth 2 (two) times daily.   clobetasol ointment (TEMOVATE) 0.05 % Apply 1 Application topically 2 (two) times daily. Apply to AA on hands and feet twice daily for 2 weeks then stop. Alternate with Tacrolimus ointment for 2 weeks.   DULERA 200-5 MCG/ACT AERO Inhale 2 puffs into the lungs 2 (two) times daily.   ibuprofen (ADVIL) 800 MG tablet Take 1 tablet (800 mg total) by mouth every 8 (eight) hours as needed (pain).   oxyCODONE (ROXICODONE) 5 MG immediate release tablet Take 1  tablet (5 mg total) by mouth every 6 (six) hours as needed for up to 6 doses for severe pain or breakthrough pain.   pimecrolimus (ELIDEL) 1 % cream Apply topically 2 (two) times daily.   pregabalin (LYRICA) 25 MG capsule Take 25 mg by mouth 3 (three) times daily.   aluminum-magnesium hydroxide-simethicone (MAALOX) 200-200-20 MG/5ML SUSP Take 15 mLs by mouth 3 (three) times daily as needed. (Patient not taking: Reported on 07/21/2023)   pantoprazole (PROTONIX) 40 MG tablet Take 1 tablet (40 mg total) by mouth daily for 14 days. (Patient not taking: Reported on 07/21/2023)   [DISCONTINUED] famotidine (PEPCID) 20 MG tablet Take 1 tablet (20 mg total) by mouth 2 (two) times daily.   Facility-Administered Encounter Medications as of 07/21/2023  Medication   bupivacaine (MARCAINE) 0.5 % 15 mL, phenazopyridine (PYRIDIUM) 400 mg bladder mixture     Review of Systems  Review of Systems  Constitutional: Negative.   HENT: Negative.    Cardiovascular: Negative.   Gastrointestinal: Negative.   Skin:        Right axilla mass  Allergic/Immunologic: Negative.   Neurological: Negative.   Psychiatric/Behavioral: Negative.         Physical Exam  BP 122/70   Pulse 89   Temp (!) 97.2 F (36.2 C)   Wt 245 lb (111.1 kg)   SpO2 99%   BMI 38.37 kg/m   Wt Readings from Last 5 Encounters:  07/21/23 245 lb (111.1 kg)  06/13/23 235 lb (106.6 kg)  05/02/23 235 lb 9.6 oz (106.9 kg)  03/24/23 232 lb 9.6 oz (105.5 kg)  02/22/23 234 lb (106.1 kg)     Physical Exam Vitals and nursing note reviewed.  Constitutional:      General: She is not in acute distress.    Appearance: She is well-developed.  Cardiovascular:     Rate and Rhythm: Normal rate and regular rhythm.  Pulmonary:     Effort: Pulmonary effort is normal.     Breath sounds: Normal breath sounds.  Skin:         Comments: Mass to right axilla noted  Neurological:     Mental Status: She is alert and oriented to person, place, and  time.      Lab Results:  CBC    Component Value Date/Time   WBC 7.6 01/20/2023 1107   WBC 7.3 09/22/2021 2051   RBC 4.49 01/20/2023 1107   RBC 4.57 09/22/2021 2051   HGB 13.2 01/20/2023 1107   HCT 40.5 01/20/2023 1107   PLT 301 01/20/2023 1107   MCV 90 01/20/2023 1107   MCH 29.4 01/20/2023 1107   MCH 28.7 09/22/2021 2051   MCHC 32.6 01/20/2023 1107   MCHC 33.0 09/22/2021 2051   RDW 12.4  01/20/2023 1107   LYMPHSABS 2.2 07/19/2021 1519   LYMPHSABS 1.1 09/01/2018 1116   MONOABS 0.4 07/19/2021 1519   EOSABS 0.3 07/19/2021 1519   EOSABS 0.6 (H) 09/01/2018 1116   BASOSABS 0.0 07/19/2021 1519   BASOSABS 0.0 09/01/2018 1116    BMET    Component Value Date/Time   NA 140 01/20/2023 1107   K 3.9 01/20/2023 1107   CL 105 01/20/2023 1107   CO2 19 (L) 01/20/2023 1107   GLUCOSE 220 (H) 01/20/2023 1107   GLUCOSE 115 (H) 09/22/2021 2051   BUN 9 01/20/2023 1107   CREATININE 0.71 01/20/2023 1107   CREATININE 0.65 08/09/2017 1018   CALCIUM 9.0 01/20/2023 1107   GFRNONAA >60 09/22/2021 2051   GFRNONAA >89 08/09/2017 1018   GFRAA 121 05/05/2020 1059   GFRAA >89 08/09/2017 1018     Assessment & Plan:   Mass of right axilla - [place] AXILLA RIGHT  - amoxicillin-clavulanate (AUGMENTIN) 875-125 MG tablet; Take 1 tablet by mouth 2 (two) times daily.  Dispense: 20 tablet; Refill: 0   Follow up:  Follow up as needed     Ivonne Andrew, NP 07/21/2023

## 2023-07-21 NOTE — Patient Instructions (Addendum)
1. Mass of right axilla  - [place] AXILLA RIGHT  - amoxicillin-clavulanate (AUGMENTIN) 875-125 MG tablet; Take 1 tablet by mouth 2 (two) times daily.  Dispense: 20 tablet; Refill: 0   Follow up:  Follow up as needed

## 2023-08-09 ENCOUNTER — Other Ambulatory Visit: Payer: Self-pay | Admitting: Nurse Practitioner

## 2023-08-09 DIAGNOSIS — R2231 Localized swelling, mass and lump, right upper limb: Secondary | ICD-10-CM

## 2023-08-10 ENCOUNTER — Telehealth: Payer: Self-pay

## 2023-08-10 ENCOUNTER — Telehealth: Payer: Self-pay | Admitting: Sports Medicine

## 2023-08-10 NOTE — Telephone Encounter (Signed)
Patient called. Says she is in a lot of pain.  Nothing is helping her pain. Would like to know what she should do?

## 2023-08-10 NOTE — Telephone Encounter (Signed)
Error

## 2023-08-15 ENCOUNTER — Ambulatory Visit (INDEPENDENT_AMBULATORY_CARE_PROVIDER_SITE_OTHER): Payer: 59 | Admitting: Sports Medicine

## 2023-08-15 ENCOUNTER — Encounter: Payer: Self-pay | Admitting: Sports Medicine

## 2023-08-15 DIAGNOSIS — M25512 Pain in left shoulder: Secondary | ICD-10-CM

## 2023-08-15 DIAGNOSIS — M7502 Adhesive capsulitis of left shoulder: Secondary | ICD-10-CM

## 2023-08-15 DIAGNOSIS — G8929 Other chronic pain: Secondary | ICD-10-CM

## 2023-08-15 NOTE — Progress Notes (Signed)
She said she didn't get any relief from the injection she is unable to get comfortable in the bed due to constant pain.

## 2023-08-15 NOTE — Progress Notes (Signed)
Rhonda GOBLIRSCH - 47 y.o. female MRN [id number]  Date of birth: August 29, 1976  Office Visit Note: Visit Date: 08/15/2023 PCP: Ivonne Andrew, NP Referred by: Ivonne Andrew, NP  Subjective: Chief Complaint  Patient presents with   Left Shoulder - Pain   HPI: Rhonda Hester is a pleasant 47 y.o. female who presents today for acute on chronic left shoulder pain with previously diagnosed frozen shoulder.   Had US-guided GHJ injection on 06/23/23 - but this was very painful for her. Difficulty with that injection. She does note she had improvement in her pain, because weeks after the injection when she was seen at PT, the shoulder was not bothering her at all. Over last few weeks has been worse - pain is bad that it is keeping her up at night.   She does follow with pain management but states she is on a lot of medications for this and none of them are super helpful.  Had PT eval on 07/07/23, but per chart review had anxiety related to being around other people at PT clinic, so did not due evaluation.   She is a newer-onset DM.  Lab Results  Component Value Date   HGBA1C 6.7 (A) 02/21/2023   History of sarcoidosis.   Pertinent ROS were reviewed with the patient and found to be negative unless otherwise specified above in HPI.   Assessment & Plan: Visit Diagnoses:  1. Adhesive capsulitis of left shoulder   2. Chronic left shoulder pain    Plan: Discussed with Tisha for nature of her acute on chronic left shoulder pain.  Her picture does fairly the diagnosis of adhesive capsulitis, I did discuss the nature of this with her today.  She did have a lot of pain with prior ultrasound-guided glenohumeral joint injection but does state that this certainly helped improve her pain for period of time.  She unfortunately is unable to do formalized physical therapy given her anxiety around social settings.  Given this, I did print out a customized adhesive capsulitis home rehab program and  did review these with her in the room today.  She is to start these once daily.  Advised that he is okay to push past some of the pain to work on improving her range of motion.  Given the severity of her pain, I do think it is pertinent to obtain an MRI of the shoulder to ensure we are not dealing with any rotator cuff or other pathology that is also existing with her adhesive capsulitis.  We will follow-up 1 week after MRI to discuss next steps.  I did discuss her A1c of 6.7 which would give her the diagnosis of diabetes, but she tells me today that she is not diabetic.  Advised that she follow-up with her primary care physician for this.  Follow-up: Return in about 1 week (around 08/22/2023) for F/u 1 week after MRI with Dr. Roda Shutters or Shon Baton.   Meds & Orders: No orders of the defined types were placed in this encounter.   Orders Placed This Encounter  Procedures   MR Shoulder Left w/o contrast     Procedures: No procedures performed      Clinical History: No specialty comments available.  She reports that she has been smoking cigars and cigarettes. She started smoking about 12 years ago. She has a 1.8 pack-year smoking history. She has never used smokeless tobacco.  Recent Labs    02/21/23 1038  HGBA1C 6.7*  Objective:   Vital Signs: There were no vitals taken for this visit.  Physical Exam  Gen: Well-appearing, in no acute distress; non-toxic CV:  Well-perfused. Warm.  Resp: Breathing unlabored on room air; no wheezing. Psych: Fluid speech in conversation; appropriate affect; normal thought process Neuro: Sensation intact throughout. No gross coordination deficits.   Ortho Exam - Left shoulder: There is generalized tenderness to palpation throughout the shoulder.  No redness or warmth to the shoulder joint.  There is improved active and passive range of motion with forward flexion to approximately 175 degrees, abduction 145 degrees, there is some limitation with external rotation  to about 50 degrees compared to 60 degrees of the contralateral arm, internal rotation is limited with thumb to L5 compared to T12 on the contralateral arm.  There is pain with resisted abduction and equivocal drop arm test.  Imaging:  DG Shoulder Left CLINICAL DATA:  left shoulder pain  EXAM: LEFT SHOULDER - 2+ VIEW  COMPARISON:  None Available.  FINDINGS: No acute fracture or dislocation. Joint spaces and alignment are maintained. No area of erosion or osseous destruction. No unexpected radiopaque foreign body. Soft tissues are unremarkable.  IMPRESSION: No acute fracture or dislocation.  Electronically Signed   By: Meda Klinefelter M.D.   On: 05/07/2023 14:02    Past Medical/Family/Surgical/Social History: Medications & Allergies reviewed per EMR, new medications updated. Patient Active Problem List   Diagnosis Date Noted   Mass of right axilla 07/21/2023   Acute pain of left shoulder 05/02/2023   Impaired fasting blood sugar 02/23/2023   Intermittent chest pain 01/20/2023   Bronchitis 10/18/2022   Rash 07/22/2022   ASCUS with positive high risk HPV cervical 08/13/2021   Moderate episode of recurrent major depressive disorder (HCC) 05/05/2020   Dysmenorrhea 04/23/2020   Dysplasia of cervix, low grade (CIN 1) 03/23/2020   HPV (human papilloma virus) infection 03/23/2020   Abnormal Pap smear of cervix 11/12/2019   Chronic back pain 10/03/2019   Eczema 10/03/2019   GERD (gastroesophageal reflux disease) 10/03/2019   Hyperlipidemia, unspecified 10/03/2019   Iron deficiency anemia, unspecified 10/03/2019   Mild intermittent asthma without complication 10/03/2019   Obesity (BMI 30-39.9) 10/03/2019   Tobacco user 10/03/2019   Chronic pain syndrome 10/03/2019   Sarcoidosis of lung (HCC) 08/22/2018   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   Enlarged submental lymph node 03/30/2018   Nodule of soft tissue 03/22/2018   Hemorrhoids 02/13/2018   TMJ  (temporomandibular joint disorder) 12/23/2017   Acute pain of right shoulder 11/26/2017   Chronic bilateral low back pain without sciatica 08/01/2017   Chronic pain of right knee 08/01/2017   Depression 04/02/2017   Pelvic pain 03/28/2017   Obesity (BMI 30.0-34.9) 03/19/2017   Generalized pain 03/19/2017   Metabolic syndrome 03/19/2017   Medication management 03/19/2017   Tobacco abuse 01/24/2017   Uterine fibroid 01/17/2017   Chronic interstitial cystitis 01/17/2017   Menorrhagia 12/06/2016   Acne vulgaris 05/10/2016   BMI 31.0-31.9,adult 05/10/2016   Weight gain 05/10/2016   Right hip pain 10/27/2014   Cervical adenopathy 03/12/2014   History of smoking 08/07/2013   Allergic asthma 07/22/2011   Past Medical History:  Diagnosis Date   Allergic asthma    Allergy    SEASONAL   Anxiety    Breast mass 05/2017   lt breast lump   Eczema    Fibroid    GERD (gastroesophageal reflux disease)    History of anal fissures  History of Bell's palsy    2006  RIGHT SIDE--  RESOLVED   History of gastroesophageal reflux (GERD)    History of kidney stones    Hyperlipidemia    Interstitial cystitis    Psoriasis    Sarcoidosis of lung (HCC)    DX 2006--  PULMOLOGIST--  DR OZHY- on 2 liters of oxygen, goes to pain clinic   Seasonal allergic rhinitis    Shortness of breath    Vaginal Pap smear, abnormal    Family History  Problem Relation Age of Onset   Brain cancer Maternal Grandmother    Hyperlipidemia Other    Sleep apnea Other    Depression Maternal Aunt    Seizures Maternal Uncle    Colon cancer Neg Hx    Stomach cancer Neg Hx    Past Surgical History:  Procedure Laterality Date   CYSTO WITH HYDRODISTENSION N/A 08/24/2013   Procedure: CYSTOSCOPY/HYDRODISTENSION with instillation of marcaine and pyridium;  Surgeon: [name], MD;  Location: [facility];  Service: Urology;  Laterality: N/A;   CYSTOSCOPY/ HYDRODISTENTION/ BLADDER BX  06-09-2010    CYSTOSCOPY/URETEROSCOPY/HOLMIUM LASER/STENT PLACEMENT Right 01/30/2019   Procedure: CYSTOSCOPY/RETROGRADE/URETEROSCOPY/HOLMIUM LASER/STENT PLACEMENT;  Surgeon: Jerilee Field, MD;  Location: [facility];  Service: Urology;  Laterality: Right;   DILATION AND CURETTAGE OF UTERUS  1997   EXTRACORPOREAL SHOCK WAVE LITHOTRIPSY Right 11/30/2018   Procedure: EXTRACORPOREAL SHOCK WAVE LITHOTRIPSY (ESWL);  Surgeon: Alfredo Martinez, MD;  Location: [facility];  Service: Urology;  Laterality: Right;   IR TRANSCATHETER BX  06/14/2018   IR US GUIDE VASC ACCESS RIGHT  06/14/2018   IR VENOGRAM HEPATIC W HEMODYNAMIC EVALUATION  06/14/2018   Social History   Occupational History   Occupation: disabled  Tobacco Use   Smoking status: Some Days    Current packs/day: 0.00    Average packs/day: 0.3 packs/day for 6.0 years (1.8 ttl pk-yrs)    Types: Cigars, Cigarettes    Start date: 11/2010    Last attempt to quit: 11/2016    Years since quitting: 6.7   Smokeless tobacco: Never   Tobacco comments:    "Smokes 1 black and mild occasionally"  Vaping Use   Vaping status: Never Used  Substance and Sexual Activity   Alcohol use: No    Alcohol/week: 0.0 standard drinks of alcohol   Drug use: No    Comment: HX MARIJUANA USE   Sexual activity: Not Currently    Birth control/protection: Injection    Comment: abstinence    I spent 35 minutes in the care of the patient today including face-to-face time, preparation to see the patient, as well as review of previous note from Dr. Roda Shutters, discussion on importance of glucose control, nonoperative management, A1c and previous blood work review, discussion on the need for additional imaging for the above diagnoses.   Patient was instructed in 10 minutes of therapeutic exercises for left shoulder to improve strength, ROM and function according to my instructions and plan of care during the office visit. A customized handout was provided and demonstration of proper technique shown  and discussed. Patient did perform some of exercises and demonstrate understanding through teachback.  All questions discussed and answered.  [name], DO Primary Care Sports Medicine Physician  [facility]  This note was dictated using Dragon naturally speaking software and may contain errors in syntax, spelling, or content which have not been identified prior to signing this note.

## 2023-08-18 ENCOUNTER — Telehealth: Payer: Self-pay

## 2023-08-18 ENCOUNTER — Encounter: Payer: Self-pay | Admitting: Dermatology

## 2023-08-18 ENCOUNTER — Ambulatory Visit (INDEPENDENT_AMBULATORY_CARE_PROVIDER_SITE_OTHER): Payer: 59 | Admitting: Dermatology

## 2023-08-18 VITALS — BP 143/92

## 2023-08-18 DIAGNOSIS — L403 Pustulosis palmaris et plantaris: Secondary | ICD-10-CM

## 2023-08-18 DIAGNOSIS — D869 Sarcoidosis, unspecified: Secondary | ICD-10-CM

## 2023-08-18 DIAGNOSIS — L409 Psoriasis, unspecified: Secondary | ICD-10-CM

## 2023-08-18 DIAGNOSIS — L710 Perioral dermatitis: Secondary | ICD-10-CM | POA: Diagnosis not present

## 2023-08-18 DIAGNOSIS — L299 Pruritus, unspecified: Secondary | ICD-10-CM

## 2023-08-18 MED ORDER — HYDROXYZINE HCL 25 MG PO TABS: 25.0000 mg | ORAL_TABLET | Freq: Every evening | ORAL | 3 refills | Status: DC

## 2023-08-18 MED ORDER — OTEZLA 30 MG PO TABS: 30.0000 mg | ORAL_TABLET | Freq: Two times a day (BID) | ORAL | 3 refills | Status: DC

## 2023-08-18 MED ORDER — CLOBETASOL PROPIONATE 0.05 % EX OINT: 1.0000 | TOPICAL_OINTMENT | Freq: Two times a day (BID) | CUTANEOUS | 2 refills | Status: DC

## 2023-08-18 NOTE — Telephone Encounter (Signed)
Spoke with patient about the samples that were supposed to be provided while in office. I explained she was supposed to received multiple boxes so she would be able to continue therapy until insurance approves the maintenance dose. Pt voiced frustration because she is has a way to drive to get to our office and she has also been in doctor's appts all week and would like to rest. I apologized for the inconvenience & explained that she should have enough to last her until we get to our new location. I provided her with our new location address and advised we will be there in 2 weeks and I will have her samples there ready for her to pick up at her convenience. Pt voiced her understanding

## 2023-08-18 NOTE — Progress Notes (Signed)
   Follow-Up Visit   Subjective  Rhonda Hester is a 47 y.o. female who presents for the following: Psoriasis on the feet and hands, knees and elbows, and back. Itch is an 8 out of 10. Tx is alternating Pimecrolimus ointment and Clobetasol cream. LOV 06/26/204. Tried V-TAMA cream and stopped-due to insufficient improvement.   Peri oral dermatitis is improving with Pimecrolimus ointment.   She is currently on Humira as prescribed for Sarcoidosis.  She is scheduled for an MRI on September 17th 2024 regarding the shoulder pain.    The following portions of the chart were reviewed this encounter and updated as appropriate:      Review of Systems: No other skin or systemic complaints.  Objective  Well appearing patient in no apparent distress; mood and affect are within normal limits.                        A focused examination was performed including face, feet, back, and hands. Relevant physical exam findings are noted in the Assessment and Plan.   Assessment & Plan   1. Perioral Dermatitis - Assessment: Improvement noted with very few, if any, peri-oral papules. Slight hyperpigmentation remains. - Plan: Continue Pimecrolimus cream application twice a week to maintain improvement.  2. Psoriasis - Assessment: Significant scaling observed on bilateral soles with 8/10 pruritus. Improvement noted on the face, but hands, feet, and elbows remain affected. - Plan: Continue application of clobetasol cream twice daily for two weeks, alternating with Pimecrolimus. Introduce [place], providing a starter sample and a maintenance pack for two months. Schedule reevaluation in two months to assess the effectiveness of the combined treatment with Humira and Otezla.  Side effects and risk / benefits explained in detail  3. Pruritus - Assessment: Symptoms secondary to emotional stress and possible physical triggers by water changes. - Plan: Recommend daytime use of CeraVe  Anti-Itch moisturizer with pramoxine. Prescribe hydroxyzine 25 mg for nighttime use to calm histamine release and reduce welts. Adjust dosage as needed to manage grogginess.  4. Insurance and Medication Refills - Assessment: Patient experiencing difficulties obtaining medication refills due to insurance issues. - Plan: Investigate insurance authorization to ensure validity for a year. Send a refill for the patient's current medication.  5. Sarcoidosis - Assessment: Patient currently being treated with Humira for sarcoidosis. - Plan: Continue administration of Humira as prescribed. Monitor patient for any changes or complications.     Return in about 3 months (around 11/18/2023) for Psoriasis Follow UP.

## 2023-08-18 NOTE — Patient Instructions (Signed)
Hello Rhonda Hester,  Thank you for visiting [place] today. Your dedication to managing your health conditions is greatly appreciated, and we are here to support you throughout your treatment journey.  Here is a summary of the key instructions and recommendations from today's consultation:  - Medications for Psoriasis and Pruritus:   - Clobetasol: Continue application twice a day for two weeks, alternating with Pimecrolimus.   - Otezla: Begin with the starter sample provided today, followed by a maintenance pack for two months.   - Hydroxyzine 25 mg: For nighttime itching, start with this dosage. Adjust to half a tablet during weekdays if grogginess occurs, and a full tablet on weekends.  - Perioral Dermatitis:   - Pimecrolimus: Continue applying twice a week to affected areas.  - General Skin Care:   - CeraVe Anti-Itch Moisturizer: Use as needed to manage itching.  - Follow-Up:   - We will see you again in two months to evaluate the effectiveness of your treatment plan and make any necessary adjustments.  - Additional Notes:   - We will address insurance coverage issues for your medications to ensure continuity of care.   - Please report any worsening of symptoms or adverse reactions to the treatments prescribed.  We are committed to helping you achieve the best possible outcomes with your treatment. Should you have any questions or concerns regarding your treatment plan, please do not hesitate to contact our office.  Warm regards,  Dr. [facility]   Important Information  Due to recent changes in healthcare laws, you may see results of your pathology and/or laboratory studies on MyChart before the doctors have had a chance to review them. We understand that in some cases there may be results that are confusing or concerning to you. Please understand that not all results are received at the same time and often the doctors may need to interpret multiple results in order to provide  you with the best plan of care or course of treatment. Therefore, we ask that you please give [place] 2 business days to thoroughly review all your results before contacting the office for clarification. Should we see a critical lab result, you will be contacted sooner.   If You Need Anything After Your Visit  If you have any questions or concerns for your doctor, please call our main line at [phone] If no one answers, please leave a voicemail as directed and we will return your call as soon as possible. Messages left after 4 pm will be answered the following business day.   You may also send [place] a message via MyChart. We typically respond to MyChart messages within 1-2 business days.  For prescription refills, please ask your pharmacy to contact our office. Our fax number is [phone].  If you have an urgent issue when the clinic is closed that cannot wait until the next business day, you can page your doctor at the number below.    Please note that while we do our best to be available for urgent issues outside of office hours, we are not available 24/7.   If you have an urgent issue and are unable to reach [place], you may choose to seek medical care at your doctor's office, retail clinic, urgent care center, or emergency room.  If you have a medical emergency, please immediately call 911 or go to the emergency department. In the event of inclement weather, please call our main line at [phone] for an update on the status of any  delays or closures.  Dermatology Medication Tips: Please keep the boxes that topical medications come in in order to help keep track of the instructions about where and how to use these. Pharmacies typically print the medication instructions only on the boxes and not directly on the medication tubes.   If your medication is too expensive, please contact our office at [phone] or send [place] a message through MyChart.   We are unable to tell what your co-pay for  medications will be in advance as this is different depending on your insurance coverage. However, we may be able to find a substitute medication at lower cost or fill out paperwork to get insurance to cover a needed medication.   If a prior authorization is required to get your medication covered by your insurance company, please allow [place] 1-2 business days to complete this process.  Drug prices often vary depending on where the prescription is filled and some pharmacies may offer cheaper prices.  The website www.goodrx.com contains coupons for medications through different pharmacies. The prices here do not account for what the cost may be with help from insurance (it may be cheaper with your insurance), but the website can give you the price if you did not use any insurance.  - You can print the associated coupon and take it with your prescription to the pharmacy.  - You may also stop by our office during regular business hours and pick up a GoodRx coupon card.  - If you need your prescription sent electronically to a different pharmacy, notify our office through [facility] or by phone at [phone]

## 2023-09-01 ENCOUNTER — Ambulatory Visit (INDEPENDENT_AMBULATORY_CARE_PROVIDER_SITE_OTHER): Payer: 59

## 2023-09-01 VITALS — Ht 67.0 in | Wt 245.0 lb

## 2023-09-01 DIAGNOSIS — Z3042 Encounter for surveillance of injectable contraceptive: Secondary | ICD-10-CM | POA: Diagnosis not present

## 2023-09-01 MED ORDER — MEDROXYPROGESTERONE ACETATE 150 MG/ML IM SUSP
150.0000 mg | Freq: Once | INTRAMUSCULAR | Status: AC
Start: 2023-09-01 — End: 2023-09-01
  Administered 2023-09-01: 150 mg via INTRAMUSCULAR

## 2023-09-01 NOTE — Progress Notes (Signed)
Pt came in for her depo injection. Pt is not sexually active. Next injection 11/18-12/2. Pt will be scheduled before next appt.

## 2023-09-02 ENCOUNTER — Ambulatory Visit
Admission: RE | Admit: 2023-09-02 | Discharge: 2023-09-02 | Disposition: A | Discharge status: Home / Self Care | Payer: 59 | Source: Ambulatory Visit | Attending: Nurse Practitioner | Admitting: Nurse Practitioner

## 2023-09-02 DIAGNOSIS — R2231 Localized swelling, mass and lump, right upper limb: Secondary | ICD-10-CM

## 2023-09-06 ENCOUNTER — Ambulatory Visit: Payer: Self-pay

## 2023-09-13 ENCOUNTER — Ambulatory Visit
Admission: RE | Admit: 2023-09-13 | Discharge: 2023-09-13 | Disposition: A | Discharge status: Home / Self Care | Payer: 59 | Source: Ambulatory Visit | Attending: Sports Medicine | Admitting: Sports Medicine

## 2023-09-13 DIAGNOSIS — G8929 Other chronic pain: Secondary | ICD-10-CM

## 2023-10-26 ENCOUNTER — Ambulatory Visit (INDEPENDENT_AMBULATORY_CARE_PROVIDER_SITE_OTHER): Payer: 59 | Admitting: Nurse Practitioner

## 2023-10-26 ENCOUNTER — Encounter: Payer: Self-pay | Admitting: Nurse Practitioner

## 2023-10-26 VITALS — BP 130/93 | HR 94 | Temp 98.8°F | Resp 14 | Ht 67.0 in | Wt 245.0 lb

## 2023-10-26 DIAGNOSIS — N898 Other specified noninflammatory disorders of vagina: Secondary | ICD-10-CM

## 2023-10-26 DIAGNOSIS — H6690 Otitis media, unspecified, unspecified ear: Secondary | ICD-10-CM

## 2023-10-26 DIAGNOSIS — R002 Palpitations: Secondary | ICD-10-CM

## 2023-10-26 MED ORDER — AMOXICILLIN-POT CLAVULANATE 875-125 MG PO TABS: 1.0000 | ORAL_TABLET | Freq: Two times a day (BID) | ORAL | 0 refills | Status: DC

## 2023-10-26 MED ORDER — FLUCONAZOLE 150 MG PO TABS: 150.0000 mg | ORAL_TABLET | Freq: Every day | ORAL | 0 refills | Status: DC

## 2023-10-26 NOTE — Progress Notes (Signed)
Pt is here for ear pain   Complaining of left ear pain for X3 weeks   Complaining of vaginal itching for X1 month   Complaining of feeling more anxious than normal, started [employer]

## 2023-10-26 NOTE — Patient Instructions (Addendum)
1. Vaginal irritation  - fluconazole (DIFLUCAN) 150 MG tablet; Take 1 tablet (150 mg total) by mouth daily. Take 1 tablet today and take second tablet 3 days later  Dispense: 2 tablet; Refill: 0 - NuSwab Vaginitis Plus (VG+)   2. Acute otitis media, unspecified otitis media type  - amoxicillin-clavulanate (AUGMENTIN) 875-125 MG tablet; Take 1 tablet by mouth 2 (two) times daily.  Dispense: 20 tablet; Refill: 0 - Ambulatory referral to ENT  Follow up:  Follow up in 3 months

## 2023-10-26 NOTE — Progress Notes (Signed)
Subjective   Patient ID: Rhonda Hester, female    DOB: 12/24/1976, 47 y.o.   MRN: [id number]  Chief Complaint  Patient presents with   Ear Pain    Referring provider: Ivonne Andrew, NP  Rhonda Hester is a 47 y.o. 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  She states that left ear is hurting for the past several days.  She states that she does have decreased hearing on the ear.  She also complains today of vaginal irritation and itching.  Patient states that yesterday she was having heart palpitations.  EKG in office was normal sinus rhythm. Denies f/c/s, n/v/d, hemoptysis, PND, leg swelling Denies chest pain or edema       Allergies  Allergen Reactions   Acyclovir And Related Other (See Comments)    Patient states it "made me feel like I can't breath"    Bee Pollen Itching and Other (See Comments)     coughing   Citric Acid Other (See Comments)    AVOIDS DUE TO INTERSTITIAL CYSTITIS   Dust Mite Extract Itching and Other (See Comments)     coughing   Grapeseed Extract [Nutritional Supplements] Other (See Comments)    Throat itching and burning   Pineapple Itching    Burning in mouth and  tingling lips   Pollen Extract Itching and Other (See Comments)     coughing   Nickel Rash    Immunization History  Administered Date(s) Administered   Dtap, Unspecified 03/25/1977, 06/15/1977, 12/02/1977, 04/05/1979, 05/14/1981   HPV 9-valent 11/15/2019   Influenza Whole 09/02/2011, 09/26/2012   Influenza,inj,Quad PF,6+ Mos 12/27/2012, 09/04/2015, 09/20/2017, 08/10/2018, 08/27/2019, 11/20/2019   Influenza-Unspecified 12/27/2012, 09/04/2015, 09/20/2017, 08/10/2018   Measles 04/29/1978   Mumps 04/29/1978   Pneumococcal Conjugate-13 11/14/2018   Pneumococcal Polysaccharide-23 05/10/2016   Polio, Unspecified 03/25/1977, 06/15/1977, 12/02/1977, 04/29/1978, 04/05/1979, 05/14/1981   Rubella 04/29/1978   Tdap 05/10/2016    Tobacco History: Social History   Tobacco Use  Smoking Status Some Days   Current packs/day: 0.00   Average packs/day: 0.3 packs/day for 6.0 years (1.8 ttl pk-yrs)   Types: Cigars, Cigarettes   Start date: 11/2010   Last attempt to quit: 11/2016   Years since quitting: 6.9  Smokeless Tobacco Never  Tobacco Comments   "Smokes 1 black and mild occasionally"   Ready to quit: Not Answered Counseling given: Not Answered Tobacco comments: "Smokes 1 black and mild occasionally"   Outpatient Encounter Medications as of 10/26/2023  Medication Sig   Adalimumab 40 MG/0.8ML PNKT Inject into the skin.   amoxicillin-clavulanate (AUGMENTIN) 875-125 MG  tablet Take 1 tablet by mouth 2 (two) times daily.   Apremilast (OTEZLA) 30 MG TABS Take 1 tablet (30 mg total) by mouth 2 (two) times daily.   clobetasol ointment (TEMOVATE) 0.05 % Apply 1 Application topically 2 (two) times daily. Apply to affected areas twice daily then stop. Alternate with Pimecrolimus cream   DULERA 200-5 MCG/ACT AERO Inhale 2 puffs into the lungs 2 (two) times daily.   fluconazole (DIFLUCAN) 150 MG tablet Take 1 tablet (150 mg total) by mouth daily. Take 1 tablet today and take second tablet 3 days  later   hydrOXYzine (ATARAX) 25 MG tablet Take 1 tablet (25 mg total) by mouth at bedtime. Take one per day. Do not drive while taking   ibuprofen (ADVIL) 800 MG tablet Take 1 tablet (800 mg total) by mouth every 8 (eight) hours as needed (pain).   oxyCODONE (ROXICODONE) 5 MG immediate release tablet Take 1 tablet (5 mg total) by mouth every 6 (six) hours as needed for up to 6 doses for severe pain or breakthrough pain.   pimecrolimus (ELIDEL) 1 % cream Apply topically 2 (two) times daily.   pregabalin (LYRICA) 25 MG capsule Take 25 mg by mouth 3 (three) times daily.   aluminum-magnesium hydroxide-simethicone (MAALOX) 200-200-20 MG/5ML SUSP Take 15 mLs by mouth 3 (three) times daily as needed. (Patient not taking: Reported on 07/21/2023)   pantoprazole (PROTONIX) 40 MG tablet Take 1 tablet (40 mg total) by mouth daily for 14 days. (Patient not taking: Reported on 07/21/2023)   [DISCONTINUED] amoxicillin-clavulanate (AUGMENTIN) 875-125 MG tablet Take 1 tablet by mouth 2 (two) times daily. (Patient not taking: Reported on 09/01/2023)   [DISCONTINUED] famotidine (PEPCID) 20 MG tablet Take 1 tablet (20 mg total) by mouth 2 (two) times daily.   Facility-Administered Encounter Medications as of 10/26/2023  Medication   bupivacaine (MARCAINE) 0.5 % 15 mL, phenazopyridine (PYRIDIUM) 400 mg bladder mixture    Review of Systems  Review of Systems  Constitutional: Negative.   HENT:  Positive for ear pain.   Cardiovascular: Negative.   Gastrointestinal: Negative.   Genitourinary:  Positive for vaginal discharge.  Allergic/Immunologic: Negative.   Neurological: Negative.   Psychiatric/Behavioral: Negative.       Objective:   BP (!) 130/93 (BP Location: Right Arm, Patient Position: Sitting, Cuff Size: Large)   Pulse 94   Temp 98.8 F (37.1 C)   Resp 14   Ht 5\' 7"  (1.702 m)   Wt 245 lb (111.1 kg)   SpO2 99%   BMI 38.37 kg/m   Wt Readings from Last 5 Encounters:  10/26/23 245 lb (111.1 kg)   09/01/23 245 lb (111.1 kg)  07/21/23 245 lb (111.1 kg)  06/13/23 235 lb (106.6 kg)  05/02/23 235 lb 9.6 oz (106.9 kg)     Physical Exam Vitals and nursing note reviewed.  Constitutional:      General: She is not in acute distress.    Appearance: She is well-developed.  HENT:     Left Ear: Decreased hearing noted. Drainage present. Tympanic membrane is erythematous.  Cardiovascular:     Rate and Rhythm: Normal rate and regular rhythm.  Pulmonary:     Effort: Pulmonary effort is normal.     Breath sounds: Normal breath sounds.  Neurological:     Mental Status: She is alert and oriented to person, place, and time.       Assessment & Plan:   Vaginal irritation -     Fluconazole; Take 1 tablet (  150 mg total) by mouth daily. Take 1 tablet today and take second tablet 3 days later  Dispense: 2 tablet; Refill: 0 -     NuSwab Vaginitis Plus (VG+)  Acute otitis media, unspecified otitis media type -     Amoxicillin-Pot Clavulanate; Take 1 tablet by mouth 2 (two) times daily.  Dispense: 20 tablet; Refill: 0 -     Ambulatory referral to ENT  Heart palpitations -     EKG 12-Lead     Return in about 3 months (around 01/26/2024).   Ivonne Andrew, NP 10/26/2023

## 2023-10-28 ENCOUNTER — Encounter (INDEPENDENT_AMBULATORY_CARE_PROVIDER_SITE_OTHER): Payer: Self-pay | Admitting: Otolaryngology

## 2023-10-28 LAB — NUSWAB VAGINITIS PLUS (VG+)
Candida albicans, NAA: NEGATIVE
Candida glabrata, NAA: NEGATIVE
Chlamydia trachomatis, NAA: NEGATIVE
Neisseria gonorrhoeae, NAA: NEGATIVE
Trich vag by NAA: NEGATIVE

## 2023-11-01 ENCOUNTER — Encounter: Payer: Self-pay | Admitting: Gastroenterology

## 2023-11-14 ENCOUNTER — Encounter (INDEPENDENT_AMBULATORY_CARE_PROVIDER_SITE_OTHER): Payer: Self-pay

## 2023-11-14 ENCOUNTER — Ambulatory Visit: Payer: 59

## 2023-11-16 ENCOUNTER — Encounter: Payer: Self-pay | Admitting: Dermatology

## 2023-11-16 ENCOUNTER — Ambulatory Visit (INDEPENDENT_AMBULATORY_CARE_PROVIDER_SITE_OTHER): Payer: 59 | Admitting: Dermatology

## 2023-11-16 DIAGNOSIS — R197 Diarrhea, unspecified: Secondary | ICD-10-CM | POA: Diagnosis not present

## 2023-11-16 DIAGNOSIS — L309 Dermatitis, unspecified: Secondary | ICD-10-CM

## 2023-11-16 DIAGNOSIS — L409 Psoriasis, unspecified: Secondary | ICD-10-CM

## 2023-11-16 MED ORDER — CLOBETASOL PROPIONATE 0.05 % EX OINT: 1.0000 | TOPICAL_OINTMENT | Freq: Two times a day (BID) | CUTANEOUS | 2 refills | Status: AC

## 2023-11-16 MED ORDER — PIMECROLIMUS 1 % EX CREA: TOPICAL_CREAM | Freq: Two times a day (BID) | CUTANEOUS | 3 refills | Status: DC

## 2023-11-16 NOTE — Patient Instructions (Addendum)
Thank you for visiting [place] today. Your dedication to managing your skin condition and overall health is greatly appreciated. Below is a summary of the essential instructions from today's consultation:  [place] Prescription: Continue taking [place] as prescribed. It's important to remain consistent with this medication for it to be effective.  - Diarrhea Management: Use Imodium or Pepto-Bismol to manage any diarrhea. Follow the dosage instructions provided on the package.  - Topical Treatments:   - Clobetasol Ointment: Apply as directed, alternating with pimecrolimus cream every 2 weeks. Do not use clobetasol on your face.   - Pimecrolimus Cream: Alternate with clobetasol ointment every 2 weeks as directed.  - Medication Inquiry: Please send a picture of the medication you received unexpectedly via MyChart for our review.  - Lab Work: Before considering a switch to injectable treatments, lab work is necessary. We will check the quantiferon and hepatitis panel.  - Follow-Up Appointment: We have scheduled a follow-up appointment in three months to evaluate your progress and discuss any potential adjustments to your treatment plan.  Please make sure to pick up some Imodium from the store and take another dose before bed tonight. Consistent use of the creams as instructed is crucial, and we are hopeful for significant improvement within the next three months.  Warm regards,  Dr. [facility]   Important Information  Due to recent changes in healthcare laws, you may see results of your pathology and/or laboratory studies on MyChart before the doctors have had a chance to review them. We understand that in some cases there may be results that are confusing or concerning to you. Please understand that not all results are received at the same time and often the doctors may need to interpret multiple results in order to provide you with the best plan of care or course of treatment.  Therefore, we ask that you please give [place] 2 business days to thoroughly review all your results before contacting the office for clarification. Should we see a critical lab result, you will be contacted sooner.   If You Need Anything After Your Visit  If you have any questions or concerns for your doctor, please call our main line at [phone] If no one answers, please leave a voicemail as directed and we will return your call as soon as possible. Messages left after 4 pm will be answered the following business day.   You may also send [place] a message via MyChart. We typically respond to MyChart messages within 1-2 business days.  For prescription refills, please ask your pharmacy to contact our office. Our fax number is [phone].  If you have an urgent issue when the clinic is closed that cannot wait until the next business day, you can page your doctor at the number below.    Please note that while we do our best to be available for urgent issues outside of office hours, we are not available 24/7.   If you have an urgent issue and are unable to reach [place], you may choose to seek medical care at your doctor's office, retail clinic, urgent care center, or emergency room.  If you have a medical emergency, please immediately call 911 or go to the emergency department. In the event of inclement weather, please call our main line at [phone] for an update on the status of any delays or closures.  Dermatology Medication Tips: Please keep the boxes that topical medications come in in order to help keep track of the instructions  about where and how to use these. Pharmacies typically print the medication instructions only on the boxes and not directly on the medication tubes.   If your medication is too expensive, please contact our office at [phone] or send [place] a message through MyChart.   We are unable to tell what your co-pay for medications will be in advance as this is different  depending on your insurance coverage. However, we may be able to find a substitute medication at lower cost or fill out paperwork to get insurance to cover a needed medication.   If a prior authorization is required to get your medication covered by your insurance company, please allow [place] 1-2 business days to complete this process.  Drug prices often vary depending on where the prescription is filled and some pharmacies may offer cheaper prices.  The website www.goodrx.com contains coupons for medications through different pharmacies. The prices here do not account for what the cost may be with help from insurance (it may be cheaper with your insurance), but the website can give you the price if you did not use any insurance.  - You can print the associated coupon and take it with your prescription to the pharmacy.  - You may also stop by our office during regular business hours and pick up a GoodRx coupon card.  - If you need your prescription sent electronically to a different pharmacy, notify our office through [facility] or by phone at [phone]

## 2023-11-16 NOTE — Progress Notes (Signed)
   Follow-Up Visit   Subjective  Rhonda Hester is a 47 y.o. female who presents for the following: psoriasis  Patient present today for follow up visit for follow up. Patient was last evaluated on 08/18/23. Patient reports sxs are better. She has been alternative Clobetasol and Pimecrolimus ointments every 2 weeks. She has also started [place] 30mg  tablet. Patient denies medication changes.  The following portions of the chart were reviewed this encounter and updated as appropriate: medications, allergies, medical history  Review of Systems:  No other skin or systemic complaints except as noted in HPI or Assessment and Plan.  Objective  Well appearing patient in no apparent distress; mood and affect are within normal limits.   A focused examination was performed of the following areas:   Relevant exam findings are noted in the Assessment and Plan.                 Assessment & Plan   1. Psoriasis - Assessment: Currently being treated with alternating clobetasol ointment and pimecrolimus cream every 2 weeks. - Plan: Encourage consistent use of creams for optimal results. Schedule a reevaluation of the condition in 3 months.  2. Otezla-induced Diarrhea - Assessment: Patient experiencing diarrhea potentially due to [place]. - Plan: Advise the patient to manage diarrhea with Imodium as per recommended dosage. Instruct consistent twice-daily intake of Otezla if diarrhea is controlled. Consider reevaluating the necessity of Otezla in 3 months, with a potential switch to injectables if required.  3.Lab Work - Assessment: Preparation for potential switch to injectable medications. - Plan: Order quantiferon and hepatitis panel tests. Provide the patient with a lab slip for the tests.    No follow-ups on file.    Documentation: I have reviewed the above documentation for accuracy and completeness, and I agree with the above.  I, Shirron Marcha Solders, CMA, am acting as scribe for  [employer], DO.   Langston Reusing, DO

## 2023-11-21 ENCOUNTER — Ambulatory Visit (INDEPENDENT_AMBULATORY_CARE_PROVIDER_SITE_OTHER): Payer: 59

## 2023-11-21 DIAGNOSIS — Z3042 Encounter for surveillance of injectable contraceptive: Secondary | ICD-10-CM

## 2023-11-21 MED ORDER — MEDROXYPROGESTERONE ACETATE 150 MG/ML IM SUSP
150.0000 mg | Freq: Once | INTRAMUSCULAR | Status: AC
  Administered 2023-11-21: 150 mg via INTRAMUSCULAR

## 2023-11-21 NOTE — Progress Notes (Signed)
Pt was given injection on time. It was given in right glut and tolerated well. Will return in three months.  Renelda Loma RMA

## 2023-12-12 ENCOUNTER — Emergency Department (HOSPITAL_COMMUNITY)
Admission: EM | Admit: 2023-12-12 | Discharge: 2023-12-12 | Disposition: A | Discharge status: Home / Self Care | Payer: 59 | Attending: Student | Admitting: Student

## 2023-12-12 ENCOUNTER — Emergency Department (HOSPITAL_COMMUNITY): Payer: 59

## 2023-12-12 ENCOUNTER — Encounter (HOSPITAL_COMMUNITY): Payer: Self-pay

## 2023-12-12 ENCOUNTER — Other Ambulatory Visit: Payer: Self-pay

## 2023-12-12 ENCOUNTER — Telehealth (INDEPENDENT_AMBULATORY_CARE_PROVIDER_SITE_OTHER): Payer: Self-pay | Admitting: Otolaryngology

## 2023-12-12 DIAGNOSIS — R0789 Other chest pain: Secondary | ICD-10-CM | POA: Insufficient documentation

## 2023-12-12 DIAGNOSIS — R079 Chest pain, unspecified: Secondary | ICD-10-CM

## 2023-12-12 DIAGNOSIS — M25512 Pain in left shoulder: Secondary | ICD-10-CM | POA: Insufficient documentation

## 2023-12-12 LAB — CBC
HCT: 43 % (ref 36.0–46.0)
Hemoglobin: 14.3 g/dL (ref 12.0–15.0)
MCH: 28.7 pg (ref 26.0–34.0)
MCHC: 33.3 g/dL (ref 30.0–36.0)
MCV: 86.3 fL (ref 80.0–100.0)
Platelets: 289 10*3/uL (ref 150–400)
RBC: 4.98 MIL/uL (ref 3.87–5.11)
RDW: 13.2 % (ref 11.5–15.5)
WBC: 7.8 10*3/uL (ref 4.0–10.5)
nRBC: 0 % (ref 0.0–0.2)

## 2023-12-12 LAB — BASIC METABOLIC PANEL
Anion gap: 10 (ref 5–15)
BUN: 10 mg/dL (ref 6–20)
CO2: 18 mmol/L — ABNORMAL LOW (ref 22–32)
Calcium: 9.3 mg/dL (ref 8.9–10.3)
Chloride: 108 mmol/L (ref 98–111)
Creatinine, Ser: 0.54 mg/dL (ref 0.44–1.00)
GFR, Estimated: >60 mL/min (ref >60)
Glucose, Bld: 186 mg/dL — ABNORMAL HIGH (ref 70–99)
Potassium: 3.8 mmol/L (ref 3.5–5.1)
Sodium: 136 mmol/L (ref 135–145)

## 2023-12-12 LAB — TROPONIN I (HIGH SENSITIVITY): Troponin I (High Sensitivity): 2 ng/L (ref <18)

## 2023-12-12 LAB — HCG, SERUM, QUALITATIVE: Preg, Serum: NEGATIVE

## 2023-12-12 MED ORDER — CYCLOBENZAPRINE HCL 5 MG PO TABS
5.0000 mg | ORAL_TABLET | Freq: Three times a day (TID) | ORAL | 0 refills | Status: DC | PRN
Start: 2023-12-12 — End: 2024-08-06

## 2023-12-12 MED ORDER — LIDOCAINE 5 % EX PTCH
1.0000 | MEDICATED_PATCH | CUTANEOUS | Status: DC
  Administered 2023-12-12: 1 via TRANSDERMAL
  Filled 2023-12-12: qty 1

## 2023-12-12 MED ORDER — CYCLOBENZAPRINE HCL 10 MG PO TABS
5.0000 mg | ORAL_TABLET | Freq: Once | ORAL | Status: AC
  Administered 2023-12-12: 5 mg via ORAL
  Filled 2023-12-12: qty 1

## 2023-12-12 NOTE — Discharge Instructions (Addendum)
Your blood work was reassuring today, I believe that your pain is likely secondary to musculoskeletal, tissue, and you are likely having spasms, from your referred shoulder pain.  Please return to the ER if you feel like your symptoms are worsening. I have sent you muscle relaxers to the pharmacy, for further management.

## 2023-12-12 NOTE — ED Notes (Signed)
Pt was complaining of increased chest pain. Repeat EKG and vitals were taken. Triage RN aware.

## 2023-12-12 NOTE — ED Triage Notes (Signed)
Intermittent Left sided chest pain that radiates into left shoulder since yesterday. Pt states she is always sob, and her sob is the same as usual. Denies nausea.

## 2023-12-12 NOTE — ED Notes (Signed)
Pt complaining of worsening chest pain- EKG repeated.

## 2023-12-12 NOTE — ED Provider Notes (Signed)
[place] EMERGENCY DEPARTMENT AT [facility] Provider Note   CSN: [id number] Arrival date & time: 12/12/23  1850     History  Chief Complaint  Patient presents with   Chest Pain    Rhonda Hester is a 47 y.o. female, hx of sarcoidosis, who presents to the the ED 2/2 to left shoulder pain, has been going on for the last 7 months, and now left chest pain, radiating from her left shoulder, has been going on since 7 AM today.  She states that sharp and stabbing, radiates from her left shoulder, worse when she moves her arm.  She denies any shortness of breath, that is worsening, as she has history of sarcoidosis, she states that it is at baseline currently.  She denies any swelling, redness, wounds.    Home Medications Prior to Admission medications   Medication Sig Start Date End Date Taking? Authorizing Provider  cyclobenzaprine (FLEXERIL) 5 MG tablet Take 1 tablet (5 mg total) by mouth 3 (three) times daily as needed. 12/12/23  Yes Royalty Fakhouri L, PA  Adalimumab 40 MG/0.8ML PNKT Inject into the skin. 04/06/22   [provider]  aluminum-magnesium hydroxide-simethicone (MAALOX) 200-200-20 MG/5ML SUSP Take 15 mLs by mouth 3 (three) times daily as needed. Patient not taking: Reported on 07/21/2023 04/07/23   Rising, Lurena Joiner, PA-C  amoxicillin-clavulanate (AUGMENTIN) 875-125 MG tablet Take 1 tablet by mouth 2 (two) times daily. 10/26/23   Ivonne Andrew, NP  Apremilast (OTEZLA) 30 MG TABS Take 1 tablet (30 mg total) by mouth 2 (two) times daily. 08/18/23   Terri Piedra, DO  clobetasol ointment (TEMOVATE) 0.05 % Apply 1 Application topically 2 (two) times daily. Apply to affected areas twice daily then stop. Alternate with Pimecrolimus cream 11/16/23   Terri Piedra, DO  DULERA 200-5 MCG/ACT AERO Inhale 2 puffs into the lungs 2 (two) times daily. 03/29/20   [provider]  fluconazole (DIFLUCAN) 150 MG tablet Take 1 tablet (150 mg total) by mouth  daily. Take 1 tablet today and take second tablet 3 days later 10/26/23   Ivonne Andrew, NP  hydrOXYzine (ATARAX) 25 MG tablet Take 1 tablet (25 mg total) by mouth at bedtime. Take one per day. Do not drive while taking 04/04/80   Terri Piedra, DO  ibuprofen (ADVIL) 800 MG tablet Take 1 tablet (800 mg total) by mouth every 8 (eight) hours as needed (pain). 01/14/23   Zenia Resides, MD  oxyCODONE (ROXICODONE) 5 MG immediate release tablet Take 1 tablet (5 mg total) by mouth every 6 (six) hours as needed for up to 6 doses for severe pain or breakthrough pain. 07/01/21   Gailen Shelter, PA  pantoprazole (PROTONIX) 40 MG tablet Take 1 tablet (40 mg total) by mouth daily for 14 days. Patient not taking: Reported on 07/21/2023 04/07/23 06/13/23  Rising, Lurena Joiner, PA-C  pimecrolimus (ELIDEL) 1 % cream Apply topically 2 (two) times daily. 11/16/23   Terri Piedra, DO  pregabalin (LYRICA) 25 MG capsule Take 25 mg by mouth 3 (three) times daily. 03/18/23   [provider]  famotidine (PEPCID) 20 MG tablet Take 1 tablet (20 mg total) by mouth 2 (two) times daily. 05/24/19 12/01/20  Mike Gip, FNP      Allergies    Acyclovir and related, Bee pollen, Citric acid, Dust mite extract, Grapeseed extract [nutritional supplements], Pineapple, Pollen extract, and Nickel    Review of Systems   Review of Systems  Constitutional:  Negative for fever.  Respiratory:  Negative for shortness of breath.   Cardiovascular:  Positive for chest pain.  Musculoskeletal:  Positive for arthralgias.    Physical Exam Updated Vital Signs BP (!) 149/99   Pulse 93   Temp 99.3 F (37.4 C) (Oral)   Resp 18   Ht 5\' 7"  (1.702 m)   Wt 111.1 kg   SpO2 97%   BMI 38.36 kg/m  Physical Exam Vitals and nursing note reviewed.  Constitutional:      General: She is not in acute distress.    Appearance: She is well-developed.  HENT:     Head: Normocephalic and atraumatic.  Eyes:     Conjunctiva/sclera:  Conjunctivae normal.  Cardiovascular:     Rate and Rhythm: Normal rate and regular rhythm.     Heart sounds: No murmur heard. Pulmonary:     Effort: Pulmonary effort is normal. No respiratory distress.     Breath sounds: Normal breath sounds.  Chest:     Comments: TTP of L chest wall w/o ecchymoses, crepitus, or wounds Abdominal:     Palpations: Abdomen is soft.     Tenderness: There is no abdominal tenderness.  Musculoskeletal:        General: No swelling.     Cervical back: Neck supple.     Comments: TTP Of L chest wall ttp. Worse with abduction, adduction, external and internal rotation of L shoulder. No crepitus or wound.   Skin:    General: Skin is warm and dry.     Capillary Refill: Capillary refill takes less than 2 seconds.  Neurological:     Mental Status: She is alert.  Psychiatric:        Mood and Affect: Mood normal.     ED Results / Procedures / Treatments   Labs (all labs ordered are listed, but only abnormal results are displayed) Labs Reviewed  BASIC METABOLIC PANEL - Abnormal; Notable for the following components:      Result Value   CO2 18 (*)    Glucose, Bld 186 (*)    All other components within normal limits  CBC  HCG, SERUM, QUALITATIVE  TROPONIN I (HIGH SENSITIVITY)    EKG None  Radiology DG Chest 2 View Result Date: 12/12/2023 CLINICAL DATA:  Chest pain history of sarcoid EXAM: CHEST - 2 VIEW COMPARISON:  01/14/2023, 02/22/2021 FINDINGS: Scarring in the right upper lobe with stable left hilar distortion. No acute airspace disease or effusion. Stable cardiomediastinal silhouette. IMPRESSION: No active cardiopulmonary disease. Stable pulmonary scarring and left hilar distortion likely related to history of sarcoid. Electronically Signed   By: Jasmine Pang M.D.   On: 12/12/2023 20:53    Procedures Procedures    Medications Ordered in ED Medications  cyclobenzaprine (FLEXERIL) tablet 5 mg (has no administration in time range)  lidocaine  (LIDODERM) 5 % 1 patch (has no administration in time range)    ED Course/ Medical Decision Making/ A&P             HEART Score: 2                    Medical Decision Making Patient is a 47 year old female, here for left-sided chest pain, has been going on since 7 AM this morning.  She states she is mainly dealing with some left shoulder pain, for the last 7 months, is progressively gotten worse.  She states she had no arthritis, and some musculoskeletal things, that are wrong, but that  after getting an MRI.  She notes that now she feels like the pain is radiating from her left shoulder, to her chest, sharp and shooting.  Not associated with any shortness of breath.  She is not tachycardic, and she is overall well-appearing.  We will obtain troponins, chest x-ray for further evaluation.  Suspicious for musculoskeletal strain, secondary to pain with tenderness to palpation on exam  Amount and/or Complexity of Data Reviewed Labs: ordered.    Details: Troponin within normal limits, no leukocytosis, beta-hCG negative Radiology: ordered.    Details: Chest x-ray clear Discussion of management or test interpretation with external provider(s): PERC negative, troponin within normal limits.  Overall reassuring.  Given 1 dose of Flexeril, and encouraged to follow-up with PCP.  Heart score is 2, low risk, she will follow-up with PCP.  And suspicious that this likely musculoskeletal, secondary to tenderness to palpation on exam, and negative workup.  This pains been going on for the last few hours, thus reassuring.  We will have her follow-up with her PCP, return if symptoms worsen.  Additionally pain is worse with range of motion, which is otherwise pointing to musculoskeletal origin  Risk Prescription drug management.    Final Clinical Impression(s) / ED Diagnoses Final diagnoses:  Chest wall pain  Chest pain, unspecified type    Rx / DC Orders ED Discharge Orders          Ordered     cyclobenzaprine (FLEXERIL) 5 MG tablet  3 times daily PRN        12/12/23 2200              Aloysious Vangieson, Harley [place], PA 12/12/23 2204    Glendora Score, MD 12/13/23 1157

## 2023-12-12 NOTE — Telephone Encounter (Signed)
Spoke with patient, confirmed appt date, time, and location.

## 2023-12-13 ENCOUNTER — Encounter (INDEPENDENT_AMBULATORY_CARE_PROVIDER_SITE_OTHER): Payer: Self-pay

## 2023-12-13 ENCOUNTER — Ambulatory Visit (INDEPENDENT_AMBULATORY_CARE_PROVIDER_SITE_OTHER): Payer: 59 | Admitting: Otolaryngology

## 2023-12-13 ENCOUNTER — Other Ambulatory Visit: Payer: Self-pay

## 2023-12-13 VITALS — Ht 67.0 in | Wt 244.0 lb

## 2023-12-13 DIAGNOSIS — H938X2 Other specified disorders of left ear: Secondary | ICD-10-CM

## 2023-12-13 DIAGNOSIS — H608X3 Other otitis externa, bilateral: Secondary | ICD-10-CM

## 2023-12-13 DIAGNOSIS — H9190 Unspecified hearing loss, unspecified ear: Secondary | ICD-10-CM | POA: Diagnosis not present

## 2023-12-13 DIAGNOSIS — L309 Dermatitis, unspecified: Secondary | ICD-10-CM

## 2023-12-13 MED ORDER — HYDROXYZINE HCL 25 MG PO TABS: 25.0000 mg | ORAL_TABLET | Freq: Every evening | ORAL | 3 refills | Status: DC

## 2023-12-13 MED ORDER — TRIAMCINOLONE ACETONIDE 0.1 % EX CREA: 1.0000 | TOPICAL_CREAM | Freq: Two times a day (BID) | CUTANEOUS | 0 refills | Status: AC

## 2023-12-13 MED ORDER — CIPROFLOXACIN-DEXAMETHASONE 0.3-0.1 % OT SUSP: 4.0000 [drp] | Freq: Two times a day (BID) | OTIC | 1 refills | Status: AC

## 2023-12-13 NOTE — Patient Instructions (Signed)
Use Ciprodex 4 drops twice daily in left ear for 14 days.  Use kenalog cream - just to outside of left ear twice daily for 7 days Keep ear dry - use cotton ball coated in vaseline when you shower

## 2023-12-13 NOTE — Progress Notes (Signed)
Dear Dr. Tanda Rockers, Here is my assessment for our mutual patient, Rhonda [place]. Thank you for allowing me the opportunity to care for your patient. Please do not hesitate to contact me should you have any other questions. Sincerely, Dr. Jovita Kussmaul  Otolaryngology Clinic Note Referring provider: Dr. Tanda Rockers HPI:  Rhonda [place] is a 47 y.o. female kindly referred by Dr. Tanda Rockers for evaluation of bilateral ear discomfort. Initial visit (11/2023): Patient reports: left ear is intermittently painful. She reports that the left ear also feels clogged and full. She also reports drainage - does not know the color but foul odor. Given the odor, she has been using qtips to clean the inside of the ear. Sensitive to the touch. This has been ongoing for two months. She was prescribed Augmentin 10/26/2023 and given drops (unclear, do not see them in record) and they helped some but not fully. She also took a photo of the ear and noticed there was a tube in there. Hearing has declined Of note, PCP did irrigate ear and she also flushed ear with water.  Does report intermittent psoriasis or eczema over concha bowl This issue occurs intermittently but this time was the worst.  Patient denies: vertigo, tinnitus Patient additionally denies: deep pain in ear canal, eustachian tube symptoms such as popping, crackling, sensitive to pressure changes Patient also denies barotrauma, vestibular suppressant use, ototoxic medication use Prior ear surgery: BTT (as a child) Reports some allergy (seasonal) but denies chronic sinusitis sx (pain, pressure, frequent sinus infections, hyposmia)  Personal or FHx of bleeding dz or anesthesia difficulty: no  GLP-1: no AP/AC: no  Tobacco: reports quit around 2017. Lives in [place], [place]  PMHx: Sarcoidosis (including hepatic sarcoid, no advanced fibrosis; and involvement of pulm, skin, joint - followed by Rheum and Pulm), Allergic asthma, Psoriasis   Independent  Review of Additional Tests or Records:  Dr. Tanda Rockers (10/26/2023): left ear pain for past several days with decresae hearing. Noted drainage and erythematous TM; Rx: augmentin, ref ENT (also got fluconazole for vaginal irritation) Rhonda [place] (06/2022) - ear concerns, wetness in both ears, no other Sx; periodic nasal congestion; no noted TM findings. Rx: mineral oil, otherwise flonase for AR CBC 11/2023: WBC 7.8, ANC 3300; BMP: no AKI, Glc 186 No prior audios noted CTHead without 2019 independent review: mastoids, ME well aerated; no obvious ossicular or otic capsule path noted; scattered paranasal sinus disease  PMH/Meds/All/SocHx/FamHx/ROS:   Past Medical History:  Diagnosis Date   Allergic asthma    Allergy    SEASONAL   Anxiety    Breast mass 05/2017   lt breast lump   Eczema    Fibroid    GERD (gastroesophageal reflux disease)    History of anal fissures    History of Bell's palsy    2006  RIGHT SIDE--  RESOLVED   History of gastroesophageal reflux (GERD)    History of kidney stones    Hyperlipidemia    Interstitial cystitis    Psoriasis    Sarcoidosis of lung (HCC)    DX 2006--  PULMOLOGIST--  DR ZOXW- on 2 liters of oxygen, goes to pain clinic   Seasonal allergic rhinitis    Shortness of breath    Vaginal Pap smear, abnormal      Past Surgical History:  Procedure Laterality Date   CYSTO WITH HYDRODISTENSION N/A 08/24/2013   Procedure: CYSTOSCOPY/HYDRODISTENSION with instillation of marcaine and pyridium;  Surgeon: [name], MD;  Location: [facility];  Service: Urology;  Laterality: N/A;   CYSTOSCOPY/ HYDRODISTENTION/ BLADDER BX  06-09-2010   CYSTOSCOPY/URETEROSCOPY/HOLMIUM LASER/STENT PLACEMENT Right 01/30/2019   Procedure: CYSTOSCOPY/RETROGRADE/URETEROSCOPY/HOLMIUM LASER/STENT PLACEMENT;  Surgeon: Jerilee Field, MD;  Location: [facility];  Service: Urology;  Laterality: Right;   DILATION AND CURETTAGE OF UTERUS  1997   EXTRACORPOREAL  SHOCK WAVE LITHOTRIPSY Right 11/30/2018   Procedure: EXTRACORPOREAL SHOCK WAVE LITHOTRIPSY (ESWL);  Surgeon: Alfredo Martinez, MD;  Location: [facility];  Service: Urology;  Laterality: Right;   IR TRANSCATHETER BX  06/14/2018   IR US GUIDE VASC ACCESS RIGHT  06/14/2018   IR VENOGRAM HEPATIC W HEMODYNAMIC EVALUATION  06/14/2018    Family History  Problem Relation Age of Onset   Brain cancer Maternal Grandmother    Hyperlipidemia Other    Sleep apnea Other    Depression Maternal Aunt    Seizures Maternal Uncle    Colon cancer Neg Hx    Stomach cancer Neg Hx      Social Connections: Socially Isolated (09/16/2021)   Social Connection and Isolation Panel [NHANES]    Frequency of Communication with Friends and Family: Once a week    Frequency of Social Gatherings with Friends and Family: Once a week    Attends Religious Services: Never    Database administrator or Organizations: No    Attends Engineer, structural: Never    Marital Status: Never married      Current Outpatient Medications:    Adalimumab 40 MG/0.8ML PNKT, Inject into the skin., Disp: , Rfl:    ciprofloxacin-dexamethasone (CIPRODEX) OTIC suspension, Place 4 drops into the left ear 2 (two) times daily for 10 days., Disp: 7.5 mL, Rfl: 1   clobetasol ointment (TEMOVATE) 0.05 %, Apply 1 Application topically 2 (two) times daily. Apply to affected areas twice daily then stop. Alternate with Pimecrolimus cream, Disp: 60 g, Rfl: 2   cyclobenzaprine (FLEXERIL) 5 MG tablet, Take 1 tablet (5 mg total) by mouth 3 (three) times daily as needed., Disp: 30 tablet, Rfl: 0   DULERA 200-5 MCG/ACT AERO, Inhale 2 puffs into the lungs 2 (two) times daily., Disp: , Rfl:    oxyCODONE (ROXICODONE) 5 MG immediate release tablet, Take 1 tablet (5 mg total) by mouth every 6 (six) hours as needed for up to 6 doses for severe pain or breakthrough pain., Disp: 6 tablet, Rfl: 0   pimecrolimus (ELIDEL) 1 % cream, Apply topically 2 (two) times daily.,  Disp: 60 g, Rfl: 3   triamcinolone cream (KENALOG) 0.1 %, Apply 1 Application topically 2 (two) times daily for 7 days. Apply to outside of ear twice daily for 7 days, Disp: 15 g, Rfl: 0   aluminum-magnesium hydroxide-simethicone (MAALOX) 200-200-20 MG/5ML SUSP, Take 15 mLs by mouth 3 (three) times daily as needed. (Patient not taking: Reported on 12/13/2023), Disp: 335 mL, Rfl: 0   amoxicillin-clavulanate (AUGMENTIN) 875-125 MG tablet, Take 1 tablet by mouth 2 (two) times daily. (Patient not taking: Reported on 12/13/2023), Disp: 20 tablet, Rfl: 0   Apremilast (OTEZLA) 30 MG TABS, Take 1 tablet (30 mg total) by mouth 2 (two) times daily. (Patient not taking: Reported on 12/13/2023), Disp: 60 tablet, Rfl: 3   fluconazole (DIFLUCAN) 150 MG tablet, Take 1 tablet (150 mg total) by mouth daily. Take 1 tablet today and take second tablet 3 days later (Patient not taking: Reported on 12/13/2023), Disp: 2 tablet, Rfl: 0   hydrOXYzine (ATARAX) 25 MG tablet, Take 1 tablet (25 mg total) by mouth at bedtime. Take one per  day. Do not drive while taking (Patient not taking: Reported on 12/13/2023), Disp: 30 tablet, Rfl: 3   ibuprofen (ADVIL) 800 MG tablet, Take 1 tablet (800 mg total) by mouth every 8 (eight) hours as needed (pain)., Disp: 21 tablet, Rfl: 0   pantoprazole (PROTONIX) 40 MG tablet, Take 1 tablet (40 mg total) by mouth daily for 14 days. (Patient not taking: Reported on 07/21/2023), Disp: 14 tablet, Rfl: 0   pregabalin (LYRICA) 25 MG capsule, Take 25 mg by mouth 3 (three) times daily. (Patient not taking: Reported on 12/13/2023), Disp: , Rfl:  No current facility-administered medications for this visit.  Facility-Administered Medications Ordered in Other Visits:    bupivacaine (MARCAINE) 0.5 % 15 mL, phenazopyridine (PYRIDIUM) 400 mg bladder mixture, , Bladder Instillation, Once, Jerilee Field, MD   Physical Exam:   Ht 5\' 7"  (1.702 m)   Wt 244 lb (110.7 kg)   BMI 38.22 kg/m   Salient  findings:  CN II-XII intact Given history and complaints, ear microscopy was indicated and performed for evaluation with findings as below in physical exam section and in procedures. Durin microscopy, left EAC clear with mild wetness and Some thin debris on left TM but otherwise intact; ME appears well aerated; no PE tube noted Right TM clear, well aerated ME space Weber 512: mid Rinne 512: AC > BC b/l Anterior rhinoscopy: Septum relatively midline; no purulence noted on anterior rhino or significant lumpy-bumpy mucosa; bilateral inferior turbinates with modest hypertrophy No lesions of oral cavity/oropharynx No obviously palpable neck masses/lymphadenopathy/thyromegaly No respiratory distress or stridor   Seprately Identifiable Procedures:  Procedure: Bilateral ear microscopy using microscope (CPT 92504) Pre-procedure diagnosis: eczematoid otitis externa, otitis externa, left ear fullness Post-procedure diagnosis: same Indication: Concern for otitis externa left ear in setting of immunocompromised status; given patient's otologic complaints and history, for improved and comprehensive examination of external ear and tympanic membrane, bilateral otologic examination using microscope was performed  Procedure: Patient was placed semi-recumbent. Both ear canals were examined using the microscope with findings above. Some cerumen and debris removed on left using #3 suction, including on TM. Patient tolerated the procedure well.    Impression & Plans:  Rhonda [place] is a 47 y.o. female with history of sarcoidosis and eczema now with: 1. Chronic eczematous otitis externa of both ears   2. Ear fullness, left   3. Subjective hearing loss    Suspect some of her left ear symptoms due to chronic wetness and eczematoid change. She has irrigated the ear, and uses q-tips which I advised her to stop doing. Prior CT (ableit several years ago) looks reassuring. No other symptoms. As such, we will  treat her to rid of the debris and obtain an audio: - Start ciprodex drops (4 drops BID) x10d, and kenalog cream b/l EAC x7d - Keep ear dry, avoid q-tips - F/u 1 month with audio  See below regarding exact medications prescribed this encounter including dosages and route: Meds ordered this encounter  Medications   ciprofloxacin-dexamethasone (CIPRODEX) OTIC suspension    Sig: Place 4 drops into the left ear 2 (two) times daily for 10 days.    Dispense:  7.5 mL    Refill:  1   triamcinolone cream (KENALOG) 0.1 %    Sig: Apply 1 Application topically 2 (two) times daily for 7 days. Apply to outside of ear twice daily for 7 days    Dispense:  15 g    Refill:  0    Thank you  for allowing me the opportunity to care for your patient. Please do not hesitate to contact me should you have any other questions.  Sincerely, Jovita Kussmaul, MD Otolarynoglogist (ENT), [facility] Phone: [phone] Fax: [phone]  12/19/2023, 8:48 PM   MDM:  Level 4 - 99204 Complexity/Problems addressed: mod - chronic problem, now with exacerbation Data complexity: mod - independent review of CT; review of outside labs, and notes - Morbidity: mod  - Prescription Drug prescribed or managed: yes

## 2024-01-12 ENCOUNTER — Telehealth (INDEPENDENT_AMBULATORY_CARE_PROVIDER_SITE_OTHER): Payer: Self-pay | Admitting: Otolaryngology

## 2024-01-12 ENCOUNTER — Encounter (INDEPENDENT_AMBULATORY_CARE_PROVIDER_SITE_OTHER): Payer: Self-pay

## 2024-01-12 NOTE — Telephone Encounter (Signed)
LVM to confirm appt & location [id number] afm

## 2024-01-13 ENCOUNTER — Encounter (INDEPENDENT_AMBULATORY_CARE_PROVIDER_SITE_OTHER): Payer: Self-pay

## 2024-01-13 ENCOUNTER — Ambulatory Visit (INDEPENDENT_AMBULATORY_CARE_PROVIDER_SITE_OTHER): Payer: 59 | Admitting: Audiology

## 2024-01-13 ENCOUNTER — Ambulatory Visit (INDEPENDENT_AMBULATORY_CARE_PROVIDER_SITE_OTHER): Payer: 59 | Admitting: Otolaryngology

## 2024-01-13 VITALS — BP 139/92 | HR 103 | Resp 19 | Wt 244.0 lb

## 2024-01-13 DIAGNOSIS — H608X3 Other otitis externa, bilateral: Secondary | ICD-10-CM

## 2024-01-13 DIAGNOSIS — H9190 Unspecified hearing loss, unspecified ear: Secondary | ICD-10-CM | POA: Diagnosis not present

## 2024-01-13 DIAGNOSIS — H938X2 Other specified disorders of left ear: Secondary | ICD-10-CM

## 2024-01-13 DIAGNOSIS — Z0110 Encounter for examination of ears and hearing without abnormal findings: Secondary | ICD-10-CM | POA: Diagnosis not present

## 2024-01-13 NOTE — Progress Notes (Signed)
Dear Dr. Tanda Rockers, Here is my assessment for our mutual patient, Rhonda [place]. Thank you for allowing me the opportunity to care for your patient. Please do not hesitate to contact me should you have any other questions. Sincerely, Dr. Jovita Kussmaul  Otolaryngology Clinic Note Referring provider: Dr. Tanda Rockers HPI:  Rhonda H[place] is a 48 y.o. female kindly referred by Dr. Tanda Rockers for evaluation of bilateral ear discomfort.  Initial visit (11/2023): Patient reports: left ear is intermittently painful. She reports that the left ear also feels clogged and full. She also reports drainage - does not know the color but foul odor. Given the odor, she has been using qtips to clean the inside of the ear. Sensitive to the touch. This has been ongoing for two months. She was prescribed Augmentin 10/26/2023 and given drops (unclear, do not see them in record) and they helped some but not fully. She also took a photo of the ear and noticed there was a tube in there. Hearing has declined Of note, PCP did irrigate ear and she also flushed ear with water.  Does report intermittent psoriasis or eczema over concha bowl This issue occurs intermittently but this time was the worst.  Patient denies: vertigo, tinnitus Patient additionally denies: deep pain in ear canal, eustachian tube symptoms such as popping, crackling, sensitive to pressure changes Patient also denies barotrauma, vestibular suppressant use, ototoxic medication use Prior ear surgery: BTT (as a child) Reports some allergy (seasonal) but denies chronic sinusitis sx (pain, pressure, frequent sinus infections, hyposmia) --------------------------------------------------------------- Follow up 01/13/2024: She is doing very well now. No ear pressure, pain, foul odor, drainage, vertigo. Hearing feels back to baseline -------------------------------------------------------------------- Personal or FHx of bleeding dz or anesthesia difficulty:  no  GLP-1: no AP/AC: no  Tobacco: reports quit around 2017. Lives in [place], [place]  PMHx: Sarcoidosis (including hepatic sarcoid, no advanced fibrosis; and involvement of pulm, skin, joint - followed by Rheum and Pulm), Allergic asthma, Psoriasis   Independent Review of Additional Tests or Records:  Dr. Tanda Rockers (10/26/2023): left ear pain for past several days with decresae hearing. Noted drainage and erythematous TM; Rx: augmentin, ref ENT (also got fluconazole for vaginal irritation) Rhonda Hester (06/2022) - ear concerns, wetness in both ears, no other Sx; periodic nasal congestion; no noted TM findings. Rx: mineral oil, otherwise flonase for AR CBC 11/2023: WBC 7.8, ANC 3300; BMP: no AKI, Glc 186 No prior audios noted CT Head without 2019 independent review: mastoids, ME well aerated; no obvious ossicular or otic capsule path noted; scattered paranasal sinus disease 01/13/2024 Audiogram was independently reviewed and interpreted by me and it reveals A/A tymps b/l, normal hearing b/l  PMH/Meds/All/SocHx/FamHx/ROS:   Past Medical History:  Diagnosis Date   Allergic asthma    Allergy    SEASONAL   Anxiety    Breast mass 05/2017   lt breast lump   Eczema    Fibroid    GERD (gastroesophageal reflux disease)    History of anal fissures    History of Bell's palsy    2006  RIGHT SIDE--  RESOLVED   History of gastroesophageal reflux (GERD)    History of kidney stones    Hyperlipidemia    Interstitial cystitis    Psoriasis    Sarcoidosis of lung (HCC)    DX 2006--  PULMOLOGIST--  DR UJWJ- on 2 liters of oxygen, goes to pain clinic   Seasonal allergic rhinitis    Shortness of breath    Vaginal Pap smear, abnormal  Past Surgical History:  Procedure Laterality Date   CYSTO WITH HYDRODISTENSION N/A 08/24/2013   Procedure: CYSTOSCOPY/HYDRODISTENSION with instillation of marcaine and pyridium;  Surgeon: [name], MD;  Location: [facility];   Service: Urology;  Laterality: N/A;   CYSTOSCOPY/ HYDRODISTENTION/ BLADDER BX  06-09-2010   CYSTOSCOPY/URETEROSCOPY/HOLMIUM LASER/STENT PLACEMENT Right 01/30/2019   Procedure: CYSTOSCOPY/RETROGRADE/URETEROSCOPY/HOLMIUM LASER/STENT PLACEMENT;  Surgeon: Jerilee Field, MD;  Location: [facility];  Service: Urology;  Laterality: Right;   DILATION AND CURETTAGE OF UTERUS  1997   EXTRACORPOREAL SHOCK WAVE LITHOTRIPSY Right 11/30/2018   Procedure: EXTRACORPOREAL SHOCK WAVE LITHOTRIPSY (ESWL);  Surgeon: Alfredo Martinez, MD;  Location: [facility];  Service: Urology;  Laterality: Right;   IR TRANSCATHETER BX  06/14/2018   IR US GUIDE VASC ACCESS RIGHT  06/14/2018   IR VENOGRAM HEPATIC W HEMODYNAMIC EVALUATION  06/14/2018    Family History  Problem Relation Age of Onset   Brain cancer Maternal Grandmother    Hyperlipidemia Other    Sleep apnea Other    Depression Maternal Aunt    Seizures Maternal Uncle    Colon cancer Neg Hx    Stomach cancer Neg Hx      Social Connections: Socially Isolated (09/16/2021)   Social Connection and Isolation Panel [NHANES]    Frequency of Communication with Friends and Family: Once a week    Frequency of Social Gatherings with Friends and Family: Once a week    Attends Religious Services: Never    Database administrator or Organizations: No    Attends Engineer, structural: Never    Marital Status: Never married      Current Outpatient Medications:    Adalimumab 40 MG/0.8ML PNKT, Inject into the skin., Disp: , Rfl:    aluminum-magnesium hydroxide-simethicone (MAALOX) 200-200-20 MG/5ML SUSP, Take 15 mLs by mouth 3 (three) times daily as needed., Disp: 335 mL, Rfl: 0   amoxicillin-clavulanate (AUGMENTIN) 875-125 MG tablet, Take 1 tablet by mouth 2 (two) times daily., Disp: 20 tablet, Rfl: 0   Apremilast (OTEZLA) 30 MG TABS, Take 1 tablet (30 mg total) by mouth 2 (two) times daily., Disp: 60 tablet, Rfl: 3   clobetasol ointment (TEMOVATE) 0.05 %, Apply 1  Application topically 2 (two) times daily. Apply to affected areas twice daily then stop. Alternate with Pimecrolimus cream, Disp: 60 g, Rfl: 2   cyclobenzaprine (FLEXERIL) 5 MG tablet, Take 1 tablet (5 mg total) by mouth 3 (three) times daily as needed., Disp: 30 tablet, Rfl: 0   DULERA 200-5 MCG/ACT AERO, Inhale 2 puffs into the lungs 2 (two) times daily., Disp: , Rfl:    fluconazole (DIFLUCAN) 150 MG tablet, Take 1 tablet (150 mg total) by mouth daily. Take 1 tablet today and take second tablet 3 days later, Disp: 2 tablet, Rfl: 0   hydrOXYzine (ATARAX) 25 MG tablet, Take 1 tablet (25 mg total) by mouth at bedtime. Take one per day. Do not drive while taking, Disp: 30 tablet, Rfl: 3   oxyCODONE (ROXICODONE) 5 MG immediate release tablet, Take 1 tablet (5 mg total) by mouth every 6 (six) hours as needed for up to 6 doses for severe pain or breakthrough pain., Disp: 6 tablet, Rfl: 0   pimecrolimus (ELIDEL) 1 % cream, Apply topically 2 (two) times daily., Disp: 60 g, Rfl: 3   pregabalin (LYRICA) 25 MG capsule, Take 25 mg by mouth 3 (three) times daily., Disp: , Rfl:    ibuprofen (ADVIL) 800 MG tablet, Take 1 tablet (800  mg total) by mouth every 8 (eight) hours as needed (pain)., Disp: 21 tablet, Rfl: 0   pantoprazole (PROTONIX) 40 MG tablet, Take 1 tablet (40 mg total) by mouth daily for 14 days. (Patient not taking: Reported on 07/21/2023), Disp: 14 tablet, Rfl: 0 No current facility-administered medications for this visit.  Facility-Administered Medications Ordered in Other Visits:    bupivacaine (MARCAINE) 0.5 % 15 mL, phenazopyridine (PYRIDIUM) 400 mg bladder mixture, , Bladder Instillation, Once, Jerilee Field, MD   Physical Exam:   BP (!) 139/92 (BP Location: Right Arm, Patient Position: Sitting, Cuff Size: Normal)   Pulse (!) 103   Resp 19   Wt 244 lb (110.7 kg)   SpO2 96%   BMI 38.22 kg/m   Salient findings:  CN II-XII intact Given history and complaints, ear microscopy was  indicated and performed for evaluation with findings as below in physical exam section and in procedures.  B/l EAC patent, TM clear, well aerated ME space Weber 512: mid Rinne 512: AC > BC b/l Anterior rhinoscopy: Septum relatively midline; no purulence noted on anterior rhino or significant lumpy-bumpy mucosa; bilateral inferior turbinates with modest hypertrophy No lesions of oral cavity/oropharynx No obviously palpable neck masses/lymphadenopathy/thyromegaly No respiratory distress or stridor   Seprately Identifiable Procedures:  Procedure: Bilateral ear microscopy using microscope (CPT 92504) Pre-procedure diagnosis: eczematoid otitis externa, otitis externa Post-procedure diagnosis: same Indication: Concern for otitis externa left ear in setting of immunocompromised status; given patient's otologic complaints and history, for improved and comprehensive examination of external ear and tympanic membrane, bilateral otologic examination using microscope was performed  Procedure: Patient was placed semi-recumbent. Both ear canals were examined using the microscope with findings above. No cerumen. Patient tolerated the procedure well.    Impression & Plans:  Rhonda [place] is a 48 y.o. female with history of sarcoidosis and eczema now with: 1. Chronic eczematous otitis externa of both ears   2. Ear fullness, left   3. Subjective hearing loss    Back to baseline after treatment; hearing thresholds normal F/u PRN   See below regarding exact medications prescribed this encounter including dosages and route: No orders of the defined types were placed in this encounter.   Thank you for allowing me the opportunity to care for your patient. Please do not hesitate to contact me should you have any other questions.  Sincerely, Jovita Kussmaul, MD Otolarynoglogist (ENT), [facility] Phone: [phone] Fax: [phone]  01/13/2024, 5:43 PM   MDM:  Level  3 Complexity/Problems addressed: low Data complexity: low - review of tests - Morbidity: low - Prescription Drug prescribed or managed: no

## 2024-01-13 NOTE — Progress Notes (Signed)
  [address] [place], [place] [address] [phone]  Audiological Evaluation    Name: Rhonda [place]     DOB:   20-Oct-1976      MRN:   [id number]                                                                                     Service Date: 01/13/2024     Accompanied by: unaccompanied    Patient comes today after Dr. Allena Katz, ENT sent a referral for a hearing evaluation due to concerns with ear infections( otitis externa).   Symptoms Yes Details  Hearing loss  []    Tinnitus  []    Ear pain/ Ear infections  [x]  Reports she feels better after medical treatment.  Balance problems  []    Noise exposure  []    Previous ear surgeries  []    Family history  []    Amplification  []    Other  []      Otoscopy: Right ear: Clear external ear canals and notable landmarks visualized on the tympanic membrane. Left ear:  Clear external ear canals and notable landmarks visualized on the tympanic membrane.  Tympanometry: Right ear: Type A- Normal external ear canal volume with normal middle ear pressure and tympanic membrane compliance Left ear: Type A- Normal external ear canal volume with normal middle ear pressure and tympanic membrane compliance   Pure tone Audiometry: Normal hearing from [phone] Hz, in both ears.  The hearing test results were completed under headphones and results are deemed to be of good reliability. Test technique:  conventional     Speech Audiometry: Right ear- Speech Reception Threshold (SRT) was obtained at 10 dBHL Left ear-Speech Reception Threshold (SRT) was obtained at 10 dBHL   Word Recognition Score Tested using NU-6 (MLV) Right ear: 100% was obtained at a presentation level of 50 dBHL with contralateral masking which is deemed as  excellent Left ear: 100% was obtained at a presentation level of 50 dBHL with contralateral masking which is deemed as  excellent    Impression: There is not a significant difference in pure-tone  thresholds between ears. There is not a significant difference in the word recognition score in between ears.    Recommendations: Follow up with ENT as scheduled for today. Return for a hearing evaluation if concerns with hearing changes arise or per MD recommendation.   Lillis Nuttle MARIE LEROUX-MARTINEZ, AUD

## 2024-01-13 NOTE — Progress Notes (Deleted)
  [address] [place], [place] [address] [phone]  Audiological Evaluation    Name: Rhonda Hester     DOB:   20-Oct-1976      MRN:   [id number]                                                                                     Service Date: 01/13/2024     Accompanied by: unaccompanied    Patient comes today after Dr. Allena Katz, ENT sent a referral for a hearing evaluation due to concerns with ear infections( otitis externa).   Symptoms Yes Details  Hearing loss  []    Tinnitus  []    Ear pain/ Ear infections  [x]  Reports she feels better after medical treatment.  Balance problems  []    Noise exposure  []    Previous ear surgeries  []    Family history  []    Amplification  []    Other  []      Otoscopy: Right ear: Clear external ear canals and notable landmarks visualized on the tympanic membrane. Left ear:  Clear external ear canals and notable landmarks visualized on the tympanic membrane.  Tympanometry: Right ear: Type A- Normal external ear canal volume with normal middle ear pressure and tympanic membrane compliance Left ear: Type A- Normal external ear canal volume with normal middle ear pressure and tympanic membrane compliance   Pure tone Audiometry: Normal hearing from [phone] Hz, in both ears.  The hearing test results were completed under headphones and results are deemed to be of good reliability. Test technique:  conventional     Speech Audiometry: Right ear- Speech Reception Threshold (SRT) was obtained at 10 dBHL Left ear-Speech Reception Threshold (SRT) was obtained at 10 dBHL   Word Recognition Score Tested using NU-6 (MLV) Right ear: 100% was obtained at a presentation level of 50 dBHL with contralateral masking which is deemed as  excellent Left ear: 100% was obtained at a presentation level of 50 dBHL with contralateral masking which is deemed as  excellent    Impression: There is not a significant difference in pure-tone  thresholds between ears. There is not a significant difference in the word recognition score in between ears.    Recommendations: Follow up with ENT as scheduled for today. Return for a hearing evaluation if concerns with hearing changes arise or per MD recommendation.   Rhonda Hester, AUD

## 2024-01-14 DIAGNOSIS — D860 Sarcoidosis of lung: Secondary | ICD-10-CM | POA: Diagnosis not present

## 2024-01-17 DIAGNOSIS — Z79891 Long term (current) use of opiate analgesic: Secondary | ICD-10-CM | POA: Diagnosis not present

## 2024-01-17 DIAGNOSIS — G894 Chronic pain syndrome: Secondary | ICD-10-CM | POA: Diagnosis not present

## 2024-01-19 DIAGNOSIS — L409 Psoriasis, unspecified: Secondary | ICD-10-CM | POA: Diagnosis not present

## 2024-01-19 DIAGNOSIS — M5136 Other intervertebral disc degeneration, lumbar region with discogenic back pain only: Secondary | ICD-10-CM | POA: Diagnosis not present

## 2024-01-19 DIAGNOSIS — G894 Chronic pain syndrome: Secondary | ICD-10-CM | POA: Diagnosis not present

## 2024-01-26 ENCOUNTER — Ambulatory Visit (INDEPENDENT_AMBULATORY_CARE_PROVIDER_SITE_OTHER): Payer: 59 | Admitting: Nurse Practitioner

## 2024-01-26 ENCOUNTER — Encounter: Payer: Self-pay | Admitting: Nurse Practitioner

## 2024-01-26 VITALS — BP 149/92 | HR 81 | Wt 236.0 lb

## 2024-01-26 DIAGNOSIS — E538 Deficiency of other specified B group vitamins: Secondary | ICD-10-CM | POA: Diagnosis not present

## 2024-01-26 DIAGNOSIS — N898 Other specified noninflammatory disorders of vagina: Secondary | ICD-10-CM

## 2024-01-26 DIAGNOSIS — R300 Dysuria: Secondary | ICD-10-CM

## 2024-01-26 DIAGNOSIS — E559 Vitamin D deficiency, unspecified: Secondary | ICD-10-CM

## 2024-01-26 DIAGNOSIS — M25512 Pain in left shoulder: Secondary | ICD-10-CM | POA: Diagnosis not present

## 2024-01-26 DIAGNOSIS — E782 Mixed hyperlipidemia: Secondary | ICD-10-CM

## 2024-01-26 LAB — POCT URINALYSIS DIP (CLINITEK)
Bilirubin, UA: NEGATIVE
Glucose, UA: 500 mg/dL — AB
Ketones, POC UA: NEGATIVE mg/dL
Leukocytes, UA: NEGATIVE
Nitrite, UA: NEGATIVE
POC PROTEIN,UA: 100 — AB
Spec Grav, UA: >=1.03 — AB (ref 1.010–1.025)
Urobilinogen, UA: 0.2 U/dL
pH, UA: 6 (ref 5.0–8.0)

## 2024-01-26 MED ORDER — FLUCONAZOLE 150 MG PO TABS: 150.0000 mg | ORAL_TABLET | Freq: Every day | ORAL | 0 refills | Status: DC

## 2024-01-26 NOTE — Patient Instructions (Addendum)
1. Mixed hyperlipidemia (Primary)  - Lipid Panel   2. Vaginal irritation  - fluconazole (DIFLUCAN) 150 MG tablet; Take 1 tablet (150 mg total) by mouth daily.  Dispense: 2 tablet; Refill: 0   3. Dysuria  - POCT URINALYSIS DIP (CLINITEK)   4. Vitamin D deficiency  - Vitamin D, 25-hydroxy   5. Vitamin B12 deficiency  - Vitamin B12    Follow up:  Follow up as needed

## 2024-01-26 NOTE — Progress Notes (Signed)
Subjective   Patient ID: Rhonda Hester, female    DOB: 29-Sep-1976, 48 y.o.   MRN: [id number]  Chief Complaint  Patient presents with   Medical Management of Chronic Issues    Referring provider: Ivonne Andrew, NP  Rhonda Hester is a 48 y.o. female with Past Medical History: No date: Allergic asthma No date: Allergy     Comment:  SEASONAL No date: Anxiety 05/2017: Breast mass     Comment:  lt breast lump No date: Eczema No date: Fibroid No date: GERD (gastroesophageal reflux disease) No date: History of anal fissures No date: History of Bell's palsy     Comment:  2006  RIGHT SIDE--  RESOLVED No date: History of gastroesophageal reflux (GERD) No date: History of kidney stones No date: Hyperlipidemia No date: Interstitial cystitis No date: Psoriasis No date: Sarcoidosis of lung (HCC)     Comment:  DX 2006--  PULMOLOGIST--  [facility]- on 2 liters of               oxygen, goes to pain clinic No date: Seasonal allergic rhinitis No date: Shortness of breath No date: Vaginal Pap smear, abnormal   HPI  Patient presents today for follow-up on hyperlipidemia.  We will check lipid panel today. Not currently on medication for cholesterol.  Patient does complain of fatigue.  We will check vitamin B12 level and vitamin D level.  Patient's blood pressure was slightly elevated in office today but she is currently in pain and is scheduled to have a joint injection with orthopedics today at 1:00.  She is also anxious about the injection.  Denies f/c/s, n/v/d, hemoptysis, PND, leg swelling Denies chest pain or edema.    Patient does complain today of vaginal irritation and dysuria.  She has tried vagisil with minimal relief noted. Not currently sexually active. Having vaginal irritation x 1 month.  Will check UA and new swab in office today.     Allergies  Allergen Reactions   Acyclovir And Related Other (See Comments)    Patient states it "made me feel like I can't  breath"    Bee Pollen Itching and Other (See Comments)     coughing   Citric Acid Other (See Comments)    AVOIDS DUE TO INTERSTITIAL CYSTITIS   Dust Mite Extract Itching and Other (See Comments)     coughing   Grapeseed Extract [Nutritional Supplements] Other (See Comments)    Throat itching and burning   Pineapple Itching    Burning in mouth and tingling lips   Pollen Extract Itching and Other (See Comments)     coughing   Nickel Rash    Immunization History  Administered Date(s) Administered   Dtap, Unspecified 03/25/1977, 06/15/1977, 12/02/1977, 04/05/1979, 05/14/1981   HPV 9-valent 11/15/2019   Influenza Whole 09/02/2011, 09/26/2012   Influenza,inj,Quad PF,6+ Mos 12/27/2012, 09/04/2015, 09/20/2017, 08/10/2018, 08/27/2019, 11/20/2019   Influenza-Unspecified 12/27/2012, 09/04/2015, 09/20/2017, 08/10/2018   Measles 04/29/1978   Mumps 04/29/1978   Pneumococcal Conjugate-13 11/14/2018   Pneumococcal Polysaccharide-23 05/10/2016   Polio, Unspecified 03/25/1977, 06/15/1977, 12/02/1977, 04/29/1978, 04/05/1979, 05/14/1981   Rubella 04/29/1978   Tdap 05/10/2016    Tobacco History: Social History   Tobacco Use  Smoking Status Some Days   Current packs/day: 0.00   Average packs/day: 0.3 packs/day for 6.0 years (1.8 ttl pk-yrs)   Types: Cigars, Cigarettes   Start date: 11/2010   Last attempt to quit: 11/2016   Years since quitting: 7.1  Smokeless Tobacco Never  Tobacco Comments   "Smokes 1 black and mild occasionally"   Ready to quit: Not Answered Counseling given: Not Answered Tobacco comments: "Smokes 1 black and mild occasionally"   Outpatient Encounter Medications as of 01/26/2024  Medication Sig   Adalimumab 40 MG/0.8ML PNKT Inject into the skin.   aluminum-magnesium hydroxide-simethicone (MAALOX) 200-200-20 MG/5ML SUSP Take 15 mLs by mouth 3 (three) times daily as needed.   clobetasol ointment (TEMOVATE) 0.05 % Apply 1 Application topically 2 (two) times daily.  Apply to affected areas twice daily then stop. Alternate with Pimecrolimus cream   cyclobenzaprine (FLEXERIL) 5 MG tablet Take 1 tablet (5 mg total) by mouth 3 (three) times daily as needed.   DULERA 200-5 MCG/ACT AERO Inhale 2 puffs into the lungs 2 (two) times daily.   fluconazole (DIFLUCAN) 150 MG tablet Take 1 tablet (150 mg total) by mouth daily.   oxyCODONE (ROXICODONE) 5 MG immediate release tablet Take 1 tablet (5 mg total) by mouth every 6 (six) hours as needed for up to 6 doses for severe pain or breakthrough pain.   pimecrolimus (ELIDEL) 1 % cream Apply topically 2 (two) times daily.   pregabalin (LYRICA) 25 MG capsule Take 25 mg by mouth 3 (three) times daily.   ibuprofen (ADVIL) 800 MG tablet Take 1 tablet (800 mg total) by mouth every 8 (eight) hours as needed (pain). (Patient not taking: Reported on 01/26/2024)   [DISCONTINUED] amoxicillin-clavulanate (AUGMENTIN) 875-125 MG tablet Take 1 tablet by mouth 2 (two) times daily. (Patient not taking: Reported on 01/26/2024)   [DISCONTINUED] Apremilast (OTEZLA) 30 MG TABS Take 1 tablet (30 mg total) by mouth 2 (two) times daily. (Patient not taking: Reported on 01/26/2024)   [DISCONTINUED] famotidine (PEPCID) 20 MG tablet Take 1 tablet (20 mg total) by mouth 2 (two) times daily.   [DISCONTINUED] fluconazole (DIFLUCAN) 150 MG tablet Take 1 tablet (150 mg total) by mouth daily. Take 1 tablet today and take second tablet 3 days later (Patient not taking: Reported on 01/26/2024)   [DISCONTINUED] hydrOXYzine (ATARAX) 25 MG tablet Take 1 tablet (25 mg total) by mouth at bedtime. Take one per day. Do not drive while taking (Patient not taking: Reported on 01/26/2024)   [DISCONTINUED] pantoprazole (PROTONIX) 40 MG tablet Take 1 tablet (40 mg total) by mouth daily for 14 days. (Patient not taking: Reported on 01/26/2024)   Facility-Administered Encounter Medications as of 01/26/2024  Medication   bupivacaine (MARCAINE) 0.5 % 15 mL, phenazopyridine  (PYRIDIUM) 400 mg bladder mixture    Review of Systems  Review of Systems  Constitutional: Negative.   HENT: Negative.    Cardiovascular: Negative.   Gastrointestinal: Negative.   Allergic/Immunologic: Negative.   Neurological: Negative.   Psychiatric/Behavioral: Negative.       Objective:   BP (!) 149/92   Pulse 81   Wt 236 lb (107 kg)   SpO2 98%   BMI 36.96 kg/m   Wt Readings from Last 5 Encounters:  01/26/24 236 lb (107 kg)  01/13/24 244 lb (110.7 kg)  12/13/23 244 lb (110.7 kg)  12/12/23 244 lb 14.9 oz (111.1 kg)  10/26/23 245 lb (111.1 kg)     Physical Exam Vitals and nursing note reviewed.  Constitutional:      General: She is not in acute distress.    Appearance: She is well-developed.  Cardiovascular:     Rate and Rhythm: Normal rate and regular rhythm.  Pulmonary:     Effort: Pulmonary effort is normal.     Breath  sounds: Normal breath sounds.  Neurological:     Mental Status: She is alert and oriented to person, place, and time.       Assessment & Plan:   Mixed hyperlipidemia -     Lipid panel  Vaginal irritation -     Fluconazole; Take 1 tablet (150 mg total) by mouth daily.  Dispense: 2 tablet; Refill: 0  Dysuria -     POCT URINALYSIS DIP (CLINITEK)  Vitamin D deficiency -     VITAMIN D 25 Hydroxy (Vit-D Deficiency, Fractures)  Vitamin B12 deficiency -     Vitamin B12     Return if symptoms worsen or fail to improve.     Ivonne Andrew, NP 01/26/2024

## 2024-01-27 ENCOUNTER — Other Ambulatory Visit: Payer: Self-pay | Admitting: Nurse Practitioner

## 2024-01-27 DIAGNOSIS — Z131 Encounter for screening for diabetes mellitus: Secondary | ICD-10-CM

## 2024-01-27 LAB — VITAMIN D 25 HYDROXY (VIT D DEFICIENCY, FRACTURES): Vit D, 25-Hydroxy: 12.2 ng/mL — ABNORMAL LOW (ref 30.0–100.0)

## 2024-01-27 LAB — LIPID PANEL
Chol/HDL Ratio: 4 {ratio} (ref 0.0–4.4)
Cholesterol, Total: 218 mg/dL — ABNORMAL HIGH (ref 100–199)
HDL: 55 mg/dL (ref >39)
LDL Chol Calc (NIH): 139 mg/dL — ABNORMAL HIGH (ref 0–99)
Triglycerides: 136 mg/dL (ref 0–149)
VLDL Cholesterol Cal: 24 mg/dL (ref 5–40)

## 2024-01-27 LAB — VITAMIN B12: Vitamin B-12: 417 pg/mL (ref 232–1245)

## 2024-01-27 MED ORDER — VITAMIN D (ERGOCALCIFEROL) 1.25 MG (50000 UNIT) PO CAPS: 50000.0000 [IU] | ORAL_CAPSULE | ORAL | 2 refills | Status: AC

## 2024-01-30 ENCOUNTER — Other Ambulatory Visit: Payer: Self-pay | Admitting: Nurse Practitioner

## 2024-02-14 DIAGNOSIS — D860 Sarcoidosis of lung: Secondary | ICD-10-CM | POA: Diagnosis not present

## 2024-02-16 ENCOUNTER — Ambulatory Visit: Payer: 59 | Admitting: Dermatology

## 2024-02-17 DIAGNOSIS — M79671 Pain in right foot: Secondary | ICD-10-CM | POA: Diagnosis not present

## 2024-02-17 DIAGNOSIS — G894 Chronic pain syndrome: Secondary | ICD-10-CM | POA: Diagnosis not present

## 2024-02-17 DIAGNOSIS — M545 Low back pain, unspecified: Secondary | ICD-10-CM | POA: Diagnosis not present

## 2024-02-17 DIAGNOSIS — M25551 Pain in right hip: Secondary | ICD-10-CM | POA: Diagnosis not present

## 2024-02-17 DIAGNOSIS — Z79891 Long term (current) use of opiate analgesic: Secondary | ICD-10-CM | POA: Diagnosis not present

## 2024-02-20 ENCOUNTER — Encounter (HOSPITAL_COMMUNITY): Attending: Nurse Practitioner

## 2024-02-21 ENCOUNTER — Ambulatory Visit: Payer: 59

## 2024-02-21 NOTE — Progress Notes (Unsigned)
 Rhonda Hester

## 2024-02-24 DIAGNOSIS — M19012 Primary osteoarthritis, left shoulder: Secondary | ICD-10-CM | POA: Diagnosis not present

## 2024-02-28 ENCOUNTER — Ambulatory Visit (INDEPENDENT_AMBULATORY_CARE_PROVIDER_SITE_OTHER): Payer: Self-pay | Admitting: Nurse Practitioner

## 2024-02-28 ENCOUNTER — Encounter: Payer: Self-pay | Admitting: Nurse Practitioner

## 2024-02-28 VITALS — BP 135/68 | HR 88 | Temp 97.0°F | Wt 232.0 lb

## 2024-02-28 DIAGNOSIS — R070 Pain in throat: Secondary | ICD-10-CM | POA: Diagnosis not present

## 2024-02-28 DIAGNOSIS — Z309 Encounter for contraceptive management, unspecified: Secondary | ICD-10-CM

## 2024-02-28 DIAGNOSIS — E785 Hyperlipidemia, unspecified: Secondary | ICD-10-CM | POA: Diagnosis not present

## 2024-02-28 DIAGNOSIS — E1165 Type 2 diabetes mellitus with hyperglycemia: Secondary | ICD-10-CM | POA: Insufficient documentation

## 2024-02-28 DIAGNOSIS — R21 Rash and other nonspecific skin eruption: Secondary | ICD-10-CM | POA: Insufficient documentation

## 2024-02-28 DIAGNOSIS — F17290 Nicotine dependence, other tobacco product, uncomplicated: Secondary | ICD-10-CM | POA: Insufficient documentation

## 2024-02-28 DIAGNOSIS — N760 Acute vaginitis: Secondary | ICD-10-CM | POA: Insufficient documentation

## 2024-02-28 LAB — POCT GLYCOSYLATED HEMOGLOBIN (HGB A1C): Hemoglobin A1C: 10.5 % — AB (ref 4.0–5.6)

## 2024-02-28 LAB — POCT URINE PREGNANCY: Preg Test, Ur: NEGATIVE

## 2024-02-28 MED ORDER — BLOOD GLUCOSE TEST VI STRP: 1.0000 | ORAL_STRIP | Freq: Two times a day (BID) | 4 refills | Status: AC

## 2024-02-28 MED ORDER — METFORMIN HCL ER 500 MG PO TB24: 500.0000 mg | ORAL_TABLET | Freq: Two times a day (BID) | ORAL | 3 refills | Status: DC

## 2024-02-28 MED ORDER — LANCET DEVICE MISC: 1.0000 | Freq: Two times a day (BID) | 0 refills | Status: AC

## 2024-02-28 MED ORDER — ROSUVASTATIN CALCIUM 10 MG PO TABS: 10.0000 mg | ORAL_TABLET | Freq: Every day | ORAL | 0 refills | Status: DC

## 2024-02-28 MED ORDER — MEDROXYPROGESTERONE ACETATE 150 MG/ML IM SUSP
150.0000 mg | Freq: Once | INTRAMUSCULAR | Status: AC
  Administered 2024-02-28: 150 mg via INTRAMUSCULAR

## 2024-02-28 MED ORDER — LANCETS MISC. MISC
1.0000 | Freq: Three times a day (TID) | 4 refills | Status: AC
Start: 2024-02-28 — End: 2024-03-29

## 2024-02-28 MED ORDER — BLOOD GLUCOSE MONITORING SUPPL DEVI: 1.0000 | Freq: Two times a day (BID) | 0 refills | Status: AC

## 2024-02-28 NOTE — Assessment & Plan Note (Signed)
 Need to quit smoking discussed

## 2024-02-28 NOTE — Assessment & Plan Note (Signed)
 Patient encouraged to follow-up with dermatologist

## 2024-02-28 NOTE — Progress Notes (Signed)
 Acute Office Visit  Subjective:     Patient ID: Rhonda Hester, female    DOB: [birth year], 48 y.o.   MRN: [id number]  Chief Complaint  Patient presents with   Sore Throat    HPI Rhonda Hester  has a past medical history of Allergic asthma, Allergy, Anxiety, Breast mass (05/2017), Eczema, Fibroid, GERD (gastroesophageal reflux disease), History of anal fissures, History of Bell's palsy, History of gastroesophageal reflux (GERD), History of kidney stones, Hyperlipidemia, Interstitial cystitis, Psoriasis, Sarcoidosis of lung (HCC), Seasonal allergic rhinitis, Shortness of breath, and Vaginal Pap smear, abnormal.   Throat discomfort patient presents with complaints of throat discomfort that she first noticed about 2 months ago states that'' it feels like something is in there''.  She denies trouble swallowing, pain with swallowing.  Smokes 1-2 black a month daily for years  Sarcoidosis has chronic intermittent shortness of breath, wheezing, cough due to her sarcoidosis, states that his symptoms are not worsening followed by pulmonologist at [facility]  Uncontrolled type 2 diabetes.  Currently not on medication.  She denies polyuria polyphagia polydipsia   Rashes.  Patient complains of boils  that occurs intermittently on her axillary area and vaginal area.  States that the boils usually resolve without medication, the boil are painful and at one time had puslike discharge.  This has been going on for about 6 months  Vaginitis.  Patient complains of vaginitis, was recently treated with fluconazole but her symptoms did not resolved.     Review of Systems  Constitutional:  Negative for appetite change, chills, fatigue and fever.  HENT:  Negative for congestion, dental problem, drooling, ear discharge, postnasal drip, rhinorrhea and sneezing.   Respiratory:  Negative for cough, shortness of breath and wheezing.   Cardiovascular:  Negative for chest pain, palpitations and leg swelling.   Gastrointestinal:  Negative for abdominal pain, constipation, nausea and vomiting.  Genitourinary:  Negative for difficulty urinating, dysuria, flank pain and frequency.  Musculoskeletal:  Negative for arthralgias, back pain, joint swelling and myalgias.  Skin:  Positive for rash. Negative for color change, pallor and wound.  Neurological:  Negative for dizziness, facial asymmetry, weakness, numbness and headaches.  Psychiatric/Behavioral:  Negative for behavioral problems, confusion, self-injury and suicidal ideas.         Objective:    BP 135/68   Pulse 88   Temp (!) 97 F (36.1 C)   Wt 232 lb (105.2 kg)   SpO2 98%   BMI 36.34 kg/m    Physical Exam Vitals and nursing note reviewed. Exam conducted with a chaperone present.  Constitutional:      General: She is not in acute distress.    Appearance: Normal appearance. She is obese. She is not ill-appearing, toxic-appearing or diaphoretic.  HENT:     Right Ear: Tympanic membrane and ear canal normal. No drainage, swelling or tenderness. No middle ear effusion. Tympanic membrane is not erythematous.     Left Ear: Tympanic membrane and ear canal normal. No drainage, swelling or tenderness.  No middle ear effusion. Tympanic membrane is not erythematous.     Mouth/Throat:     Mouth: Mucous membranes are moist.     Pharynx: Oropharynx is clear. No oropharyngeal exudate or posterior oropharyngeal erythema.     Tonsils: No tonsillar exudate or tonsillar abscesses. 0 on the right. 0 on the left.  Eyes:     General: No scleral icterus.       Right eye: No discharge.  Left eye: No discharge.     Extraocular Movements: Extraocular movements intact.     Right eye: Normal extraocular motion.     Left eye: Normal extraocular motion.     Conjunctiva/sclera: Conjunctivae normal.  Cardiovascular:     Rate and Rhythm: Normal rate and regular rhythm.     Pulses: Normal pulses.     Heart sounds: Normal heart sounds. No murmur heard.     No friction rub. No gallop.  Pulmonary:     Effort: Pulmonary effort is normal. No respiratory distress.     Breath sounds: Normal breath sounds. No stridor. No wheezing, rhonchi or rales.  Chest:     Chest wall: No tenderness.  Abdominal:     General: There is no distension.     Palpations: Abdomen is soft.     Tenderness: There is no abdominal tenderness. There is no right CVA tenderness, left CVA tenderness or guarding.  Musculoskeletal:        General: No swelling, tenderness, deformity or signs of injury.     Right lower leg: No edema.     Left lower leg: No edema.  Skin:    General: Skin is warm and dry.     Capillary Refill: Capillary refill takes less than 2 seconds.     Coloration: Skin is not jaundiced or pale.     Findings: Rash present. No bruising, erythema or lesion.     Comments: A small mobile mass noted under the right axillary area and left groin area.  No swelling redness or discharge noted.  Neurological:     Mental Status: She is alert and oriented to person, place, and time.     Motor: No weakness.     Coordination: Coordination normal.     Gait: Gait normal.  Psychiatric:        Mood and Affect: Mood normal.        Behavior: Behavior normal.        Thought Content: Thought content normal.        Judgment: Judgment normal.     Results for orders placed or performed in visit on 02/28/24  POCT urine pregnancy  Result Value Ref Range   Preg Test, Ur Negative Negative  POCT glycosylated hemoglobin (Hb A1C)  Result Value Ref Range   Hemoglobin A1C 10.5 (A) 4.0 - 5.6 %   HbA1c POC (<> result, manual entry)     HbA1c, POC (prediabetic range)     HbA1c, POC (controlled diabetic range)          Assessment & Plan:   Problem List Items Addressed This Visit       Endocrine   Type 2 diabetes mellitus with hyperglycemia, without long-term current use of insulin (HCC)   Lab Results  Component Value Date   HGBA1C 10.5 (A) 02/28/2024  Start metformin 500  mg twice daily, Currently not on a statin Patient counseled on low-carb diet, she was referred for diabetic nutrition education CBG goals discussed, glucose meter ordered Checking urine microalbumin labs will benefit from a GLP-1 stated that the information provided today was too much so we will hold off on starting a GLP-1 until next visit Follow-up in 4 weeks      Relevant Medications   metFORMIN (GLUCOPHAGE-XR) 500 MG 24 hr tablet   rosuvastatin (CRESTOR) 10 MG tablet   Other Relevant Orders   Microalbumin / creatinine urine ratio   Ambulatory referral to diabetic education   POCT glycosylated hemoglobin (Hb A1C) (  Completed)     Musculoskeletal and Integument   Rash and other nonspecific skin eruption   Patient encouraged to follow-up with dermatologist        Genitourinary   Acute vaginitis    - NuSwab Vaginitis Plus (VG+)       Relevant Orders   NuSwab Vaginitis Plus (VG+)     Other   Throat discomfort - Primary   Examination of the throat was normal today, patient encouraged to follow-up with ENT      Cigar smoker   Need to quit smoking discussed.      Dyslipidemia, goal LDL below 70   Lab Results  Component Value Date   CHOL 218 (H) 01/26/2024   HDL 55 01/26/2024   LDLCALC 139 (H) 01/26/2024   TRIG 136 01/26/2024   CHOLHDL 4.0 01/26/2024  LDL goal is less than 70, start Crestor 10 mg daily      Relevant Medications   rosuvastatin (CRESTOR) 10 MG tablet   Other Visit Diagnoses       Encounter for contraceptive management, unspecified type       Relevant Medications   medroxyPROGESTERone (DEPO-PROVERA) injection 150 mg (Completed)   Other Relevant Orders   MM 3D SCREENING MAMMOGRAM BILATERAL BREAST   POCT urine pregnancy (Completed)       Meds ordered this encounter  Medications   medroxyPROGESTERone (DEPO-PROVERA) injection 150 mg   metFORMIN (GLUCOPHAGE-XR) 500 MG 24 hr tablet    Sig: Take 1 tablet (500 mg total) by mouth 2 (two) times  daily with a meal.    Dispense:  60 tablet    Refill:  3   Blood Glucose Monitoring Suppl DEVI    Sig: 1 each by Does not apply route 2 (two) times daily. May substitute to any manufacturer covered by patient's insurance.    Dispense:  1 each    Refill:  0   Glucose Blood (BLOOD GLUCOSE TEST STRIPS) STRP    Sig: 1 each by In Vitro route 2 (two) times daily. May substitute to any manufacturer covered by patient's insurance.    Dispense:  200 strip    Refill:  4   Lancet Device MISC    Sig: 1 each by Does not apply route 2 (two) times daily. May substitute to any manufacturer covered by patient's insurance.    Dispense:  1 each    Refill:  0   Lancets Misc. MISC    Sig: 1 each by Does not apply route in the morning, at noon, and at bedtime. May substitute to any manufacturer covered by patient's insurance.    Dispense:  200 each    Refill:  4   rosuvastatin (CRESTOR) 10 MG tablet    Sig: Take 1 tablet (10 mg total) by mouth daily.    Dispense:  90 tablet    Refill:  0    Return in about 4 weeks (around 03/27/2024) for DM.  Donell Beers, FNP

## 2024-02-28 NOTE — Assessment & Plan Note (Signed)
-

## 2024-02-28 NOTE — Assessment & Plan Note (Addendum)
 Lab Results  Component Value Date   HGBA1C 10.5 (A) 02/28/2024  Start metformin 500 mg twice daily, Currently not on a statin Patient counseled on low-carb diet, she was referred for diabetic nutrition education CBG goals discussed, glucose meter ordered Checking urine microalbumin labs will benefit from a GLP-1 stated that the information provided today was too much so we will hold off on starting a GLP-1 until next visit Follow-up in 4 weeks

## 2024-02-28 NOTE — Assessment & Plan Note (Signed)
 Examination of the throat was normal today, patient encouraged to follow-up with ENT

## 2024-02-28 NOTE — Patient Instructions (Addendum)
 Goal for fasting blood sugar ranges from 80 to 120 and 2 hours after any meal or at bedtime should be between 130 to 170.   Please follow-up with your dermatologist and your ENT specialist as discussed   Type 2 diabetes - Microalbumin / creatinine urine ratio - metFORMIN (GLUCOPHAGE-XR) 500 MG 24 hr tablet; Take 1 tablet (500 mg total) by mouth 2 (two) times daily with a meal.  Dispense: 60 tablet; Refill: 3 - Ambulatory referral to diabetic education     It is important that you exercise regularly at least 30 minutes 5 times a week as tolerated  Think about what you will eat, plan ahead. Choose " clean, green, fresh or frozen" over canned, processed or packaged foods which are more sugary, salty and fatty. 70 to 75% of food eaten should be vegetables and fruit. Three meals at set times with snacks allowed between meals, but they must be fruit or vegetables. Aim to eat over a 12 hour period , example 7 am to 7 pm, and STOP after  your last meal of the day. Drink water,generally about 64 ounces per day, no other drink is as healthy. Fruit juice is best enjoyed in a healthy way, by EATING the fruit.  Thanks for choosing [facility] we consider it a privelige to serve you.

## 2024-02-28 NOTE — Assessment & Plan Note (Signed)
 Lab Results  Component Value Date   CHOL 218 (H) 01/26/2024   HDL 55 01/26/2024   LDLCALC 139 (H) 01/26/2024   TRIG 136 01/26/2024   CHOLHDL 4.0 01/26/2024  LDL goal is less than 70, start Crestor 10 mg daily

## 2024-02-29 LAB — MICROALBUMIN / CREATININE URINE RATIO
Creatinine, Urine: 34 mg/dL
Microalb/Creat Ratio: 127 mg/g{creat} — ABNORMAL HIGH (ref 0–29)
Microalbumin, Urine: 43.3 ug/mL

## 2024-03-01 ENCOUNTER — Telehealth: Payer: Self-pay

## 2024-03-01 LAB — NUSWAB VAGINITIS PLUS (VG+)
Candida albicans, NAA: NEGATIVE
Candida glabrata, NAA: NEGATIVE
Chlamydia trachomatis, NAA: NEGATIVE
Neisseria gonorrhoeae, NAA: NEGATIVE
Trich vag by NAA: NEGATIVE

## 2024-03-01 NOTE — Telephone Encounter (Signed)
 Pt advise she did not like how she felt after taking the metformin 500 mg . Please advise . KH

## 2024-03-06 ENCOUNTER — Other Ambulatory Visit: Payer: Self-pay

## 2024-03-06 ENCOUNTER — Ambulatory Visit: Payer: Self-pay | Admitting: Nurse Practitioner

## 2024-03-06 ENCOUNTER — Emergency Department (HOSPITAL_COMMUNITY)
Admission: EM | Admit: 2024-03-06 | Discharge: 2024-03-07 | Disposition: A | Discharge status: Home / Self Care | Attending: Emergency Medicine | Admitting: Emergency Medicine

## 2024-03-06 ENCOUNTER — Emergency Department (HOSPITAL_COMMUNITY)

## 2024-03-06 ENCOUNTER — Encounter (HOSPITAL_COMMUNITY): Payer: Self-pay | Admitting: Emergency Medicine

## 2024-03-06 DIAGNOSIS — R0789 Other chest pain: Secondary | ICD-10-CM | POA: Diagnosis not present

## 2024-03-06 DIAGNOSIS — N200 Calculus of kidney: Secondary | ICD-10-CM | POA: Diagnosis not present

## 2024-03-06 DIAGNOSIS — M25512 Pain in left shoulder: Secondary | ICD-10-CM | POA: Diagnosis not present

## 2024-03-06 DIAGNOSIS — E119 Type 2 diabetes mellitus without complications: Secondary | ICD-10-CM | POA: Insufficient documentation

## 2024-03-06 DIAGNOSIS — Z7984 Long term (current) use of oral hypoglycemic drugs: Secondary | ICD-10-CM | POA: Diagnosis not present

## 2024-03-06 DIAGNOSIS — R109 Unspecified abdominal pain: Secondary | ICD-10-CM | POA: Diagnosis not present

## 2024-03-06 DIAGNOSIS — R079 Chest pain, unspecified: Secondary | ICD-10-CM | POA: Insufficient documentation

## 2024-03-06 LAB — URINALYSIS, W/ REFLEX TO CULTURE (INFECTION SUSPECTED)
Bacteria, UA: NONE SEEN
Bilirubin Urine: NEGATIVE
Glucose, UA: NEGATIVE mg/dL
Ketones, ur: NEGATIVE mg/dL
Nitrite: NEGATIVE
Protein, ur: 100 mg/dL — AB
RBC / HPF: >50 RBC/hpf (ref 0–5)
Specific Gravity, Urine: 1.027 (ref 1.005–1.030)
pH: 5 (ref 5.0–8.0)

## 2024-03-06 LAB — COMPREHENSIVE METABOLIC PANEL
ALT: 19 U/L (ref 0–44)
AST: 17 U/L (ref 15–41)
Albumin: 4 g/dL (ref 3.5–5.0)
Alkaline Phosphatase: 60 U/L (ref 38–126)
Anion gap: 13 (ref 5–15)
BUN: 7 mg/dL (ref 6–20)
CO2: 21 mmol/L — ABNORMAL LOW (ref 22–32)
Calcium: 10 mg/dL (ref 8.9–10.3)
Chloride: 103 mmol/L (ref 98–111)
Creatinine, Ser: 0.7 mg/dL (ref 0.44–1.00)
GFR, Estimated: >60 mL/min (ref >60)
Glucose, Bld: 188 mg/dL — ABNORMAL HIGH (ref 70–99)
Potassium: 3.5 mmol/L (ref 3.5–5.1)
Sodium: 137 mmol/L (ref 135–145)
Total Bilirubin: 1 mg/dL (ref 0.0–1.2)
Total Protein: 7.9 g/dL (ref 6.5–8.1)

## 2024-03-06 LAB — CBC
HCT: 44.5 % (ref 36.0–46.0)
Hemoglobin: 15 g/dL (ref 12.0–15.0)
MCH: 29.2 pg (ref 26.0–34.0)
MCHC: 33.7 g/dL (ref 30.0–36.0)
MCV: 86.6 fL (ref 80.0–100.0)
Platelets: 286 10*3/uL (ref 150–400)
RBC: 5.14 MIL/uL — ABNORMAL HIGH (ref 3.87–5.11)
RDW: 13.7 % (ref 11.5–15.5)
WBC: 9.3 10*3/uL (ref 4.0–10.5)
nRBC: 0 % (ref 0.0–0.2)

## 2024-03-06 LAB — LIPASE, BLOOD: Lipase: 25 U/L (ref 11–51)

## 2024-03-06 LAB — HCG, QUANTITATIVE, PREGNANCY: hCG, Beta Chain, Quant, S: 2 m[IU]/mL (ref <5)

## 2024-03-06 LAB — TROPONIN I (HIGH SENSITIVITY)
Troponin I (High Sensitivity): 4 ng/L (ref <18)
Troponin I (High Sensitivity): 4 ng/L (ref <18)

## 2024-03-06 MED ORDER — IOHEXOL 350 MG/ML SOLN
75.0000 mL | Freq: Once | INTRAVENOUS | Status: AC | PRN
  Administered 2024-03-06: 75 mL via INTRAVENOUS

## 2024-03-06 NOTE — Telephone Encounter (Signed)
 Chief Complaint: Right side/abdominal pain Symptoms: see above Frequency: 2 weeks constant Pertinent Negatives: Patient denies pain with urination, vomiting Disposition: [x] ED /[] Urgent Care (no appt availability in office) / [] Appointment(In office/virtual)/ []  [place] [facility]/ [] Home Care/ [] Refused Recommended Disposition /[] [place] [facility]/ []  Follow-up with PCP Additional Notes: Patient called reporting severe pain 8/10 in right abdomen/side area. Patient states it has been constant and worsening over last two weeks and she cannot find any relief. Patient has been having hot flashes with excessive sweating. Patient states she had diarrhea this morning. Advised patient to go to ED for evaluation to r/o appendicitis. Patient verbalized understanding.    Copied from CRM [id number]. Topic: Clinical - Lab/Test Results >> Mar 06, 2024  4:24 PM Gildardo Pounds wrote: Reason for CRM: Patient is returning a call from office on lab results. Patient is hurting real bad on right side. Pain level 8 right now. Callback number is [phone] Reason for Disposition  [1] SEVERE pain (e.g., excruciating) AND [2] present > 1 hour  Answer Assessment - Initial Assessment Questions 1. LOCATION: "Where does it hurt?"      Ride side/Right abdomen 2. RADIATION: "Does the pain shoot anywhere else?" (e.g., chest, back)     No 3. ONSET: "When did the pain begin?" (e.g., minutes, hours or days ago)      2 weeks  4. SUDDEN: "Gradual or sudden onset?"     Gradual 5. PATTERN "Does the pain come and go, or is it constant?"    - If it comes and goes: "How long does it last?" "Do you have pain now?"     (Note: Comes and goes means the pain is intermittent. It goes away completely between bouts.)    - If constant: "Is it getting better, staying the same, or getting worse?"      (Note: Constant means the pain never goes away completely; most serious pain is constant and gets worse.)      constant 6. SEVERITY:  "How bad is the pain?"  (e.g., Scale 1-10; mild, moderate, or severe)    - MILD (1-3): Doesn't interfere with normal activities, abdomen soft and not tender to touch.     - MODERATE (4-7): Interferes with normal activities or awakens from sleep, abdomen tender to touch.     - SEVERE (8-10): Excruciating pain, doubled over, unable to do any normal activities.       8 7. RECURRENT SYMPTOM: "Have you ever had this type of stomach pain before?" If Yes, ask: "When was the last time?" and "What happened that time?"      Feels similar to kidney stone 8. CAUSE: "What do you think is causing the stomach pain?"     Unsure 9. RELIEVING/AGGRAVATING FACTORS: "What makes it better or worse?" (e.g., antacids, bending or twisting motion, bowel movement)     No 10. OTHER SYMPTOMS: "Do you have any other symptoms?" (e.g., back pain, diarrhea, fever, urination pain, vomiting)       Sweating, diarrhea, nauseated  Protocols used: Abdominal Pain - Female-A-AH

## 2024-03-06 NOTE — ED Triage Notes (Signed)
 Patient complaining of left side chest pain x6 months. Denies radiation and states it is worse with movement. Reports shortness of breath, nausea, and diaphoresis. Patient also complaining of right side flank pain x2 weeks. Denies urinary symptoms.

## 2024-03-06 NOTE — ED Provider Triage Note (Signed)
 Emergency Medicine Provider Triage Evaluation Note  Rhonda Hester , a 48 y.o. female  was evaluated in triage.  Pt complains of chest pain and abdominal pain.  Review of Systems  Positive:  Negative:   Physical Exam  BP 137/84 (BP Location: Right Arm)   Pulse (!) 106   Temp 98.5 F (36.9 C)   Resp 18   Ht 5\' 6"  (1.676 m)   Wt 99.3 kg   SpO2 97%   BMI 35.35 kg/m  Gen:   Awake, no distress   Resp:  Normal effort  MSK:   Moves extremities without difficulty  Other:    Medical Decision Making  Medically screening exam initiated at 7:41 PM.  Appropriate orders placed.  Rhonda Hester was informed that the remainder of the evaluation will be completed by another provider, this initial triage assessment does not replace that evaluation, and the importance of remaining in the ED until their evaluation is complete.  Constant left sided chest pain x6 months. Right sided flank pain x2 weeks. Episode of diarrhea earlier this morning. No recent vomiting.    Rhonda Hester, [place] 03/06/24 [phone]

## 2024-03-07 ENCOUNTER — Ambulatory Visit: Payer: Self-pay | Admitting: Nurse Practitioner

## 2024-03-07 ENCOUNTER — Ambulatory Visit: Payer: 59 | Admitting: Dermatology

## 2024-03-07 DIAGNOSIS — R079 Chest pain, unspecified: Secondary | ICD-10-CM | POA: Diagnosis not present

## 2024-03-07 LAB — URINE CULTURE: Culture: NO GROWTH

## 2024-03-07 MED ORDER — SODIUM CHLORIDE 0.9 % IV SOLN
1.0000 g | Freq: Once | INTRAVENOUS | Status: AC
  Administered 2024-03-07: 1 g via INTRAVENOUS
  Filled 2024-03-07: qty 10

## 2024-03-07 MED ORDER — ONDANSETRON 4 MG PO TBDP: 4.0000 mg | ORAL_TABLET | Freq: Three times a day (TID) | ORAL | 0 refills | Status: DC | PRN

## 2024-03-07 MED ORDER — CEPHALEXIN 500 MG PO CAPS: 500.0000 mg | ORAL_CAPSULE | Freq: Three times a day (TID) | ORAL | 0 refills | Status: DC

## 2024-03-07 MED ORDER — TAMSULOSIN HCL 0.4 MG PO CAPS: 0.4000 mg | ORAL_CAPSULE | Freq: Every day | ORAL | 0 refills | Status: DC

## 2024-03-07 MED ORDER — FENTANYL CITRATE PF 50 MCG/ML IJ SOSY
50.0000 ug | PREFILLED_SYRINGE | Freq: Once | INTRAMUSCULAR | Status: AC
  Administered 2024-03-07: 50 ug via INTRAVENOUS
  Filled 2024-03-07: qty 1

## 2024-03-07 MED ORDER — ONDANSETRON HCL 4 MG/2ML IJ SOLN
4.0000 mg | Freq: Once | INTRAMUSCULAR | Status: AC
  Administered 2024-03-07: 4 mg via INTRAVENOUS
  Filled 2024-03-07: qty 2

## 2024-03-07 NOTE — ED Notes (Signed)
 Pt cleared for po fluids by pa. Pt provided with gingerale

## 2024-03-07 NOTE — Discharge Instructions (Addendum)
 Take the prescribed medication as directed.  Continue your usual pain medication with this. Follow-up with urology-- call for appt. Also recommend to follow-up closely with your primary care doctor. Return to the ED for new or worsening symptoms.

## 2024-03-07 NOTE — ED Provider Notes (Signed)
  EMERGENCY DEPARTMENT AT [facility] Provider Note   CSN: [id number] Arrival date & time: 03/06/24  1919     History  Chief Complaint  Patient presents with   Chest Pain   Flank Pain    Rhonda Hester is a 48 y.o. female.  The history is provided by the patient and medical records.  Chest Pain Flank Pain Associated symptoms include chest pain.   70 old female with history of chronic pain, eczema, GERD, HPV, iron deficiency anemia, diabetes, sarcoidosis, presenting to the ED with multiple complaints.   Chest pain-- ongoing for about 6+ months.  States hurts all day every day.  No new injury/trauma.  Denies SOB, cough, fever, chills, sweats.  Does have hx of arthritis in left shoulder.  No prior cardiac hx.  Occasional smoker.  Flank pain-- ongoing x2 weeks, mostly right sided.  Comes in waves.  Has noticed some hematuria but denies dysuria.  No pelvic pain or vaginal discharge.  Hx of stones in the past requiring laser lithotripsy and basket retrieval several years ago.  Home Medications Prior to Admission medications   Medication Sig Start Date End Date Taking? Authorizing Provider  Adalimumab 40 MG/0.8ML PNKT Inject into the skin. 04/06/22   [provider]  aluminum-magnesium hydroxide-simethicone (MAALOX) 200-200-20 MG/5ML SUSP Take 15 mLs by mouth 3 (three) times daily as needed. 04/07/23   Rising, Lurena Joiner, PA-C  Blood Glucose Monitoring Suppl DEVI 1 each by Does not apply route 2 (two) times daily. May substitute to any manufacturer covered by patient's insurance. 02/28/24   Paseda, Baird Kay, FNP  clobetasol ointment (TEMOVATE) 0.05 % Apply 1 Application topically 2 (two) times daily. Apply to affected areas twice daily then stop. Alternate with Pimecrolimus cream 11/16/23   Terri Piedra, DO  cyclobenzaprine (FLEXERIL) 5 MG tablet Take 1 tablet (5 mg total) by mouth 3 (three) times daily as needed. Patient not taking: Reported on  02/28/2024 12/12/23   Small, Brooke L, PA  DULERA 200-5 MCG/ACT AERO Inhale 2 puffs into the lungs 2 (two) times daily. 03/29/20   [provider]  fluconazole (DIFLUCAN) 150 MG tablet Take 1 tablet (150 mg total) by mouth daily. Patient not taking: Reported on 02/28/2024 01/26/24   Ivonne Andrew, NP  Glucose Blood (BLOOD GLUCOSE TEST STRIPS) STRP 1 each by In Vitro route 2 (two) times daily. May substitute to any manufacturer covered by patient's insurance. 02/28/24 07/12/25  Donell Beers, FNP  Lancet Device MISC 1 each by Does not apply route 2 (two) times daily. May substitute to any manufacturer covered by patient's insurance. 02/28/24 03/29/24  Donell Beers, FNP  Lancets Misc. MISC 1 each by Does not apply route in the morning, at noon, and at bedtime. May substitute to any manufacturer covered by patient's insurance. 02/28/24 03/29/24  Donell Beers, FNP  metFORMIN (GLUCOPHAGE-XR) 500 MG 24 hr tablet Take 1 tablet (500 mg total) by mouth 2 (two) times daily with a meal. 02/28/24   Paseda, Baird Kay, FNP  oxyCODONE (ROXICODONE) 5 MG immediate release tablet Take 1 tablet (5 mg total) by mouth every 6 (six) hours as needed for up to 6 doses for severe pain or breakthrough pain. 07/01/21   Gailen Shelter, PA  pimecrolimus (ELIDEL) 1 % cream Apply topically 2 (two) times daily. 11/16/23   Terri Piedra, DO  pregabalin (LYRICA) 25 MG capsule Take 25 mg by mouth 3 (three) times daily. Patient not taking: Reported  on 02/28/2024 03/18/23   [provider]  rosuvastatin (CRESTOR) 10 MG tablet Take 1 tablet (10 mg total) by mouth daily. 02/28/24   Paseda, Baird Kay, FNP  Vitamin D, Ergocalciferol, (DRISDOL) 1.25 MG (50000 UNIT) CAPS capsule Take 1 capsule (50,000 Units total) by mouth every 7 (seven) days. 01/27/24   Ivonne Andrew, NP  famotidine (PEPCID) 20 MG tablet Take 1 tablet (20 mg total) by mouth 2 (two) times daily. 05/24/19 12/01/20  Mike Gip, FNP      Allergies     Acyclovir and related, Bee pollen, Citric acid, Dust mite extract, Grapeseed extract [nutritional supplements], Pineapple, Pollen extract, and Nickel    Review of Systems   Review of Systems  Cardiovascular:  Positive for chest pain.  Genitourinary:  Positive for flank pain.  All other systems reviewed and are negative.   Physical Exam Updated Vital Signs BP (!) 147/96   Pulse 76   Temp 99.6 F (37.6 C)   Resp 16   Ht 5\' 6"  (1.676 m)   Wt 99.3 kg   SpO2 98%   BMI 35.35 kg/m  Physical Exam Vitals and nursing note reviewed.  Constitutional:      Appearance: She is well-developed.  HENT:     Head: Normocephalic and atraumatic.  Eyes:     Conjunctiva/sclera: Conjunctivae normal.     Pupils: Pupils are equal, round, and reactive to light.  Cardiovascular:     Rate and Rhythm: Normal rate and regular rhythm.     Heart sounds: Normal heart sounds.  Pulmonary:     Effort: Pulmonary effort is normal.     Breath sounds: Normal breath sounds.  Abdominal:     General: Bowel sounds are normal.     Palpations: Abdomen is soft.  Musculoskeletal:        General: Normal range of motion.     Cervical back: Normal range of motion.  Skin:    General: Skin is warm and dry.  Neurological:     Mental Status: She is alert and oriented to person, place, and time.     ED Results / Procedures / Treatments   Labs (all labs ordered are listed, but only abnormal results are displayed) Labs Reviewed  CBC - Abnormal; Notable for the following components:      Result Value   RBC 5.14 (*)    All other components within normal limits  COMPREHENSIVE METABOLIC PANEL - Abnormal; Notable for the following components:   CO2 21 (*)    Glucose, Bld 188 (*)    All other components within normal limits  URINALYSIS, W/ REFLEX TO CULTURE (INFECTION SUSPECTED) - Abnormal; Notable for the following components:   Color, Urine AMBER (*)    APPearance CLOUDY (*)    Hgb urine dipstick LARGE (*)     Protein, ur 100 (*)    Leukocytes,Ua SMALL (*)    All other components within normal limits  URINE CULTURE  HCG, QUANTITATIVE, PREGNANCY  LIPASE, BLOOD  TROPONIN I (HIGH SENSITIVITY)  TROPONIN I (HIGH SENSITIVITY)    EKG EKG Interpretation Date/Time:  Wednesday March 07 2024 02:10:57 EDT Ventricular Rate:  79 PR Interval:  162 QRS Duration:  108 QT Interval:  358 QTC Calculation: 413 R Axis:   58  Text Interpretation: Sinus rhythm Nonspecific T abnormalities, diffuse leads Nonspecific T wave abnormality Confirmed by Glynn Octave [phone]) on 03/07/2024 2:22:10 AM  Radiology CT ABDOMEN PELVIS W CONTRAST Result Date: 03/07/2024 CLINICAL DATA:  Acute abdominal  pain. EXAM: CT ABDOMEN AND PELVIS WITH CONTRAST TECHNIQUE: Multidetector CT imaging of the abdomen and pelvis was performed using the standard protocol following bolus administration of intravenous contrast. RADIATION DOSE REDUCTION: This exam was performed according to the departmental dose-optimization program which includes automated exposure control, adjustment of the mA and/or kV according to patient size and/or use of iterative reconstruction technique. CONTRAST:  75mL OMNIPAQUE IOHEXOL 350 MG/ML SOLN COMPARISON:  CT abdomen and pelvis 07/19/2021 FINDINGS: Lower chest: No acute abnormality. Hepatobiliary: No focal liver abnormality is seen. No gallstones, gallbladder wall thickening, or biliary dilatation. Pancreas: Unremarkable. No pancreatic ductal dilatation or surrounding inflammatory changes. Spleen: Normal in size without focal abnormality. Adrenals/Urinary Tract: There is a 1.3 x 0.6 cm calculus in the right renal pelvis. There is no hydronephrosis. Otherwise, the kidneys, adrenal glands, and bladder are within normal limits. Stomach/Bowel: Stomach is within normal limits. Appendix appears normal. No evidence of bowel wall thickening, distention, or inflammatory changes. Vascular/Lymphatic: No significant vascular findings  are present. No enlarged abdominal or pelvic lymph nodes. Reproductive: Uterine fibroids are present measuring up to 4.9 cm. Adnexa are within normal limits. Other: No abdominal wall hernia or abnormality. No abdominopelvic ascites. Musculoskeletal: No acute or significant osseous findings. IMPRESSION: 1. 1.3 cm calculus in the right renal pelvis without hydronephrosis. 2. Uterine fibroids. Electronically Signed   By: Darliss Cheney M.D.   On: 03/07/2024 00:21   DG Chest 2 View Result Date: 03/06/2024 CLINICAL DATA:  Chest and left shoulder pain. EXAM: CHEST - 2 VIEW COMPARISON:  12/12/2023, 03/02/2023. FINDINGS: The heart size and mediastinal contours are within normal limits. Strandy scarring is noted bilaterally. No consolidation, effusion, or pneumothorax. No acute osseous abnormality is seen. IMPRESSION: Stable chest with no active cardiopulmonary disease. Electronically Signed   By: Thornell Sartorius M.D.   On: 03/06/2024 21:52    Procedures Procedures    Medications Ordered in ED Medications  cefTRIAXone (ROCEPHIN) 1 g in sodium chloride 0.9 % 100 mL IVPB (has no administration in time range)  fentaNYL (SUBLIMAZE) injection 50 mcg (has no administration in time range)  ondansetron (ZOFRAN) injection 4 mg (has no administration in time range)  iohexol (OMNIPAQUE) 350 MG/ML injection 75 mL (75 mLs Intravenous Contrast Given 03/06/24 2228)    ED Course/ Medical Decision Making/ A&P                                 Medical Decision Making Amount and/or Complexity of Data Reviewed Labs: ordered. Radiology: ordered and independent interpretation performed. ECG/medicine tests: ordered and independent interpretation performed.  Risk Prescription drug management.   48 year old female presenting to the ED with multiple complaints.   Chest pain-- constant x6 months. No prior cardiac hx, does have arthritis in her shoulders.  Labs reassuring, trop negative x2.  CXR is clear.  Very low suspicion  for acute cardiac etiology with negative work-up after prolonged symptoms.  May ultimately be MSK given her history. Flank pain-- right sided x2 weeks.  Some hematuria but denies dysuria.  No fever/chills.  UA here with hematuria and small leuks, 21-50 WBCs.  Culture pending.  CT with 1.3cm calculus in right renal pelvis.  Hx of same in the past.  Given dose of IV rocephin and pain/nausea medications here.  Plan to d/c home with keflex, flomax, zofran.  She is already on chronic oxycodone so can continue this.  Given urology follow-up.  Also encouraged to  follow-up with PCP.  Return here for new concerns.  Final Clinical Impression(s) / ED Diagnoses Final diagnoses:  Kidney stone  Chest pain in adult    Rx / DC Orders ED Discharge Orders          Ordered    tamsulosin (FLOMAX) 0.4 MG CAPS capsule  Daily after supper        03/07/24 0525    ondansetron (ZOFRAN-ODT) 4 MG disintegrating tablet  Every 8 hours PRN        03/07/24 0525    cephALEXin (KEFLEX) 500 MG capsule  3 times daily        03/07/24 0525              Garlon Hatchet, PA-C 03/07/24 0559    Glynn Octave, MD 03/07/24 [phone]

## 2024-03-07 NOTE — Telephone Encounter (Signed)
 Spoke to pt and she advise she went to ED and was dx with kidney stones. Will call later for toc follow up appt. KH

## 2024-03-07 NOTE — ED Notes (Signed)
 AVS provided by edp was reviewed with pt. Pt verbalized understanding with no additional questions at this time. Pt provided with urine collection tray, strainer, and specimen cup. Was able to verify pharmacy. IV removed. Pt ambulatory to lobby. Pt reports she is going home with sister Archie Patten.

## 2024-03-07 NOTE — Telephone Encounter (Addendum)
 Red Word that prompted transfer to Nurse Triage: pt went to ER but still having chest pains    Chief Complaint: Chest pain, constant for several months. Seen in ED 03/06/24,"they said there is nothing wrong with my heart. I have a kidney stone."  refuses ED. Asking to see PCP as soon as possible.  Symptoms: Pain Frequency: "Several months." Pertinent Negatives: Patient denies  Disposition: [] ED /[] Urgent Care (no appt availability in office) / [] Appointment(In office/virtual)/ []  [place] [facility]/ [] Home Care/ [x] Refused Recommended Disposition /[] [place] [facility]/ [x]  Follow-up with PCP Additional Notes: Please advise pt. Thanks.  Reason for Disposition  Patient sounds very sick or weak to the triager  Answer Assessment - Initial Assessment Questions 1. LOCATION: "Where does it hurt?"       Middle to left 2. RADIATION: "Does the pain go anywhere else?" (e.g., into neck, jaw, arms, back)     No 3. ONSET: "When did the chest pain begin?" (Minutes, hours or days)      6 months 4. PATTERN: "Does the pain come and go, or has it been constant since it started?"  "Does it get worse with exertion?"      Constant 5. DURATION: "How long does it last" (e.g., seconds, minutes, hours)     Constant 6. SEVERITY: "How bad is the pain?"  (e.g., Scale 1-10; mild, moderate, or severe)    - MILD (1-3): doesn't interfere with normal activities     - MODERATE (4-7): interferes with normal activities or awakens from sleep    - SEVERE (8-10): excruciating pain, unable to do any normal activities       Now - 9 7. CARDIAC RISK FACTORS: "Do you have any history of heart problems or risk factors for heart disease?" (e.g., angina, prior heart attack; diabetes, high blood pressure, high cholesterol, smoker, or strong family history of heart disease)     No 8. PULMONARY RISK FACTORS: "Do you have any history of lung disease?"  (e.g., blood clots in lung, asthma, emphysema, birth control pills)      Sarcoid 9. CAUSE: "What do you think is causing the chest pain?"     Unsure, seen in ED yesterday 10. OTHER SYMPTOMS: "Do you have any other symptoms?" (e.g., dizziness, nausea, vomiting, sweating, fever, difficulty breathing, cough)       No 11. PREGNANCY: "Is there any chance you are pregnant?" "When was your last menstrual period?"       No  Protocols used: Chest Pain-A-AH

## 2024-03-08 ENCOUNTER — Telehealth: Payer: Self-pay

## 2024-03-08 NOTE — Transitions of Care (Post Inpatient/ED Visit) (Signed)
 03/08/2024  Name: Rhonda Hester MRN: [id number] DOB: 19-Apr-1976  Today's TOC FU Call Status:   Patient's Name and Date of Birth confirmed.  Transition Care Management Follow-up Telephone Call Date of Discharge: 03/07/24 Discharge Facility: [facility]) Type of Discharge: Emergency Department Reason for ED Visit: Other: How have you been since you were released from the hospital?: Better Any questions or concerns?: No  Items Reviewed: Did you receive and understand the discharge instructions provided?: No Medications obtained,verified, and reconciled?: Yes (Medications Reviewed)  Medications Reviewed Today: Medications Reviewed Today     Reviewed by Veneta Penton, CMA (Certified Medical Assistant) on 03/08/24 at [phone]  Med List Status: <None>   Medication Order Taking? Sig Documenting Provider Last Dose Status Informant  Adalimumab 40 MG/0.8ML PNKT [id number] Yes Inject into the skin. [provider] Taking Active   aluminum-magnesium hydroxide-simethicone (MAALOX) 200-200-20 MG/5ML SUSP [id number] Yes Take 15 mLs by mouth 3 (three) times daily as needed. Rising, Ray [place] Taking Active   Blood Glucose Monitoring Suppl DEVI [id number] Yes 1 each by Does not apply route 2 (two) times daily. May substitute to any manufacturer covered by patient's insurance. Donell Beers, FNP Taking Active   cephALEXin (KEFLEX) 500 MG capsule [id number] Yes Take 1 capsule (500 mg total) by mouth 3 (three) times daily. Garlon Hatchet, PA-C Taking Active   clobetasol ointment (TEMOVATE) 0.05 % [id number] Yes Apply 1 Application topically 2 (two) times daily. Apply to affected areas twice daily then stop. Alternate with Pimecrolimus cream Terri Piedra, DO Taking Active   cyclobenzaprine (FLEXERIL) 5 MG tablet [id number] No Take 1 tablet (5 mg total) by mouth 3 (three) times daily as needed.  Patient not taking: Reported on 03/08/2024   Pete Pelt, [place] Not Taking Active    DULERA 200-5 MCG/ACT AERO [id number] Yes Inhale 2 puffs into the lungs 2 (two) times daily. [provider] Taking Active Self    Discontinued 12/22/19 1554   fluconazole (DIFLUCAN) 150 MG tablet [id number] No Take 1 tablet (150 mg total) by mouth daily.  Patient not taking: Reported on 03/08/2024   Ivonne Andrew, NP Not Taking Active   Glucose Blood (BLOOD GLUCOSE TEST STRIPS) STRP [id number] Yes 1 each by In Vitro route 2 (two) times daily. May substitute to any manufacturer covered by patient's insurance. Donell Beers, FNP Taking Active   Lancet Device MISC [id number] Yes 1 each by Does not apply route 2 (two) times daily. May substitute to any manufacturer covered by patient's insurance. Donell Beers, FNP Taking Active   Lancets Misc. MISC [id number] Yes 1 each by Does not apply route in the morning, at noon, and at bedtime. May substitute to any manufacturer covered by patient's insurance. Donell Beers, FNP Taking Active   metFORMIN (GLUCOPHAGE-XR) 500 MG 24 hr tablet [id number] Yes Take 1 tablet (500 mg total) by mouth 2 (two) times daily with a meal. Paseda, Baird Kay, FNP Taking Active   ondansetron (ZOFRAN-ODT) 4 MG disintegrating tablet [id number] Yes Take 1 tablet (4 mg total) by mouth every 8 (eight) hours as needed. Garlon Hatchet, PA-C Taking Active   oxyCODONE (ROXICODONE) 5 MG immediate release tablet [id number] Yes Take 1 tablet (5 mg total) by mouth every 6 (six) hours as needed for up to 6 doses for severe pain or breakthrough pain. Gailen Shelter, [place] Taking Active   pimecrolimus (ELIDEL) 1 % cream [id number] Yes Apply topically 2 (two) times daily. Langston Reusing  N, DO Taking Active   pregabalin (LYRICA) 25 MG capsule [id number] No Take 25 mg by mouth 3 (three) times daily.  Patient not taking: Reported on 03/08/2024   [provider] Not Taking Active   rosuvastatin (CRESTOR) 10 MG tablet [id number] Yes Take 1 tablet (10 mg total) by mouth  daily. Donell Beers, FNP Taking Active   tamsulosin (FLOMAX) 0.4 MG CAPS capsule [id number] Yes Take 1 capsule (0.4 mg total) by mouth daily after supper. Garlon Hatchet, PA-C Taking Active   Vitamin D, Ergocalciferol, (DRISDOL) 1.25 MG (50000 UNIT) CAPS capsule [id number] Yes Take 1 capsule (50,000 Units total) by mouth every 7 (seven) days. Ivonne Andrew, NP Taking Active             Home Care and Equipment/Supplies:    Functional Questionnaire: Do you need assistance with bathing/showering or dressing?: No Do you need assistance with meal preparation?: No Do you need assistance with eating?: No Do you have difficulty maintaining continence: No Do you need assistance with getting out of bed/getting out of a chair/moving?: No Do you have difficulty managing or taking your medications?: No  Follow up appointments reviewed: PCP Follow-up appointment confirmed?: Yes Date of PCP follow-up appointment?: 03/30/24 [facility]ist [facility] Follow-up appointment confirmed?: No Do you need transportation to your follow-up appointment?: No Do you understand care options if your condition(s) worsen?: Yes-patient verbalized understanding    SIGNATURE Rhonda Hester, RMA

## 2024-03-13 ENCOUNTER — Emergency Department (HOSPITAL_COMMUNITY): Admission: EM | Admit: 2024-03-13 | Discharge: 2024-03-13 | Disposition: A | Discharge status: Home / Self Care

## 2024-03-13 ENCOUNTER — Encounter (HOSPITAL_COMMUNITY): Payer: Self-pay

## 2024-03-13 ENCOUNTER — Ambulatory Visit: Payer: Self-pay | Admitting: Nurse Practitioner

## 2024-03-13 ENCOUNTER — Emergency Department (HOSPITAL_COMMUNITY)

## 2024-03-13 ENCOUNTER — Other Ambulatory Visit: Payer: Self-pay

## 2024-03-13 DIAGNOSIS — N390 Urinary tract infection, site not specified: Secondary | ICD-10-CM | POA: Insufficient documentation

## 2024-03-13 DIAGNOSIS — R109 Unspecified abdominal pain: Secondary | ICD-10-CM | POA: Diagnosis not present

## 2024-03-13 DIAGNOSIS — D860 Sarcoidosis of lung: Secondary | ICD-10-CM | POA: Diagnosis not present

## 2024-03-13 DIAGNOSIS — N200 Calculus of kidney: Secondary | ICD-10-CM | POA: Insufficient documentation

## 2024-03-13 LAB — BASIC METABOLIC PANEL
Anion gap: 15 (ref 5–15)
BUN: 9 mg/dL (ref 6–20)
CO2: 17 mmol/L — ABNORMAL LOW (ref 22–32)
Calcium: 9.6 mg/dL (ref 8.9–10.3)
Chloride: 106 mmol/L (ref 98–111)
Creatinine, Ser: 0.68 mg/dL (ref 0.44–1.00)
GFR, Estimated: >60 mL/min (ref >60)
Glucose, Bld: 165 mg/dL — ABNORMAL HIGH (ref 70–99)
Potassium: 3.6 mmol/L (ref 3.5–5.1)
Sodium: 138 mmol/L (ref 135–145)

## 2024-03-13 LAB — URINALYSIS, ROUTINE W REFLEX MICROSCOPIC
Bilirubin Urine: NEGATIVE
Glucose, UA: NEGATIVE mg/dL
Ketones, ur: NEGATIVE mg/dL
Nitrite: NEGATIVE
Protein, ur: 30 mg/dL — AB
RBC / HPF: >50 RBC/hpf (ref 0–5)
Specific Gravity, Urine: 1.012 (ref 1.005–1.030)
pH: 6 (ref 5.0–8.0)

## 2024-03-13 LAB — CBC
HCT: 46.1 % — ABNORMAL HIGH (ref 36.0–46.0)
Hemoglobin: 15.2 g/dL — ABNORMAL HIGH (ref 12.0–15.0)
MCH: 29.3 pg (ref 26.0–34.0)
MCHC: 33 g/dL (ref 30.0–36.0)
MCV: 89 fL (ref 80.0–100.0)
Platelets: 278 10*3/uL (ref 150–400)
RBC: 5.18 MIL/uL — ABNORMAL HIGH (ref 3.87–5.11)
RDW: 13.2 % (ref 11.5–15.5)
WBC: 9.7 10*3/uL (ref 4.0–10.5)
nRBC: 0 % (ref 0.0–0.2)

## 2024-03-13 LAB — HCG, SERUM, QUALITATIVE: Preg, Serum: NEGATIVE

## 2024-03-13 MED ORDER — PROMETHAZINE HCL 25 MG PO TABS: 25.0000 mg | ORAL_TABLET | Freq: Four times a day (QID) | ORAL | 0 refills | Status: DC | PRN

## 2024-03-13 MED ORDER — CIPROFLOXACIN HCL 500 MG PO TABS: 500.0000 mg | ORAL_TABLET | Freq: Two times a day (BID) | ORAL | 0 refills | Status: DC

## 2024-03-13 MED ORDER — LACTATED RINGERS IV BOLUS
1000.0000 mL | Freq: Once | INTRAVENOUS | Status: AC
  Administered 2024-03-13: 1000 mL via INTRAVENOUS

## 2024-03-13 MED ORDER — ONDANSETRON HCL 4 MG/2ML IJ SOLN
4.0000 mg | Freq: Once | INTRAMUSCULAR | Status: AC
  Administered 2024-03-13: 4 mg via INTRAVENOUS
  Filled 2024-03-13: qty 2

## 2024-03-13 MED ORDER — KETOROLAC TROMETHAMINE 15 MG/ML IJ SOLN
15.0000 mg | Freq: Once | INTRAMUSCULAR | Status: AC
  Administered 2024-03-13: 15 mg via INTRAVENOUS
  Filled 2024-03-13: qty 1

## 2024-03-13 MED ORDER — MORPHINE SULFATE (PF) 4 MG/ML IV SOLN
4.0000 mg | Freq: Once | INTRAVENOUS | Status: AC
  Administered 2024-03-13: 4 mg via INTRAVENOUS
  Filled 2024-03-13: qty 1

## 2024-03-13 MED ORDER — KETOROLAC TROMETHAMINE 10 MG PO TABS: 10.0000 mg | ORAL_TABLET | Freq: Four times a day (QID) | ORAL | 0 refills | Status: DC | PRN

## 2024-03-13 NOTE — Telephone Encounter (Signed)
 Chief Complaint: Rash from medication/Kidney stone pain Symptoms: see above Frequency: since March 12 Pertinent Negatives: Patient denies fever, throat soreness Disposition: [x] ED /[] Urgent Care (no appt availability in office) / [] Appointment(In office/virtual)/ []  [facility]/ [] Home Care/ [] Refused Recommended Disposition /[] [facility]/ []  Follow-up with PCP Additional Notes: Patient initially called in to ask about how long it takes a kidney stone to pass, however; patient was stating 10/10 pain still from her kidney stones and mentioned she stopped taking the 3 prescribed medications from the hospital (Zofran, Flomax, and Keflex) because one of them caused a rash on the back of her left arm that was itchy. Patient will return to ED for pain control and for re-evaluation of rash and medication. No appt availability for today with primary care providers.    Copied from CRM [id number]. Topic: Clinical - Red Word Triage >> Mar 13, 2024 12:11 PM Rhonda Hester wrote: [facility] that prompted transfer to Nurse Triage: PATIENT HAS QUESTIONS ABOUT KIDNEY STONE Reason for Disposition  Hives or itching  Answer Assessment - Initial Assessment Questions 1. APPEARANCE of RASH: "Describe the rash." (e.g., spots, blisters, raised areas, skin peeling, scaly)     Rash on back of left arm. Red dots 2. SIZE: "How big are the spots?" (e.g., tip of pen, eraser, coin; inches, centimeters)     Red dots and swelling 3. LOCATION: "Where is the rash located?"     Back of left arm 4. COLOR: "What color is the rash?" (Note: It is difficult to assess rash color in people with darker-colored skin. When this situation occurs, simply ask the caller to describe what they see.)     red 5. ONSET: "When did the rash begin?"     When begun taking medication on 12th  6. FEVER: "Do you have a fever?" If Yes, ask: "What is your temperature, how was it measured, and when did it start?"     No 7. ITCHING: "Does  the rash itch?" If Yes, ask: "How bad is the itch?" (Scale 1-10; or mild, moderate, severe)     When taken medication, but has stopped it 8. CAUSE: "What do you think is causing the rash?"     Patient believes to be medication started at hospital 9. NEW MEDICINES: "What new medicines are you taking?" (e.g., name of antibiotic) "When did you start taking this medication?".     Flomax, Zofran, and Keflex 10. OTHER SYMPTOMS: "Do you have any other symptoms?" (e.g., sore throat, fever, joint pain)       Joint pain in right leg yesterday  Protocols used: Rash - Widespread On Drugs-A-AH

## 2024-03-13 NOTE — ED Triage Notes (Signed)
 Pt states when she takes the medications she was given last visit she gets a rash on posterior of left arm, so she stopped taking them. Pt c/o swelling in right lower back started today. Pt c/o urinary frequency.

## 2024-03-13 NOTE — ED Provider Triage Note (Signed)
 Emergency Medicine Provider Triage Evaluation Note  Rhonda Hester , a 48 y.o. female  was evaluated in triage.  Pt complains of Hx of kidney stones Dx on 3/11. Unable to tolerate meds d/t causing rash so stopping taking them.  Review of Systems  Positive: R flank pain/swelling wrapping around to front, frequency, decreased appetite, N/V last night Negative: Fever, chills, HA, CP, SHOB  Physical Exam  BP (!) 117/94 (BP Location: Right Arm)   Pulse (!) 105   Temp 98.1 F (36.7 C) (Oral)   Resp 16   Ht 5\' 6"  (1.676 m)   Wt 99.3 kg   SpO2 98%   BMI 35.33 kg/m  Gen:   Awake, uncomfortable appearing Resp:  Normal effort  MSK:   Moves extremities without difficulty  Other:  R flank pain/swelling  Medical Decision Making  Medically screening exam initiated at 2:37 PM.  Appropriate orders placed.  Rhonda Hester was informed that the remainder of the evaluation will be completed by another provider, this initial triage assessment does not replace that evaluation, and the importance of remaining in the ED until their evaluation is complete.  Labs ordered   Dolphus Jenny, PA-C 03/13/24 1441

## 2024-03-13 NOTE — Discharge Instructions (Signed)
 Please chart the new antibiotic and stop the Keflex.  Try the Phenergan for the nausea.  Take the Toradol for pain.  If you are still having pain is okay to take your home pain medication.  Please call tomorrow to schedule a follow-up appointment with the urologist.  I discussed this with them tonight and they will be expecting your call.  Return to the ER for worsening symptoms.

## 2024-03-13 NOTE — ED Provider Notes (Signed)
 [place] EMERGENCY DEPARTMENT AT [facility] Provider Note   CSN: [id number] Arrival date & time: 03/13/24  1313     History  No chief complaint on file.   Rhonda Hester is a 48 y.o. female.  48 year old female with past medical history of ureteral stones in the past presenting to the emergency department today with worsening right sided flank pain.  The patient is been on antibiotics and she was seen in the emergency department last week.  She has had episodes of nausea and vomiting as well as some chills at home.  She has not checked her temperature.  She states she is having worsening right-sided flank pain.  She thinks that the medications are causing her to have a rash and some itching over her left upper arm.  She came to the ER today for further evaluation regarding this.  She denies any difficulty breathing or swallowing.  Denies any rashes elsewhere.        Home Medications Prior to Admission medications   Medication Sig Start Date End Date Taking? Authorizing Provider  ciprofloxacin (CIPRO) 500 MG tablet Take 1 tablet (500 mg total) by mouth every 12 (twelve) hours. 03/13/24  Yes Durwin Glaze, MD  ketorolac (TORADOL) 10 MG tablet Take 1 tablet (10 mg total) by mouth every 6 (six) hours as needed. 03/13/24  Yes Durwin Glaze, MD  promethazine (PHENERGAN) 25 MG tablet Take 1 tablet (25 mg total) by mouth every 6 (six) hours as needed for nausea or vomiting. 03/13/24  Yes Durwin Glaze, MD  Adalimumab 40 MG/0.8ML PNKT Inject into the skin. 04/06/22   [provider]  aluminum-magnesium hydroxide-simethicone (MAALOX) 200-200-20 MG/5ML SUSP Take 15 mLs by mouth 3 (three) times daily as needed. 04/07/23   Rising, Lurena Joiner, PA-C  Blood Glucose Monitoring Suppl DEVI 1 each by Does not apply route 2 (two) times daily. May substitute to any manufacturer covered by patient's insurance. 02/28/24   Paseda, Baird Kay, FNP  clobetasol ointment (TEMOVATE) 0.05 % Apply 1  Application topically 2 (two) times daily. Apply to affected areas twice daily then stop. Alternate with Pimecrolimus cream 11/16/23   Terri Piedra, DO  cyclobenzaprine (FLEXERIL) 5 MG tablet Take 1 tablet (5 mg total) by mouth 3 (three) times daily as needed. Patient not taking: Reported on 03/08/2024 12/12/23   Small, Brooke L, PA  DULERA 200-5 MCG/ACT AERO Inhale 2 puffs into the lungs 2 (two) times daily. 03/29/20   [provider]  fluconazole (DIFLUCAN) 150 MG tablet Take 1 tablet (150 mg total) by mouth daily. Patient not taking: Reported on 03/08/2024 01/26/24   Ivonne Archie Atilano, NP  Glucose Blood (BLOOD GLUCOSE TEST STRIPS) STRP 1 each by In Vitro route 2 (two) times daily. May substitute to any manufacturer covered by patient's insurance. 02/28/24 07/12/25  Donell Beers, FNP  Lancet Device MISC 1 each by Does not apply route 2 (two) times daily. May substitute to any manufacturer covered by patient's insurance. 02/28/24 03/29/24  Donell Beers, FNP  Lancets Misc. MISC 1 each by Does not apply route in the morning, at noon, and at bedtime. May substitute to any manufacturer covered by patient's insurance. 02/28/24 03/29/24  Donell Beers, FNP  metFORMIN (GLUCOPHAGE-XR) 500 MG 24 hr tablet Take 1 tablet (500 mg total) by mouth 2 (two) times daily with a meal. 02/28/24   Paseda, Baird Kay, FNP  pimecrolimus (ELIDEL) 1 % cream Apply topically 2 (two) times daily.  11/16/23   Terri Piedra, DO  pregabalin (LYRICA) 25 MG capsule Take 25 mg by mouth 3 (three) times daily. Patient not taking: Reported on 03/08/2024 03/18/23   [provider]  rosuvastatin (CRESTOR) 10 MG tablet Take 1 tablet (10 mg total) by mouth daily. 02/28/24   Donell Beers, FNP  tamsulosin (FLOMAX) 0.4 MG CAPS capsule Take 1 capsule (0.4 mg total) by mouth daily after supper. 03/07/24   Garlon Hatchet, PA-C  Vitamin D, Ergocalciferol, (DRISDOL) 1.25 MG (50000 UNIT) CAPS capsule Take 1 capsule  (50,000 Units total) by mouth every 7 (seven) days. 01/27/24   Ivonne Koleton Duchemin, NP  famotidine (PEPCID) 20 MG tablet Take 1 tablet (20 mg total) by mouth 2 (two) times daily. 05/24/19 12/01/20  Mike Gip, FNP      Allergies    Acyclovir and related, Bee pollen, Citric acid, Dust mite extract, Grapeseed extract [nutritional supplements], Pineapple, Pollen extract, and Nickel    Review of Systems   Review of Systems  Gastrointestinal:  Positive for nausea and vomiting.  Genitourinary:  Positive for flank pain.  All other systems reviewed and are negative.   Physical Exam Updated Vital Signs BP 131/83 (BP Location: Left Arm)   Pulse 84   Temp 98.1 F (36.7 C) (Oral)   Resp 18   Ht 5\' 6"  (1.676 m)   Wt 99.3 kg   SpO2 100%   BMI 35.33 kg/m  Physical Exam Vitals and nursing note reviewed.   Gen: Appears uncomfortable Eyes: PERRL, EOMI HEENT: no oropharyngeal swelling Neck: trachea midline Resp: clear to auscultation bilaterally Card: RRR, no murmurs, rubs, or gallops Abd: Right-sided CVA tenderness noted Extremities: no calf tenderness, no edema Vascular: 2+ radial pulses bilaterally, 2+ DP pulses bilaterally Neuro: No focal deficits Skin: no rashes Psyc: acting appropriately   ED Results / Procedures / Treatments   Labs (all labs ordered are listed, but only abnormal results are displayed) Labs Reviewed  URINALYSIS, ROUTINE W REFLEX MICROSCOPIC - Abnormal; Notable for the following components:      Result Value   APPearance HAZY (*)    Hgb urine dipstick MODERATE (*)    Protein, ur 30 (*)    Leukocytes,Ua MODERATE (*)    Bacteria, UA RARE (*)    All other components within normal limits  BASIC METABOLIC PANEL - Abnormal; Notable for the following components:   CO2 17 (*)    Glucose, Bld 165 (*)    All other components within normal limits  CBC - Abnormal; Notable for the following components:   RBC 5.18 (*)    Hemoglobin 15.2 (*)    HCT 46.1 (*)    All  other components within normal limits  URINE CULTURE  HCG, SERUM, QUALITATIVE    EKG None  Radiology CT ABDOMEN PELVIS WO CONTRAST Result Date: 03/13/2024 CLINICAL DATA:  Abdominal and flank pain EXAM: CT ABDOMEN AND PELVIS WITHOUT CONTRAST TECHNIQUE: Multidetector CT imaging of the abdomen and pelvis was performed following the standard protocol without IV contrast. RADIATION DOSE REDUCTION: This exam was performed according to the departmental dose-optimization program which includes automated exposure control, adjustment of the mA and/or kV according to patient size and/or use of iterative reconstruction technique. COMPARISON:  03/06/2024 FINDINGS: Lower chest: No acute pleural or parenchymal lung disease. Hepatobiliary: Heterogeneous decreased liver attenuation consistent with hepatic steatosis. No biliary duct dilation. The gallbladder is unremarkable. Pancreas: Unremarkable unenhanced appearance. Spleen: Unremarkable unenhanced appearance. Adrenals/Urinary Tract: 16 mm nonobstructing calculus within  the right renal pelvis, stable. No left-sided calculi. No obstructive uropathy within either kidney. Bladder is decompressed, limiting its evaluation. The adrenals are unremarkable. Stomach/Bowel: No bowel obstruction or ileus. Normal appendix right lower quadrant. No bowel wall thickening or inflammatory change. Vascular/Lymphatic: No significant vascular findings are present. No enlarged abdominal or pelvic lymph nodes. Reproductive: Stable fibroid uterus.  No adnexal masses. Other: No free fluid or free intraperitoneal gas. No abdominal wall hernia. Musculoskeletal: No acute or destructive bony abnormalities. Reconstructed images demonstrate no additional findings. IMPRESSION: 1. Stable nonobstructing 16 mm right renal calculus. 2. Fibroid uterus. Electronically Signed   By: Sharlet Salina M.D.   On: 03/13/2024 20:35    Procedures Procedures    Medications Ordered in ED Medications  morphine  (PF) 4 MG/ML injection 4 mg (4 mg Intravenous Given 03/13/24 1912)  ondansetron (ZOFRAN) injection 4 mg (4 mg Intravenous Given 03/13/24 1908)  lactated ringers bolus 1,000 mL (0 mLs Intravenous Stopped 03/13/24 2053)  ketorolac (TORADOL) 15 MG/ML injection 15 mg (15 mg Intravenous Given 03/13/24 2114)    ED Course/ Medical Decision Making/ A&P                                 Medical Decision Making 48 year old female with past medical history of ureteral stones in the past presenting to the emergency department today with right-sided flank pain, nausea, and vomiting.  Looking back does appear she had a stone in the renal pelvis when she was seen here last week but it was not causing any obstructive uropathy.  I will further evaluate the patient here with labs and repeat urinalysis.  Will also obtain a CT scan to evaluate for ureteral stone.  I will give the patient morphine and Zofran as well as IV fluids and reevaluate for ultimate disposition.  The patient's labs are reassuring.  CT scan is stable.  I did call and discussed this with Dr. Alvester Morin.  He recommends outpatient follow-up.  Recommend switching the patient to ciprofloxacin which was done.  The patient is feeling better on reassessment after the Toradol.  She is discharged with return precautions.  Amount and/or Complexity of Data Reviewed Labs: ordered. Radiology: ordered.  Risk Prescription drug management.           Final Clinical Impression(s) / ED Diagnoses Final diagnoses:  Nephrolithiasis  Urinary tract infection with hematuria, site unspecified    Rx / DC Orders ED Discharge Orders          Ordered    ketorolac (TORADOL) 10 MG tablet  Every 6 hours PRN        03/13/24 2207    promethazine (PHENERGAN) 25 MG tablet  Every 6 hours PRN        03/13/24 2207    ciprofloxacin (CIPRO) 500 MG tablet  Every 12 hours        03/13/24 2207              Durwin Glaze, MD 03/13/24 2209

## 2024-03-14 ENCOUNTER — Telehealth: Payer: Self-pay

## 2024-03-14 ENCOUNTER — Other Ambulatory Visit: Payer: Self-pay | Admitting: Urology

## 2024-03-14 DIAGNOSIS — R1084 Generalized abdominal pain: Secondary | ICD-10-CM | POA: Diagnosis not present

## 2024-03-14 DIAGNOSIS — N200 Calculus of kidney: Secondary | ICD-10-CM | POA: Diagnosis not present

## 2024-03-14 NOTE — Transitions of Care (Post Inpatient/ED Visit) (Signed)
   03/14/2024  Name: Rhonda Hester MRN: [id number] DOB: [birth year]  Today's TOC FU Call Status:    Attempted to reach the patient regarding the most recent Inpatient/ED visit.  Follow Up Plan: Additional outreach attempts will be made to reach the patient to complete the Transitions of Care (Post Inpatient/ED visit) call.   Signature  [employer], [name]

## 2024-03-14 NOTE — Telephone Encounter (Signed)
 Pt was called for TOC call . She will be called back. KH

## 2024-03-15 ENCOUNTER — Telehealth: Payer: Self-pay

## 2024-03-15 DIAGNOSIS — Z79891 Long term (current) use of opiate analgesic: Secondary | ICD-10-CM | POA: Diagnosis not present

## 2024-03-15 DIAGNOSIS — M25551 Pain in right hip: Secondary | ICD-10-CM | POA: Diagnosis not present

## 2024-03-15 DIAGNOSIS — G894 Chronic pain syndrome: Secondary | ICD-10-CM | POA: Diagnosis not present

## 2024-03-15 DIAGNOSIS — M25562 Pain in left knee: Secondary | ICD-10-CM | POA: Diagnosis not present

## 2024-03-15 DIAGNOSIS — M79671 Pain in right foot: Secondary | ICD-10-CM | POA: Diagnosis not present

## 2024-03-15 LAB — URINE CULTURE: Culture: NO GROWTH

## 2024-03-15 NOTE — Transitions of Care (Post Inpatient/ED Visit) (Cosign Needed)
 03/15/2024  Name: Rhonda Hester MRN: [id number] DOB: Jan 09, 1976  Today's TOC FU Call Status: Today's TOC FU Call Status:: Successful TOC FU Call Completed Patient's Name and Date of Birth confirmed.  Transition Care Management Follow-up Telephone Call Date of Discharge: 03/13/24 Discharge Facility: [facility]) Type of Discharge: Emergency Department Reason for ED Visit: Other: How have you been since you were released from the hospital?: Same Any questions or concerns?: No  Items Reviewed: Did you receive and understand the discharge instructions provided?: No Medications obtained,verified, and reconciled?: Yes (Medications Reviewed) Any new allergies since your discharge?: No Dietary orders reviewed?: NA Do you have support at home?: Yes  Medications Reviewed Today: Medications Reviewed Today     Reviewed by Renelda Loma, RMA (Registered Medical Assistant) on 03/15/24 at 1018  Med List Status: <None>   Medication Order Taking? Sig Documenting Provider Last Dose Status Informant  Adalimumab 40 MG/0.8ML PNKT [id number] Yes Inject into the skin. [provider] Taking Active   aluminum-magnesium hydroxide-simethicone (MAALOX) 200-200-20 MG/5ML SUSP [id number] Yes Take 15 mLs by mouth 3 (three) times daily as needed. Rising, [place] Taking Active   Blood Glucose Monitoring Suppl DEVI [id number] Yes 1 each by Does not apply route 2 (two) times daily. May substitute to any manufacturer covered by patient's insurance. Donell Beers, FNP Taking Active   ciprofloxacin (CIPRO) 500 MG tablet [id number] No Take 1 tablet (500 mg total) by mouth every 12 (twelve) hours.  Patient not taking: Reported on 03/15/2024   Durwin Glaze, MD Not Taking Active   clobetasol ointment (TEMOVATE) 0.05 % [id number] Yes Apply 1 Application topically 2 (two) times daily. Apply to affected areas twice daily then stop. Alternate with Pimecrolimus cream Terri Piedra, DO Taking  Active   cyclobenzaprine (FLEXERIL) 5 MG tablet [id number] No Take 1 tablet (5 mg total) by mouth 3 (three) times daily as needed.  Patient not taking: Reported on 02/28/2024   Pete Pelt, [place] Not Taking Active   DULERA 200-5 MCG/ACT AERO [id number] Yes Inhale 2 puffs into the lungs 2 (two) times daily. [provider] Taking Active Self    Discontinued 12/22/19 1554   fluconazole (DIFLUCAN) 150 MG tablet [id number]  Take 1 tablet (150 mg total) by mouth daily.  Patient not taking: Reported on 03/08/2024   Ivonne Andrew, NP  Active   Glucose Blood (BLOOD GLUCOSE TEST STRIPS) STRP [id number] Yes 1 each by In Vitro route 2 (two) times daily. May substitute to any manufacturer covered by patient's insurance. Donell Beers, FNP Taking Active   ketorolac (TORADOL) 10 MG tablet [id number] Yes Take 1 tablet (10 mg total) by mouth every 6 (six) hours as needed. Durwin Glaze, MD Taking Active   Lancet Device MISC [id number] Yes 1 each by Does not apply route 2 (two) times daily. May substitute to any manufacturer covered by patient's insurance. Donell Beers, FNP Taking Active   Lancets Misc. MISC [id number] Yes 1 each by Does not apply route in the morning, at noon, and at bedtime. May substitute to any manufacturer covered by patient's insurance. Donell Beers, FNP Taking Active   metFORMIN (GLUCOPHAGE-XR) 500 MG 24 hr tablet [id number] No Take 1 tablet (500 mg total) by mouth 2 (two) times daily with a meal.  Patient not taking: Reported on 03/15/2024   Donell Beers, FNP Not Taking Active   pimecrolimus (ELIDEL) 1 % cream [id number] Yes Apply topically 2 (two) times daily. Onalee Hua,  Gregery Na, DO Taking Active   pregabalin (LYRICA) 25 MG capsule [id number] No Take 25 mg by mouth 3 (three) times daily.  Patient not taking: Reported on 02/28/2024   [provider] Not Taking Active   promethazine (PHENERGAN) 25 MG tablet [id number] No Take 1 tablet (25 mg total) by mouth  every 6 (six) hours as needed for nausea or vomiting.  Patient not taking: Reported on 03/15/2024   Durwin Glaze, MD Not Taking Active   rosuvastatin (CRESTOR) 10 MG tablet [id number] Yes Take 1 tablet (10 mg total) by mouth daily. Donell Beers, FNP Taking Active   tamsulosin (FLOMAX) 0.4 MG CAPS capsule [id number] No Take 1 capsule (0.4 mg total) by mouth daily after supper.  Patient not taking: Reported on 03/15/2024   Garlon Hatchet, PA-C Not Taking Active   Vitamin D, Ergocalciferol, (DRISDOL) 1.25 MG (50000 UNIT) CAPS capsule [id number] Yes Take 1 capsule (50,000 Units total) by mouth every 7 (seven) days. Ivonne Andrew, NP Taking Active             Home Care and Equipment/Supplies: Were Home Health Services Ordered?: NA Any new equipment or medical supplies ordered?: NA  Functional Questionnaire: Do you need assistance with bathing/showering or dressing?: No Do you need assistance with meal preparation?: No Do you need assistance with eating?: No Do you have difficulty maintaining continence: No Do you need assistance with getting out of bed/getting out of a chair/moving?: No Do you have difficulty managing or taking your medications?: No  Follow up appointments reviewed: PCP Follow-up appointment confirmed?: No Specialist Hospital Follow-up appointment confirmed?: Yes Date of Specialist follow-up appointment?: 03/22/24 Follow-Up Specialty Provider:: Urology Do you need transportation to your follow-up appointment?: No  SDOH Interventions Today    Flowsheet Row Most Recent Value  SDOH Interventions   Food Insecurity Interventions Intervention Not Indicated  Housing Interventions Intervention Not Indicated  Transportation Interventions Intervention Not Indicated  Utilities Interventions Intervention Not Indicated       SIGNATURE  Renelda Loma RMA

## 2024-03-16 ENCOUNTER — Ambulatory Visit: Admitting: Dietician

## 2024-03-20 NOTE — Progress Notes (Addendum)
 Patient interviewed over the phone. Emailed instructions to email. Attempted to transfer call to admitting but patient stated she did not want to talk to them right now. She said a better time would be this afternoon. Let admitting know.  COVID Vaccine Completed: yes  Date of COVID positive in last 90 days: no  PCP - Angus Seller, NP Cardiologist - Donato Schultz, MD LOV 02/22/23 for chest pain  Chest x-ray - 03/06/24 Epic EKG - 03/07/24 Epic Stress Test - n/a ECHO - 03/23/23 Epic Cardiac Cath - n/a Pacemaker/ICD device last checked: n/a Spinal Cord Stimulator: n/a  Bowel Prep - no  Sleep Study - n/a CPAP -   Fasting Blood Sugar - 140s Checks Blood Sugar 2-3 times a day  Last dose of GLP1 agonist-  N/A GLP1 instructions:  Hold 7 days before surgery    Last dose of SGLT-2 inhibitors-  N/A SGLT-2 instructions:  Hold 3 days before surgery    Blood Thinner Instructions:  Last dose: n/a  Time: Aspirin Instructions: Last Dose:  Activity level: Can go up a flight of stairs and perform activities of daily living without stopping and without symptoms of chest pain. SOB, not new per pt has sarcoidosis    Anesthesia review: asthma, sarcoidosis, Dm2, SOB, A1C 10.5, heart murmur as a child  Patient denies shortness of breath, fever, cough and chest pain at PAT appointment  Patient verbalized understanding of instructions that were given to them at the PAT appointment. Patient was also instructed that they will need to review over the PAT instructions again at home before surgery.

## 2024-03-20 NOTE — Patient Instructions (Signed)
 SURGICAL WAITING ROOM VISITATION  Patients having surgery or a procedure may have no more than 2 support people in the waiting area - these visitors may rotate.    Children under the age of 40 must have an adult with them who is not the patient.  Due to an increase in RSV and influenza rates and associated hospitalizations, children ages 53 and under may not visit patients in [facility] hospitals.  Visitors with respiratory illnesses are discouraged from visiting and should remain at home.  If the patient needs to stay at the hospital during part of their recovery, the visitor guidelines for inpatient rooms apply. Pre-op nurse will coordinate an appropriate time for 1 support person to accompany patient in pre-op.  This support person may not rotate.    Please refer to the [facility] website for the visitor guidelines for Inpatients (after your surgery is over and you are in a regular room).    Your procedure is scheduled on: 03/22/24   Report to [facility] Main Entrance    Report to admitting at 6:15 AM   Call this number if you have problems the morning of surgery [phone]   Do not eat food or drink liquids :After Midnight.          If you have questions, please contact your surgeon's office.   FOLLOW BOWEL PREP AND ANY ADDITIONAL PRE OP INSTRUCTIONS YOU RECEIVED FROM YOUR SURGEON'S OFFICE!!!     Oral Hygiene is also important to reduce your risk of infection.                                    Remember - BRUSH YOUR TEETH THE MORNING OF SURGERY WITH YOUR REGULAR TOOTHPASTE  DENTURES WILL BE REMOVED PRIOR TO SURGERY PLEASE DO NOT APPLY "Poly grip" OR ADHESIVES!!!   Do NOT smoke after Midnight   Stop all vitamins and herbal supplements 7 days before surgery.   Take these medicines the morning of surgery with A SIP OF WATER: Inhalers, Oxycodone   DO NOT TAKE ANY ORAL DIABETIC MEDICATIONS DAY OF YOUR SURGERY  How to Manage Your Diabetes Before and After  Surgery  Why is it important to control my blood sugar before and after surgery? Improving blood sugar levels before and after surgery helps healing and can limit problems. A way of improving blood sugar control is eating a healthy diet by:  Eating less sugar and carbohydrates  Increasing activity/exercise  Talking with your doctor about reaching your blood sugar goals High blood sugars (greater than 180 mg/dL) can raise your risk of infections and slow your recovery, so you will need to focus on controlling your diabetes during the weeks before surgery. Make sure that the doctor who takes care of your diabetes knows about your planned surgery including the date and location.  How do I manage my blood sugar before surgery? Check your blood sugar at least 4 times a day, starting 2 days before surgery, to make sure that the level is not too high or low. Check your blood sugar the morning of your surgery when you wake up and every 2 hours until you get to the Short Stay unit. If your blood sugar is less than 70 mg/dL, you will need to treat for low blood sugar: Do not take insulin. Treat a low blood sugar (less than 70 mg/dL) with  cup of clear juice (cranberry or  apple), 4 glucose tablets, OR glucose gel. Recheck blood sugar in 15 minutes after treatment (to make sure it is greater than 70 mg/dL). If your blood sugar is not greater than 70 mg/dL on recheck, call [phone] for further instructions. Report your blood sugar to the short stay nurse when you get to Short Stay.  If you are admitted to the hospital after surgery: Your blood sugar will be checked by the staff and you will probably be given insulin after surgery (instead of oral diabetes medicines) to make sure you have good blood sugar levels. The goal for blood sugar control after surgery is 80-180 mg/dL.   WHAT DO I DO ABOUT MY DIABETES MEDICATION?  Do not take oral diabetes medicines (pills) the morning of surgery.  THE DAY  BEFORE SURGERY, take Metformin as prescribed.      THE MORNING OF SURGERY, do not take Metformin.  Reviewed and Endorsed by [facility] Patient Education Committee, August 2015                              You may not have any metal on your body including hair pins, jewelry, and body piercing             Do not wear make-up, lotions, powders, perfumes, or deodorant  Do not wear nail polish including gel and S&S, artificial/acrylic nails, or any other type of covering on natural nails including finger and toenails. If you have artificial nails, gel coating, etc. that needs to be removed by a nail salon please have this removed prior to surgery or surgery may need to be canceled/ delayed if the surgeon/ anesthesia feels like they are unable to be safely monitored.   Do not shave  48 hours prior to surgery.    Do not bring valuables to the hospital. [facility] IS NOT             RESPONSIBLE   FOR VALUABLES.   Contacts, glasses, dentures or bridgework may not be worn into surgery.  DO NOT BRING YOUR HOME MEDICATIONS TO THE HOSPITAL. PHARMACY WILL DISPENSE MEDICATIONS LISTED ON YOUR MEDICATION LIST TO YOU DURING YOUR ADMISSION IN THE HOSPITAL!    Patients discharged on the day of surgery will not be allowed to drive home.  Someone NEEDS to stay with you for the first 24 hours after anesthesia.              Please read over the following fact sheets you were given: IF YOU HAVE QUESTIONS ABOUT YOUR PRE-OP INSTRUCTIONS PLEASE CALL [phone] [place]ras    If you received a COVID test during your pre-op visit  it is requested that you wear a mask when out in public, stay away from anyone that may not be feeling well and notify your surgeon if you develop symptoms. If you test positive for Covid or have been in contact with anyone that has tested positive in the last 10 days please notify you surgeon.    [facility] - Preparing for Surgery Before surgery, you can play an important role.  Because  skin is not sterile, your skin needs to be as free of germs as possible.  You can reduce the number of germs on your skin by washing with CHG (chlorahexidine gluconate) soap before surgery.  CHG is an antiseptic cleaner which kills germs and bonds with the skin to continue killing germs even after washing. Please DO NOT use  if you have an allergy to CHG or antibacterial soaps.  If your skin becomes reddened/irritated stop using the CHG and inform your nurse when you arrive at Short Stay. Do not shave (including legs and underarms) for at least 48 hours prior to the first CHG shower.  You may shave your face/neck.  Please follow these instructions carefully:  1.  Shower with CHG Soap the night before surgery and the  morning of surgery.  2.  If you choose to wash your hair, wash your hair first as usual with your normal  shampoo.  3.  After you shampoo, rinse your hair and body thoroughly to remove the shampoo.                             4.  Use CHG as you would any other liquid soap.  You can apply chg directly to the skin and wash.  Gently with a scrungie or clean washcloth.  5.  Apply the CHG Soap to your body ONLY FROM THE NECK DOWN.   Do   not use on face/ open                           Wound or open sores. Avoid contact with eyes, ears mouth and   genitals (private parts).                       Wash face,  Genitals (private parts) with your normal soap.             6.  Wash thoroughly, paying special attention to the area where your    surgery  will be performed.  7.  Thoroughly rinse your body with warm water from the neck down.  8.  DO NOT shower/wash with your normal soap after using and rinsing off the CHG Soap.                9.  Pat yourself dry with a clean towel.            10.  Wear clean pajamas.            11.  Place clean sheets on your bed the night of your first shower and do not  sleep with pets. Day of Surgery : Do not apply any lotions/deodorants the morning of surgery.   Please wear clean clothes to the hospital/surgery center.  FAILURE TO FOLLOW THESE INSTRUCTIONS MAY RESULT IN THE CANCELLATION OF YOUR SURGERY  PATIENT SIGNATURE_________________________________  NURSE SIGNATURE__________________________________  ________________________________________________________________________

## 2024-03-20 NOTE — H&P (Signed)
 CC/HPI: cc: Urolithiasis   03/14/2024: 48 year old woman with a history of urolithiasis here for ER follow-up after CT showed a 16mm right renal pelvis calculus. No fever or chills. She has previously undergone ESWL and ureteroscopy. UA from the ED showed rare bacteria and neg nitrate. Urine cx from 03/06/24 is negative.     ALLERGIES: No Known Allergies    MEDICATIONS: Metformin Hcl 500 mg tablet  Tamsulosin Hcl 0.4 mg capsule  Cephalexin 500 mg tablet  Dulera 100 mcg-5 mcg/actuation hfa aerosol with adapter  Humira Pen  Ondansetron Hcl  Rosuvastatin Calcium 10 mg tablet     GU PSH: Cysto Bladder Ureth Biopsy - 2011 Cystoscopy Hydrodistention - 2014, 2011 D&C Non-OB - 2011 ESWL, Right - 2019 Ureteroscopic laser litho, Right - 2020       PSH Notes: Cystoscopy With Dilation Of Bladder, Cystoscopy With Dilation Of Bladder, Cystoscopy With Biopsy, Dilation And Curettage   NON-GU PSH: No Non-GU PSH    GU PMH: Renal calculus - 2020, - 2019, - 2019 Interstitial Cystitis (w/o hematuria), Interstitial cystitis - 2017 Oth GU systems Signs/Symptoms, Bladder pain - 2017 Other microscopic hematuria, Microscopic hematuria - 2017 Urinary Tract Inf, Unspec site, Suspected urinary tract infection - 2017 Pelvic/perineal pain, Abdominal pain, suprapubic - 2014 Urinary Frequency, Increased urinary frequency - 2014      PMH Notes:  2010-06-04 14:37:17 - Note: No Medical Problems  Kidney stones   NON-GU PMH: Encounter for general adult medical examination without abnormal findings, Encounter for preventive health examination - 2017 Anxiety, Anxiety (Symptom) - 2014 Muscle weakness (generalized), Muscle weakness - 2014 Other lack of coordination, Other lack of coordination - 2014 Other muscle spasm, Muscle spasm - 2014 Asthma GERD Sarcoidosis, unspecified    FAMILY HISTORY: Family Health Status - Mother's Age - Runs In Family Family Health Status Number - No Family History   SOCIAL  HISTORY: Marital Status: Single Preferred Language: English; Ethnicity: Not Hispanic Or Latino; Race: Black or African American Current Smoking Status: Patient smokes.   Tobacco Use Assessment Completed: Used Tobacco in last 30 days? Does not drink anymore.  Drinks 1 caffeinated drink per day.     Notes: Former smoker, [employer] In Usual Daily Activities, Exercise Habits, Living Independently, Activities Of Daily Living, Unemployed, Physical Disability:   REVIEW OF SYSTEMS:    GU Review Female:   Patient reports get up at night to urinate. Patient denies frequent urination, hard to postpone urination, burning /pain with urination, leakage of urine, stream starts and stops, trouble starting your stream, have to strain to urinate, and being pregnant.  Gastrointestinal (Upper):   Patient denies nausea, vomiting, and indigestion/ heartburn.  Gastrointestinal (Lower):   Patient denies diarrhea and constipation.  Constitutional:   Patient denies fever, night sweats, weight loss, and fatigue.  Skin:   Patient reports skin rash/ lesion and itching.   Eyes:   Patient denies blurred vision and double vision.  Ears/ Nose/ Throat:   Patient reports sinus problems. Patient denies sore throat.  Hematologic/Lymphatic:   Patient denies easy bruising and swollen glands.  Cardiovascular:   Patient denies leg swelling and chest pains.  Respiratory:   Patient denies cough and shortness of breath.  Endocrine:   Patient denies excessive thirst.  Musculoskeletal:   Patient reports back pain. Patient denies joint pain.  Neurological:   Patient denies headaches and dizziness.  Psychologic:   Patient denies depression and anxiety.   Notes: Flank pain    VITAL SIGNS:  03/14/2024 09:56 AM  BP 138/83 mmHg  Pulse 89 /min  Temperature 97.3 F / 36.2 C   GU PHYSICAL EXAMINATION:      Notes: mild right CVA ttp   MULTI-SYSTEM PHYSICAL EXAMINATION:    Constitutional: Well-nourished. No physical  deformities. Normally developed. Good grooming.  Neck: Neck symmetrical, not swollen. Normal tracheal position.  Respiratory: No labored breathing, no use of accessory muscles.   Skin: No paleness, no jaundice, no cyanosis. No lesion, no ulcer, no rash.  Neurologic / Psychiatric: Oriented to time, oriented to place, oriented to person. No depression, no anxiety, no agitation.  Eyes: Normal conjunctivae. Normal eyelids.  Ears, Nose, Mouth, and Throat: Left ear no scars, no lesions, no masses. Right ear no scars, no lesions, no masses. Nose no scars, no lesions, no masses. Normal hearing. Normal lips.  Musculoskeletal: Normal gait and station of head and neck.     Complexity of Data:  Records Review:   Previous Patient Records, POC Tool  Urine Test Review:   Urinalysis  X-Ray Review: C.T. Abdomen/Pelvis: Reviewed Films. Reviewed Report. Discussed With Patient. IMPRESSION:  1. Stable nonobstructing 16 mm right renal calculus.  2. Fibroid uterus.    Electronically Signed  By: Sharlet Salina M.D.  On: 03/13/2024 20:35    PROCEDURES:          Ketoralac 60mg  - [id number], [id number] 60 mg given 0 wasted.   Qty: 60 Adm. By: Roanna Banning  Unit: mg Lot No [id number]  Route: IM Exp. Date 03/27/2025  Freq: None Mfgr.:   Site: Right Buttock   ASSESSMENT:      ICD-10 Details  1 GU:   Renal calculus - N20.0 Chronic, Worsening  2   Flank Pain - R10.84 Acute, Uncomplicated   PLAN:            Medications New Meds: Ketorolac Tromethamine 10 mg tablet 1 tablet PO Q 8 H PRN kidney stone pain  #20  0 Refill(s)  Oxycodone Hcl 5 mg tablet 1 tablet PO Q 6 H PRN kidney stone pain  #20  0 Refill(s)  Pharmacy Name:  [id number]  Address:  [address]   [place]), [place] [address]  Phone:  [phone]  Fax:  [phone]            Document Letter(s):  Created for Patient: Clinical Summary         Notes:   Urolithiasis:  -Reviewed patient's imaging and patient received  Toradol in the office today  -We discussed management of stone including ESWL, PCNL and ureteroscopy.  -Based on size and location we decided to proceed with right ureteroscopy with laser lithotripsy and stent placement. Risks and benefits of the procedure discussed the patient in detail including but not limited to pain, bleeding, infection, damage to surrounding structures, ureteral stricture, need for additional treatment, need for staged procedure  -Oxycodone and Toradol for pain control. Patient has medication for nausea from ED.  -We also discussed a stent as a temporizing measure however patient does not have significant hydronephrosis and is more likely ball valving.

## 2024-03-21 ENCOUNTER — Other Ambulatory Visit: Payer: Self-pay

## 2024-03-21 ENCOUNTER — Encounter (HOSPITAL_COMMUNITY)
Admission: RE | Admit: 2024-03-21 | Discharge: 2024-03-21 | Disposition: A | Discharge status: Home / Self Care | Source: Ambulatory Visit | Attending: Urology | Admitting: Urology

## 2024-03-21 ENCOUNTER — Encounter (HOSPITAL_COMMUNITY): Payer: Self-pay

## 2024-03-21 VITALS — Ht 66.0 in | Wt 220.0 lb

## 2024-03-21 DIAGNOSIS — E1165 Type 2 diabetes mellitus with hyperglycemia: Secondary | ICD-10-CM

## 2024-03-21 HISTORY — DX: Unspecified osteoarthritis, unspecified site: M19.90

## 2024-03-21 HISTORY — DX: Cardiac murmur, unspecified: R01.1

## 2024-03-21 HISTORY — DX: Type 2 diabetes mellitus without complications: E11.9

## 2024-03-21 HISTORY — DX: Other complications of anesthesia, initial encounter: T88.59XA

## 2024-03-21 NOTE — Anesthesia Preprocedure Evaluation (Signed)
 Anesthesia Evaluation  Patient identified by MRN, date of birth, ID band Patient awake    Reviewed: Allergy & Precautions, NPO status , Patient's Chart, lab work & pertinent test results  Airway Mallampati: II  TM Distance: >3 FB Neck ROM: Full    Dental no notable dental hx.    Pulmonary asthma , former smoker sarcoid   Pulmonary exam normal breath sounds clear to auscultation       Cardiovascular negative cardio ROS Normal cardiovascular exam Rhythm:Regular Rate:Normal     Neuro/Psych  PSYCHIATRIC DISORDERS Anxiety Depression       GI/Hepatic ,GERD  ,,(+) Hepatitis -  Endo/Other  negative endocrine ROSdiabetes    Renal/GU Renal calculus     Musculoskeletal  (+) Arthritis ,    Abdominal   Peds  Hematology   Anesthesia Other Findings   Reproductive/Obstetrics                              Anesthesia Physical Anesthesia Plan  ASA: 3  Anesthesia Plan: General   Post-op Pain Management: Tylenol PO (pre-op)*   Induction: Intravenous  PONV Risk Score and Plan: 3 and Ondansetron, Dexamethasone and Midazolam  Airway Management Planned: LMA  Additional Equipment:   Intra-op Plan:   Post-operative Plan: Extubation in OR  Informed Consent: I have reviewed the patients History and Physical, chart, labs and discussed the procedure including the risks, benefits and alternatives for the proposed anesthesia with the patient or authorized representative who has indicated his/her understanding and acceptance.     Dental advisory given  Plan Discussed with: Anesthesiologist and CRNA  Anesthesia Plan Comments:          Anesthesia Quick Evaluation

## 2024-03-21 NOTE — Progress Notes (Signed)
 Case: [id number] Date/Time: 03/22/24 0815   Procedures:      CYSTOSCOPY/URETEROSCOPY/HOLMIUM LASER/STENT PLACEMENT (Right) - CYSTOSCOPY/RIGHT URETEROSCOPY/HOLMIUM LASER LITHOTRIPSY/STENT PLACEMENT/RIGHT RETROGRADE PYELOGRAM     CYSTOSCOPY, WITH RETROGRADE PYELOGRAM (Right)   Anesthesia type: General   Diagnosis: Renal calculus [N20.0]   Pre-op diagnosis: RIGHT RENAL CALCULUS   Location: [facility] ROOM 08 / [facility]   Surgeons: Noel Christmas, MD       DISCUSSION: Rhonda Hester is a 48 yo female who presents to PAT prior to surgery above. PMH of smoking, kidney stones, sarcoidosis (of lung, liver, skin, joints), asthma, GERD, uncontrolled T2DM, anxiety, arthritis.  Prior anesthesia complications include intra op awareness during [facility]  Patient had an ED visit for chest pain and flank pain on 3/12. Cardiac w/u was negative and felt to be MSK due to chronic symptoms (reportedly CP for >6 months). Imaging positive for non obstructive right renal stone and advised to f/u with Urology.  Seen by Cardiology on 02/22/23 for chest pain. CTA coronary was obtained which did not show CAD. Echo obtained was normal.  Patient follows with Pulmonology due to sarcoidosis and asthma. Last seen on 10/11/23 at [facility]. She is on Humira which is being prescribed by Rheumatology. Her PFTs have improved since starting Humira. At her office visit she reported wheezing and was advised to start [facility]. Advised f/u in 6 months.  Patient last saw PCP on 02/28/24. A1c was drawn and was 10.5 and she was started on Metformin. Her Chronic SOB was noted to be stable.  VS: Ht 5\' 6"  (1.676 m)   Wt 99.8 kg   BMI 35.51 kg/m   PROVIDERS: Ivonne Andrew, NP Pulmonary: Milas Hock, MD Rheumatology: Dianne Dun, MD  LABS: Labs reviewed: Acceptable for surgery. (all labs ordered are listed, but only abnormal results are displayed)  Labs Reviewed - No data to display   IMAGES:  CT abdomen/pelvis 03/13/24:  IMPRESSION: 1.  Stable nonobstructing 16 mm right renal calculus. 2. Fibroid uterus.  CXR 03/06/24:  FINDINGS: The heart size and mediastinal contours are within normal limits. Strandy scarring is noted bilaterally. No consolidation, effusion, or pneumothorax. No acute osseous abnormality is seen.   IMPRESSION: Stable chest with no active cardiopulmonary disease.  EKG 03/07/24:  Sinus rhythm, rate 79 Nonspecific T abnormalities, diffuse leads  CV:  Echo 03/23/23:  IMPRESSIONS    1. Left ventricular ejection fraction, by estimation, is 60 to 65%. The left ventricle has normal function. The left ventricle has no regional wall motion abnormalities. Left ventricular diastolic parameters were normal.  2. Right ventricular systolic function is normal. The right ventricular size is mildly enlarged. Tricuspid regurgitation signal is inadequate for assessing PA pressure.  3. The mitral valve is normal in structure. Trivial mitral valve regurgitation.  4. The aortic valve is tricuspid. Aortic valve regurgitation is not visualized. No aortic stenosis is present.  5. Aortic dilatation noted. There is borderline dilatation of the ascending aorta, measuring 39 mm.  6. The inferior vena cava is normal in size with greater than 50% respiratory variability, suggesting right atrial pressure of 3 mmHg.  CTA coronary 03/02/23:  Comparison(s): No prior Echocardiogram.  IMPRESSION: 1. Coronary calcium score of 0. This was 0 percentile for age-, race-, and sex-matched controls.   2. Normal coronary origin with right dominance.   3. There is minimal (<25%) soft plaque in the distal RCA. CAD-RADS 1. Past Medical History:  Diagnosis Date   Allergic asthma    Allergy  SEASONAL   Anxiety    Arthritis    in shoulder   Breast mass 05/2017   lt breast lump   Complication of anesthesia    woke up during D&C   Diabetes mellitus without complication (HCC)    Eczema    Fibroid    GERD (gastroesophageal  reflux disease)    Heart murmur    as child per pt   History of anal fissures    History of Bell's palsy    2006  RIGHT SIDE--  RESOLVED   History of gastroesophageal reflux (GERD)    History of kidney stones    Hyperlipidemia    Interstitial cystitis    Psoriasis    Sarcoidosis of lung (HCC)    DX 2006--  PULMOLOGIST--  DR ZOXW- on 2 liters of oxygen, goes to pain clinic   Seasonal allergic rhinitis    Shortness of breath    Vaginal Pap smear, abnormal     Past Surgical History:  Procedure Laterality Date   CYSTO WITH HYDRODISTENSION N/A 08/24/2013   Procedure: CYSTOSCOPY/HYDRODISTENSION with instillation of marcaine and pyridium;  Surgeon: [name], MD;  [facility]  Service: Urology;  Laterality: N/A;   CYSTOSCOPY/ HYDRODISTENTION/ BLADDER BX  06-09-2010   CYSTOSCOPY/URETEROSCOPY/HOLMIUM LASER/STENT PLACEMENT Right 01/30/2019   Procedure: CYSTOSCOPY/RETROGRADE/URETEROSCOPY/HOLMIUM LASER/STENT PLACEMENT;  Surgeon: Jerilee Field, MD;  Location: [facility];  Service: Urology;  Laterality: Right;   DILATION AND CURETTAGE OF UTERUS  1997   EXTRACORPOREAL SHOCK WAVE LITHOTRIPSY Right 11/30/2018   Procedure: EXTRACORPOREAL SHOCK WAVE LITHOTRIPSY (ESWL);  Surgeon: Alfredo Martinez, MD;  Location: [facility];  Service: Urology;  Laterality: Right;   IR TRANSCATHETER BX  06/14/2018   IR US GUIDE VASC ACCESS RIGHT  06/14/2018   IR VENOGRAM HEPATIC W HEMODYNAMIC EVALUATION  06/14/2018    MEDICATIONS:  Adalimumab 40 MG/0.8ML PNKT   aluminum-magnesium hydroxide-simethicone (MAALOX) 200-200-20 MG/5ML SUSP   Blood Glucose Monitoring Suppl DEVI   ciprofloxacin (CIPRO) 500 MG tablet   clobetasol ointment (TEMOVATE) 0.05 %   cyclobenzaprine (FLEXERIL) 5 MG tablet   DULERA 200-5 MCG/ACT AERO   fluconazole (DIFLUCAN) 150 MG tablet   Glucose Blood (BLOOD GLUCOSE TEST STRIPS) STRP   ketorolac (TORADOL) 10 MG tablet   Lancet Device MISC   Lancets Misc. MISC    metFORMIN (GLUCOPHAGE-XR) 500 MG 24 hr tablet   oxyCODONE (ROXICODONE) 15 MG immediate release tablet   pimecrolimus (ELIDEL) 1 % cream   pregabalin (LYRICA) 25 MG capsule   promethazine (PHENERGAN) 25 MG tablet   rosuvastatin (CRESTOR) 10 MG tablet   tamsulosin (FLOMAX) 0.4 MG CAPS capsule   Vitamin D, Ergocalciferol, (DRISDOL) 1.25 MG (50000 UNIT) CAPS capsule   No current facility-administered medications for this encounter.    bupivacaine (MARCAINE) 0.5 % 15 mL, phenazopyridine (PYRIDIUM) 400 mg bladder mixture   Ubaldo Glassing, PA-C [facility] Short Stay/Anesthesiology [facility] Phone [phone] 03/21/2024 9:26 AM

## 2024-03-22 ENCOUNTER — Ambulatory Visit (HOSPITAL_BASED_OUTPATIENT_CLINIC_OR_DEPARTMENT_OTHER)

## 2024-03-22 ENCOUNTER — Ambulatory Visit (HOSPITAL_COMMUNITY)
Admission: RE | Admit: 2024-03-22 | Discharge: 2024-03-22 | Disposition: A | Discharge status: Home / Self Care | Source: Ambulatory Visit | Attending: Urology | Admitting: Urology

## 2024-03-22 ENCOUNTER — Ambulatory Visit (HOSPITAL_COMMUNITY)

## 2024-03-22 ENCOUNTER — Encounter (HOSPITAL_COMMUNITY)
Admission: RE | Disposition: A | Discharge status: Home / Self Care | Payer: Self-pay | Source: Ambulatory Visit | Attending: Urology

## 2024-03-22 ENCOUNTER — Encounter (HOSPITAL_COMMUNITY): Payer: Self-pay | Admitting: Urology

## 2024-03-22 ENCOUNTER — Ambulatory Visit (HOSPITAL_COMMUNITY): Admitting: Medical

## 2024-03-22 DIAGNOSIS — Z7984 Long term (current) use of oral hypoglycemic drugs: Secondary | ICD-10-CM | POA: Diagnosis not present

## 2024-03-22 DIAGNOSIS — F17200 Nicotine dependence, unspecified, uncomplicated: Secondary | ICD-10-CM | POA: Diagnosis not present

## 2024-03-22 DIAGNOSIS — F32A Depression, unspecified: Secondary | ICD-10-CM | POA: Diagnosis not present

## 2024-03-22 DIAGNOSIS — N200 Calculus of kidney: Secondary | ICD-10-CM | POA: Insufficient documentation

## 2024-03-22 DIAGNOSIS — J45909 Unspecified asthma, uncomplicated: Secondary | ICD-10-CM

## 2024-03-22 DIAGNOSIS — E119 Type 2 diabetes mellitus without complications: Secondary | ICD-10-CM | POA: Insufficient documentation

## 2024-03-22 DIAGNOSIS — E1165 Type 2 diabetes mellitus with hyperglycemia: Secondary | ICD-10-CM

## 2024-03-22 DIAGNOSIS — F419 Anxiety disorder, unspecified: Secondary | ICD-10-CM | POA: Diagnosis not present

## 2024-03-22 DIAGNOSIS — F17210 Nicotine dependence, cigarettes, uncomplicated: Secondary | ICD-10-CM

## 2024-03-22 DIAGNOSIS — F418 Other specified anxiety disorders: Secondary | ICD-10-CM | POA: Diagnosis not present

## 2024-03-22 HISTORY — PX: CYSTOSCOPY/URETEROSCOPY/HOLMIUM LASER/STENT PLACEMENT: SHX6546

## 2024-03-22 HISTORY — PX: CYSTOSCOPY W/ RETROGRADES: SHX1426

## 2024-03-22 LAB — HEMOGLOBIN A1C
Hgb A1c MFr Bld: 9.5 % — ABNORMAL HIGH (ref 4.8–5.6)
Mean Plasma Glucose: 225.95 mg/dL

## 2024-03-22 LAB — POCT PREGNANCY, URINE: Preg Test, Ur: NEGATIVE

## 2024-03-22 LAB — GLUCOSE, CAPILLARY
Glucose-Capillary: 187 mg/dL — ABNORMAL HIGH (ref 70–99)
Glucose-Capillary: 189 mg/dL — ABNORMAL HIGH (ref 70–99)

## 2024-03-22 SURGERY — CYSTOSCOPY/URETEROSCOPY/HOLMIUM LASER/STENT PLACEMENT
Anesthesia: General | Laterality: Right

## 2024-03-22 MED ORDER — IOHEXOL 300 MG/ML  SOLN
INTRAMUSCULAR | Status: DC | PRN
  Administered 2024-03-22: 8 mL

## 2024-03-22 MED ORDER — TAMSULOSIN HCL 0.4 MG PO CAPS: 0.4000 mg | ORAL_CAPSULE | Freq: Every day | ORAL | 0 refills | Status: DC

## 2024-03-22 MED ORDER — DROPERIDOL 2.5 MG/ML IJ SOLN: 0.6250 mg | Freq: Once | INTRAMUSCULAR | Status: DC | PRN

## 2024-03-22 MED ORDER — ORAL CARE MOUTH RINSE: 15.0000 mL | Freq: Once | OROMUCOSAL | Status: DC

## 2024-03-22 MED ORDER — DEXAMETHASONE SODIUM PHOSPHATE 10 MG/ML IJ SOLN
INTRAMUSCULAR | Status: DC | PRN
  Administered 2024-03-22: 5 mg via INTRAVENOUS

## 2024-03-22 MED ORDER — OXYCODONE HCL 5 MG/5ML PO SOLN: 5.0000 mg | Freq: Once | ORAL | Status: DC | PRN

## 2024-03-22 MED ORDER — ONDANSETRON HCL 4 MG/2ML IJ SOLN
INTRAMUSCULAR | Status: AC
  Filled 2024-03-22: qty 2

## 2024-03-22 MED ORDER — CHLORHEXIDINE GLUCONATE 0.12 % MT SOLN: 15.0000 mL | Freq: Once | OROMUCOSAL | Status: DC

## 2024-03-22 MED ORDER — PROPOFOL 10 MG/ML IV BOLUS
INTRAVENOUS | Status: AC
  Filled 2024-03-22: qty 20

## 2024-03-22 MED ORDER — PROPOFOL 10 MG/ML IV BOLUS
INTRAVENOUS | Status: DC | PRN
  Administered 2024-03-22: 200 mg via INTRAVENOUS

## 2024-03-22 MED ORDER — SODIUM CHLORIDE 0.9 % IR SOLN
Status: DC | PRN
  Administered 2024-03-22: 3000 mL via INTRAVESICAL

## 2024-03-22 MED ORDER — FENTANYL CITRATE (PF) 100 MCG/2ML IJ SOLN
INTRAMUSCULAR | Status: DC | PRN
  Administered 2024-03-22 (×2): 50 ug via INTRAVENOUS
  Administered 2024-03-22: 100 ug via INTRAVENOUS

## 2024-03-22 MED ORDER — LIDOCAINE HCL (PF) 2 % IJ SOLN
INTRAMUSCULAR | Status: AC
  Filled 2024-03-22: qty 5

## 2024-03-22 MED ORDER — LIDOCAINE HCL (PF) 2 % IJ SOLN
INTRAMUSCULAR | Status: DC | PRN
  Administered 2024-03-22: 100 mg via INTRADERMAL

## 2024-03-22 MED ORDER — ACETAMINOPHEN 10 MG/ML IV SOLN: 1000.0000 mg | Freq: Once | INTRAVENOUS | Status: DC | PRN

## 2024-03-22 MED ORDER — CEFAZOLIN SODIUM-DEXTROSE 2-4 GM/100ML-% IV SOLN
2.0000 g | INTRAVENOUS | Status: AC
  Administered 2024-03-22: 2 g via INTRAVENOUS
  Filled 2024-03-22: qty 100

## 2024-03-22 MED ORDER — LACTATED RINGERS IV SOLN: INTRAVENOUS | Status: DC

## 2024-03-22 MED ORDER — OXYCODONE HCL 5 MG PO TABS: 5.0000 mg | ORAL_TABLET | Freq: Three times a day (TID) | ORAL | 0 refills | Status: DC | PRN

## 2024-03-22 MED ORDER — OXYCODONE HCL 5 MG PO TABS: 5.0000 mg | ORAL_TABLET | Freq: Once | ORAL | Status: DC | PRN

## 2024-03-22 MED ORDER — MIDAZOLAM HCL 2 MG/2ML IJ SOLN
INTRAMUSCULAR | Status: AC
  Filled 2024-03-22: qty 2

## 2024-03-22 MED ORDER — FENTANYL CITRATE (PF) 100 MCG/2ML IJ SOLN
INTRAMUSCULAR | Status: AC
  Filled 2024-03-22: qty 2

## 2024-03-22 MED ORDER — FENTANYL CITRATE PF 50 MCG/ML IJ SOSY: 25.0000 ug | PREFILLED_SYRINGE | INTRAMUSCULAR | Status: DC | PRN

## 2024-03-22 MED ORDER — HYOSCYAMINE SULFATE 0.125 MG PO TBDP: 0.1250 mg | ORAL_TABLET | Freq: Four times a day (QID) | ORAL | 0 refills | Status: DC | PRN

## 2024-03-22 MED ORDER — ACETAMINOPHEN 500 MG PO TABS
1000.0000 mg | ORAL_TABLET | Freq: Once | ORAL | Status: DC
Start: 2024-03-22 — End: 2024-03-22
  Filled 2024-03-22: qty 2

## 2024-03-22 MED ORDER — INSULIN ASPART 100 UNIT/ML IJ SOLN
0.0000 [IU] | INTRAMUSCULAR | Status: DC | PRN
  Administered 2024-03-22: 4 [IU] via SUBCUTANEOUS
  Filled 2024-03-22: qty 1

## 2024-03-22 MED ORDER — MIDAZOLAM HCL 5 MG/5ML IJ SOLN
INTRAMUSCULAR | Status: DC | PRN
  Administered 2024-03-22: 2 mg via INTRAVENOUS

## 2024-03-22 MED ORDER — DEXAMETHASONE SODIUM PHOSPHATE 10 MG/ML IJ SOLN
INTRAMUSCULAR | Status: AC
  Filled 2024-03-22: qty 1

## 2024-03-22 MED ORDER — ONDANSETRON HCL 4 MG/2ML IJ SOLN
INTRAMUSCULAR | Status: DC | PRN
  Administered 2024-03-22: 4 mg via INTRAVENOUS

## 2024-03-22 SURGICAL SUPPLY — 22 items
BAG URO CATCHER STRL LF (MISCELLANEOUS) ×1 IMPLANT
BASKET ZERO TIP NITINOL 2.4FR (BASKET) IMPLANT
CATH URETL OPEN 5X70 (CATHETERS) ×1 IMPLANT
CATH URETL OPEN END 6FR 70 (CATHETERS) ×1 IMPLANT
CLOTH BEACON ORANGE TIMEOUT ST (SAFETY) ×1 IMPLANT
DRSG TEGADERM 2-3/8X2-3/4 SM (GAUZE/BANDAGES/DRESSINGS) IMPLANT
EXTRACTOR STONE 1.7FRX115CM (UROLOGICAL SUPPLIES) IMPLANT
FIBER LASER MOSES 200 DFL (Laser) IMPLANT
FIBER LASER MOSES 365 DFL (Laser) IMPLANT
GLOVE BIO SURGEON STRL SZ 6.5 (GLOVE) ×1 IMPLANT
GOWN STRL REUS W/ TWL LRG LVL3 (GOWN DISPOSABLE) ×1 IMPLANT
GUIDEWIRE STR DUAL SENSOR (WIRE) ×1 IMPLANT
KIT TURNOVER KIT A (KITS) IMPLANT
LASER FIB FLEXIVA PULSE ID 365 (Laser) IMPLANT
MANIFOLD NEPTUNE II (INSTRUMENTS) ×1 IMPLANT
PACK CYSTO (CUSTOM PROCEDURE TRAY) ×1 IMPLANT
SHEATH NAVIGATOR HD 11/13X28 (SHEATH) IMPLANT
SHEATH NAVIGATOR HD 11/13X36 (SHEATH) IMPLANT
STENT URET 6FRX26 CONTOUR (STENTS) IMPLANT
TRACTIP FLEXIVA PULS ID 200XHI (Laser) IMPLANT
TUBING CONNECTING 10 (TUBING) ×1 IMPLANT
TUBING UROLOGY SET (TUBING) ×1 IMPLANT

## 2024-03-22 NOTE — Discharge Instructions (Addendum)
 DISCHARGE INSTRUCTIONS FOR KIDNEY STONE/URETERAL STENT   MEDICATIONS:  1. Resume all your other meds from home  2. AZO over the counter can help with the burning/stinging when you urinate. 3. Oxycodone is for moderate/severe pain, otherwise taking up to 1000 mg every 6 hours of plainTylenol will help treat your pain.   4. Hyoscyamine can help with bladder cramping from the stent. 5. Tamsulosin can help with stent discomfort   ACTIVITY:  1. No strenuous activity x 1week  2. No driving while on narcotic pain medications  3. Drink plenty of water  4. Continue to walk at home - you can still get blood clots when you are at home, so keep active, but don't over do it.  5. May return to work/school tomorrow or when you feel ready   BATHING:  1. You can shower and we recommend daily showers   SIGNS/SYMPTOMS TO CALL:  Please call us if you have a fever greater than 101.5, uncontrolled nausea/vomiting, uncontrolled pain, dizziness, unable to urinate, bloody urine, chest pain, shortness of breath, leg swelling, leg pain, redness around wound, drainage from wound, or any other concerns or questions.   You can reach [place] at [phone].   FOLLOW-UP:  1. You will be contacted for stent removal in 1 week

## 2024-03-22 NOTE — Anesthesia Postprocedure Evaluation (Signed)
 Anesthesia Post Note  Patient: Rhonda Hester  Procedure(s) Performed: CYSTOSCOPY/URETEROSCOPY/HOLMIUM LASER/STENT PLACEMENT (Right) CYSTOSCOPY, WITH RETROGRADE PYELOGRAM (Right)     Patient location during evaluation: PACU Anesthesia Type: General Level of consciousness: awake and alert Pain management: pain level controlled Vital Signs Assessment: post-procedure vital signs reviewed and stable Respiratory status: spontaneous breathing, nonlabored ventilation, respiratory function stable and patient connected to nasal cannula oxygen Cardiovascular status: blood pressure returned to baseline and stable Postop Assessment: no apparent nausea or vomiting Anesthetic complications: no   No notable events documented.  Last Vitals:  Vitals:   03/22/24 1030 03/22/24 1044  BP: (!) 148/97 (!) 154/83  Pulse: 84 79  Resp: 14 14  Temp: 36.7 C 37.2 C  SpO2: 93% 94%    Last Pain:  Vitals:   03/22/24 1052  TempSrc:   PainSc: 0-No pain                 [place]

## 2024-03-22 NOTE — Interval H&P Note (Signed)
 History and Physical Interval Note:  03/22/2024 8:10 AM  Rhonda Hester  has presented today for surgery, with the diagnosis of RIGHT RENAL CALCULUS.  The various methods of treatment have been discussed with the patient and family. After consideration of risks, benefits and other options for treatment, the patient has consented to  Procedure(s) with comments: CYSTOSCOPY/URETEROSCOPY/HOLMIUM LASER/STENT PLACEMENT (Right) - CYSTOSCOPY/RIGHT URETEROSCOPY/HOLMIUM LASER LITHOTRIPSY/STENT PLACEMENT/RIGHT RETROGRADE PYELOGRAM CYSTOSCOPY, WITH RETROGRADE PYELOGRAM (Right) as a surgical intervention.  The patient's history has been reviewed, patient examined, no change in status, stable for surgery.  I have reviewed the patient's chart and labs.  Questions were answered to the patient's satisfaction.     Antwyne Pingree D Para [place]

## 2024-03-22 NOTE — Transfer of Care (Signed)
 Immediate Anesthesia Transfer of Care Note  Patient: Rhonda Hester  Procedure(s) Performed: CYSTOSCOPY/URETEROSCOPY/HOLMIUM LASER/STENT PLACEMENT (Right) CYSTOSCOPY, WITH RETROGRADE PYELOGRAM (Right)  Patient Location: PACU  Anesthesia Type:General  Level of Consciousness: sedated  Airway & Oxygen Therapy: Patient Spontanous Breathing and Patient connected to face mask oxygen  Post-op Assessment: Report given to RN and Post -op Vital signs reviewed and stable  Post vital signs: Reviewed and stable  Last Vitals:  Vitals Value Taken Time  BP    Temp    Pulse 85 03/22/24 0954  Resp    SpO2 95 % 03/22/24 0954  Vitals shown include unfiled device data.  Last Pain:  Vitals:   03/22/24 0743  TempSrc:   PainSc: 8          Complications: No notable events documented.

## 2024-03-22 NOTE — Anesthesia Procedure Notes (Signed)
 Procedure Name: LMA Insertion Date/Time: 03/22/2024 8:48 AM  Performed by: Doran Clay, CRNAPre-anesthesia Checklist: Patient identified, Emergency Drugs available, Suction available, Patient being monitored and Timeout performed Patient Re-evaluated:Patient Re-evaluated prior to induction Oxygen Delivery Method: Circle system utilized Preoxygenation: Pre-oxygenation with 100% oxygen Induction Type: IV induction LMA: LMA inserted LMA Size: 4.0 Tube type: Oral Number of attempts: 1 Placement Confirmation: positive ETCO2 and breath sounds checked- equal and bilateral Tube secured with: Tape Dental Injury: Teeth and Oropharynx as per pre-operative assessment

## 2024-03-22 NOTE — Op Note (Signed)
 Preoperative diagnosis: right renal calculus  Postoperative diagnosis: right renal calculus  Procedure:  Cystoscopy right ureteroscopy, laser lithotripsy, basket stone extraction right 74F x 26 ureteral stent placement - no string right retrograde pyelography with interpretation  Surgeon: Kasandra Knudsen, MD  Anesthesia: General  Complications: None  Intraoperative findings:  Normal urethra Bilateral orthotropic ureteral orifices right retrograde pyelography demonstrated a filling defect within the right renal pelvis consistent with the patient's known calculus without other abnormalities. Bladder mucosa normal without masses   EBL: Minimal  Specimens: right renal calculus  Disposition of specimens: [facility] for stone analysis  Indication: Rhonda Hester is a 48 y.o.   patient with a 15 mm right renal stone and associated right symptoms. After reviewing the management options for treatment, the patient elected to proceed with the above surgical procedure(s). We have discussed the potential benefits and risks of the procedure, side effects of the proposed treatment, the likelihood of the patient achieving the goals of the procedure, and any potential problems that might occur during the procedure or recuperation. Informed consent has been obtained.   Description of procedure:  The patient was taken to the operating room and general anesthesia was induced.  The patient was placed in the dorsal lithotomy position, prepped and draped in the usual sterile fashion, and preoperative antibiotics were administered. A preoperative time-out was performed.   Cystourethroscopy was performed.  The patient's urethra was examined and was normal. The bladder was then systematically examined in its entirety. There was no evidence for any bladder tumors, stones, or other mucosal pathology.    Attention then turned to the right ureteral orifice and a ureteral catheter was  used to intubate the ureteral orifice.  Omnipaque contrast was injected through the ureteral catheter and a retrograde pyelogram was performed with findings as dictated above.  A sensor wire was advanced through the open ureteral catheter and the ureteral catheter was removed.  A second sensor wire was advanced alongside the first sensor wire and secured as a safety wire.  A ureteral access sheath was then placed over the working wire and advanced the proximal ureter with fluoroscopy.  The inner sheath and wire removed.  Flexible ureteroscopy took place and the known calculus was encountered in the renal pelvis. The stone was then fragmented with the 242 micron holmium laser fiber.  The larger stone fragments were then removed from the ureter with a 0 tip basket.  Visualization was quite poor overall.  Reinspection of the ureter revealed no remaining visible stones or fragments.   The wire was then backloaded through the cystoscope and a ureteral stent was advance over the wire using Seldinger technique.  The stent was positioned appropriately under fluoroscopic and cystoscopic guidance.  The wire was then removed with an adequate stent curl noted in the renal pelvis as well as in the bladder.  The bladder was then emptied and the procedure ended.  The patient appeared to tolerate the procedure well and without complications.  The patient was able to be awakened and transferred to the recovery unit in satisfactory condition.   Disposition: Due to poor visualization and inability to assess to see if there is significant stone burden left the stent will be left in place for 1 week.  Should be contacted for stent removal in the office.

## 2024-03-23 ENCOUNTER — Encounter (HOSPITAL_COMMUNITY): Payer: Self-pay | Admitting: Urology

## 2024-03-24 ENCOUNTER — Other Ambulatory Visit: Payer: Self-pay | Admitting: Dermatology

## 2024-03-24 DIAGNOSIS — L309 Dermatitis, unspecified: Secondary | ICD-10-CM

## 2024-03-27 DIAGNOSIS — D860 Sarcoidosis of lung: Secondary | ICD-10-CM | POA: Diagnosis not present

## 2024-03-27 DIAGNOSIS — R0600 Dyspnea, unspecified: Secondary | ICD-10-CM | POA: Diagnosis not present

## 2024-03-27 DIAGNOSIS — L403 Pustulosis palmaris et plantaris: Secondary | ICD-10-CM | POA: Diagnosis not present

## 2024-03-30 ENCOUNTER — Ambulatory Visit: Payer: Self-pay | Admitting: Nurse Practitioner

## 2024-03-30 DIAGNOSIS — N200 Calculus of kidney: Secondary | ICD-10-CM | POA: Diagnosis not present

## 2024-03-30 DIAGNOSIS — R8271 Bacteriuria: Secondary | ICD-10-CM | POA: Diagnosis not present

## 2024-03-30 DIAGNOSIS — R1084 Generalized abdominal pain: Secondary | ICD-10-CM | POA: Diagnosis not present

## 2024-04-13 DIAGNOSIS — D860 Sarcoidosis of lung: Secondary | ICD-10-CM | POA: Diagnosis not present

## 2024-04-16 DIAGNOSIS — N200 Calculus of kidney: Secondary | ICD-10-CM | POA: Diagnosis not present

## 2024-04-19 DIAGNOSIS — M545 Low back pain, unspecified: Secondary | ICD-10-CM | POA: Diagnosis not present

## 2024-04-19 DIAGNOSIS — G894 Chronic pain syndrome: Secondary | ICD-10-CM | POA: Diagnosis not present

## 2024-04-19 DIAGNOSIS — M25562 Pain in left knee: Secondary | ICD-10-CM | POA: Diagnosis not present

## 2024-04-19 DIAGNOSIS — Z79891 Long term (current) use of opiate analgesic: Secondary | ICD-10-CM | POA: Diagnosis not present

## 2024-04-19 DIAGNOSIS — M25551 Pain in right hip: Secondary | ICD-10-CM | POA: Diagnosis not present

## 2024-04-20 ENCOUNTER — Ambulatory Visit (INDEPENDENT_AMBULATORY_CARE_PROVIDER_SITE_OTHER): Payer: Self-pay | Admitting: Nurse Practitioner

## 2024-04-20 ENCOUNTER — Encounter: Payer: Self-pay | Admitting: Nurse Practitioner

## 2024-04-20 DIAGNOSIS — E1165 Type 2 diabetes mellitus with hyperglycemia: Secondary | ICD-10-CM | POA: Diagnosis not present

## 2024-04-20 DIAGNOSIS — E785 Hyperlipidemia, unspecified: Secondary | ICD-10-CM | POA: Diagnosis not present

## 2024-04-20 MED ORDER — ROSUVASTATIN CALCIUM 10 MG PO TABS
10.0000 mg | ORAL_TABLET | Freq: Every day | ORAL | 0 refills | Status: DC
Start: 2024-04-20 — End: 2024-07-20

## 2024-04-20 MED ORDER — METFORMIN HCL ER 500 MG PO TB24
500.0000 mg | ORAL_TABLET | Freq: Two times a day (BID) | ORAL | 3 refills | Status: DC
Start: 2024-04-20 — End: 2024-10-11

## 2024-04-20 NOTE — Progress Notes (Signed)
 Subjective   Patient ID: Rhonda Hester, female    DOB: 01-Mar-1976, 48 y.o.   MRN: [id number]  Chief Complaint  Patient presents with   Diabetes    Follow up on DM, patient has lost weight     Referring provider: Jerrlyn Morel, NP  Rhonda Hester is a 48 y.o. female with Past Medical History: No date: Allergic asthma No date: Allergy      Comment:  SEASONAL No date: Anxiety No date: Arthritis     Comment:  in shoulder 05/2017: Breast mass     Comment:  lt breast lump No date: Complication of anesthesia     Comment:  woke up during D&C No date: Diabetes mellitus without complication (HCC) No date: Eczema No date: Fibroid No date: GERD (gastroesophageal reflux disease) No date: Heart murmur     Comment:  as child per pt No date: History of anal fissures No date: History of Bell's palsy     Comment:  2006  RIGHT SIDE--  RESOLVED No date: History of gastroesophageal reflux (GERD) No date: History of kidney stones No date: Hyperlipidemia No date: Interstitial cystitis No date: Psoriasis No date: Sarcoidosis of lung (HCC)     Comment:  DX 2006--  PULMOLOGIST--  DR Harper.Guest- on 2 liters of               oxygen , goes to pain clinic No date: Seasonal allergic rhinitis No date: Shortness of breath No date: Vaginal Pap smear, abnormal   HPI  Patient presents today for follow-up on diabetes.  Overall she has been doing good and has been compliant with metformin  and Crestor .  She declines the addition of Ozempic at this point.  She has started walking routine. Denies f/c/s, n/v/d, hemoptysis, PND, leg swelling Denies chest pain or edema       Allergies  Allergen Reactions   Acyclovir  And Related Other (See Comments)    Patient states it "made me feel like I can't breath"    Bee Pollen Itching and Cough     coughing   Citric Acid Other (See Comments)    AVOIDS DUE TO INTERSTITIAL CYSTITIS   Dust Mite Extract Itching and Cough     coughing   Grapeseed  Extract [Nutritional Supplements] Itching    Throat itching and burning   Pineapple Itching    Burning in mouth and tingling lips   Pollen Extract Itching and Other (See Comments)     coughing   Nickel Rash    Immunization History  Administered Date(s) Administered   Dtap, Unspecified 03/25/1977, 06/15/1977, 12/02/1977, 04/05/1979, 05/14/1981   HPV 9-valent 11/15/2019   Influenza Whole 09/02/2011, 09/26/2012   Influenza,inj,Quad PF,6+ Mos 12/27/2012, 09/04/2015, 09/20/2017, 08/10/2018, 08/27/2019, 11/20/2019   Influenza-Unspecified 12/27/2012, 09/04/2015, 09/20/2017, 08/10/2018   Measles 04/29/1978   Mumps 04/29/1978   Pneumococcal Conjugate-13 11/14/2018   Pneumococcal Polysaccharide-23 05/10/2016   Polio, Unspecified 03/25/1977, 06/15/1977, 12/02/1977, 04/29/1978, 04/05/1979, 05/14/1981   Rubella 04/29/1978   Tdap 05/10/2016    Tobacco History: Social History   Tobacco Use  Smoking Status Every Day   Types: Cigars  Smokeless Tobacco Never  Tobacco Comments   "Smokes 1 black and mild occasionally"   Ready to quit: Yes Counseling given: Yes Tobacco comments: "Smokes 1 black and mild occasionally"   Outpatient Encounter Medications as of 04/20/2024  Medication Sig   Adalimumab 40 MG/0.8ML PNKT Inject 40 mg into the skin every 14 (fourteen) days.   aluminum -magnesium  hydroxide-simethicone  (MAALOX) 200-200-20 MG/5ML SUSP  Take 15 mLs by mouth 3 (three) times daily as needed.   Blood Glucose Monitoring Suppl DEVI 1 each by Does not apply route 2 (two) times daily. May substitute to any manufacturer covered by patient's insurance.   clobetasol  ointment (TEMOVATE ) 0.05 % Apply 1 Application topically 2 (two) times daily. Apply to affected areas twice daily then stop. Alternate with Pimecrolimus  cream   DULERA 200-5 MCG/ACT AERO Inhale 2 puffs into the lungs 2 (two) times daily.   Glucose Blood (BLOOD GLUCOSE TEST STRIPS) STRP 1 each by In Vitro route 2 (two) times daily. May  substitute to any manufacturer covered by patient's insurance.   hyoscyamine  (ANASPAZ ) 0.125 MG TBDP disintergrating tablet Place 1 tablet (0.125 mg total) under the tongue every 6 (six) hours as needed.   ketorolac  (TORADOL ) 10 MG tablet Take 1 tablet (10 mg total) by mouth every 6 (six) hours as needed.   oxyCODONE  (ROXICODONE ) 15 MG immediate release tablet Take 15 mg by mouth 4 (four) times daily.   oxyCODONE  (ROXICODONE ) 5 MG immediate release tablet Take 1 tablet (5 mg total) by mouth every 8 (eight) hours as needed.   pimecrolimus  (ELIDEL ) 1 % cream APPLY   TOPICALLY TWICE DAILY   promethazine  (PHENERGAN ) 25 MG tablet Take 1 tablet (25 mg total) by mouth every 6 (six) hours as needed for nausea or vomiting.   tamsulosin  (FLOMAX ) 0.4 MG CAPS capsule Take 1 capsule (0.4 mg total) by mouth at bedtime.   [DISCONTINUED] metFORMIN  (GLUCOPHAGE -XR) 500 MG 24 hr tablet Take 1 tablet (500 mg total) by mouth 2 (two) times daily with a meal.   ciprofloxacin  (CIPRO ) 500 MG tablet Take 1 tablet (500 mg total) by mouth every 12 (twelve) hours. (Patient not taking: Reported on 04/20/2024)   cyclobenzaprine  (FLEXERIL ) 5 MG tablet Take 1 tablet (5 mg total) by mouth 3 (three) times daily as needed. (Patient not taking: Reported on 02/28/2024)   fluconazole  (DIFLUCAN ) 150 MG tablet Take 1 tablet (150 mg total) by mouth daily. (Patient not taking: Reported on 02/28/2024)   metFORMIN  (GLUCOPHAGE -XR) 500 MG 24 hr tablet Take 1 tablet (500 mg total) by mouth 2 (two) times daily with a meal.   pregabalin (LYRICA) 25 MG capsule Take 25 mg by mouth 3 (three) times daily. (Patient not taking: Reported on 02/28/2024)   rosuvastatin  (CRESTOR ) 10 MG tablet Take 1 tablet (10 mg total) by mouth daily.   tamsulosin  (FLOMAX ) 0.4 MG CAPS capsule Take 1 capsule (0.4 mg total) by mouth daily after supper. (Patient not taking: Reported on 03/15/2024)   Vitamin D , Ergocalciferol , (DRISDOL ) 1.25 MG (50000 UNIT) CAPS capsule Take 1 capsule  (50,000 Units total) by mouth every 7 (seven) days. (Patient not taking: Reported on 03/16/2024)   [DISCONTINUED] famotidine  (PEPCID ) 20 MG tablet Take 1 tablet (20 mg total) by mouth 2 (two) times daily.   [DISCONTINUED] rosuvastatin  (CRESTOR ) 10 MG tablet Take 1 tablet (10 mg total) by mouth daily. (Patient not taking: Reported on 03/16/2024)   Facility-Administered Encounter Medications as of 04/20/2024  Medication   bupivacaine  (MARCAINE ) 0.5 % 15 mL, phenazopyridine  (PYRIDIUM ) 400 mg bladder mixture    Review of Systems  Review of Systems  Constitutional: Negative.   HENT: Negative.    Cardiovascular: Negative.   Gastrointestinal: Negative.   Allergic/Immunologic: Negative.   Neurological: Negative.   Psychiatric/Behavioral: Negative.       Objective:   BP (!) 139/90   Pulse 96   Temp 97.9 F (36.6 C) (Oral)  Wt 214 lb (97.1 kg)   SpO2 99%   BMI 34.54 kg/m   Wt Readings from Last 5 Encounters:  04/20/24 214 lb (97.1 kg)  03/22/24 220 lb (99.8 kg)  03/21/24 220 lb (99.8 kg)  03/13/24 218 lb 14.7 oz (99.3 kg)  03/06/24 219 lb (99.3 kg)     Physical Exam Vitals and nursing note reviewed.  Constitutional:      General: She is not in acute distress.    Appearance: She is well-developed.  Cardiovascular:     Rate and Rhythm: Normal rate and regular rhythm.  Pulmonary:     Effort: Pulmonary effort is normal.     Breath sounds: Normal breath sounds.  Neurological:     Mental Status: She is alert and oriented to person, place, and time.       Assessment & Plan:   Dyslipidemia, goal LDL below 70 -     Rosuvastatin  Calcium ; Take 1 tablet (10 mg total) by mouth daily.  Dispense: 90 tablet; Refill: 0  Type 2 diabetes mellitus with hyperglycemia, without long-term current use of insulin  (HCC) -     metFORMIN  HCl ER; Take 1 tablet (500 mg total) by mouth 2 (two) times daily with a meal.  Dispense: 60 tablet; Refill: 3     Return in about 3 months (around  07/20/2024).   Jerrlyn Morel, NP 04/20/2024

## 2024-04-20 NOTE — Patient Instructions (Signed)
 1. Dyslipidemia, goal LDL below 70  - rosuvastatin  (CRESTOR ) 10 MG tablet; Take 1 tablet (10 mg total) by mouth daily.  Dispense: 90 tablet; Refill: 0  2. Type 2 diabetes mellitus with hyperglycemia, without long-term current use of insulin  (HCC)  - metFORMIN  (GLUCOPHAGE -XR) 500 MG 24 hr tablet; Take 1 tablet (500 mg total) by mouth 2 (two) times daily with a meal.  Dispense: 60 tablet; Refill: 3

## 2024-04-24 ENCOUNTER — Encounter

## 2024-04-24 ENCOUNTER — Ambulatory Visit
Admission: RE | Admit: 2024-04-24 | Discharge: 2024-04-24 | Disposition: A | Discharge status: Home / Self Care | Source: Ambulatory Visit | Attending: Nurse Practitioner | Admitting: Nurse Practitioner

## 2024-04-24 DIAGNOSIS — Z1231 Encounter for screening mammogram for malignant neoplasm of breast: Secondary | ICD-10-CM | POA: Diagnosis not present

## 2024-04-24 DIAGNOSIS — Z309 Encounter for contraceptive management, unspecified: Secondary | ICD-10-CM

## 2024-04-24 DIAGNOSIS — M19012 Primary osteoarthritis, left shoulder: Secondary | ICD-10-CM | POA: Diagnosis not present

## 2024-04-25 ENCOUNTER — Ambulatory Visit: Payer: Self-pay | Admitting: Nurse Practitioner

## 2024-05-02 ENCOUNTER — Telehealth: Payer: Self-pay | Admitting: Nurse Practitioner

## 2024-05-02 NOTE — Telephone Encounter (Signed)
 Patient was identified as falling into the [place] Measure - Diabetes.   Patient was: Appointment already scheduled for:  07/20/2024. Pt had appt on 04/20/2024 w/o A1C since A1C done on 03/22/24

## 2024-05-13 DIAGNOSIS — D860 Sarcoidosis of lung: Secondary | ICD-10-CM | POA: Diagnosis not present

## 2024-05-17 DIAGNOSIS — R51 Headache with orthostatic component, not elsewhere classified: Secondary | ICD-10-CM | POA: Diagnosis not present

## 2024-05-17 DIAGNOSIS — Z79891 Long term (current) use of opiate analgesic: Secondary | ICD-10-CM | POA: Diagnosis not present

## 2024-05-17 DIAGNOSIS — G894 Chronic pain syndrome: Secondary | ICD-10-CM | POA: Diagnosis not present

## 2024-05-17 DIAGNOSIS — M25551 Pain in right hip: Secondary | ICD-10-CM | POA: Diagnosis not present

## 2024-05-17 DIAGNOSIS — M79671 Pain in right foot: Secondary | ICD-10-CM | POA: Diagnosis not present

## 2024-05-17 DIAGNOSIS — L409 Psoriasis, unspecified: Secondary | ICD-10-CM | POA: Diagnosis not present

## 2024-06-04 ENCOUNTER — Telehealth: Payer: Self-pay

## 2024-06-04 NOTE — Telephone Encounter (Signed)
 Copied from CRM [id number]. Topic: General - Other >> Jun 04, 2024  3:02 PM Marissa P wrote: Reason for CRM: Patient called wanting to know if she is do for her depo shot and if so if she can get scheduled for it. Wanted to know when her last one was etc. Contacted cal no answer, please follow up.   Pt made an appt. KH

## 2024-06-06 ENCOUNTER — Ambulatory Visit (INDEPENDENT_AMBULATORY_CARE_PROVIDER_SITE_OTHER)

## 2024-06-06 DIAGNOSIS — Z309 Encounter for contraceptive management, unspecified: Secondary | ICD-10-CM

## 2024-06-06 LAB — POCT URINE PREGNANCY: Preg Test, Ur: NEGATIVE

## 2024-06-06 MED ORDER — MEDROXYPROGESTERONE ACETATE 150 MG/ML IM SUSP
150.0000 mg | Freq: Once | INTRAMUSCULAR | Status: AC
  Administered 2024-06-06: 150 mg via INTRAMUSCULAR

## 2024-06-06 NOTE — Progress Notes (Signed)
 Pt was here for a depo injection. Off schedule so pregnancy test was done. Test was negative and depo was given. Pt tolerated well.    Julian Obey   CMA II

## 2024-06-13 DIAGNOSIS — D860 Sarcoidosis of lung: Secondary | ICD-10-CM | POA: Diagnosis not present

## 2024-06-15 DIAGNOSIS — G894 Chronic pain syndrome: Secondary | ICD-10-CM | POA: Diagnosis not present

## 2024-06-15 DIAGNOSIS — Z79891 Long term (current) use of opiate analgesic: Secondary | ICD-10-CM | POA: Diagnosis not present

## 2024-07-04 ENCOUNTER — Ambulatory Visit (INDEPENDENT_AMBULATORY_CARE_PROVIDER_SITE_OTHER)

## 2024-07-04 VITALS — Ht 66.0 in | Wt 214.0 lb

## 2024-07-04 DIAGNOSIS — Z0000 Encounter for general adult medical examination without abnormal findings: Secondary | ICD-10-CM | POA: Diagnosis not present

## 2024-07-04 NOTE — Progress Notes (Signed)
 Subjective:   Rhonda Hester is a 48 y.o. female who presents for Medicare Annual (Subsequent) preventive examination.  Visit Complete: Virtual I connected with  Rhonda Hester on 07/04/24 by a audio enabled telemedicine application and verified that I am speaking with the correct person using two identifiers.  Patient Location: Home  Provider Location: Office/Clinic  I discussed the limitations of evaluation and management by telemedicine. The patient expressed understanding and agreed to proceed.  Vital Signs: Because this visit was a virtual/telehealth visit, some criteria may be missing or patient reported. Any vitals not documented were not able to be obtained and vitals that have been documented are patient reported.  Patient Medicare AWV questionnaire was completed by the patient on 07/04/2024; I have confirmed that all information answered by patient is correct and no changes since this date.        Objective:    There were no vitals filed for this visit. There is no height or weight on file to calculate BMI.     03/22/2024    7:38 AM 03/21/2024    8:13 AM 03/06/2024    7:31 PM 04/04/2023    1:00 PM 12/30/2022    4:02 PM 09/22/2021    8:48 PM 09/16/2021    3:53 PM  Advanced Directives  Does Patient Have a Medical Advance Directive? No No No -- No No No  Would patient like information on creating a medical advance directive? No - Patient declined No - Patient declined No - Patient declined   No - Patient declined No - Patient declined    Current Medications (verified) Outpatient Encounter Medications as of 07/04/2024  Medication Sig   Adalimumab 40 MG/0.8ML PNKT Inject 40 mg into the skin every 14 (fourteen) days.   aluminum -magnesium  hydroxide-simethicone  (MAALOX) 200-200-20 MG/5ML SUSP Take 15 mLs by mouth 3 (three) times daily as needed.   Blood Glucose Monitoring Suppl DEVI 1 each by Does not apply route 2 (two) times daily. May substitute to any manufacturer  covered by patient's insurance.   ciprofloxacin  (CIPRO ) 500 MG tablet Take 1 tablet (500 mg total) by mouth every 12 (twelve) hours. (Patient not taking: Reported on 04/20/2024)   clobetasol  ointment (TEMOVATE ) 0.05 % Apply 1 Application topically 2 (two) times daily. Apply to affected areas twice daily then stop. Alternate with Pimecrolimus  cream   cyclobenzaprine  (FLEXERIL ) 5 MG tablet Take 1 tablet (5 mg total) by mouth 3 (three) times daily as needed. (Patient not taking: Reported on 02/28/2024)   DULERA 200-5 MCG/ACT AERO Inhale 2 puffs into the lungs 2 (two) times daily.   fluconazole  (DIFLUCAN ) 150 MG tablet Take 1 tablet (150 mg total) by mouth daily. (Patient not taking: Reported on 02/28/2024)   Glucose Blood (BLOOD GLUCOSE TEST STRIPS) STRP 1 each by In Vitro route 2 (two) times daily. May substitute to any manufacturer covered by patient's insurance.   hyoscyamine  (ANASPAZ ) 0.125 MG TBDP disintergrating tablet Place 1 tablet (0.125 mg total) under the tongue every 6 (six) hours as needed.   ketorolac  (TORADOL ) 10 MG tablet Take 1 tablet (10 mg total) by mouth every 6 (six) hours as needed.   metFORMIN  (GLUCOPHAGE -XR) 500 MG 24 hr tablet Take 1 tablet (500 mg total) by mouth 2 (two) times daily with a meal.   oxyCODONE  (ROXICODONE ) 15 MG immediate release tablet Take 15 mg by mouth 4 (four) times daily.   oxyCODONE  (ROXICODONE ) 5 MG immediate release tablet Take 1 tablet (5 mg total) by mouth every  8 (eight) hours as needed.   pimecrolimus  (ELIDEL ) 1 % cream APPLY   TOPICALLY TWICE DAILY   pregabalin (LYRICA) 25 MG capsule Take 25 mg by mouth 3 (three) times daily. (Patient not taking: Reported on 02/28/2024)   promethazine  (PHENERGAN ) 25 MG tablet Take 1 tablet (25 mg total) by mouth every 6 (six) hours as needed for nausea or vomiting.   rosuvastatin  (CRESTOR ) 10 MG tablet Take 1 tablet (10 mg total) by mouth daily.   tamsulosin  (FLOMAX ) 0.4 MG CAPS capsule Take 1 capsule (0.4 mg total) by  mouth daily after supper. (Patient not taking: Reported on 03/15/2024)   tamsulosin  (FLOMAX ) 0.4 MG CAPS capsule Take 1 capsule (0.4 mg total) by mouth at bedtime.   Vitamin D , Ergocalciferol , (DRISDOL ) 1.25 MG (50000 UNIT) CAPS capsule Take 1 capsule (50,000 Units total) by mouth every 7 (seven) days. (Patient not taking: Reported on 03/16/2024)   [DISCONTINUED] famotidine  (PEPCID ) 20 MG tablet Take 1 tablet (20 mg total) by mouth 2 (two) times daily.   Facility-Administered Encounter Medications as of 07/04/2024  Medication   bupivacaine  (MARCAINE ) 0.5 % 15 mL, phenazopyridine  (PYRIDIUM ) 400 mg bladder mixture    Allergies (verified) Acyclovir  and related, Bee pollen, Citric acid, Dust mite extract, Grapeseed extract [nutritional supplements], Pineapple, Pollen extract, and Nickel   History: Past Medical History:  Diagnosis Date   Allergic asthma    Allergy     SEASONAL   Anxiety    Arthritis    in shoulder   Breast mass 05/2017   lt breast lump   Complication of anesthesia    woke up during D&C   Diabetes mellitus without complication (HCC)    Eczema    Fibroid    GERD (gastroesophageal reflux disease)    Heart murmur    as child per pt   History of anal fissures    History of Bell's palsy    2006  RIGHT SIDE--  RESOLVED   History of gastroesophageal reflux (GERD)    History of kidney stones    Hyperlipidemia    Interstitial cystitis    Psoriasis    Sarcoidosis of lung (HCC)    DX 2006--  PULMOLOGIST--  DR TZMU- on 2 liters of oxygen , goes to pain clinic   Seasonal allergic rhinitis    Shortness of breath    Vaginal Pap smear, abnormal    Past Surgical History:  Procedure Laterality Date   BREAST CYST ASPIRATION Left    CYSTO WITH HYDRODISTENSION N/A 08/24/2013   Procedure: CYSTOSCOPY/HYDRODISTENSION with instillation of marcaine  and pyridium ;  Surgeon: Donnice Gwenyth Brooks, MD;  Location: [facility];  Service: Urology;  Laterality: N/A;    CYSTOSCOPY W/ RETROGRADES Right 03/22/2024   Procedure: CYSTOSCOPY, WITH RETROGRADE PYELOGRAM;  Surgeon: Elisabeth Valli BIRCH, MD;  Location: [facility];  Service: Urology;  Laterality: Right;   CYSTOSCOPY/ HYDRODISTENTION/ BLADDER BX  06/09/2010   CYSTOSCOPY/URETEROSCOPY/HOLMIUM LASER/STENT PLACEMENT Right 01/30/2019   Procedure: CYSTOSCOPY/RETROGRADE/URETEROSCOPY/HOLMIUM LASER/STENT PLACEMENT;  Surgeon: Brooks Donnice, MD;  Location: [facility];  Service: Urology;  Laterality: Right;   CYSTOSCOPY/URETEROSCOPY/HOLMIUM LASER/STENT PLACEMENT Right 03/22/2024   Procedure: CYSTOSCOPY/URETEROSCOPY/HOLMIUM LASER/STENT PLACEMENT;  Surgeon: Elisabeth Valli BIRCH, MD;  Location: [facility];  Service: Urology;  Laterality: Right;  CYSTOSCOPY/RIGHT URETEROSCOPY/HOLMIUM LASER LITHOTRIPSY/STENT PLACEMENT/RIGHT RETROGRADE PYELOGRAM   DILATION AND CURETTAGE OF UTERUS  12/28/1995   EXTRACORPOREAL SHOCK WAVE LITHOTRIPSY Right 11/30/2018   Procedure: EXTRACORPOREAL SHOCK WAVE LITHOTRIPSY (ESWL);  Surgeon: Gaston Hamilton, MD;  Location: [facility];  Service: Urology;  Laterality:  Right;   IR TRANSCATHETER BX  06/14/2018   IR US  GUIDE VASC ACCESS RIGHT  06/14/2018   IR VENOGRAM HEPATIC W HEMODYNAMIC EVALUATION  06/14/2018   Family History  Problem Relation Age of Onset   Brain cancer Maternal Grandmother    Hyperlipidemia Other    Sleep apnea Other    Depression Maternal Aunt    Seizures Maternal Uncle    Colon cancer Neg Hx    Stomach cancer Neg Hx    Social History   Socioeconomic History   Marital status: Single    Spouse name: Not on file   Number of children: 0   Years of education: Not on file   Highest education level: Some college, no degree  Occupational History   Occupation: disabled  Tobacco Use   Smoking status: Every Day    Types: Cigars   Smokeless tobacco: Never   Tobacco comments:    Smokes 1 black and mild occasionally  Vaping Use   Vaping status: Never Used  Substance and Sexual Activity    Alcohol use: No    Alcohol/week: 0.0 standard drinks of alcohol   Drug use: No    Comment: HX MARIJUANA USE   Sexual activity: Not Currently    Birth control/protection: Injection    Comment: abstinence   Other Topics Concern   Not on file  Social History Narrative   Lives alone.   Social Drivers of Corporate investment banker Strain: Low Risk  (12/30/2022)   Overall Financial Resource Strain (CARDIA)    Difficulty of Paying Living Expenses: Not hard at all  Food Insecurity: No Food Insecurity (03/15/2024)   Hunger Vital Sign    Worried About Running Out of Food in the Last Year: Never true    Ran Out of Food in the Last Year: Never true  Transportation Needs: No Transportation Needs (03/15/2024)   PRAPARE - Administrator, Civil Service (Medical): No    Lack of Transportation (Non-Medical): No  Physical Activity: Inactive (12/30/2022)   Exercise Vital Sign    Days of Exercise per Week: 0 days    Minutes of Exercise per Session: 0 min  Stress: Stress Concern Present (12/30/2022)   [employer] of Occupational Health - Occupational Stress Questionnaire    Feeling of Stress : To some extent  Social Connections: Socially Isolated (09/16/2021)   Social Connection and Isolation Panel    Frequency of Communication with Friends and Family: Once a week    Frequency of Social Gatherings with Friends and Family: Once a week    Attends Religious Services: Never    Database administrator or Organizations: No    Attends Engineer, structural: Never    Marital Status: Never married    Tobacco Counseling Ready to quit: Not Answered Counseling given: Not Answered Tobacco comments: Smokes 1 black and mild occasionally   Clinical Intake:                        Activities of Daily Living    03/21/2024    8:16 AM 03/21/2024    8:12 AM  In your present state of health, do you have any difficulty performing the following activities:  Hearing?  0   Vision?  0  Difficulty concentrating or making decisions?  0  Doing errands, shopping? 0     Patient Care Team: Oley Bascom RAMAN, NP as PCP - General (Pulmonary Disease) Jeffrie Oneil BROCKS, MD  as PCP - Cardiology (Cardiology) Rollene Faden, MD as Consulting Physician (Chiropractic Medicine)  Indicate any recent Medical Services you may have received from other than [facility] providers in the past year (date may be approximate).     Assessment:   This is a routine wellness examination for Safiyya.  Hearing/Vision screen No results found.   Goals Addressed   None    Depression Screen    04/20/2024   10:59 AM 10/26/2023    9:39 AM 07/21/2023   11:05 AM 04/04/2023    2:00 PM 03/24/2023    2:44 PM 12/30/2022    4:05 PM 10/18/2022    1:09 PM  PHQ 2/9 Scores  PHQ - 2 Score 0 0 0 0 0 4 0  PHQ- 9 Score  0    10     Fall Risk    10/26/2023    9:39 AM 04/04/2023    2:00 PM 12/30/2022    4:04 PM 10/18/2022    1:08 PM 07/22/2022   11:19 AM  Fall Risk   Falls in the past year? 1 -- 0 0 0  Comment  pt declined to answer     Number falls in past yr: 0  0 0 0  Injury with Fall? 0  0 0 0  Risk for fall due to :   Impaired balance/gait;Medication side effect;Impaired mobility No Fall Risks No Fall Risks  Follow up   Falls prevention discussed;Education provided;Falls evaluation completed   Falls evaluation completed      Data saved with a previous flowsheet row definition    MEDICARE RISK AT HOME:    TIMED UP AND GO:  Was the test performed?  No    Cognitive Function:        12/30/2022    4:15 PM 09/16/2021    3:31 PM  6CIT Screen  What Year? 0 points 0 points  What month? 0 points 0 points  What time? 0 points 0 points  Count back from 20 0 points 0 points  Months in reverse 4 points 0 points  Repeat phrase 6 points 0 points  Total Score 10 points 0 points    Immunizations Immunization History  Administered Date(s) Administered   Dtap, Unspecified 03/25/1977,  06/15/1977, 12/02/1977, 04/05/1979, 05/14/1981   HPV 9-valent 11/15/2019   Influenza Whole 09/02/2011, 09/26/2012   Influenza,inj,Quad PF,6+ Mos 12/27/2012, 09/04/2015, 09/20/2017, 08/10/2018, 08/27/2019, 11/20/2019   Influenza-Unspecified 12/27/2012, 09/04/2015, 09/20/2017, 08/10/2018   Measles 04/29/1978   Mumps 04/29/1978   Pneumococcal Conjugate-13 11/14/2018   Pneumococcal Polysaccharide-23 05/10/2016   Polio, Unspecified 03/25/1977, 06/15/1977, 12/02/1977, 04/29/1978, 04/05/1979, 05/14/1981   Rubella 04/29/1978   Tdap 05/10/2016    TDAP status: Due, Education has been provided regarding the importance of this vaccine. Advised may receive this vaccine at local pharmacy or Health Dept. Aware to provide a copy of the vaccination record if obtained from local pharmacy or Health Dept. Verbalized acceptance and understanding.  Flu Vaccine status: Declined, Education has been provided regarding the importance of this vaccine but patient still declined. Advised may receive this vaccine at local pharmacy or Health Dept. Aware to provide a copy of the vaccination record if obtained from local pharmacy or Health Dept. Verbalized acceptance and understanding.  Pneumococcal vaccine status: Declined,  Education has been provided regarding the importance of this vaccine but patient still declined. Advised may receive this vaccine at local pharmacy or Health Dept. Aware to provide a copy of the vaccination record if obtained from local pharmacy or  Health Dept. Verbalized acceptance and understanding.   Covid-19 vaccine status: Declined, Education has been provided regarding the importance of this vaccine but patient still declined. Advised may receive this vaccine at local pharmacy or Health Dept.or vaccine clinic. Aware to provide a copy of the vaccination record if obtained from local pharmacy or Health Dept. Verbalized acceptance and understanding.  Qualifies for Shingles Vaccine? No   Zostavax  completed No   Shingrix Completed?: No.    Education has been provided regarding the importance of this vaccine. Patient has been advised to call insurance company to determine out of pocket expense if they have not yet received this vaccine. Advised may also receive vaccine at local pharmacy or Health Dept. Verbalized acceptance and understanding.  Screening Tests Health Maintenance  Topic Date Due   COVID-19 Vaccine (1) Never done   FOOT EXAM  Never done   OPHTHALMOLOGY EXAM  Never done   Hepatitis B Vaccines (1 of 3 - 19+ 3-dose series) Never done   HPV VACCINES (2 - Risk 3-dose series) 12/13/2019   Pneumococcal Vaccine 60-56 Years old (3 of 3 - PCV20 or PCV21) 05/10/2021   Colonoscopy  09/22/2023   Medicare Annual Wellness (AWV)  12/31/2023   INFLUENZA VACCINE  07/27/2024   HEMOGLOBIN A1C  09/22/2024   Diabetic kidney evaluation - Urine ACR  02/27/2025   Diabetic kidney evaluation - eGFR measurement  03/13/2025   MAMMOGRAM  04/24/2025   DTaP/Tdap/Td (7 - Td or Tdap) 05/10/2026   Cervical Cancer Screening (HPV/Pap Cotest)  07/07/2026   Hepatitis C Screening  Completed   HIV Screening  Completed   Meningococcal B Vaccine  Aged Out    Health Maintenance  Health Maintenance Due  Topic Date Due   COVID-19 Vaccine (1) Never done   FOOT EXAM  Never done   OPHTHALMOLOGY EXAM  Never done   Hepatitis B Vaccines (1 of 3 - 19+ 3-dose series) Never done   HPV VACCINES (2 - Risk 3-dose series) 12/13/2019   Pneumococcal Vaccine 25-24 Years old (3 of 3 - PCV20 or PCV21) 05/10/2021   Colonoscopy  09/22/2023   Medicare Annual Wellness (AWV)  12/31/2023    Colorectal cancer screening: Type of screening: Colonoscopy. Completed 09/21/2018. Repeat every 5 years  Mammogram status: Completed 04/24/2024. Repeat every year    Lung Cancer Screening: (Low Dose CT Chest recommended if Age 32-80 years, 20 pack-year currently smoking OR have quit w/in 15years.) does not qualify.   Lung Cancer  Screening Referral: N/A  Additional Screening:  Hepatitis C Screening: does not qualify; Completed 04/25/2018  Vision Screening: Recommended annual ophthalmology exams for early detection of glaucoma and other disorders of the eye. Is the patient up to date with their annual eye exam?  Yes  Who is the provider or what is the name of the office in which the patient attends annual eye exams? [address] not sure If pt is not established with a provider, would they like to be referred to a provider to establish care? No .   Dental Screening: Recommended annual dental exams for proper oral hygiene  Diabetic Foot Exam: Diabetic Foot Exam: Overdue, Pt has been advised about the importance in completing this exam. Pt is scheduled for diabetic foot exam on 07/20/24.  Community Resource Referral / Chronic Care Management: CRR required this visit?  No   CCM required this visit?  No     Plan:     I have personally reviewed and noted the following in  the patient's chart:   Medical and social history Use of alcohol, tobacco or illicit drugs  Current medications and supplements including opioid prescriptions. Patient is currently taking opioid prescriptions.Functional ability and status Nutritional status Physical activity Advanced directives List of other physicians Hospitalizations, surgeries, and ER visits in previous 12 months Vitals Screenings to include cognitive, depression, and falls Referrals and appointments  In addition, I have reviewed and discussed with patient certain preventive protocols, quality metrics, and best practice recommendations. A written personalized care plan for preventive services as well as general preventive health recommendations were provided to patient.     Suzen Shove, RMA   07/04/2024   After Visit Summary: (MyChart) Due to this being a telephonic visit, the after visit summary with patients personalized plan was offered to patient via MyChart    Nurse Notes: Thank you for  your time.

## 2024-07-13 DIAGNOSIS — G894 Chronic pain syndrome: Secondary | ICD-10-CM | POA: Diagnosis not present

## 2024-07-13 DIAGNOSIS — D860 Sarcoidosis of lung: Secondary | ICD-10-CM | POA: Diagnosis not present

## 2024-07-13 DIAGNOSIS — Z79891 Long term (current) use of opiate analgesic: Secondary | ICD-10-CM | POA: Diagnosis not present

## 2024-07-14 DIAGNOSIS — R51 Headache with orthostatic component, not elsewhere classified: Secondary | ICD-10-CM | POA: Diagnosis not present

## 2024-07-14 DIAGNOSIS — M5136 Other intervertebral disc degeneration, lumbar region with discogenic back pain only: Secondary | ICD-10-CM | POA: Diagnosis not present

## 2024-07-14 DIAGNOSIS — G894 Chronic pain syndrome: Secondary | ICD-10-CM | POA: Diagnosis not present

## 2024-07-14 DIAGNOSIS — L409 Psoriasis, unspecified: Secondary | ICD-10-CM | POA: Diagnosis not present

## 2024-07-20 ENCOUNTER — Encounter: Payer: Self-pay | Admitting: Nurse Practitioner

## 2024-07-20 ENCOUNTER — Ambulatory Visit (INDEPENDENT_AMBULATORY_CARE_PROVIDER_SITE_OTHER): Payer: Self-pay | Admitting: Nurse Practitioner

## 2024-07-20 VITALS — BP 111/74 | HR 96 | Temp 97.8°F | Wt 216.8 lb

## 2024-07-20 DIAGNOSIS — M25512 Pain in left shoulder: Secondary | ICD-10-CM

## 2024-07-20 DIAGNOSIS — E1165 Type 2 diabetes mellitus with hyperglycemia: Secondary | ICD-10-CM

## 2024-07-20 DIAGNOSIS — E785 Hyperlipidemia, unspecified: Secondary | ICD-10-CM

## 2024-07-20 DIAGNOSIS — G8929 Other chronic pain: Secondary | ICD-10-CM

## 2024-07-20 LAB — POCT GLYCOSYLATED HEMOGLOBIN (HGB A1C): Hemoglobin A1C: 7.7 % — AB (ref 4.0–5.6)

## 2024-07-20 MED ORDER — ROSUVASTATIN CALCIUM 10 MG PO TABS: 10.0000 mg | ORAL_TABLET | Freq: Every day | ORAL | 0 refills | Status: DC

## 2024-07-20 MED ORDER — KETOROLAC TROMETHAMINE 30 MG/ML IJ SOLN
30.0000 mg | Freq: Once | INTRAMUSCULAR | Status: AC
  Administered 2024-07-20: 30 mg via INTRAMUSCULAR

## 2024-07-20 NOTE — Progress Notes (Signed)
 Subjective   Patient ID: Rhonda Hester, female    DOB: Apr 26, 1976, 48 y.o.   MRN: [id number]  Chief Complaint  Patient presents with   Diabetes    Follow up    Shoulder Pain    Left shoulder pain x1 year     Referring provider: Oley Bascom RAMAN, NP  Rhonda Hester is a 48 y.o. female with Past Medical History: No date: Allergic asthma No date: Allergy      Comment:  SEASONAL No date: Anxiety No date: Arthritis     Comment:  in shoulder 05/2017: Breast mass     Comment:  lt breast lump No date: Complication of anesthesia     Comment:  woke up during D&C No date: Diabetes mellitus without complication (HCC) No date: Eczema No date: Fibroid No date: GERD (gastroesophageal reflux disease) No date: Heart murmur     Comment:  as child per pt No date: History of anal fissures No date: History of Bell's palsy     Comment:  2006  RIGHT SIDE--  RESOLVED No date: History of gastroesophageal reflux (GERD) No date: History of kidney stones No date: Hyperlipidemia No date: Interstitial cystitis No date: Psoriasis No date: Sarcoidosis of lung (HCC)     Comment:  DX 2006--  PULMOLOGIST--  DR Rhonda Hester- on 2 liters of               oxygen , goes to pain clinic No date: Seasonal allergic rhinitis No date: Shortness of breath No date: Vaginal Pap smear, abnormal   HPI  Patient presents today for follow-up on diabetes.  She has started walking routine. A1C is 7.7. non compliant with medications. Has not been taking metformin  or crestor . Denies f/c/s, n/v/d, hemoptysis, PND, leg swelling. Denies chest pain or edema.   Note: Patient complains of ongoing pain to left shoulder.  She was previously seen by Ortho for this but did not like the office.  We will place a new referral for her today.  We will give her Toradol  injection in office today.  Allergies  Allergen Reactions   Acyclovir  And Related Other (See Comments)    Patient states it made me feel like I can't breath     Bee Pollen Itching and Cough     coughing   Citric Acid Other (See Comments)    AVOIDS DUE TO INTERSTITIAL CYSTITIS   Dust Mite Extract Itching and Cough     coughing   Grapeseed Extract [Nutritional Supplements] Itching    Throat itching and burning   Pineapple Itching    Burning in mouth and tingling lips   Pollen Extract Itching and Other (See Comments)     coughing   Nickel Rash    Immunization History  Administered Date(s) Administered   Dtap, Unspecified 03/25/1977, 06/15/1977, 12/02/1977, 04/05/1979, 05/14/1981   HPV 9-valent 11/15/2019   Influenza Whole 09/02/2011, 09/26/2012   Influenza,inj,Quad PF,6+ Mos 12/27/2012, 09/04/2015, 09/20/2017, 08/10/2018, 08/27/2019, 11/20/2019   Influenza-Unspecified 12/27/2012, 09/04/2015, 09/20/2017, 08/10/2018   Measles 04/29/1978   Mumps 04/29/1978   Pneumococcal Conjugate-13 11/14/2018   Pneumococcal Polysaccharide-23 05/10/2016   Polio, Unspecified 03/25/1977, 06/15/1977, 12/02/1977, 04/29/1978, 04/05/1979, 05/14/1981   Rubella 04/29/1978   Tdap 05/10/2016    Tobacco History: Social History   Tobacco Use  Smoking Status Every Day   Types: Cigars  Smokeless Tobacco Never  Tobacco Comments   Smokes 1 black and mild occasionally   Ready to quit: Not Answered Counseling given: Yes Tobacco comments: Smokes 1 black  and mild occasionally   Outpatient Encounter Medications as of 07/20/2024  Medication Sig   Adalimumab 40 MG/0.8ML PNKT Inject 40 mg into the skin every 14 (fourteen) days.   aluminum -magnesium  hydroxide-simethicone  (MAALOX) 200-200-20 MG/5ML SUSP Take 15 mLs by mouth 3 (three) times daily as needed.   Blood Glucose Monitoring Suppl DEVI 1 each by Does not apply route 2 (two) times daily. May substitute to any manufacturer covered by patient's insurance.   clobetasol  ointment (TEMOVATE ) 0.05 % Apply 1 Application topically 2 (two) times daily. Apply to affected areas twice daily then stop. Alternate with  Pimecrolimus  cream   DULERA 200-5 MCG/ACT AERO Inhale 2 puffs into the lungs 2 (two) times daily.   Glucose Blood (BLOOD GLUCOSE TEST STRIPS) STRP 1 each by In Vitro route 2 (two) times daily. May substitute to any manufacturer covered by patient's insurance.   oxyCODONE  (ROXICODONE ) 15 MG immediate release tablet Take 15 mg by mouth 4 (four) times daily.   pimecrolimus  (ELIDEL ) 1 % cream APPLY   TOPICALLY TWICE DAILY   ciprofloxacin  (CIPRO ) 500 MG tablet Take 1 tablet (500 mg total) by mouth every 12 (twelve) hours. (Patient not taking: Reported on 07/04/2024)   cyclobenzaprine  (FLEXERIL ) 5 MG tablet Take 1 tablet (5 mg total) by mouth 3 (three) times daily as needed. (Patient not taking: Reported on 07/04/2024)   fluconazole  (DIFLUCAN ) 150 MG tablet Take 1 tablet (150 mg total) by mouth daily. (Patient not taking: Reported on 07/04/2024)   hyoscyamine  (ANASPAZ ) 0.125 MG TBDP disintergrating tablet Place 1 tablet (0.125 mg total) under the tongue every 6 (six) hours as needed. (Patient not taking: Reported on 07/04/2024)   ketorolac  (TORADOL ) 10 MG tablet Take 1 tablet (10 mg total) by mouth every 6 (six) hours as needed. (Patient not taking: Reported on 07/04/2024)   metFORMIN  (GLUCOPHAGE -XR) 500 MG 24 hr tablet Take 1 tablet (500 mg total) by mouth 2 (two) times daily with a meal. (Patient not taking: Reported on 07/04/2024)   oxyCODONE  (ROXICODONE ) 5 MG immediate release tablet Take 1 tablet (5 mg total) by mouth every 8 (eight) hours as needed.   pregabalin (LYRICA) 25 MG capsule Take 25 mg by mouth 3 (three) times daily. (Patient not taking: Reported on 07/04/2024)   promethazine  (PHENERGAN ) 25 MG tablet Take 1 tablet (25 mg total) by mouth every 6 (six) hours as needed for nausea or vomiting. (Patient not taking: Reported on 07/04/2024)   rosuvastatin  (CRESTOR ) 10 MG tablet Take 1 tablet (10 mg total) by mouth daily.   tamsulosin  (FLOMAX ) 0.4 MG CAPS capsule Take 1 capsule (0.4 mg total) by mouth daily after  supper. (Patient not taking: Reported on 07/04/2024)   tamsulosin  (FLOMAX ) 0.4 MG CAPS capsule Take 1 capsule (0.4 mg total) by mouth at bedtime. (Patient not taking: Reported on 07/04/2024)   Vitamin D , Ergocalciferol , (DRISDOL ) 1.25 MG (50000 UNIT) CAPS capsule Take 1 capsule (50,000 Units total) by mouth every 7 (seven) days. (Patient not taking: Reported on 07/04/2024)   [DISCONTINUED] famotidine  (PEPCID ) 20 MG tablet Take 1 tablet (20 mg total) by mouth 2 (two) times daily.   [DISCONTINUED] rosuvastatin  (CRESTOR ) 10 MG tablet Take 1 tablet (10 mg total) by mouth daily. (Patient not taking: Reported on 07/04/2024)   Facility-Administered Encounter Medications as of 07/20/2024  Medication   bupivacaine  (MARCAINE ) 0.5 % 15 mL, phenazopyridine  (PYRIDIUM ) 400 mg bladder mixture   ketorolac  (TORADOL ) 30 MG/ML injection 30 mg    Review of Systems  Review of Systems  Constitutional:  Negative.   HENT: Negative.    Cardiovascular: Negative.   Gastrointestinal: Negative.   Allergic/Immunologic: Negative.   Neurological: Negative.   Psychiatric/Behavioral: Negative.       Objective:   BP 111/74   Pulse 96   Temp 97.8 F (36.6 C) (Oral)   Wt 216 lb 12.8 oz (98.3 kg)   SpO2 94%   BMI 34.99 kg/m   Wt Readings from Last 5 Encounters:  07/20/24 216 lb 12.8 oz (98.3 kg)  07/04/24 214 lb (97.1 kg)  04/20/24 214 lb (97.1 kg)  03/22/24 220 lb (99.8 kg)  03/21/24 220 lb (99.8 kg)     Physical Exam Vitals and nursing note reviewed.  Constitutional:      General: She is not in acute distress.    Appearance: She is well-developed.  Cardiovascular:     Rate and Rhythm: Normal rate and regular rhythm.  Pulmonary:     Effort: Pulmonary effort is normal.     Breath sounds: Normal breath sounds.  Neurological:     Mental Status: She is alert and oriented to person, place, and time.       Assessment & Plan:   Type 2 diabetes mellitus with hyperglycemia, without long-term current use of  insulin  (HCC) -     POCT glycosylated hemoglobin (Hb A1C) -     AMB Referral [facility] Care Management  Dyslipidemia, goal LDL below 70 -     Rosuvastatin  Calcium ; Take 1 tablet (10 mg total) by mouth daily.  Dispense: 90 tablet; Refill: 0 -     AMB Referral [facility] Care Management  Chronic left shoulder pain -     Ketorolac  Tromethamine  -     Ambulatory referral to Orthopedic Surgery     Return in about 3 months (around 10/20/2024).   Bascom GORMAN Borer, NP 07/20/2024

## 2024-07-24 ENCOUNTER — Telehealth: Payer: Self-pay | Admitting: *Deleted

## 2024-07-24 NOTE — Progress Notes (Signed)
 Care Guide Pharmacy Note  07/24/2024 Name: JOYCEANN KRUSER MRN: [id number] DOB: 18-Aug-1976  Referred By: Oley Bascom RAMAN, NP Reason for referral: Complex Care Management (Initial outreach to schedule referral with PharmD )   SHEREE LALLA is a 48 y.o. year old female who is a primary care patient of Oley Bascom RAMAN, NP.  RONNY RUDDELL was referred to the pharmacist for assistance related to: HLD and DMII  An unsuccessful telephone outreach was attempted today to contact the patient who was referred to the pharmacy team for assistance with medication management. Additional attempts will be made to contact the patient.  Harlene Satterfield  [facility] Guide  Direct Dial: [phone]  Fax [phone]

## 2024-07-24 NOTE — Progress Notes (Signed)
 Care Guide Pharmacy Note  07/24/2024 Name: ZYNIA WOJTOWICZ MRN: [id number] DOB: Mar 29, 1976  Referred By: Oley Bascom RAMAN, NP Reason for referral: Complex Care Management (Initial outreach to schedule referral with PharmD )   TAEJA DEBELLIS is a 48 y.o. year old female who is a primary care patient of Oley Bascom RAMAN, NP.  DEVYNE HAUGER was referred to the pharmacist for assistance related to: HLD and DMII  Successful contact was made with the patient to discuss pharmacy services including being ready for the pharmacist to call at least 5 minutes before the scheduled appointment time and to have medication bottles and any blood pressure readings ready for review. The patient agreed to meet with the pharmacist via telephone visit on (date/time). 08/06/24 at 230 PM   Harlene Satterfield  [facility] Guide  Direct Dial: [phone]  Fax [phone]

## 2024-08-06 ENCOUNTER — Other Ambulatory Visit (HOSPITAL_COMMUNITY): Payer: Self-pay

## 2024-08-06 ENCOUNTER — Other Ambulatory Visit: Payer: Self-pay

## 2024-08-06 DIAGNOSIS — E1165 Type 2 diabetes mellitus with hyperglycemia: Secondary | ICD-10-CM

## 2024-08-06 NOTE — Progress Notes (Signed)
 08/06/2024 Name: Rhonda Hester MRN: [id number] DOB: [birth year]  Chief Complaint  Patient presents with   Diabetes   Hyperlipidemia    [name] is a 48 y.o. year old female who presented for a telephone visit.   They were referred to the pharmacist by their PCP for assistance in managing diabetes and medication access.  PMH includes sarcoidosis of lung, asthma, T2DM, TMJ, BMI > 30, depression, tobacco use, HLD   Subjective: Patient was last seen by PCP, Rhonda Hester, on 07/20/24. At last visit, patient's A1C had improved from 9.5% to 7.7%. She reported that she had not been taking her rosuvastatin  or metformin .  Today, patient reports doing ok. She does not recall scheduling this appt and is hesitant to complete it today. However, she agrees to talk for a short period of time. She reports that she has metformin  at home but she has not started taking it. The only medication she is taking is her Humira.    Care Team: Primary Care Provider: Borer Rhonda RAMAN, NP ; Next Scheduled Visit: 10/22/24   Medication Access/Adherence  Current Pharmacy:  [facility] [address] ([address]), [place] - [address] [place] ([place]) [place] [address] Phone: [phone] Fax: [phone]  [name], [place], [name] - [address] [place] [address] Phone: [phone] Fax: [phone]   Patient reports affordability concerns with their medications: No  - test claim for Farxiga $0. Unsure whether she has Medicare or Medicaid.   Patient reports access/transportation concerns to their pharmacy: No   Patient reports adherence concerns with their medications:  Yes  - she does not like taking oral medications. Felt that she had fatigue and dry mouth when taking all her medications. She reports she never received rosuvastatin  from the pharmacy.   Diabetes:  Current medications: metformin  XR 500 mg BID (not taking,  but she has at home and plans to start taking) Medications tried in the past: N/A  Current glucose readings: she is checking once daily - did not have glucometer to review values Using Accu Chek Guide meter  Patient denies hypoglycemic s/sx including dizziness, shakiness, sweating. Patient denies hyperglycemic symptoms including polyuria, polydipsia, polyphagia, nocturia, neuropathy, blurred vision.  Current meal patterns: Reports her mother helps her make healthy food choices.  Current physical activity: did not discuss today. Patient reports she has lost a lot of weight with healthier lifestyle.   Current medication access support: none  Hyperlipidemia/ASCVD Risk Reduction  Current lipid lowering medications: rosuvastatin  10 mg daily (not taking, reports she never got from the pharmacy)  Antiplatelet regimen: none  ASCVD History: cMRI in March 2024 - minimal < 25% soft plaque in distal RCA. No plaque otherwise. CAC 0. Family History: no cardiac fam hx listed in chart Risk Factors: T2DM  Clinical ASCVD: No  The 10-year ASCVD risk score (Arnett DK, et al., 2019) is: 3.8%   Values used to calculate the score:     Age: 1 years     Clincally relevant sex: Female     Is Non-Hispanic African American: Yes     Diabetic: Yes     Tobacco smoker: Yes     Systolic Blood Pressure: 111 mmHg     Is BP treated: No     HDL Cholesterol: 55 mg/dL     Total Cholesterol: 218 mg/dL    Objective:  BP Readings from Last 3 Encounters:  07/20/24 111/74  04/20/24 (!) 139/90  03/22/24 (!) 154/83    Lab Results  Component Value Date   HGBA1C 7.7 (A) 07/20/2024   HGBA1C 9.5 (H) 03/22/2024   HGBA1C 10.5 (A) 02/28/2024       Latest Ref Rng & Units 03/13/2024    2:58 PM 03/06/2024    7:48 PM 12/12/2023    7:15 PM  BMP  Glucose 70 - 99 mg/dL 834  811  813   BUN 6 - 20 mg/dL 9  7  10    Creatinine 0.44 - 1.00 mg/dL 9.31  9.29  9.45   Sodium 135 - 145 mmol/L 138  137  136   Potassium 3.5 -  5.1 mmol/L 3.6  3.5  3.8   Chloride 98 - 111 mmol/L 106  103  108   CO2 22 - 32 mmol/L 17  21  18    Calcium  8.9 - 10.3 mg/dL 9.6  89.9  9.3     Lab Results  Component Value Date   CHOL 218 (H) 01/26/2024   HDL 55 01/26/2024   LDLCALC 139 (H) 01/26/2024   TRIG 136 01/26/2024   CHOLHDL 4.0 01/26/2024    Medications Reviewed Today     Reviewed by Brinda Lorain SQUIBB, RPH (Pharmacist) on 08/06/24 at 1459  Med List Status: <None>   Medication Order Taking? Sig Documenting Provider Last Dose Status Informant  Adalimumab 40 MG/0.8ML PNKT [id number] Yes Inject 40 mg into the skin every 14 (fourteen) days. [provider]  Active Self    Discontinued 08/06/24 1456 (Expired Prescription) Blood Glucose Monitoring Suppl DEVI [id number]  1 each by Does not apply route 2 (two) times daily. May substitute to any manufacturer covered by patient's insurance. Paseda, Folashade R, FNP  Active Self     Discontinued 08/06/24 1459    Patient not taking:   Discontinued 08/06/24 1437 (Completed Course) clobetasol  ointment (TEMOVATE ) 0.05 % [id number]  Apply 1 Application topically 2 (two) times daily. Apply to affected areas twice daily then stop. Alternate with Pimecrolimus  cream Alm Delon SAILOR, DO  Active Self   Patient not taking:   Discontinued 08/06/24 1456 (Completed Course) DULERA 200-5 MCG/ACT AERO [id number]  Inhale 2 puffs into the lungs 2 (two) times daily. [provider]  Active Self    Discontinued 12/22/19 1554    Patient not taking:   Discontinued 08/06/24 1456 (Completed Course) Glucose Blood (BLOOD GLUCOSE TEST STRIPS) STRP [id number]  1 each by In Vitro route 2 (two) times daily. May substitute to any manufacturer covered by patient's insurance. Paseda, Folashade R, FNP  Active Self   Discontinued 08/06/24 1457 (Completed Course)    Patient not taking:   Discontinued 08/06/24 1457 (Completed Course) metFORMIN  (GLUCOPHAGE -XR) 500 MG 24 hr tablet [id number]  Take 1 tablet (500 mg total)  by mouth 2 (two) times daily with a meal.  Patient not taking: Reported on 08/06/2024   Oley Rhonda RAMAN, NP  Active   oxyCODONE  (ROXICODONE ) 15 MG immediate release tablet [id number] Yes Take 15 mg by mouth 4 (four) times daily. [provider]  Active Self    Discontinued 08/06/24 1440 (Patient has not taken in last 30 days)   pimecrolimus  (ELIDEL ) 1 % cream [id number]  APPLY   TOPICALLY TWICE DAILY Alm Delon SAILOR, DO  Active    Patient not taking:   Discontinued 08/06/24 1457 (Expired Prescription)  Patient not taking:   Discontinued 08/06/24 1457 (Completed Course)          Med Note (LAWSON,  BROOKE C   Fri Mar 16, 2024  1:02 PM) Has not taken yet  rosuvastatin  (CRESTOR ) 10 MG tablet [id number]  Take 1 tablet (10 mg total) by mouth daily.  Patient not taking: Reported on 08/06/2024   Oley Rhonda RAMAN, NP  Active    Patient not taking:   Discontinued 08/06/24 1458 (Completed Course)  Patient not taking:   Discontinued 08/06/24 1458 (Completed Course)   Vitamin D , Ergocalciferol , (DRISDOL ) 1.25 MG (50000 UNIT) CAPS capsule [id number]  Take 1 capsule (50,000 Units total) by mouth every 7 (seven) days.  Patient not taking: Reported on 08/06/2024   Oley Rhonda RAMAN, NP  Active Self              Assessment/Plan:   Diabetes: - Currently uncontrolled with most recent A1C of 7.7% above goal <7%, but improved March 2025 with lifestyle changes despite medication nonadherence. She is willing to start taking metformin  XR. Discussed additional diet and lifestyle interventions at length. In the future, could discuss transitioning to once weekly GLP-RA if she would prefer this for adherence - BMI > 30.  - Last UACR March 2025: 127 mg/g - patient is a good candidate for an SGLT2i, but hesitant to increase pill burden at this time.  - Reviewed long term cardiovascular and renal outcomes of uncontrolled blood sugar - Reviewed goal A1c, goal fasting, and goal 2 hour post prandial glucose -  Reviewed dietary modifications including  utilizing the healthy plate method, limiting portion size of carbohydrate foods, increasing intake of protein and non-starchy vegetables. Counseled patient to stay hydrated with water  throughout the day. - Reviewed lifestyle modifications including: aiming for 150 minutes of moderate intensity exercise every week.  - Recommend to start metformin  XR 500 mg BID  - Recommend to check glucose twice daily: fasting and 2-hr PPG . Counseled patient to bring glucometer or BG log to every appointment. - Next A1C due October 2025     Hyperlipidemia/ASCVD Risk Reduction: - Currently uncontrolled with most recent LDL-C of 139 mg/dL above goal < 70 mg/dL given U7IF + comorbidities. No significant plaque or calcium  on cMRI in March 2024. Moderate intensity statin indicated - however, patient does not want to start taking at this time. Agreed to have her focus on adherence to one oral medication, metformin , then discuss starting medication for cholesterol after next PCP appt.  - Reviewed long term complications of uncontrolled cholesterol - Reviewed lifestyle recommendations to lower LDL-C including regular physical activity, 5-10% weight loss, eating a diet low in saturated fat, and increasing intake of fiber to at least 25 g per day. - Recommend to recheck lipid panel at PCP appt Oct 2025   Patient verbalized understanding of treatment plan.   Follow Up Plan:  Pharmacist telephone 09/19/24 PCP clinic visit in 10/22/24   Lorain Baseman, PharmD [facility] [phone]

## 2024-08-13 DIAGNOSIS — D860 Sarcoidosis of lung: Secondary | ICD-10-CM | POA: Diagnosis not present

## 2024-08-14 DIAGNOSIS — M25551 Pain in right hip: Secondary | ICD-10-CM | POA: Diagnosis not present

## 2024-08-14 DIAGNOSIS — M79671 Pain in right foot: Secondary | ICD-10-CM | POA: Diagnosis not present

## 2024-08-14 DIAGNOSIS — R519 Headache, unspecified: Secondary | ICD-10-CM | POA: Diagnosis not present

## 2024-08-14 DIAGNOSIS — M5136 Other intervertebral disc degeneration, lumbar region with discogenic back pain only: Secondary | ICD-10-CM | POA: Diagnosis not present

## 2024-08-14 DIAGNOSIS — Z79891 Long term (current) use of opiate analgesic: Secondary | ICD-10-CM | POA: Diagnosis not present

## 2024-08-14 DIAGNOSIS — G894 Chronic pain syndrome: Secondary | ICD-10-CM | POA: Diagnosis not present

## 2024-08-20 DIAGNOSIS — K760 Fatty (change of) liver, not elsewhere classified: Secondary | ICD-10-CM | POA: Diagnosis not present

## 2024-08-20 DIAGNOSIS — N200 Calculus of kidney: Secondary | ICD-10-CM | POA: Diagnosis not present

## 2024-08-20 DIAGNOSIS — D8689 Sarcoidosis of other sites: Secondary | ICD-10-CM | POA: Diagnosis not present

## 2024-08-22 ENCOUNTER — Ambulatory Visit (INDEPENDENT_AMBULATORY_CARE_PROVIDER_SITE_OTHER): Payer: Self-pay

## 2024-08-22 DIAGNOSIS — Z30019 Encounter for initial prescription of contraceptives, unspecified: Secondary | ICD-10-CM

## 2024-08-22 MED ORDER — TESTOSTERONE CYPIONATE 200 MG/ML IM SOLN
150.0000 mg | INTRAMUSCULAR | Status: AC
  Administered 2024-08-22: 150 mg via INTRAMUSCULAR

## 2024-08-22 NOTE — Progress Notes (Signed)
 Patient is here for her 3 month depo injection

## 2024-09-14 DIAGNOSIS — N130 Hydronephrosis with ureteropelvic junction obstruction: Secondary | ICD-10-CM | POA: Diagnosis not present

## 2024-09-18 DIAGNOSIS — M545 Low back pain, unspecified: Secondary | ICD-10-CM | POA: Diagnosis not present

## 2024-09-18 DIAGNOSIS — G894 Chronic pain syndrome: Secondary | ICD-10-CM | POA: Diagnosis not present

## 2024-09-18 DIAGNOSIS — M5136 Other intervertebral disc degeneration, lumbar region with discogenic back pain only: Secondary | ICD-10-CM | POA: Diagnosis not present

## 2024-09-19 ENCOUNTER — Other Ambulatory Visit (INDEPENDENT_AMBULATORY_CARE_PROVIDER_SITE_OTHER): Payer: Self-pay

## 2024-09-19 DIAGNOSIS — E785 Hyperlipidemia, unspecified: Secondary | ICD-10-CM

## 2024-09-19 NOTE — Progress Notes (Signed)
 09/19/2024 Name: Rhonda Hester MRN: [id number] DOB: 02-15-1976  Chief Complaint  Patient presents with   Diabetes   Hyperlipidemia    Rhonda Hester is a 48 y.o. year old female who presented for a telephone visit.   They were referred to the pharmacist by their PCP for assistance in managing diabetes and medication access.  PMH includes sarcoidosis of lung, asthma, T2DM, TMJ, BMI > 30, depression, tobacco use, HLD   Subjective: Patient was last seen by PCP, Rhonda Hester, on 07/20/24. At last visit, patient's A1C had improved from 9.5% to 7.7%. She reported that she had not been taking her rosuvastatin  or metformin . At pharmacy telephone appt on 08/06/24, patient reported doing ok. She was hesitant to discuss her medications with me. She reported she had metformin  at home, but had not started taking it. She reported the only medication she was taking was Humira. She was agreeable to a trial of metformin  XR 500 mg BID. She preferred to recheck lipid panel before initiating statin therapy.  Today, patient reports doing ok. She has been taking metformin  intermittently.    Care Team: Primary Care Provider: Borer Rhonda RAMAN, NP ; Next Scheduled Visit: 10/22/24   Medication Access/Adherence  Current Pharmacy:  [facility] [address] ([place]), [place] - [address] [place] ([place]) [place] [address] Phone: [phone] Fax: [phone]  [name], [name], [name] - [address] [place] [address] Phone: [phone] Fax: [phone]   Patient reports affordability concerns with their medications: No  - test claim for Farxiga $0. Appears she has [facility] Dual Complete  Patient reports access/transportation concerns to their pharmacy: No   Patient reports adherence concerns with their medications:  Yes  - she does not like taking oral medications. Felt that she had fatigue and dry mouth when taking all her  medications.    Diabetes:  Current medications: metformin  XR 500 mg BID (reports she is taking it ~3 times per week, usually only one tablet) Medications tried in the past: N/A  Current glucose readings: she is checking once daily - Using Accu Chek Guide meter. Reports she has not been checking regularly because her fingertips are numb. Last night it was 135 mg/dL (87:62JF) 0/80/74: 885 mg/dL (6PM) 0/2/84: 810 at 7PM 09/03/24: 211 at 3 AM)  Patient denies hypoglycemic s/sx including dizziness, shakiness, sweating. Patient denies hyperglycemic symptoms including polyuria, polydipsia, polyphagia, nocturia, neuropathy.Reports occasional blurred vision when she wakes up in the AM.  Current meal patterns: Reports her mother helps her make healthy food choices. Reports she is still prioritizing vegetables. Endorses protein intake included breaded fish (not a lot of other meat). Does report occasional sweets including mini candy bar like three musketeers. Drinks: Reports that she is drinking water  throughout the day, Cranberry/grape juice - 28g carb/servings Snacks: sometimes has meal replacement drink   Current physical activity: Walks her dog regularly. Patient reports she has lost a lot of weight with healthier lifestyle. Reports weight is down to 112 lbs. Reports pain in her Hester leg is limiting her mobility.  Current medication access support: none  Hyperlipidemia/ASCVD Risk Reduction  Current lipid lowering medications: rosuvastatin  10 mg daily (not taking, reports she never got from the pharmacy)  Antiplatelet regimen: none  ASCVD History: cMRI in March 2024 - minimal < 25% soft plaque in distal RCA. No plaque otherwise. CAC 0. Family History: no cardiac fam hx listed in chart  Risk Factors: T2DM  Clinical ASCVD: No  The 10-year ASCVD risk score (Arnett DK, et al., 2019) is: 6.7%   Values used to calculate the score:     Age: 6 years     Clincally relevant sex: Female     Is  Non-Hispanic African American: Yes     Diabetic: Yes     Tobacco smoker: Yes     Systolic Blood Pressure: 127 mmHg     Is BP treated: No     HDL Cholesterol: 55 mg/dL     Total Cholesterol: 218 mg/dL    Objective:  BP Readings from Last 3 Encounters:  07/20/24 111/74  04/20/24 (!) 139/90  03/22/24 (!) 154/83    Lab Results  Component Value Date   HGBA1C 7.7 (A) 07/20/2024   HGBA1C 9.5 (H) 03/22/2024   HGBA1C 10.5 (A) 02/28/2024       Latest Ref Rng & Units 03/13/2024    2:58 PM 03/06/2024    7:48 PM 12/12/2023    7:15 PM  BMP  Glucose 70 - 99 mg/dL 834  811  813   BUN 6 - 20 mg/dL 9  7  10    Creatinine 0.44 - 1.00 mg/dL 9.31  9.29  9.45   Sodium 135 - 145 mmol/L 138  137  136   Potassium 3.5 - 5.1 mmol/L 3.6  3.5  3.8   Chloride 98 - 111 mmol/L 106  103  108   CO2 22 - 32 mmol/L 17  21  18    Calcium  8.9 - 10.3 mg/dL 9.6  89.9  9.3     Lab Results  Component Value Date   CHOL 218 (H) 01/26/2024   HDL 55 01/26/2024   LDLCALC 139 (H) 01/26/2024   TRIG 136 01/26/2024   CHOLHDL 4.0 01/26/2024    Medications Reviewed Today     Reviewed by Rhonda Hester, RPH (Pharmacist) on 09/19/24 at 1644  Med List Status: <None>   Medication Order Taking? Sig Documenting Provider Last Dose Status Informant  Adalimumab 40 MG/0.8ML PNKT [id number] Yes Inject 40 mg into the skin every 14 (fourteen) days. [provider]  Active Self  Blood Glucose Monitoring Suppl DEVI [id number]  1 each by Does not apply route 2 (two) times daily. May substitute to any manufacturer covered by patient's insurance. Hester, Rhonda R, FNP  Active Self  clobetasol  ointment (TEMOVATE ) 0.05 % [id number]  Apply 1 Application topically 2 (two) times daily. Apply to affected areas twice daily then stop. Alternate with Pimecrolimus  cream Rhonda Hester SAILOR, DO  Active Self  DULERA 200-5 MCG/ACT Rhonda Hester [id number] Yes Inhale 2 puffs into the lungs 2 (two) times daily. [provider]  Active Self     Discontinued 12/22/19 1554   Glucose Blood (BLOOD GLUCOSE TEST STRIPS) STRP [id number]  1 each by In Vitro route 2 (two) times daily. May substitute to any manufacturer covered by patient's insurance. Hester, Rhonda R, FNP  Active Self  metFORMIN  (GLUCOPHAGE -XR) 500 MG 24 hr tablet [id number] Yes Take 1 tablet (500 mg total) by mouth 2 (two) times daily with a meal.  Patient taking differently: Take 1 tablet (500 mg total) by mouth 2 (two) times daily with a meal.   Oley Rhonda RAMAN, NP  Active   oxyCODONE  (ROXICODONE ) 15 MG immediate release tablet [id number] Yes Take 15 mg by mouth 4 (four) times daily. [provider]  Active Self  pimecrolimus  (ELIDEL ) 1 % cream [id number]  APPLY   TOPICALLY TWICE  DAILY Rhonda Hester SAILOR, DO  Active   testosterone  cypionate (DEPOTESTOSTERONE CYPIONATE) injection 150 mg [id number]   Nichols, Tonya S, NP  Active   Vitamin D , Ergocalciferol , (DRISDOL ) 1.25 MG (50000 UNIT) CAPS capsule [id number]  Take 1 capsule (50,000 Units total) by mouth every 7 (seven) days.  Patient not taking: Reported on 08/06/2024   Oley Rhonda RAMAN, NP  Active Self              Assessment/Plan:   Diabetes: - Currently uncontrolled with most recent A1C of 7.7% above goal <7%, but improved from 9.7% in March 2025 with lifestyle changes. Encouraged adherence to metformin  and discussed reducing intake of juice to improve BP.  In the future, could discuss transitioning to once weekly GLP-RA if she would prefer this for adherence - BMI > 30.  - Last UACR March 2025: 127 mg/g - patient is a good candidate for an SGLT2i, but hesitant to increase pill burden at this time.  - Reviewed long term cardiovascular and renal outcomes of uncontrolled blood sugar - Reviewed goal A1c, goal fasting, and goal 2 hour post prandial glucose - Reviewed dietary modifications including  utilizing the healthy plate method, limiting portion size of carbohydrate foods, increasing intake of protein and  non-starchy vegetables. Counseled patient to stay hydrated with water  throughout the day. Discussed replacing juice with zero sugar option.  - Reviewed lifestyle modifications including: aiming for 150 minutes of moderate intensity exercise every week.  - Recommend to continue metformin  XR 500 mg BID  - Recommend to check glucose twice daily: fasting and 2-hr PPG . Counseled patient to bring glucometer or BG log to every appointment. - Next A1C due October 2025     Hyperlipidemia/ASCVD Risk Reduction: - Currently uncontrolled with most recent LDL-C of 139 mg/dL above goal < 70 mg/dL given U7IF + comorbidities. No significant plaque or calcium  on cMRI in March 2024. Moderate intensity statin indicated - however, patient does not want to start taking at this time. Agreed to have her focus on adherence to one oral medication, metformin , then discuss starting medication for cholesterol after next PCP appt.  - Reviewed long term complications of uncontrolled cholesterol - Reviewed lifestyle recommendations to lower LDL-C including regular physical activity, 5-10% weight loss, eating a diet low in saturated fat, and increasing intake of fiber to at least 25 g per day. - Recommend to recheck lipid panel at PCP appt Oct 2025   Patient verbalized understanding of treatment plan.   Follow Up Plan:  Pharmacist telephone 11/06/24 PCP clinic visit in 10/22/24   Lorain Baseman, PharmD [facility] [phone]

## 2024-10-02 DIAGNOSIS — D860 Sarcoidosis of lung: Secondary | ICD-10-CM | POA: Diagnosis not present

## 2024-10-11 ENCOUNTER — Telehealth: Payer: Self-pay

## 2024-10-11 DIAGNOSIS — E1165 Type 2 diabetes mellitus with hyperglycemia: Secondary | ICD-10-CM

## 2024-10-11 MED ORDER — METFORMIN HCL ER 500 MG PO TB24: 500.0000 mg | ORAL_TABLET | Freq: Two times a day (BID) | ORAL | 3 refills | Status: AC

## 2024-10-11 NOTE — Progress Notes (Signed)
 This patient is appearing on a report for being at risk of failing the adherence measure for diabetes medications this calendar year.   Medication: metformin  XR 500 mg BID Last fill date: 09/24/24 for 30 day supply  Currently following with this patient - she does not take metformin  consistently. Have continued to emphasize adherence Collaborated with PCP to resend Rx for metformin  today to fill for 90 day supply at next fill Next pharmacy telephone f/u on 11/06/24  Lorain Baseman, PharmD [facility] [phone]

## 2024-10-13 DIAGNOSIS — D860 Sarcoidosis of lung: Secondary | ICD-10-CM | POA: Diagnosis not present

## 2024-10-19 DIAGNOSIS — M25551 Pain in right hip: Secondary | ICD-10-CM | POA: Diagnosis not present

## 2024-10-19 DIAGNOSIS — M5136 Other intervertebral disc degeneration, lumbar region with discogenic back pain only: Secondary | ICD-10-CM | POA: Diagnosis not present

## 2024-10-19 DIAGNOSIS — L409 Psoriasis, unspecified: Secondary | ICD-10-CM | POA: Diagnosis not present

## 2024-10-19 DIAGNOSIS — R51 Headache with orthostatic component, not elsewhere classified: Secondary | ICD-10-CM | POA: Diagnosis not present

## 2024-10-22 ENCOUNTER — Encounter: Payer: Self-pay | Admitting: Nurse Practitioner

## 2024-10-22 ENCOUNTER — Ambulatory Visit (INDEPENDENT_AMBULATORY_CARE_PROVIDER_SITE_OTHER): Payer: Self-pay | Admitting: Nurse Practitioner

## 2024-10-22 VITALS — BP 122/66 | HR 92

## 2024-10-22 DIAGNOSIS — R221 Localized swelling, mass and lump, neck: Secondary | ICD-10-CM

## 2024-10-22 DIAGNOSIS — Z1329 Encounter for screening for other suspected endocrine disorder: Secondary | ICD-10-CM

## 2024-10-22 DIAGNOSIS — E785 Hyperlipidemia, unspecified: Secondary | ICD-10-CM | POA: Diagnosis not present

## 2024-10-22 DIAGNOSIS — E1165 Type 2 diabetes mellitus with hyperglycemia: Secondary | ICD-10-CM

## 2024-10-22 LAB — POCT GLYCOSYLATED HEMOGLOBIN (HGB A1C): Hemoglobin A1C: 7.3 % — AB (ref 4.0–5.6)

## 2024-10-22 NOTE — Progress Notes (Signed)
 Subjective   Patient ID: Rhonda Hester, female    DOB: 11-16-1976, 48 y.o.   MRN: [id number]  Chief Complaint  Patient presents with   Hemoglobin A1c Screening   Hyperlipidemia    Referring provider: Oley Rhonda RAMAN, NP  Rhonda Hester is a 48 y.o. female with Past Medical History: No date: Allergic asthma No date: Allergy      Comment:  SEASONAL No date: Anxiety No date: Arthritis     Comment:  in shoulder 05/2017: Breast mass     Comment:  lt breast lump No date: Complication of anesthesia     Comment:  woke up during D&C No date: Diabetes mellitus without complication (HCC) No date: Eczema No date: Fibroid No date: GERD (gastroesophageal reflux disease) No date: Heart murmur     Comment:  as child per pt No date: History of anal fissures No date: History of Bell's palsy     Comment:  2006  RIGHT SIDE--  RESOLVED No date: History of gastroesophageal reflux (GERD) No date: History of kidney stones No date: Hyperlipidemia No date: Interstitial cystitis No date: Psoriasis No date: Sarcoidosis of lung     Comment:  DX 2006--  PULMOLOGIST--  DR TZMU- on 2 liters of               oxygen , goes to pain clinic No date: Seasonal allergic rhinitis No date: Shortness of breath No date: Vaginal Pap smear, abnormal   HPI  Patient presents today for follow-up on diabetes.  She has started walking routine. A1C is 7.3. non compliant with medications. Has not been taking metformin  or crestor .  Pharmacy is following for diabetic medication management and hyperlipidemia.  Patient states she noticed she has been having some swelling in her neck around her thyroid .  We will order ultrasound to check thyroid  level in office today.  Denies f/c/s, n/v/d, hemoptysis, PND, leg swelling. Denies chest pain or edema.     Allergies  Allergen Reactions   Acyclovir  And Related Other (See Comments)    Patient states it made me feel like I can't breath    Bee Pollen Itching and  Cough     coughing   Citric Acid Other (See Comments)    AVOIDS DUE TO INTERSTITIAL CYSTITIS   Dust Mite Extract Itching and Cough     coughing   Grapeseed Extract [Nutritional Supplements] Itching    Throat itching and burning   Pineapple Itching    Burning in mouth and tingling lips   Pollen Extract Itching and Other (See Comments)     coughing   Nickel Rash    Immunization History  Administered Date(s) Administered   Dtap, Unspecified 03/25/1977, 06/15/1977, 12/02/1977, 04/05/1979, 05/14/1981   HPV 9-valent 11/15/2019   Influenza Whole 09/02/2011, 09/26/2012   Influenza,inj,Quad PF,6+ Mos 12/27/2012, 09/04/2015, 09/20/2017, 08/10/2018, 08/27/2019, 11/20/2019   Influenza-Unspecified 12/27/2012, 09/04/2015, 09/20/2017, 08/10/2018   Measles 04/29/1978   Mumps 04/29/1978   Pneumococcal Conjugate-13 11/14/2018   Pneumococcal Polysaccharide-23 05/10/2016   Polio, Unspecified 03/25/1977, 06/15/1977, 12/02/1977, 04/29/1978, 04/05/1979, 05/14/1981   Rubella 04/29/1978   Tdap 05/10/2016    Tobacco History: Social History   Tobacco Use  Smoking Status Every Day   Types: Cigars  Smokeless Tobacco Never  Tobacco Comments   Smokes 1 black and mild occasionally   Ready to quit: Not Answered Counseling given: Not Answered Tobacco comments: Smokes 1 black and mild occasionally   Outpatient Encounter Medications as of 10/22/2024  Medication Sig   Adalimumab  40 MG/0.8ML PNKT Inject 40 mg into the skin every 14 (fourteen) days.   Blood Glucose Monitoring Suppl DEVI 1 each by Does not apply route 2 (two) times daily. May substitute to any manufacturer covered by patient's insurance.   clobetasol  ointment (TEMOVATE ) 0.05 % Apply 1 Application topically 2 (two) times daily. Apply to affected areas twice daily then stop. Alternate with Pimecrolimus  cream   DULERA 200-5 MCG/ACT AERO Inhale 2 puffs into the lungs 2 (two) times daily.   Glucose Blood (BLOOD GLUCOSE TEST STRIPS) STRP 1  each by In Vitro route 2 (two) times daily. May substitute to any manufacturer covered by patient's insurance.   metFORMIN  (GLUCOPHAGE -XR) 500 MG 24 hr tablet Take 1 tablet (500 mg total) by mouth 2 (two) times daily with a meal.   oxyCODONE  (ROXICODONE ) 15 MG immediate release tablet Take 15 mg by mouth 4 (four) times daily.   pimecrolimus  (ELIDEL ) 1 % cream APPLY   TOPICALLY TWICE DAILY   Vitamin D , Ergocalciferol , (DRISDOL ) 1.25 MG (50000 UNIT) CAPS capsule Take 1 capsule (50,000 Units total) by mouth every 7 (seven) days. (Patient not taking: Reported on 10/22/2024)   [DISCONTINUED] famotidine  (PEPCID ) 20 MG tablet Take 1 tablet (20 mg total) by mouth 2 (two) times daily.   Facility-Administered Encounter Medications as of 10/22/2024  Medication   testosterone  cypionate (DEPOTESTOSTERONE CYPIONATE) injection 150 mg    Review of Systems  Review of Systems  Constitutional: Negative.   HENT: Negative.    Cardiovascular: Negative.   Gastrointestinal: Negative.   Allergic/Immunologic: Negative.   Neurological: Negative.   Psychiatric/Behavioral: Negative.       Objective:   BP 122/66 (BP Location: Left Arm, Patient Position: Sitting, Cuff Size: Large)   Pulse 92   SpO2 98%   Wt Readings from Last 5 Encounters:  07/20/24 216 lb 12.8 oz (98.3 kg)  07/04/24 214 lb (97.1 kg)  04/20/24 214 lb (97.1 kg)  03/22/24 220 lb (99.8 kg)  03/21/24 220 lb (99.8 kg)     Physical Exam Vitals and nursing note reviewed.  Constitutional:      General: She is not in acute distress.    Appearance: She is well-developed.  Cardiovascular:     Rate and Rhythm: Normal rate and regular rhythm.  Pulmonary:     Effort: Pulmonary effort is normal.     Breath sounds: Normal breath sounds.  Neurological:     Mental Status: She is alert and oriented to person, place, and time.       Assessment & Plan:   Hyperlipidemia, unspecified hyperlipidemia type  Type 2 diabetes mellitus with  hyperglycemia, without long-term current use of insulin  (HCC) -     POCT glycosylated hemoglobin (Hb A1C)  Thyroid  disorder screen -     TSH  Neck swelling -     US  THYROID      No follow-ups on file.   Rhonda GORMAN Borer, NP 10/22/2024

## 2024-10-23 LAB — TSH: TSH: 1.75 u[IU]/mL (ref 0.450–4.500)

## 2024-10-24 ENCOUNTER — Ambulatory Visit: Payer: Self-pay | Admitting: Nurse Practitioner

## 2024-10-25 ENCOUNTER — Ambulatory Visit
Admission: RE | Admit: 2024-10-25 | Discharge: 2024-10-25 | Disposition: A | Discharge status: Home / Self Care | Source: Ambulatory Visit | Attending: Nurse Practitioner | Admitting: Nurse Practitioner

## 2024-10-25 DIAGNOSIS — R221 Localized swelling, mass and lump, neck: Secondary | ICD-10-CM | POA: Diagnosis not present

## 2024-10-29 ENCOUNTER — Telehealth: Payer: Self-pay

## 2024-10-29 ENCOUNTER — Ambulatory Visit: Payer: Self-pay

## 2024-10-29 NOTE — Telephone Encounter (Signed)
 FYI Only or Action Required?: FYI only for provider: Referred to UC.  Patient was last seen in primary care on 10/22/2024 by Oley Bascom RAMAN, NP.  Called Nurse Triage reporting Mass.  Symptoms began yesterday.  Interventions attempted: Nothing.  Symptoms are: gradually worsening.  Triage Disposition: See HCP Within 4 Hours (Or PCP Triage)  Patient/caregiver understands and will follow disposition?: Yes  **See note below**                     Copied from CRM [id number]. Topic: Clinical - Red Word Triage >> Oct 29, 2024  9:07 AM Rhonda Hester wrote: Rhonda Hester that prompted transfer to Nurse Triage: Patient called in stated she has blister in her ear, and having difficulty hearing out of it    ----------------------------------------------------------------------- From previous Reason for Contact - Scheduling: Patient/patient representative is calling to schedule an appointment. Refer to attachments for appointment information. Reason for Disposition  [1] Hearing loss in one or both ears AND [2] sudden onset AND [3] present now  Answer Assessment - Initial Assessment Questions 1. DESCRIPTION: What type of hearing problem are you having? Describe it for me. (e.g., complete hearing loss, partial loss)      Partial hearing loss  2. LOCATION: One or both ears? If one, ask: Which ear?     Left ear   3. SEVERITY: Can you hear anything? If Yes, ask: What can you hear? (e.g., ticking watch, whisper, talking)     Faint   4. ONSET: When did this begin? Did it start suddenly or come on gradually?     X 1 day ago, she noticed the symptoms   5. PATTERN: Does this come and go, or has it been constant since it started?     Constant   6. PAIN: Is there any pain in your ear(s)?  (Scale 0-10; or none, mild, moderate, severe)     7/10  7. CAUSE: What do you think is causing this hearing problem?     Bump in inner left ear  8. OTHER SYMPTOMS: Do you have  any other symptoms? (e.g., dizziness, ringing in ears)  No    Patient called in to report a bump in her left ear, she stated the bump is in her inner ear, fluid filled, she states the size is large. Ear swelling noted. Referred to UC as there is no appt. Availability per Epic until 11/14; patient agrees with disposition.  Protocols used: Hearing Loss or Change-A-AH

## 2024-10-29 NOTE — Telephone Encounter (Signed)
 Called patient to give lab results, LVM for patient to give us  a call back

## 2024-10-29 NOTE — Telephone Encounter (Unsigned)
 Copied from CRM [id number]. Topic: Clinical - Red Word Triage >> Oct 29, 2024  9:07 AM Thersia BROCKS wrote: [facility] that prompted transfer to Nurse Triage: Patient called in stated she has blister in her ear, and having difficulty hearing out of it    ----------------------------------------------------------------------- From previous Reason for Contact - Scheduling: Patient/patient representative is calling to schedule an appointment. Refer to attachments for appointment information.

## 2024-10-29 NOTE — Telephone Encounter (Signed)
-----   Message from Bascom GORMAN Borer sent at 10/29/2024  9:10 AM EST ----- No thyroid  nodules noted on ultrasound ----- Message ----- From: Interface, Rad Results In Sent: 10/27/2024   7:33 AM EST To: Bascom GORMAN Borer, NP

## 2024-10-30 NOTE — Telephone Encounter (Signed)
 Pt was advise to go to UC. Rhonda Hester

## 2024-11-03 ENCOUNTER — Encounter (HOSPITAL_BASED_OUTPATIENT_CLINIC_OR_DEPARTMENT_OTHER): Payer: Self-pay

## 2024-11-03 ENCOUNTER — Emergency Department (HOSPITAL_BASED_OUTPATIENT_CLINIC_OR_DEPARTMENT_OTHER)
Admission: EM | Admit: 2024-11-03 | Discharge: 2024-11-03 | Disposition: A | Discharge status: Home / Self Care | Attending: Emergency Medicine | Admitting: Emergency Medicine

## 2024-11-03 ENCOUNTER — Other Ambulatory Visit: Payer: Self-pay

## 2024-11-03 DIAGNOSIS — H9192 Unspecified hearing loss, left ear: Secondary | ICD-10-CM | POA: Insufficient documentation

## 2024-11-03 DIAGNOSIS — H9202 Otalgia, left ear: Secondary | ICD-10-CM | POA: Diagnosis present

## 2024-11-03 DIAGNOSIS — R590 Localized enlarged lymph nodes: Secondary | ICD-10-CM | POA: Insufficient documentation

## 2024-11-03 NOTE — ED Provider Notes (Signed)
 [place] EMERGENCY DEPARTMENT AT [facility] Provider Note   CSN: [id number] Arrival date & time: 11/03/24  1726     Patient presents with: Rhonda Hester is a 48 y.o. female.  {Add pertinent medical, surgical, social history, OB history to YEP:67052} Patient is here for left-sided hearing loss.  Ear pain started on October 30 and included the outer ear with swelling.  She began using eardrops previously prescribed for an ear infection earlier this year.  She then used an ear camera to look inside of her ear on November 2 when she developed hearing loss out of this year.  She also states the outside and entrance to the ear was too tender to have used the camera prior to this day.  She met with her ENT on November 4 who recommended she continue said eyedrops for 1 week.  She reports ear pain has improved, however she is concerned about continued hearing loss.  She last used her eardrops yesterday evening.  She reports when she uses the eardrops she puts them in at bedtime and lays on her right side because she does not want the eardrops to drain out and not be effective.  Otherwise she will stick cloth into her ear to keep the eardrops in as well.  She is not sure the name of the eardrops she has been using; she thought these drops were in her pocket however she is not sure where they are now.  Prior to October 30, she admits to using Q-tips in an effort to clear hard earwax.  She had tubes in her ears as a child, otherwise denies surgery to the left ear.  She denies any recent injuries to the left ear.  She denies draining from the left ear other than eardrops fluid.  Although she goes on to say on November 2 when she stuck the ear camera into the left ear something popped that she believed to be some type of bump.  She continues to have mild pain by the left jaw.  She last saw her dentist 1 month ago.  Denies fever, headaches, tooth pain, pain with chewing, or recent upper  respiratory infection.  Reviewed ENT note on November 3.  She was diagnosed with left acute otitis externa with evidence of acute canal infection being left ear canal wall swelling and tenderness without pustule.  She was recommended to continue Ciprodex  drops at home dosing twice daily for 7 days.  The history is provided by the patient.  Otalgia Location:  Left Context: not direct blow, not elevation change, not recent URI and not water  in ear   Associated symptoms: hearing loss   Associated symptoms: no abdominal pain, no diarrhea, no fever, no headaches, no neck pain, no rhinorrhea, no sore throat and no vomiting   Risk factors: chronic ear infection and prior ear surgery        Prior to Admission medications   Medication Sig Start Date End Date Taking? Authorizing Provider  Adalimumab 40 MG/0.8ML PNKT Inject 40 mg into the skin every 14 (fourteen) days. 04/06/22   [provider]  Blood Glucose Monitoring Suppl DEVI 1 each by Does not apply route 2 (two) times daily. May substitute to any manufacturer covered by patient's insurance. 02/28/24   Paseda, Folashade R, FNP  clobetasol  ointment (TEMOVATE ) 0.05 % Apply 1 Application topically 2 (two) times daily. Apply to affected areas twice daily then stop. Alternate with Pimecrolimus  cream 11/16/23   Alm Nest  N, DO  DULERA 200-5 MCG/ACT AERO Inhale 2 puffs into the lungs 2 (two) times daily. 03/29/20   [provider]  Glucose Blood (BLOOD GLUCOSE TEST STRIPS) STRP 1 each by In Vitro route 2 (two) times daily. May substitute to any manufacturer covered by patient's insurance. 02/28/24 07/12/25  Paseda, Folashade R, FNP  metFORMIN  (GLUCOPHAGE -XR) 500 MG 24 hr tablet Take 1 tablet (500 mg total) by mouth 2 (two) times daily with a meal. 10/11/24   Oley Bascom RAMAN, NP  oxyCODONE  (ROXICODONE ) 15 MG immediate release tablet Take 15 mg by mouth 4 (four) times daily. 02/18/24   [provider]  pimecrolimus  (ELIDEL ) 1 %  cream APPLY   TOPICALLY TWICE DAILY 03/26/24   Alm Delon SAILOR, DO  Vitamin D , Ergocalciferol , (DRISDOL ) 1.25 MG (50000 UNIT) CAPS capsule Take 1 capsule (50,000 Units total) by mouth every 7 (seven) days. Patient not taking: Reported on 10/22/2024 01/27/24   Oley Bascom RAMAN, NP  famotidine  (PEPCID ) 20 MG tablet Take 1 tablet (20 mg total) by mouth 2 (two) times daily. 05/24/19 12/01/20  Vicenta Maduro, FNP    Allergies: Acyclovir  and related, Bee pollen, Citric acid, Dust mite extract, Grapeseed extract [nutritional supplements], Pineapple, Pollen extract, and Nickel    Review of Systems  Constitutional:  Negative for fever.  HENT:  Positive for ear pain and hearing loss. Negative for rhinorrhea and sore throat.   Gastrointestinal:  Negative for abdominal pain, diarrhea and vomiting.  Musculoskeletal:  Negative for neck pain.  Neurological:  Negative for headaches.    Updated Vital Signs BP (!) 140/97   Pulse 93   Temp 98 F (36.7 C)   Resp 16   Ht 5' 7 (1.702 m)   Wt 102.1 kg   SpO2 97%   BMI 35.24 kg/m   Physical Exam Vitals and nursing note reviewed.  Constitutional:      General: She is not in acute distress.    Appearance: Normal appearance. She is obese. She is not ill-appearing.  HENT:     Head: Normocephalic and atraumatic.     Right Ear: Tympanic membrane, ear canal and external ear normal. There is no impacted cerumen.     Left Ear: Tympanic membrane, ear canal and external ear normal. There is no impacted cerumen.     Nose: Nose normal. No congestion or rhinorrhea.     Mouth/Throat:     Mouth: Mucous membranes are moist.     Pharynx: No oropharyngeal exudate or posterior oropharyngeal erythema.  Eyes:     Extraocular Movements: Extraocular movements intact.  Cardiovascular:     Rate and Rhythm: Normal rate and regular rhythm.  Pulmonary:     Effort: Pulmonary effort is normal. No respiratory distress.     Breath sounds: No stridor. No wheezing, rhonchi or  rales.  Skin:    General: Skin is warm and dry.     Coloration: Skin is not pale.  Neurological:     Mental Status: She is alert and oriented to person, place, and time.     (all labs ordered are listed, but only abnormal results are displayed) Labs Reviewed - No data to display  EKG: None  Radiology: No results found.  {Document cardiac monitor, telemetry assessment procedure when appropriate:32947} Procedures   Medications Ordered in the ED - No data to display    Patient presents to the ED for concern of ***, this involves an extensive number of treatment options, and is a complaint that carries  with it a high risk of complications and morbidity.  The differential diagnosis includes ***   Co morbidities that complicate the patient evaluation  ***   Additional history obtained:  Additional history obtained from *** {Blank multiple:19196::EMS,Family,Nursing,Outside Medical Records,Past Admission}   External records from outside source obtained and reviewed including ***   Lab Tests:  I Ordered, and personally interpreted labs.  The pertinent results include:  ***   Imaging Studies ordered:  I ordered imaging studies including ***  I independently visualized and interpreted imaging which showed *** I agree with the radiologist interpretation   Cardiac Monitoring:  The patient was maintained on a cardiac monitor.  I personally viewed and interpreted the cardiac monitored which showed an underlying rhythm of: ***   Medicines ordered and prescription drug management:  I ordered medication including ***  for ***  Reevaluation of the patient after these medicines showed that the patient {resolved/improved/worsened:23923::improved} I have reviewed the patients home medicines and have made adjustments as needed   Test Considered:  ***   Critical Interventions:  ***   Consultations Obtained:  I requested consultation with the ***,  and  discussed lab and imaging findings as well as pertinent plan - they recommend: ***   Problem List / ED Course:  ***   Reevaluation:  After the interventions noted above, I reevaluated the patient and found that they have :{resolved/improved/worsened:23923::improved}   Social Determinants of Health:  ***   Dispostion:  After consideration of the diagnostic results and the patients response to treatment, I feel that the patent would benefit from ***.      {Click here for ABCD2, HEART and other calculators REFRESH Note before signing:1}                              Medical Decision Making  ***  {Document critical care time when appropriate  Document review of labs and clinical decision tools ie CHADS2VASC2, etc  Document your independent review of radiology images and any outside records  Document your discussion with family members, caretakers and with consultants  Document social determinants of health affecting pt's care  Document your decision making why or why not admission, treatments were needed:32947:::1}   Final diagnoses:  None    ED Discharge Orders     None

## 2024-11-03 NOTE — ED Triage Notes (Signed)
 L ear pain x4 days. Pt states that she can't hear out of that ear.

## 2024-11-03 NOTE — Discharge Instructions (Addendum)
 There were no acute findings to explain your hearing loss today.  Previous infection of the ear appears to have fully resolved.  I would recommend following up with your ENT in the nonemergent setting for further evaluation of hearing loss.

## 2024-11-06 ENCOUNTER — Other Ambulatory Visit (INDEPENDENT_AMBULATORY_CARE_PROVIDER_SITE_OTHER): Payer: Self-pay

## 2024-11-06 DIAGNOSIS — Z720 Tobacco use: Secondary | ICD-10-CM

## 2024-11-06 MED ORDER — VARENICLINE TARTRATE (STARTER) 0.5 MG X 11 & 1 MG X 42 PO TBPK: ORAL_TABLET | ORAL | 0 refills | Status: DC

## 2024-11-06 NOTE — Progress Notes (Signed)
 11/06/2024 Name: KAIMANA NEUZIL MRN: [id number] DOB: [birth year]  Chief Complaint  Patient presents with   Diabetes   Hyperlipidemia   Nicotine Dependence    TERRAN HOLLENKAMP is a 48 y.o. year old female who presented for a telephone visit.   They were referred to the pharmacist by their PCP for assistance in managing diabetes and medication access.  PMH includes sarcoidosis of lung, asthma, T2DM, TMJ, BMI > 30, depression, tobacco use, HLD   Subjective: Patient was seen by PCP, Bascom Borer, on 07/20/24. At last visit, patient's A1C had improved from 9.5% to 7.7%. She reported that she had not been taking her rosuvastatin  or metformin . At pharmacy telephone appt on 08/06/24, patient reported doing ok. She was hesitant to discuss her medications with me. She reported she had metformin  at home, but had not started taking it. She reported the only medication she was taking was Humira. She was agreeable to a trial of metformin  XR 500 mg BID. She preferred to recheck lipid panel before initiating statin therapy. At pharmacy call on 09/19/24, patient reported intermittent adherence to metformin . She saw PCP on 10/22/24. BP was controlled. A1C improved to 7.3%.   Today, patient reports doing ok. She is taking metformin  500 mg once daily. She is still smoking ~3 black and milds/day but has a strong desire to quit. She reports her breathing (sarcoidosis) has gotten worse with the changing weather. She did switch to Advair as advised by her pulmonologist.    Care Team: Primary Care Provider: Borer Bascom RAMAN, NP ; Next Scheduled Visit: need to schedule ~ 01/23/24   Medication Access/Adherence  Current Pharmacy:  [facility] [address] ([address]), [place] - [address] [place] ([place]) [place] [address] Phone: [phone] Fax: [phone]  [facility] - [place], [name] - [address] [place] [name] [address] Phone: [phone] Fax:  [phone]   Patient reports affordability concerns with their medications: No  - test claim for Farxiga $0. Appears she has [facility] Dual Complete  Patient reports access/transportation concerns to their pharmacy: No   Patient reports adherence concerns with their medications:  Yes  - she does not like taking oral medications. Felt that she had fatigue and dry mouth when taking all her medications.    Diabetes:  Current medications: metformin  XR 500 mg BID (reports she is taking 500 mg daily) Medications tried in the past: N/A  Current glucose readings: Using Accu Chek Guide meter. Reports she has not been checking regularly because her hand has been hurting. No BG to review today.   Patient denies hypoglycemic s/sx including dizziness, shakiness, sweating. Patient denies hyperglycemic symptoms including polyuria, polydipsia, polyphagia, nocturia, neuropathy.Reports occasional blurred vision when she wakes up in the AM.  Current meal patterns: Reports her mother helps her make healthy food choices. Reports she is still prioritizing vegetables. Endorses protein intake included breaded fish (not a lot of other meat). Does report occasional sweets including mini candy bar like three musketeers. Drinks: Reports that she is drinking water  throughout the day. She has been having sweet tea. She cut out juice.  Snacks: sometimes has meal replacement drink   Current physical activity: Walks her dog regularly. Patient reports she has lost a lot of weight with healthier lifestyle. However, breathing has been worse recently with colder weather and it is keeping her from being as physically active.   Current medication access support: none  Hyperlipidemia/ASCVD Risk Reduction  Current lipid lowering medications: patient declined statin at last visit  Antiplatelet regimen: none  ASCVD History: cMRI in March 2024 - minimal < 25% soft plaque in distal RCA. No plaque otherwise. CAC 0. Family History:  no cardiac fam hx listed in chart Risk Factors: T2DM  Clinical ASCVD: No  The 10-year ASCVD risk score (Arnett DK, et al., 2019) is: 10.1%   Values used to calculate the score:     Age: 61 years     Clincally relevant sex: Female     Is Non-Hispanic African American: Yes     Diabetic: Yes     Tobacco smoker: Yes     Systolic Blood Pressure: 140 mmHg     Is BP treated: No     HDL Cholesterol: 55 mg/dL     Total Cholesterol: 218 mg/dL    Objective:  BP Readings from Last 3 Encounters:  11/03/24 (!) 140/97  10/22/24 122/66  07/20/24 111/74    Lab Results  Component Value Date   HGBA1C 7.3 (A) 10/22/2024   HGBA1C 7.7 (A) 07/20/2024   HGBA1C 9.5 (H) 03/22/2024       Latest Ref Rng & Units 03/13/2024    2:58 PM 03/06/2024    7:48 PM 12/12/2023    7:15 PM  BMP  Glucose 70 - 99 mg/dL 834  811  813   BUN 6 - 20 mg/dL 9  7  10    Creatinine 0.44 - 1.00 mg/dL 9.31  9.29  9.45   Sodium 135 - 145 mmol/L 138  137  136   Potassium 3.5 - 5.1 mmol/L 3.6  3.5  3.8   Chloride 98 - 111 mmol/L 106  103  108   CO2 22 - 32 mmol/L 17  21  18    Calcium  8.9 - 10.3 mg/dL 9.6  89.9  9.3     Lab Results  Component Value Date   CHOL 218 (H) 01/26/2024   HDL 55 01/26/2024   LDLCALC 139 (H) 01/26/2024   TRIG 136 01/26/2024   CHOLHDL 4.0 01/26/2024    Medications Reviewed Today     Reviewed by Brinda Lorain SQUIBB, RPH (Pharmacist) on 11/06/24 at 1445  Med List Status: <None>   Medication Order Taking? Sig Documenting Provider Last Dose Status Informant  Adalimumab 40 MG/0.8ML PNKT [id number] Yes Inject 40 mg into the skin every 14 (fourteen) days. [provider]  Active Self  Blood Glucose Monitoring Suppl DEVI [id number]  1 each by Does not apply route 2 (two) times daily. May substitute to any manufacturer covered by patient's insurance. Paseda, Folashade R, FNP  Active Self  clobetasol  ointment (TEMOVATE ) 0.05 % [id number]  Apply 1 Application topically 2 (two) times daily. Apply to  affected areas twice daily then stop. Alternate with Pimecrolimus  cream Alm Delon SAILOR, DO  Active Self    Discontinued 11/06/24 1411 (Change in therapy)   Discontinued 12/22/19 1554   fluticasone -salmeterol (ADVAIR) 500-50 MCG/ACT AEPB [id number] Yes Inhale 1 puff into the lungs in the morning and at bedtime. [provider]  Active   Glucose Blood (BLOOD GLUCOSE TEST STRIPS) STRP [id number]  1 each by In Vitro route 2 (two) times daily. May substitute to any manufacturer covered by patient's insurance. Paseda, Folashade R, FNP  Active Self  metFORMIN  (GLUCOPHAGE -XR) 500 MG 24 hr tablet [id number] Yes Take 1 tablet (500 mg total) by mouth 2 (two) times daily with a meal.  Patient taking differently: Take 1 tablet (500 mg total) by mouth  2 (two) times daily with a meal.   Oley Bascom RAMAN, NP  Active   oxyCODONE  (ROXICODONE ) 15 MG immediate release tablet [id number]  Take 15 mg by mouth 4 (four) times daily. [provider]  Active Self  pimecrolimus  (ELIDEL ) 1 % cream [id number]  APPLY   TOPICALLY TWICE DAILY Alm Delon SAILOR, DO  Active   testosterone  cypionate (DEPOTESTOSTERONE CYPIONATE) injection 150 mg [id number]   Nichols, Tonya S, NP  Active   Varenicline Tartrate, Starter, 0.5 MG X 11 & 1 MG X 42 TBPK [id number] Yes Take 0.5 mg daily for 3 days, then 0.5 mg twice daily for 4 days, then 1 mg twice daily thereafter as directed in the pack. Take with food. Oley Bascom RAMAN, NP  Active   Vitamin D , Ergocalciferol , (DRISDOL ) 1.25 MG (50000 UNIT) CAPS capsule [id number]  Take 1 capsule (50,000 Units total) by mouth every 7 (seven) days.  Patient not taking: Reported on 10/22/2024   Oley Bascom RAMAN, NP  Active Self              Assessment/Plan:   Diabetes: - Currently uncontrolled with most recent A1C of 7.3% above goal <7%, but improved from 9.7% in March 2025 with lifestyle changes. Encouraged increasing metformin  to TDD of 1000 mg/d as prescribed and switching sweet tea  for a diet or sugar free option. In the future, could discuss transitioning to once weekly GLP-RA if she would prefer this for adherence - BMI > 30.  - Last UACR March 2025: 127 mg/g - patient is a good candidate for an SGLT2i, but hesitant to increase pill burden at this time.  - Reviewed long term cardiovascular and renal outcomes of uncontrolled blood sugar - Reviewed goal A1c, goal fasting, and goal 2 hour post prandial glucose - Reviewed dietary modifications including  utilizing the healthy plate method, limiting portion size of carbohydrate foods, increasing intake of protein and non-starchy vegetables. Counseled patient to stay hydrated with water  throughout the day. Discussed replacing sweet tea with zero sugar option.  - Reviewed lifestyle modifications including: aiming for 150 minutes of moderate intensity exercise every week.  - Recommend to increase metformin  XR to 1000 mg daily - Recommend to check glucose twice daily: fasting and 2-hr PPG . Counseled patient to bring glucometer or BG log to every appointment. - Next A1C due 01/23/24   Hyperlipidemia/ASCVD Risk Reduction: - Currently uncontrolled with most recent LDL-C of 139 mg/dL above goal < 70 mg/dL given U7IF + comorbidities. No significant plaque or calcium  on cMRI in March 2024. Moderate intensity statin indicated - however, patient does not want to start taking at this time. Will plan to recheck lipid panel at next PCP appt and re-discuss statin at that time. Discussed modifiable risk factors for ASCVD prevention like smoking cessation today. Patient is interested in starting varenicline.  - Reviewed long term complications of uncontrolled cholesterol - Reviewed lifestyle recommendations to lower LDL-C including regular physical activity, 5-10% weight loss, eating a diet low in saturated fat, and increasing intake of fiber to at least 25 g per day. - Recommend starting varenicline 0.5 mg daily for 3 days, then 0.5 mg twice daily  for 4 days, then 1 mg twice daily thereafter. Counseled to take with food to reduce risk of GI adverse effects. Also counseled to stop therapy and contact provider if change in mood.  - Provided patient with information for [place] [employer]uit[facility] to request free NRT therapy - Recommend to recheck lipid panel at next  PCP appt  Patient verbalized understanding of treatment plan.    Follow Up Plan:  Pharmacist telephone 12/07/24 PCP clinic visit needs to be scheduled 01/23/24   Lorain Baseman, PharmD [facility] [phone]

## 2024-11-14 ENCOUNTER — Ambulatory Visit (INDEPENDENT_AMBULATORY_CARE_PROVIDER_SITE_OTHER): Admitting: Otolaryngology

## 2024-11-26 ENCOUNTER — Ambulatory Visit (INDEPENDENT_AMBULATORY_CARE_PROVIDER_SITE_OTHER): Payer: Self-pay | Admitting: Nurse Practitioner

## 2024-11-26 DIAGNOSIS — Z309 Encounter for contraceptive management, unspecified: Secondary | ICD-10-CM | POA: Diagnosis not present

## 2024-11-26 LAB — POCT URINE PREGNANCY: Preg Test, Ur: NEGATIVE

## 2024-11-26 MED ORDER — MEDROXYPROGESTERONE ACETATE 150 MG/ML IM SUSY
150.0000 mg | PREFILLED_SYRINGE | INTRAMUSCULAR | Status: AC
  Administered 2024-11-26: 150 mg via INTRAMUSCULAR

## 2024-11-26 MED ORDER — METHYLPREDNISOLONE ACETATE 80 MG/ML IJ SUSP: 80.0000 mg | Freq: Once | INTRAMUSCULAR | Status: DC

## 2024-11-26 NOTE — Progress Notes (Signed)
 Patient urine pregnancy test was negative, patient was administered depo injection. Patient tolerated well, will get next injection in 3 months.

## 2024-12-07 ENCOUNTER — Other Ambulatory Visit: Payer: Self-pay

## 2024-12-07 DIAGNOSIS — Z720 Tobacco use: Secondary | ICD-10-CM

## 2024-12-07 DIAGNOSIS — E1165 Type 2 diabetes mellitus with hyperglycemia: Secondary | ICD-10-CM

## 2024-12-07 NOTE — Progress Notes (Signed)
 12/07/2024 Name: Rhonda Hester MRN: [id number] DOB: 1976-09-24  Chief Complaint  Patient presents with   Diabetes   Nicotine Dependence    Rhonda Hester is a 48 y.o. year old female who presented for a telephone visit.   They were referred to the pharmacist by their PCP for assistance in managing diabetes and medication access.  PMH includes sarcoidosis of lung, asthma, T2DM, TMJ, BMI > 30, depression, tobacco use, HLD   Subjective: Patient was seen by PCP, Rhonda Hester, on 07/20/24. At last visit, patient's A1C had improved from 9.5% to 7.7%. She reported that she had not been taking her rosuvastatin  or metformin . At pharmacy telephone appt on 08/06/24, patient reported doing ok. She was hesitant to discuss her medications with me. She reported she had metformin  at home, but had not started taking it. She reported the only medication she was taking was Humira. She was agreeable to a trial of metformin  XR 500 mg BID. She preferred to recheck lipid panel before initiating statin therapy. At pharmacy call on 09/19/24, patient reported intermittent adherence to metformin . She saw PCP on 10/22/24. BP was controlled. A1C improved to 7.3%. At pharmacy call on 11/06/24, patient was agreeable to try varenicline  for smoking cessation and take metformin  1000 mg (2 tablets) daily.   Today, patient reports doing ok. She had just woken up and requested to keep appt short. She reports she tried Chantix  (varenicline  for 2 week) and discontinued due to vivid dreams. Not willing to restart. Did not call Rhonda Hester (had not read MyChart message). She has decreased smoking from 3 to 2 black and milds per day.   Care Team: Primary Care Provider: Borer Rhonda RAMAN, NP ; Next Scheduled Visit: need to schedule ~ 01/23/24   Medication Access/Adherence  Current Pharmacy:  [facility] [address] ([address]), [place] - [address] [place] ([place]) [place] [address] Phone:  [phone] Fax: [phone]  [facility] - [place], [name] - [address] [place] [address] Phone: [phone] Fax: [phone]   Patient reports affordability concerns with their medications: No  - test claim for Farxiga $0. Appears she has [facility] Dual Complete  Patient reports access/transportation concerns to their pharmacy: No   Patient reports adherence concerns with their medications:  Yes  - she does not like taking oral medications. Felt that she had fatigue and dry mouth when taking all her medications.    Diabetes:  Current medications: metformin  XR 1000 mg daily (reports she takes 2 tablets at a time when she remembers) Medications tried in the past: N/A  Current glucose readings: Has Accu Chek Guide meter. Reports she has not been checking regularly because her hands hurt. No BG to review.  Patient denies hypoglycemic s/sx including dizziness, shakiness, sweating. Patient denies hyperglycemic symptoms including polyuria, polydipsia, polyphagia, nocturia, neuropathy. Reports occasional blurred vision when she wakes up in the AM.  Current meal patterns: Previously reported her mother helps her make healthy food choices. Reported she is still prioritizing vegetables. Endorsed protein intake included breaded fish (not a lot of other meat). Reported occasional sweets including mini candy bar like three musketeers. Drinks: Reports that she is drinking water  throughout the day. She has been having sweet tea. She cut out juice.  Snacks: sometimes has meal replacement drink   Current physical activity: Walks her dog regularly. Patient reports she has lost a lot of weight with healthier lifestyle. However, breathing has  been worse recently with colder weather and it is keeping her from being as physically active.   Current medication access support: none  Hyperlipidemia/ASCVD Risk Reduction  Current lipid lowering medications: patient declined  statin at previous visits  Antiplatelet regimen: none  ASCVD History: cMRI in March 2024 - minimal < 25% soft plaque in distal RCA. No plaque otherwise. CAC 0. Family History: no cardiac fam hx listed in chart Risk Factors: T2DM  Clinical ASCVD: No  The 10-year ASCVD risk score (Arnett DK, et al., 2019) is: 11.1%   Values used to calculate the score:     Age: 89 years     Clinically relevant sex: Female     Is Non-Hispanic African American: Yes     Diabetic: Yes     Tobacco smoker: Yes     Systolic Blood Pressure: 140 mmHg     Is BP treated: No     HDL Cholesterol: 55 mg/dL     Total Cholesterol: 218 mg/dL    Objective:  BP Readings from Last 3 Encounters:  11/03/24 (!) 140/97  10/22/24 122/66  07/20/24 111/74    Lab Results  Component Value Date   HGBA1C 7.3 (A) 10/22/2024   HGBA1C 7.7 (A) 07/20/2024   HGBA1C 9.5 (H) 03/22/2024       Latest Ref Rng & Units 03/13/2024    2:58 PM 03/06/2024    7:48 PM 12/12/2023    7:15 PM  BMP  Glucose 70 - 99 mg/dL 834  811  813   BUN 6 - 20 mg/dL 9  7  10    Creatinine 0.44 - 1.00 mg/dL 9.31  9.29  9.45   Sodium 135 - 145 mmol/L 138  137  136   Potassium 3.5 - 5.1 mmol/L 3.6  3.5  3.8   Chloride 98 - 111 mmol/L 106  103  108   CO2 22 - 32 mmol/L 17  21  18    Calcium  8.9 - 10.3 mg/dL 9.6  89.9  9.3     Lab Results  Component Value Date   CHOL 218 (H) 01/26/2024   HDL 55 01/26/2024   LDLCALC 139 (H) 01/26/2024   TRIG 136 01/26/2024   CHOLHDL 4.0 01/26/2024    Medications Reviewed Today     Reviewed by Rhonda Hester, RPH-CPP (Pharmacist) on 12/07/24 at 1245  Med List Status: <None>   Medication Order Taking? Sig Documenting Provider Last Dose Status Informant  Adalimumab 40 MG/0.8ML PNKT [id number]  Inject 40 mg into the skin every 14 (fourteen) days. [provider]  Active Self  Blood Glucose Monitoring Suppl DEVI [id number]  1 each by Does not apply route 2 (two) times daily. May substitute to any manufacturer  covered by patient's insurance. Paseda, Folashade R, FNP  Active Self  clobetasol  ointment (TEMOVATE ) 0.05 % [id number]  Apply 1 Application topically 2 (two) times daily. Apply to affected areas twice daily then stop. Alternate with Pimecrolimus  cream Alm Delon SAILOR, DO  Active Self    Discontinued 12/22/19 1554   fluticasone -salmeterol (ADVAIR) 500-50 MCG/ACT AEPB [id number]  Inhale 1 puff into the lungs in the morning and at bedtime. [provider]  Active   Glucose Blood (BLOOD GLUCOSE TEST STRIPS) STRP [id number]  1 each by In Vitro route 2 (two) times daily. May substitute to any manufacturer covered by patient's insurance. Paseda, Folashade R, FNP  Active Self  medroxyPROGESTERone  Acetate SUSY 150 mg [id number]   Nichols, Tonya S, NP  Active  metFORMIN  (GLUCOPHAGE -XR) 500 MG 24 hr tablet [id number] Yes Take 1 tablet (500 mg total) by mouth 2 (two) times daily with a meal. Oley Rhonda RAMAN, NP  Active   oxyCODONE  (ROXICODONE ) 15 MG immediate release tablet [id number]  Take 15 mg by mouth 4 (four) times daily. [provider]  Active Self  pimecrolimus  (ELIDEL ) 1 % cream [id number]  APPLY   TOPICALLY TWICE DAILY Alm Delon SAILOR, DO  Active   testosterone  cypionate (DEPOTESTOSTERONE CYPIONATE) injection 150 mg [id number]   Nichols, Tonya S, NP  Active     Discontinued 12/07/24 1245 (Side effect (s))   Vitamin D , Ergocalciferol , (DRISDOL ) 1.25 MG (50000 UNIT) CAPS capsule [id number]  Take 1 capsule (50,000 Units total) by mouth every 7 (seven) days.  Patient not taking: Reported on 10/22/2024   Oley Rhonda RAMAN, NP  Active Self              Assessment/Plan:   Diabetes: - Currently uncontrolled with most recent A1C of 7.3% above goal <7%, but improved from 9.7% in March 2025 with lifestyle changes. Encouraged continued adherence to metformin  to TDD of 1000 mg/d as prescribed and switching sweet tea for a diet or sugar free option. In the future, could discuss transitioning  to once weekly GLP-RA if she would prefer this for adherence - BMI > 30.  - Last UACR March 2025: 127 mg/g - patient is a good candidate for an SGLT2i, but hesitant to increase pill burden at this time.  - Reviewed long term cardiovascular and renal outcomes of uncontrolled blood sugar - Reviewed goal A1c, goal fasting, and goal 2 hour post prandial glucose - Reviewed dietary modifications including  utilizing the healthy plate method, limiting portion size of carbohydrate foods, increasing intake of protein and non-starchy vegetables. Counseled patient to stay hydrated with water  throughout the day. Discussed replacing sweet tea with zero sugar option.  - Reviewed lifestyle modifications including: aiming for 150 minutes of moderate intensity exercise every week.  - Recommend to continue metformin  XR to 1000 mg daily - Recommend to check glucose twice daily: fasting and 2-hr PPG . Counseled patient to bring glucometer or BG log to every appointment. - Next A1C due 01/23/24   Hyperlipidemia/ASCVD Risk Reduction: - Currently uncontrolled with most recent LDL-C of 139 mg/dL above goal < 70 mg/dL given U7IF + comorbidities. No significant plaque or calcium  on cMRI in March 2024. Moderate intensity statin indicated - however, patient does not want to start taking at this time. Will plan to recheck lipid panel at next PCP appt and re-discuss statin at that time. Discussed modifiable risk factors for ASCVD prevention like smoking cessation today. Patient did not tolerate varenicline  but would be willing to try NRT. - Reviewed long term complications of uncontrolled cholesterol - Reviewed lifestyle recommendations to lower LDL-C including regular physical activity, 5-10% weight loss, eating a diet low in saturated fat, and increasing intake of fiber to at least 25 g per day. - Provided patient with information for [facility] to request free NRT therapy - Recommend to recheck lipid panel at next PCP  appt  Patient verbalized understanding of treatment plan.    Follow Up Plan:  Pharmacist telephone 02/05/25 PCP clinic visit scheduled today for 01/31/25   Lorain Baseman, PharmD [facility] [phone]

## 2025-01-28 ENCOUNTER — Ambulatory Visit: Payer: Self-pay | Admitting: Nurse Practitioner

## 2025-01-31 ENCOUNTER — Ambulatory Visit (INDEPENDENT_AMBULATORY_CARE_PROVIDER_SITE_OTHER): Payer: Self-pay | Admitting: Nurse Practitioner

## 2025-01-31 ENCOUNTER — Encounter: Payer: Self-pay | Admitting: Nurse Practitioner

## 2025-01-31 VITALS — BP 143/83 | HR 79 | Wt 217.0 lb

## 2025-01-31 DIAGNOSIS — E559 Vitamin D deficiency, unspecified: Secondary | ICD-10-CM | POA: Diagnosis not present

## 2025-01-31 DIAGNOSIS — Z13220 Encounter for screening for lipoid disorders: Secondary | ICD-10-CM

## 2025-01-31 DIAGNOSIS — E1165 Type 2 diabetes mellitus with hyperglycemia: Secondary | ICD-10-CM

## 2025-01-31 DIAGNOSIS — R202 Paresthesia of skin: Secondary | ICD-10-CM

## 2025-01-31 DIAGNOSIS — R21 Rash and other nonspecific skin eruption: Secondary | ICD-10-CM | POA: Diagnosis not present

## 2025-01-31 DIAGNOSIS — R200 Anesthesia of skin: Secondary | ICD-10-CM

## 2025-01-31 DIAGNOSIS — K429 Umbilical hernia without obstruction or gangrene: Secondary | ICD-10-CM

## 2025-01-31 LAB — POCT GLYCOSYLATED HEMOGLOBIN (HGB A1C): Hemoglobin A1C: 7 % — AB (ref 4.0–5.6)

## 2025-01-31 MED ORDER — NYSTATIN 100000 UNIT/GM EX CREA: 1.0000 | TOPICAL_CREAM | Freq: Two times a day (BID) | CUTANEOUS | 0 refills | Status: AC

## 2025-01-31 MED ORDER — ASPIRIN-ACETAMINOPHEN-CAFFEINE 250-250-65 MG PO TABS: 2.0000 | ORAL_TABLET | Freq: Four times a day (QID) | ORAL | 0 refills | Status: AC | PRN

## 2025-01-31 NOTE — Progress Notes (Signed)
 "  Subjective   Patient ID: Rhonda Hester, female    DOB: Jul 04, 1976, 49 y.o.   MRN: [id number]  Chief Complaint  Patient presents with   Medical Management of Chronic Issues   Diabetes    Referring provider: Oley Bascom RAMAN, NP  Rhonda Hester is a 49 y.o. female with Past Medical History: No date: Allergic asthma No date: Allergy      Comment:  SEASONAL No date: Anxiety No date: Arthritis     Comment:  in shoulder 05/2017: Breast mass     Comment:  lt breast lump No date: Complication of anesthesia     Comment:  woke up during D&C No date: Diabetes mellitus without complication (HCC) No date: Eczema No date: Fibroid No date: GERD (gastroesophageal reflux disease) No date: Heart murmur     Comment:  as child per pt No date: History of anal fissures No date: History of Bell's palsy     Comment:  2006  RIGHT SIDE--  RESOLVED No date: History of gastroesophageal reflux (GERD) No date: History of kidney stones No date: Hyperlipidemia No date: Interstitial cystitis No date: Psoriasis No date: Sarcoidosis of lung     Comment:  DX 2006--  PULMOLOGIST--  DR TZMU- on 2 liters of               oxygen , goes to pain clinic No date: Seasonal allergic rhinitis No date: Shortness of breath No date: Vaginal Pap smear, abnormal  HPI: HPI  Allergies[1]  Immunization History  Administered Date(s) Administered   Dtap, Unspecified 03/25/1977, 06/15/1977, 12/02/1977, 04/05/1979, 05/14/1981   HPV 9-valent 11/15/2019   Influenza Whole 09/02/2011, 09/26/2012   Influenza,inj,Quad PF,6+ Mos 12/27/2012, 09/04/2015, 09/20/2017, 08/10/2018, 08/27/2019, 11/20/2019   Influenza-Unspecified 12/27/2012, 09/04/2015, 09/20/2017, 08/10/2018   Measles 04/29/1978   Mumps 04/29/1978   Pneumococcal Conjugate-13 11/14/2018   Pneumococcal Polysaccharide-23 05/10/2016   Polio, Unspecified 03/25/1977, 06/15/1977, 12/02/1977, 04/29/1978, 04/05/1979, 05/14/1981   Rubella 04/29/1978   Tdap  05/10/2016    Tobacco History: Tobacco Use History[2] Ready to quit: Not Answered Counseling given: Not Answered Tobacco comments: Smokes 1 black and mild occasionally   Outpatient Encounter Medications as of 01/31/2025  Medication Sig   Adalimumab 40 MG/0.8ML PNKT Inject 40 mg into the skin every 14 (fourteen) days.   fluticasone -salmeterol (ADVAIR) 500-50 MCG/ACT AEPB Inhale 1 puff into the lungs in the morning and at bedtime.   oxyCODONE  (ROXICODONE ) 15 MG immediate release tablet Take 15 mg by mouth 4 (four) times daily.   pimecrolimus  (ELIDEL ) 1 % cream APPLY   TOPICALLY TWICE DAILY   Blood Glucose Monitoring Suppl DEVI 1 each by Does not apply route 2 (two) times daily. May substitute to any manufacturer covered by patient's insurance. (Patient not taking: Reported on 01/31/2025)   clobetasol  ointment (TEMOVATE ) 0.05 % Apply 1 Application topically 2 (two) times daily. Apply to affected areas twice daily then stop. Alternate with Pimecrolimus  cream (Patient not taking: Reported on 01/31/2025)   Glucose Blood (BLOOD GLUCOSE TEST STRIPS) STRP 1 each by In Vitro route 2 (two) times daily. May substitute to any manufacturer covered by patient's insurance. (Patient not taking: Reported on 01/31/2025)   metFORMIN  (GLUCOPHAGE -XR) 500 MG 24 hr tablet Take 1 tablet (500 mg total) by mouth 2 (two) times daily with a meal. (Patient not taking: Reported on 01/31/2025)   Vitamin D , Ergocalciferol , (DRISDOL ) 1.25 MG (50000 UNIT) CAPS capsule Take 1 capsule (50,000 Units total) by mouth every 7 (seven) days. (Patient not taking: Reported  on 01/31/2025)   [DISCONTINUED] famotidine  (PEPCID ) 20 MG tablet Take 1 tablet (20 mg total) by mouth 2 (two) times daily.   Facility-Administered Encounter Medications as of 01/31/2025  Medication   medroxyPROGESTERone  Acetate SUSY 150 mg   testosterone  cypionate (DEPOTESTOSTERONE CYPIONATE) injection 150 mg    Review of Systems  Review of Systems   Objective:   BP  (!) 143/83   Pulse 79   Wt 217 lb (98.4 kg)   SpO2 98%   BMI 33.99 kg/m   Wt Readings from Last 5 Encounters:  01/31/25 217 lb (98.4 kg)  11/03/24 225 lb (102.1 kg)  07/20/24 216 lb 12.8 oz (98.3 kg)  07/04/24 214 lb (97.1 kg)  04/20/24 214 lb (97.1 kg)     Physical Exam    Assessment & Plan:   Type 2 diabetes mellitus with hyperglycemia, without long-term current use of insulin  (HCC)     No follow-ups on file.   Suzen Shove, RMA 01/31/2025     [1]  Allergies Allergen Reactions   Acyclovir  And Related Other (See Comments)    Patient states it made me feel like I can't breath    Bee Pollen Itching and Cough     coughing   Citric Acid Other (See Comments)    AVOIDS DUE TO INTERSTITIAL CYSTITIS   Dust Mite Extract Itching and Cough     coughing   Grapeseed Extract [Nutritional Supplements] Itching    Throat itching and burning   Pineapple Itching    Burning in mouth and tingling lips   Pollen Extract Itching and Other (See Comments)     coughing   Nickel Rash  [2]  Social History Tobacco Use  Smoking Status Every Day   Types: Cigars  Smokeless Tobacco Never  Tobacco Comments   Smokes 1 black and mild occasionally   "

## 2025-01-31 NOTE — Progress Notes (Signed)
 "  Subjective   Patient ID: Rhonda Hester, female    DOB: 09-23-1976, 49 y.o.   MRN: [id number]  Chief Complaint  Patient presents with   Medical Management of Chronic Issues   Diabetes    Referring provider: Oley Bascom RAMAN, NP  Rhonda Hester is a 49 y.o. female with Past Medical History: No date: Allergic asthma No date: Allergy      Comment:  SEASONAL No date: Anxiety No date: Arthritis     Comment:  in shoulder 05/2017: Breast mass     Comment:  lt breast lump No date: Complication of anesthesia     Comment:  woke up during D&C No date: Diabetes mellitus without complication (HCC) No date: Eczema No date: Fibroid No date: GERD (gastroesophageal reflux disease) No date: Heart murmur     Comment:  as child per pt No date: History of anal fissures No date: History of Bell's palsy     Comment:  2006  RIGHT SIDE--  RESOLVED No date: History of gastroesophageal reflux (GERD) No date: History of kidney stones No date: Hyperlipidemia No date: Interstitial cystitis No date: Psoriasis No date: Sarcoidosis of lung     Comment:  DX 2006--  PULMOLOGIST--  DR TZMU- on 2 liters of               oxygen , goes to pain clinic No date: Seasonal allergic rhinitis No date: Shortness of breath No date: Vaginal Pap smear, abnormal   HPI  Diabetes:  Patient presents today for follow-up on diabetes.  She has started walking routine. A1C is 7.0. non compliant with medications. Has not been taking crestor .  Stopped taking metformin , but will restart it. Pharmacy is following for diabetic medication management and hyperlipidemia.  Denies f/c/s, n/v/d, hemoptysis, PND, leg swelling. Denies chest pain or edema.   Umbilical pain:  Patient complains of umbilical pain and protrusion above the umbilical area.  Will place referral to [facility] surgery for evaluation of umbilical hernia.  Right hand numbness:  Patient complains today of right hand numbness especially in the  mornings.  She does have trouble opening closing her hand first thing in the morning.  She states that this has been going on for several weeks now.  We will place a referral to neurology to evaluate for carpal tunnel.  Rash:  Patient does complain of rash to underneath of bilateral breast.  We discussed that most likely this is a yeast type rash.  It we will order nystatin  cream.  Headaches:  Patient does complain of frequent headaches.  We will order Excedrin  Migraine for patient to take as needed.  If symptoms worsen she needs to let us  know.  Allergies[1]  Immunization History  Administered Date(s) Administered   Dtap, Unspecified 03/25/1977, 06/15/1977, 12/02/1977, 04/05/1979, 05/14/1981   HPV 9-valent 11/15/2019   Influenza Whole 09/02/2011, 09/26/2012   Influenza,inj,Quad PF,6+ Mos 12/27/2012, 09/04/2015, 09/20/2017, 08/10/2018, 08/27/2019, 11/20/2019   Influenza-Unspecified 12/27/2012, 09/04/2015, 09/20/2017, 08/10/2018   Measles 04/29/1978   Mumps 04/29/1978   Pneumococcal Conjugate-13 11/14/2018   Pneumococcal Polysaccharide-23 05/10/2016   Polio, Unspecified 03/25/1977, 06/15/1977, 12/02/1977, 04/29/1978, 04/05/1979, 05/14/1981   Rubella 04/29/1978   Tdap 05/10/2016    Tobacco History: Tobacco Use History[2] Ready to quit: Not Answered Counseling given: Not Answered Tobacco comments: Smokes 1 black and mild occasionally   Outpatient Encounter Medications as of 01/31/2025  Medication Sig   Adalimumab 40 MG/0.8ML PNKT Inject 40 mg into the skin every 14 (fourteen) days.  aspirin -acetaminophen -caffeine  (EXCEDRIN  MIGRAINE) 250-250-65 MG tablet Take 2 tablets by mouth every 6 (six) hours as needed for headache.   fluticasone -salmeterol (ADVAIR) 500-50 MCG/ACT AEPB Inhale 1 puff into the lungs in the morning and at bedtime.   nystatin  cream (MYCOSTATIN ) Apply 1 Application topically 2 (two) times daily.   oxyCODONE  (ROXICODONE ) 15 MG immediate release tablet Take 15 mg  by mouth 4 (four) times daily.   pimecrolimus  (ELIDEL ) 1 % cream APPLY   TOPICALLY TWICE DAILY   Blood Glucose Monitoring Suppl DEVI 1 each by Does not apply route 2 (two) times daily. May substitute to any manufacturer covered by patient's insurance. (Patient not taking: Reported on 01/31/2025)   clobetasol  ointment (TEMOVATE ) 0.05 % Apply 1 Application topically 2 (two) times daily. Apply to affected areas twice daily then stop. Alternate with Pimecrolimus  cream (Patient not taking: Reported on 01/31/2025)   Glucose Blood (BLOOD GLUCOSE TEST STRIPS) STRP 1 each by In Vitro route 2 (two) times daily. May substitute to any manufacturer covered by patient's insurance. (Patient not taking: Reported on 01/31/2025)   metFORMIN  (GLUCOPHAGE -XR) 500 MG 24 hr tablet Take 1 tablet (500 mg total) by mouth 2 (two) times daily with a meal. (Patient not taking: Reported on 01/31/2025)   Vitamin D , Ergocalciferol , (DRISDOL ) 1.25 MG (50000 UNIT) CAPS capsule Take 1 capsule (50,000 Units total) by mouth every 7 (seven) days. (Patient not taking: Reported on 01/31/2025)   [DISCONTINUED] famotidine  (PEPCID ) 20 MG tablet Take 1 tablet (20 mg total) by mouth 2 (two) times daily.   Facility-Administered Encounter Medications as of 01/31/2025  Medication   medroxyPROGESTERone  Acetate SUSY 150 mg   testosterone  cypionate (DEPOTESTOSTERONE CYPIONATE) injection 150 mg    Review of Systems  Review of Systems  Constitutional: Negative.   HENT: Negative.    Cardiovascular: Negative.   Gastrointestinal: Negative.   Musculoskeletal:        Right hand numbness and pain  Skin:  Positive for rash.  Allergic/Immunologic: Negative.   Neurological:  Positive for headaches.  Psychiatric/Behavioral: Negative.       Objective:   BP (!) 143/83   Pulse 79   Wt 217 lb (98.4 kg)   SpO2 98%   BMI 33.99 kg/m   Wt Readings from Last 5 Encounters:  01/31/25 217 lb (98.4 kg)  11/03/24 225 lb (102.1 kg)  07/20/24 216 lb 12.8 oz (98.3  kg)  07/04/24 214 lb (97.1 kg)  04/20/24 214 lb (97.1 kg)     Physical Exam Vitals and nursing note reviewed.  Constitutional:      General: She is not in acute distress.    Appearance: She is well-developed.  Cardiovascular:     Rate and Rhythm: Normal rate and regular rhythm.  Pulmonary:     Effort: Pulmonary effort is normal.     Breath sounds: Normal breath sounds.  Chest:       Comments: Rash under bilateral breasts Neurological:     Mental Status: She is alert and oriented to person, place, and time.       Assessment & Plan:   Type 2 diabetes mellitus with hyperglycemia, without long-term current use of insulin  (HCC) -     POCT glycosylated hemoglobin (Hb A1C) -     CBC -     Comprehensive metabolic panel with GFR  Numbness and tingling in right hand -     Ambulatory referral to Neurology  Lipid screening -     Lipid panel  Umbilical hernia without obstruction and  without gangrene -     Ambulatory referral to General Surgery  Vitamin D  deficiency -     VITAMIN D  25 Hydroxy (Vit-D Deficiency, Fractures)  Rash -     Nystatin ; Apply 1 Application topically 2 (two) times daily.  Dispense: 30 g; Refill: 0  Other orders -     Aspirin -Acetaminophen -Caffeine ; Take 2 tablets by mouth every 6 (six) hours as needed for headache.  Dispense: 30 tablet; Refill: 0     Return in about 3 months (around 04/30/2025).   Bascom GORMAN Borer, NP 01/31/2025     [1]  Allergies Allergen Reactions   Acyclovir  And Related Other (See Comments)    Patient states it made me feel like I can't breath    Bee Pollen Itching and Cough     coughing   Citric Acid Other (See Comments)    AVOIDS DUE TO INTERSTITIAL CYSTITIS   Dust Mite Extract Itching and Cough     coughing   Grapeseed Extract [Nutritional Supplements] Itching    Throat itching and burning   Pineapple Itching    Burning in mouth and tingling lips   Pollen Extract Itching and Other (See Comments)     coughing    Nickel Rash  [2]  Social History Tobacco Use  Smoking Status Every Day   Types: Cigars  Smokeless Tobacco Never  Tobacco Comments   Smokes 1 black and mild occasionally   "

## 2025-02-01 LAB — COMPREHENSIVE METABOLIC PANEL WITH GFR
ALT: 23 [IU]/L (ref 0–32)
AST: 20 [IU]/L (ref 0–40)
Albumin: 4.3 g/dL (ref 3.9–4.9)
Alkaline Phosphatase: 67 [IU]/L (ref 41–116)
BUN/Creatinine Ratio: 9 (ref 9–23)
BUN: 6 mg/dL (ref 6–24)
Bilirubin Total: 0.6 mg/dL (ref 0.0–1.2)
CO2: 21 mmol/L (ref 20–29)
Calcium: 9.7 mg/dL (ref 8.7–10.2)
Chloride: 104 mmol/L (ref 96–106)
Creatinine, Ser: 0.69 mg/dL (ref 0.57–1.00)
Globulin, Total: 2.7 g/dL (ref 1.5–4.5)
Glucose: 159 mg/dL — ABNORMAL HIGH (ref 70–99)
Potassium: 3.7 mmol/L (ref 3.5–5.2)
Sodium: 140 mmol/L (ref 134–144)
Total Protein: 7 g/dL (ref 6.0–8.5)
eGFR: 107 mL/min/{1.73_m2}

## 2025-02-01 LAB — CBC
Hematocrit: 43.2 % (ref 34.0–46.6)
Hemoglobin: 13.9 g/dL (ref 11.1–15.9)
MCH: 29 pg (ref 26.6–33.0)
MCHC: 32.2 g/dL (ref 31.5–35.7)
MCV: 90 fL (ref 79–97)
Platelets: 312 10*3/uL (ref 150–450)
RBC: 4.79 x10E6/uL (ref 3.77–5.28)
RDW: 13.2 % (ref 11.7–15.4)
WBC: 5.8 10*3/uL (ref 3.4–10.8)

## 2025-02-01 LAB — LIPID PANEL
Chol/HDL Ratio: 3.8 ratio (ref 0.0–4.4)
Cholesterol, Total: 188 mg/dL (ref 100–199)
HDL: 49 mg/dL
LDL Chol Calc (NIH): 116 mg/dL — ABNORMAL HIGH (ref 0–99)
Triglycerides: 128 mg/dL (ref 0–149)
VLDL Cholesterol Cal: 23 mg/dL (ref 5–40)

## 2025-02-01 LAB — VITAMIN D 25 HYDROXY (VIT D DEFICIENCY, FRACTURES): Vit D, 25-Hydroxy: 12.5 ng/mL — ABNORMAL LOW (ref 30.0–100.0)

## 2025-02-05 ENCOUNTER — Other Ambulatory Visit: Payer: Self-pay

## 2025-02-25 ENCOUNTER — Ambulatory Visit: Payer: Self-pay

## 2025-05-01 ENCOUNTER — Ambulatory Visit: Payer: Self-pay | Admitting: Nurse Practitioner

## 2025-07-04 ENCOUNTER — Ambulatory Visit: Payer: Self-pay
